# Patient Record
Sex: Female | Born: 1945
Health system: Southern US, Community
[De-identification: ages and names within clinical notes are randomized; demographics above are authoritative.]

## PROBLEM LIST (undated history)

## (undated) DIAGNOSIS — Z9889 Other specified postprocedural states: Secondary | ICD-10-CM

## (undated) DIAGNOSIS — I255 Ischemic cardiomyopathy: Secondary | ICD-10-CM

## (undated) DIAGNOSIS — I219 Acute myocardial infarction, unspecified: Secondary | ICD-10-CM

## (undated) DIAGNOSIS — E039 Hypothyroidism, unspecified: Secondary | ICD-10-CM

## (undated) DIAGNOSIS — I5189 Other ill-defined heart diseases: Secondary | ICD-10-CM

## (undated) DIAGNOSIS — C541 Malignant neoplasm of endometrium: Secondary | ICD-10-CM

## (undated) DIAGNOSIS — G4733 Obstructive sleep apnea (adult) (pediatric): Secondary | ICD-10-CM

## (undated) DIAGNOSIS — R112 Nausea with vomiting, unspecified: Secondary | ICD-10-CM

## (undated) DIAGNOSIS — C73 Malignant neoplasm of thyroid gland: Secondary | ICD-10-CM

## (undated) DIAGNOSIS — I10 Essential (primary) hypertension: Secondary | ICD-10-CM

## (undated) DIAGNOSIS — E785 Hyperlipidemia, unspecified: Secondary | ICD-10-CM

## (undated) DIAGNOSIS — Z789 Other specified health status: Secondary | ICD-10-CM

## (undated) DIAGNOSIS — I119 Hypertensive heart disease without heart failure: Secondary | ICD-10-CM

## (undated) DIAGNOSIS — E119 Type 2 diabetes mellitus without complications: Secondary | ICD-10-CM

## (undated) DIAGNOSIS — I2510 Atherosclerotic heart disease of native coronary artery without angina pectoris: Secondary | ICD-10-CM

## (undated) DIAGNOSIS — Z9989 Dependence on other enabling machines and devices: Secondary | ICD-10-CM

## (undated) DIAGNOSIS — M1990 Unspecified osteoarthritis, unspecified site: Secondary | ICD-10-CM

## (undated) DIAGNOSIS — Z780 Asymptomatic menopausal state: Secondary | ICD-10-CM

## (undated) DIAGNOSIS — F32A Depression, unspecified: Secondary | ICD-10-CM

## (undated) HISTORY — PX: ABDOMINAL HYSTERECTOMY: SHX81

## (undated) HISTORY — PX: GANGLION CYST EXCISION: SHX1691

## (undated) HISTORY — DX: Hypothyroidism, unspecified: E03.9

## (undated) HISTORY — DX: Asymptomatic menopausal state: Z78.0

## (undated) HISTORY — DX: Ischemic cardiomyopathy: I25.5

## (undated) HISTORY — DX: Atherosclerotic heart disease of native coronary artery without angina pectoris: I25.10

## (undated) HISTORY — PX: LAPAROSCOPIC HYSTERECTOMY: SHX1926

## (undated) HISTORY — PX: THYROIDECTOMY: SHX17

## (undated) HISTORY — DX: Hyperlipidemia, unspecified: E78.5

## (undated) HISTORY — DX: Dependence on other enabling machines and devices: Z99.89

## (undated) HISTORY — PX: CARDIAC CATHETERIZATION: SHX172

## (undated) HISTORY — DX: Unspecified osteoarthritis, unspecified site: M19.90

## (undated) HISTORY — DX: Essential (primary) hypertension: I10

## (undated) HISTORY — PX: CORONARY ANGIOPLASTY: SHX604

## (undated) HISTORY — DX: Hypertensive heart disease without heart failure: I11.9

## (undated) HISTORY — DX: Other specified health status: Z78.9

## (undated) HISTORY — DX: Obstructive sleep apnea (adult) (pediatric): G47.33

## (undated) HISTORY — DX: Malignant neoplasm of endometrium: C54.1

---

## 1978-03-01 HISTORY — PX: GANGLION CYST EXCISION: SHX1691

## 1993-03-01 HISTORY — PX: THYROIDECTOMY: SHX17

## 2008-04-12 ENCOUNTER — Ambulatory Visit: Payer: Self-pay | Admitting: Internal Medicine

## 2008-04-29 ENCOUNTER — Ambulatory Visit: Payer: Self-pay | Admitting: Internal Medicine

## 2009-07-02 ENCOUNTER — Ambulatory Visit: Payer: Self-pay | Admitting: Internal Medicine

## 2012-12-26 ENCOUNTER — Ambulatory Visit: Payer: Self-pay | Admitting: Family Medicine

## 2012-12-28 ENCOUNTER — Ambulatory Visit: Payer: Self-pay | Admitting: Gynecologic Oncology

## 2013-01-09 ENCOUNTER — Ambulatory Visit: Payer: Self-pay | Admitting: Gynecologic Oncology

## 2013-01-29 ENCOUNTER — Ambulatory Visit: Payer: Self-pay | Admitting: Gynecologic Oncology

## 2013-02-06 ENCOUNTER — Ambulatory Visit: Payer: Self-pay | Admitting: Obstetrics and Gynecology

## 2013-02-06 LAB — CBC
MCHC: 33.9 g/dL (ref 32.0–36.0)
MCV: 91 fL (ref 80–100)
RBC: 3.93 10*6/uL (ref 3.80–5.20)
WBC: 9.8 10*3/uL (ref 3.6–11.0)

## 2013-02-06 LAB — BASIC METABOLIC PANEL
Anion Gap: 4 — ABNORMAL LOW (ref 7–16)
Chloride: 105 mmol/L (ref 98–107)
EGFR (Non-African Amer.): 51 — ABNORMAL LOW
Osmolality: 282 (ref 275–301)
Sodium: 138 mmol/L (ref 136–145)

## 2013-02-13 ENCOUNTER — Inpatient Hospital Stay: Payer: Self-pay | Admitting: Gynecologic Oncology

## 2013-02-14 LAB — HEMOGLOBIN: HGB: 11.6 g/dL — ABNORMAL LOW (ref 12.0–16.0)

## 2013-03-01 ENCOUNTER — Ambulatory Visit: Payer: Self-pay | Admitting: Gynecologic Oncology

## 2013-08-14 DIAGNOSIS — I2109 ST elevation (STEMI) myocardial infarction involving other coronary artery of anterior wall: Secondary | ICD-10-CM

## 2013-08-14 HISTORY — PX: CORONARY ANGIOPLASTY WITH STENT PLACEMENT: SHX49

## 2013-08-14 HISTORY — DX: ST elevation (STEMI) myocardial infarction involving other coronary artery of anterior wall: I21.09

## 2013-08-30 ENCOUNTER — Encounter: Payer: Self-pay | Admitting: Cardiovascular Disease

## 2013-08-30 ENCOUNTER — Ambulatory Visit (INDEPENDENT_AMBULATORY_CARE_PROVIDER_SITE_OTHER): Payer: Medicare Other | Admitting: Cardiovascular Disease

## 2013-08-30 VITALS — BP 110/68 | HR 46 | Ht 66.5 in | Wt 294.0 lb

## 2013-08-30 DIAGNOSIS — Z9989 Dependence on other enabling machines and devices: Secondary | ICD-10-CM

## 2013-08-30 DIAGNOSIS — I2109 ST elevation (STEMI) myocardial infarction involving other coronary artery of anterior wall: Secondary | ICD-10-CM

## 2013-08-30 DIAGNOSIS — E1121 Type 2 diabetes mellitus with diabetic nephropathy: Secondary | ICD-10-CM | POA: Insufficient documentation

## 2013-08-30 DIAGNOSIS — Z9861 Coronary angioplasty status: Secondary | ICD-10-CM

## 2013-08-30 DIAGNOSIS — E1165 Type 2 diabetes mellitus with hyperglycemia: Secondary | ICD-10-CM

## 2013-08-30 DIAGNOSIS — Z955 Presence of coronary angioplasty implant and graft: Secondary | ICD-10-CM | POA: Insufficient documentation

## 2013-08-30 DIAGNOSIS — E1169 Type 2 diabetes mellitus with other specified complication: Secondary | ICD-10-CM | POA: Insufficient documentation

## 2013-08-30 DIAGNOSIS — I2510 Atherosclerotic heart disease of native coronary artery without angina pectoris: Secondary | ICD-10-CM | POA: Insufficient documentation

## 2013-08-30 DIAGNOSIS — IMO0002 Reserved for concepts with insufficient information to code with codable children: Secondary | ICD-10-CM

## 2013-08-30 DIAGNOSIS — I252 Old myocardial infarction: Secondary | ICD-10-CM | POA: Insufficient documentation

## 2013-08-30 DIAGNOSIS — E118 Type 2 diabetes mellitus with unspecified complications: Secondary | ICD-10-CM

## 2013-08-30 DIAGNOSIS — G4733 Obstructive sleep apnea (adult) (pediatric): Secondary | ICD-10-CM | POA: Insufficient documentation

## 2013-08-30 DIAGNOSIS — E785 Hyperlipidemia, unspecified: Secondary | ICD-10-CM

## 2013-08-30 MED ORDER — METOPROLOL TARTRATE 25 MG PO TABS: 25.0000 mg | ORAL_TABLET | Freq: Two times a day (BID) | ORAL | Status: DC

## 2013-08-30 MED ORDER — ATORVASTATIN CALCIUM 20 MG PO TABS: 20.0000 mg | ORAL_TABLET | Freq: Every day | ORAL | Status: DC

## 2013-08-30 MED ORDER — PRASUGREL HCL 10 MG PO TABS: 10.0000 mg | ORAL_TABLET | Freq: Every day | ORAL | Status: DC

## 2013-08-30 NOTE — Assessment & Plan Note (Signed)
We have encouraged continued exercise, careful diet management in an effort to lose weight. 

## 2013-08-30 NOTE — Assessment & Plan Note (Addendum)
She is interested in generic alternatives. We will start Lipitor 20 g daily, suggested she hold her Crestor when this runs out. Goal LDL less than 70

## 2013-08-30 NOTE — Assessment & Plan Note (Addendum)
Occluded proximal LAD, status post DES. We'll continue aspirin and effient She has bradycardia on today's visit. Heart rate 47 beats per minute. We will decrease her metoprolol down to 12.5 mg twice a day

## 2013-08-30 NOTE — Assessment & Plan Note (Addendum)
We have placed an order for her to enroll in cardiac rehabilitation. Dietary guide has been given today

## 2013-08-30 NOTE — Progress Notes (Addendum)
Patient ID: [name], female    DOB: 1945-12-01, 68 y.o.   MRN: [id number]  HPI Comments: [name] is a pleasant 68 year old woman with history of type 2 diabetes, obesity, obstructive sleep apnea on CPAP, depression, hyperlipidemia who was recently on vacation heading to [place] when she developed bilateral shoulder pain, nausea vomiting with shortness of breath. She presented to a local hospital and was transferred to [facility] in [place] with ST elevation MI. She presents to establish care in the [place] office.  She was taken to the cardiac catheterization lab 08/14/2013 and found to have occluded proximal LAD, no significant disease of her RCA and mid circumflex. She had a 3.5 x 23 mm DES placed. Ejection fraction at that time was 45%. He was discharged on aspirin, effient, beta blocker and statin.  EKG shows sinus bradycardia with T wave abnormality in the anterior precordial leads, 1 and aVL   Outpatient Encounter Prescriptions as of 08/30/2013  Medication Sig  . aspirin 81 MG tablet Take 81 mg by mouth daily.  Marland Kitchen FLUoxetine (PROZAC) 10 MG capsule Take 10 mg by mouth daily.  . Insulin Glargine (LANTUS) 100 UNIT/ML Solostar Pen Inject 18 Units into the skin daily at 10 pm.  . levothyroxine (SYNTHROID, LEVOTHROID) 200 MCG tablet Take 200 mcg by mouth daily before breakfast.  . losartan-hydrochlorothiazide (HYZAAR) 100-25 MG per tablet Take 1 tablet by mouth daily.  . metoprolol tartrate (LOPRESSOR) 25 MG tablet Take 1 tablet (25 mg total) by mouth 2 (two) times daily.  . nitroGLYCERIN (NITROSTAT) 0.4 MG SL tablet Place 0.4 mg under the tongue every 5 (five) minutes as needed for chest pain.  . prasugrel (EFFIENT) 10 MG TABS tablet Take 1 tablet (10 mg total) by mouth daily.  . rosuvastatin (CRESTOR) 10 MG tablet Take 10 mg by mouth daily.  . sitaGLIPtin (JANUVIA) 100 MG tablet Take 100 mg by mouth daily.  . [DISCONTINUED] metoprolol tartrate (LOPRESSOR) 25 MG tablet  Take 25 mg by mouth 2 (two) times daily.  . [DISCONTINUED] prasugrel (EFFIENT) 10 MG TABS tablet Take 10 mg by mouth daily.  Marland Kitchen atorvastatin (LIPITOR) 20 MG tablet Take 1 tablet (20 mg total) by mouth daily.  . traMADol (ULTRAM) 50 MG tablet Take 1 tablet (50 mg total) by mouth every 6 (six) hours as needed.     Review of Systems  Constitutional: Negative.   HENT: Negative.   Eyes: Negative.   Respiratory: Negative.   Cardiovascular: Negative.   Gastrointestinal: Negative.   Endocrine: Negative.   Musculoskeletal: Negative.   Skin: Negative.   Allergic/Immunologic: Negative.   Neurological: Negative.   Hematological: Negative.   Psychiatric/Behavioral: Negative.   All other systems reviewed and are negative.   BP 110/68  Pulse 46  Ht 5' 6.5" (1.689 m)  Wt 294 lb (133.358 kg)  BMI 46.75 kg/m2  Physical Exam  Nursing note and vitals reviewed. Constitutional: She is oriented to person, place, and time. She appears well-developed and well-nourished.  HENT:  Head: Normocephalic.  Nose: Nose normal.  Mouth/Throat: Oropharynx is clear and moist.  Eyes: Conjunctivae are normal. Pupils are equal, round, and reactive to light.  Neck: Normal range of motion. Neck supple. No JVD present.  Cardiovascular: Normal rate, regular rhythm, S1 normal, S2 normal, normal heart sounds and intact distal pulses.  Exam reveals no gallop and no friction rub.   No murmur heard. Pulmonary/Chest: Effort normal and breath sounds normal. No respiratory distress. She has no wheezes. She  has no rales. She exhibits no tenderness.  Abdominal: Soft. Bowel sounds are normal. She exhibits no distension. There is no tenderness.  Musculoskeletal: Normal range of motion. She exhibits no edema and no tenderness.  Lymphadenopathy:    She has no cervical adenopathy.  Neurological: She is alert and oriented to person, place, and time. Coordination normal.  Skin: Skin is warm and dry. No rash noted. No erythema.   Psychiatric: She has a normal mood and affect. Her behavior is normal. Judgment and thought content normal.    Assessment and Plan

## 2013-08-30 NOTE — Assessment & Plan Note (Signed)
3.5 x 23 mm DES to the proximal LAD.

## 2013-08-30 NOTE — Assessment & Plan Note (Signed)
Encouraged her to stay on her CPAP

## 2013-08-30 NOTE — Assessment & Plan Note (Signed)
Peak troponin was 98.4. Does not appear that she had echocardiogram done while in the hospital. [facility] consider echocardiogram in followup establish baseline ejection fraction.

## 2013-08-30 NOTE — Patient Instructions (Signed)
You are doing well. Please hold the crestor Start atorvastatin 1/2 pill once a day for a few weeks, then a full pill if no side effects  Please cut the metoprolol in 1/2 twice a day  We will help set up cardiac rehab at [facility]  Please call us if you have new issues that need to be addressed before your next appt.  Your physician wants you to follow-up in: 3 months.  You will receive a reminder letter in the mail two months in advance. If you don't receive a letter, please call our office to schedule the follow-up appointment.

## 2013-09-03 ENCOUNTER — Other Ambulatory Visit: Payer: Self-pay

## 2013-09-04 ENCOUNTER — Encounter: Payer: Self-pay | Admitting: Cardiovascular Disease

## 2013-09-04 ENCOUNTER — Other Ambulatory Visit: Payer: Self-pay | Admitting: *Deleted

## 2013-09-04 MED ORDER — PRASUGREL HCL 10 MG PO TABS: 10.0000 mg | ORAL_TABLET | Freq: Every day | ORAL | Status: DC

## 2013-09-04 MED ORDER — METOPROLOL TARTRATE 25 MG PO TABS: 25.0000 mg | ORAL_TABLET | Freq: Two times a day (BID) | ORAL | Status: DC

## 2013-09-05 ENCOUNTER — Other Ambulatory Visit: Payer: Self-pay

## 2013-09-06 ENCOUNTER — Telehealth: Payer: Self-pay

## 2013-09-06 NOTE — Telephone Encounter (Signed)
Spoke w/ pt.  She reports that at her ov 08/30/13, she was told that rx for tramadol would be sent in for her until she could obtain a new PCP, as Dr. Ellene Route is leaving.  Advised her that there is no documentation of this, but would speak w/ Dr. Rockey Situ.  Please advise.  Thank you.

## 2013-09-07 MED ORDER — TRAMADOL HCL 50 MG PO TABS: 50.0000 mg | ORAL_TABLET | Freq: Four times a day (QID) | ORAL | Status: DC | PRN

## 2013-09-07 NOTE — Telephone Encounter (Signed)
We can provide a prescription for several weeks' time until she is able to get established with primary care At which point they should take over the prescription #60, 0 refills Take q6 prn for pain Would document where her pain is I seem to remember it was back and knee pain

## 2013-09-07 NOTE — Telephone Encounter (Signed)
Spoke w/ pt.  She is very [employer].  Reports she has an appt next month w/ new PCP.  She will call if we can be of further assistance.

## 2013-09-29 ENCOUNTER — Encounter: Payer: Self-pay | Admitting: Cardiovascular Disease

## 2013-10-30 ENCOUNTER — Encounter: Payer: Self-pay | Admitting: Cardiovascular Disease

## 2013-11-30 ENCOUNTER — Telehealth: Payer: Self-pay

## 2013-11-30 ENCOUNTER — Encounter: Payer: Self-pay | Admitting: Cardiovascular Disease

## 2013-11-30 ENCOUNTER — Ambulatory Visit (INDEPENDENT_AMBULATORY_CARE_PROVIDER_SITE_OTHER): Payer: Medicare Other | Admitting: Cardiovascular Disease

## 2013-11-30 VITALS — BP 108/68 | HR 50 | Ht 67.5 in | Wt 283.5 lb

## 2013-11-30 DIAGNOSIS — E1165 Type 2 diabetes mellitus with hyperglycemia: Secondary | ICD-10-CM

## 2013-11-30 DIAGNOSIS — I2102 ST elevation (STEMI) myocardial infarction involving left anterior descending coronary artery: Secondary | ICD-10-CM

## 2013-11-30 DIAGNOSIS — E785 Hyperlipidemia, unspecified: Secondary | ICD-10-CM

## 2013-11-30 DIAGNOSIS — Z9989 Dependence on other enabling machines and devices: Secondary | ICD-10-CM

## 2013-11-30 DIAGNOSIS — I2510 Atherosclerotic heart disease of native coronary artery without angina pectoris: Secondary | ICD-10-CM

## 2013-11-30 DIAGNOSIS — E118 Type 2 diabetes mellitus with unspecified complications: Secondary | ICD-10-CM

## 2013-11-30 DIAGNOSIS — G4733 Obstructive sleep apnea (adult) (pediatric): Secondary | ICD-10-CM

## 2013-11-30 DIAGNOSIS — Z955 Presence of coronary angioplasty implant and graft: Secondary | ICD-10-CM

## 2013-11-30 DIAGNOSIS — IMO0002 Reserved for concepts with insufficient information to code with codable children: Secondary | ICD-10-CM

## 2013-11-30 DIAGNOSIS — R0602 Shortness of breath: Secondary | ICD-10-CM

## 2013-11-30 MED ORDER — CARVEDILOL 3.125 MG PO TABS: 3.1250 mg | ORAL_TABLET | Freq: Two times a day (BID) | ORAL | Status: DC

## 2013-11-30 NOTE — Progress Notes (Signed)
Patient ID: [name], female    DOB: 1945/10/27, 68 y.o.   MRN: [id number]  HPI Comments: [name] is a pleasant 68 year old woman with history of type 2 diabetes, obesity, obstructive sleep apnea on CPAP, depression, hyperlipidemia who was recently on vacation heading to [place] when she developed bilateral shoulder pain, nausea vomiting with shortness of breath. She presented to a local hospital and was transferred to [facility] in [place] with ST elevation MI. She presents for routine followup Patient of Dr. Luan Pulling  She was taken to the cardiac catheterization lab 08/14/2013 and found to have occluded proximal LAD, no significant disease of her RCA and mid circumflex. She had a 3.5 x 23 mm DES placed. Ejection fraction at that time was 45%. He was discharged on aspirin, effient, beta blocker and statin.  In followup today she is doing well, active, participating in aerobic activity. She does "[employer]" on a regular basis Blood pressure at home typically in the 120s over 80, heart rates in the 50s She is tolerating Crestor. Most recent total cholesterol 175 On her last clinic visit, metoprolol dose was decreased down to 12.5 mg twice a day. Interestingly she reports her heart rate is higher at home. Are track also shows heart rates typically 80s to 90s range, occasionally in the 50s, likely at rest  She reports HCTZ was recently held for climb in her creatinine  EKG shows sinus bradycardia with rate 50 beats per minute with T wave abnormality in the anterior precordial leads, 1 and aVL   Outpatient Encounter Prescriptions as of 11/30/2013  Medication Sig  . aspirin 81 MG tablet Take 81 mg by mouth daily.  Marland Kitchen FLUoxetine (PROZAC) 10 MG capsule Take 10 mg by mouth daily.  . Insulin Glargine (LANTUS) 100 UNIT/ML Solostar Pen Inject 28 Units into the skin daily at 10 pm.   . levothyroxine (SYNTHROID, LEVOTHROID) 175 MCG tablet Take 175 mcg by mouth daily before breakfast.  .  losartan (COZAAR) 100 MG tablet Take 100 mg by mouth daily.  . metoprolol tartrate (LOPRESSOR) 25 MG tablet Take 12.5 mg by mouth 2 (two) times daily.  . nitroGLYCERIN (NITROSTAT) 0.4 MG SL tablet Place 0.4 mg under the tongue every 5 (five) minutes as needed for chest pain.  . prasugrel (EFFIENT) 10 MG TABS tablet Take 1 tablet (10 mg total) by mouth daily.  . rosuvastatin (CRESTOR) 10 MG tablet Take 10 mg by mouth daily.  . sitaGLIPtin (JANUVIA) 100 MG tablet Take 100 mg by mouth daily.    Review of Systems  Constitutional: Negative.   HENT: Negative.   Eyes: Negative.   Respiratory: Negative.   Cardiovascular: Negative.   Gastrointestinal: Negative.   Endocrine: Negative.   Musculoskeletal: Negative.   Skin: Negative.   Allergic/Immunologic: Negative.   Neurological: Negative.   Hematological: Negative.   Psychiatric/Behavioral: Negative.   All other systems reviewed and are negative.   BP 108/68  Pulse 50  Ht 5' 7.5" (1.715 m)  Wt 283 lb 8 oz (128.595 kg)  BMI 43.72 kg/m2  Physical Exam  Nursing note and vitals reviewed. Constitutional: She is oriented to person, place, and time. She appears well-developed and well-nourished.  HENT:  Head: Normocephalic.  Nose: Nose normal.  Mouth/Throat: Oropharynx is clear and moist.  Eyes: Conjunctivae are normal. Pupils are equal, round, and reactive to light.  Neck: Normal range of motion. Neck supple. No JVD present.  Cardiovascular: Normal rate, regular rhythm, S1 normal, S2 normal, normal  heart sounds and intact distal pulses.  Exam reveals no gallop and no friction rub.   No murmur heard. Pulmonary/Chest: Effort normal and breath sounds normal. No respiratory distress. She has no wheezes. She has no rales. She exhibits no tenderness.  Abdominal: Soft. Bowel sounds are normal. She exhibits no distension. There is no tenderness.  Musculoskeletal: Normal range of motion. She exhibits no edema and no tenderness.  Lymphadenopathy:     She has no cervical adenopathy.  Neurological: She is alert and oriented to person, place, and time. Coordination normal.  Skin: Skin is warm and dry. No rash noted. No erythema.  Psychiatric: She has a normal mood and affect. Her behavior is normal. Judgment and thought content normal.    Assessment and Plan

## 2013-11-30 NOTE — Telephone Encounter (Signed)
Faxed clearance for pt to participate in [facility].  Per Christell Faith, PA, "CAD s/p MI 03/2013, s/p PCI/DES to pLAD, HTN, OSA on CPAP, DM2, hypothyroidism, HLD, OA."

## 2013-11-30 NOTE — Patient Instructions (Signed)
You are doing well.  Please hold the metoprolol Start coreg 3.125 mg twice a day Monitor your blood pressure and heart rate  Goal total cholesterol is <150 LDL <70  Will will schedule an echo for hx of heart attack  Please call us if you have new issues that need to be addressed before your next appt.  Your physician wants you to follow-up in: 12 months.  You will receive a reminder letter in the mail two months in advance. If you don't receive a letter, please call our office to schedule the follow-up appointment.

## 2013-12-02 NOTE — Assessment & Plan Note (Signed)
Recommended that she stay on her aspirin and effient. Samples provided today

## 2013-12-02 NOTE — Assessment & Plan Note (Addendum)
She will follow up with Dr. Luan Pulling for routine blood work Goal LDL less than 70. May need higher Crestor dosing.

## 2013-12-02 NOTE — Assessment & Plan Note (Addendum)
Currently with no symptoms of angina. No further workup at this time. Continue current medication regimen. In discussions with Dr. Luan Pulling, the suggestion of changing to carvedilol was may. This is certainly a good option and we will hold her metoprolol, start carvedilol 3.125 mg twice a day with slow titration upwards as tolerated. Significant bradycardia in supine but better heart rates at rehabilitation

## 2013-12-02 NOTE — Assessment & Plan Note (Signed)
Stressed the importance of daily walking as she is doing, weight loss

## 2013-12-02 NOTE — Assessment & Plan Note (Signed)
We have encouraged continued exercise, careful diet management in an effort to lose weight. 

## 2013-12-02 NOTE — Assessment & Plan Note (Signed)
This was not discussed with her today. Prior notes indicating she is on CPAP

## 2013-12-14 ENCOUNTER — Other Ambulatory Visit: Payer: Self-pay

## 2013-12-14 ENCOUNTER — Other Ambulatory Visit (INDEPENDENT_AMBULATORY_CARE_PROVIDER_SITE_OTHER): Payer: Medicare Other

## 2013-12-14 DIAGNOSIS — I2102 ST elevation (STEMI) myocardial infarction involving left anterior descending coronary artery: Secondary | ICD-10-CM

## 2013-12-14 DIAGNOSIS — R0602 Shortness of breath: Secondary | ICD-10-CM

## 2014-03-26 DIAGNOSIS — E890 Postprocedural hypothyroidism: Secondary | ICD-10-CM | POA: Diagnosis not present

## 2014-03-26 DIAGNOSIS — I10 Essential (primary) hypertension: Secondary | ICD-10-CM | POA: Diagnosis not present

## 2014-03-26 DIAGNOSIS — E119 Type 2 diabetes mellitus without complications: Secondary | ICD-10-CM | POA: Diagnosis not present

## 2014-03-26 DIAGNOSIS — F329 Major depressive disorder, single episode, unspecified: Secondary | ICD-10-CM | POA: Diagnosis not present

## 2014-03-26 DIAGNOSIS — M179 Osteoarthritis of knee, unspecified: Secondary | ICD-10-CM | POA: Diagnosis not present

## 2014-03-26 LAB — HEMOGLOBIN A1C: Hgb A1c MFr Bld: 7.5 % — AB (ref 4.0–6.0)

## 2014-03-26 LAB — TSH: TSH: 1.1 u[IU]/mL (ref <=5.90)

## 2014-03-28 DIAGNOSIS — I10 Essential (primary) hypertension: Secondary | ICD-10-CM | POA: Diagnosis not present

## 2014-03-28 DIAGNOSIS — E890 Postprocedural hypothyroidism: Secondary | ICD-10-CM | POA: Diagnosis not present

## 2014-04-02 DIAGNOSIS — M25561 Pain in right knee: Secondary | ICD-10-CM | POA: Diagnosis not present

## 2014-04-05 DIAGNOSIS — M25561 Pain in right knee: Secondary | ICD-10-CM | POA: Diagnosis not present

## 2014-04-10 DIAGNOSIS — M25561 Pain in right knee: Secondary | ICD-10-CM | POA: Diagnosis not present

## 2014-04-12 DIAGNOSIS — M25561 Pain in right knee: Secondary | ICD-10-CM | POA: Diagnosis not present

## 2014-04-17 DIAGNOSIS — M25561 Pain in right knee: Secondary | ICD-10-CM | POA: Diagnosis not present

## 2014-04-19 DIAGNOSIS — M25561 Pain in right knee: Secondary | ICD-10-CM | POA: Diagnosis not present

## 2014-04-22 DIAGNOSIS — M25561 Pain in right knee: Secondary | ICD-10-CM | POA: Diagnosis not present

## 2014-05-07 DIAGNOSIS — M25561 Pain in right knee: Secondary | ICD-10-CM | POA: Diagnosis not present

## 2014-05-10 DIAGNOSIS — M25561 Pain in right knee: Secondary | ICD-10-CM | POA: Diagnosis not present

## 2014-05-14 DIAGNOSIS — M25561 Pain in right knee: Secondary | ICD-10-CM | POA: Diagnosis not present

## 2014-05-16 DIAGNOSIS — M25561 Pain in right knee: Secondary | ICD-10-CM | POA: Diagnosis not present

## 2014-06-21 NOTE — Op Note (Signed)
PATIENT NAME:  [name], [name] MR#:  [id number] DATE OF BIRTH:  06-Jun-1945  DATE OF PROCEDURE:  02/13/2013  SURGEON: Jacquelyne Balint, MD.  PREOPERATIVE DIAGNOSIS: Adenocarcinoma of the endometrium.   POSTOPERATIVE DIAGNOSIS: Adenocarcinoma of the endometrium, probably stage I.  PROCEDURE PERFORMED: Laparoscopy with robotic assistance for total abdominal hysterectomy with bilateral salpingo-oophorectomy.   ANESTHESIA: General.   ESTIMATED BLOOD LOSS: 150 mL.   COMPLICATIONS: None.   INDICATION FOR SURGERY: Ms. [name] is a 69 year old patient, who presented with postmenopausal bleeding and on evaluation was noted to have a well-differentiated endometrium adenocarcinoma. Treatment options were discussed and the decision was made to proceed with surgery.   FINDINGS AT TIME OF SURGERY: Normal external genitalia, urethral meatus, urethra, bladder and vagina. Cervix without lesion. Uterus slightly enlarged for age. Normal adnexa. Moderate amount of adhesions in the pelvis. No evidence of the pelvic or abdominal spread. Surgery complicated by the patient's morbid obesity.   OPERATIVE REPORT: After adequate general anesthesia had been obtained, the patient was prepped and draped in ski position. The cervix was visualized, grasped with a single-tooth tenaculum and dilated. A holding stitch was placed. Then the V-Care was inserted into the uterus and around the vagina. A Foley catheter was placed.   Attention was then directed towards the abdomen. A 1 cm incision was made above the umbilicus. The fascia was identified and transected. The peritoneum was identified and entered. Then, the blunt trocar was inserted into the abdominal cavity. Pneumoperitoneum was obtained and inspection revealed the above-mentioned findings. Then under direct vision, two robotic trocars were inserted into the mid abdomen and another one in the left lower quadrant. The assistant port was placed in the right lower quadrant  and a Versa Step trocar was used. The patient was placed in steep Trendelenburg position and the bowel was pushed away from the pelvis. This was only done incompletely due to adhesions and a very thick sigmoid mesentery. The patient was then attached to the robot.   Pelvic cytology was obtained. Then the adhesions between the colon and the pelvic sidewall were lysed. First, the pelvic sidewall was entered on the left side. The round ligament was cauterized and transected. The infundibulopelvic ligament was identified and mobilized. Vessels and ureter were inspected. Then the infundibulopelvic ligament was cauterized and the adnexa were removed close to the uterus. The same procedure was performed on the contralateral side. Then the anterior fold of the peritoneum was incised and the bladder was freed from lower uterine segment, cervix and upper vagina. The posterior peritoneum was incised. Work in the cul-de-sac was difficult due to the thick mesentery. Retraction was necessary with a fan retractor at all times. Then the uterine vessels were identified, cauterized and transected on either side. It was difficult to find the vaginal ring, which finally was possible after adequate dissection had been carried around the entire cervix. The vagina was entered posteriorly and incision was carried all the way around it. Then uterus, tubes and ovaries could be removed through the vagina. The vaginal apex was closed with a running V-Loc suture as well as an interrupted figure-of-eight stitch on the left pelvic vaginal angle. After several more vessels were cauterized,  adequate hemostasis in the pelvis was obtained. Again, the right and the left sidewalls were explored for bleeding.   In view of her morbid obesity and the very thick sigmoid mesentery, decision was made not to proceed with lymphadenectomy. Arista was placed over the areas of dissection. The patient was detached from  the robot. All ports were removed under  direct vision. The camera port was closed with an interrupted figure-of-eight stitch using 0 Vicryl. The subcutaneous tissue of all port sites was reapproximated with 3-0 Vicryl and the skin was closed with Dermabond. 15 mL of Marcaine was injected around the port site.   The patient tolerated the procedure well and was taken to the recovery room in stable condition. Postoperative urine was clear. Lap, sponge, needle, and instrument counts were correct x 2.   ____________________________ Weber Cooks, MD bem:aw D: 02/14/2013 09:55:41 ET T: 02/14/2013 11:12:23 ET JOB#: [id number]  cc: Weber Cooks, MD, <Dictator> Weber Cooks MD ELECTRONICALLY SIGNED 02/20/2013 12:04

## 2014-07-05 DIAGNOSIS — Z008 Encounter for other general examination: Secondary | ICD-10-CM | POA: Diagnosis not present

## 2014-07-11 DIAGNOSIS — I129 Hypertensive chronic kidney disease with stage 1 through stage 4 chronic kidney disease, or unspecified chronic kidney disease: Secondary | ICD-10-CM | POA: Insufficient documentation

## 2014-07-11 DIAGNOSIS — Z8542 Personal history of malignant neoplasm of other parts of uterus: Secondary | ICD-10-CM | POA: Insufficient documentation

## 2014-07-11 DIAGNOSIS — C541 Malignant neoplasm of endometrium: Secondary | ICD-10-CM

## 2014-07-11 DIAGNOSIS — E890 Postprocedural hypothyroidism: Secondary | ICD-10-CM | POA: Insufficient documentation

## 2014-07-11 DIAGNOSIS — N182 Chronic kidney disease, stage 2 (mild): Secondary | ICD-10-CM

## 2014-07-11 DIAGNOSIS — M1710 Unilateral primary osteoarthritis, unspecified knee: Secondary | ICD-10-CM | POA: Insufficient documentation

## 2014-07-11 DIAGNOSIS — M179 Osteoarthritis of knee, unspecified: Secondary | ICD-10-CM | POA: Insufficient documentation

## 2014-07-11 DIAGNOSIS — I2510 Atherosclerotic heart disease of native coronary artery without angina pectoris: Secondary | ICD-10-CM | POA: Insufficient documentation

## 2014-07-11 DIAGNOSIS — F3340 Major depressive disorder, recurrent, in remission, unspecified: Secondary | ICD-10-CM | POA: Insufficient documentation

## 2014-07-11 DIAGNOSIS — M792 Neuralgia and neuritis, unspecified: Secondary | ICD-10-CM | POA: Insufficient documentation

## 2014-08-01 DIAGNOSIS — E890 Postprocedural hypothyroidism: Secondary | ICD-10-CM | POA: Diagnosis not present

## 2014-08-01 DIAGNOSIS — E119 Type 2 diabetes mellitus without complications: Secondary | ICD-10-CM | POA: Diagnosis not present

## 2014-08-02 ENCOUNTER — Ambulatory Visit: Payer: Self-pay | Admitting: Family Medicine

## 2014-08-08 ENCOUNTER — Encounter: Payer: Self-pay | Admitting: Family Medicine

## 2014-08-08 ENCOUNTER — Ambulatory Visit (INDEPENDENT_AMBULATORY_CARE_PROVIDER_SITE_OTHER): Payer: Medicare Other | Admitting: Family Medicine

## 2014-08-08 VITALS — BP 136/72 | HR 56 | Temp 98.1°F | Resp 16 | Ht 67.0 in | Wt 278.0 lb

## 2014-08-08 DIAGNOSIS — N183 Chronic kidney disease, stage 3 unspecified: Secondary | ICD-10-CM | POA: Insufficient documentation

## 2014-08-08 DIAGNOSIS — M1712 Unilateral primary osteoarthritis, left knee: Secondary | ICD-10-CM

## 2014-08-08 DIAGNOSIS — IMO0002 Reserved for concepts with insufficient information to code with codable children: Secondary | ICD-10-CM

## 2014-08-08 DIAGNOSIS — E1165 Type 2 diabetes mellitus with hyperglycemia: Secondary | ICD-10-CM

## 2014-08-08 DIAGNOSIS — I10 Essential (primary) hypertension: Secondary | ICD-10-CM

## 2014-08-08 DIAGNOSIS — N1831 Chronic kidney disease, stage 3a: Secondary | ICD-10-CM

## 2014-08-08 DIAGNOSIS — E785 Hyperlipidemia, unspecified: Secondary | ICD-10-CM | POA: Diagnosis not present

## 2014-08-08 DIAGNOSIS — N182 Chronic kidney disease, stage 2 (mild): Secondary | ICD-10-CM

## 2014-08-08 DIAGNOSIS — E118 Type 2 diabetes mellitus with unspecified complications: Secondary | ICD-10-CM

## 2014-08-08 DIAGNOSIS — E890 Postprocedural hypothyroidism: Secondary | ICD-10-CM | POA: Diagnosis not present

## 2014-08-08 MED ORDER — METFORMIN HCL ER 500 MG PO TB24: 500.0000 mg | ORAL_TABLET | Freq: Every day | ORAL | Status: DC

## 2014-08-08 MED ORDER — TRAMADOL HCL 50 MG PO TABS: 50.0000 mg | ORAL_TABLET | Freq: Every day | ORAL | Status: DC | PRN

## 2014-08-08 MED ORDER — INSULIN GLARGINE 100 UNIT/ML SOLOSTAR PEN: 42.0000 [IU] | PEN_INJECTOR | Freq: Every day | SUBCUTANEOUS | Status: DC

## 2014-08-08 NOTE — Assessment & Plan Note (Signed)
Pt counseled to avoid NSAID's. Monitor GFR closely with addition of Metformin. Pt remains on ACE for renal protection.

## 2014-08-08 NOTE — Assessment & Plan Note (Signed)
A1c increased from 7.5% to 8.0%. Plan to increase Lantus to 42 units nightly and titrate up.  Add back metformin 500mg  once daily to help decrease A1c. Monitor kidney function very closely and d/c if GFR is < 45.  Increase exercise and make diet changes.

## 2014-08-08 NOTE — Assessment & Plan Note (Addendum)
TSH WNL. Continue current dose of synthroid. Recheck in 3 mos.

## 2014-08-08 NOTE — Assessment & Plan Note (Signed)
Encouraged continued exercise. Recommend water aerobics 3 days per week to help with knee issues.   Resources from [email or web address] given to help with food choices.  Goal weight loss is 10 lbs by next visit.

## 2014-08-08 NOTE — Patient Instructions (Addendum)
DM: Increase Lantus to 42 units nightly can titrate up to 2 units for FBG > 150.  Stop at 50 units. Increase exercise: suggest water aerobics or gentle walking. Watch sugar intake and portion control.  We are restarting low dose metformin, we will watch you kidney function closely.  Kidneys: AVOID Advil, ibuprofen, aleve, etc.  Arthritis: Consider Capsaicin cream to the knee for pain or tart cherry juice supplements. These will help with pain and not interact with any medications.

## 2014-08-08 NOTE — Assessment & Plan Note (Signed)
PRN tramadol renewed today. Pt has completed PT but they released her until after she gets a knee replacement. Pt is willing to undergo evaluation for knee replacement after she reaches the 1 year mark on her MI.  Advised capsaicin cream and tart cherry juice as complementary treatment for her OSA of the knee.

## 2014-08-08 NOTE — Progress Notes (Signed)
Subjective:    Patient ID: [name], female    DOB: 20-Sep-1945, 69 y.o.   MRN: [id number]  HPI: [name] is a 69 y.o. female presenting on 08/08/2014 for Diabetes; Hypertension; Hypothyroidism; Arthritis; and Chronic Kidney Disease  Pt presents for chronic condition follow-up. Labs were done prior to her visit. Her kidney function has been fluctuating between 45-50 GFR. She endorses some use of NSAIDs in the recent past.  Hypothyroidism is post-operative. She is doing well on current dose of synthroid. Denies cold intolerance, fatigue, or weight gain.   Diabetes She presents for her follow-up diabetic visit. She has type 2 diabetes mellitus. Her disease course has been worsening. Pertinent negatives for hypoglycemia include no confusion, dizziness or headaches. Pertinent negatives for diabetes include no chest pain, no fatigue, no polydipsia, no polyphagia, no polyuria, no visual change and no weakness. There are no hypoglycemic complications. Diabetic complications include heart disease and nephropathy. Risk factors for coronary artery disease include diabetes mellitus and dyslipidemia. Her weight is stable. She is following a generally healthy diet. Meal planning includes avoidance of concentrated sweets. She rarely participates in exercise. Her breakfast blood glucose range is generally 130-140 mg/dl. Her overall blood glucose range is 130-140 mg/dl. An ACE inhibitor/angiotensin II receptor blocker is being taken. She does not see a podiatrist.Eye exam is not current.  Hypertension This is a chronic problem. The current episode started more than 1 year ago. The problem is unchanged. Pertinent negatives include no chest pain or headaches. Risk factors for coronary artery disease include dyslipidemia and diabetes mellitus. Past treatments include angiotensin blockers. The current treatment provides moderate improvement. Compliance problems include exercise.  Hypertensive end-organ damage  includes kidney disease and CAD/MI.  Arthritis Presents for follow-up visit. She complains of pain, stiffness and joint swelling. Affected locations include the left knee. Pertinent negatives include no diarrhea, dysuria, fatigue or fever. Her pertinent risk factors include overuse. Past treatments include acetaminophen, rest and exercise. Compliance with prior treatments has been good.  Pt does endorse rare exercise due to her arthritis.    She is mostly compliant with the diabetic diet but endorses she could work on her portion sizing.    Past Medical History  Diagnosis Date  . Coronary artery disease   . Hypertension   . Obstructive sleep apnea on CPAP   . Diabetes mellitus, type II   . Arthritis     knees   . Menopause   . Hypothyroid   . Hyperlipidemia   . Osteoarthritis   . Acute anterolateral wall MI 2015  . Endometrial cancer   . Gravida 3 para 3     Current Outpatient Prescriptions on File Prior to Visit  Medication Sig  . acetaminophen (TYLENOL) 500 MG tablet Take 2 tablets by mouth 3 (three) times daily as needed.  Marland Kitchen aspirin 81 MG tablet Take 81 mg by mouth daily.  . carvedilol (COREG) 3.125 MG tablet Take 1 tablet (3.125 mg total) by mouth 2 (two) times daily.  Marland Kitchen FLUoxetine (PROZAC) 10 MG capsule Take 10 mg by mouth daily.  Marland Kitchen glucose blood test strip 1 strip 3 (three) times daily.  Marland Kitchen levothyroxine (SYNTHROID, LEVOTHROID) 175 MCG tablet Take 175 mcg by mouth daily before breakfast.  . losartan (COZAAR) 100 MG tablet Take 100 mg by mouth daily.  . nitroGLYCERIN (NITROSTAT) 0.4 MG SL tablet Place 0.4 mg under the tongue every 5 (five) minutes as needed for chest pain.  . prasugrel (EFFIENT) 10 MG TABS tablet  Take 1 tablet (10 mg total) by mouth daily.  . rosuvastatin (CRESTOR) 10 MG tablet Take 10 mg by mouth daily.  . sitaGLIPtin (JANUVIA) 100 MG tablet Take 100 mg by mouth daily.   No current facility-administered medications on file prior to visit.    Review of  Systems  Constitutional: Negative for fever, chills and fatigue.  HENT: Negative.   Respiratory: Negative for cough, chest tightness and wheezing.   Cardiovascular: Negative for chest pain and leg swelling.  Gastrointestinal: Negative for nausea, vomiting, abdominal pain, diarrhea and constipation.  Endocrine: Negative.  Negative for cold intolerance, heat intolerance, polydipsia, polyphagia and polyuria.  Genitourinary: Negative for dysuria and difficulty urinating.  Musculoskeletal: Positive for joint swelling, arthralgias, arthritis and stiffness.  Neurological: Negative for dizziness, weakness, light-headedness, numbness and headaches.  Psychiatric/Behavioral: Negative.  Negative for confusion.   Per HPI unless specifically indicated above     Objective:    BP 136/72 mmHg  Pulse 56  Temp(Src) 98.1 F (36.7 C) (Oral)  Resp 16  Ht 5\' 7"  (1.702 m)  Wt 278 lb (126.1 kg)  BMI 43.53 kg/m2  Wt Readings from Last 3 Encounters:  08/08/14 278 lb (126.1 kg)  07/05/14 278 lb 2.1 oz (126.159 kg)  11/30/13 283 lb 8 oz (128.595 kg)    Physical Exam  Constitutional: She is oriented to person, place, and time. She appears well-developed and well-nourished. No distress.  Neck: Normal range of motion. Neck supple. No thyromegaly present.  Cardiovascular: Normal rate and regular rhythm.  Exam reveals no gallop and no friction rub.   No murmur heard. Pulmonary/Chest: Effort normal and breath sounds normal.  Abdominal: Soft. Bowel sounds are normal. There is no tenderness. There is no rebound.  Musculoskeletal: She exhibits no edema.       Left knee: She exhibits decreased range of motion. She exhibits no swelling and no erythema. Tenderness found. Medial joint line and lateral joint line tenderness noted.  Lymphadenopathy:    She has no cervical adenopathy.  Neurological: She is alert and oriented to person, place, and time.  Skin: Skin is warm and dry. She is not diaphoretic.   Results  for orders placed or performed in visit on 07/11/14  Hemoglobin A1c  Result Value Ref Range   Hgb A1c MFr Bld 7.5 (A) 4.0 - 6.0 %  TSH  Result Value Ref Range   TSH 1.10 .41 - 5.90 uIU/mL      Assessment & Plan:   Problem List Items Addressed This Visit      Cardiovascular and Mediastinum   Essential (primary) hypertension     Endocrine   Diabetes mellitus type 2 with complications, uncontrolled - Primary    A1c increased from 7.5% to 8.0%. Plan to increase Lantus to 42 units nightly and titrate up.  Add back metformin 500mg  once daily to help decrease A1c. Monitor kidney function very closely and d/c if GFR is < 45.  Increase exercise and make diet changes.       Relevant Medications   metFORMIN (GLUCOPHAGE XR) 500 MG 24 hr tablet   Insulin Glargine (LANTUS) 100 UNIT/ML Solostar Pen   Hypothyroidism, postop    TSH WNL. Continue current dose of synthroid. Recheck in 3 mos.         Musculoskeletal and Integument   Arthritis of knee, degenerative    PRN tramadol renewed today. Pt has completed PT but they released her until after she gets a knee replacement. Pt is willing to undergo  evaluation for knee replacement after she reaches the 1 year mark on her MI.  Advised capsaicin cream and tart cherry juice as complementary treatment for her OSA of the knee.       Relevant Medications   traMADol (ULTRAM) 50 MG tablet     Genitourinary   CKD stage G3a/A1, GFR 45 - 59 and albumin creatinine ratio <30 mg/g    Pt counseled to avoid NSAID's. Monitor GFR closely with addition of Metformin. Pt remains on ACE for renal protection.         Other   Morbid obesity    Encouraged continued exercise. Recommend water aerobics 3 days per week to help with knee issues.   Resources from [email or web address] given to help with food choices.  Goal weight loss is 10 lbs by next visit.       Relevant Medications   metFORMIN (GLUCOPHAGE XR) 500 MG 24 hr tablet   Insulin Glargine (LANTUS) 100  UNIT/ML Solostar Pen   Hyperlipidemia      Meds ordered this encounter  Medications  . metFORMIN (GLUCOPHAGE XR) 500 MG 24 hr tablet    Sig: Take 1 tablet (500 mg total) by mouth daily with breakfast.    Dispense:  30 tablet    Refill:  11    Order Specific Question:  Supervising Provider    Answer:  Arlis Porta [phone]  . traMADol (ULTRAM) 50 MG tablet    Sig: Take 1 tablet (50 mg total) by mouth daily as needed.    Dispense:  30 tablet    Refill:  1    Order Specific Question:  Supervising Provider    Answer:  Arlis Porta [phone]  . Insulin Glargine (LANTUS) 100 UNIT/ML Solostar Pen    Sig: Inject 42 Units into the skin at bedtime. Can titrate 2 units as needed for fasting BG >150. Stop at 50 units nightly.    Dispense:  5 pen    Refill:  11      Follow up plan: Return in about 3 months (around 11/08/2014) for DM.

## 2014-08-21 ENCOUNTER — Other Ambulatory Visit: Payer: Self-pay | Admitting: Family Medicine

## 2014-08-21 DIAGNOSIS — E785 Hyperlipidemia, unspecified: Secondary | ICD-10-CM

## 2014-08-21 MED ORDER — ROSUVASTATIN CALCIUM 10 MG PO TABS: 10.0000 mg | ORAL_TABLET | Freq: Every day | ORAL | Status: DC

## 2014-09-06 ENCOUNTER — Telehealth: Payer: Self-pay | Admitting: Family Medicine

## 2014-09-06 MED ORDER — SITAGLIPTIN PHOSPHATE 100 MG PO TABS: 100.0000 mg | ORAL_TABLET | Freq: Every day | ORAL | Status: DC

## 2014-09-06 NOTE — Telephone Encounter (Signed)
Pt.notified

## 2014-09-06 NOTE — Telephone Encounter (Signed)
I refilled this medication and sent it to her pharmacy. Not sure why we never received the fax from [facility]

## 2014-09-06 NOTE — Telephone Encounter (Signed)
I don't see in her medication list can we refill this please suggest [facility]

## 2014-09-06 NOTE — Telephone Encounter (Signed)
Ms. Gassner called saying her pharmacy [facility]) told her they faxed a request to refill her Januvia 100 (sp?) last week and they've received no response from [place]. The pt said she's completely out and needs a refill. Please call pt if needed. Pt ph# [phone] Thank you.

## 2014-09-25 ENCOUNTER — Other Ambulatory Visit: Payer: Self-pay | Admitting: Cardiovascular Disease

## 2014-10-21 ENCOUNTER — Other Ambulatory Visit: Payer: Self-pay | Admitting: Family Medicine

## 2014-11-11 DIAGNOSIS — Z794 Long term (current) use of insulin: Secondary | ICD-10-CM | POA: Diagnosis not present

## 2014-11-11 DIAGNOSIS — E890 Postprocedural hypothyroidism: Secondary | ICD-10-CM | POA: Diagnosis not present

## 2014-11-11 DIAGNOSIS — J329 Chronic sinusitis, unspecified: Secondary | ICD-10-CM | POA: Diagnosis not present

## 2014-11-11 DIAGNOSIS — Z85850 Personal history of malignant neoplasm of thyroid: Secondary | ICD-10-CM | POA: Diagnosis not present

## 2014-11-11 DIAGNOSIS — G4733 Obstructive sleep apnea (adult) (pediatric): Secondary | ICD-10-CM | POA: Diagnosis not present

## 2014-11-11 DIAGNOSIS — R001 Bradycardia, unspecified: Secondary | ICD-10-CM | POA: Diagnosis not present

## 2014-11-11 DIAGNOSIS — Z7982 Long term (current) use of aspirin: Secondary | ICD-10-CM | POA: Diagnosis not present

## 2014-11-11 DIAGNOSIS — R51 Headache: Secondary | ICD-10-CM | POA: Diagnosis not present

## 2014-11-11 DIAGNOSIS — E876 Hypokalemia: Secondary | ICD-10-CM | POA: Diagnosis not present

## 2014-11-11 DIAGNOSIS — E785 Hyperlipidemia, unspecified: Secondary | ICD-10-CM | POA: Diagnosis not present

## 2014-11-11 DIAGNOSIS — I2510 Atherosclerotic heart disease of native coronary artery without angina pectoris: Secondary | ICD-10-CM | POA: Diagnosis not present

## 2014-11-11 DIAGNOSIS — F329 Major depressive disorder, single episode, unspecified: Secondary | ICD-10-CM | POA: Diagnosis not present

## 2014-11-11 DIAGNOSIS — I252 Old myocardial infarction: Secondary | ICD-10-CM | POA: Diagnosis not present

## 2014-11-11 DIAGNOSIS — R471 Dysarthria and anarthria: Secondary | ICD-10-CM | POA: Diagnosis not present

## 2014-11-11 DIAGNOSIS — R110 Nausea: Secondary | ICD-10-CM | POA: Diagnosis not present

## 2014-11-11 DIAGNOSIS — Z79899 Other long term (current) drug therapy: Secondary | ICD-10-CM | POA: Diagnosis not present

## 2014-11-11 DIAGNOSIS — R4781 Slurred speech: Secondary | ICD-10-CM | POA: Diagnosis not present

## 2014-11-11 DIAGNOSIS — I639 Cerebral infarction, unspecified: Secondary | ICD-10-CM | POA: Diagnosis not present

## 2014-11-11 DIAGNOSIS — I071 Rheumatic tricuspid insufficiency: Secondary | ICD-10-CM | POA: Diagnosis not present

## 2014-11-11 DIAGNOSIS — G459 Transient cerebral ischemic attack, unspecified: Secondary | ICD-10-CM | POA: Diagnosis not present

## 2014-11-11 DIAGNOSIS — I361 Nonrheumatic tricuspid (valve) insufficiency: Secondary | ICD-10-CM | POA: Diagnosis not present

## 2014-11-11 DIAGNOSIS — Z923 Personal history of irradiation: Secondary | ICD-10-CM | POA: Diagnosis not present

## 2014-11-11 DIAGNOSIS — R29810 Facial weakness: Secondary | ICD-10-CM | POA: Diagnosis not present

## 2014-11-11 DIAGNOSIS — I6521 Occlusion and stenosis of right carotid artery: Secondary | ICD-10-CM | POA: Diagnosis not present

## 2014-11-11 DIAGNOSIS — I517 Cardiomegaly: Secondary | ICD-10-CM | POA: Diagnosis not present

## 2014-11-11 DIAGNOSIS — Z955 Presence of coronary angioplasty implant and graft: Secondary | ICD-10-CM | POA: Diagnosis not present

## 2014-11-11 DIAGNOSIS — E039 Hypothyroidism, unspecified: Secondary | ICD-10-CM | POA: Diagnosis not present

## 2014-11-11 DIAGNOSIS — I10 Essential (primary) hypertension: Secondary | ICD-10-CM | POA: Diagnosis not present

## 2014-11-11 DIAGNOSIS — E119 Type 2 diabetes mellitus without complications: Secondary | ICD-10-CM | POA: Diagnosis not present

## 2014-11-12 DIAGNOSIS — E039 Hypothyroidism, unspecified: Secondary | ICD-10-CM | POA: Diagnosis not present

## 2014-11-12 DIAGNOSIS — R4781 Slurred speech: Secondary | ICD-10-CM | POA: Diagnosis not present

## 2014-11-12 DIAGNOSIS — R29810 Facial weakness: Secondary | ICD-10-CM | POA: Diagnosis not present

## 2014-11-12 DIAGNOSIS — R51 Headache: Secondary | ICD-10-CM | POA: Diagnosis not present

## 2014-11-12 DIAGNOSIS — I2510 Atherosclerotic heart disease of native coronary artery without angina pectoris: Secondary | ICD-10-CM | POA: Diagnosis not present

## 2014-11-12 DIAGNOSIS — I6521 Occlusion and stenosis of right carotid artery: Secondary | ICD-10-CM | POA: Diagnosis not present

## 2014-11-19 ENCOUNTER — Ambulatory Visit (INDEPENDENT_AMBULATORY_CARE_PROVIDER_SITE_OTHER): Payer: Medicare Other | Admitting: Family Medicine

## 2014-11-19 ENCOUNTER — Encounter: Payer: Self-pay | Admitting: Family Medicine

## 2014-11-19 VITALS — BP 122/81 | HR 56 | Temp 98.4°F | Resp 16 | Ht 67.0 in | Wt 272.0 lb

## 2014-11-19 DIAGNOSIS — E785 Hyperlipidemia, unspecified: Secondary | ICD-10-CM | POA: Diagnosis not present

## 2014-11-19 DIAGNOSIS — E890 Postprocedural hypothyroidism: Secondary | ICD-10-CM

## 2014-11-19 DIAGNOSIS — Z23 Encounter for immunization: Secondary | ICD-10-CM | POA: Diagnosis not present

## 2014-11-19 DIAGNOSIS — Z955 Presence of coronary angioplasty implant and graft: Secondary | ICD-10-CM

## 2014-11-19 DIAGNOSIS — IMO0002 Reserved for concepts with insufficient information to code with codable children: Secondary | ICD-10-CM

## 2014-11-19 DIAGNOSIS — I10 Essential (primary) hypertension: Secondary | ICD-10-CM

## 2014-11-19 DIAGNOSIS — I2101 ST elevation (STEMI) myocardial infarction involving left main coronary artery: Secondary | ICD-10-CM

## 2014-11-19 DIAGNOSIS — E118 Type 2 diabetes mellitus with unspecified complications: Secondary | ICD-10-CM

## 2014-11-19 DIAGNOSIS — E1165 Type 2 diabetes mellitus with hyperglycemia: Secondary | ICD-10-CM

## 2014-11-19 DIAGNOSIS — G451 Carotid artery syndrome (hemispheric): Secondary | ICD-10-CM

## 2014-11-19 NOTE — Patient Instructions (Signed)
Continue all of your current meds.  No flying until cleared by Vascular surgery

## 2014-11-19 NOTE — Progress Notes (Signed)
Name: [name]   MRN: [id number]    DOB: Jul 16, 1945   Date:11/19/2014       Progress Note  Subjective  Chief Complaint  Chief Complaint  Patient presents with  . Hospitalization Follow-up    mini stroke while on plan 11/11/2014 taken to ER at [place]    HPI Here for f/u of TIA in [place] last week.  No CT evidence of bleed or infartion.  [place] of carotids showed R carotid with 50-69% stenosis.  She is feeling well at this time.  No more HA.  Reported that she had had some L. Facial droop and L. Sided weakness, but this was minimal.    She hs not seen Dr. Rockey Situ in about 1 year.  No problem-specific assessment & plan notes found for this encounter.   Past Medical History  Diagnosis Date  . Coronary artery disease   . Hypertension   . Obstructive sleep apnea on CPAP   . Diabetes mellitus, type II   . Arthritis     knees   . Menopause   . Hypothyroid   . Hyperlipidemia   . Osteoarthritis   . Acute anterolateral wall MI 2015  . Endometrial cancer   . Gravida 3 para 3     Social History  Substance Use Topics  . Smoking status: Never Smoker   . Smokeless tobacco: Never Used  . Alcohol Use: No     Current outpatient prescriptions:  .  acetaminophen (TYLENOL) 500 MG tablet, Take 2 tablets by mouth 3 (three) times daily as needed., Disp: , Rfl:  .  aspirin 81 MG tablet, Take 81 mg by mouth daily., Disp: , Rfl:  .  B-D ULTRAFINE III SHORT PEN 31G X 8 MM MISC, USE UTD, Disp: , Rfl: 0 .  carvedilol (COREG) 3.125 MG tablet, Take 1 tablet (3.125 mg total) by mouth 2 (two) times daily., Disp: 60 tablet, Rfl: 11 .  EFFIENT 10 MG TABS tablet, TAKE ONE TABLET BY MOUTH EVERY DAY, Disp: 30 tablet, Rfl: 3 .  FLUoxetine (PROZAC) 10 MG capsule, Take 10 mg by mouth daily., Disp: , Rfl:  .  glucose blood test strip, 1 strip 3 (three) times daily., Disp: , Rfl:  .  Insulin Glargine (LANTUS) 100 UNIT/ML Solostar Pen, Inject 42 Units into the skin at bedtime. Can titrate 2 units as needed  for fasting BG >150. Stop at 50 units nightly., Disp: 5 pen, Rfl: 11 .  levothyroxine (SYNTHROID, LEVOTHROID) 175 MCG tablet, TAKE 1 TABLET BY MOUTH EVERY DAY, Disp: 30 tablet, Rfl: 0 .  losartan (COZAAR) 100 MG tablet, Take 100 mg by mouth daily., Disp: , Rfl:  .  metFORMIN (GLUCOPHAGE XR) 500 MG 24 hr tablet, Take 1 tablet (500 mg total) by mouth daily with breakfast., Disp: 30 tablet, Rfl: 11 .  nitroGLYCERIN (NITROSTAT) 0.4 MG SL tablet, Place 0.4 mg under the tongue every 5 (five) minutes as needed for chest pain., Disp: , Rfl:  .  rosuvastatin (CRESTOR) 10 MG tablet, Take 1 tablet (10 mg total) by mouth daily., Disp: 90 tablet, Rfl: 3 .  sitaGLIPtin (JANUVIA) 100 MG tablet, Take 1 tablet (100 mg total) by mouth daily., Disp: 90 tablet, Rfl: 3 .  traMADol (ULTRAM) 50 MG tablet, Take 1 tablet (50 mg total) by mouth daily as needed., Disp: 30 tablet, Rfl: 1  No Known Allergies  Review of Systems  Constitutional: Negative for fever, chills and malaise/fatigue.  Eyes: Negative for blurred vision and double vision.  Respiratory: Negative for cough, sputum production, shortness of breath and wheezing.   Cardiovascular: Negative for chest pain, palpitations, orthopnea and leg swelling.  Gastrointestinal: Negative for heartburn, nausea, vomiting, abdominal pain and diarrhea.  Genitourinary: Negative for dysuria, urgency and frequency.  Musculoskeletal: Negative for myalgias and joint pain.  Neurological: Positive for headaches ( minor now.). Negative for dizziness, tingling, sensory change, focal weakness and weakness.  Psychiatric/Behavioral: The patient has insomnia.       Objective  Filed Vitals:   11/19/14 1310  BP: 122/81  Pulse: 56  Temp: 98.4 F (36.9 C)  TempSrc: Oral  Resp: 16  Height: 5\' 7"  (1.702 m)  Weight: 272 lb (123.378 kg)     Physical Exam  Constitutional: She is oriented to person, place, and time and well-developed, well-nourished, and in no distress. No  distress.  HENT:  Head: Normocephalic and atraumatic.  Eyes: Conjunctivae and EOM are normal. Pupils are equal, round, and reactive to light.  Neck: Normal range of motion. Neck supple. Carotid bruit is not present. No thyromegaly present.  Cardiovascular: Regular rhythm, normal heart sounds and intact distal pulses.  Exam reveals no gallop and no friction rub.   No murmur heard. Pulmonary/Chest: Effort normal and breath sounds normal. No respiratory distress. She has no wheezes. She has no rales.  Abdominal: Soft. She exhibits no distension and no mass. There is no tenderness.  Musculoskeletal: She exhibits no edema.  Lymphadenopathy:    She has no cervical adenopathy.  Neurological: She is alert and oriented to person, place, and time. No cranial nerve deficit. Coordination normal.  Psychiatric: Mood, memory, affect and judgment normal.  Vitals reviewed.     No results found for this or any previous visit (from the past 2160 hour(s)).   Assessment & Plan  1. TIA involving carotid artery  - Ambulatory referral to Vascular Surgery  2. Essential (primary) hypertension  - Comprehensive Metabolic Panel (CMET)  3. ST elevation myocardial infarction involving left main coronary artery   4. Diabetes mellitus type 2 with complications, uncontrolled  - HgB A1c  5. Hypothyroidism, postop  - TSH  6. History of coronary artery stent placement  - Ambulatory referral to Cardiology  7. Hyperlipidemia  - Lipid Profile  8. Need for influenza vaccination  - Flu vaccine HIGH DOSE PF (Fluzone High dose)

## 2014-11-21 DIAGNOSIS — E785 Hyperlipidemia, unspecified: Secondary | ICD-10-CM | POA: Diagnosis not present

## 2014-11-21 DIAGNOSIS — E118 Type 2 diabetes mellitus with unspecified complications: Secondary | ICD-10-CM | POA: Diagnosis not present

## 2014-11-21 DIAGNOSIS — E890 Postprocedural hypothyroidism: Secondary | ICD-10-CM | POA: Diagnosis not present

## 2014-11-21 DIAGNOSIS — I10 Essential (primary) hypertension: Secondary | ICD-10-CM | POA: Diagnosis not present

## 2014-11-22 LAB — LIPID PANEL
CHOL/HDL RATIO: 2.9 ratio (ref 0.0–4.4)
Cholesterol, Total: 160 mg/dL (ref 100–199)
HDL: 56 mg/dL (ref >39)
LDL Calculated: 82 mg/dL (ref 0–99)
Triglycerides: 112 mg/dL (ref 0–149)
VLDL Cholesterol Cal: 22 mg/dL (ref 5–40)

## 2014-11-22 LAB — COMPREHENSIVE METABOLIC PANEL
ALBUMIN: 3.9 g/dL (ref 3.6–4.8)
ALT: 13 IU/L (ref 0–32)
AST: 13 IU/L (ref 0–40)
Albumin/Globulin Ratio: 1.4 (ref 1.1–2.5)
Alkaline Phosphatase: 73 IU/L (ref 39–117)
BUN / CREAT RATIO: 22 (ref 11–26)
BUN: 21 mg/dL (ref 8–27)
Bilirubin Total: 0.3 mg/dL (ref 0.0–1.2)
CHLORIDE: 101 mmol/L (ref 97–108)
CO2: 25 mmol/L (ref 18–29)
Calcium: 8.9 mg/dL (ref 8.7–10.3)
Creatinine, Ser: 0.97 mg/dL (ref 0.57–1.00)
GFR calc non Af Amer: 60 mL/min/{1.73_m2} (ref >59)
GFR, EST AFRICAN AMERICAN: 69 mL/min/{1.73_m2} (ref >59)
Globulin, Total: 2.7 g/dL (ref 1.5–4.5)
Glucose: 123 mg/dL — ABNORMAL HIGH (ref 65–99)
Potassium: 4.5 mmol/L (ref 3.5–5.2)
SODIUM: 139 mmol/L (ref 134–144)
TOTAL PROTEIN: 6.6 g/dL (ref 6.0–8.5)

## 2014-11-22 LAB — TSH: TSH: 0.702 u[IU]/mL (ref 0.450–4.500)

## 2014-11-22 LAB — HEMOGLOBIN A1C
ESTIMATED AVERAGE GLUCOSE: 169 mg/dL
HEMOGLOBIN A1C: 7.5 % — AB (ref 4.8–5.6)

## 2014-11-25 ENCOUNTER — Telehealth: Payer: Self-pay | Admitting: Family Medicine

## 2014-11-25 NOTE — Telephone Encounter (Signed)
Pt return call from  Friday please call pt

## 2014-11-26 DIAGNOSIS — E785 Hyperlipidemia, unspecified: Secondary | ICD-10-CM | POA: Diagnosis not present

## 2014-11-26 DIAGNOSIS — I6529 Occlusion and stenosis of unspecified carotid artery: Secondary | ICD-10-CM | POA: Diagnosis not present

## 2014-11-26 DIAGNOSIS — I2510 Atherosclerotic heart disease of native coronary artery without angina pectoris: Secondary | ICD-10-CM | POA: Diagnosis not present

## 2014-11-26 DIAGNOSIS — I10 Essential (primary) hypertension: Secondary | ICD-10-CM | POA: Diagnosis not present

## 2014-11-26 DIAGNOSIS — G459 Transient cerebral ischemic attack, unspecified: Secondary | ICD-10-CM | POA: Diagnosis not present

## 2014-11-29 ENCOUNTER — Other Ambulatory Visit: Payer: Self-pay

## 2014-11-29 ENCOUNTER — Other Ambulatory Visit: Payer: Self-pay | Admitting: Family Medicine

## 2014-11-29 DIAGNOSIS — E038 Other specified hypothyroidism: Secondary | ICD-10-CM

## 2014-11-29 MED ORDER — LEVOTHYROXINE SODIUM 175 MCG PO TABS: 175.0000 ug | ORAL_TABLET | Freq: Every day | ORAL | Status: DC

## 2014-12-07 ENCOUNTER — Other Ambulatory Visit: Payer: Self-pay | Admitting: Family Medicine

## 2014-12-10 ENCOUNTER — Ambulatory Visit: Payer: Self-pay | Admitting: Nurse Practitioner

## 2014-12-24 ENCOUNTER — Other Ambulatory Visit: Payer: Self-pay | Admitting: Cardiovascular Disease

## 2015-01-03 DIAGNOSIS — E119 Type 2 diabetes mellitus without complications: Secondary | ICD-10-CM | POA: Diagnosis not present

## 2015-01-06 LAB — HM DIABETES EYE EXAM

## 2015-01-10 ENCOUNTER — Encounter: Payer: Self-pay | Admitting: Family Medicine

## 2015-01-14 ENCOUNTER — Encounter: Payer: Self-pay | Admitting: Family Medicine

## 2015-01-16 ENCOUNTER — Telehealth: Payer: Self-pay | Admitting: Cardiovascular Disease

## 2015-01-16 NOTE — Telephone Encounter (Signed)
3 attempts to schedule from recall.  LMOV to schedule with office  Deleting recall.

## 2015-01-17 ENCOUNTER — Ambulatory Visit: Payer: Medicare Other | Admitting: Family Medicine

## 2015-01-17 ENCOUNTER — Telehealth: Payer: Self-pay | Admitting: *Deleted

## 2015-01-17 ENCOUNTER — Ambulatory Visit: Payer: Self-pay | Admitting: Nurse Practitioner

## 2015-01-17 NOTE — Telephone Encounter (Signed)
Called patient in regards to no-show today. Cardiology also had called x3 to schedudle f/u.

## 2015-01-18 ENCOUNTER — Other Ambulatory Visit: Payer: Self-pay | Admitting: Family Medicine

## 2015-01-20 NOTE — Telephone Encounter (Signed)
Tramadol is a controlled medication. She must have office visit for prescription. Effient should be prescribed by cardiology.

## 2015-01-25 ENCOUNTER — Other Ambulatory Visit: Payer: Self-pay | Admitting: Cardiovascular Disease

## 2015-02-03 DIAGNOSIS — B029 Zoster without complications: Secondary | ICD-10-CM | POA: Diagnosis not present

## 2015-02-04 ENCOUNTER — Other Ambulatory Visit: Payer: Self-pay | Admitting: Cardiovascular Disease

## 2015-02-25 ENCOUNTER — Encounter: Payer: Self-pay | Admitting: *Deleted

## 2015-02-25 ENCOUNTER — Ambulatory Visit: Payer: Self-pay | Admitting: Nurse Practitioner

## 2015-03-03 ENCOUNTER — Other Ambulatory Visit: Payer: Self-pay | Admitting: Cardiovascular Disease

## 2015-03-09 ENCOUNTER — Other Ambulatory Visit: Payer: Self-pay | Admitting: Family Medicine

## 2015-04-16 ENCOUNTER — Telehealth: Payer: Self-pay

## 2015-04-16 ENCOUNTER — Other Ambulatory Visit: Payer: Self-pay | Admitting: Family Medicine

## 2015-04-16 MED ORDER — PRASUGREL HCL 10 MG PO TABS: 10.0000 mg | ORAL_TABLET | Freq: Every day | ORAL | Status: DC

## 2015-04-16 NOTE — Telephone Encounter (Signed)
LMOM to have patient call regarding a refill on Effient and need to schedule a follow up appointment.

## 2015-04-17 ENCOUNTER — Other Ambulatory Visit: Payer: Self-pay | Admitting: Family Medicine

## 2015-04-17 MED ORDER — FLUOXETINE HCL 10 MG PO CAPS: 10.0000 mg | ORAL_CAPSULE | Freq: Every day | ORAL | Status: DC

## 2015-06-10 DIAGNOSIS — H18831 Recurrent erosion of cornea, right eye: Secondary | ICD-10-CM | POA: Diagnosis not present

## 2015-06-11 DIAGNOSIS — H18831 Recurrent erosion of cornea, right eye: Secondary | ICD-10-CM | POA: Diagnosis not present

## 2015-06-12 DIAGNOSIS — H18831 Recurrent erosion of cornea, right eye: Secondary | ICD-10-CM | POA: Diagnosis not present

## 2015-06-14 ENCOUNTER — Other Ambulatory Visit: Payer: Self-pay | Admitting: Cardiovascular Disease

## 2015-06-14 ENCOUNTER — Other Ambulatory Visit: Payer: Self-pay | Admitting: Family Medicine

## 2015-06-16 ENCOUNTER — Telehealth: Payer: Self-pay | Admitting: Cardiovascular Disease

## 2015-06-16 NOTE — Telephone Encounter (Signed)
lmov for patient to call back and schedule an appointment with Dr Rockey Situ She is in need due to pt needs refills  Sent in one refill but after that we can not send anymore.

## 2015-06-17 DIAGNOSIS — H18831 Recurrent erosion of cornea, right eye: Secondary | ICD-10-CM | POA: Diagnosis not present

## 2015-06-24 ENCOUNTER — Other Ambulatory Visit: Payer: Self-pay | Admitting: Family Medicine

## 2015-06-24 MED ORDER — LEVOTHYROXINE SODIUM 175 MCG PO TABS: 175.0000 ug | ORAL_TABLET | Freq: Every day | ORAL | Status: DC

## 2015-06-26 ENCOUNTER — Encounter: Payer: Self-pay | Admitting: Nurse Practitioner

## 2015-06-26 ENCOUNTER — Ambulatory Visit (INDEPENDENT_AMBULATORY_CARE_PROVIDER_SITE_OTHER): Payer: Medicare Other | Admitting: Nurse Practitioner

## 2015-06-26 VITALS — BP 130/70 | HR 60 | Ht 67.0 in | Wt 277.1 lb

## 2015-06-26 DIAGNOSIS — E1159 Type 2 diabetes mellitus with other circulatory complications: Secondary | ICD-10-CM | POA: Diagnosis not present

## 2015-06-26 DIAGNOSIS — R0602 Shortness of breath: Secondary | ICD-10-CM

## 2015-06-26 DIAGNOSIS — I119 Hypertensive heart disease without heart failure: Secondary | ICD-10-CM | POA: Diagnosis not present

## 2015-06-26 DIAGNOSIS — E039 Hypothyroidism, unspecified: Secondary | ICD-10-CM

## 2015-06-26 DIAGNOSIS — I255 Ischemic cardiomyopathy: Secondary | ICD-10-CM | POA: Diagnosis not present

## 2015-06-26 DIAGNOSIS — I25118 Atherosclerotic heart disease of native coronary artery with other forms of angina pectoris: Secondary | ICD-10-CM | POA: Insufficient documentation

## 2015-06-26 DIAGNOSIS — I2510 Atherosclerotic heart disease of native coronary artery without angina pectoris: Secondary | ICD-10-CM

## 2015-06-26 NOTE — Patient Instructions (Addendum)
Medication Instructions:   STOP TAKING EFFIENT ONCE YOUR CURRENT REFILL IS DONE    If you need a refill on your cardiac medications before your next appointment, please call your pharmacy.  Labwork:  NONE ORDER TODAY    Testing/Procedures:  Your physician has requested that you have an echocardiogram. Echocardiography is a painless test that uses sound waves to create images of your heart. It provides your doctor with information about the size and shape of your heart and how well your heart's chambers and valves are working. This procedure takes approximately one hour. There are no restrictions for this procedure.    Follow-Up: Your physician wants you to follow-up in:  [place] will receive a reminder letter in the mail two months in advance. If you don't receive a letter, please call our office to schedule the follow-up appointment.       Any Other Special Instructions Will Be Listed Below (If Applicable).

## 2015-06-26 NOTE — Progress Notes (Signed)
Office Visit    Patient Name: [name] Date of Encounter: 06/26/2015  Primary Care Provider:  Dicky Doe, MD Primary Cardiologist:  Johnny Bridge, MD   Chief Complaint    70 year old female with a history of anterior STEMI status post LAD stenting in 2015 who presents for follow-up.  Past Medical History    Past Medical History  Diagnosis Date  . Coronary artery disease     a. 07/2013 STEMI/PCI ([place]): LM nl, LAD 100p (thrombectomy & 3.5x23 [place] DES), LCX nl, RCA nl, EF 45%.  . Hypertensive heart disease   . Obstructive sleep apnea on CPAP   . Diabetes mellitus, type II ([place])   . Arthritis     knees   . Menopause   . Hypothyroid   . Hyperlipidemia   . Osteoarthritis   . Endometrial cancer ([place])   . Gravida 3 para 3   . Ischemic cardiomyopathy     a. 07/2013 EF 45% @ time of STEMI; b. 11/2013 Echo: EF 55-60%, mildly dil LA, nl RV fxn.   Past Surgical History  Procedure Laterality Date  . Cardiac catheterization    . Laparoscopic hysterectomy    . Ganglion cyst excision    . Thyroidectomy    . Coronary angioplasty      drug eluting stent placed at [facility]  . Abdominal hysterectomy      Allergies  No Known Allergies  History of Present Illness    70 year old female with the above, plus past medical history. In June 2015, she was treated for an anterior ST elevation MI in [place] with some stressful thrombectomy and drug-eluting stent placement to the LAD. EF at that time was 45% however subsequent echocardiogram in October 2015 showed normalization of LV function, at 55-60%. She also has a history of hypertension, hyperlipidemia, diabetes, and morbid obesity. She has not been seen since 2015, missing her appointment last November in the setting of her son becoming ill and going on hospice at that time. He eventually passed in December 2016. Despite that, she has been doing reasonably well from a cardiac standpoint. She notes that she  doesn't exercise but also does not experience any chest pain or pressure. She does have some degree of chronic dyspnea on exertion which she thinks may be worse in the past year, as her activity levels have declined. She denies PND, orthopnea, dizziness, syncope, palpitations, edema, or early satiety.  Home Medications    Prior to Admission medications   Medication Sig Start Date End Date Taking? Authorizing Provider  acetaminophen (TYLENOL) 500 MG tablet Take 2 tablets by mouth 3 (three) times daily as needed. 03/26/14  Yes Historical Provider, MD  aspirin 81 MG tablet Take 81 mg by mouth daily.   Yes Historical Provider, MD  B-D ULTRAFINE III SHORT PEN 31G X 8 MM MISC USE AS DIRECTED 06/16/15  Yes Arlis Porta., MD  carvedilol (COREG) 3.125 MG tablet TAKE 1 TABLET BY MOUTH TWICE DAILY. 01/27/15  Yes Minna Merritts, MD  FLUoxetine (PROZAC) 10 MG capsule TAKE ONE CAPSULE BY MOUTH DAILY( NEED OFFICE APPOINTMENT TO CONTINUE GETTING MEDICATION) 04/17/15  Yes Arlis Porta., MD  glucose blood test strip 1 strip 3 (three) times daily. 07/04/14  Yes Amy Overton Mam, NP  Insulin Glargine (LANTUS) 100 UNIT/ML Solostar Pen Inject 42 Units into the skin at bedtime. Can titrate 2 units as needed for fasting BG >150. Stop at 50 units  nightly. 08/08/14  Yes Amy Overton Mam, NP  levothyroxine (SYNTHROID, LEVOTHROID) 175 MCG tablet Take 1 tablet (175 mcg total) by mouth daily before breakfast. 06/24/15  Yes Arlis Porta., MD  losartan (COZAAR) 100 MG tablet TAKE 1 TABLET BY MOUTH DAILY. 12/09/14  Yes Arlis Porta., MD  metFORMIN (GLUCOPHAGE XR) 500 MG 24 hr tablet Take 1 tablet (500 mg total) by mouth daily with breakfast. 08/08/14  Yes Amy Overton Mam, NP  nitroGLYCERIN (NITROSTAT) 0.4 MG SL tablet Place 0.4 mg under the tongue every 5 (five) minutes as needed for chest pain.   Yes Historical Provider, MD  rosuvastatin (CRESTOR) 10 MG tablet Take 1 tablet (10 mg total) by mouth daily. 08/21/14   Yes Amy Overton Mam, NP  sitaGLIPtin (JANUVIA) 100 MG tablet Take 1 tablet (100 mg total) by mouth daily. 09/06/14  Yes Amy Overton Mam, NP    Review of Systems    As above, she does have some degree of chronic dyspnea on exertion which is slightly worse in the past year. She denies chest pain, palpitations, PND, orthopnea, dizziness, syncope, edema, or early satiety.  All other systems reviewed and are otherwise negative except as noted above.  Physical Exam    VS:  BP 130/70 mmHg  Pulse 60  Ht 5\' 7"  (1.702 m)  Wt 277 lb 1.9 oz (125.701 kg)  BMI 43.39 kg/m2  SpO2 97% , BMI Body mass index is 43.39 kg/(m^2). GEN: Well nourished, well developed, in no acute distress. HEENT: normal. Neck: Supple, no JVD, carotid bruits, or masses. Cardiac: RRR, no murmurs, rubs, or gallops. No clubbing, cyanosis, edema.  Radials/DP/PT 2+ and equal bilaterally.  Respiratory:  Respirations regular and unlabored, clear to auscultation bilaterally. GI: Soft, nontender, nondistended, BS + x 4. MS: no deformity or atrophy. Skin: warm and dry, no rash. Neuro:  Strength and sensation are intact. Psych: Normal affect.  Accessory Clinical Findings    ECG - Regular sinus rhythm, 60, right axis, prior septal and lateral infarcts  Assessment & Plan    1.  Coronary artery disease: Status post prior anterior myocardial infarction with drug eluting stent placement to the LAD in 2015. She has done well without chest pain or pressure though she does have some degree of dyspnea on exertion, which is chronic but slightly worse. She does not exercise regularly. She feels that overall, she has been doing well. I will follow-up a 2-D echocardiogram to evaluate her LV function, which was normal in 2015. She remains on aspirin, Effient, statin, beta blocker, and ARB therapy. She would like to come off of her Effient. It has been almost 2 years since her stent was placed. We did discuss data regarding utilization of lower dose  brilinta for out to 3 years post MI. She is not interested. She plans to finish the bottle of Effient that she has and then discontinue. She no strip remained on aspirin going forward.  2. Hypertensive heart disease: Stable on beta blocker and ARB therapy.  3. Hyperlipidemia: She remains on statin therapy and this is followed by her primary care provider.  4. Type 2 diabetes mellitus: She is treated with metformin and Januvia, and this is followed by her primary care provider.  5. Morbid obesity: I suspect deconditioning is playing a role in her dyspnea. I recommended that she increase her activity and get regular exercise. 6.   6. Dyspnea on exertion: As above, this is chronic but slightly worse in the past  year. She is euvolemic on exam today. I will repeat echocardiogram given prior history of anterior MI with LV dysfunction at that time. That said, last echo in October 2015 showed normal LV function.  7. Disposition: Follow-up echocardiogram. Follow-up in 6 months or sooner if necessary.  Murray Hodgkins, NP 06/26/2015, 11:45 AM

## 2015-06-30 ENCOUNTER — Other Ambulatory Visit: Payer: Self-pay | Admitting: Family Medicine

## 2015-07-04 DIAGNOSIS — H40003 Preglaucoma, unspecified, bilateral: Secondary | ICD-10-CM | POA: Diagnosis not present

## 2015-07-04 DIAGNOSIS — H40009 Preglaucoma, unspecified, unspecified eye: Secondary | ICD-10-CM | POA: Diagnosis not present

## 2015-07-11 DIAGNOSIS — H40003 Preglaucoma, unspecified, bilateral: Secondary | ICD-10-CM | POA: Diagnosis not present

## 2015-07-14 ENCOUNTER — Other Ambulatory Visit: Payer: Self-pay

## 2015-07-22 ENCOUNTER — Other Ambulatory Visit: Payer: Self-pay | Admitting: Family Medicine

## 2015-07-29 ENCOUNTER — Ambulatory Visit (INDEPENDENT_AMBULATORY_CARE_PROVIDER_SITE_OTHER): Payer: Medicare Other

## 2015-07-29 ENCOUNTER — Other Ambulatory Visit: Payer: Self-pay

## 2015-07-29 DIAGNOSIS — R0602 Shortness of breath: Secondary | ICD-10-CM

## 2015-08-23 ENCOUNTER — Other Ambulatory Visit: Payer: Self-pay | Admitting: Family Medicine

## 2015-08-25 NOTE — Telephone Encounter (Signed)
Patient made appt with Amy on 09/03/15 but is in need of 1 refill on lantus solostar.

## 2015-09-03 ENCOUNTER — Ambulatory Visit (INDEPENDENT_AMBULATORY_CARE_PROVIDER_SITE_OTHER): Payer: Medicare Other | Admitting: Family Medicine

## 2015-09-03 VITALS — BP 138/84 | HR 51 | Temp 98.1°F | Resp 16 | Ht 67.0 in | Wt 277.2 lb

## 2015-09-03 DIAGNOSIS — E118 Type 2 diabetes mellitus with unspecified complications: Secondary | ICD-10-CM

## 2015-09-03 DIAGNOSIS — E785 Hyperlipidemia, unspecified: Secondary | ICD-10-CM

## 2015-09-03 DIAGNOSIS — Z794 Long term (current) use of insulin: Secondary | ICD-10-CM

## 2015-09-03 DIAGNOSIS — E1165 Type 2 diabetes mellitus with hyperglycemia: Secondary | ICD-10-CM | POA: Diagnosis not present

## 2015-09-03 DIAGNOSIS — E038 Other specified hypothyroidism: Secondary | ICD-10-CM | POA: Diagnosis not present

## 2015-09-03 DIAGNOSIS — I2510 Atherosclerotic heart disease of native coronary artery without angina pectoris: Secondary | ICD-10-CM | POA: Diagnosis not present

## 2015-09-03 DIAGNOSIS — I10 Essential (primary) hypertension: Secondary | ICD-10-CM | POA: Diagnosis not present

## 2015-09-03 DIAGNOSIS — E034 Atrophy of thyroid (acquired): Secondary | ICD-10-CM

## 2015-09-03 DIAGNOSIS — IMO0002 Reserved for concepts with insufficient information to code with codable children: Secondary | ICD-10-CM

## 2015-09-03 LAB — COMPLETE METABOLIC PANEL WITH GFR
ALT: 11 U/L (ref 6–29)
AST: 12 U/L (ref 10–35)
Albumin: 3.8 g/dL (ref 3.6–5.1)
Alkaline Phosphatase: 60 U/L (ref 33–130)
BUN: 20 mg/dL (ref 7–25)
CALCIUM: 9 mg/dL (ref 8.6–10.4)
CHLORIDE: 105 mmol/L (ref 98–110)
CO2: 27 mmol/L (ref 20–31)
CREATININE: 1.08 mg/dL — AB (ref 0.60–0.93)
GFR, EST AFRICAN AMERICAN: 60 mL/min (ref >=60)
GFR, EST NON AFRICAN AMERICAN: 52 mL/min — AB (ref >=60)
Glucose, Bld: 96 mg/dL (ref 65–99)
POTASSIUM: 4.6 mmol/L (ref 3.5–5.3)
Sodium: 141 mmol/L (ref 135–146)
Total Bilirubin: 0.4 mg/dL (ref 0.2–1.2)
Total Protein: 7.1 g/dL (ref 6.1–8.1)

## 2015-09-03 LAB — LIPID PANEL
Cholesterol: 166 mg/dL (ref 125–200)
HDL: 61 mg/dL (ref >=46)
LDL CALC: 79 mg/dL (ref <130)
TRIGLYCERIDES: 129 mg/dL (ref <150)
Total CHOL/HDL Ratio: 2.7 Ratio (ref <=5.0)
VLDL: 26 mg/dL (ref <30)

## 2015-09-03 LAB — POCT GLYCOSYLATED HEMOGLOBIN (HGB A1C): HEMOGLOBIN A1C: 8.4

## 2015-09-03 LAB — TSH: TSH: 0.14 m[IU]/L — AB

## 2015-09-03 MED ORDER — NITROGLYCERIN 0.4 MG SL SUBL: 0.4000 mg | SUBLINGUAL_TABLET | SUBLINGUAL | Status: DC | PRN

## 2015-09-03 MED ORDER — INSULIN GLARGINE 100 UNIT/ML SOLOSTAR PEN: 42.0000 [IU] | PEN_INJECTOR | Freq: Every day | SUBCUTANEOUS | Status: DC

## 2015-09-03 MED ORDER — METFORMIN HCL 1000 MG PO TABS: 1000.0000 mg | ORAL_TABLET | Freq: Two times a day (BID) | ORAL | Status: DC

## 2015-09-03 NOTE — Patient Instructions (Signed)
Diabetes: Let's maximize your metformin- take 1/2 tablet twice daily for 1 week and then increase to 1 tablet twice daily. Add 2 units of insulin every 7 days for fasting blood glucose > 130.   Your goal blood pressure is 140/90. Work on low salt/sodium diet - goal <1.5gm (1,500mg ) per day. Eat a diet high in fruits/vegetables and whole grains.  Look into mediterranean and DASH diet. Goal activity is 140min/wk of moderate intensity exercise.  This can be split into 30 minute chunks.  If you are not at this level, you can start with smaller 10-15 min increments and slowly build up activity. Look at [place].org for more resources  Please seek immediate medical attention at ER or Urgent Care if you develop: Chest pain, pressure or tightness. Shortness of breath accompanied by nausea or diaphoresis Visual changes Numbness or tingling on one side of the body Facial droop Altered mental status Or any concerning symptoms.

## 2015-09-03 NOTE — Assessment & Plan Note (Signed)
A1c elevated to 8.4. Plan to maximize metformin 1000mg  BID due to stable kidney function. Titrate lantus 2 units every 7 days for FBG >130.  UA micro ordered. Foot exam done: Will schedule eye exam. Recheck 3 mos.

## 2015-09-03 NOTE — Assessment & Plan Note (Signed)
Discussed diet and lifestyle changes to help assist with weight loss.

## 2015-09-03 NOTE — Assessment & Plan Note (Signed)
Follows with cardiology. Renewed PRN nitro. Continue Aspirin and statin for secondary prevention.

## 2015-09-03 NOTE — Progress Notes (Signed)
Subjective:    Patient ID: [name], female    DOB: [birth year], 70 y.o.   MRN: [id number]  HPI: ERNISHA CALABAZA is a 70 y.o. female presenting on 09/03/2015 for Diabetes   HPI  Diabetes follow-up. Recently lost her son. Sugars are running high. 138 avg sugar in the AM.  Doing 42 units insulin once daily. Wants to try and lose some weight. Doing fresh start meals.  Stopped soda. Exercise- no recent exercise. Only taking metformin once daily. No numbness and tingling in feet.  Just saw cardiology in May- took her off Effient. Taking ASA daily. No chest pain.  BP doing well at home. No CP, no HA, no visual changes.  Stress over recent death of son. Is going to a support group. Thinking of trying counseling.   Past Medical History  Diagnosis Date  . Coronary artery disease     a. 07/2013 STEMI/PCI ([place]): LM nl, LAD 100p (thrombectomy & 3.5x23 Alpine DES), LCX nl, RCA nl, EF 45%.  . Hypertensive heart disease   . Obstructive sleep apnea on CPAP   . Diabetes mellitus, type II ([place])   . Arthritis     knees   . Menopause   . Hypothyroid   . Hyperlipidemia   . Osteoarthritis   . Endometrial cancer ([place])   . Gravida 3 para 3   . Ischemic cardiomyopathy     a. 07/2013 EF 45% @ time of STEMI; b. 11/2013 Echo: EF 55-60%, mildly dil LA, nl RV fxn.    Current Outpatient Prescriptions on File Prior to Visit  Medication Sig  . acetaminophen (TYLENOL) 500 MG tablet Take 2 tablets by mouth 3 (three) times daily as needed.  Marland Kitchen aspirin 81 MG tablet Take 81 mg by mouth daily.  . B-D ULTRAFINE III SHORT PEN 31G X 8 MM MISC USE AS DIRECTED  . carvedilol (COREG) 3.125 MG tablet TAKE 1 TABLET BY MOUTH TWICE DAILY.  Marland Kitchen FLUoxetine (PROZAC) 10 MG capsule TAKE 1 TABLET BY MOUTH EVERY DAY. NEED TO MAKE APPOINTMENT WITH DOCTOR FOR MORE REFILLS   . glucose blood test strip 1 strip 3 (three) times daily.  Marland Kitchen levothyroxine (SYNTHROID, LEVOTHROID) 175 MCG tablet Take 1 tablet (175 mcg total) by mouth daily  before breakfast.  . losartan (COZAAR) 100 MG tablet TAKE 1 TABLET BY MOUTH DAILY.  . rosuvastatin (CRESTOR) 10 MG tablet Take 1 tablet (10 mg total) by mouth daily.  . sitaGLIPtin (JANUVIA) 100 MG tablet Take 1 tablet (100 mg total) by mouth daily.   No current facility-administered medications on file prior to visit.    Review of Systems  Constitutional: Negative for fever and chills.  HENT: Negative.   Respiratory: Negative for cough, chest tightness and wheezing.   Cardiovascular: Negative for chest pain and leg swelling.  Gastrointestinal: Negative for nausea, vomiting, abdominal pain, diarrhea and constipation.  Endocrine: Negative.  Negative for cold intolerance, heat intolerance, polydipsia, polyphagia and polyuria.  Genitourinary: Negative for dysuria and difficulty urinating.  Musculoskeletal: Negative.   Neurological: Negative for dizziness, light-headedness and numbness.  Psychiatric/Behavioral: Negative.    Per HPI unless specifically indicated above     Objective:    BP 138/84 mmHg  Pulse 51  Temp(Src) 98.1 F (36.7 C) (Oral)  Resp 16  Ht 5\' 7"  (1.702 m)  Wt 277 lb 3.2 oz (125.737 kg)  BMI 43.41 kg/m2  Wt Readings from Last 3 Encounters:  09/03/15 277 lb 3.2 oz (125.737 kg)  06/26/15 277  lb 1.9 oz (125.701 kg)  11/19/14 272 lb (123.378 kg)    Physical Exam  Constitutional: She is oriented to person, place, and time. She appears well-developed and well-nourished.  HENT:  Head: Normocephalic and atraumatic.  Neck: Neck supple.  Cardiovascular: Normal rate, regular rhythm and normal heart sounds.  Exam reveals no gallop and no friction rub.   No murmur heard. Pulmonary/Chest: Effort normal and breath sounds normal. She has no wheezes. She exhibits no tenderness.  Abdominal: Soft. Normal appearance and bowel sounds are normal. She exhibits no distension and no mass. There is no tenderness. There is no rebound and no guarding.  Musculoskeletal: Normal range of  motion. She exhibits no edema or tenderness.  Lymphadenopathy:    She has no cervical adenopathy.  Neurological: She is alert and oriented to person, place, and time.  Skin: Skin is warm and dry.   Diabetic Foot Exam - Simple   Simple Foot Form  Diabetic Foot exam was performed with the following findings:  Yes 09/03/2015  5:18 PM  Visual Inspection  No deformities, no ulcerations, no other skin breakdown bilaterally:  Yes  Sensation Testing  Intact to touch and monofilament testing bilaterally:  Yes  Pulse Check  Posterior Tibialis and Dorsalis pulse intact bilaterally:  Yes  Comments      Results for orders placed or performed in visit on 09/03/15  POCT HgB A1C  Result Value Ref Range   Hemoglobin A1C 8.4       Assessment & Plan:   Problem List Items Addressed This Visit      Cardiovascular and Mediastinum   Atherosclerosis of native coronary artery of native heart without angina pectoris    Follows with cardiology. Renewed PRN nitro. Continue Aspirin and statin for secondary prevention.       Relevant Medications   nitroGLYCERIN (NITROSTAT) 0.4 MG SL tablet   Other Relevant Orders   Lipid Profile   Essential (primary) hypertension   Relevant Medications   nitroGLYCERIN (NITROSTAT) 0.4 MG SL tablet     Endocrine   Diabetes mellitus type 2 with complications, uncontrolled ([place]) - Primary    A1c elevated to 8.4. Plan to maximize metformin 1000mg  BID due to stable kidney function. Titrate lantus 2 units every 7 days for FBG >130.  UA micro ordered. Foot exam done: Will schedule eye exam. Recheck 3 mos.       Relevant Medications   metFORMIN (GLUCOPHAGE) 1000 MG tablet   Insulin Glargine (LANTUS SOLOSTAR) 100 UNIT/ML Solostar Pen   Other Relevant Orders   POCT HgB A1C (Completed)   COMPLETE METABOLIC PANEL WITH GFR   POCT UA - Microalbumin   Hypothyroid    Check TSH for dose titration.       Relevant Orders   TSH     Other   Morbid obesity ([place])     Discussed diet and lifestyle changes to help assist with weight loss.       Relevant Medications   metFORMIN (GLUCOPHAGE) 1000 MG tablet   Insulin Glargine (LANTUS SOLOSTAR) 100 UNIT/ML Solostar Pen   Hyperlipidemia    Check lipid profile. Continue statin and ASA for secondary prevention.       Relevant Medications   nitroGLYCERIN (NITROSTAT) 0.4 MG SL tablet      Meds ordered this encounter  Medications  . nitroGLYCERIN (NITROSTAT) 0.4 MG SL tablet    Sig: Place 1 tablet (0.4 mg total) under the tongue every 5 (five) minutes as needed for chest pain.  Dispense:  30 tablet    Refill:  11    Order Specific Question:  Supervising Provider    Answer:  Arlis Porta [phone]  . metFORMIN (GLUCOPHAGE) 1000 MG tablet    Sig: Take 1 tablet (1,000 mg total) by mouth 2 (two) times daily with a meal.    Dispense:  180 tablet    Refill:  3    Order Specific Question:  Supervising Provider    Answer:  Arlis Porta [phone]  . Insulin Glargine (LANTUS SOLOSTAR) 100 UNIT/ML Solostar Pen    Sig: Inject 42 Units into the skin daily at 10 pm.    Dispense:  15 mL    Refill:  11    Order Specific Question:  Supervising Provider    Answer:  Arlis Porta [phone]      Follow up plan: Return in about 3 months (around 12/04/2015) for diabetes. Marland Kitchen

## 2015-09-03 NOTE — Assessment & Plan Note (Signed)
Check TSH for dose titration.

## 2015-09-03 NOTE — Assessment & Plan Note (Signed)
Check lipid profile. Continue statin and ASA for secondary prevention.

## 2015-09-04 ENCOUNTER — Other Ambulatory Visit: Payer: Self-pay | Admitting: Family Medicine

## 2015-09-04 DIAGNOSIS — E890 Postprocedural hypothyroidism: Secondary | ICD-10-CM

## 2015-09-04 LAB — POCT UA - MICROALBUMIN: Microalbumin Ur, POC: 20 mg/L

## 2015-09-04 MED ORDER — LEVOTHYROXINE SODIUM 150 MCG PO TABS: 150.0000 ug | ORAL_TABLET | Freq: Every day | ORAL | Status: DC

## 2015-09-08 ENCOUNTER — Encounter: Payer: Self-pay | Admitting: Family Medicine

## 2015-09-08 MED ORDER — SITAGLIPTIN PHOSPHATE 100 MG PO TABS: 100.0000 mg | ORAL_TABLET | Freq: Every day | ORAL | Status: DC

## 2015-09-16 ENCOUNTER — Other Ambulatory Visit: Payer: Self-pay | Admitting: Family Medicine

## 2015-10-30 ENCOUNTER — Other Ambulatory Visit: Payer: Self-pay | Admitting: Family Medicine

## 2015-11-27 DIAGNOSIS — H18831 Recurrent erosion of cornea, right eye: Secondary | ICD-10-CM | POA: Diagnosis not present

## 2015-12-05 DIAGNOSIS — H18831 Recurrent erosion of cornea, right eye: Secondary | ICD-10-CM | POA: Diagnosis not present

## 2015-12-11 ENCOUNTER — Ambulatory Visit (INDEPENDENT_AMBULATORY_CARE_PROVIDER_SITE_OTHER): Payer: Medicare Other | Admitting: Family Medicine

## 2015-12-11 ENCOUNTER — Encounter: Payer: Self-pay | Admitting: Family Medicine

## 2015-12-11 VITALS — BP 130/62 | HR 57 | Temp 98.2°F | Resp 16 | Ht 67.0 in | Wt 278.0 lb

## 2015-12-11 DIAGNOSIS — Z794 Long term (current) use of insulin: Secondary | ICD-10-CM | POA: Diagnosis not present

## 2015-12-11 DIAGNOSIS — E890 Postprocedural hypothyroidism: Secondary | ICD-10-CM | POA: Diagnosis not present

## 2015-12-11 DIAGNOSIS — M1712 Unilateral primary osteoarthritis, left knee: Secondary | ICD-10-CM | POA: Diagnosis not present

## 2015-12-11 DIAGNOSIS — E1159 Type 2 diabetes mellitus with other circulatory complications: Secondary | ICD-10-CM | POA: Diagnosis not present

## 2015-12-11 DIAGNOSIS — Z23 Encounter for immunization: Secondary | ICD-10-CM | POA: Diagnosis not present

## 2015-12-11 LAB — BASIC METABOLIC PANEL WITHOUT GFR
BUN: 21 mg/dL (ref 7–25)
CO2: 27 mmol/L (ref 20–31)
Calcium: 9.4 mg/dL (ref 8.6–10.4)
Chloride: 103 mmol/L (ref 98–110)
Creat: 1.03 mg/dL — ABNORMAL HIGH (ref 0.60–0.93)
GFR, Est African American: 64 mL/min
GFR, Est Non African American: 55 mL/min — ABNORMAL LOW
Glucose, Bld: 101 mg/dL — ABNORMAL HIGH (ref 65–99)
Potassium: 4.9 mmol/L (ref 3.5–5.3)
Sodium: 139 mmol/L (ref 135–146)

## 2015-12-11 LAB — LIPID PANEL
Cholesterol: 184 mg/dL (ref 125–200)
HDL: 63 mg/dL
LDL Cholesterol: 91 mg/dL
Total CHOL/HDL Ratio: 2.9 ratio
Triglycerides: 152 mg/dL — ABNORMAL HIGH
VLDL: 30 mg/dL

## 2015-12-11 LAB — POCT GLYCOSYLATED HEMOGLOBIN (HGB A1C): Hemoglobin A1C: 7.3

## 2015-12-11 LAB — TSH: TSH: 0.78 mIU/L

## 2015-12-11 MED ORDER — ZOSTER VACCINE LIVE 19400 UNT/0.65ML ~~LOC~~ SUSR: 0.6500 mL | Freq: Once | SUBCUTANEOUS | 0 refills | Status: AC

## 2015-12-11 MED ORDER — TRAMADOL HCL 50 MG PO TABS: 50.0000 mg | ORAL_TABLET | Freq: Three times a day (TID) | ORAL | 0 refills | Status: DC | PRN

## 2015-12-11 MED ORDER — ZOSTER VACCINE LIVE 19400 UNT/0.65ML ~~LOC~~ SUSR: 0.6500 mL | Freq: Once | SUBCUTANEOUS | 0 refills | Status: DC

## 2015-12-11 NOTE — Assessment & Plan Note (Signed)
Renew PRN tramadol for pain. Refer to orthopedics to discuss joint replacement or joint injections.

## 2015-12-11 NOTE — Assessment & Plan Note (Addendum)
Great reduction in A1c to 7.3%- no changes to regimen today. Continue to monitor blood sugars closely. Titrate if fasting avg above 130. Encouraged continued diet and lifestyle changes.  Eye exam UTD Urine microalbumin UTD Foot exam UTD Recheck 3 mos.

## 2015-12-11 NOTE — Progress Notes (Signed)
Subjective:    Patient ID: [name], female    DOB: November 07, 1945, 70 y.o.   MRN: [id number]  HPI: [name] is a 70 y.o. female presenting on 12/11/2015 for Diabetes (highest 154 and lowest 58)   HPI  Pt presents diabetes follow-up. A1c 7.3% down from 8.4%- taking 2 metformin per day 80% of the time. Staying at 46 units of insulin per day. Having some low sugars with titration. Highest sugar is 154- low 80. Avg under 130.  Is only taking one carvedilol once daily.  Diabetic eye exam scheduled in November- had one 6 mos ago. Has chronic corneal abrasions.  Would like a referral to ortho to discuss osteoarthritis of her knee and possible knee replacement.  Pt desires flu shot and prescription for varicella vaccination today.   Past Medical History:  Diagnosis Date  . Arthritis    knees   . Coronary artery disease    a. 07/2013 STEMI/PCI ([place]): LM nl, LAD 100p (thrombectomy & 3.5x23 Alpine DES), LCX nl, RCA nl, EF 45%.  . Diabetes mellitus, type II ([place])   . Endometrial cancer ([place])   . Gravida 3 para 3   . Hyperlipidemia   . Hypertensive heart disease   . Hypothyroid   . Ischemic cardiomyopathy    a. 07/2013 EF 45% @ time of STEMI; b. 11/2013 Echo: EF 55-60%, mildly dil LA, nl RV fxn.  . Menopause   . Obstructive sleep apnea on CPAP   . Osteoarthritis     Current Outpatient Prescriptions on File Prior to Visit  Medication Sig  . acetaminophen (TYLENOL) 500 MG tablet Take 2 tablets by mouth 3 (three) times daily as needed.  Marland Kitchen aspirin 81 MG tablet Take 81 mg by mouth daily.  . B-D ULTRAFINE III SHORT PEN 31G X 8 MM MISC USE AS DIRECTED  . carvedilol (COREG) 3.125 MG tablet TAKE 1 TABLET BY MOUTH TWICE DAILY.  Marland Kitchen FLUoxetine (PROZAC) 10 MG capsule TAKE 1 TABLET BY MOUTH EVERY DAY. NEED TO MAKE APPOINTMENT WITH DOCTOR FOR MORE REFILLS   . glucose blood test strip 1 strip 3 (three) times daily.  . Insulin Glargine (LANTUS SOLOSTAR) 100 UNIT/ML Solostar Pen Inject 42  Units into the skin daily at 10 pm.  . levothyroxine (SYNTHROID, LEVOTHROID) 150 MCG tablet Take 1 tablet (150 mcg total) by mouth daily.  Marland Kitchen losartan (COZAAR) 100 MG tablet TAKE 1 TABLET BY MOUTH DAILY.  . metFORMIN (GLUCOPHAGE) 1000 MG tablet Take 1 tablet (1,000 mg total) by mouth 2 (two) times daily with a meal.  . nitroGLYCERIN (NITROSTAT) 0.4 MG SL tablet Place 1 tablet (0.4 mg total) under the tongue every 5 (five) minutes as needed for chest pain.  . rosuvastatin (CRESTOR) 10 MG tablet TAKE 1 TABLET BY MOUTH EVERY DAY  . sitaGLIPtin (JANUVIA) 100 MG tablet Take 1 tablet (100 mg total) by mouth daily.   No current facility-administered medications on file prior to visit.     Review of Systems  Constitutional: Negative for chills and fever.  HENT: Negative.   Respiratory: Negative for cough, chest tightness and wheezing.   Cardiovascular: Negative for chest pain and leg swelling.  Gastrointestinal: Negative for abdominal pain, constipation, diarrhea, nausea and vomiting.  Endocrine: Negative.  Negative for cold intolerance, heat intolerance, polydipsia, polyphagia and polyuria.  Genitourinary: Negative for difficulty urinating and dysuria.  Musculoskeletal: Positive for arthralgias and joint swelling.  Neurological: Negative for dizziness, light-headedness and numbness.  Psychiatric/Behavioral: Negative.  Per HPI unless specifically indicated above     Objective:    BP 130/62   Pulse (!) 57   Temp 98.2 F (36.8 C) (Oral)   Resp 16   Ht 5\' 7"  (1.702 m)   Wt 278 lb (126.1 kg)   BMI 43.54 kg/m   Wt Readings from Last 3 Encounters:  12/11/15 278 lb (126.1 kg)  09/03/15 277 lb 3.2 oz (125.7 kg)  06/26/15 277 lb 1.9 oz (125.7 kg)    Physical Exam  Constitutional: She is oriented to person, place, and time. She appears well-developed and well-nourished.  HENT:  Head: Normocephalic and atraumatic.  Neck: Neck supple.  Cardiovascular: Normal rate, regular rhythm and  normal heart sounds.  Exam reveals no gallop and no friction rub.   No murmur heard. Pulmonary/Chest: Effort normal and breath sounds normal. She has no wheezes. She exhibits no tenderness.  Abdominal: Soft. Normal appearance and bowel sounds are normal. She exhibits no distension and no mass. There is no tenderness. There is no rebound and no guarding.  Musculoskeletal: She exhibits no edema.       Right knee: Normal.       Left knee: She exhibits decreased range of motion. She exhibits normal alignment, no LCL laxity, normal meniscus and no MCL laxity. Tenderness found. Medial joint line tenderness noted. No patellar tendon tenderness noted.  Lymphadenopathy:    She has no cervical adenopathy.  Neurological: She is alert and oriented to person, place, and time.  Skin: Skin is warm and dry.   Results for orders placed or performed in visit on 12/11/15  POCT HgB A1C  Result Value Ref Range   Hemoglobin A1C 7.3       Assessment & Plan:   Problem List Items Addressed This Visit      Endocrine   Diabetes mellitus, type II ([place]) - Primary    Great reduction in A1c to 7.3%- no changes to regimen today. Continue to monitor blood sugars closely. Titrate if fasting avg above 130. Encouraged continued diet and lifestyle changes.  Eye exam UTD Urine microalbumin UTD Foot exam UTD Recheck 3 mos.       Relevant Orders   POCT HgB A1C (Completed)   BASIC METABOLIC PANEL WITH GFR   Lipid Profile   Hypothyroid    Last TSH <.45- recheck today after dose titration.       Relevant Orders   TSH     Musculoskeletal and Integument   Arthritis of knee, degenerative    Renew PRN tramadol for pain. Refer to orthopedics to discuss joint replacement or joint injections.       Relevant Medications   traMADol (ULTRAM) 50 MG tablet    Other Visit Diagnoses    Need for varicella vaccine       Relevant Medications   Zoster Vaccine Live, PF, (ZOSTAVAX) 29562 UNT/0.65ML injection   Other Relevant  Orders   Varicella-zoster vaccine subcutaneous   Need for influenza vaccination       Relevant Orders   Flu vaccine HIGH DOSE PF (Fluzone High dose) (Completed)      Meds ordered this encounter  Medications  . traMADol (ULTRAM) 50 MG tablet    Sig: Take 1 tablet (50 mg total) by mouth every 8 (eight) hours as needed.    Dispense:  60 tablet    Refill:  0    Order Specific Question:   Supervising Provider    Answer:   Arlis Porta [phone]  .  DISCONTD: Zoster Vaccine Live, PF, (ZOSTAVAX) 28413 UNT/0.65ML injection    Sig: Inject 19,400 Units into the skin once.    Dispense:  1 each    Refill:  0    Order Specific Question:   Supervising Provider    Answer:   Arlis Porta [phone]  . Zoster Vaccine Live, PF, (ZOSTAVAX) 24401 UNT/0.65ML injection    Sig: Inject 19,400 Units into the skin once.    Dispense:  1 each    Refill:  0    Order Specific Question:   Supervising Provider    Answer:   Arlis Porta [phone]      Follow up plan: Return in about 3 months (around 03/12/2016), or if symptoms worsen or fail to improve, for Diabetes. Marland Kitchen

## 2015-12-11 NOTE — Patient Instructions (Signed)
Great job on your diabetes! No changes to medications today. Please continue to take your Lantus as directed.  Your goal blood pressure is 140/90 Work on low salt/sodium diet - goal <1.5gm (1,500mg ) per day. Eat a diet high in fruits/vegetables and whole grains.  Look into mediterranean and DASH diet. Goal activity is 133min/wk of moderate intensity exercise.  This can be split into 30 minute chunks.  If you are not at this level, you can start with smaller 10-15 min increments and slowly build up activity. Look at [place].org for more resources  I have placed a referral to [facility] for you to discuss your knee.

## 2015-12-11 NOTE — Assessment & Plan Note (Signed)
Last TSH <.45- recheck today after dose titration.

## 2015-12-16 ENCOUNTER — Encounter: Payer: Self-pay | Admitting: Family Medicine

## 2015-12-17 ENCOUNTER — Other Ambulatory Visit: Payer: Self-pay | Admitting: Family Medicine

## 2015-12-17 DIAGNOSIS — Z794 Long term (current) use of insulin: Secondary | ICD-10-CM

## 2015-12-17 DIAGNOSIS — IMO0002 Reserved for concepts with insufficient information to code with codable children: Secondary | ICD-10-CM

## 2015-12-17 DIAGNOSIS — E890 Postprocedural hypothyroidism: Secondary | ICD-10-CM

## 2015-12-17 DIAGNOSIS — E118 Type 2 diabetes mellitus with unspecified complications: Secondary | ICD-10-CM

## 2015-12-17 DIAGNOSIS — E1165 Type 2 diabetes mellitus with hyperglycemia: Secondary | ICD-10-CM

## 2015-12-17 MED ORDER — METFORMIN HCL 1000 MG PO TABS: 1000.0000 mg | ORAL_TABLET | Freq: Two times a day (BID) | ORAL | 3 refills | Status: DC

## 2015-12-17 MED ORDER — SITAGLIPTIN PHOSPHATE 100 MG PO TABS: 100.0000 mg | ORAL_TABLET | Freq: Every day | ORAL | 3 refills | Status: DC

## 2015-12-17 MED ORDER — CARVEDILOL 3.125 MG PO TABS: 3.1250 mg | ORAL_TABLET | Freq: Two times a day (BID) | ORAL | 3 refills | Status: DC

## 2015-12-17 MED ORDER — INSULIN GLARGINE 100 UNIT/ML SOLOSTAR PEN: 42.0000 [IU] | PEN_INJECTOR | Freq: Every day | SUBCUTANEOUS | 11 refills | Status: DC

## 2015-12-17 MED ORDER — ROSUVASTATIN CALCIUM 10 MG PO TABS: 10.0000 mg | ORAL_TABLET | Freq: Every day | ORAL | 3 refills | Status: DC

## 2015-12-17 MED ORDER — INSULIN PEN NEEDLE 31G X 8 MM MISC: 3 refills | Status: DC

## 2015-12-17 MED ORDER — FLUOXETINE HCL 10 MG PO CAPS: ORAL_CAPSULE | ORAL | 3 refills | Status: DC

## 2015-12-17 MED ORDER — LEVOTHYROXINE SODIUM 150 MCG PO TABS: 150.0000 ug | ORAL_TABLET | Freq: Every day | ORAL | 3 refills | Status: DC

## 2015-12-17 MED ORDER — LOSARTAN POTASSIUM 100 MG PO TABS: 100.0000 mg | ORAL_TABLET | Freq: Every day | ORAL | 3 refills | Status: DC

## 2016-01-08 DIAGNOSIS — H40003 Preglaucoma, unspecified, bilateral: Secondary | ICD-10-CM | POA: Diagnosis not present

## 2016-01-08 LAB — HM DIABETES EYE EXAM

## 2016-01-14 ENCOUNTER — Encounter: Payer: Self-pay | Admitting: Family Medicine

## 2016-01-15 ENCOUNTER — Other Ambulatory Visit: Payer: Self-pay | Admitting: Family Medicine

## 2016-01-15 DIAGNOSIS — Z0184 Encounter for antibody response examination: Secondary | ICD-10-CM

## 2016-01-16 ENCOUNTER — Other Ambulatory Visit: Payer: Self-pay

## 2016-01-16 ENCOUNTER — Encounter: Payer: Self-pay | Admitting: *Deleted

## 2016-01-16 NOTE — Progress Notes (Signed)
Lab orders placed.  

## 2016-01-19 ENCOUNTER — Other Ambulatory Visit: Payer: Self-pay

## 2016-01-19 ENCOUNTER — Other Ambulatory Visit: Payer: Self-pay | Admitting: Family Medicine

## 2016-01-19 ENCOUNTER — Ambulatory Visit (INDEPENDENT_AMBULATORY_CARE_PROVIDER_SITE_OTHER): Payer: Self-pay | Admitting: *Deleted

## 2016-01-19 DIAGNOSIS — Z0184 Encounter for antibody response examination: Secondary | ICD-10-CM

## 2016-01-19 DIAGNOSIS — Z23 Encounter for immunization: Secondary | ICD-10-CM

## 2016-01-19 NOTE — Addendum Note (Signed)
Addended by: Devona Konig on: 01/19/2016 10:22 AM   Modules accepted: Orders

## 2016-01-19 NOTE — Progress Notes (Unsigned)
varice

## 2016-01-20 LAB — VARICELLA ZOSTER ANTIBODY, IGG: Varicella IgG: >4000 Index — ABNORMAL HIGH (ref <135.00)

## 2016-01-20 LAB — MEASLES/MUMPS/RUBELLA IMMUNITY
Mumps IgG: 249 AU/mL — ABNORMAL HIGH (ref <9.00)
RUBELLA: 22.8 {index} — AB (ref <0.90)
Rubeola IgG: >300 AU/mL — ABNORMAL HIGH (ref <25.00)

## 2016-01-21 ENCOUNTER — Other Ambulatory Visit: Payer: Self-pay | Admitting: Family Medicine

## 2016-01-21 ENCOUNTER — Encounter: Payer: Self-pay | Admitting: Family Medicine

## 2016-01-21 ENCOUNTER — Other Ambulatory Visit: Payer: Self-pay | Admitting: *Deleted

## 2016-01-21 DIAGNOSIS — Z021 Encounter for pre-employment examination: Secondary | ICD-10-CM

## 2016-01-21 NOTE — Progress Notes (Signed)
Ordered UDS 10 with confirmation for pre-employement drug screen testing requested by patient, she will be notified to collect sample, results will be forwarded to PCP Dr Luan Pulling.  Nobie Putnam, DO [facility] 01/21/2016, 11:07 AM

## 2016-01-30 DIAGNOSIS — Z021 Encounter for pre-employment examination: Secondary | ICD-10-CM | POA: Diagnosis not present

## 2016-02-01 LAB — DRUG ABUSE PANEL 10-50, U
AMPHETAMINES (1000 ng/mL SCRN): NEGATIVE
BARBITURATES: NEGATIVE
BENZODIAZEPINES: NEGATIVE
COCAINE METABOLITES: NEGATIVE
MARIJUANA MET (50 NG/ML SCRN): NEGATIVE
METHADONE: NEGATIVE
METHAQUALONE: NEGATIVE
OPIATES: NEGATIVE
PHENCYCLIDINE: NEGATIVE
PROPOXYPHENE: NEGATIVE

## 2016-03-25 ENCOUNTER — Encounter: Payer: Self-pay | Admitting: Family Medicine

## 2016-03-25 ENCOUNTER — Ambulatory Visit (INDEPENDENT_AMBULATORY_CARE_PROVIDER_SITE_OTHER): Payer: Medicare Other | Admitting: Family Medicine

## 2016-03-25 VITALS — BP 160/70 | HR 52 | Temp 98.1°F | Resp 16 | Ht 67.0 in | Wt 276.0 lb

## 2016-03-25 DIAGNOSIS — G4733 Obstructive sleep apnea (adult) (pediatric): Secondary | ICD-10-CM

## 2016-03-25 DIAGNOSIS — I10 Essential (primary) hypertension: Secondary | ICD-10-CM

## 2016-03-25 DIAGNOSIS — I2510 Atherosclerotic heart disease of native coronary artery without angina pectoris: Secondary | ICD-10-CM

## 2016-03-25 DIAGNOSIS — E1159 Type 2 diabetes mellitus with other circulatory complications: Secondary | ICD-10-CM | POA: Diagnosis not present

## 2016-03-25 DIAGNOSIS — E890 Postprocedural hypothyroidism: Secondary | ICD-10-CM

## 2016-03-25 DIAGNOSIS — Z794 Long term (current) use of insulin: Secondary | ICD-10-CM

## 2016-03-25 DIAGNOSIS — E7849 Other hyperlipidemia: Secondary | ICD-10-CM

## 2016-03-25 DIAGNOSIS — E119 Type 2 diabetes mellitus without complications: Secondary | ICD-10-CM | POA: Diagnosis not present

## 2016-03-25 DIAGNOSIS — Z9989 Dependence on other enabling machines and devices: Secondary | ICD-10-CM

## 2016-03-25 DIAGNOSIS — E784 Other hyperlipidemia: Secondary | ICD-10-CM | POA: Diagnosis not present

## 2016-03-25 DIAGNOSIS — F330 Major depressive disorder, recurrent, mild: Secondary | ICD-10-CM

## 2016-03-25 LAB — TB SKIN TEST
INDURATION: 0 mm
TB SKIN TEST: NEGATIVE

## 2016-03-25 LAB — POCT GLYCOSYLATED HEMOGLOBIN (HGB A1C): Hemoglobin A1C: 6.9

## 2016-03-25 MED ORDER — AMLODIPINE BESYLATE 2.5 MG PO TABS: 2.5000 mg | ORAL_TABLET | Freq: Every day | ORAL | 6 refills | Status: DC

## 2016-03-25 NOTE — Progress Notes (Signed)
Name: [name]   MRN: [id number]    DOB: 10/01/1945   Date:03/25/2016       Progress Note  Subjective  Chief Complaint  Chief Complaint  Patient presents with  . Diabetes  . Hypertension    HPI Here for f/u of HBP and DM.  She is trying to eat better and has lost some weight.  She is taking all of her meds and feeling pretty well except for leg (knee) pain with standing.  No problem-specific Assessment & Plan notes found for this encounter.   Past Medical History:  Diagnosis Date  . Arthritis    knees   . Coronary artery disease    a. 07/2013 STEMI/PCI ([place]): LM nl, LAD 100p (thrombectomy & 3.5x23 Alpine DES), LCX nl, RCA nl, EF 45%.  . Diabetes mellitus, type II ([place])   . Endometrial cancer ([place])   . Gravida 3 para 3   . Hyperlipidemia   . Hypertensive heart disease   . Hypothyroid   . Ischemic cardiomyopathy    a. 07/2013 EF 45% @ time of STEMI; b. 11/2013 Echo: EF 55-60%, mildly dil LA, nl RV fxn.  . Menopause   . Obstructive sleep apnea on CPAP   . Osteoarthritis     Past Surgical History:  Procedure Laterality Date  . ABDOMINAL HYSTERECTOMY    . CARDIAC CATHETERIZATION    . CORONARY ANGIOPLASTY     drug eluting stent placed at [place]    . LAPAROSCOPIC HYSTERECTOMY    . THYROIDECTOMY      Family History  Problem Relation Age of Onset  . Heart attack Father   . Heart failure Father   . Hypertension Father   . Hyperlipidemia Father   . Heart disease Father 43    CABG     Social History   Social History  . Marital status: Married    Spouse name: N/A  . Number of children: N/A  . Years of education: N/A   Occupational History  . Not on file.   Social History Main Topics  . Smoking status: Never Smoker  . Smokeless tobacco: Never Used  . Alcohol use No  . Drug use: No  . Sexual activity: Not on file   Other Topics Concern  . Not on file   Social History Narrative  . No narrative on file      Current Outpatient Prescriptions:  .  acetaminophen (TYLENOL) 500 MG tablet, Take 2 tablets by mouth 3 (three) times daily as needed., Disp: , Rfl:  .  aspirin 81 MG tablet, Take 81 mg by mouth daily., Disp: , Rfl:  .  carvedilol (COREG) 3.125 MG tablet, Take 1 tablet (3.125 mg total) by mouth 2 (two) times daily., Disp: 180 tablet, Rfl: 3 .  FLUoxetine (PROZAC) 10 MG capsule, TAKE 1 TABLET BY MOUTH EVERY DAY. NEED TO MAKE APPOINTMENT WITH DOCTOR FOR MORE REFILLS, Disp: 90 capsule, Rfl: 3 .  glucose blood test strip, 1 strip 3 (three) times daily., Disp: , Rfl:  .  Insulin Glargine (LANTUS SOLOSTAR) 100 UNIT/ML Solostar Pen, Inject 42 Units into the skin daily at 10 pm. (Patient taking differently: Inject 46 Units into the skin daily at 10 pm. ), Disp: 15 mL, Rfl: 11 .  Insulin Pen Needle (B-D ULTRAFINE III SHORT PEN) 31G X 8 MM MISC, USE AS DIRECTED, Disp: 300 each, Rfl: 3 .  levothyroxine (SYNTHROID, LEVOTHROID) 150 MCG tablet, Take 1 tablet (  150 mcg total) by mouth daily., Disp: 90 tablet, Rfl: 3 .  losartan (COZAAR) 100 MG tablet, Take 1 tablet (100 mg total) by mouth daily., Disp: 90 tablet, Rfl: 3 .  metFORMIN (GLUCOPHAGE) 1000 MG tablet, Take 1 tablet (1,000 mg total) by mouth 2 (two) times daily with a meal., Disp: 180 tablet, Rfl: 3 .  nitroGLYCERIN (NITROSTAT) 0.4 MG SL tablet, Place 1 tablet (0.4 mg total) under the tongue every 5 (five) minutes as needed for chest pain., Disp: 30 tablet, Rfl: 11 .  rosuvastatin (CRESTOR) 10 MG tablet, Take 1 tablet (10 mg total) by mouth daily., Disp: 90 tablet, Rfl: 3 .  sitaGLIPtin (JANUVIA) 100 MG tablet, Take 1 tablet (100 mg total) by mouth daily., Disp: 90 tablet, Rfl: 3 .  traMADol (ULTRAM) 50 MG tablet, Take 1 tablet (50 mg total) by mouth every 8 (eight) hours as needed., Disp: 60 tablet, Rfl: 0 .  amLODipine (NORVASC) 2.5 MG tablet, Take 1 tablet (2.5 mg total) by mouth daily., Disp: 30 tablet, Rfl: 6  Not on File   Review of Systems   Constitutional: Negative for chills, fever, malaise/fatigue and weight loss.  HENT: Negative for hearing loss and tinnitus.   Eyes: Negative for blurred vision and double vision.  Respiratory: Negative for cough, shortness of breath and wheezing.   Cardiovascular: Negative for chest pain, palpitations and leg swelling.  Gastrointestinal: Negative for abdominal pain, blood in stool and heartburn.  Genitourinary: Negative for dysuria, frequency and urgency.  Musculoskeletal: Positive for joint pain.  Skin: Negative for rash.  Neurological: Negative for dizziness, tingling, tremors, weakness and headaches.      Objective  Vitals:   03/25/16 1058 03/25/16 1203  BP: (!) 155/56 (!) 160/70  Pulse: (!) 52   Resp: 16   Temp: 98.1 F (36.7 C)   TempSrc: Oral   Weight: 276 lb (125.2 kg)   Height: _0  (1.702 m)     Physical Exam  Constitutional: She is oriented to person, place, and time and well-developed, well-nourished, and in no distress. No distress.  HENT:  Head: Normocephalic and atraumatic.  Eyes: Conjunctivae are normal. Pupils are equal, round, and reactive to light. No scleral icterus.  Neck: Normal range of motion. Neck supple. No thyromegaly present.  Cardiovascular: Normal rate, regular rhythm and normal heart sounds.  Exam reveals no gallop and no friction rub.   No murmur heard. Pulmonary/Chest: Effort normal and breath sounds normal. No respiratory distress. She has no wheezes. She has no rales.  Musculoskeletal: She exhibits no edema.  Lymphadenopathy:    She has no cervical adenopathy.  Neurological: She is alert and oriented to person, place, and time.  Vitals reviewed.      Recent Results (from the past 2160 hour(s))  HM DIABETES EYE EXAM     Status: None   Collection Time: 01/08/16 10:25 AM  Result Value Ref Range   HM Diabetic Eye Exam No Retinopathy No Retinopathy  Varicella zoster antibody, IgG     Status: Abnormal   Collection Time: 01/19/16  12:01 AM  Result Value Ref Range   Varicella IgG >4000.00 (H) <135.00 Index    Comment:        Index           Interpretation      =====           ==============      < 135.00          Negative  135.00-164.99     Equivocal      >= 165.00         Positive   A positive result indicates that the patient has antibody to VZV but does not differentiate between an active or past infection. The clinical diagnosis must be interpreted in conjunction with the clinical signs and symptoms of the patient. This assay reliably measures immunity due to previous infection but may not be sensitive enough to detect antibodies induced by vaccination. Thus, a negative result in a vaccinated individual does not necessarily indicate susceptibility to VZV infection.     Measles/Mumps/Rubella Immunity     Status: Abnormal   Collection Time: 01/19/16 10:02 AM  Result Value Ref Range   Rubella 22.80 (H) <0.90 Index    Comment:                  Index          Interpretation                =====          ==============                <0.90          Not consistent with immunity                0.90-0.99      Equivocal                >=1.00         Consistent with immunity   The presence of Rubella IgG antibody suggests immunization or past or current infection with Rubella virus.    Mumps IgG 249.00 (H) <9.00 AU/mL    Comment:        AU/mL                Interpretation      =====                ==============      < 9.00               Negative      9.00 - 10.99         Equivocal      > 10.99              Positive   A positive result indicates that the patient has antibody to mumps virus. It does not differentiate between an active or past infection. The clinical diagnosis must be interpreted in conjunction with clinical signs and symptoms of the patient.    Rubeola IgG >300.00 (H) <25.00 AU/mL    Comment:        AU/mL                Interpretation      =====                ==============       < 25.00              Negative      25.00 - 29.99        Equivocal      > 29.99              Positive   A positive result indicates that the patient has antibody to measles virus. It does not differentiate between an active or past infection. The clinical diagnosis must be interpreted in conjunction with clinical signs and symptoms of the  patient.   Drug Abuse Panel 10-50, U     Status: None   Collection Time: 01/30/16 12:00 AM  Result Value Ref Range   PLEASE NOTE: See Below     Comment:      * These results are for medical treatment only.  *    * Analysis was performed as non-forensic testing. *      AMPHETAMINES (1000 ng/mL SCRN) NEGATIVE    BARBITURATES NEGATIVE    BENZODIAZEPINES NEGATIVE    COCAINE METABOLITES NEGATIVE    MARIJUANA MET (50 ng/mL SCRN) NEGATIVE    METHADONE NEGATIVE    METHAQUALONE NEGATIVE    OPIATES NEGATIVE    PHENCYCLIDINE NEGATIVE    PROPOXYPHENE NEGATIVE    See Note:       Comment:                                                    THE SUBMITTED URINE SPECIMEN WAS TESTED AT THE LISTED CUTOFFS AND  CONFIRMED BY A SECOND INDEPENDENT CHEMICAL METHOD.     DRUG CLASS            INITIAL TEST                             LEVEL    AMPHETAMINES             1000 ng/mL   BARBITURATES              300 ng/mL  BENZODIAZEPINES           300 ng/mL  COCAINE METABOLITES       300 ng/mL  MARIJUANA METABOLITES      50 ng/mL  METHADONE                 300 ng/mL  METHAQUALONE              300 ng/mL  OPIATES                   300 ng/mL  PHENCYCLIDINE              25 ng/mL  PROPOXYPHENE              300 ng/mL     This drug testing is for medical treatment only. Analysis was performed as non-forensic testing and these results should be used only by healthcare providers to render diagnosis or treatment, or to monitor progress of medical conditions.   For assistance with interpreting these drug results, please contact a [facility] Toxicology Specialist:  [phone]), M-F, 8am-6pm EST.     TB Skin Test     Status: Normal   Collection Time: 03/25/16 11:15 AM  Result Value Ref Range   TB Skin Test Negative    Induration 0 mm  POCT HgB A1C     Status: Abnormal   Collection Time: 03/25/16 11:30 AM  Result Value Ref Range   Hemoglobin A1C 6.9%      Assessment & Plan  Problem List Items Addressed This Visit      Cardiovascular and Mediastinum   Atherosclerosis of native coronary artery of native heart without angina pectoris   Relevant Medications   amLODipine (NORVASC) 2.5 MG tablet   Essential (primary) hypertension   Relevant  Medications   amLODipine (NORVASC) 2.5 MG tablet     Respiratory   Obstructive sleep apnea on CPAP     Endocrine   Diabetes mellitus, type II (HCC)   Hypothyroid     Other   Morbid obesity (HCC)   Hyperlipidemia   Relevant Medications   amLODipine (NORVASC) 2.5 MG tablet   Clinical depression    Other Visit Diagnoses    Diabetes mellitus without complication ([place])    -  Primary   Relevant Orders   POCT HgB A1C (Completed)      Meds ordered this encounter  Medications  . amLODipine (NORVASC) 2.5 MG tablet    Sig: Take 1 tablet (2.5 mg total) by mouth daily.    Dispense:  30 tablet    Refill:  6   1. Diabetes mellitus without complication ([place])  - POCT HgB A1C-6.9 2. Essential (primary) hypertension Cont other meds - amLODipine (NORVASC) 2.5 MG tablet; Take 1 tablet (2.5 mg total) by mouth daily.  Dispense: 30 tablet; Refill: 6  3. Atherosclerosis of native coronary artery of native heart without angina pectoris   4. Obstructive sleep apnea on CPAP   5. Type 2 diabetes mellitus with other circulatory complication, with long-term current use of insulin ([place])   6. Postoperative hypothyroidism Cont med  7. Mild episode of recurrent major depressive disorder ([place]) Cont med  8. Other hyperlipidemia Cont med  9. Morbid obesity ([place]) Cont weight  loss.

## 2016-05-06 ENCOUNTER — Encounter: Payer: Self-pay | Admitting: Family Medicine

## 2016-05-06 ENCOUNTER — Ambulatory Visit (INDEPENDENT_AMBULATORY_CARE_PROVIDER_SITE_OTHER): Payer: Self-pay | Admitting: Family Medicine

## 2016-05-06 VITALS — BP 135/65 | HR 60 | Temp 97.9°F | Resp 16 | Ht 67.0 in | Wt 269.0 lb

## 2016-05-06 DIAGNOSIS — Z794 Long term (current) use of insulin: Secondary | ICD-10-CM

## 2016-05-06 DIAGNOSIS — G4733 Obstructive sleep apnea (adult) (pediatric): Secondary | ICD-10-CM

## 2016-05-06 DIAGNOSIS — Z9989 Dependence on other enabling machines and devices: Secondary | ICD-10-CM

## 2016-05-06 DIAGNOSIS — E890 Postprocedural hypothyroidism: Secondary | ICD-10-CM

## 2016-05-06 DIAGNOSIS — E118 Type 2 diabetes mellitus with unspecified complications: Secondary | ICD-10-CM

## 2016-05-06 DIAGNOSIS — N1831 Chronic kidney disease, stage 3a: Secondary | ICD-10-CM

## 2016-05-06 DIAGNOSIS — E1165 Type 2 diabetes mellitus with hyperglycemia: Secondary | ICD-10-CM

## 2016-05-06 DIAGNOSIS — IMO0002 Reserved for concepts with insufficient information to code with codable children: Secondary | ICD-10-CM

## 2016-05-06 DIAGNOSIS — N183 Chronic kidney disease, stage 3 (moderate): Secondary | ICD-10-CM

## 2016-05-06 DIAGNOSIS — I2510 Atherosclerotic heart disease of native coronary artery without angina pectoris: Secondary | ICD-10-CM

## 2016-05-06 DIAGNOSIS — I10 Essential (primary) hypertension: Secondary | ICD-10-CM

## 2016-05-06 MED ORDER — AMLODIPINE BESYLATE 2.5 MG PO TABS: 2.5000 mg | ORAL_TABLET | Freq: Every day | ORAL | 3 refills | Status: DC

## 2016-05-06 NOTE — Progress Notes (Signed)
Name: [name]   MRN: [id number]    DOB: Jun 20, 1945   Date:05/06/2016       Progress Note  Subjective  Chief Complaint  Chief Complaint  Patient presents with  . Diabetes  . Hypertension    HPI Here for f/u of DM and HBP.  BSs at home from 100 to 130.  She has not checked BPs at home. She is taking all meds.  Feeling well overall.  She has had some persistent nasal congestion, but is not ill.  No problem-specific Assessment & Plan notes found for this encounter.   Past Medical History:  Diagnosis Date  . Arthritis    knees   . Coronary artery disease    a. 07/2013 STEMI/PCI ([place] LM nl, LAD 100p (thrombectomy & 3.5x23 Alpine DES), LCX nl, RCA nl, EF 45%.  . Diabetes mellitus, type II ([place])   . Endometrial cancer ([place])   . Gravida 3 para 3   . Hyperlipidemia   . Hypertensive heart disease   . Hypothyroid   . Ischemic cardiomyopathy    a. 07/2013 EF 45% @ time of STEMI; b. 11/2013 Echo: EF 55-60%, mildly dil LA, nl RV fxn.  . Menopause   . Obstructive sleep apnea on CPAP   . Osteoarthritis     Past Surgical History:  Procedure Laterality Date  . ABDOMINAL HYSTERECTOMY    . CARDIAC CATHETERIZATION    . CORONARY ANGIOPLASTY     drug eluting stent placed at [place]    . LAPAROSCOPIC HYSTERECTOMY    . THYROIDECTOMY      Family History  Problem Relation Age of Onset  . Heart attack Father   . Heart failure Father   . Hypertension Father   . Hyperlipidemia Father   . Heart disease Father 75    CABG     Social History   Social History  . Marital status: Married    Spouse name: N/A  . Number of children: N/A  . Years of education: N/A   Occupational History  . Not on file.   Social History Main Topics  . Smoking status: Never Smoker  . Smokeless tobacco: Never Used  . Alcohol use No  . Drug use: No  . Sexual activity: Not on file   Other Topics Concern  . Not on file   Social History Narrative  . No  narrative on file     Current Outpatient Prescriptions:  .  acetaminophen (TYLENOL) 500 MG tablet, Take 2 tablets by mouth 3 (three) times daily as needed., Disp: , Rfl:  .  amLODipine (NORVASC) 2.5 MG tablet, Take 1 tablet (2.5 mg total) by mouth daily., Disp: 90 tablet, Rfl: 3 .  aspirin 81 MG tablet, Take 81 mg by mouth daily., Disp: , Rfl:  .  carvedilol (COREG) 3.125 MG tablet, Take 1 tablet (3.125 mg total) by mouth 2 (two) times daily., Disp: 180 tablet, Rfl: 3 .  FLUoxetine (PROZAC) 10 MG capsule, TAKE 1 TABLET BY MOUTH EVERY DAY. NEED TO MAKE APPOINTMENT WITH DOCTOR FOR MORE REFILLS, Disp: 90 capsule, Rfl: 3 .  glucose blood test strip, 1 strip 3 (three) times daily., Disp: , Rfl:  .  Insulin Glargine (LANTUS SOLOSTAR) 100 UNIT/ML Solostar Pen, Inject 42 Units into the skin daily at 10 pm. (Patient taking differently: Inject 46 Units into the skin daily at 10 pm. ), Disp: 15 mL, Rfl: 11 .  Insulin Pen Needle (B-D ULTRAFINE  III SHORT PEN) 31G X 8 MM MISC, USE AS DIRECTED, Disp: 300 each, Rfl: 3 .  levothyroxine (SYNTHROID, LEVOTHROID) 150 MCG tablet, Take 1 tablet (150 mcg total) by mouth daily., Disp: 90 tablet, Rfl: 3 .  losartan (COZAAR) 100 MG tablet, Take 1 tablet (100 mg total) by mouth daily., Disp: 90 tablet, Rfl: 3 .  metFORMIN (GLUCOPHAGE) 1000 MG tablet, Take 1 tablet (1,000 mg total) by mouth 2 (two) times daily with a meal., Disp: 180 tablet, Rfl: 3 .  nitroGLYCERIN (NITROSTAT) 0.4 MG SL tablet, Place 1 tablet (0.4 mg total) under the tongue every 5 (five) minutes as needed for chest pain., Disp: 30 tablet, Rfl: 11 .  rosuvastatin (CRESTOR) 10 MG tablet, Take 1 tablet (10 mg total) by mouth daily., Disp: 90 tablet, Rfl: 3 .  sitaGLIPtin (JANUVIA) 100 MG tablet, Take 1 tablet (100 mg total) by mouth daily., Disp: 90 tablet, Rfl: 3 .  traMADol (ULTRAM) 50 MG tablet, Take 1 tablet (50 mg total) by mouth every 8 (eight) hours as needed., Disp: 60 tablet, Rfl: 0  Not on  File   Review of Systems  Constitutional: Negative for chills, diaphoresis, fever, malaise/fatigue and weight loss.  HENT: Positive for congestion. Negative for hearing loss, nosebleeds and tinnitus.   Eyes: Negative for blurred vision and double vision.  Respiratory: Negative for cough, hemoptysis, shortness of breath and wheezing.   Cardiovascular: Negative for chest pain, palpitations and leg swelling.  Gastrointestinal: Negative for abdominal pain, blood in stool and heartburn.  Genitourinary: Negative for dysuria, frequency and urgency.  Skin: Negative for rash.  Neurological: Negative for dizziness, tingling, tremors, weakness and headaches.      Objective  Vitals:   05/06/16 1132 05/06/16 1304  BP: (!) 147/60 135/65  Pulse: 60   Resp: 16   Temp: 97.9 F (36.6 C)   TempSrc: Oral   Weight: 269 lb (122 kg)   Height: 5\' 7"  (1.702 m)     Physical Exam  Constitutional: She is oriented to person, place, and time and well-developed, well-nourished, and in no distress. No distress.  HENT:  Head: Normocephalic and atraumatic.  Eyes: Conjunctivae and EOM are normal. Pupils are equal, round, and reactive to light. No scleral icterus.  Neck: Normal range of motion. Neck supple. Carotid bruit is not present. No thyromegaly present.  Cardiovascular: Normal rate and normal heart sounds.  Exam reveals no gallop and no friction rub.   No murmur heard. Pulmonary/Chest: Effort normal and breath sounds normal. No respiratory distress. She has no wheezes. She has no rales.  Musculoskeletal: She exhibits no edema.  Lymphadenopathy:    She has no cervical adenopathy.  Neurological: She is alert and oriented to person, place, and time.  Vitals reviewed.      Recent Results (from the past 2160 hour(s))  TB Skin Test     Status: Normal   Collection Time: 03/25/16 11:15 AM  Result Value Ref Range   TB Skin Test Negative    Induration 0 mm  POCT HgB A1C     Status: Abnormal    Collection Time: 03/25/16 11:30 AM  Result Value Ref Range   Hemoglobin A1C 6.9%      Assessment & Plan  Problem List Items Addressed This Visit      Cardiovascular and Mediastinum   Atherosclerosis of native coronary artery of native heart without angina pectoris   Relevant Medications   amLODipine (NORVASC) 2.5 MG tablet   Essential (primary) hypertension  Relevant Medications   amLODipine (NORVASC) 2.5 MG tablet     Respiratory   Obstructive sleep apnea on CPAP     Endocrine   Diabetes mellitus type 2 with complications, uncontrolled (HCC) - Primary   Hypothyroid     Genitourinary   Chronic kidney disease (CKD) stage G3a/A1, moderately decreased glomerular filtration rate (GFR) between 45-59 mL/min/1.73 square meter and albuminuria creatinine ratio less than 30 mg/g      Meds ordered this encounter  Medications  . amLODipine (NORVASC) 2.5 MG tablet    Sig: Take 1 tablet (2.5 mg total) by mouth daily.    Dispense:  90 tablet    Refill:  3   1. Uncontrolled type 2 diabetes mellitus with complication, with long-term current use of insulin (HCC) Cont meds 2. Essential (primary) hypertension Cont other meds - amLODipine (NORVASC) 2.5 MG tablet; Take 1 tablet (2.5 mg total) by mouth daily.  Dispense: 90 tablet; Refill: 3  3. Atherosclerosis of native coronary artery of native heart without angina pectoris   4. Obstructive sleep apnea on CPAP   5. Postoperative hypothyroidism Cont med 6. Chronic kidney disease (CKD) stage G3a/A1, moderately decreased glomerular filtration rate (GFR) between 45-59 mL/min/1.73 square meter and albuminuria creatinine ratio less than 30 mg/g

## 2016-07-21 NOTE — Progress Notes (Signed)
Cardiology Office Note  Date:  07/22/2016   ID:  Sumer, [place] October 13, 1945, MRN [id number]  PCP:  Olin Hauser, DO   Chief Complaint  Patient presents with  . other    6 month f/u no complaints today. Meds reviewed verbally with pt.    HPI:   71 year old female  CAD June 2015, anterior ST elevation MI in [place]  thrombectomy and drug-eluting stent placement to the LAD.  EF at that time was 45%  echocardiogram in October 2015 showed normalization of LV function, at 55-60%.  hypertension, hyperlipidemia,  diabetes morbid obesity son passed in December 2016. She presents for follow-up of her coronary artery disease  Previous labs reviewed personally by myself and with the patient on todays visit HBA1C 6.9 Total chol 184, LDL 91  Weight down 13 pounds since previous lab work Following a high protein diet doesn't exercise,. no chest pain or pressure.   degree of chronic dyspnea on exertion She denies PND, orthopnea, dizziness, syncope, palpitations, edema, or early satiety.  EKG personally reviewed by myself on todays visit Shows sinus rhythm with rate 66 bpm no significant ST or T-wave changes   PMH:   has a past medical history of Arthritis; Coronary artery disease; Diabetes mellitus, type II ([place]); Endometrial cancer ([place]); Gravida 3 para 3; Hyperlipidemia; Hypertensive heart disease; Hypothyroid; Ischemic cardiomyopathy; Menopause; Obstructive sleep apnea on CPAP; and Osteoarthritis.  PSH:    Past Surgical History:  Procedure Laterality Date  . ABDOMINAL HYSTERECTOMY    . CARDIAC CATHETERIZATION    . CORONARY ANGIOPLASTY     drug eluting stent placed at [place]    . LAPAROSCOPIC HYSTERECTOMY    . THYROIDECTOMY      Current Outpatient Prescriptions  Medication Sig Dispense Refill  . acetaminophen (TYLENOL) 500 MG tablet Take 2 tablets by mouth 3 (three) times daily as needed.    Marland Kitchen amLODipine  (NORVASC) 2.5 MG tablet Take 1 tablet (2.5 mg total) by mouth daily. 90 tablet 3  . aspirin 81 MG tablet Take 81 mg by mouth daily.    . carvedilol (COREG) 3.125 MG tablet Take 1 tablet (3.125 mg total) by mouth 2 (two) times daily. 180 tablet 3  . FLUoxetine (PROZAC) 10 MG capsule TAKE 1 TABLET BY MOUTH EVERY DAY. NEED TO MAKE APPOINTMENT WITH DOCTOR FOR MORE REFILLS 90 capsule 3  . glucose blood test strip 1 strip 3 (three) times daily.    . Insulin Glargine (LANTUS SOLOSTAR) 100 UNIT/ML Solostar Pen Inject 42 Units into the skin daily at 10 pm. (Patient taking differently: Inject 46 Units into the skin daily at 10 pm. ) 15 mL 11  . Insulin Pen Needle (B-D ULTRAFINE III SHORT PEN) 31G X 8 MM MISC USE AS DIRECTED 300 each 3  . levothyroxine (SYNTHROID, LEVOTHROID) 150 MCG tablet Take 1 tablet (150 mcg total) by mouth daily. 90 tablet 3  . losartan (COZAAR) 100 MG tablet Take 1 tablet (100 mg total) by mouth daily. 90 tablet 3  . metFORMIN (GLUCOPHAGE) 1000 MG tablet Take 1 tablet (1,000 mg total) by mouth 2 (two) times daily with a meal. 180 tablet 3  . nitroGLYCERIN (NITROSTAT) 0.4 MG SL tablet Place 1 tablet (0.4 mg total) under the tongue every 5 (five) minutes as needed for chest pain. 30 tablet 11  . rosuvastatin (CRESTOR) 20 MG tablet Take 1 tablet (20 mg total) by mouth daily. 90 tablet 3  .  sitaGLIPtin (JANUVIA) 100 MG tablet Take 1 tablet (100 mg total) by mouth daily. 90 tablet 3  . traMADol (ULTRAM) 50 MG tablet Take 1 tablet (50 mg total) by mouth every 8 (eight) hours as needed. 60 tablet 0   No current facility-administered medications for this visit.      Allergies:   Patient has no allergy information on record.   Social History:  The patient  reports that she has never smoked. She has never used smokeless tobacco. She reports that she does not drink alcohol or use drugs.   Family History:   family history includes Heart attack in her father; Heart disease (age of onset: 65)  in her father; Heart failure in her father; Hyperlipidemia in her father; Hypertension in her father.    Review of Systems: Review of Systems  Constitutional: Negative.   Respiratory: Negative.   Cardiovascular: Negative.   Gastrointestinal: Negative.   Musculoskeletal: Negative.   Neurological: Negative.   Psychiatric/Behavioral: Negative.   All other systems reviewed and are negative.    PHYSICAL EXAM: VS:  BP 112/60 (BP Location: Left Arm, Patient Position: Sitting, Cuff Size: Large)   Pulse 66   Ht 5\' 6"  (1.676 m)   Wt 263 lb 4 oz (119.4 kg)   BMI 42.49 kg/m  , BMI Body mass index is 42.49 kg/m. GEN: Well nourished, well developed, in no acute distress , obese HEENT: normal  Neck: no JVD, carotid bruits, or masses Cardiac: RRR; no murmurs, rubs, or gallops,no edema  Respiratory:  clear to auscultation bilaterally, normal work of breathing GI: soft, nontender, nondistended, + BS MS: no deformity or atrophy  Skin: warm and dry, no rash Neuro:  Strength and sensation are intact Psych: euthymic mood, full affect   Recent Labs: 09/03/2015: ALT 11 12/11/2015: BUN 21; Creat 1.03; Potassium 4.9; Sodium 139; TSH 0.78    Lipid Panel Lab Results  Component Value Date   CHOL 184 12/11/2015   HDL 63 12/11/2015   LDLCALC 91 12/11/2015   TRIG 152 (H) 12/11/2015      Wt Readings from Last 3 Encounters:  07/22/16 263 lb 4 oz (119.4 kg)  05/06/16 269 lb (122 kg)  03/25/16 276 lb (125.2 kg)       ASSESSMENT AND PLAN:  Coronary artery disease of native artery of native heart with stable angina pectoris ([place]) - Plan: EKG 12-Lead Currently with no symptoms of angina. No further workup at this time.  Recommended that she increase her Crestor up to 20 minute grams daily  Essential (primary) hypertension - Plan: EKG 12-Lead Suggested she consider holding her amlodipine given borderline low blood pressure and recent weight loss  Mixed hyperlipidemia - Plan: EKG  12-Lead Goal LDL less than 70, will increase Crestor up to 20 daily  Type 2 diabetes mellitus with other circulatory complication, with long-term current use of insulin ([place]) - Plan: EKG 12-Lead Hemoglobin A1c 6.9, since then 13 pound weight loss Plan is to continue further weight loss  Chronic kidney disease (CKD) stage G3a/A1, moderately decreased glomerular filtration rate (GFR) between 45-59 mL/min/1.73 square meter and albuminuria creatinine ratio less than 30 mg/g  Obstructive sleep apnea on CPAP Compliant with her cpap  Morbid obesity ([place]) We have encouraged continued exercise, careful diet management in an effort to lose weight.  History of coronary artery stent placement - Plan: EKG 12-Lead Continue on aspirin  Ischemic cardiomyopathy - Plan: EKG 12-Lead  appears euvolemic no changes to her medication  Total encounter time more than 25 minutes  Greater than 50% was spent in counseling and coordination of care with the patient ,  Disposition:   F/U  12 months   Orders Placed This Encounter  Procedures  . EKG 12-Lead     Signed, Esmond Plants, M.D., Ph.D. 07/22/2016  [place], [place]

## 2016-07-22 ENCOUNTER — Encounter: Payer: Self-pay | Admitting: Cardiovascular Disease

## 2016-07-22 ENCOUNTER — Ambulatory Visit (INDEPENDENT_AMBULATORY_CARE_PROVIDER_SITE_OTHER): Payer: Medicare Other | Admitting: Cardiovascular Disease

## 2016-07-22 VITALS — BP 112/60 | HR 66 | Ht 66.0 in | Wt 263.2 lb

## 2016-07-22 DIAGNOSIS — E1159 Type 2 diabetes mellitus with other circulatory complications: Secondary | ICD-10-CM | POA: Diagnosis not present

## 2016-07-22 DIAGNOSIS — N1831 Chronic kidney disease, stage 3a: Secondary | ICD-10-CM

## 2016-07-22 DIAGNOSIS — E118 Type 2 diabetes mellitus with unspecified complications: Secondary | ICD-10-CM

## 2016-07-22 DIAGNOSIS — Z955 Presence of coronary angioplasty implant and graft: Secondary | ICD-10-CM

## 2016-07-22 DIAGNOSIS — N183 Chronic kidney disease, stage 3 (moderate): Secondary | ICD-10-CM | POA: Diagnosis not present

## 2016-07-22 DIAGNOSIS — I255 Ischemic cardiomyopathy: Secondary | ICD-10-CM | POA: Diagnosis not present

## 2016-07-22 DIAGNOSIS — E782 Mixed hyperlipidemia: Secondary | ICD-10-CM | POA: Diagnosis not present

## 2016-07-22 DIAGNOSIS — Z794 Long term (current) use of insulin: Secondary | ICD-10-CM | POA: Diagnosis not present

## 2016-07-22 DIAGNOSIS — I10 Essential (primary) hypertension: Secondary | ICD-10-CM | POA: Diagnosis not present

## 2016-07-22 DIAGNOSIS — I25118 Atherosclerotic heart disease of native coronary artery with other forms of angina pectoris: Secondary | ICD-10-CM | POA: Diagnosis not present

## 2016-07-22 DIAGNOSIS — G4733 Obstructive sleep apnea (adult) (pediatric): Secondary | ICD-10-CM | POA: Diagnosis not present

## 2016-07-22 DIAGNOSIS — IMO0002 Reserved for concepts with insufficient information to code with codable children: Secondary | ICD-10-CM

## 2016-07-22 DIAGNOSIS — Z9989 Dependence on other enabling machines and devices: Secondary | ICD-10-CM

## 2016-07-22 DIAGNOSIS — E1165 Type 2 diabetes mellitus with hyperglycemia: Secondary | ICD-10-CM

## 2016-07-22 MED ORDER — ROSUVASTATIN CALCIUM 20 MG PO TABS: 20.0000 mg | ORAL_TABLET | Freq: Every day | ORAL | 3 refills | Status: DC

## 2016-07-22 NOTE — Patient Instructions (Addendum)
Medication Instructions:   Please increase the  crestor up to 20 mg daily  Labwork:  No new labs needed  Testing/Procedures:  No further testing at this time   I recommend watching educational videos on topics of interest to you at:       [email or web address]  Enter code: [email or web address]    Follow-Up: It was a pleasure seeing you in the office today. Please call us if you have new issues that need to be addressed before your next appt.  [phone]  Your physician wants you to follow-up in: 12 months.  You will receive a reminder letter in the mail two months in advance. If you don't receive a letter, please call our office to schedule the follow-up appointment.  If you need a refill on your cardiac medications before your next appointment, please call your pharmacy.

## 2016-07-28 ENCOUNTER — Encounter: Payer: Self-pay | Admitting: *Deleted

## 2016-07-29 NOTE — Discharge Instructions (Signed)

## 2016-08-03 ENCOUNTER — Ambulatory Visit
Admission: RE | Admit: 2016-08-03 | Discharge: 2016-08-03 | Disposition: A | Discharge status: Home / Self Care | Payer: Medicare Other | Source: Ambulatory Visit | Attending: Family Medicine | Admitting: Family Medicine

## 2016-08-03 ENCOUNTER — Encounter: Payer: Self-pay | Admitting: Family Medicine

## 2016-08-03 ENCOUNTER — Ambulatory Visit (INDEPENDENT_AMBULATORY_CARE_PROVIDER_SITE_OTHER): Payer: Medicare Other | Admitting: Family Medicine

## 2016-08-03 VITALS — BP 158/49 | HR 66 | Temp 98.7°F | Resp 16 | Ht 66.0 in | Wt 264.0 lb

## 2016-08-03 DIAGNOSIS — J209 Acute bronchitis, unspecified: Secondary | ICD-10-CM | POA: Insufficient documentation

## 2016-08-03 DIAGNOSIS — R918 Other nonspecific abnormal finding of lung field: Secondary | ICD-10-CM | POA: Insufficient documentation

## 2016-08-03 DIAGNOSIS — I252 Old myocardial infarction: Secondary | ICD-10-CM | POA: Diagnosis not present

## 2016-08-03 DIAGNOSIS — R0609 Other forms of dyspnea: Secondary | ICD-10-CM

## 2016-08-03 MED ORDER — PREDNISONE 50 MG PO TABS: 50.0000 mg | ORAL_TABLET | Freq: Every day | ORAL | 0 refills | Status: DC

## 2016-08-03 MED ORDER — BENZONATATE 100 MG PO CAPS: 100.0000 mg | ORAL_CAPSULE | Freq: Three times a day (TID) | ORAL | 0 refills | Status: DC | PRN

## 2016-08-03 MED ORDER — LEVOFLOXACIN 500 MG PO TABS: 500.0000 mg | ORAL_TABLET | Freq: Every day | ORAL | 0 refills | Status: DC

## 2016-08-03 NOTE — Progress Notes (Signed)
Subjective:    Patient ID: [name], female    DOB: 1945-12-26, 71 y.o.   MRN: [id number]  [name] is a 71 y.o. female presenting on 08/03/2016 for Cough (SOB started with sore throat which improved onset Friday but now it's going down in her chest cough getting SOB )  Patient presents for a same day appointment.  HPI   DYSPNEA / COUGH / BRONCHITIS WITH BRONCHOSPASM Reports symptoms started 4 days ago with sore throat after waking up, then next day had worsening symptoms with persistent coughing spells, worse at night if laying down. - Tried OTC Coricidin (did not like way this made her feel and stopped it), Mucinex-DM regularly - History of OSA with CPAP, felt difficulty wearing this now with cough and congestion - No known history of COPD. Never smoker, but had 20 years of second hand smoke >50 years ago from living with parents. - Followed by Cardiology Dr Rockey Situ, has history of prior STEMI in 2015, she was advised that overall check-up was good at recent visit on 07/22/16, med changes were increased Rosuvastatin, and advised maybe to stop Amlodipine but she has not done this one yet - Admits subjective fever and chills 2-3 days ago since resolved - Admits worsening dyspnea with exertion short distances but does admit some wheezing - Denies significant productive cough, edema, chest pain or pressure, nausea, vomiting, sweats, persistent fever/chills  Social History  Substance Use Topics  . Smoking status: Never Smoker  . Smokeless tobacco: Never Used  . Alcohol use No    Review of Systems Per HPI unless specifically indicated above     Objective:    BP (!) 158/49   Pulse 66   Temp 98.7 F (37.1 C) (Oral)   Resp 16   Ht 5\' 6"  (1.676 m)   Wt 264 lb (119.7 kg)   SpO2 95%   BMI 42.61 kg/m   Wt Readings from Last 3 Encounters:  08/03/16 264 lb (119.7 kg)  07/22/16 263 lb 4 oz (119.4 kg)  05/06/16 269 lb (122 kg)    Physical Exam  Constitutional: She is  oriented to person, place, and time. She appears well-developed and well-nourished. No distress.  Currently mildly ill-appearing, slightly uncomfortable due to cough and breathing, cooperative  HENT:  Head: Normocephalic and atraumatic.  Mouth/Throat: Oropharynx is clear and moist.  Frontal / maxillary sinuses non-tender. Nares patent without purulence or edema. Bilateral TMs clear without erythema, effusion or bulging. Oropharynx clear without erythema, exudates, edema or asymmetry.  Eyes: Conjunctivae are normal. Right eye exhibits no discharge. Left eye exhibits no discharge.  Neck: Normal range of motion. Neck supple.  Cardiovascular: Normal rate, regular rhythm, normal heart sounds and intact distal pulses.   No murmur heard. Pulmonary/Chest: Effort normal. No respiratory distress. She has wheezes (diffuse exp wheeze). She has no rales. She exhibits no tenderness.  Mild reduced air movement diffusely. Generalized expiratory wheezing bilateral lung fields, some coarse sounds, without focal crackles. Speaks full sentences. Occasional coughing spells.  Musculoskeletal: Normal range of motion. She exhibits no edema.  Lymphadenopathy:    She has no cervical adenopathy.  Neurological: She is alert and oriented to person, place, and time.  Skin: Skin is warm and dry. No rash noted. She is not diaphoretic. No erythema.  Psychiatric: She has a normal mood and affect. Her behavior is normal.  Nursing note and vitals reviewed.    Results for orders placed or performed in visit on 03/25/16  POCT HgB A1C  Result Value Ref Range   Hemoglobin A1C 6.9%       Assessment & Plan:   Problem List Items Addressed This Visit    History of ST elevation myocardial infarction (STEMI)    Stable without known recurrence or concern Followed by [facility] Cardiology Dr Rockey Situ, last seen 1 week ago with good check-up Current symptoms clinically seem less likely to be atypical presentation of cardiac etiology, given  focal wheezing on lung exam today and some sputum production, however advised patient cannot rule out cardiac etiology and if not responding to treatment within 24-48 hours or any significant worsening needs to seek additional help at our office, ED, or notify cardiology      Acute bronchitis with bronchospasm - Primary  Consistent with worsening bronchitis in setting of likely viral URI. Duration >4 days now worsening. No recent hospitalization or other infection. - Clinically hemodynamically stable, mild elevated BP, otherwise now afebrile, O2 95% on RA, exam without obvious focal sign of PNA (but cannot rule out). Concern with exp wheezing with coarse breath sounds on exam w/ bronchospasm (without history of COPD, but prior history of 2nd hand smoke >50 years ago)  Plan: 1. Check CXR today - evaluate for possible pneumonia vs COPD vs cardiac etiology 2. Start empiric antibiotics given high risk patient with DM2, CAD - start Levaquin 500mg  daily x 7 days for coverage of bronchitis/pneumonia 3. Also given rx Prednisone 50mg  daily x 5 days burst treatment given bronchospasm - start only if not improving within 24-48 hours as advised 4. Start Tessalon Perls take 1 capsule up to 3 times a day as needed for cough 5. Recommend trial OTC - Mucinex-DM, Tylenol PRN, Nasal saline, lozenges, tea with honey/lemon 6. Return criteria reviewed, follow-up within 1 week if not improved - or other return criteria reviewed    Relevant Medications   benzonatate (TESSALON) 100 MG capsule   levofloxacin (LEVAQUIN) 500 MG tablet   predniSONE (DELTASONE) 50 MG tablet   Other Relevant Orders   DG Chest 2 View    Other Visit Diagnoses    Dyspnea on exertion      Suspected secondary to bronchospasm now with bronchitis, clinically seems less likely to be secondary to cardiac etiology, see above A&P    Relevant Orders   DG Chest 2 View      Meds ordered this encounter  Medications  . rosuvastatin (CRESTOR) 10  MG tablet  . benzonatate (TESSALON) 100 MG capsule    Sig: Take 1 capsule (100 mg total) by mouth 3 (three) times daily as needed for cough.    Dispense:  30 capsule    Refill:  0  . levofloxacin (LEVAQUIN) 500 MG tablet    Sig: Take 1 tablet (500 mg total) by mouth daily. For 7 days    Dispense:  7 tablet    Refill:  0  . predniSONE (DELTASONE) 50 MG tablet    Sig: Take 1 tablet (50 mg total) by mouth daily with breakfast.    Dispense:  5 tablet    Refill:  0    Follow up plan: Return in about 1 week (around 08/10/2016), or if symptoms worsen or fail to improve, for bronchitis/pneumonia, dyspnea.  Nobie Putnam, DO [facility] 08/03/2016, 5:18 PM

## 2016-08-03 NOTE — Assessment & Plan Note (Signed)
Stable without known recurrence or concern Followed by [facility] Cardiology Dr Rockey Situ, last seen 1 week ago with good check-up Current symptoms clinically seem less likely to be atypical presentation of cardiac etiology, given focal wheezing on lung exam today and some sputum production, however advised patient cannot rule out cardiac etiology and if not responding to treatment within 24-48 hours or any significant worsening needs to seek additional help at our office, ED, or notify cardiology

## 2016-08-03 NOTE — Patient Instructions (Addendum)
Thank you for coming to the clinic today.  1. It sounds like you had an Upper Respiratory Virus that has settled into a Bronchitis, lower respiratory tract infection. I don't have concerns for pneumonia today, and think that this should gradually improve. Once you are feeling better, the cough may take a few weeks to fully resolve. I do hear wheezing and coarse breath sounds, this may be due to the virus  We checked Chest X-ray today will notify you within 24 hours with results  - Start antibiotic Levaquin 500mg  daily for 1 week  - Start Tessalon Perls take 1 capsule up to 3 times a day as needed for cough  - Continue Mucinex for up to 1 week  - If not improved breathing within 24-48 hours then start Prednisone 50mg  daily for next 5 days - this will open up lungs allow you to breath better and treat that wheezing or bronchospasm  - Use nasal saline (Simply Saline or Ocean Spray) to flush nasal congestion multiple times a day, may help cough - Drink plenty of fluids to improve congestion  If your symptoms seem to worsen instead of improve over next several days, including significant fever / chills, worsening shortness of breath, worsening wheezing, or nausea / vomiting and can't take medicines - return sooner or go to hospital Emergency Department for more immediate treatment.  If you have any significant chest pain that does not go away within 30 minutes, is accompanied by nausea, sweating, shortness of breath, or made worse by activity, this may be evidence of a heart attack, especially if symptoms worsening instead of improving, please call 911 or go directly to the emergency room immediately for evaluation.  Please schedule a Follow-up Appointment to: Return in about 1 week (around 08/10/2016), or if symptoms worsen or fail to improve, for bronchitis/pneumonia, dyspnea.  If you have any other questions or concerns, please feel free to call the clinic or send a message through [place]. You  may also schedule an earlier appointment if necessary.  Nobie Putnam, DO [place]

## 2016-08-08 ENCOUNTER — Encounter: Payer: Self-pay | Admitting: Family Medicine

## 2016-08-09 ENCOUNTER — Other Ambulatory Visit: Payer: Self-pay

## 2016-08-09 MED ORDER — GLUCOSE BLOOD VI STRP: ORAL_STRIP | 12 refills | Status: DC

## 2016-08-11 ENCOUNTER — Ambulatory Visit (INDEPENDENT_AMBULATORY_CARE_PROVIDER_SITE_OTHER): Payer: Medicare Other | Admitting: Family Medicine

## 2016-08-11 ENCOUNTER — Encounter: Payer: Self-pay | Admitting: Family Medicine

## 2016-08-11 VITALS — BP 128/65 | HR 59 | Temp 98.5°F | Resp 16 | Ht 66.0 in | Wt 264.0 lb

## 2016-08-11 DIAGNOSIS — J189 Pneumonia, unspecified organism: Secondary | ICD-10-CM

## 2016-08-11 DIAGNOSIS — J209 Acute bronchitis, unspecified: Secondary | ICD-10-CM

## 2016-08-11 DIAGNOSIS — J181 Lobar pneumonia, unspecified organism: Secondary | ICD-10-CM

## 2016-08-11 NOTE — Patient Instructions (Addendum)
Thank you for coming to the clinic today.  I think you are much improved. Lungs are clear today.  Keep up the good work  Cough and congestion symptoms should continue to gradually improve.  May try OTC Loratadine or Cetirizine once daily for anti-histamine dry up congestion, these are allergy meds  Also can try OTC Flonase nasal steroid 2 sprays each nostril daily for several weeks to help resolve lingering.  Please schedule a Follow-up Appointment to: Return in about 5 weeks (around 09/15/2016) for diabetes.   Plan on next visit in October 2018 for labs and annual physical  If you have any other questions or concerns, please feel free to call the clinic or send a message through [place]. You may also schedule an earlier appointment if necessary.  Nobie Putnam, DO [place]

## 2016-08-11 NOTE — Assessment & Plan Note (Signed)
Resolving, significantly improved s/p antibiotics and prednisone. Suggestive of possible very mild COPD or increased risk of bronchitis. Non smoker, only 20 years passive 2nd hand smoke. - CXR with LUL patchy infiltrate - Reassurance given no cardiac or exertional symptoms at all  Plan: 1. Reassurance, no repeat antibiotics, she should continue to improve now symptoms likely self limited with lingering cough / congestion, some post nasal drainage can continue with allergic rhinitis 2. May try OTC Loratadine vs Cetirizine daily, and add OTC Flonase - consider rx in future, may need Atrovent PRN 3. No further cold/cough meds at this time 4. Advised we could consider repeat Chest X-ray to ensure complete resolution based on radiology recommendation in 4-6 weeks since 08/03/16 5. Follow-up only PRN if not improved

## 2016-08-11 NOTE — Progress Notes (Signed)
Subjective:    Patient ID: Modena Nunnery, female    DOB: July 31, 1945, 71 y.o.   MRN: [id number]  VICKI CHAFFIN is a 71 y.o. female presenting on 08/11/2016 for Cough (follow up improved from past)  HPI   FOLLOW-UP COMMUNITY ACQUIRED PNEUMONIA vs Bronchitis: - Last visit with me 08/03/16, for same problem initial visit, treated with Levaquin 500mg  daily x 7 days, Prednisone 50mg  daily x 5 days, Tessalon Perls, had CXR that showed possible LUL patchy infiltrate pneumonia, see prior notes for background information. - Interval update with completed her antibiotic and prednisone. No longer taking Tessalon as her pills "melted" in the car. - Today patient reports overall much improved about 85% better. Still has occasional cough and congestion with postnasal drainage symptoms, but her breathing is resolved and feels like she can catch her breath and no longer any dyspnea. Also resolved fevers/chills. - No new concerns today. Asking about future plans for follow-up Diabetes and Annual Physical, to be scheduled - Denies recurrence or productive cough, edema, chest pain or pressure, nausea, vomiting, sweats, persistent fever/chills  Social History  Substance Use Topics  . Smoking status: Never Smoker  . Smokeless tobacco: Never Used  . Alcohol use No    Review of Systems Per HPI unless specifically indicated above     Objective:    BP 128/65   Pulse (!) 59   Temp 98.5 F (36.9 C) (Oral)   Resp 16   Ht 5\' 6"  (1.676 m)   Wt 264 lb (119.7 kg)   SpO2 98%   BMI 42.61 kg/m   Wt Readings from Last 3 Encounters:  08/11/16 264 lb (119.7 kg)  08/03/16 264 lb (119.7 kg)  07/22/16 263 lb 4 oz (119.4 kg)    Physical Exam  Constitutional: She is oriented to person, place, and time. She appears well-developed and well-nourished. No distress.  Currently well-appearing, now comfortable, cooperative  HENT:  Head: Normocephalic and atraumatic.  Mouth/Throat: Oropharynx is clear and moist.    Frontal / maxillary sinuses non-tender. Nares patent with minimal congestion without purulence or edema. Oropharynx very mild residual postnasal drainage without erythema, exudates, edema or asymmetry.  Eyes: Conjunctivae are normal. Right eye exhibits no discharge. Left eye exhibits no discharge.  Neck: Normal range of motion. Neck supple.  Cardiovascular: Normal rate, regular rhythm, normal heart sounds and intact distal pulses.   No murmur heard. Pulmonary/Chest: Effort normal and breath sounds normal. No respiratory distress. She has no wheezes (Resolved). She has no rales. She exhibits no tenderness.  Good air movement now, speaks full sentences. No further wheezing. No focal abnormality heard  Musculoskeletal: She exhibits no edema.  Lymphadenopathy:    She has no cervical adenopathy.  Neurological: She is alert and oriented to person, place, and time.  Skin: Skin is warm and dry. No rash noted. She is not diaphoretic. No erythema.  Psychiatric: She has a normal mood and affect. Her behavior is normal.  Nursing note and vitals reviewed.  I have personally reviewed the radiology report from Chest X-ray on 08/03/16.  CLINICAL DATA:  Productive cough and wheezing for 4 days. Worsening dyspnea.  EXAM: CHEST  2 VIEW  COMPARISON:  None.  FINDINGS: The heart size and mediastinal contours are within normal limits. Aortic atherosclerosis.  Subtle patchy asymmetric airspace disease is seen in the left upper lobe, suspicious for pneumonia. Right lung is clear. No evidence of pneumothorax or pleural effusion.  IMPRESSION: Subtle patchy airspace disease in left  upper lobe, suspicious for pneumonia. Recommend clinical correlation; consider followup PA and lateral chest X-ray in 3-4 weeks following trial of antibiotic therapy to ensure resolution and exclude underlying malignancy.   Electronically Signed   By: Earle Gell M.D.   On: 08/04/2016 08:08    Results for orders  placed or performed in visit on 03/25/16  POCT HgB A1C  Result Value Ref Range   Hemoglobin A1C 6.9%       Assessment & Plan:   Problem List Items Addressed This Visit    RESOLVED: Community Acquired pneumonia of LUL - Primary    Resolving, significantly improved s/p antibiotics and prednisone. Suggestive of possible very mild COPD or increased risk of bronchitis. Non smoker, only 20 years passive 2nd hand smoke. - CXR with LUL patchy infiltrate - Reassurance given no cardiac or exertional symptoms at all  Plan: 1. Reassurance, no repeat antibiotics, she should continue to improve now symptoms likely self limited with lingering cough / congestion, some post nasal drainage can continue with allergic rhinitis 2. May try OTC Loratadine vs Cetirizine daily, and add OTC Flonase - consider rx in future, may need Atrovent PRN 3. No further cold/cough meds at this time 4. Advised we could consider repeat Chest X-ray to ensure complete resolution based on radiology recommendation in 4-6 weeks since 08/03/16 5. Follow-up only PRN if not improved       Other Visit Diagnoses    Bronchitis with bronchospasm         No orders of the defined types were placed in this encounter.   Follow up plan: Return in about 5 weeks (around 09/15/2016) for diabetes.   Next visit DM A1c only, then will schedule 11/2016 for Annual Physical and future fasting labs.  Nobie Putnam, [facility] 08/11/2016, 12:34 PM

## 2016-08-30 ENCOUNTER — Encounter
Admission: RE | Disposition: A | Discharge status: Home / Self Care | Payer: Self-pay | Source: Ambulatory Visit | Attending: Ophthalmology

## 2016-08-30 ENCOUNTER — Ambulatory Visit
Admission: RE | Admit: 2016-08-30 | Discharge: 2016-08-30 | Disposition: A | Discharge status: Home / Self Care | Payer: Medicare Other | Source: Ambulatory Visit | Attending: Ophthalmology | Admitting: Ophthalmology

## 2016-08-30 ENCOUNTER — Ambulatory Visit: Payer: Medicare Other | Admitting: Anesthesiology

## 2016-08-30 DIAGNOSIS — H2512 Age-related nuclear cataract, left eye: Secondary | ICD-10-CM | POA: Insufficient documentation

## 2016-08-30 DIAGNOSIS — M170 Bilateral primary osteoarthritis of knee: Secondary | ICD-10-CM | POA: Insufficient documentation

## 2016-08-30 DIAGNOSIS — F329 Major depressive disorder, single episode, unspecified: Secondary | ICD-10-CM | POA: Diagnosis not present

## 2016-08-30 DIAGNOSIS — E7800 Pure hypercholesterolemia, unspecified: Secondary | ICD-10-CM | POA: Diagnosis not present

## 2016-08-30 DIAGNOSIS — Z85850 Personal history of malignant neoplasm of thyroid: Secondary | ICD-10-CM | POA: Insufficient documentation

## 2016-08-30 DIAGNOSIS — E039 Hypothyroidism, unspecified: Secondary | ICD-10-CM | POA: Diagnosis not present

## 2016-08-30 DIAGNOSIS — I252 Old myocardial infarction: Secondary | ICD-10-CM | POA: Diagnosis not present

## 2016-08-30 DIAGNOSIS — Z8541 Personal history of malignant neoplasm of cervix uteri: Secondary | ICD-10-CM | POA: Insufficient documentation

## 2016-08-30 DIAGNOSIS — G4730 Sleep apnea, unspecified: Secondary | ICD-10-CM | POA: Insufficient documentation

## 2016-08-30 DIAGNOSIS — E119 Type 2 diabetes mellitus without complications: Secondary | ICD-10-CM | POA: Diagnosis not present

## 2016-08-30 DIAGNOSIS — I10 Essential (primary) hypertension: Secondary | ICD-10-CM | POA: Diagnosis not present

## 2016-08-30 DIAGNOSIS — Z6841 Body Mass Index (BMI) 40.0 and over, adult: Secondary | ICD-10-CM | POA: Diagnosis not present

## 2016-08-30 DIAGNOSIS — I2510 Atherosclerotic heart disease of native coronary artery without angina pectoris: Secondary | ICD-10-CM | POA: Diagnosis not present

## 2016-08-30 DIAGNOSIS — Z955 Presence of coronary angioplasty implant and graft: Secondary | ICD-10-CM | POA: Diagnosis not present

## 2016-08-30 HISTORY — PX: CATARACT EXTRACTION W/PHACO: SHX586

## 2016-08-30 HISTORY — PX: CATARACT EXTRACTION: SUR2

## 2016-08-30 LAB — GLUCOSE, CAPILLARY
GLUCOSE-CAPILLARY: 97 mg/dL (ref 65–99)
GLUCOSE-CAPILLARY: 102 mg/dL — AB (ref 65–99)

## 2016-08-30 SURGERY — PHACOEMULSIFICATION, CATARACT, WITH IOL INSERTION
Anesthesia: Monitor Anesthesia Care | Site: Eye | Laterality: Left | Wound class: Clean

## 2016-08-30 MED ORDER — ARMC OPHTHALMIC DILATING DROPS
1.0000 "application " | OPHTHALMIC | Status: DC | PRN
  Administered 2016-08-30 (×3): 1 via OPHTHALMIC

## 2016-08-30 MED ORDER — EPINEPHRINE PF 1 MG/ML IJ SOLN
INTRAOCULAR | Status: DC | PRN
  Administered 2016-08-30: 08:00:00 via OPHTHALMIC

## 2016-08-30 MED ORDER — MOXIFLOXACIN HCL 0.5 % OP SOLN
1.0000 [drp] | OPHTHALMIC | Status: DC | PRN
  Administered 2016-08-30 (×3): 1 [drp] via OPHTHALMIC

## 2016-08-30 MED ORDER — CEFUROXIME OPHTHALMIC INJECTION 1 MG/0.1 ML
INJECTION | OPHTHALMIC | Status: DC | PRN
  Administered 2016-08-30: 0.1 mL via INTRACAMERAL

## 2016-08-30 MED ORDER — LACTATED RINGERS IV SOLN: INTRAVENOUS | Status: DC

## 2016-08-30 MED ORDER — ACETAMINOPHEN 325 MG PO TABS
325.0000 mg | ORAL_TABLET | ORAL | Status: DC | PRN
Start: 2016-08-30 — End: 2016-08-30

## 2016-08-30 MED ORDER — MIDAZOLAM HCL 2 MG/2ML IJ SOLN
INTRAMUSCULAR | Status: DC | PRN
  Administered 2016-08-30: 2 mg via INTRAVENOUS

## 2016-08-30 MED ORDER — NA HYALUR & NA CHOND-NA HYALUR 0.4-0.35 ML IO KIT
PACK | INTRAOCULAR | Status: DC | PRN
  Administered 2016-08-30: 1 mL via INTRAOCULAR

## 2016-08-30 MED ORDER — BRIMONIDINE TARTRATE-TIMOLOL 0.2-0.5 % OP SOLN
OPHTHALMIC | Status: DC | PRN
  Administered 2016-08-30: 1 [drp] via OPHTHALMIC

## 2016-08-30 MED ORDER — LIDOCAINE HCL (PF) 4 % IJ SOLN
INTRAOCULAR | Status: DC | PRN
  Administered 2016-08-30: .5 mL via OPHTHALMIC

## 2016-08-30 MED ORDER — ACETAMINOPHEN 160 MG/5ML PO SOLN: 325.0000 mg | ORAL | Status: DC | PRN

## 2016-08-30 MED ORDER — FENTANYL CITRATE (PF) 100 MCG/2ML IJ SOLN
INTRAMUSCULAR | Status: DC | PRN
  Administered 2016-08-30: 50 ug via INTRAVENOUS

## 2016-08-30 SURGICAL SUPPLY — 25 items
CANNULA ANT/CHMB 27GA (MISCELLANEOUS) ×2 IMPLANT
CARTRIDGE ABBOTT (MISCELLANEOUS) IMPLANT
GLOVE SURG LX 7.5 STRW (GLOVE) ×2
GLOVE SURG LX STRL 7.5 STRW (GLOVE) ×2 IMPLANT
GLOVE SURG TRIUMPH 8.0 PF LTX (GLOVE) ×2 IMPLANT
GOWN STRL REUS W/ TWL LRG LVL3 (GOWN DISPOSABLE) ×2 IMPLANT
GOWN STRL REUS W/TWL LRG LVL3 (GOWN DISPOSABLE) ×2
LENS IOL TECNIS ITEC 9.0 (Intraocular Lens) ×2 IMPLANT
MARKER SKIN DUAL TIP RULER LAB (MISCELLANEOUS) ×2 IMPLANT
NDL RETROBULBAR .5 NSTRL (NEEDLE) IMPLANT
NEEDLE FILTER BLUNT 18X 1/2SAF (NEEDLE) ×1
NEEDLE FILTER BLUNT 18X1 1/2 (NEEDLE) ×1 IMPLANT
PACK CATARACT BRASINGTON (MISCELLANEOUS) ×2 IMPLANT
PACK EYE AFTER SURG (MISCELLANEOUS) ×2 IMPLANT
PACK OPTHALMIC (MISCELLANEOUS) ×2 IMPLANT
RING MALYGIN 7.0 (MISCELLANEOUS) IMPLANT
SUT ETHILON 10-0 CS-B-6CS-B-6 (SUTURE)
SUT VICRYL  9 0 (SUTURE)
SUT VICRYL 9 0 (SUTURE) IMPLANT
SUTURE EHLN 10-0 CS-B-6CS-B-6 (SUTURE) IMPLANT
SYR 3ML LL SCALE MARK (SYRINGE) ×2 IMPLANT
SYR 5ML LL (SYRINGE) ×2 IMPLANT
SYR TB 1ML LUER SLIP (SYRINGE) ×2 IMPLANT
WATER STERILE IRR 250ML POUR (IV SOLUTION) ×2 IMPLANT
WIPE NON LINTING 3.25X3.25 (MISCELLANEOUS) ×2 IMPLANT

## 2016-08-30 NOTE — Anesthesia Postprocedure Evaluation (Signed)
Anesthesia Post Note  Patient: [name]  Procedure(s) Performed: Procedure(s) (LRB): CATARACT EXTRACTION PHACO AND INTRAOCULAR LENS PLACEMENT (IOC) Left daibetic (Left)  Patient location during evaluation: PACU Anesthesia Type: MAC Level of consciousness: awake and alert and oriented Pain management: satisfactory to patient Vital Signs Assessment: post-procedure vital signs reviewed and stable Respiratory status: spontaneous breathing, nonlabored ventilation and respiratory function stable Cardiovascular status: blood pressure returned to baseline and stable Postop Assessment: Adequate PO intake and No signs of nausea or vomiting Anesthetic complications: no    Raliegh Ip

## 2016-08-30 NOTE — Op Note (Signed)
OPERATIVE NOTE  [name] [id number] 08/30/2016   PREOPERATIVE DIAGNOSIS:  Nuclear sclerotic cataract left eye. H25.12   POSTOPERATIVE DIAGNOSIS:    Nuclear sclerotic cataract left eye.     PROCEDURE:  Phacoemusification with posterior chamber intraocular lens placement of the left eye   LENS:   Implant Name Type Inv. Item Serial No. Manufacturer Lot No. LRB No. Used  LENS IOL DIOP 09.0 - [id number] Intraocular Lens LENS IOL DIOP 09.0 [id number]   Left 1        ULTRASOUND TIME: 18  % of 1 minutes 20 seconds, CDE 14.5  SURGEON:  Wyonia Hough, MD   ANESTHESIA:  Topical with tetracaine drops and 2% Xylocaine jelly, augmented with 1% preservative-free intracameral lidocaine.    COMPLICATIONS:  None.   DESCRIPTION OF PROCEDURE:  The patient was identified in the holding room and transported to the operating room and placed in the supine position under the operating microscope.  The left eye was identified as the operative eye and it was prepped and draped in the usual sterile ophthalmic fashion.   A 1 millimeter clear-corneal paracentesis was made at the 1:30 position.  0.5 ml of preservative-free 1% lidocaine was injected into the anterior chamber.  The anterior chamber was filled with Viscoat viscoelastic.  A 2.4 millimeter keratome was used to make a near-clear corneal incision at the 10:30 position.  .  A curvilinear capsulorrhexis was made with a cystotome and capsulorrhexis forceps.  Balanced salt solution was used to hydrodissect and hydrodelineate the nucleus.   Phacoemulsification was then used in stop and chop fashion to remove the lens nucleus and epinucleus.  The remaining cortex was then removed using the irrigation and aspiration handpiece. Provisc was then placed into the capsular bag to distend it for lens placement.  A lens was then injected into the capsular bag.  The remaining viscoelastic was aspirated.   Wounds were hydrated with balanced salt  solution.  The anterior chamber was inflated to a physiologic pressure with balanced salt solution.  No wound leaks were noted. Cefuroxime 0.1 ml of a 10mg /ml solution was injected into the anterior chamber for a dose of 1 mg of intracameral antibiotic at the completion of the case.   Timolol and Brimonidine drops were applied to the eye.  The patient was taken to the recovery room in stable condition without complications of anesthesia or surgery.  [name] 08/30/2016, 8:08 AM

## 2016-08-30 NOTE — H&P (Signed)
The History and Physical notes are on paper, have been signed, and are to be scanned. The patient remains stable and unchanged from the H&P.   Previous H&P reviewed, patient examined, and there are no changes.  Dawanna Grauberger 08/30/2016 7:41 AM   

## 2016-08-30 NOTE — Transfer of Care (Signed)
Immediate Anesthesia Transfer of Care Note  Patient: [name]  Procedure(s) Performed: Procedure(s) with comments: CATARACT EXTRACTION PHACO AND INTRAOCULAR LENS PLACEMENT (IOC) Left daibetic (Left) - Diabetic - insulin and oral meds sleep apnea  Patient Location: PACU  Anesthesia Type: MAC  Level of Consciousness: awake, alert  and patient cooperative  Airway and Oxygen Therapy: Patient Spontanous Breathing and Patient connected to supplemental oxygen  Post-op Assessment: Post-op Vital signs reviewed, Patient's Cardiovascular Status Stable, Respiratory Function Stable, Patent Airway and No signs of Nausea or vomiting  Post-op Vital Signs: Reviewed and stable  Complications: No apparent anesthesia complications

## 2016-08-30 NOTE — Anesthesia Preprocedure Evaluation (Signed)
Anesthesia Evaluation  Patient identified by MRN, date of birth, ID band Patient awake    Reviewed: Allergy & Precautions, H&P , NPO status , Patient's Chart, lab work & pertinent test results  Airway Mallampati: III  TM Distance: >3 FB Neck ROM: full    Dental no notable dental hx.    Pulmonary sleep apnea ,    Pulmonary exam normal        Cardiovascular hypertension, + CAD  Normal cardiovascular exam     Neuro/Psych    GI/Hepatic   Endo/Other  diabetesHypothyroidism Morbid obesity  Renal/GU      Musculoskeletal   Abdominal   Peds  Hematology   Anesthesia Other Findings   Reproductive/Obstetrics                             Anesthesia Physical Anesthesia Plan  ASA: III  Anesthesia Plan: MAC   Post-op Pain Management:    Induction:   PONV Risk Score and Plan:   Airway Management Planned:   Additional Equipment:   Intra-op Plan:   Post-operative Plan:   Informed Consent: I have reviewed the patients History and Physical, chart, labs and discussed the procedure including the risks, benefits and alternatives for the proposed anesthesia with the patient or authorized representative who has indicated his/her understanding and acceptance.     Plan Discussed with:   Anesthesia Plan Comments:         Anesthesia Quick Evaluation

## 2016-08-30 NOTE — Anesthesia Procedure Notes (Signed)
Procedure Name: MAC Performed by: Mayme Genta Pre-anesthesia Checklist: Patient identified, Emergency Drugs available, Suction available, Timeout performed and Patient being monitored Patient Re-evaluated:Patient Re-evaluated prior to inductionOxygen Delivery Method: Nasal cannula Placement Confirmation: positive ETCO2

## 2016-08-31 ENCOUNTER — Encounter: Payer: Self-pay | Admitting: Ophthalmology

## 2016-09-17 ENCOUNTER — Other Ambulatory Visit: Payer: Self-pay | Admitting: Family Medicine

## 2016-09-17 DIAGNOSIS — Z1231 Encounter for screening mammogram for malignant neoplasm of breast: Secondary | ICD-10-CM

## 2016-09-22 ENCOUNTER — Encounter: Payer: Self-pay | Admitting: Family Medicine

## 2016-09-22 ENCOUNTER — Other Ambulatory Visit: Payer: Self-pay | Admitting: Family Medicine

## 2016-09-22 ENCOUNTER — Ambulatory Visit (INDEPENDENT_AMBULATORY_CARE_PROVIDER_SITE_OTHER): Payer: Medicare Other | Admitting: Family Medicine

## 2016-09-22 VITALS — BP 114/54 | HR 65 | Temp 98.6°F | Resp 16 | Ht 66.0 in | Wt 257.0 lb

## 2016-09-22 DIAGNOSIS — N183 Chronic kidney disease, stage 3 (moderate): Secondary | ICD-10-CM | POA: Diagnosis not present

## 2016-09-22 DIAGNOSIS — E785 Hyperlipidemia, unspecified: Secondary | ICD-10-CM | POA: Diagnosis not present

## 2016-09-22 DIAGNOSIS — Z0000 Encounter for general adult medical examination without abnormal findings: Secondary | ICD-10-CM

## 2016-09-22 DIAGNOSIS — E1169 Type 2 diabetes mellitus with other specified complication: Secondary | ICD-10-CM | POA: Diagnosis not present

## 2016-09-22 DIAGNOSIS — Z6841 Body Mass Index (BMI) 40.0 and over, adult: Secondary | ICD-10-CM

## 2016-09-22 DIAGNOSIS — N1831 Chronic kidney disease, stage 3a: Secondary | ICD-10-CM

## 2016-09-22 DIAGNOSIS — Z794 Long term (current) use of insulin: Secondary | ICD-10-CM | POA: Diagnosis not present

## 2016-09-22 DIAGNOSIS — E890 Postprocedural hypothyroidism: Secondary | ICD-10-CM

## 2016-09-22 DIAGNOSIS — D509 Iron deficiency anemia, unspecified: Secondary | ICD-10-CM

## 2016-09-22 DIAGNOSIS — E1121 Type 2 diabetes mellitus with diabetic nephropathy: Secondary | ICD-10-CM | POA: Diagnosis not present

## 2016-09-22 DIAGNOSIS — Z1159 Encounter for screening for other viral diseases: Secondary | ICD-10-CM

## 2016-09-22 LAB — POCT GLYCOSYLATED HEMOGLOBIN (HGB A1C): HEMOGLOBIN A1C: 7 — AB (ref ?–5.7)

## 2016-09-22 MED ORDER — METFORMIN HCL ER 500 MG PO TB24: 1000.0000 mg | ORAL_TABLET | Freq: Every day | ORAL | 3 refills | Status: DC

## 2016-09-22 NOTE — Assessment & Plan Note (Signed)
Well-controlled DM with A1c 7.0 (stable from prior 6.9, overall remains improved) Complications - CKD-III. No symptoms of hypoglycemia  Plan:  1. Discussion on DM management especially with wt and insulin use - agree to work on trial of new therapy - discussed GLP1, SGLT2 options for improved wt loss and lowering insulin due to continued titration down on Lantus and some concerns of potential risk of hypoglycemia. 2. Patient to contact insurance to check cost/coverage for GLP1 options Bydureon BCise, Trulicity, Victoza - notify office with preferred option - will send rx for 1 month supply with refill to try, consider 90 day in future, reviewed meds side effect benefits 3. Detailed plan for insulin reviewed - for now continue Lantus 30u nightly - decrease dose as advised if start GLP1 to 20u for week 1 then to 15u for week 2, and continue adjust pending results, goal to come off of lantus insulin possibly 4. Switch Metformin from 1000mg  daily (was not taking BID), to XR 500mg  x 2 tabs daily in AM for extended benefit, if cost issue will switch back to regular metformin 5. Continue Januvia 100mg  daily for now - may adjust if needed 6. Encourage improved lifestyle - low carb, low sugar diet, reduce portion size continue keto diet and other plans as reviewed, emphasized need to start regular exercise 7. Check CBG, bring log to next visit for review 8. Continue ASA, ARB, Statin 9. Due DM Foot exam next visit / Advised to schedule DM ophtho exam, send record 10. Follow-up 3 months for labs and annual phys will review DM meds at that time

## 2016-09-22 NOTE — Assessment & Plan Note (Signed)
Stable CKD from previous results Follow Cr trend, check labs in 3 months for physical Limit NSAIDs, improve hydration

## 2016-09-22 NOTE — Assessment & Plan Note (Signed)
Controlled cholesterol on statin and diet/lifestyle but limited exercise, however had borderline elevated TG >150. LDL was 90 but goal is < 70 per Cards Last lipid panel 11/2015 ASCVD risk score is elevated due to known DM, CAD  Plan: 1. Continue current meds - Rosuvastatin 20mg  daily - follow per Cards to start this med, there was problem with rx before 2. Continue ASA 81mg  for primary ASCVD risk reduction 3. Encourage improved lifestyle - low carb/cholesterol, reduce portion size, continue improving plan to start regular exercise 4. Follow-up 3 months with fasting labs and Annual Phys, lipids chemistry

## 2016-09-22 NOTE — Assessment & Plan Note (Addendum)
Some weight loss now with improved diet, still limited exercise DM2, HLD, obesity Plan to switch to GLP1 and lower lantus insulin to help weight profile Encouraged to continue wt loss plan with diet, now start to gradually improve exercise, consider referral to [facility] Dr Leafy Ro if needed

## 2016-09-22 NOTE — Progress Notes (Signed)
Subjective:    Patient ID: [name], female    DOB: March 28, 1945, 71 y.o.   MRN: [id number]  [name] is a 71 y.o. female presenting on 09/22/2016 for Diabetes (highest 120 and lowest 80)   HPI   CHRONIC DM, Type 2 with CKD-III Reports asking about insulin adjustment, previously A1c >13 years ago, now improved has been 7 range to 6.9 last time. CBGs: Avg 80-90s (< 100 avg), Low >80 (no hypoglycemia symptoms but she is on beta blocker coreg), High < 160. Checks CBGs x1 fasting AM Meds: Lantus 30u PM daily (on insulin for 1-2 years, she has titrated down from 46 previously last visit), Metformin 1000mg  daily (not taking BID, often). Januvia 100mg  (>2 years, on before started insulin) Reports good compliance except sometimes forget evening dose metformin. Tolerating well w/o side-effects Currently on ARB, ASA 81, Statin Lifestyle: - Diet (working on variety of healthy diets, DM and ketodiet and has gone to nutrition informational session from [employer])  - Exercise (Not much regular exercise, but is motivated to try to improve this) - Weight loss 7 lbs in about 3 weeks Denies hypoglycemia, polyuria, visual changes, numbness or tingling.  HYPERLIPIDEMIA / CAD - Followed by Cardiology Dr Rockey Situ, last apt 06/2016, stable without chest pain, had EKG, no further work-up was planned, she has recently increased Crestor from 10 to 20mg  per recommendation to reduce LDL < 70. Last lipids 11/2015. - Taking ASA 81mg  Lifestyle- see above - Denies chest pain, dyspnea  Social History  Substance Use Topics  . Smoking status: Never Smoker  . Smokeless tobacco: Never Used  . Alcohol use No    Review of Systems Per HPI unless specifically indicated above     Objective:    BP (!) 114/54 (BP Location: Left Arm, Patient Position: Sitting, Cuff Size: Large)   Pulse 65   Temp 98.6 F (37 C) (Oral)   Resp 16   Ht 5\' 6"  (1.676 m)   Wt 257 lb (116.6 kg)   BMI 41.48 kg/m   Wt Readings  from Last 3 Encounters:  09/22/16 257 lb (116.6 kg)  08/30/16 264 lb (119.7 kg)  08/11/16 264 lb (119.7 kg)    Physical Exam  Constitutional: She is oriented to person, place, and time. She appears well-developed and well-nourished. No distress.  Well-appearing, comfortable, cooperative, obese  HENT:  Head: Normocephalic and atraumatic.  Mouth/Throat: Oropharynx is clear and moist.  Eyes: Conjunctivae are normal. Right eye exhibits no discharge. Left eye exhibits no discharge.  Cardiovascular: Normal rate, regular rhythm, normal heart sounds and intact distal pulses.   No murmur heard. Pulmonary/Chest: Effort normal.  Musculoskeletal: She exhibits no edema.  Neurological: She is alert and oriented to person, place, and time.  Skin: Skin is warm and dry. No rash noted. She is not diaphoretic. No erythema.  Psychiatric: She has a normal mood and affect. Her behavior is normal.  Well groomed, good eye contact, normal speech and thoughts  Nursing note and vitals reviewed.   Results for orders placed or performed in visit on 09/22/16  POCT HgB A1C  Result Value Ref Range   Hemoglobin A1C 7.0 (A) 5.7      Assessment & Plan:   Problem List Items Addressed This Visit    Morbid obesity with BMI of 40.0-44.9, adult ([place])    Some weight loss now with improved diet, still limited exercise DM2, HLD, obesity Plan to switch to GLP1 and lower lantus insulin to help  weight profile Encouraged to continue wt loss plan with diet, now start to gradually improve exercise, consider referral to [facility] Dr Leafy Ro if needed      Relevant Medications   metFORMIN (GLUCOPHAGE-XR) 500 MG 24 hr tablet   Hyperlipidemia associated with type 2 diabetes mellitus ([place])    Controlled cholesterol on statin and diet/lifestyle but limited exercise, however had borderline elevated TG >150. LDL was 90 but goal is < 70 per Cards Last lipid panel 11/2015 ASCVD risk score is elevated due to known DM,  CAD  Plan: 1. Continue current meds - Rosuvastatin 20mg  daily - follow per Cards to start this med, there was problem with rx before 2. Continue ASA 81mg  for primary ASCVD risk reduction 3. Encourage improved lifestyle - low carb/cholesterol, reduce portion size, continue improving plan to start regular exercise 4. Follow-up 3 months with fasting labs and Annual Phys, lipids chemistry      Relevant Medications   metFORMIN (GLUCOPHAGE-XR) 500 MG 24 hr tablet   Controlled type 2 diabetes mellitus with diabetic nephropathy ([place]) - Primary    Well-controlled DM with A1c 7.0 (stable from prior 6.9, overall remains improved) Complications - CKD-III. No symptoms of hypoglycemia  Plan:  1. Discussion on DM management especially with wt and insulin use - agree to work on trial of new therapy - discussed GLP1, SGLT2 options for improved wt loss and lowering insulin due to continued titration down on Lantus and some concerns of potential risk of hypoglycemia. 2. Patient to contact insurance to check cost/coverage for GLP1 options Bydureon BCise, Trulicity, Victoza - notify office with preferred option - will send rx for 1 month supply with refill to try, consider 90 day in future, reviewed meds side effect benefits 3. Detailed plan for insulin reviewed - for now continue Lantus 30u nightly - decrease dose as advised if start GLP1 to 20u for week 1 then to 15u for week 2, and continue adjust pending results, goal to come off of lantus insulin possibly 4. Switch Metformin from 1000mg  daily (was not taking BID), to XR 500mg  x 2 tabs daily in AM for extended benefit, if cost issue will switch back to regular metformin 5. Continue Januvia 100mg  daily for now - may adjust if needed 6. Encourage improved lifestyle - low carb, low sugar diet, reduce portion size continue keto diet and other plans as reviewed, emphasized need to start regular exercise 7. Check CBG, bring log to next visit for review 8. Continue  ASA, ARB, Statin 9. Due DM Foot exam next visit / Advised to schedule DM ophtho exam, send record 10. Follow-up 3 months for labs and annual phys will review DM meds at that time      Relevant Medications   ACCU-CHEK AVIVA PLUS test strip   metFORMIN (GLUCOPHAGE-XR) 500 MG 24 hr tablet   Other Relevant Orders   POCT HgB A1C (Completed)   Chronic kidney disease (CKD) stage G3a/A1, moderately decreased glomerular filtration rate (GFR) between 45-59 mL/min/1.73 square meter and albuminuria creatinine ratio less than 30 mg/g    Stable CKD from previous results Follow Cr trend, check labs in 3 months for physical Limit NSAIDs, improve hydration         Meds ordered this encounter  Medications  . ACCU-CHEK AVIVA PLUS test strip    Sig: U UTD BID    Refill:  12  . metFORMIN (GLUCOPHAGE-XR) 500 MG 24 hr tablet    Sig: Take 2 tablets (1,000 mg total)  by mouth daily with breakfast.    Dispense:  180 tablet    Refill:  3    Follow up plan: Return in about 3 months (around 12/23/2016) for Medicare Physical CPE.  Nobie Putnam, [facility] 09/22/2016, 1:37 PM

## 2016-09-22 NOTE — Patient Instructions (Addendum)
Thank you for coming to the clinic today.  1. Keep up the great work with lifestyle, continue to gradually try to increase some exercise  A1c 7.0, unchanged from 6.9 last time  Call insurance and check with pharmacy to find out cost and coverage for the following  1. Bydureon BCise (Exenatide ER) - once weekly - this is my preference, very good medicine well tolerated, less side effects of nausea, upset stomach. No dose changes. Cost and coverage is the problem, but we may be able to get it with the coupon card  2. Trulicity (Dulaglutide) - once weekly - this is very good one, usually one of my top choices as well, two doses, 0.75 (likely we would start) and 1.5 max dose. We can use coupon card here too  3. Victoza (Liraglutide) - once DAILY - 3 dose changes 0.6, 1.2 and 1.8, side effects nausea, upset stomach higher on this one but it is still very effective medicine  4. Farxiga (Dapagliflozin) - pill, discussed lower sugar by removing in urine, still very effective  Let me know by message or phone call if you find one that is covered, will send to local [place] as requested for 30 day supply  IF we can get one covered, and started, then first week START new injectable medicine, and reduce Lantus from 30 to 20 units nightly. Then on week 2, reduce lantus from 20 to 15 units.  If Fasting AM sugar 80 to 150, continue with plan. If fasting sugar persistently >150 then may need to increase back up by 1 unit on Lantus every 3 days as needed. If fasting sugar < 80 or hypoglycemia symptoms, would continue to decrease lantus 2 units every 2-3 days if persistent.  Changed Metformin from 1000mg  daily in AM to Metformin XR 500mg  x 2 pills daily in AM, let me know if cost is significantly higher.  In future likely can either completely come off insulin or januvia and make other changes.  You will be due for FASTING BLOOD WORK (no food or drink after midnight before, only water or coffee  without cream/sugar on the morning of)  - Please go ahead and schedule a "Lab Only" visit in the morning at the clinic for lab draw in 3 months  Before next Annual Physical - Make sure Lab Only appointment is at least 1-2 weeks before your next appointment, so that results will be available  For Lab Results, once available within 2-3 days of blood draw, you can can log in to MyChart online to view your results and a brief explanation. Also, we can discuss results at next follow-up visit.   Please schedule a Follow-up Appointment to: Return in about 3 months (around 12/23/2016) for Medicare Physical CPE.  If you have any other questions or concerns, please feel free to call the clinic or send a message through [place]. You may also schedule an earlier appointment if necessary.  Additionally, you may be receiving a survey about your experience at our clinic within a few days to 1 week by e-mail or mail. We value your feedback.  Nobie Putnam, DO [place]

## 2016-09-26 ENCOUNTER — Encounter: Payer: Self-pay | Admitting: Family Medicine

## 2016-09-26 DIAGNOSIS — Z794 Long term (current) use of insulin: Principal | ICD-10-CM

## 2016-09-26 DIAGNOSIS — E1121 Type 2 diabetes mellitus with diabetic nephropathy: Secondary | ICD-10-CM

## 2016-09-27 MED ORDER — EXENATIDE ER 2 MG/0.85ML ~~LOC~~ AUIJ: 2.0000 mg | AUTO-INJECTOR | SUBCUTANEOUS | 3 refills | Status: DC

## 2016-09-30 ENCOUNTER — Encounter: Payer: Self-pay | Admitting: *Deleted

## 2016-10-07 ENCOUNTER — Ambulatory Visit: Payer: Medicare Other | Admitting: Anesthesiology

## 2016-10-07 ENCOUNTER — Ambulatory Visit
Admission: RE | Admit: 2016-10-07 | Discharge: 2016-10-07 | Disposition: A | Discharge status: Home / Self Care | Payer: Medicare Other | Source: Ambulatory Visit | Attending: Ophthalmology | Admitting: Ophthalmology

## 2016-10-07 ENCOUNTER — Encounter: Payer: Self-pay | Admitting: *Deleted

## 2016-10-07 ENCOUNTER — Encounter
Admission: RE | Disposition: A | Discharge status: Home / Self Care | Payer: Self-pay | Source: Ambulatory Visit | Attending: Ophthalmology

## 2016-10-07 DIAGNOSIS — H2511 Age-related nuclear cataract, right eye: Secondary | ICD-10-CM | POA: Diagnosis not present

## 2016-10-07 DIAGNOSIS — I2510 Atherosclerotic heart disease of native coronary artery without angina pectoris: Secondary | ICD-10-CM | POA: Insufficient documentation

## 2016-10-07 DIAGNOSIS — Z6841 Body Mass Index (BMI) 40.0 and over, adult: Secondary | ICD-10-CM | POA: Insufficient documentation

## 2016-10-07 DIAGNOSIS — G4730 Sleep apnea, unspecified: Secondary | ICD-10-CM | POA: Diagnosis not present

## 2016-10-07 DIAGNOSIS — I10 Essential (primary) hypertension: Secondary | ICD-10-CM | POA: Diagnosis not present

## 2016-10-07 HISTORY — PX: CATARACT EXTRACTION W/PHACO: SHX586

## 2016-10-07 HISTORY — DX: Acute myocardial infarction, unspecified: I21.9

## 2016-10-07 HISTORY — DX: Depression, unspecified: F32.A

## 2016-10-07 LAB — GLUCOSE, CAPILLARY: GLUCOSE-CAPILLARY: 96 mg/dL (ref 65–99)

## 2016-10-07 SURGERY — PHACOEMULSIFICATION, CATARACT, WITH IOL INSERTION
Anesthesia: Monitor Anesthesia Care | Site: Eye | Laterality: Right | Wound class: Clean

## 2016-10-07 MED ORDER — POVIDONE-IODINE 5 % OP SOLN
OPHTHALMIC | Status: DC | PRN
  Administered 2016-10-07: 1 via OPHTHALMIC

## 2016-10-07 MED ORDER — ARMC OPHTHALMIC DILATING DROPS
OPHTHALMIC | Status: AC
  Administered 2016-10-07: 1 via OPHTHALMIC
  Filled 2016-10-07: qty 0.4

## 2016-10-07 MED ORDER — POVIDONE-IODINE 5 % OP SOLN
OPHTHALMIC | Status: AC
  Filled 2016-10-07: qty 30

## 2016-10-07 MED ORDER — ALFENTANIL 500 MCG/ML IJ INJ
INJECTION | INTRAVENOUS | Status: DC | PRN
Start: 2016-10-07 — End: 2016-10-07
  Administered 2016-10-07: 250 ug via INTRAVENOUS

## 2016-10-07 MED ORDER — EPINEPHRINE PF 1 MG/ML IJ SOLN
INTRAMUSCULAR | Status: AC
  Filled 2016-10-07: qty 1

## 2016-10-07 MED ORDER — PROPOFOL 10 MG/ML IV BOLUS
INTRAVENOUS | Status: AC
  Filled 2016-10-07: qty 20

## 2016-10-07 MED ORDER — MOXIFLOXACIN HCL 0.5 % OP SOLN
1.0000 [drp] | OPHTHALMIC | Status: DC
  Administered 2016-10-07 (×3): 1 [drp] via OPHTHALMIC

## 2016-10-07 MED ORDER — LIDOCAINE HCL (PF) 4 % IJ SOLN
INTRAOCULAR | Status: DC | PRN
  Administered 2016-10-07: 4 mL via OPHTHALMIC

## 2016-10-07 MED ORDER — GLYCOPYRROLATE 0.2 MG/ML IJ SOLN
INTRAMUSCULAR | Status: AC
  Filled 2016-10-07: qty 1

## 2016-10-07 MED ORDER — NA HYALUR & NA CHOND-NA HYALUR 0.4-0.35 ML IO KIT
PACK | INTRAOCULAR | Status: DC | PRN
  Administered 2016-10-07: .35 mL via INTRAOCULAR

## 2016-10-07 MED ORDER — CARBACHOL 0.01 % IO SOLN
INTRAOCULAR | Status: DC | PRN
  Administered 2016-10-07: 0.5 mL via INTRAOCULAR

## 2016-10-07 MED ORDER — MIDAZOLAM HCL 2 MG/2ML IJ SOLN
INTRAMUSCULAR | Status: DC | PRN
  Administered 2016-10-07 (×2): 1 mg via INTRAVENOUS

## 2016-10-07 MED ORDER — SODIUM CHLORIDE 0.9 % IV SOLN
INTRAVENOUS | Status: DC
  Administered 2016-10-07 (×2): via INTRAVENOUS

## 2016-10-07 MED ORDER — LIDOCAINE HCL (PF) 2 % IJ SOLN
INTRAMUSCULAR | Status: AC
  Filled 2016-10-07: qty 2

## 2016-10-07 MED ORDER — BSS IO SOLN
INTRAOCULAR | Status: DC | PRN
  Administered 2016-10-07: 200 mL via OPHTHALMIC

## 2016-10-07 MED ORDER — MIDAZOLAM HCL 2 MG/2ML IJ SOLN
INTRAMUSCULAR | Status: AC
  Filled 2016-10-07: qty 2

## 2016-10-07 MED ORDER — ERYTHROMYCIN 5 MG/GM OP OINT
TOPICAL_OINTMENT | OPHTHALMIC | Status: AC
  Filled 2016-10-07: qty 1

## 2016-10-07 MED ORDER — MOXIFLOXACIN HCL 0.5 % OP SOLN
OPHTHALMIC | Status: AC
  Filled 2016-10-07: qty 3

## 2016-10-07 MED ORDER — LIDOCAINE HCL (PF) 4 % IJ SOLN
INTRAMUSCULAR | Status: AC
  Filled 2016-10-07: qty 5

## 2016-10-07 MED ORDER — NA CHONDROIT SULF-NA HYALURON 40-17 MG/ML IO SOLN
INTRAOCULAR | Status: AC
  Filled 2016-10-07: qty 1

## 2016-10-07 MED ORDER — ARMC OPHTHALMIC DILATING DROPS
1.0000 "application " | OPHTHALMIC | Status: AC
  Administered 2016-10-07 (×3): 1 via OPHTHALMIC

## 2016-10-07 SURGICAL SUPPLY — 16 items
GLOVE BIO SURGEON STRL SZ8 (GLOVE) ×2 IMPLANT
GLOVE BIOGEL M 6.5 STRL (GLOVE) ×2 IMPLANT
GLOVE SURG LX 7.5 STRW (GLOVE) ×1
GLOVE SURG LX STRL 7.5 STRW (GLOVE) ×1 IMPLANT
GOWN STRL REUS W/ TWL LRG LVL3 (GOWN DISPOSABLE) ×2 IMPLANT
GOWN STRL REUS W/TWL LRG LVL3 (GOWN DISPOSABLE) ×2
LABEL CATARACT MEDS ST (LABEL) ×2 IMPLANT
LENS IOL TECNIS ITEC 8.0 (Intraocular Lens) ×2 IMPLANT
PACK CATARACT (MISCELLANEOUS) ×2 IMPLANT
PACK CATARACT BRASINGTON LX (MISCELLANEOUS) ×2 IMPLANT
PACK EYE AFTER SURG (MISCELLANEOUS) ×2 IMPLANT
SOL BSS BAG (MISCELLANEOUS) ×2
SOLUTION BSS BAG (MISCELLANEOUS) ×1 IMPLANT
SYR 5ML LL (SYRINGE) ×2 IMPLANT
WATER STERILE IRR 250ML POUR (IV SOLUTION) ×2 IMPLANT
WIPE NON LINTING 3.25X3.25 (MISCELLANEOUS) ×2 IMPLANT

## 2016-10-07 NOTE — Anesthesia Preprocedure Evaluation (Signed)
Anesthesia Evaluation  Patient identified by MRN, date of birth, ID band Patient awake    Reviewed: Allergy & Precautions, H&P , NPO status , Patient's Chart, lab work & pertinent test results  Airway Mallampati: III  TM Distance: >3 FB Neck ROM: full    Dental no notable dental hx.    Pulmonary sleep apnea ,    Pulmonary exam normal        Cardiovascular hypertension, + CAD  Normal cardiovascular exam     Neuro/Psych    GI/Hepatic   Endo/Other  diabetesHypothyroidism Morbid obesity  Renal/GU      Musculoskeletal   Abdominal   Peds  Hematology   Anesthesia Other Findings   Reproductive/Obstetrics                             Anesthesia Physical Anesthesia Plan  ASA: III  Anesthesia Plan: MAC   Post-op Pain Management:    Induction:   PONV Risk Score and Plan:   Airway Management Planned:   Additional Equipment:   Intra-op Plan:   Post-operative Plan:   Informed Consent: I have reviewed the patients History and Physical, chart, labs and discussed the procedure including the risks, benefits and alternatives for the proposed anesthesia with the patient or authorized representative who has indicated his/her understanding and acceptance.     Plan Discussed with:   Anesthesia Plan Comments:         Anesthesia Quick Evaluation  

## 2016-10-07 NOTE — Anesthesia Postprocedure Evaluation (Signed)
Anesthesia Post Note  Patient: [name]  Procedure(s) Performed: Procedure(s) (LRB): CATARACT EXTRACTION PHACO AND INTRAOCULAR LENS PLACEMENT (IOC) (Right)  Patient location during evaluation: PACU Anesthesia Type: MAC Level of consciousness: awake and alert Pain management: pain level controlled Vital Signs Assessment: post-procedure vital signs reviewed and stable Respiratory status: spontaneous breathing, nonlabored ventilation, respiratory function stable and patient connected to nasal cannula oxygen Cardiovascular status: stable and blood pressure returned to baseline Anesthetic complications: no     Last Vitals:  Vitals:   10/07/16 0808 10/07/16 0810  BP: (!) 134/48 103/79  Pulse: (!) 52   Resp: 16   Temp: 36.8 C   SpO2: 99% 99%    Last Pain:  Vitals:   10/07/16 0612  TempSrc: Oral                 Precious Haws [place]

## 2016-10-07 NOTE — Anesthesia Post-op Follow-up Note (Signed)
Anesthesia QCDR form completed.        

## 2016-10-07 NOTE — H&P (Signed)
The History and Physical notes are on paper, have been signed, and are to be scanned. The patient remains stable and unchanged from the H&P.   Previous H&P reviewed, patient examined, and there are no changes.  [name] 10/07/2016 7:32 AM

## 2016-10-07 NOTE — Transfer of Care (Signed)
Immediate Anesthesia Transfer of Care Note  Patient: [name]  Procedure(s) Performed: Procedure(s) with comments: CATARACT EXTRACTION PHACO AND INTRAOCULAR LENS PLACEMENT (IOC) (Right) - Lot# [id number] H [place]: 00:43.3 AP%: 12.2 CDE: 5.28  Patient Location: PACU and Short Stay  Anesthesia Type:MAC  Level of Consciousness: awake, oriented and patient cooperative  Airway & Oxygen Therapy: Patient Spontanous Breathing  Post-op Assessment: Report given to RN and Post -op Vital signs reviewed and stable  Post vital signs: Reviewed and stable  Last Vitals:  Vitals:   10/07/16 0612  BP: (!) 125/54  Pulse: 61  Resp: 16  Temp: 37 C    Last Pain:  Vitals:   10/07/16 0612  TempSrc: Oral         Complications: No apparent anesthesia complications

## 2016-10-07 NOTE — Discharge Instructions (Signed)
Eye Surgery Discharge Instructions ° °Expect mild scratchy sensation or mild soreness. °DO NOT RUB YOUR EYE! ° °The day of surgery: °• Minimal physical activity, but bed rest is not required °• No reading, computer work, or close hand work °• No bending, lifting, or straining. °• May watch TV ° °For 24 hours: °• No driving, legal decisions, or alcoholic beverages °• Safety precautions °• Eat anything you prefer: It is better to start with liquids, then soup then solid foods. °• _____ Eye patch should be worn until postoperative exam tomorrow. °• ____ Solar shield eyeglasses should be worn for comfort in the sunlight/patch while sleeping ° °Resume all regular medications including aspirin or Coumadin if these were discontinued prior to surgery. °You may shower, bathe, shave, or wash your hair. °Tylenol may be taken for mild discomfort. ° °Call your doctor if you experience significant pain, nausea, or vomiting, fever > 101 or other signs of infection. [phone] or [phone] °Specific instructions: ° ° Eye Surgery Discharge Instructions ° °Expect mild scratchy sensation or mild soreness. °DO NOT RUB YOUR EYE! ° °The day of surgery: °• Minimal physical activity, but bed rest is not required °• No reading, computer work, or close hand work °• No bending, lifting, or straining. °• May watch TV ° °For 24 hours: °• No driving, legal decisions, or alcoholic beverages °• Safety precautions °• Eat anything you prefer: It is better to start with liquids, then soup then solid foods. °• _____ Eye patch should be worn until postoperative exam tomorrow. °• ____ Solar shield eyeglasses should be worn for comfort in the sunlight/patch while sleeping ° °Resume all regular medications including aspirin or Coumadin if these were discontinued prior to surgery. °You may shower, bathe, shave, or wash your hair. °Tylenol may be taken for mild discomfort. ° °Call your doctor if you experience significant pain, nausea, or vomiting, fever  > 101 or other signs of infection. [phone] or [phone] °Specific instructions: ° ° °

## 2016-10-07 NOTE — Op Note (Signed)
OPERATIVE NOTE  [name] [id number] 10/07/2016   PREOPERATIVE DIAGNOSIS:  Nuclear Sclerotic Cataract Right Eye H25.11   POSTOPERATIVE DIAGNOSIS: Nuclear Sclerotic Cataract Right Eye H25.11          PROCEDURE:  Phacoemusification with posterior chamber intraocular lens placement of the right eye   LENS:   Implant Name Type Inv. Item Serial No. Manufacturer Lot No. LRB No. Used  LENS IOL DIOP 08.0 - [id number] Intraocular Lens LENS IOL DIOP 08.0 [id number]   Right 1       ULTRASOUND TIME: 12 %  of 01 minutes 43 seconds, CDE 5.3  SURGEON:  Wyonia Hough, MD   ANESTHESIA:  Topical with tetracaine drops and 2% Xylocaine jelly, augmented with 1% preservative-free intracameral lidocaine.    COMPLICATIONS:  None.   DESCRIPTION OF PROCEDURE:  The patient was identified in the holding room and transported to the operating room and placed in the supine position under the operating microscope. Theright eye was identified as the operative eye and it was prepped and draped in the usual sterile ophthalmic fashion.   A 1 millimeter clear-corneal paracentesis was made at the 12:00 position.  0.5 ml of preservative-free 1% lidocaine was injected into the anterior chamber. The anterior chamber was filled with Viscoat viscoelastic.  A 2.4 millimeter keratome was used to make a near-clear corneal incision at the 9:00 position. A curvilinear capsulorrhexis was made with a cystotome and capsulorrhexis forceps.  Balanced salt solution was used to hydrodissect and hydrodelineate the nucleus.   Phacoemulsification was then used in stop and chop fashion to remove the lens nucleus and epinucleus.  The remaining cortex was then removed using the irrigation and aspiration handpiece. Provisc was then placed into the capsular bag to distend it for lens placement.  A lens was then injected into the capsular bag.  The remaining viscoelastic was aspirated.  Wounds were hydrated with balanced  salt solution.  The anterior chamber was inflated to a physiologic pressure with balanced salt solution. Vigamox 0.2 ml of a 1mg  per ml solution was injected into the anterior chamber for a dose of 0.2 mg of intracameral antibiotic at the completion of the case. Miostat was placed into the anterior chamber to constrict the pupil.  No wound leaks were noted.  Topical erythromycin ointment was applied to the eye.  The patient was taken to the recovery room in stable condition without complications of anesthesia or surgery.  [name] 10/07/2016, 8:01 AM

## 2016-10-14 ENCOUNTER — Ambulatory Visit
Admission: RE | Admit: 2016-10-14 | Discharge: 2016-10-14 | Disposition: A | Discharge status: Home / Self Care | Payer: Medicare Other | Source: Ambulatory Visit | Attending: Family Medicine | Admitting: Family Medicine

## 2016-10-14 DIAGNOSIS — Z1231 Encounter for screening mammogram for malignant neoplasm of breast: Secondary | ICD-10-CM | POA: Diagnosis not present

## 2016-11-09 ENCOUNTER — Other Ambulatory Visit: Payer: Self-pay

## 2016-11-09 DIAGNOSIS — E890 Postprocedural hypothyroidism: Secondary | ICD-10-CM

## 2016-11-09 MED ORDER — ROSUVASTATIN CALCIUM 20 MG PO TABS: 20.0000 mg | ORAL_TABLET | Freq: Every day | ORAL | 3 refills | Status: DC

## 2016-11-09 MED ORDER — LEVOTHYROXINE SODIUM 150 MCG PO TABS: 150.0000 ug | ORAL_TABLET | Freq: Every day | ORAL | 3 refills | Status: DC

## 2016-11-09 MED ORDER — LOSARTAN POTASSIUM 100 MG PO TABS: 100.0000 mg | ORAL_TABLET | Freq: Every day | ORAL | 3 refills | Status: DC

## 2016-11-10 ENCOUNTER — Other Ambulatory Visit: Payer: Self-pay

## 2016-11-10 DIAGNOSIS — Z794 Long term (current) use of insulin: Principal | ICD-10-CM

## 2016-11-10 DIAGNOSIS — E1121 Type 2 diabetes mellitus with diabetic nephropathy: Secondary | ICD-10-CM

## 2016-11-10 MED ORDER — ACCU-CHEK AVIVA PLUS VI STRP: ORAL_STRIP | 12 refills | Status: DC

## 2016-11-16 ENCOUNTER — Telehealth: Payer: Self-pay | Admitting: Family Medicine

## 2016-11-16 NOTE — Telephone Encounter (Signed)
Pt returned call she is scheduled for 10/2 w/Nurse Tiffany - knb

## 2016-11-16 NOTE — Telephone Encounter (Signed)
Called pt to schedule for Annual Wellness Visit with Nurse Health Advisor, Tiffany Hill, my c/b # is [phone]  Kathryn Brown ° °

## 2016-11-30 ENCOUNTER — Ambulatory Visit (INDEPENDENT_AMBULATORY_CARE_PROVIDER_SITE_OTHER): Payer: Medicare Other

## 2016-11-30 VITALS — BP 132/60 | HR 76 | Temp 98.8°F | Resp 16 | Ht 66.0 in | Wt 253.6 lb

## 2016-11-30 DIAGNOSIS — Z0000 Encounter for general adult medical examination without abnormal findings: Secondary | ICD-10-CM

## 2016-11-30 DIAGNOSIS — Z23 Encounter for immunization: Secondary | ICD-10-CM

## 2016-11-30 NOTE — Progress Notes (Signed)
Subjective:   [name] is a 71 y.o. female who presents for an Initial Medicare Annual Wellness Visit.  Review of Systems: N/A   Cardiac Risk Factors include: advanced age (>54men, >82 women);diabetes mellitus;dyslipidemia;hypertension;obesity (BMI >30kg/m2);sedentary lifestyle     Objective:    Today's Vitals   11/30/16 1327  BP: 132/60  Pulse: 76  Resp: 16  Temp: 98.8 F (37.1 C)  Weight: 253 lb 9.6 oz (115 kg)  Height: 5\' 6"  (1.676 m)   Body mass index is 40.93 kg/m.   Current Medications (verified) Outpatient Encounter Prescriptions as of 11/30/2016  Medication Sig  . ACCU-CHEK AVIVA PLUS test strip U UTD BID  . acetaminophen (TYLENOL) 500 MG tablet Take 2 tablets by mouth 3 (three) times daily as needed.  Marland Kitchen amLODipine (NORVASC) 2.5 MG tablet Take 1 tablet (2.5 mg total) by mouth daily.  Marland Kitchen aspirin 81 MG tablet Take 81 mg by mouth daily.  . carvedilol (COREG) 3.125 MG tablet Take 1 tablet (3.125 mg total) by mouth 2 (two) times daily. (Patient taking differently: Take 3.125 mg by mouth daily. )  . Exenatide ER (BYDUREON BCISE) 2 MG/0.85ML AUIJ Inject 2 mg into the skin once a week.  Marland Kitchen FLUoxetine (PROZAC) 10 MG capsule TAKE 1 TABLET BY MOUTH EVERY DAY. NEED TO MAKE APPOINTMENT WITH DOCTOR FOR MORE REFILLS  . levothyroxine (SYNTHROID, LEVOTHROID) 150 MCG tablet Take 1 tablet (150 mcg total) by mouth daily.  Marland Kitchen losartan (COZAAR) 100 MG tablet Take 1 tablet (100 mg total) by mouth daily.  . metFORMIN (GLUCOPHAGE-XR) 500 MG 24 hr tablet Take 2 tablets (1,000 mg total) by mouth daily with breakfast.  . nitroGLYCERIN (NITROSTAT) 0.4 MG SL tablet Place 1 tablet (0.4 mg total) under the tongue every 5 (five) minutes as needed for chest pain.  . rosuvastatin (CRESTOR) 20 MG tablet Take 1 tablet (20 mg total) by mouth daily.  . sitaGLIPtin (JANUVIA) 100 MG tablet Take 1 tablet (100 mg total) by mouth daily.  . traMADol (ULTRAM) 50 MG tablet Take 1 tablet (50 mg total) by  mouth every 8 (eight) hours as needed.  . Multiple Vitamin (MULTIVITAMIN) tablet Take 1 tablet by mouth 2 (two) times daily.  . Omega-3 Fatty Acids (OMEGA-3 PLUS PO) Take by mouth 2 (two) times daily.  Marland Kitchen POTASSIUM PO Take by mouth daily.  . [DISCONTINUED] CALCIUM-MAGNESIUM-ZINC PO Take by mouth 2 (two) times daily.  . [DISCONTINUED] Insulin Glargine (LANTUS SOLOSTAR) 100 UNIT/ML Solostar Pen Inject 42 Units into the skin daily at 10 pm. (Patient taking differently: Inject 30 Units into the skin daily at 10 pm. )  . [DISCONTINUED] Insulin Pen Needle (B-D ULTRAFINE III SHORT PEN) 31G X 8 MM MISC USE AS DIRECTED   No facility-administered encounter medications on file as of 11/30/2016.     Allergies (verified) Patient has no known allergies.   History: Past Medical History:  Diagnosis Date  . Arthritis    knees   . Coronary artery disease    a. 07/2013 STEMI/PCI ([place]): LM nl, LAD 100p (thrombectomy & 3.5x23 Alpine DES), LCX nl, RCA nl, EF 45%.  . Depression   . Diabetes mellitus, type II ([place])   . Endometrial cancer ([place])    THYROID CANCER  . Gravida 3 para 3   . Hyperlipidemia   . Hypertension   . Hypertensive heart disease   . Hypothyroid   . Ischemic cardiomyopathy    a. 07/2013 EF 45% @ time of STEMI; b. 11/2013 Echo:  EF 55-60%, mildly dil LA, nl RV fxn.  . Menopause   . Myocardial infarction ([place])    2016  . Obstructive sleep apnea on CPAP   . Osteoarthritis    Past Surgical History:  Procedure Laterality Date  . ABDOMINAL HYSTERECTOMY    . CARDIAC CATHETERIZATION    . CATARACT EXTRACTION  08/30/2016  . CATARACT EXTRACTION W/PHACO Left 08/30/2016   Procedure: CATARACT EXTRACTION PHACO AND INTRAOCULAR LENS PLACEMENT ([place]) Left daibetic;  Surgeon: Leandrew Koyanagi, MD;  Location: [facility];  Service: Ophthalmology;  Laterality: Left;  Diabetic - insulin and oral meds sleep apnea  . CATARACT EXTRACTION W/PHACO Right 10/07/2016   Procedure: CATARACT EXTRACTION PHACO  AND INTRAOCULAR LENS PLACEMENT ([facility]);  Surgeon: Leandrew Koyanagi, MD;  Location: [facility];  Service: Ophthalmology;  Laterality: Right;  Lot# [id number] H [place]: 00:43.3 AP%: 12.2 CDE: 5.28  . CORONARY ANGIOPLASTY     drug eluting stent placed at [place]    . LAPAROSCOPIC HYSTERECTOMY    . THYROIDECTOMY     Family History  Problem Relation Age of Onset  . Heart attack Father   . Heart failure Father   . Hypertension Father   . Hyperlipidemia Father   . Heart disease Father 11       CABG   . Pulmonary fibrosis Son   . Breast cancer Neg Hx    Social History   Occupational History  . Not on file.   Social History Main Topics  . Smoking status: Never Smoker  . Smokeless tobacco: Never Used  . Alcohol use No  . Drug use: No  . Sexual activity: Not on file    Tobacco Counseling Counseling given: Not Answered   Activities of Daily Living In your present state of health, do you have any difficulty performing the following activities: 11/30/2016 08/30/2016  Hearing? N N  Vision? Y N  Comment wears eyeglasses -  Difficulty concentrating or making decisions? N N  Walking or climbing stairs? Y N  Comment pain in knee -  Dressing or bathing? N N  Doing errands, shopping? N -  Preparing Food and eating ? N -  Using the Toilet? N -  In the past six months, have you accidently leaked urine? N -  Do you have problems with loss of bowel control? N -  Managing your Medications? N -  Managing your Finances? N -  Housekeeping or managing your Housekeeping? N -  Some recent data might be hidden    Immunizations and Health Maintenance Immunization History  Administered Date(s) Administered  . Influenza Split 12/29/2011  . Influenza, High Dose Seasonal PF 11/19/2014, 12/11/2015  . Influenza,inj,Quad PF,6+ Mos 12/20/2012  . PPD Test 01/19/2016  . Pneumococcal Conjugate-13 01/18/2014  . Pneumococcal Polysaccharide-23 07/30/2004, 12/20/2012  .  Tdap 08/02/2013   There are no preventive care reminders to display for this patient.  Patient Care Team: Olin Hauser, DO as PCP - General (Family Medicine) Leandrew Koyanagi, MD as Referring Physician (Ophthalmology) Minna Merritts, MD as Consulting Physician (Cardiology)  Indicate any recent Medical Services you may have received from other than [facility] providers in the past year (date may be approximate).     Assessment:   This is a routine wellness examination for [place].   Hearing/Vision screen Vision Screening Comments: Sees Dr. Wallace Going for annual eye exams.  Dietary issues and exercise activities discussed: Current Exercise Habits: The patient does not participate in regular exercise at  present, Exercise limited by: orthopedic condition(s) (pain in knees)  Goals    . Reduce sugar intake to X grams per day          Recommend eliminating carbs and sweets from diet      Depression Screen PHQ 2/9 Scores 11/30/2016 11/30/2016 05/06/2016 03/25/2016 12/11/2015 08/08/2014  PHQ - 2 Score 0 0 0 0 0 0    Fall Risk Fall Risk  11/30/2016 11/30/2016 05/06/2016 03/25/2016 12/11/2015  Falls in the past year? No No No No No  Risk for fall due to : Impaired vision;Impaired balance/gait - - - -  Risk for fall due to: Comment wears eyeglasses; pain in knees - - - -    Cognitive Function:     6CIT Screen 11/30/2016  What Year? 0 points  What month? 0 points  What time? 0 points  Count back from 20 0 points  Months in reverse 0 points  Repeat phrase 0 points  Total Score 0    Screening Tests Health Maintenance  Topic Date Due  . Hepatitis C Screening  03/25/2017 (Originally 11/04/1945)  . COLONOSCOPY  05/06/2017 (Originally [birth year]09/1995)  . FOOT EXAM  12/10/2016  . OPHTHALMOLOGY EXAM  01/07/2017  . HEMOGLOBIN A1C  03/25/2017  . MAMMOGRAM  10/15/2018  . TETANUS/TDAP  08/03/2023  . INFLUENZA VACCINE  Completed  . DEXA SCAN  Completed  . PNA vac Low Risk Adult  Completed       Plan:   I have personally reviewed and addressed the Medicare Annual Wellness questionnaire and have noted the following in the patient's chart:  A. Medical and social history B. Use of alcohol, tobacco or illicit drugs  C. Current medications and supplements D. Functional ability and status E.  Nutritional status F.  Physical activity G. Advance directives H. List of other physicians I.  Hospitalizations, surgeries, and ER visits in previous 12 months J.  [place] such as hearing and vision if needed, cognitive and depression L. Referrals and appointments - none  In addition, I have reviewed and discussed with patient certain preventive protocols, quality metrics, and best practice recommendations. A written personalized care plan for preventive services as well as general preventive health recommendations were provided to patient.  See attached scanned questionnaire for additional information.   Signed,  Aleatha Borer, LPN Nurse Health Advisor   MD Recommendations: None

## 2016-11-30 NOTE — Patient Instructions (Addendum)
[name] , Thank you for taking time to come for your Medicare Wellness Visit. I appreciate your ongoing commitment to your health goals. Please review the following plan we discussed and let me know if I can assist you in the future.   Screening recommendations/referrals: Colonoscopy: Needs completed. Pt states she will discuss further with Dr. Parks Ranger. Mammogram: completed 10/15/16 Bone Density: completed 01/01/13 Recommended yearly ophthalmology/optometry visit for glaucoma screening and checkup Recommended yearly dental visit for hygiene and checkup  Vaccinations: Influenza vaccine: given today Pneumococcal vaccine: completed series Tdap vaccine: up to date Shingles vaccine: Pt states she will check with insurance.    Advanced directives: Advance directive discussed with you today. I have provided a copy for you to complete at home and have notarized. Once this is complete please bring a copy in to our office so we can scan it into your chart.  Conditions/risks identified: Recommend eliminating carbs and sweets from diet; Fall risk prevention discussed  Next appointment: Scheduled to complete labs on 12/20/16 @ 9:00am. Scheduled to see Dr. Parks Ranger on 12/24/16 @ 2:00pm  Influenza (Flu) Vaccine (Inactivated or Recombinant): What You Need to Know 1. Why get vaccinated? Influenza ("flu") is a contagious disease that spreads around the [place] every year, usually between October and May. Flu is caused by influenza viruses, and is spread mainly by coughing, sneezing, and close contact. Anyone can get flu. Flu strikes suddenly and can last several days. Symptoms vary by age, but can include:  fever/chills  sore throat  muscle aches  fatigue  cough  headache  runny or stuffy nose  Flu can also lead to pneumonia and blood infections, and cause diarrhea and seizures in children. If you have a medical condition, such as heart or lung disease, flu can make it  worse. Flu is more dangerous for some people. Infants and young children, people 31 years of age and older, pregnant women, and people with certain health conditions or a weakened immune system are at greatest risk. Each year thousands of people in the [place] die from flu, and many more are hospitalized. Flu vaccine can:  keep you from getting flu,  make flu less severe if you do get it, and  keep you from spreading flu to your family and other people. 2. Inactivated and recombinant flu vaccines A dose of flu vaccine is recommended every flu season. Children 6 months through 50 years of age may need two doses during the same flu season. Everyone else needs only one dose each flu season. Some inactivated flu vaccines contain a very small amount of a mercury-based preservative called thimerosal. Studies have not shown thimerosal in vaccines to be harmful, but flu vaccines that do not contain thimerosal are available. There is no live flu virus in flu shots. They cannot cause the flu. There are many flu viruses, and they are always changing. Each year a new flu vaccine is made to protect against three or four viruses that are likely to cause disease in the upcoming flu season. But even when the vaccine doesn't exactly match these viruses, it may still provide some protection. Flu vaccine cannot prevent:  flu that is caused by a virus not covered by the vaccine, or  illnesses that look like flu but are not.  It takes about 2 weeks for protection to develop after vaccination, and protection lasts through the flu season. 3. Some people should not get this vaccine Tell the person who is giving you the vaccine:  If you have any severe, life-threatening allergies. If you ever had a life-threatening allergic reaction after a dose of flu vaccine, or have a severe allergy to any part of this vaccine, you may be advised not to get vaccinated. Most, but not all, types of flu vaccine contain a small  amount of egg protein.  If you ever had Guillain-Barr Syndrome (also called GBS). Some people with a history of GBS should not get this vaccine. This should be discussed with your doctor.  If you are not feeling well. It is usually okay to get flu vaccine when you have a mild illness, but you might be asked to come back when you feel better.  4. Risks of a vaccine reaction With any medicine, including vaccines, there is a chance of reactions. These are usually mild and go away on their own, but serious reactions are also possible. Most people who get a flu shot do not have any problems with it. Minor problems following a flu shot include:  soreness, redness, or swelling where the shot was given  hoarseness  sore, red or itchy eyes  cough  fever  aches  headache  itching  fatigue  If these problems occur, they usually begin soon after the shot and last 1 or 2 days. More serious problems following a flu shot can include the following:  There may be a small increased risk of Guillain-Barre Syndrome (GBS) after inactivated flu vaccine. This risk has been estimated at 1 or 2 additional cases per million people vaccinated. This is much lower than the risk of severe complications from flu, which can be prevented by flu vaccine.  Young children who get the flu shot along with pneumococcal vaccine (PCV13) and/or DTaP vaccine at the same time might be slightly more likely to have a seizure caused by fever. Ask your doctor for more information. Tell your doctor if a child who is getting flu vaccine has ever had a seizure.  Problems that could happen after any injected vaccine:  People sometimes faint after a medical procedure, including vaccination. Sitting or lying down for about 15 minutes can help prevent fainting, and injuries caused by a fall. Tell your doctor if you feel dizzy, or have vision changes or ringing in the ears.  Some people get severe pain in the shoulder and have  difficulty moving the arm where a shot was given. This happens very rarely.  Any medication can cause a severe allergic reaction. Such reactions from a vaccine are very rare, estimated at about 1 in a million doses, and would happen within a few minutes to a few hours after the vaccination. As with any medicine, there is a very remote chance of a vaccine causing a serious injury or death. The safety of vaccines is always being monitored. For more information, visit: http://www.aguilar.org/ 5. What if there is a serious reaction? What should I look for? Look for anything that concerns you, such as signs of a severe allergic reaction, very high fever, or unusual behavior. Signs of a severe allergic reaction can include hives, swelling of the face and throat, difficulty breathing, a fast heartbeat, dizziness, and weakness. These would start a few minutes to a few hours after the vaccination. What should I do?  If you think it is a severe allergic reaction or other emergency that can't wait, call 9-1-1 and get the person to the nearest hospital. Otherwise, call your doctor.  Reactions should be reported to the Vaccine Adverse Event Reporting System (  VAERS). Your doctor should file this report, or you can do it yourself through the VAERS web site at www.vaers.SamedayNews.es, or by calling 743-307-3193. ? VAERS does not give medical advice. 6. The National Vaccine Injury Compensation Program The [employer] Vaccine Injury Compensation Program (VICP) is a federal program that was created to compensate people who may have been injured by certain vaccines. Persons who believe they may have been injured by a vaccine can learn about the program and about filing a claim by calling 567-017-8017 or visiting the [employer] website at GoldCloset.com.ee. There is a time limit to file a claim for compensation. 7. How can I learn more?  Ask your healthcare provider. He or she can give you the vaccine package  insert or suggest other sources of information.  Call your local or state health department.  Contact the Centers for Disease Control and Prevention (CDC): ? Call 930-849-8561 (1-800-CDC-INFO) or ? Visit CDC's website at https://gibson.com/ Vaccine Information Statement, Inactivated Influenza Vaccine (10/05/2013) This information is not intended to replace advice given to you by your health care provider. Make sure you discuss any questions you have with your health care provider. Document Released: 12/10/2005 Document Revised: 11/06/2015 Document Reviewed: 11/06/2015 Elsevier Interactive Patient Education  2017 [place] 65 Years and Older, Female Preventive care refers to lifestyle choices and visits with your health care provider that can promote health and wellness. What does preventive care include?  A yearly physical exam. This is also called an annual well check.  Dental exams once or twice a year.  Routine eye exams. Ask your health care provider how often you should have your eyes checked.  Personal lifestyle choices, including:  Daily care of your teeth and gums.  Regular physical activity.  Eating a healthy diet.  Avoiding tobacco and drug use.  Limiting alcohol use.  Practicing safe sex.  Taking low-dose aspirin every day.  Taking vitamin and mineral supplements as recommended by your health care provider. What happens during an annual well check? The services and screenings done by your health care provider during your annual well check will depend on your age, overall health, lifestyle risk factors, and family history of disease. Counseling  Your health care provider may ask you questions about your:  Alcohol use.  Tobacco use.  Drug use.  Emotional well-being.  Home and relationship well-being.  Sexual activity.  Eating habits.  History of falls.  Memory and ability to understand (cognition).  Work and work  Statistician.  Reproductive health. Screening  You may have the following tests or measurements:  Height, weight, and BMI.  Blood pressure.  Lipid and cholesterol levels. These may be checked every 5 years, or more frequently if you are over 52 years old.  Skin check.  Lung cancer screening. You may have this screening every year starting at age 68 if you have a 30-pack-year history of smoking and currently smoke or have quit within the past 15 years.  Fecal occult blood test (FOBT) of the stool. You may have this test every year starting at age 36.  Flexible sigmoidoscopy or colonoscopy. You may have a sigmoidoscopy every 5 years or a colonoscopy every 10 years starting at age 6.  Hepatitis C blood test.  Hepatitis B blood test.  Sexually transmitted disease (STD) testing.  Diabetes screening. This is done by checking your blood sugar (glucose) after you have not eaten for a while (fasting). You may have this done every 1-3 years.  Bone density scan. This is done to screen for osteoporosis. You may have this done starting at age 108.  Mammogram. This may be done every 1-2 years. Talk to your health care provider about how often you should have regular mammograms. Talk with your health care provider about your test results, treatment options, and if necessary, the need for more tests. Vaccines  Your health care provider may recommend certain vaccines, such as:  Influenza vaccine. This is recommended every year.  Tetanus, diphtheria, and acellular pertussis (Tdap, Td) vaccine. You may need a Td booster every 10 years.  Zoster vaccine. You may need this after age 26.  Pneumococcal 13-valent conjugate (PCV13) vaccine. One dose is recommended after age 66.  Pneumococcal polysaccharide (PPSV23) vaccine. One dose is recommended after age 31. Talk to your health care provider about which screenings and vaccines you need and how often you need them. This information is not  intended to replace advice given to you by your health care provider. Make sure you discuss any questions you have with your health care provider. Document Released: 03/14/2015 Document Revised: 11/05/2015 Document Reviewed: 12/17/2014 Elsevier Interactive Patient Education  2017 [place] Prevention in the Home Falls can cause injuries. They can happen to people of all ages. There are many things you can do to make your home safe and to help prevent falls. What can I do on the outside of my home?  Regularly fix the edges of walkways and driveways and fix any cracks.  Remove anything that might make you trip as you walk through a door, such as a raised step or threshold.  Trim any bushes or trees on the path to your home.  Use bright outdoor lighting.  Clear any walking paths of anything that might make someone trip, such as rocks or tools.  Regularly check to see if handrails are loose or broken. Make sure that both sides of any steps have handrails.  Any raised decks and porches should have guardrails on the edges.  Have any leaves, snow, or ice cleared regularly.  Use sand or salt on walking paths during winter.  Clean up any spills in your garage right away. This includes oil or grease spills. What can I do in the bathroom?  Use night lights.  Install grab bars by the toilet and in the tub and shower. Do not use towel bars as grab bars.  Use non-skid mats or decals in the tub or shower.  If you need to sit down in the shower, use a plastic, non-slip stool.  Keep the floor dry. Clean up any water that spills on the floor as soon as it happens.  Remove soap buildup in the tub or shower regularly.  Attach bath mats securely with double-sided non-slip rug tape.  Do not have throw rugs and other things on the floor that can make you trip. What can I do in the bedroom?  Use night lights.  Make sure that you have a light by your bed that is easy to reach.  Do  not use any sheets or blankets that are too big for your bed. They should not hang down onto the floor.  Have a firm chair that has side arms. You can use this for support while you get dressed.  Do not have throw rugs and other things on the floor that can make you trip. What can I do in the kitchen?  Clean up any spills right away.  Avoid walking  on wet floors.  Keep items that you use a lot in easy-to-reach places.  If you need to reach something above you, use a strong step stool that has a grab bar.  Keep electrical cords out of the way.  Do not use floor polish or wax that makes floors slippery. If you must use wax, use non-skid floor wax.  Do not have throw rugs and other things on the floor that can make you trip. What can I do with my stairs?  Do not leave any items on the stairs.  Make sure that there are handrails on both sides of the stairs and use them. Fix handrails that are broken or loose. Make sure that handrails are as long as the stairways.  Check any carpeting to make sure that it is firmly attached to the stairs. Fix any carpet that is loose or worn.  Avoid having throw rugs at the top or bottom of the stairs. If you do have throw rugs, attach them to the floor with carpet tape.  Make sure that you have a light switch at the top of the stairs and the bottom of the stairs. If you do not have them, ask someone to add them for you. What else can I do to help prevent falls?  Wear shoes that:  Do not have high heels.  Have rubber bottoms.  Are comfortable and fit you well.  Are closed at the toe. Do not wear sandals.  If you use a stepladder:  Make sure that it is fully opened. Do not climb a closed stepladder.  Make sure that both sides of the stepladder are locked into place.  Ask someone to hold it for you, if possible.  Clearly mark and make sure that you can see:  Any grab bars or handrails.  First and last steps.  Where the edge of each  step is.  Use tools that help you move around (mobility aids) if they are needed. These include:  Canes.  Walkers.  Scooters.  Crutches.  Turn on the lights when you go into a dark area. Replace any light bulbs as soon as they burn out.  Set up your furniture so you have a clear path. Avoid moving your furniture around.  If any of your floors are uneven, fix them.  If there are any pets around you, be aware of where they are.  Review your medicines with your doctor. Some medicines can make you feel dizzy. This can increase your chance of falling. Ask your doctor what other things that you can do to help prevent falls. This information is not intended to replace advice given to you by your health care provider. Make sure you discuss any questions you have with your health care provider. Document Released: 12/12/2008 Document Revised: 07/24/2015 Document Reviewed: 03/22/2014 Elsevier Interactive Patient Education  2017 [employer].

## 2016-12-20 ENCOUNTER — Other Ambulatory Visit: Payer: Self-pay

## 2016-12-20 DIAGNOSIS — E1121 Type 2 diabetes mellitus with diabetic nephropathy: Secondary | ICD-10-CM

## 2016-12-20 DIAGNOSIS — Z1159 Encounter for screening for other viral diseases: Secondary | ICD-10-CM

## 2016-12-20 DIAGNOSIS — E1169 Type 2 diabetes mellitus with other specified complication: Secondary | ICD-10-CM

## 2016-12-20 DIAGNOSIS — N183 Chronic kidney disease, stage 3 (moderate): Secondary | ICD-10-CM

## 2016-12-20 DIAGNOSIS — N1831 Chronic kidney disease, stage 3a: Secondary | ICD-10-CM

## 2016-12-20 DIAGNOSIS — Z794 Long term (current) use of insulin: Secondary | ICD-10-CM

## 2016-12-20 DIAGNOSIS — D509 Iron deficiency anemia, unspecified: Secondary | ICD-10-CM

## 2016-12-20 DIAGNOSIS — E785 Hyperlipidemia, unspecified: Secondary | ICD-10-CM

## 2016-12-20 DIAGNOSIS — E890 Postprocedural hypothyroidism: Secondary | ICD-10-CM

## 2016-12-21 LAB — COMPLETE METABOLIC PANEL WITH GFR
AG Ratio: 1.4 (calc) (ref 1.0–2.5)
ALT: 8 U/L (ref 6–29)
AST: 11 U/L (ref 10–35)
Albumin: 3.9 g/dL (ref 3.6–5.1)
Alkaline phosphatase (APISO): 62 U/L (ref 33–130)
BILIRUBIN TOTAL: 0.4 mg/dL (ref 0.2–1.2)
BUN / CREAT RATIO: 15 (calc) (ref 6–22)
BUN: 15 mg/dL (ref 7–25)
CALCIUM: 8.9 mg/dL (ref 8.6–10.4)
CO2: 30 mmol/L (ref 20–32)
Chloride: 104 mmol/L (ref 98–110)
Creat: 0.99 mg/dL — ABNORMAL HIGH (ref 0.60–0.93)
GFR, EST AFRICAN AMERICAN: 66 mL/min/{1.73_m2} (ref >=60)
GFR, EST NON AFRICAN AMERICAN: 57 mL/min/{1.73_m2} — AB (ref >=60)
GLOBULIN: 2.8 g/dL (ref 1.9–3.7)
Glucose, Bld: 155 mg/dL — ABNORMAL HIGH (ref 65–99)
POTASSIUM: 4.2 mmol/L (ref 3.5–5.3)
SODIUM: 140 mmol/L (ref 135–146)
TOTAL PROTEIN: 6.7 g/dL (ref 6.1–8.1)

## 2016-12-21 LAB — LIPID PANEL
Cholesterol: 147 mg/dL (ref <200)
HDL: 63 mg/dL (ref >50)
LDL Cholesterol (Calc): 64 mg/dL (calc)
NON-HDL CHOLESTEROL (CALC): 84 mg/dL (ref <130)
Total CHOL/HDL Ratio: 2.3 (calc) (ref <5.0)
Triglycerides: 113 mg/dL (ref <150)

## 2016-12-21 LAB — CBC WITH DIFFERENTIAL/PLATELET
BASOS ABS: 40 {cells}/uL (ref 0–200)
Basophils Relative: 0.5 %
EOS PCT: 5.2 %
Eosinophils Absolute: 411 cells/uL (ref 15–500)
HEMATOCRIT: 36.8 % (ref 35.0–45.0)
Hemoglobin: 12.3 g/dL (ref 11.7–15.5)
LYMPHS ABS: 2220 {cells}/uL (ref 850–3900)
MCH: 30 pg (ref 27.0–33.0)
MCHC: 33.4 g/dL (ref 32.0–36.0)
MCV: 89.8 fL (ref 80.0–100.0)
MPV: 10.5 fL (ref 7.5–12.5)
Monocytes Relative: 8.4 %
NEUTROS PCT: 57.8 %
Neutro Abs: 4566 cells/uL (ref 1500–7800)
Platelets: 268 10*3/uL (ref 140–400)
RBC: 4.1 10*6/uL (ref 3.80–5.10)
RDW: 11.7 % (ref 11.0–15.0)
Total Lymphocyte: 28.1 %
WBC: 7.9 10*3/uL (ref 3.8–10.8)
WBCMIX: 664 {cells}/uL (ref 200–950)

## 2016-12-21 LAB — HEMOGLOBIN A1C
HEMOGLOBIN A1C: 6.4 %{Hb} — AB (ref <5.7)
Mean Plasma Glucose: 137 (calc)
eAG (mmol/L): 7.6 (calc)

## 2016-12-21 LAB — HEPATITIS C ANTIBODY
Hepatitis C Ab: NONREACTIVE
SIGNAL TO CUT-OFF: 0.02 (ref <1.00)

## 2016-12-21 LAB — T4, FREE: Free T4: 1.5 ng/dL (ref 0.8–1.8)

## 2016-12-21 LAB — TSH: TSH: 0.38 mIU/L — ABNORMAL LOW (ref 0.40–4.50)

## 2016-12-23 ENCOUNTER — Encounter: Payer: Self-pay | Admitting: Family Medicine

## 2016-12-23 ENCOUNTER — Other Ambulatory Visit: Payer: Self-pay

## 2016-12-23 DIAGNOSIS — E1121 Type 2 diabetes mellitus with diabetic nephropathy: Secondary | ICD-10-CM

## 2016-12-23 DIAGNOSIS — F32A Depression, unspecified: Secondary | ICD-10-CM

## 2016-12-23 DIAGNOSIS — F329 Major depressive disorder, single episode, unspecified: Secondary | ICD-10-CM

## 2016-12-23 MED ORDER — FLUOXETINE HCL 10 MG PO CAPS: 10.0000 mg | ORAL_CAPSULE | Freq: Every day | ORAL | 3 refills | Status: DC

## 2016-12-23 MED ORDER — SITAGLIPTIN PHOSPHATE 100 MG PO TABS: 100.0000 mg | ORAL_TABLET | Freq: Every day | ORAL | 3 refills | Status: DC

## 2016-12-24 ENCOUNTER — Ambulatory Visit (INDEPENDENT_AMBULATORY_CARE_PROVIDER_SITE_OTHER): Payer: Medicare Other | Admitting: Family Medicine

## 2016-12-24 ENCOUNTER — Encounter: Payer: Self-pay | Admitting: Family Medicine

## 2016-12-24 VITALS — BP 139/60 | HR 63 | Temp 98.0°F | Resp 16 | Ht 66.0 in | Wt 248.0 lb

## 2016-12-24 DIAGNOSIS — E1121 Type 2 diabetes mellitus with diabetic nephropathy: Secondary | ICD-10-CM

## 2016-12-24 DIAGNOSIS — N183 Chronic kidney disease, stage 3 (moderate): Secondary | ICD-10-CM | POA: Diagnosis not present

## 2016-12-24 DIAGNOSIS — Z23 Encounter for immunization: Secondary | ICD-10-CM | POA: Diagnosis not present

## 2016-12-24 DIAGNOSIS — Z794 Long term (current) use of insulin: Secondary | ICD-10-CM | POA: Diagnosis not present

## 2016-12-24 DIAGNOSIS — E785 Hyperlipidemia, unspecified: Secondary | ICD-10-CM

## 2016-12-24 DIAGNOSIS — Z6841 Body Mass Index (BMI) 40.0 and over, adult: Secondary | ICD-10-CM

## 2016-12-24 DIAGNOSIS — G4733 Obstructive sleep apnea (adult) (pediatric): Secondary | ICD-10-CM | POA: Diagnosis not present

## 2016-12-24 DIAGNOSIS — N1831 Chronic kidney disease, stage 3a: Secondary | ICD-10-CM

## 2016-12-24 DIAGNOSIS — E1169 Type 2 diabetes mellitus with other specified complication: Secondary | ICD-10-CM

## 2016-12-24 DIAGNOSIS — Z9989 Dependence on other enabling machines and devices: Secondary | ICD-10-CM

## 2016-12-24 DIAGNOSIS — E890 Postprocedural hypothyroidism: Secondary | ICD-10-CM | POA: Diagnosis not present

## 2016-12-24 MED ORDER — ZOSTER VAC RECOMB ADJUVANTED 50 MCG/0.5ML IM SUSR: INTRAMUSCULAR | 1 refills | Status: DC

## 2016-12-24 NOTE — Patient Instructions (Addendum)
Thank you for coming to the clinic today.  1.  DIABETES REGIMEN: - Continue Bydureon BCise - INCREASE Metformin XR to 3 pills for 1578m DAILY in AM - if tolerating well and ready for new rx in future we can increase the pill amount you get, just let me know. POSSIBLY we can increase this in the future to 4 pills we may get to this point - REDUCE Januvia from 1064mto 5048m cut in half - after next 3 months with A1c, if still doing well, we can STOP Januvia.  For the future - two meds to consider   Jardiance - other brand of Farxiga  Farxiga (Dapagliflozin) - pill, discussed lower sugar by removing in urine, still very effective  2. Printed rx for Shingrix x 2 doses, printed, find a retail pharmacy, check cost  3. Colon Cancer Screening: - For all adults age 71+31+utine colon cancer screening is highly recommended.     - Recent guidelines from [place] starting age of 71 64Early detection of colon cancer is important, because often there are no warning signs or symptoms, also if found early usually it can be cured. Late stage is hard to treat.  - If you are not interested in Colonoscopy screening (if done and normal you could be cleared for 5 to 10 years until next due), then Cologuard is an excellent alternative for screening test for Colon Cancer. It is highly sensitive for detecting DNA of colon cancer from even the earliest stages. Also, there is NO bowel prep required. - If Cologuard is NEGATIVE, then it is good for 3 years before next due - If Cologuard is POSITIVE, then it is strongly advised to get a Colonoscopy, which allows the GI doctor to locate the source of the cancer or polyp (even very early stage) and treat it by removing it. ------------------------- If you would like to proceed with Cologuard (stool DNA test) - FIRST, call your insurance company and tell them you want to check cost of Cologuard tell them CPT Code [phone] may be completely  covered and you could get for no cost, OR max cost without any coverage is about $600). Also, keep in mind if you do NOT open the kit, and decide not to do the test, you will NOT be charged, you should contact the company if you decide not to do the test. - If you want to proceed, you can notify us [place] message, [facility] at next visit) and we will order it for you. The test kit will be delivered to you house within about 1 week. Follow instructions to collect sample, you may call the company for any help or questions, 24/7 telephone support at [phone] for NON FASTING BLOOD WORK  SCHEDULE "Lab Only" visit in the morning at the clinic for lab draw in 3 MONTHS  - Make sure Lab Only appointment is at about 1 week before your next appointment, so that results will be available  For Lab Results, once available within 2-3 days of blood draw, you can can log in to MyChart online to view your results and a brief explanation. Also, we can discuss results at next follow-up visit.   Please schedule a Follow-up Appointment to: Return in about 3 months (around 03/26/2017) for DM (lab result) med adjust, Thyroid.  If you have any other questions or concerns, please feel free to call the clinic or send a message through [place] may  also schedule an earlier appointment if necessary.  Additionally, you may be receiving a survey about your experience at our clinic within a few days to 1 week by e-mail or mail. We value your feedback.  Nobie Putnam, DO [place]

## 2016-12-24 NOTE — Progress Notes (Signed)
Subjective:    Patient ID: [name], female    DOB: [birth year], 71 y.o.   MRN: [id number]  [name] is a 71 y.o. female presenting on 12/24/2016 for Annual Exam and Diabetes   HPI   Here for Annual Exam and Lab Review.  CHRONIC DM, Type 2 with CKD-III - Last visit with me 09/22/16, for same problem, treated with new start GLP1 Bydureon BCise, titrate down Lantus insulin and switched from Metformin 1000mg  daily to XR 1000mg  daily, see prior notes for background information. - Interval update with she has started Bydureon and doing very well on it, regarding cost 30 day supply is just under $700, but then after donut hole it will be $300. Similarly Januvia is 300-400$. She was able to completely come off of Lantus Insulin after 3-4 weeks slow titratio - Today patient reports interested to continue reducing DM meds if possible CBGs: Avg 90-100, Low >80 (no hypoglycemia symptoms but she is on beta blocker coreg), High < 150. Checks CBGs x1 fasting AM Meds: - Bydureon BCise 2mg  weekly [place] inj. Metformin XR 1000mg  daily. Januvia 100mg  daily Reports good compliance.Tolerating well w/o side-effects Currently on ARB, ASA 81, Statin Lifestyle: - Diet (working on variety of healthy diets, DM and ketodiet) - Exercise (improved regular exercise) - Weight loss 6 lbs in about 1 month  HYPOTHYROIDISM, S/p surgical thyroidectomy - History of thyroid cancer and s/p thyroidectomy - Recent lab with mild low TSH 0.38 but normal Free T4 1.5 - She is taking Levothyroxine 133mcg daily - Today reports no new symptoms or concerns - Denies hair loss, dry skin, problems sleeping, reduced energy/fatigue  OSA, on CPAP - Patient reports prior history of dx OSA and on CPAP for >8years, prior to treatment initial symptoms were snoring, daytime sleepiness and fatigue, last sleep study done by [facility] - Today reports that sleep apnea is well controlled. She still uses an older CPAP machine  every night. Tolerates the machine well, and thinks that sleeps better with it and feels good. No new concerns or symptoms.  Health Maintenance: - UTD Flu Shot 11/30/16 - UTD Pneumonia Vaccine - UTD Mammogram 09/2016 - Colon CA Screening: Never had colonoscopy. Currently asymptomatic. No known family history of colon CA. Due for screening test considering Cologuard, counseling given   Depression screen [facility] 2/9 12/26/2016 12/24/2016 11/30/2016  Decreased Interest 0 0 0  Down, Depressed, Hopeless 0 0 0  PHQ - 2 Score 0 0 0  Altered sleeping 0 - -  Tired, decreased energy 0 - -  Change in appetite 0 - -  Feeling bad or failure about yourself  0 - -  Trouble concentrating 0 - -  Moving slowly or fidgety/restless 0 - -  Suicidal thoughts 0 - -  PHQ-9 Score 0 - -  Difficult doing work/chores Not difficult at all - -    Past Medical History:  Diagnosis Date  . Arthritis    knees   . Coronary artery disease    a. 07/2013 STEMI/PCI ([place]): LM nl, LAD 100p (thrombectomy & 3.5x23 [place] DES), LCX nl, RCA nl, EF 45%.  . Depression   . Diabetes mellitus, type II ([place])   . Endometrial cancer ([place])    THYROID CANCER  . Gravida 3 para 3   . Hyperlipidemia   . Hypertension   . Hypertensive heart disease   . Hypothyroid   . Ischemic cardiomyopathy    a. 07/2013 EF 45% @ time of STEMI;  b. 11/2013 Echo: EF 55-60%, mildly dil LA, nl RV fxn.  . Menopause   . Myocardial infarction ([place])    2016  . Obstructive sleep apnea on CPAP   . Osteoarthritis    Past Surgical History:  Procedure Laterality Date  . ABDOMINAL HYSTERECTOMY    . CARDIAC CATHETERIZATION    . CATARACT EXTRACTION  08/30/2016  . CATARACT EXTRACTION W/PHACO Left 08/30/2016   Procedure: CATARACT EXTRACTION PHACO AND INTRAOCULAR LENS PLACEMENT ([place]) Left daibetic;  Surgeon: Leandrew Koyanagi, MD;  Location: [place];  Service: Ophthalmology;  Laterality: Left;  Diabetic - insulin and oral meds sleep apnea  . CATARACT  EXTRACTION W/PHACO Right 10/07/2016   Procedure: CATARACT EXTRACTION PHACO AND INTRAOCULAR LENS PLACEMENT ([facility]);  Surgeon: Leandrew Koyanagi, MD;  Location: [facility];  Service: Ophthalmology;  Laterality: Right;  Lot# [id number] H [place]: 00:43.3 AP%: 12.2 CDE: 5.28  . CORONARY ANGIOPLASTY     drug eluting stent placed at [place]    . LAPAROSCOPIC HYSTERECTOMY    . THYROIDECTOMY     Social History   Social History  . Marital status: Married    Spouse name: N/A  . Number of children: N/A  . Years of education: N/A   Occupational History  . Not on file.   Social History Main Topics  . Smoking status: Never Smoker  . Smokeless tobacco: Never Used  . Alcohol use No  . Drug use: No  . Sexual activity: Not on file   Other Topics Concern  . Not on file   Social History Narrative  . No narrative on file   Family History  Problem Relation Age of Onset  . Heart attack Father   . Heart failure Father   . Hypertension Father   . Hyperlipidemia Father   . Heart disease Father 51       CABG   . Pulmonary fibrosis Son   . Breast cancer Neg Hx    Current Outpatient Prescriptions on File Prior to Visit  Medication Sig  . ACCU-CHEK AVIVA PLUS test strip U UTD BID  . acetaminophen (TYLENOL) 500 MG tablet Take 2 tablets by mouth 3 (three) times daily as needed.  Marland Kitchen amLODipine (NORVASC) 2.5 MG tablet Take 1 tablet (2.5 mg total) by mouth daily.  Marland Kitchen aspirin 81 MG tablet Take 81 mg by mouth daily.  . carvedilol (COREG) 3.125 MG tablet Take 1 tablet (3.125 mg total) by mouth 2 (two) times daily. (Patient taking differently: Take 3.125 mg by mouth daily. )  . Exenatide ER (BYDUREON BCISE) 2 MG/0.85ML AUIJ Inject 2 mg into the skin once a week.  Marland Kitchen FLUoxetine (PROZAC) 10 MG capsule Take 1 capsule (10 mg total) by mouth daily.  Marland Kitchen levothyroxine (SYNTHROID, LEVOTHROID) 150 MCG tablet Take 1 tablet (150 mcg total) by mouth daily.  Marland Kitchen losartan (COZAAR) 100 MG  tablet Take 1 tablet (100 mg total) by mouth daily.  . metFORMIN (GLUCOPHAGE-XR) 500 MG 24 hr tablet Take 2 tablets (1,000 mg total) by mouth daily with breakfast.  . Multiple Vitamin (MULTIVITAMIN) tablet Take 1 tablet by mouth 2 (two) times daily.  . nitroGLYCERIN (NITROSTAT) 0.4 MG SL tablet Place 1 tablet (0.4 mg total) under the tongue every 5 (five) minutes as needed for chest pain.  . Omega-3 Fatty Acids (OMEGA-3 PLUS PO) Take by mouth 2 (two) times daily.  Marland Kitchen POTASSIUM PO Take by mouth daily.  . rosuvastatin (CRESTOR) 20 MG tablet Take 1 tablet (  20 mg total) by mouth daily.  . sitaGLIPtin (JANUVIA) 100 MG tablet Take 1 tablet (100 mg total) by mouth daily.  . traMADol (ULTRAM) 50 MG tablet Take 1 tablet (50 mg total) by mouth every 8 (eight) hours as needed. (Patient not taking: Reported on 12/24/2016)   No current facility-administered medications on file prior to visit.     Review of Systems  Constitutional: Negative for activity change, appetite change, chills, diaphoresis, fatigue, fever and unexpected weight change.  HENT: Negative for congestion and hearing loss.   Eyes: Negative for visual disturbance.  Respiratory: Negative for apnea, cough, choking, chest tightness, shortness of breath and wheezing.   Cardiovascular: Negative for chest pain, palpitations and leg swelling.  Gastrointestinal: Negative for abdominal pain, anal bleeding, blood in stool, constipation, diarrhea, nausea and vomiting.  Endocrine: Negative for cold intolerance and polyuria.  Genitourinary: Negative for decreased urine volume, difficulty urinating, dysuria, frequency and hematuria.  Musculoskeletal: Negative for arthralgias, back pain and neck pain.  Skin: Negative for rash.  Allergic/Immunologic: Negative for environmental allergies.  Neurological: Negative for dizziness, weakness, light-headedness, numbness and headaches.  Hematological: Negative for adenopathy.  Psychiatric/Behavioral: Negative  for behavioral problems, dysphoric mood and sleep disturbance. The patient is not nervous/anxious.    Per HPI unless specifically indicated above     Objective:    BP 139/60   Pulse 63   Temp 98 F (36.7 C) (Other (Comment))   Resp 16   Ht 5\' 6"  (1.676 m)   Wt 248 lb (112.5 kg)   BMI 40.03 kg/m   Wt Readings from Last 3 Encounters:  12/24/16 248 lb (112.5 kg)  11/30/16 253 lb 9.6 oz (115 kg)  10/07/16 257 lb (116.6 kg)    Physical Exam  Constitutional: She is oriented to person, place, and time. She appears well-developed and well-nourished. No distress.  Well-appearing, comfortable, cooperative, obese  HENT:  Head: Normocephalic and atraumatic.  Mouth/Throat: Oropharynx is clear and moist.  Eyes: Conjunctivae are normal. Right eye exhibits no discharge. Left eye exhibits no discharge.  Neck: Normal range of motion. Neck supple. No thyromegaly present.  Cardiovascular: Normal rate, regular rhythm, normal heart sounds and intact distal pulses.   No murmur heard. Pulmonary/Chest: Effort normal and breath sounds normal. No respiratory distress. She has no wheezes. She has no rales.  Musculoskeletal: Normal range of motion. She exhibits no edema.  Lymphadenopathy:    She has no cervical adenopathy.  Neurological: She is alert and oriented to person, place, and time.  Skin: Skin is warm and dry. No rash noted. She is not diaphoretic. No erythema.  R toenail is pale and brittle seems to be growing back, partial missing  Psychiatric: She has a normal mood and affect. Her behavior is normal.  Well groomed, good eye contact, normal speech and thoughts  Nursing note and vitals reviewed.    Diabetic Foot Exam - Simple   Simple Foot Form Diabetic Foot exam was performed with the following findings:  Yes 12/24/2016  2:32 PM  Visual Inspection No deformities, no ulcerations, no other skin breakdown bilaterally:  Yes Sensation Testing Intact to touch and monofilament testing  bilaterally:  Yes Pulse Check Posterior Tibialis and Dorsalis pulse intact bilaterally:  Yes Comments Right toenail mild onychomycosis healing.     Recent Labs  03/25/16 1130 09/22/16 1329 12/20/16 0856  HGBA1C 6.9% 7.0* 6.4*     Results for orders placed or performed in visit on 12/20/16  Hepatitis C antibody  Result Value  Ref Range   Hepatitis C Ab NON-REACTIVE NON-REACTI   SIGNAL TO CUT-OFF 0.02 <1.00  TSH  Result Value Ref Range   TSH 0.38 (L) 0.40 - 4.50 mIU/L  T4, free  Result Value Ref Range   Free T4 1.5 0.8 - 1.8 ng/dL  Hemoglobin A1c  Result Value Ref Range   Hgb A1c MFr Bld 6.4 (H) <5.7 % of total Hgb   Mean Plasma Glucose 137 (calc)   eAG (mmol/L) 7.6 (calc)  COMPLETE METABOLIC PANEL WITH GFR  Result Value Ref Range   Glucose, Bld 155 (H) 65 - 99 mg/dL   BUN 15 7 - 25 mg/dL   Creat 0.99 (H) 0.60 - 0.93 mg/dL   GFR, Est Non African American 57 (L) > OR = 60 mL/min/1.27m2   GFR, Est African American 66 > OR = 60 mL/min/1.61m2   BUN/Creatinine Ratio 15 6 - 22 (calc)   Sodium 140 135 - 146 mmol/L   Potassium 4.2 3.5 - 5.3 mmol/L   Chloride 104 98 - 110 mmol/L   CO2 30 20 - 32 mmol/L   Calcium 8.9 8.6 - 10.4 mg/dL   Total Protein 6.7 6.1 - 8.1 g/dL   Albumin 3.9 3.6 - 5.1 g/dL   Globulin 2.8 1.9 - 3.7 g/dL (calc)   AG Ratio 1.4 1.0 - 2.5 (calc)   Total Bilirubin 0.4 0.2 - 1.2 mg/dL   Alkaline phosphatase (APISO) 62 33 - 130 U/L   AST 11 10 - 35 U/L   ALT 8 6 - 29 U/L  Lipid panel  Result Value Ref Range   Cholesterol 147 <200 mg/dL   HDL 63 >50 mg/dL   Triglycerides 113 <150 mg/dL   LDL Cholesterol (Calc) 64 mg/dL (calc)   Total CHOL/HDL Ratio 2.3 <5.0 (calc)   Non-HDL Cholesterol (Calc) 84 <130 mg/dL (calc)  CBC with Differential/Platelet  Result Value Ref Range   WBC 7.9 3.8 - 10.8 Thousand/uL   RBC 4.10 3.80 - 5.10 Million/uL   Hemoglobin 12.3 11.7 - 15.5 g/dL   HCT 36.8 35.0 - 45.0 %   MCV 89.8 80.0 - 100.0 fL   MCH 30.0 27.0 - 33.0 pg     MCHC 33.4 32.0 - 36.0 g/dL   RDW 11.7 11.0 - 15.0 %   Platelets 268 140 - 400 Thousand/uL   MPV 10.5 7.5 - 12.5 fL   Neutro Abs 4,566 1,500 - 7,800 cells/uL   Lymphs Abs 2,220 850 - 3,900 cells/uL   WBC mixed population 664 200 - 950 cells/uL   Eosinophils Absolute 411 15 - 500 cells/uL   Basophils Absolute 40 0 - 200 cells/uL   Neutrophils Relative % 57.8 %   Total Lymphocyte 28.1 %   Monocytes Relative 8.4 %   Eosinophils Relative 5.2 %   Basophils Relative 0.5 %      Assessment & Plan:   Problem List Items Addressed This Visit    Chronic kidney disease (CKD) stage G3a/A1, moderately decreased glomerular filtration rate (GFR) between 45-59 mL/min/1.73 square meter and albuminuria creatinine ratio less than 30 mg/g (HCC)    Stable CKD, last Cr improved 0.99 11/2016 Discussed dx CKD-III with patient Follow Cr trend in future Limit NSAIDs, improve hydration      Controlled type 2 diabetes mellitus with diabetic nephropathy (HCC) - Primary    Well-controlled DM with A1c 6.4 (improved from >7) Complications - CKD-III  Plan:  1. - Continue Bydureon BCise 2mg  weekly [place] inj - INCREASE Metformin XR  to 3 pills for 1500mg  DAILY in AM - if tolerating well and ready for new rx in future we can increase the pill amount. Possibly can increase dose to 2000mg  XR in AM - REDUCE Januvia from 100mg  to 50mg  - cut in half - after next 3 months with A1c, if still doing well, we can STOP Januvia (due to cost) - CHECK with insurance on coverage of SGLT2 Farxiga / Jardiance - may replace Januvia if cost is better, and can help wt loss 2. Encourage improved lifestyle - low carb, low sugar diet, reduce portion size, continue improving regular exercise 3. Check CBG, bring log to next visit for review 4. Continue ASA, ARB, Statin 5. DM Foot exam done today / Advised to schedule DM ophtho exam, send record 6. Follow-up 3 months DM lab A1c      Hyperlipidemia associated with type 2 diabetes mellitus  (HCC)    Controlled cholesterol, improved TG on statin and diet/lifestyle  LDL goal < 70 per Cards Last lipid panel 11/2016 ASCVD risk score is elevated due to known DM, CAD  Plan: 1. Continue current meds - Rosuvastatin 20mg  daily 2. Continue ASA 81mg  for secondary ASCVD risk reduction 3. Encourage improved lifestyle - low carb/cholesterol, reduce portion size, continue improving exercise 4. Follow-up 3 months, likely lipids only yearly      Hypothyroidism, postop    Mild low TSH but normal Free T4, Asymptomatic on current dose Levo Continue Levothyroxine 130mcg daily Re-check TSH / Free T4 in 3 months, adjust as needed      Morbid obesity with BMI of 40.0-44.9, adult (HCC)    Continued improve weight loss down 6 lbs in 1 month Likely benefit from GLP1 May consider SGLT2 in future Encouraged continue diet/lifestyle      Obstructive sleep apnea on CPAP    Well controlled, chronic OSA on CPAP, now >8 years - Last PSG [facility] >8 years ago - Good adherence to CPAP nightly - Continue current CPAP therapy, patient seems to be benefiting from therapy       Other Visit Diagnoses    Need for shingles vaccine       Relevant Medications   Zoster Vaccine Adjuvanted [facility]) injection      Meds ordered this encounter  Medications  . Zoster Vaccine Adjuvanted [facility]) injection    Sig: Inject vaccine 0.5 mL once and repeat dose within 6 months    Dispense:  0.5 mL    Refill:  1   Follow up plan: Return in about 3 months (around 03/26/2017) for DM (lab result) med adjust, Thyroid.  Nobie Putnam, [facility] 12/26/2016, 11:04 PM

## 2016-12-26 ENCOUNTER — Other Ambulatory Visit: Payer: Self-pay | Admitting: Family Medicine

## 2016-12-26 DIAGNOSIS — E890 Postprocedural hypothyroidism: Secondary | ICD-10-CM

## 2016-12-26 DIAGNOSIS — Z794 Long term (current) use of insulin: Principal | ICD-10-CM

## 2016-12-26 DIAGNOSIS — E1121 Type 2 diabetes mellitus with diabetic nephropathy: Secondary | ICD-10-CM

## 2016-12-26 NOTE — Assessment & Plan Note (Signed)
Continued improve weight loss down 6 lbs in 1 month Likely benefit from GLP1 May consider SGLT2 in future Encouraged continue diet/lifestyle

## 2016-12-26 NOTE — Assessment & Plan Note (Signed)
Controlled cholesterol, improved TG on statin and diet/lifestyle  LDL goal < 70 per Cards Last lipid panel 11/2016 ASCVD risk score is elevated due to known DM, CAD  Plan: 1. Continue current meds - Rosuvastatin 20mg  daily 2. Continue ASA 81mg  for secondary ASCVD risk reduction 3. Encourage improved lifestyle - low carb/cholesterol, reduce portion size, continue improving exercise 4. Follow-up 3 months, likely lipids only yearly

## 2016-12-26 NOTE — Assessment & Plan Note (Signed)
Well-controlled DM with A1c 6.4 (improved from >7) Complications - CKD-III  Plan:  1. - Continue Bydureon BCise 2mg  weekly [place] inj - INCREASE Metformin XR to 3 pills for 1500mg  DAILY in AM - if tolerating well and ready for new rx in future we can increase the pill amount. Possibly can increase dose to 2000mg  XR in AM - REDUCE Januvia from 100mg  to 50mg  - cut in half - after next 3 months with A1c, if still doing well, we can STOP Januvia (due to cost) - CHECK with insurance on coverage of SGLT2 Farxiga / Jardiance - may replace Januvia if cost is better, and can help wt loss 2. Encourage improved lifestyle - low carb, low sugar diet, reduce portion size, continue improving regular exercise 3. Check CBG, bring log to next visit for review 4. Continue ASA, ARB, Statin 5. DM Foot exam done today / Advised to schedule DM ophtho exam, send record 6. Follow-up 3 months DM lab A1c

## 2016-12-26 NOTE — Assessment & Plan Note (Signed)
Stable CKD, last Cr improved 0.99 11/2016 Discussed dx CKD-III with patient Follow Cr trend in future Limit NSAIDs, improve hydration

## 2016-12-26 NOTE — Assessment & Plan Note (Signed)
Mild low TSH but normal Free T4, Asymptomatic on current dose Levo Continue Levothyroxine 148mcg daily Re-check TSH / Free T4 in 3 months, adjust as needed

## 2016-12-26 NOTE — Assessment & Plan Note (Signed)
Well controlled, chronic OSA on CPAP, now >8 years - Last PSG [facility] >8 years ago - Good adherence to CPAP nightly - Continue current CPAP therapy, patient seems to be benefiting from therapy

## 2016-12-27 ENCOUNTER — Encounter: Payer: Self-pay | Admitting: Family Medicine

## 2016-12-28 ENCOUNTER — Other Ambulatory Visit: Payer: Self-pay | Admitting: Family Medicine

## 2016-12-28 DIAGNOSIS — Z1211 Encounter for screening for malignant neoplasm of colon: Secondary | ICD-10-CM

## 2017-01-04 LAB — COLOGUARD: Cologuard: NEGATIVE

## 2017-01-13 ENCOUNTER — Encounter: Payer: Self-pay | Admitting: Family Medicine

## 2017-01-17 NOTE — Progress Notes (Signed)
Cardiology Office Note  Date:  01/18/2017   ID:  Grace, Simmons [birth year], MRN [id number]  PCP:  Grace Hauser, DO   Chief Complaint  Patient presents with  . other    6 month f/u no complaints today. Meds reviewed verbally with pt.    HPI:  [name] is a pleasant 71 year old woman with past medical history of CAD June 2015, anterior ST elevation MI in [place]  thrombectomy and drug-eluting stent placement to the LAD.  EF at that time was 45%  echocardiogram in October 2015 showed normalization of LV function, at 55-60%.  hypertension, hyperlipidemia,  diabetes morbid obesity son passed in December 2016. She presents for follow-up of her coronary artery disease  In follow-up today her weight is down 20 pounds She is following a low carbohydrate, keto diet Active, Denies any chest pain with exertion, no significant shortness of breath No PND orthopnea Discussed diet with her in detail  Discussed her diabetic regiment with her Recent medication changes made On a new shot once per week  EKG personally reviewed by myself on todays visit Shows normal sinus rhythm rate 62 bpm no significant ST or T wave changes  Previous labs reviewed personally by myself and with the patient on todays visit Total cholesterol 147 Hemoglobin A1c 6.4    PMH:   has a past medical history of Arthritis, Coronary artery disease, Depression, Diabetes mellitus, type II ([place]), Endometrial cancer ([place]), Gravida 3 para 3, Hyperlipidemia, Hypertension, Hypertensive heart disease, Hypothyroid, Ischemic cardiomyopathy, Menopause, Myocardial infarction ([place]), Obstructive sleep apnea on CPAP, and Osteoarthritis.  PSH:    Past Surgical History:  Procedure Laterality Date  . ABDOMINAL HYSTERECTOMY    . CARDIAC CATHETERIZATION    . CATARACT EXTRACTION  08/30/2016  . CATARACT EXTRACTION PHACO AND INTRAOCULAR LENS PLACEMENT ([place]) Right 10/07/2016   Performed by Leandrew Koyanagi, MD at [facility] ORS  . CATARACT EXTRACTION PHACO AND INTRAOCULAR LENS PLACEMENT ([place]) Left daibetic Left 08/30/2016   Performed by Leandrew Koyanagi, MD at [facility]     drug eluting stent placed at [facility]    . LAPAROSCOPIC HYSTERECTOMY    . THYROIDECTOMY      Current Outpatient Medications  Medication Sig Dispense Refill  . ACCU-CHEK AVIVA PLUS test strip U UTD BID 100 each 12  . acetaminophen (TYLENOL) 500 MG tablet Take 2 tablets by mouth 3 (three) times daily as needed.    Marland Kitchen amLODipine (NORVASC) 2.5 MG tablet Take 1 tablet (2.5 mg total) by mouth daily. 90 tablet 3  . aspirin 81 MG tablet Take 81 mg by mouth daily.    . carvedilol (COREG) 3.125 MG tablet Take 1 tablet (3.125 mg total) by mouth 2 (two) times daily. (Patient taking differently: Take 3.125 mg by mouth daily. ) 180 tablet 3  . Exenatide ER (BYDUREON BCISE) 2 MG/0.85ML AUIJ Inject 2 mg into the skin once a week. 12 pen 3  . FLUoxetine (PROZAC) 10 MG capsule Take 1 capsule (10 mg total) by mouth daily. 90 capsule 3  . levothyroxine (SYNTHROID, LEVOTHROID) 150 MCG tablet Take 1 tablet (150 mcg total) by mouth daily. 90 tablet 3  . losartan (COZAAR) 100 MG tablet Take 1 tablet (100 mg total) by mouth daily. 90 tablet 3  . metFORMIN (GLUCOPHAGE-XR) 500 MG 24 hr tablet Take 2 tablets (1,000 mg total) by mouth daily with breakfast. (Patient taking differently: Take 1,500  mg by mouth daily with breakfast. ) 180 tablet 3  . nitroGLYCERIN (NITROSTAT) 0.4 MG SL tablet Place 1 tablet (0.4 mg total) under the tongue every 5 (five) minutes as needed for chest pain. 30 tablet 11  . rosuvastatin (CRESTOR) 20 MG tablet Take 1 tablet (20 mg total) by mouth daily. 90 tablet 3  . sitaGLIPtin (JANUVIA) 100 MG tablet Take 1 tablet (100 mg total) by mouth daily. 90 tablet 3  . traMADol (ULTRAM) 50 MG tablet Take 1 tablet (50 mg total) by mouth every 8 (eight) hours as  needed. 60 tablet 0  . Zoster Vaccine Adjuvanted [facility]) injection Inject vaccine 0.5 mL once and repeat dose within 6 months 0.5 mL 1   No current facility-administered medications for this visit.      Allergies:   Patient has no known allergies.   Social History:  The patient  reports that  has never smoked. she has never used smokeless tobacco. She reports that she does not drink alcohol or use drugs.   Family History:   family history includes Heart attack in her father; Heart disease (age of onset: 90) in her father; Heart failure in her father; Hyperlipidemia in her father; Hypertension in her father; Pulmonary fibrosis in her son.    Review of Systems: Review of Systems  Constitutional: Negative.   Respiratory: Negative.   Cardiovascular: Negative.   Gastrointestinal: Negative.   Musculoskeletal: Negative.   Neurological: Negative.   Psychiatric/Behavioral: Negative.   All other systems reviewed and are negative.    PHYSICAL EXAM: VS:  BP 110/64 (BP Location: Left Arm, Patient Position: Sitting, Cuff Size: Large)   Pulse 62   Ht 5\' 6"  (1.676 m)   Wt 246 lb 12 oz (111.9 kg)   BMI 39.83 kg/m  , BMI Body mass index is 39.83 kg/m. GEN: Well nourished, well developed, in no acute distress , obese HEENT: normal  Neck: no JVD, carotid bruits, or masses Cardiac: RRR; no murmurs, rubs, or gallops,no edema  Respiratory:  clear to auscultation bilaterally, normal work of breathing GI: soft, nontender, nondistended, + BS MS: no deformity or atrophy  Skin: warm and dry, no rash Neuro:  Strength and sensation are intact Psych: euthymic mood, full affect   Recent Labs: 12/20/2016: ALT 8; BUN 15; Creat 0.99; Hemoglobin 12.3; Platelets 268; Potassium 4.2; Sodium 140; TSH 0.38    Lipid Panel Lab Results  Component Value Date   CHOL 147 12/20/2016   HDL 63 12/20/2016   LDLCALC 91 12/11/2015   TRIG 113 12/20/2016      Wt Readings from Last 3 Encounters:  01/18/17  246 lb 12 oz (111.9 kg)  12/24/16 248 lb (112.5 kg)  11/30/16 253 lb 9.6 oz (115 kg)       ASSESSMENT AND PLAN:  Coronary artery disease of native artery of native heart with stable angina pectoris ([place]) - Plan: EKG 12-Lead Cholesterol is at goal on the current lipid regimen. No changes to the medications were made. Improved with weight loss  Essential (primary) hypertension - Plan: EKG 12-Lead Recommended she monitor blood pressure, if this continues to run low we will hold her amlodipine  Mixed hyperlipidemia - Plan: EKG 12-Lead LDL close to goal No changes made to her medications  Type 2 diabetes mellitus with other circulatory complication, with long-term current use of insulin ([place]) - Plan: EKG 12-Lead Dramatic improvement in her numbers likely through medication changes and weight loss Discussed diet with her again today  Chronic  kidney disease (CKD) stage G3a/A1,  Stable renal function Avoid NSAIDs She has aggressive diabetic regimen  Obstructive sleep apnea on CPAP Compliant with her cpap  Morbid obesity ([place]) We have encouraged continued exercise, careful diet management in an effort to lose weight. Doing very well with her weight loss plans  History of coronary artery stent placement - Plan: EKG 12-Lead Continue on aspirin  Ischemic cardiomyopathy - Plan: EKG 12-Lead  appears euvolemic no changes to her medication Stable  Total encounter time more than 25 minutes  Greater than 50% was spent in counseling and coordination of care with the patient ,  Disposition:   F/U  12 months   Orders Placed This Encounter  Procedures  . EKG 12-Lead     Signed, Esmond Plants, M.D., Ph.D. 01/18/2017  [place], [place]

## 2017-01-18 ENCOUNTER — Ambulatory Visit: Payer: Medicare Other | Admitting: Cardiovascular Disease

## 2017-01-18 ENCOUNTER — Encounter: Payer: Self-pay | Admitting: Cardiovascular Disease

## 2017-01-18 VITALS — BP 110/64 | HR 62 | Ht 66.0 in | Wt 246.8 lb

## 2017-01-18 DIAGNOSIS — I2510 Atherosclerotic heart disease of native coronary artery without angina pectoris: Secondary | ICD-10-CM

## 2017-01-18 DIAGNOSIS — E1121 Type 2 diabetes mellitus with diabetic nephropathy: Secondary | ICD-10-CM

## 2017-01-18 DIAGNOSIS — I10 Essential (primary) hypertension: Secondary | ICD-10-CM | POA: Diagnosis not present

## 2017-01-18 DIAGNOSIS — I25118 Atherosclerotic heart disease of native coronary artery with other forms of angina pectoris: Secondary | ICD-10-CM | POA: Diagnosis not present

## 2017-01-18 DIAGNOSIS — Z794 Long term (current) use of insulin: Secondary | ICD-10-CM

## 2017-01-18 DIAGNOSIS — E1169 Type 2 diabetes mellitus with other specified complication: Secondary | ICD-10-CM

## 2017-01-18 DIAGNOSIS — E785 Hyperlipidemia, unspecified: Secondary | ICD-10-CM | POA: Diagnosis not present

## 2017-01-18 DIAGNOSIS — I255 Ischemic cardiomyopathy: Secondary | ICD-10-CM

## 2017-01-18 DIAGNOSIS — Z6841 Body Mass Index (BMI) 40.0 and over, adult: Secondary | ICD-10-CM

## 2017-01-18 DIAGNOSIS — N1831 Chronic kidney disease, stage 3a: Secondary | ICD-10-CM

## 2017-01-18 DIAGNOSIS — N183 Chronic kidney disease, stage 3 (moderate): Secondary | ICD-10-CM

## 2017-01-18 NOTE — Patient Instructions (Signed)

## 2017-03-03 ENCOUNTER — Other Ambulatory Visit: Payer: Self-pay

## 2017-03-23 ENCOUNTER — Other Ambulatory Visit: Payer: Medicare Other

## 2017-03-23 DIAGNOSIS — Z794 Long term (current) use of insulin: Secondary | ICD-10-CM

## 2017-03-23 DIAGNOSIS — E890 Postprocedural hypothyroidism: Secondary | ICD-10-CM

## 2017-03-23 DIAGNOSIS — E1121 Type 2 diabetes mellitus with diabetic nephropathy: Secondary | ICD-10-CM

## 2017-03-24 LAB — ALLERGEN, BOTRYTIS CINEREA, M7: CLASS: 0

## 2017-03-24 LAB — HEMOGLOBIN A1C
Hgb A1c MFr Bld: 6.8 % of total Hgb — ABNORMAL HIGH (ref <5.7)
Mean Plasma Glucose: 148 (calc)
eAG (mmol/L): 8.2 (calc)

## 2017-03-24 LAB — INTERPRETATION:

## 2017-03-24 LAB — TSH: TSH: 0.09 mIU/L — ABNORMAL LOW (ref 0.40–4.50)

## 2017-03-24 LAB — T4, FREE: Free T4: 1.9 ng/dL — ABNORMAL HIGH (ref 0.8–1.8)

## 2017-03-29 ENCOUNTER — Other Ambulatory Visit: Payer: Self-pay

## 2017-03-29 DIAGNOSIS — I10 Essential (primary) hypertension: Secondary | ICD-10-CM

## 2017-03-29 MED ORDER — CARVEDILOL 3.125 MG PO TABS: 3.1250 mg | ORAL_TABLET | Freq: Two times a day (BID) | ORAL | 3 refills | Status: DC

## 2017-03-29 MED ORDER — AMLODIPINE BESYLATE 2.5 MG PO TABS: 2.5000 mg | ORAL_TABLET | Freq: Every day | ORAL | 3 refills | Status: DC

## 2017-03-30 ENCOUNTER — Other Ambulatory Visit: Payer: Self-pay | Admitting: Family Medicine

## 2017-03-30 ENCOUNTER — Ambulatory Visit (INDEPENDENT_AMBULATORY_CARE_PROVIDER_SITE_OTHER): Payer: Medicare Other | Admitting: Family Medicine

## 2017-03-30 ENCOUNTER — Other Ambulatory Visit: Payer: Self-pay

## 2017-03-30 ENCOUNTER — Encounter: Payer: Self-pay | Admitting: Family Medicine

## 2017-03-30 VITALS — BP 123/55 | HR 62 | Temp 97.5°F | Resp 18 | Ht 66.0 in | Wt 240.0 lb

## 2017-03-30 DIAGNOSIS — J0110 Acute frontal sinusitis, unspecified: Secondary | ICD-10-CM

## 2017-03-30 DIAGNOSIS — E1121 Type 2 diabetes mellitus with diabetic nephropathy: Secondary | ICD-10-CM

## 2017-03-30 DIAGNOSIS — E890 Postprocedural hypothyroidism: Secondary | ICD-10-CM

## 2017-03-30 DIAGNOSIS — I10 Essential (primary) hypertension: Secondary | ICD-10-CM

## 2017-03-30 MED ORDER — METFORMIN HCL ER 500 MG PO TB24: 1500.0000 mg | ORAL_TABLET | Freq: Every day | ORAL | 3 refills | Status: DC

## 2017-03-30 MED ORDER — AMOXICILLIN-POT CLAVULANATE 875-125 MG PO TABS: 1.0000 | ORAL_TABLET | Freq: Two times a day (BID) | ORAL | 0 refills | Status: DC

## 2017-03-30 MED ORDER — IPRATROPIUM BROMIDE 0.06 % NA SOLN: 2.0000 | Freq: Four times a day (QID) | NASAL | 0 refills | Status: DC

## 2017-03-30 MED ORDER — LEVOTHYROXINE SODIUM 137 MCG PO TABS: 137.0000 ug | ORAL_TABLET | Freq: Every day | ORAL | 3 refills | Status: DC

## 2017-03-30 NOTE — Assessment & Plan Note (Signed)
Further reduced TSH and elevated Free T4, concern too high dose Levothyroxine Clinical symptoms of hyperthyroid Reduce dose Levothyroxine from 157mcg to 181mcg daily new rx sent Check TSH free t4 in 3 months

## 2017-03-30 NOTE — Patient Instructions (Addendum)
Thank you for coming to the office today.  1.  A1c 6.8 up from prior 6.4, this is okay, I think Celesta Gentile is not really helping anymore now stable on Bydureon  DIABETES REGIMEN: - Continue Bydureon BCise - CONTINUE higher dose Metformin XR 3 pills for 1500mg  DAILY in AM - new rx sent to [place] for now  For the future - two meds to consider these check with insurance before next visit  SGLT2  Jardiance - other brand of Farxiga  Farxiga (Dapagliflozin) - pill, discussed lower sugar by removing in urine, still very effective   For sinus - continue mucinex, may use loratadine Start Atrovent nasal spray decongestant 2 sprays in each nostril up to 4 times daily for 7 days  If not improved can take Augmentin as prescribed especially with air travel Keep drinking fluids clear congestion Follow-up sooner as needed  ---------------------------------------------------  DUE for FASTING BLOOD WORK (no food or drink after midnight before the lab appointment, only water or coffee without cream/sugar on the morning of)  SCHEDULE "Lab Only" visit in the morning at the clinic for lab draw in 3 MONTHS   - Make sure Lab Only appointment is at about 1 week before your next appointment, so that results will be available  For Lab Results, once available within 2-3 days of blood draw, you can can log in to MyChart online to view your results and a brief explanation. Also, we can discuss results at next follow-up visit.   Please schedule a Follow-up Appointment to: Return in about 3 months (around 06/28/2017) for DM / Thyroid med adjust (lab review).    If you have any other questions or concerns, please feel free to call the office or send a message through [place]. You may also schedule an earlier appointment if necessary.  Additionally, you may be receiving a survey about your experience at our office within a few days to 1 week by e-mail or mail. We value your feedback.  Grace Putnam, DO [place]

## 2017-03-30 NOTE — Assessment & Plan Note (Signed)
Still controlled DM with A1c 6.8 up from 6.4 (improved from >7) Complications - CKD-III  Plan:  1. DISCONTINUE Januvia 100mg  QOD - since overlap with GLP1 - Continue Bydureon BCise 2mg  weekly [place] inj - CONTINUE Metformin XR to 3 pills for 1500mg  DAILY in AM ( new rx sent - can inc in future to x 4 daily if need) - CHECK with insurance on coverage of SGLT2 Farxiga / Jardiance - may add to med regimen next visit if A1c >7 2. Encourage improved lifestyle - low carb, low sugar diet, reduce portion size, continue improving regular exercise 3. Check CBG, bring log to next visit for review 4. Continue ASA, ARB, Statin 5. Follow-up 3 months DM lab A1c

## 2017-03-30 NOTE — Assessment & Plan Note (Signed)
Now BMI 38 (< 40) but still morbid obesity based on co morbid conditions DM2 Continued improve weight loss down 8 lbs in 3 month Likely benefit from GLP1 May consider SGLT2 in future Encouraged continue diet/lifestyle

## 2017-03-30 NOTE — Progress Notes (Signed)
Subjective:    Patient ID: [name], female    DOB: [birth year], 72 y.o.   MRN: [id number]  [name] is a 72 y.o. female presenting on 03/30/2017 for Diabetes (f/u also lab results) and Sinusitis (nasal congestion w/ bloody greenish mucus x 5 days )   HPI   CHRONIC DM, Type 2 with CKD-III Recent changes for DM, previous regimen of Lantus, Metformin, Januvia, has had variable A1c in past. Was started on new GLP1 Bydureon, then was able to titrate OFF Lantus insulin completely, and lowered Januvia dose. - Today reports still taking Januvia 100mg  but every OTHER day since cannot half pill, cost of Bydureon seems to be reduced now for 3 month supply $125, similar price for Januvia. see prior notes for background information. Meds: Metformin XR 1500mg  (x 3 daily AM), Bydureon BCise 2mg  weekly [place], Januvia 100mg  QOD CBGs: Avg 100-120, no low. High < 150. Checks CBGs x1 fasting AM Reports good compliance.Tolerating well w/o side-effects Currently on ARB, ASA 81, Statin Lifestyle: - Diet (working on variety of healthy diets, DM and ketodiet) - Exercise (improved regular exercise) - Weight loss 8 lbs in about 3 month  FOLLOW-UP HYPOTHYROIDISM, S/p surgical thyroidectomy - Last visit with me 11/2016, for same problem, treated with continued Levothyroxine 114mcg daily despite slightly low TSH, see prior notes for background information. - Interval update with lower TSH trend from 0.38 to 0.09 and elevated Free T4 - Today patient reports symptoms of some fatigue and feels "off" believes time to adjust levothyroxine, had been on 150 for a while - Denies hair loss, dry skin  Sinusitis Reports symptoms started 4-5 days ago with sinus congestion and then worsening to sinus pain and pressure described congestion as thicker green, and now some bloody nasal discharge. She was concerned about possible worsening congestion and down into chest and some coughing. 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 - Admits some mild lower temp at times, has a new thermometer, some feeling warmer and not chills - Denies fevers, sweats, body aches, nausea vomiting   Depression screen [facility] 2/9 12/26/2016 12/24/2016 11/30/2016  Decreased Interest 0 0 0  Down, Depressed, Hopeless 0 0 0  PHQ - 2 Score 0 0 0  Altered sleeping 0 - -  Tired, decreased energy 0 - -  Change in appetite 0 - -  Feeling bad or failure about yourself  0 - -  Trouble concentrating 0 - -  Moving slowly or fidgety/restless 0 - -  Suicidal thoughts 0 - -  PHQ-9 Score 0 - -  Difficult doing work/chores Not difficult at all - -    Social History   Tobacco Use  . Smoking status: Never Smoker  . Smokeless tobacco: Never Used  Substance Use Topics  . Alcohol use: No  . Drug use: No    Review of Systems Per HPI unless specifically indicated above     Objective:    BP (!) 123/55 (BP Location: Right Arm, Patient Position: Sitting, Cuff Size: Normal)   Pulse 62   Temp (!) 97.5  F (36.4 C) (Oral)   Resp 18   Ht 5\' 6"  (1.676 m)   Wt 240 lb (108.9 kg)   SpO2 100%   BMI 38.74 kg/m   Wt Readings from Last 3 Encounters:  03/30/17 240 lb (108.9 kg)  01/18/17 246 lb 12 oz (111.9 kg)  12/24/16 248 lb (112.5 kg)    Physical Exam  Constitutional: She is oriented to person, place, and time. She appears well-developed and well-nourished. No distress.  Mildly tired and ill appearing, mostly comfortable, cooperative, obese  HENT:  Head: Normocephalic and atraumatic.  Mouth/Throat: Oropharynx is clear and moist.  L >R maxillary sinuses mild tender. Nares patent  without purulence or edema. Bilateral TMs clear without erythema, effusion or bulging. Oropharynx minimal posterior nasal drainage without erythema, exudates, edema or asymmetry.  Eyes: Conjunctivae are normal. Right eye exhibits no discharge. Left eye exhibits no discharge.  Neck: Normal range of motion. Neck supple. No thyromegaly present.  Cardiovascular: Normal rate, regular rhythm, normal heart sounds and intact distal pulses.  No murmur heard. Pulmonary/Chest: Effort normal and breath sounds normal. No respiratory distress. She has no wheezes. She has no rales.  Musculoskeletal: Normal range of motion. She exhibits no edema.  Lymphadenopathy:    She has no cervical adenopathy.  Neurological: She is alert and oriented to person, place, and time.  Skin: Skin is warm and dry. No rash noted. She is not diaphoretic. No erythema.  Psychiatric: She has a normal mood and affect. Her behavior is normal.  Well groomed, good eye contact, normal speech and thoughts  Nursing note and vitals reviewed.  Results for orders placed or performed in visit on 03/23/17  TSH  Result Value Ref Range   TSH 0.09 (L) 0.40 - 4.50 mIU/L  T4, free  Result Value Ref Range   Free T4 1.9 (H) 0.8 - 1.8 ng/dL  Hemoglobin A1c  Result Value Ref Range   Hgb A1c MFr Bld 6.8 (H) <5.7 % of total Hgb   Mean Plasma Glucose 148 (calc)   eAG (mmol/L) 8.2 (calc)  Allergen, Botrytis cinerea, m7  Result Value Ref Range   Botrytis Cinerea (M7) IgE <0.10 kU/L   Class 0   Interpretation:  Result Value Ref Range   Interpretation        Assessment & Plan:   Problem List Items Addressed This Visit    Controlled type 2 diabetes mellitus with diabetic nephropathy ([place])    Still controlled DM with A1c 6.8 up from 6.4 (improved from >7) Complications - CKD-III  Plan:  1. DISCONTINUE Januvia 100mg  QOD - since overlap with GLP1 - Continue Bydureon BCise 2mg  weekly [place] inj - CONTINUE Metformin XR to 3 pills for 1500mg  DAILY  in AM ( new rx sent - can inc in future to x 4 daily if need) - CHECK with insurance on coverage of SGLT2 Farxiga / Jardiance - may add to med regimen next visit if A1c >7 2. Encourage improved lifestyle - low carb, low sugar diet, reduce portion size, continue improving regular exercise 3. Check CBG, bring log to next visit for review 4. Continue ASA, ARB, Statin 5. Follow-up 3 months DM lab A1c      Relevant Medications   metFORMIN (GLUCOPHAGE-XR) 500 MG 24 hr tablet   Hypothyroidism, postop - Primary    Further reduced TSH and elevated Free T4, concern too high dose Levothyroxine Clinical symptoms of hyperthyroid Reduce dose Levothyroxine from 164mcg to 114mcg daily new rx sent Check TSH  free t4 in 3 months      Relevant Medications   levothyroxine (SYNTHROID, LEVOTHROID) 137 MCG tablet   Morbid obesity (H[facility])    Now BMI 38 (< 40) but still morbid obesity based on co morbid conditions DM2 Continued improve weight loss down 8 lbs in 3 month Likely benefit from GLP1 May consider SGLT2 in future Encouraged continue diet/lifestyle      Relevant Medications   metFORMIN (GLUCOPHAGE-XR) 500 MG 24 hr tablet    Other Visit Diagnoses    Acute non-recurrent frontal sinusitis      Consistent with acute frontal/maxillary sinusitis, likely initially viral URI vs allergic rhinitis component with worsening concern for bacterial infection. Especially with upcoming air travel  Plan: 1. Reassurance, possibly still self limited - however with air travel next week given empiric antibiotics  Augmentin 875-125mg  PO BID x 10 days 2. Start Atrovent nasal spray decongestant 2 sprays in each nostril up to 4 times daily for 7 days 3. Mucinex OTC 4. Supportive care 5. Return criteria reviewed     Relevant Medications   amoxicillin-clavulanate (AUGMENTIN) 875-125 MG tablet   ipratropium (ATROVENT) 0.06 % nasal spray      Meds ordered this encounter  Medications  . levothyroxine (SYNTHROID,  LEVOTHROID) 137 MCG tablet    Sig: Take 1 tablet (137 mcg total) by mouth daily before breakfast.    Dispense:  90 tablet    Refill:  3    Dose change from 150 down to 137  . metFORMIN (GLUCOPHAGE-XR) 500 MG 24 hr tablet    Sig: Take 3 tablets (1,500 mg total) by mouth daily with breakfast.    Dispense:  270 tablet    Refill:  3    Dose change from 1000 to 1500mg  daily  . amoxicillin-clavulanate (AUGMENTIN) 875-125 MG tablet    Sig: Take 1 tablet by mouth 2 (two) times daily. For 10 days    Dispense:  20 tablet    Refill:  0  . ipratropium (ATROVENT) 0.06 % nasal spray    Sig: Place 2 sprays into both nostrils 4 (four) times daily. For up to 5-7 days then stop.    Dispense:  15 mL    Refill:  0      Follow up plan: Return in about 3 months (around 06/28/2017) for DM / Thyroid med adjust (lab review).  Future labs ordered in 3 months  Nobie Putnam, [facility] 03/30/2017, 11:39 PM

## 2017-04-25 ENCOUNTER — Other Ambulatory Visit: Payer: Self-pay | Admitting: Family Medicine

## 2017-04-25 ENCOUNTER — Telehealth: Payer: Self-pay | Admitting: Family Medicine

## 2017-04-25 NOTE — Telephone Encounter (Signed)
Received fax from [facility] regarding rejection of statin med, says non formulary Crestor. Alternative med recommended on formulary is Atorvastatin, 2nd option is Rosuvastatin.  She has been prescribed generic Rosuvastatin 20mg  daily - I do not see that this was rx for Brand name.  I would like to continue prescribing this generic Rosuvastatin for her. She does not need brand name Crestor.  If you could contact her [facility] pharmacy service or [facility] insurance at provider line [phone] as provided on form to help clarify this issue.  Nobie Putnam, [facility] 04/25/2017, 5:38 PM

## 2017-04-27 NOTE — Telephone Encounter (Signed)
I received another fax in my inbox, same form from [facility] regarding formulary.  I called [facility] pharmacy for her, and reviewed with the pharmacist that she is on generic Rosuvastatin 20mg , and there was $0 copay already shipped on 2/20 there is no problem, they do not see fax or other concerns. It may have been from [facility] insurance change and may affect her in future.  For now, no change shredded fax no further work needed.  Nobie Putnam, [facility] 04/27/2017, 5:28 PM

## 2017-05-16 LAB — HM DIABETES EYE EXAM

## 2017-05-18 ENCOUNTER — Encounter: Payer: Self-pay | Admitting: Family Medicine

## 2017-06-16 ENCOUNTER — Other Ambulatory Visit: Payer: Self-pay

## 2017-06-16 DIAGNOSIS — E1121 Type 2 diabetes mellitus with diabetic nephropathy: Secondary | ICD-10-CM

## 2017-06-16 DIAGNOSIS — I10 Essential (primary) hypertension: Secondary | ICD-10-CM

## 2017-06-16 DIAGNOSIS — E890 Postprocedural hypothyroidism: Secondary | ICD-10-CM

## 2017-06-17 ENCOUNTER — Other Ambulatory Visit: Payer: Medicare Other

## 2017-06-17 ENCOUNTER — Ambulatory Visit: Payer: Self-pay | Admitting: Family Medicine

## 2017-06-18 LAB — T4, FREE: Free T4: 1.9 ng/dL — ABNORMAL HIGH (ref 0.8–1.8)

## 2017-06-18 LAB — BASIC METABOLIC PANEL WITH GFR
BUN/Creatinine Ratio: 15 (calc) (ref 6–22)
BUN: 17 mg/dL (ref 7–25)
CALCIUM: 9.3 mg/dL (ref 8.6–10.4)
CHLORIDE: 103 mmol/L (ref 98–110)
CO2: 27 mmol/L (ref 20–32)
Creat: 1.15 mg/dL — ABNORMAL HIGH (ref 0.60–0.93)
GFR, Est African American: 55 mL/min/{1.73_m2} — ABNORMAL LOW (ref >=60)
GFR, Est Non African American: 47 mL/min/{1.73_m2} — ABNORMAL LOW (ref >=60)
GLUCOSE: 128 mg/dL — AB (ref 65–99)
POTASSIUM: 4.5 mmol/L (ref 3.5–5.3)
SODIUM: 139 mmol/L (ref 135–146)

## 2017-06-18 LAB — HEMOGLOBIN A1C
EAG (MMOL/L): 8.5 (calc)
HEMOGLOBIN A1C: 7 %{Hb} — AB (ref <5.7)
Mean Plasma Glucose: 154 (calc)

## 2017-06-18 LAB — TSH: TSH: 0.27 m[IU]/L — AB (ref 0.40–4.50)

## 2017-06-20 ENCOUNTER — Encounter: Payer: Self-pay | Admitting: Family Medicine

## 2017-06-23 ENCOUNTER — Ambulatory Visit (INDEPENDENT_AMBULATORY_CARE_PROVIDER_SITE_OTHER): Payer: Medicare Other | Admitting: Family Medicine

## 2017-06-23 ENCOUNTER — Other Ambulatory Visit: Payer: Self-pay | Admitting: Family Medicine

## 2017-06-23 ENCOUNTER — Encounter: Payer: Self-pay | Admitting: Family Medicine

## 2017-06-23 VITALS — BP 127/54 | HR 56 | Temp 98.2°F | Resp 16 | Ht 66.0 in | Wt 239.0 lb

## 2017-06-23 DIAGNOSIS — E890 Postprocedural hypothyroidism: Secondary | ICD-10-CM

## 2017-06-23 DIAGNOSIS — E1121 Type 2 diabetes mellitus with diabetic nephropathy: Secondary | ICD-10-CM

## 2017-06-23 DIAGNOSIS — N183 Chronic kidney disease, stage 3 unspecified: Secondary | ICD-10-CM

## 2017-06-23 DIAGNOSIS — I10 Essential (primary) hypertension: Secondary | ICD-10-CM

## 2017-06-23 DIAGNOSIS — H9313 Tinnitus, bilateral: Secondary | ICD-10-CM

## 2017-06-23 MED ORDER — LEVOTHYROXINE SODIUM 125 MCG PO TABS: 137.0000 ug | ORAL_TABLET | Freq: Every day | ORAL | 3 refills | Status: DC

## 2017-06-23 NOTE — Patient Instructions (Addendum)
Thank you for coming to the office today.  Keep up the good work, and continue to improve to help the medicines control sugar  REDUCED LEvothyroxine from 137 down to 141mcg daily - due to lab results will re-check in 3 months  For Ear Buzzing Start nasal steroid Flonase 2 sprays in each nostril daily for 4-6 weeks, may repeat course seasonally or as needed Try to treat the underlying fluid / allergies may be contributing Next step if still persistent - may consider a Carotid artery ultrasound to rule out any potential blockages there or vascular issue, may review with Dr Rockey Situ as well in the winter.  DUE for FASTING BLOOD WORK (no food or drink after midnight before the lab appointment, only water or coffee without cream/sugar on the morning of)  SCHEDULE "Lab Only" visit in the morning at the clinic for lab draw in 3 MONTHS   - Make sure Lab Only appointment is at about 1 week before your next appointment, so that results will be available  For Lab Results, once available within 2-3 days of blood draw, you can can log in to MyChart online to view your results and a brief explanation. Also, we can discuss results at next follow-up visit.   Please schedule a Follow-up Appointment to: Return in about 3 months (around 09/22/2017) for DM, Thyroid lab results, CKD, Ear Buzzing.  If you have any other questions or concerns, please feel free to call the office or send a message through [place]. You may also schedule an earlier appointment if necessary.  Additionally, you may be receiving a survey about your experience at our office within a few days to 1 week by e-mail or mail. We value your feedback.  Nobie Putnam, DO [place]

## 2017-06-23 NOTE — Progress Notes (Signed)
Subjective:    Patient ID: [name], female    DOB: 03-10-1945, 72 y.o.   MRN: [id number]  [name] is a 72 y.o. female presenting on 06/23/2017 for Hypothyroidism and Diabetes   HPI   Bilateral Ear Buzzing Noise Describes constant noise "buzzing". Worse in past 1 week, previously present, still hears if hold ears, no history of carotid problem but has history of vascular disease prior MI, [place], [place] available. Her next cardiology apt with Dr Rockey Situ is in 12/2017, for yearly check. Recent sinusitis and also allergy symptoms with congestion and some ear fullness. No longer using Flonase. Denies fever chills sinus pain purulent drainage, headache  CHRONIC DM, Type 2 with CKD-III Today reports doing well. Her A1c slightly inc from 6.8 to 7.0 after med adjust. She is tolerating meds well now on GLP1 instead of DPP4. She is saving money off Januvia. CBGs: Avg100-120, no low. High < 150. Checks CBGs x1 fasting AM - Last home CBG today 113 Meds: - Metformin XR 1500mg  daily x 3 in AM - she switched temporarily to BID dosing - Bydureon BCise 2mg  weekly [place] Reports good compliance (forgets bydureon rarely)Tolerating well w/o side-effects Currently on ARB, ASA 81, Statin Lifestyle: - Diet (recent set backs not as adherent to diet with life stress and changes, now working on variety of healthy diets, DM and ketodiet) - Exercise (improved regular exercise) - Weight loss7lbs in about 5 months  FOLLOW-UP HYPOTHYROIDISM, S/p surgical thyroidectomy Last visit with me 03/2017, for same problem, treated with reduced Levothyroxine 166mcg to 155mcg daily slightly low TSH, see prior notes for background information. - Interval update with TSH trend increased slightly but still low. 0.38 to 0.09 and elevated Free T4 - Today patient report no symptoms, usually does not notice Admits to chronic hair loss  Depression screen [facility] 2/9 06/23/2017  12/26/2016 12/24/2016  Decreased Interest 2 0 0  Down, Depressed, Hopeless 2 0 0  PHQ - 2 Score 4 0 0  Altered sleeping 1 0 -  Tired, decreased energy 1 0 -  Change in appetite 0 0 -  Feeling bad or failure about yourself  0 0 -  Trouble concentrating 0 0 -  Moving slowly or fidgety/restless 0 0 -  Suicidal thoughts 0 0 -  PHQ-9 Score 6 0 -  Difficult doing work/chores Not difficult at all Not difficult at all -    Social History   Tobacco Use  . Smoking status: Never Smoker  . Smokeless tobacco: Never Used  Substance Use Topics  . Alcohol use: No  . Drug use: No    Review of Systems Per HPI unless specifically indicated above     Objective:    BP (!) 127/54   Pulse (!) 56   Temp 98.2 F (36.8 C) (Oral)   Resp 16   Ht 5\' 6"  (1.676 m)   Wt 239 lb (108.4 kg)   BMI 38.58 kg/m   Wt Readings from Last 3 Encounters:  06/23/17 239 lb (108.4 kg)  03/30/17 240 lb (108.9 kg)  01/18/17 246 lb 12 oz (111.9 kg)    Physical Exam  Constitutional: She is oriented to person, place, and time. She appears well-developed and well-nourished. No distress.  Well appearing, mostly comfortable, cooperative, obese  HENT:  Head: Normocephalic and atraumatic.  Mouth/Throat: Oropharynx is clear and moist.  Nares patent without purulence or edema. Bilateral TMs clear with only minimal  clear effusion L>R, without erythema or bulging. Oropharynx clear  Eyes: Conjunctivae are normal. Right eye exhibits no discharge. Left eye exhibits no discharge.  Neck: Normal range of motion. Neck supple. No thyromegaly present.  Cardiovascular: Normal rate, regular rhythm, normal heart sounds and intact distal pulses.  No murmur heard. Pulmonary/Chest: Effort normal and breath sounds normal. No respiratory distress. She has no wheezes. She has no rales.  Musculoskeletal: Normal range of motion. She exhibits no edema.  Lymphadenopathy:    She has no cervical adenopathy.  Neurological: She is alert and  oriented to person, place, and time.  Skin: Skin is warm and dry. No rash noted. She is not diaphoretic. No erythema.  Psychiatric: She has a normal mood and affect. Her behavior is normal.  Well groomed, good eye contact, normal speech and thoughts  Nursing note and vitals reviewed.  Recent Labs    12/20/16 0856 03/23/17 1318 06/17/17 1024  HGBA1C 6.4* 6.8* 7.0*    Results for orders placed or performed in visit on 17/51/02  BASIC METABOLIC PANEL WITH GFR  Result Value Ref Range   Glucose, Bld 128 (H) 65 - 99 mg/dL   BUN 17 7 - 25 mg/dL   Creat 1.15 (H) 0.60 - 0.93 mg/dL   GFR, Est Non African American 47 (L) > OR = 60 mL/min/1.54m2   GFR, Est African American 55 (L) > OR = 60 mL/min/1.57m2   BUN/Creatinine Ratio 15 6 - 22 (calc)   Sodium 139 135 - 146 mmol/L   Potassium 4.5 3.5 - 5.3 mmol/L   Chloride 103 98 - 110 mmol/L   CO2 27 20 - 32 mmol/L   Calcium 9.3 8.6 - 10.4 mg/dL  T4, free  Result Value Ref Range   Free T4 1.9 (H) 0.8 - 1.8 ng/dL  TSH  Result Value Ref Range   TSH 0.27 (L) 0.40 - 4.50 mIU/L  Hemoglobin A1c  Result Value Ref Range   Hgb A1c MFr Bld 7.0 (H) <5.7 % of total Hgb   Mean Plasma Glucose 154 (calc)   eAG (mmol/L) 8.5 (calc)      Assessment & Plan:   Problem List Items Addressed This Visit    CKD (chronic kidney disease), stage III (HCC)    Slightly elevated Cr up to 1.15 from prior 0.9 to 1 Suspect related to HTN, DM, age Limit NSAIDs, improve hydration Follow Cr trend in future - next 3 mo      Controlled type 2 diabetes mellitus with diabetic nephropathy (HCC)    Still controlled DM with A1c 7.0 from 6.8  Complications - CKD-III Off Januvia  Plan:  1. Continue Bydureon BCise 2mg  weekly [place] inj - CONTINUE Metformin XR to 3 pills for 1500mg  DAILY in AM 2. Encourage improved lifestyle - low carb, low sugar diet, reduce portion size, continue improving regular exercise 3. Check CBG, bring log to next visit for review 4. Continue ASA,  ARB, Statin 5. Follow-up 3 months DM lab A1c      Ear noise/buzzing, bilateral    Uncertain etiology Exam reassuring today, seems possible to be related to sinusitis and allergic rhinosinusitis, some clear effusion Start nasal steroid Flonase 2 sprays in each nostril daily for 4-6 weeks, may repeat course seasonally or as needed Reviewed her vascular history cannot rule out vascular abnormality - would consider discuss w/ Cardiology as scheduled, or sooner if not resolved can consider Carotid Dopplers vs ENT      Hypothyroidism, postop - Primary  After recent reduced dose Levo 150 to 137 Slightly improved but still low TSH, elevated Free T4, concern still too high dose Levothyroxine Stable w/o significant symptoms Reduce dose Levothyroxine from 183mcg to 175mcg daily new rx sent Check TSH free T4 in 3 months again      Relevant Medications   levothyroxine (SYNTHROID, LEVOTHROID) 125 MCG tablet      Meds ordered this encounter  Medications  . levothyroxine (SYNTHROID, LEVOTHROID) 125 MCG tablet    Sig: Take 1 tablet (125 mcg total) by mouth daily before breakfast.    Dispense:  90 tablet    Refill:  3    Dose reduction from 137 to 125     Follow up plan: Return in about 3 months (around 09/22/2017) for DM, Thyroid lab results, CKD, Ear Buzzing.  Future labs ordered for 09/26/17  Nobie Putnam, [facility] 06/23/2017, 5:11 PM

## 2017-06-23 NOTE — Assessment & Plan Note (Signed)
After recent reduced dose Levo 150 to 137 Slightly improved but still low TSH, elevated Free T4, concern still too high dose Levothyroxine Stable w/o significant symptoms Reduce dose Levothyroxine from 158mcg to 170mcg daily new rx sent Check TSH free T4 in 3 months again

## 2017-06-23 NOTE — Assessment & Plan Note (Signed)
Uncertain etiology Exam reassuring today, seems possible to be related to sinusitis and allergic rhinosinusitis, some clear effusion Start nasal steroid Flonase 2 sprays in each nostril daily for 4-6 weeks, may repeat course seasonally or as needed Reviewed her vascular history cannot rule out vascular abnormality - would consider discuss w/ Cardiology as scheduled, or sooner if not resolved can consider Carotid Dopplers vs ENT

## 2017-06-23 NOTE — Assessment & Plan Note (Signed)
Slightly elevated Cr up to 1.15 from prior 0.9 to 1 Suspect related to HTN, DM, age Limit NSAIDs, improve hydration Follow Cr trend in future - next 3 mo

## 2017-06-23 NOTE — Assessment & Plan Note (Signed)
Still controlled DM with A1c 7.0 from 6.8  Complications - CKD-III Off Januvia  Plan:  1. Continue Bydureon BCise 2mg  weekly [place] inj - CONTINUE Metformin XR to 3 pills for 1500mg  DAILY in AM 2. Encourage improved lifestyle - low carb, low sugar diet, reduce portion size, continue improving regular exercise 3. Check CBG, bring log to next visit for review 4. Continue ASA, ARB, Statin 5. Follow-up 3 months DM lab A1c

## 2017-06-24 ENCOUNTER — Ambulatory Visit: Payer: Self-pay | Admitting: Family Medicine

## 2017-07-22 ENCOUNTER — Other Ambulatory Visit: Payer: Self-pay | Admitting: Family Medicine

## 2017-07-22 DIAGNOSIS — E1121 Type 2 diabetes mellitus with diabetic nephropathy: Secondary | ICD-10-CM

## 2017-07-22 DIAGNOSIS — Z794 Long term (current) use of insulin: Principal | ICD-10-CM

## 2017-08-15 ENCOUNTER — Other Ambulatory Visit: Payer: Self-pay | Admitting: Family Medicine

## 2017-08-15 DIAGNOSIS — E1169 Type 2 diabetes mellitus with other specified complication: Secondary | ICD-10-CM

## 2017-08-15 DIAGNOSIS — E785 Hyperlipidemia, unspecified: Principal | ICD-10-CM

## 2017-09-08 ENCOUNTER — Other Ambulatory Visit: Payer: Self-pay | Admitting: Family Medicine

## 2017-09-16 DIAGNOSIS — G4733 Obstructive sleep apnea (adult) (pediatric): Secondary | ICD-10-CM | POA: Diagnosis not present

## 2017-09-26 ENCOUNTER — Other Ambulatory Visit: Payer: Medicare Other

## 2017-09-26 DIAGNOSIS — E1121 Type 2 diabetes mellitus with diabetic nephropathy: Secondary | ICD-10-CM | POA: Diagnosis not present

## 2017-09-26 DIAGNOSIS — I10 Essential (primary) hypertension: Secondary | ICD-10-CM | POA: Diagnosis not present

## 2017-09-26 DIAGNOSIS — N183 Chronic kidney disease, stage 3 unspecified: Secondary | ICD-10-CM

## 2017-09-26 DIAGNOSIS — E890 Postprocedural hypothyroidism: Secondary | ICD-10-CM | POA: Diagnosis not present

## 2017-09-27 LAB — T4, FREE: Free T4: 1.4 ng/dL (ref 0.8–1.8)

## 2017-09-27 LAB — BASIC METABOLIC PANEL WITH GFR
BUN / CREAT RATIO: 16 (calc) (ref 6–22)
BUN: 16 mg/dL (ref 7–25)
CO2: 25 mmol/L (ref 20–32)
CREATININE: 1.02 mg/dL — AB (ref 0.60–0.93)
Calcium: 8.8 mg/dL (ref 8.6–10.4)
Chloride: 102 mmol/L (ref 98–110)
GFR, EST NON AFRICAN AMERICAN: 55 mL/min/{1.73_m2} — AB (ref >=60)
GFR, Est African American: 64 mL/min/{1.73_m2} (ref >=60)
GLUCOSE: 148 mg/dL — AB (ref 65–99)
Potassium: 4.6 mmol/L (ref 3.5–5.3)
SODIUM: 137 mmol/L (ref 135–146)

## 2017-09-27 LAB — HEMOGLOBIN A1C
Hgb A1c MFr Bld: 7.3 % of total Hgb — ABNORMAL HIGH (ref <5.7)
Mean Plasma Glucose: 163 (calc)
eAG (mmol/L): 9 (calc)

## 2017-09-27 LAB — TSH: TSH: 0.95 mIU/L (ref 0.40–4.50)

## 2017-09-28 ENCOUNTER — Ambulatory Visit (INDEPENDENT_AMBULATORY_CARE_PROVIDER_SITE_OTHER): Payer: Medicare Other | Admitting: Family Medicine

## 2017-09-28 ENCOUNTER — Encounter: Payer: Self-pay | Admitting: Family Medicine

## 2017-09-28 ENCOUNTER — Other Ambulatory Visit: Payer: Self-pay | Admitting: Family Medicine

## 2017-09-28 VITALS — BP 121/51 | HR 57 | Temp 98.3°F | Resp 16 | Ht 66.0 in | Wt 243.0 lb

## 2017-09-28 DIAGNOSIS — E1121 Type 2 diabetes mellitus with diabetic nephropathy: Secondary | ICD-10-CM

## 2017-09-28 DIAGNOSIS — N183 Chronic kidney disease, stage 3 unspecified: Secondary | ICD-10-CM

## 2017-09-28 DIAGNOSIS — E1169 Type 2 diabetes mellitus with other specified complication: Secondary | ICD-10-CM

## 2017-09-28 DIAGNOSIS — I252 Old myocardial infarction: Secondary | ICD-10-CM

## 2017-09-28 DIAGNOSIS — E890 Postprocedural hypothyroidism: Secondary | ICD-10-CM

## 2017-09-28 DIAGNOSIS — E785 Hyperlipidemia, unspecified: Secondary | ICD-10-CM

## 2017-09-28 DIAGNOSIS — Z0000 Encounter for general adult medical examination without abnormal findings: Secondary | ICD-10-CM

## 2017-09-28 NOTE — Progress Notes (Signed)
Subjective:    Patient ID: [name], female    DOB: [birth year], 72 y.o.   MRN: [id number]  [name] is a 72 y.o. female presenting on 09/28/2017 for Diabetes and Hypothyroidism   HPI   CHRONIC DM, Type 2 with CKD-III Today reports doing well. Her A1c slightly inc from 7 up to 7.3, attributed to some lifestyle factors CBGs: unchanged mostly Avg100-120,no low.High < 150. Checks CBGs x1 fasting AM Meds: - Metformin XR 1500mg  daily x 3 in AM - Bydureon BCise 2mg  weekly [place] - she admits she is in the medicare donut hole, and now cost for bydureon is up to $500 for 12 weeks, she can manage this currently and it is cheaper than insulin previously Reports good compliance, tolerating well w/o side-effects Currently on ARB, ASA 81, Statin Lifestyle: - Diet (still not as adherent to lifestyle with diet, but improving this, feels needs to get back to home cooking more instead of eating out) - Exercise (improved regular exercise) - Weight up 3-4 lbs in 3 months Denies hypoglycemia, polyuria, visual changes, numbness or tingling.  FOLLOW-UPHYPOTHYROIDISM, S/p surgical thyroidectomy Last visit with me 05/2017, for same problem, recent reduced dose Levothyroxine 150mcg to 137mcg daily and then AGAIN reduced from 137 to 125 at last visit due to slightly low TSH, see prior notes for background information. - Interval update withTSH trend now back in normal range 0.95 (08/2017) and normal Free T4 on current dose - Today patient report no symptoms - She usually does not notice problems if thyroid is off Denies worse weight gain, edema, fatigue, tired, skin changes  CKD-III Last lab 08/2017 was improved Cr, previously slightly elevated 1.15 Tries to stay hydrated. No new concerns.   Depression screen [facility] 2/9 06/23/2017 12/26/2016 12/24/2016  Decreased Interest 2 0 0  Down, Depressed, Hopeless 2 0 0  PHQ - 2 Score 4 0 0  Altered sleeping 1 0 -  Tired, decreased energy 1 0 -    Change in appetite 0 0 -  Feeling bad or failure about yourself  0 0 -  Trouble concentrating 0 0 -  Moving slowly or fidgety/restless 0 0 -  Suicidal thoughts 0 0 -  PHQ-9 Score 6 0 -  Difficult doing work/chores Not difficult at all Not difficult at all -    Social History   Tobacco Use  . Smoking status: Never Smoker  . Smokeless tobacco: Never Used  Substance Use Topics  . Alcohol use: No  . Drug use: No    Review of Systems Per HPI unless specifically indicated above     Objective:    BP (!) 121/51   Pulse (!) 57   Temp 98.3 F (36.8 C) (Oral)   Resp 16   Ht 5\' 6"  (1.676 m)   Wt 243 lb (110.2 kg)   BMI 39.22 kg/m   Wt Readings from Last 3 Encounters:  09/28/17 243 lb (110.2 kg)  06/23/17 239 lb (108.4 kg)  03/30/17 240 lb (108.9 kg)    Physical Exam  Constitutional: She is oriented to person, place, and time. She appears well-developed and well-nourished. No distress.  Well-appearing, comfortable, cooperative, obese  HENT:  Head: Normocephalic and atraumatic.  Mouth/Throat: Oropharynx is clear and moist.  Eyes: Conjunctivae are normal. Right eye exhibits no discharge. Left eye exhibits no discharge.  Neck: Normal range of motion. Neck supple. No thyromegaly present.  Cardiovascular: Normal rate, regular rhythm, normal heart sounds and intact distal pulses.  No murmur heard. Pulmonary/Chest: Effort normal and breath sounds normal. No respiratory distress. She has no wheezes. She has no rales.  Musculoskeletal: Normal range of motion. She exhibits no edema.  Lymphadenopathy:    She has no cervical adenopathy.  Neurological: She is alert and oriented to person, place, and time.  Skin: Skin is warm and dry. No rash noted. She is not diaphoretic. No erythema.  Psychiatric: She has a normal mood and affect. Her behavior is normal.  Well groomed, good eye contact, normal speech and thoughts  Nursing note and vitals reviewed.  Results for orders placed or  performed in visit on 09/26/17  T4, free  Result Value Ref Range   Free T4 1.4 0.8 - 1.8 ng/dL  TSH  Result Value Ref Range   TSH 0.95 0.40 - 4.50 mIU/L  BASIC METABOLIC PANEL WITH GFR  Result Value Ref Range   Glucose, Bld 148 (H) 65 - 99 mg/dL   BUN 16 7 - 25 mg/dL   Creat 1.02 (H) 0.60 - 0.93 mg/dL   GFR, Est Non African American 55 (L) > OR = 60 mL/min/1.21m2   GFR, Est African American 64 > OR = 60 mL/min/1.69m2   BUN/Creatinine Ratio 16 6 - 22 (calc)   Sodium 137 135 - 146 mmol/L   Potassium 4.6 3.5 - 5.3 mmol/L   Chloride 102 98 - 110 mmol/L   CO2 25 20 - 32 mmol/L   Calcium 8.8 8.6 - 10.4 mg/dL  Hemoglobin A1c  Result Value Ref Range   Hgb A1c MFr Bld 7.3 (H) <5.7 % of total Hgb   Mean Plasma Glucose 163 (calc)   eAG (mmol/L) 9.0 (calc)      Assessment & Plan:   Problem List Items Addressed This Visit    CKD (chronic kidney disease), stage III (HCC)    Improved Cr to 1, previously up to 1.15 Likely related to DM, HTN, Age  Continue on controlling chronic problem contributing Limit NSAIDs, and improve hydration Follow-up next Cr check 3 months with physical labs then likely q 6 months      Controlled type 2 diabetes mellitus with diabetic nephropathy ([place]) - Primary    Still controlled DM slightly elevated A1c up from 7 to 7.3 Complications - CKD-III Prior meds: Januvia, insulin  Plan:  1. Continue Bydureon BCise 2mg  weekly [place] inj - we discussed alternative GLP1 option such as Ozempic for possibly more effective A1c control and better wt management - will check cost and notify if prefer to switch or next visit - We will try to get samples in future for medicare donut hole for her - CONTINUE Metformin XR to 3 pills for 1500mg  DAILY in AM - advised she has option if decides to increase up to 4 pills in AM or 2 BID for total daily dose of 2000mg  of XR - may defer to next visit if prefer 2. Encourage improved lifestyle - low carb, low sugar diet, reduce portion  size, continue improving regular exercise 3. Check CBG, bring log to next visit for review 4. Continue ASA, ARB, Statin 5. Follow-up 3 months Annual phys and lab A1c      Hypothyroidism, postop    Improved hypothyroid control Last labs TSH and Free T4 are normal range Continues Levothyroxine 125mcg daily Re-check in 3 months with physical labs then likely q 6 mo         No orders of the defined types were placed in this encounter.  Follow up plan: Return in about 3 months (around 12/29/2017) for Annual Physical.  Future labs ordered for 12/30/17 - then will anticipate spacing out labs to q 6 months in future if improved.  Nobie Putnam, [facility] 09/28/2017, 12:26 PM

## 2017-09-28 NOTE — Assessment & Plan Note (Signed)
Still controlled DM slightly elevated A1c up from 7 to 7.3 Complications - CKD-III Prior meds: Januvia, insulin  Plan:  1. Continue Bydureon BCise 2mg  weekly [place] inj - we discussed alternative GLP1 option such as Ozempic for possibly more effective A1c control and better wt management - will check cost and notify if prefer to switch or next visit - We will try to get samples in future for medicare donut hole for her - CONTINUE Metformin XR to 3 pills for 1500mg  DAILY in AM - advised she has option if decides to increase up to 4 pills in AM or 2 BID for total daily dose of 2000mg  of XR - may defer to next visit if prefer 2. Encourage improved lifestyle - low carb, low sugar diet, reduce portion size, continue improving regular exercise 3. Check CBG, bring log to next visit for review 4. Continue ASA, ARB, Statin 5. Follow-up 3 months Annual phys and lab A1c

## 2017-09-28 NOTE — Assessment & Plan Note (Signed)
Improved Cr to 1, previously up to 1.15 Likely related to DM, HTN, Age  Continue on controlling chronic problem contributing Limit NSAIDs, and improve hydration Follow-up next Cr check 3 months with physical labs then likely q 6 months

## 2017-09-28 NOTE — Patient Instructions (Addendum)
Thank you for coming to the office today.  Keep up the great work - agree with your goals on diet - with avoiding late night eating and extra calories / keep reducing carb starches  No change to medicines is required today - but we can consider future adding 4th pill for Metformin XR 500mg  total daily dose would be 2000mg  - you can do 2 in AM and 2 in PM or 4 at once - let me know if you decide to do this.  Otherwise - continue Bydureon - ask me about samples next time we may have them arranged. For Medicare Donut Hole  Thyroid is controlled - continue current dose Levothyroxine 132mcg daily - in future we can check q 6 months  Consider Ozempic - it is slightly stronger for A1c and weight loss control - check cost.  Call insurance find cost and coverage of the following  1. Ozempic (Semaglutide injection) - start 0.25mg  weekly for 4 weeks then increase to 0.5mg  weekly - This one has best benefit of weight loss and reducing Cardiovascular events   DUE for FASTING BLOOD WORK (no food or drink after midnight before the lab appointment, only water or coffee without cream/sugar on the morning of)  SCHEDULE "Lab Only" visit in the morning at the clinic for lab draw in 3 MONTHS  (1st week of November)  - Make sure Lab Only appointment is at about 1 week before your next appointment, so that results will be available  For Lab Results, once available within 2-3 days of blood draw, you can can log in to MyChart online to view your results and a brief explanation. Also, we can discuss results at next follow-up visit.   Please schedule a Follow-up Appointment to: Return in about 3 months (around 12/29/2017) for Annual Physical.  If you have any other questions or concerns, please feel free to call the office or send a message through [place]. You may also schedule an earlier appointment if necessary.  Additionally, you may be receiving a survey about your experience at our office within a few days  to 1 week by e-mail or mail. We value your feedback.  Grace Putnam, DO [place]

## 2017-09-28 NOTE — Assessment & Plan Note (Signed)
Improved hypothyroid control Last labs TSH and Free T4 are normal range Continues Levothyroxine 156mcg daily Re-check in 3 months with physical labs then likely q 6 mo

## 2017-09-30 ENCOUNTER — Encounter: Payer: Self-pay | Admitting: Family Medicine

## 2017-09-30 DIAGNOSIS — E1121 Type 2 diabetes mellitus with diabetic nephropathy: Secondary | ICD-10-CM

## 2017-09-30 MED ORDER — INSULIN PEN NEEDLE 32G X 4 MM MISC: 3 refills | Status: DC

## 2017-09-30 MED ORDER — SEMAGLUTIDE(0.25 OR 0.5MG/DOS) 2 MG/1.5ML ~~LOC~~ SOPN: 0.5000 mg | PEN_INJECTOR | SUBCUTANEOUS | 2 refills | Status: DC

## 2017-10-04 ENCOUNTER — Other Ambulatory Visit: Payer: Self-pay | Admitting: Family Medicine

## 2017-10-04 DIAGNOSIS — F329 Major depressive disorder, single episode, unspecified: Secondary | ICD-10-CM

## 2017-10-04 DIAGNOSIS — F32A Depression, unspecified: Secondary | ICD-10-CM

## 2017-10-17 DIAGNOSIS — G4733 Obstructive sleep apnea (adult) (pediatric): Secondary | ICD-10-CM | POA: Diagnosis not present

## 2017-11-11 DIAGNOSIS — H40003 Preglaucoma, unspecified, bilateral: Secondary | ICD-10-CM | POA: Diagnosis not present

## 2017-11-17 DIAGNOSIS — G4733 Obstructive sleep apnea (adult) (pediatric): Secondary | ICD-10-CM | POA: Diagnosis not present

## 2017-11-24 DIAGNOSIS — H40003 Preglaucoma, unspecified, bilateral: Secondary | ICD-10-CM | POA: Diagnosis not present

## 2017-12-15 ENCOUNTER — Other Ambulatory Visit: Payer: Self-pay | Admitting: Family Medicine

## 2017-12-15 DIAGNOSIS — I10 Essential (primary) hypertension: Secondary | ICD-10-CM

## 2017-12-16 ENCOUNTER — Ambulatory Visit (INDEPENDENT_AMBULATORY_CARE_PROVIDER_SITE_OTHER): Payer: Medicare Other

## 2017-12-16 DIAGNOSIS — Z23 Encounter for immunization: Secondary | ICD-10-CM

## 2017-12-30 ENCOUNTER — Other Ambulatory Visit: Payer: Medicare Other

## 2017-12-30 DIAGNOSIS — E1169 Type 2 diabetes mellitus with other specified complication: Secondary | ICD-10-CM | POA: Diagnosis not present

## 2017-12-30 DIAGNOSIS — N183 Chronic kidney disease, stage 3 unspecified: Secondary | ICD-10-CM

## 2017-12-30 DIAGNOSIS — E785 Hyperlipidemia, unspecified: Secondary | ICD-10-CM | POA: Diagnosis not present

## 2017-12-30 DIAGNOSIS — E890 Postprocedural hypothyroidism: Secondary | ICD-10-CM

## 2017-12-30 DIAGNOSIS — E1121 Type 2 diabetes mellitus with diabetic nephropathy: Secondary | ICD-10-CM

## 2017-12-30 DIAGNOSIS — Z0000 Encounter for general adult medical examination without abnormal findings: Secondary | ICD-10-CM

## 2017-12-31 LAB — COMPLETE METABOLIC PANEL WITH GFR
AG RATIO: 1.7 (calc) (ref 1.0–2.5)
ALT: 9 U/L (ref 6–29)
AST: 12 U/L (ref 10–35)
Albumin: 4 g/dL (ref 3.6–5.1)
Alkaline phosphatase (APISO): 57 U/L (ref 33–130)
BILIRUBIN TOTAL: 0.4 mg/dL (ref 0.2–1.2)
BUN/Creatinine Ratio: 15 (calc) (ref 6–22)
BUN: 17 mg/dL (ref 7–25)
CHLORIDE: 103 mmol/L (ref 98–110)
CO2: 29 mmol/L (ref 20–32)
Calcium: 9.1 mg/dL (ref 8.6–10.4)
Creat: 1.11 mg/dL — ABNORMAL HIGH (ref 0.60–0.93)
GFR, EST AFRICAN AMERICAN: 57 mL/min/{1.73_m2} — AB (ref >=60)
GFR, Est Non African American: 50 mL/min/{1.73_m2} — ABNORMAL LOW (ref >=60)
GLUCOSE: 129 mg/dL — AB (ref 65–99)
Globulin: 2.4 g/dL (calc) (ref 1.9–3.7)
POTASSIUM: 5 mmol/L (ref 3.5–5.3)
Sodium: 139 mmol/L (ref 135–146)
TOTAL PROTEIN: 6.4 g/dL (ref 6.1–8.1)

## 2017-12-31 LAB — HEMOGLOBIN A1C
EAG (MMOL/L): 8.4 (calc)
HEMOGLOBIN A1C: 6.9 %{Hb} — AB (ref <5.7)
MEAN PLASMA GLUCOSE: 151 (calc)

## 2017-12-31 LAB — CBC WITH DIFFERENTIAL/PLATELET
BASOS ABS: 54 {cells}/uL (ref 0–200)
BASOS PCT: 0.7 %
EOS PCT: 5.2 %
Eosinophils Absolute: 400 cells/uL (ref 15–500)
HEMATOCRIT: 36 % (ref 35.0–45.0)
HEMOGLOBIN: 12.1 g/dL (ref 11.7–15.5)
LYMPHS ABS: 2225 {cells}/uL (ref 850–3900)
MCH: 30.4 pg (ref 27.0–33.0)
MCHC: 33.6 g/dL (ref 32.0–36.0)
MCV: 90.5 fL (ref 80.0–100.0)
MONOS PCT: 7.7 %
MPV: 10.2 fL (ref 7.5–12.5)
NEUTROS ABS: 4428 {cells}/uL (ref 1500–7800)
Neutrophils Relative %: 57.5 %
Platelets: 248 10*3/uL (ref 140–400)
RBC: 3.98 10*6/uL (ref 3.80–5.10)
RDW: 11.6 % (ref 11.0–15.0)
Total Lymphocyte: 28.9 %
WBC mixed population: 593 cells/uL (ref 200–950)
WBC: 7.7 10*3/uL (ref 3.8–10.8)

## 2017-12-31 LAB — LIPID PANEL
CHOL/HDL RATIO: 2.7 (calc) (ref <5.0)
Cholesterol: 145 mg/dL (ref <200)
HDL: 54 mg/dL (ref >50)
LDL Cholesterol (Calc): 71 mg/dL (calc)
Non-HDL Cholesterol (Calc): 91 mg/dL (calc) (ref <130)
TRIGLYCERIDES: 121 mg/dL (ref <150)

## 2017-12-31 LAB — TSH: TSH: 0.83 mIU/L (ref 0.40–4.50)

## 2017-12-31 LAB — T4, FREE: FREE T4: 1.5 ng/dL (ref 0.8–1.8)

## 2018-01-04 DIAGNOSIS — G4733 Obstructive sleep apnea (adult) (pediatric): Secondary | ICD-10-CM | POA: Diagnosis not present

## 2018-01-05 ENCOUNTER — Encounter: Payer: Self-pay | Admitting: Family Medicine

## 2018-01-05 ENCOUNTER — Ambulatory Visit (INDEPENDENT_AMBULATORY_CARE_PROVIDER_SITE_OTHER): Payer: Medicare Other | Admitting: Family Medicine

## 2018-01-05 VITALS — BP 119/47 | HR 53 | Temp 98.3°F | Resp 16 | Ht 66.0 in | Wt 237.0 lb

## 2018-01-05 DIAGNOSIS — H9313 Tinnitus, bilateral: Secondary | ICD-10-CM

## 2018-01-05 DIAGNOSIS — I10 Essential (primary) hypertension: Secondary | ICD-10-CM | POA: Diagnosis not present

## 2018-01-05 DIAGNOSIS — E1121 Type 2 diabetes mellitus with diabetic nephropathy: Secondary | ICD-10-CM

## 2018-01-05 DIAGNOSIS — Z0000 Encounter for general adult medical examination without abnormal findings: Secondary | ICD-10-CM | POA: Diagnosis not present

## 2018-01-05 DIAGNOSIS — N183 Chronic kidney disease, stage 3 unspecified: Secondary | ICD-10-CM

## 2018-01-05 DIAGNOSIS — E1169 Type 2 diabetes mellitus with other specified complication: Secondary | ICD-10-CM

## 2018-01-05 DIAGNOSIS — G4733 Obstructive sleep apnea (adult) (pediatric): Secondary | ICD-10-CM

## 2018-01-05 DIAGNOSIS — F3340 Major depressive disorder, recurrent, in remission, unspecified: Secondary | ICD-10-CM

## 2018-01-05 DIAGNOSIS — E785 Hyperlipidemia, unspecified: Secondary | ICD-10-CM

## 2018-01-05 DIAGNOSIS — Z9989 Dependence on other enabling machines and devices: Secondary | ICD-10-CM

## 2018-01-05 MED ORDER — SEMAGLUTIDE(0.25 OR 0.5MG/DOS) 2 MG/1.5ML ~~LOC~~ SOPN: 0.5000 mg | PEN_INJECTOR | SUBCUTANEOUS | 0 refills | Status: DC

## 2018-01-05 NOTE — Assessment & Plan Note (Signed)
Controlled HTN On minimal medication, coreg low dose Follow-up

## 2018-01-05 NOTE — Assessment & Plan Note (Signed)
Controlled cholesterol, improved TG on statin and diet/lifestyle  LDL goal < 70 per Cards Last lipid panel 11/19 ASCVD risk score is elevated due to known DM, CAD  Plan: 1. Continue current meds - Rosuvastatin 20mg  daily 2. Continue ASA 81mg  for secondary ASCVD risk reduction 3. Encourage improved lifestyle - low carb/cholesterol, reduce portion size, continue improving exercise 4. Follow-up yearly

## 2018-01-05 NOTE — Assessment & Plan Note (Signed)
Stable, controlled on SSRI PHQ 0 Follow-up as needed

## 2018-01-05 NOTE — Patient Instructions (Addendum)
Thank you for coming to the office today.  Keep up the great work overall!  Sample of [place] today - continue 0.5mg  weekly dose  Nausea likely from med.  Next we consider up to 1mg  dose in future  Talk with Dr Rockey Situ about the ear buzzing, ask about Carotid Doppler ultrasound testing - in future we may consider ENT.  Nerve in foot is likely peripheral nerve compression. Unlikely neuropathy  DUE for FASTING BLOOD WORK (no food or drink after midnight before the lab appointment, only water or coffee without cream/sugar on the morning of)  SCHEDULE "Lab Only" visit in the morning at the clinic for lab draw in 6 MONTHS   - Make sure Lab Only appointment is at about 1 week before your next appointment, so that results will be available  For Lab Results, once available within 2-3 days of blood draw, you can can log in to MyChart online to view your results and a brief explanation. Also, we can discuss results at next follow-up visit.   Please schedule a Follow-up Appointment to: Return in about 6 months (around 07/06/2018) for 6 month follow-up lab results, DM med adjust, Thyroid, CKD, HTN.  If you have any other questions or concerns, please feel free to call the office or send a message through [place]. You may also schedule an earlier appointment if necessary.  Additionally, you may be receiving a survey about your experience at our office within a few days to 1 week by e-mail or mail. We value your feedback.  Nobie Putnam, DO [place]

## 2018-01-05 NOTE — Progress Notes (Signed)
Subjective:    Patient ID: [name], female    DOB: September 17, 1945, 71 y.o.   MRN: [id number]  [name] is a 72 y.o. female presenting on 01/05/2018 for Annual Exam   HPI   Here for Annual Physical and Lab Review  CHRONIC DM, Type 2 with CKD-III Last lab A1c 6.9 CBGs: unchanged mostly Avg100-120,no low.High < 150. Checks CBGs x1 fasting AM - interval update, discontinued Bydureon and was switched to Ozempic Meds: - Metformin XR 1500mg  daily x 3 in AM - Ozempic 0.5mg  weekly inj - admits slight nausea earlier on now it is improving seems 1-2x a week, reduced some appetite, she is working on some intermittent fasting, if at all usually in evening, worse when first started, no big difference at 0.5 dose, doing well Reports good compliance, tolerating well Currently on ARB, ASA 81, Statin Lifestyle: - Weight down - Diet (improving diet) - Exercise (improved regular exercise) Denies hypoglycemia  FOLLOW-UPHYPOTHYROIDISM, S/p surgical thyroidectomy Last lab 12/2017, normal TSH Free T4 - Today patient report no symptoms - She usually does not notice problems if thyroid is off - On Levothyroxine 110mcg daily  CKD-III Last lab 12/2017 stable Cr 1.11 Tries to stay hydrated. No new concerns.  OSA, on CPAP - Patient reports prior history of dx OSA and on CPAP for >9years - Today reports that sleep apnea is well controlled.  uses the CPAP machine every night. Tolerates the machine well, and thinks that sleeps better with it and feels good. No new concerns or symptoms.  HLD Followed by Cards Labs controlled lipids On Rosuvastatin 20mg  Tolerating well  Depression, mild chronic recurrent - In remission See PHQ score No new concerns. Continues on SSRI. Currently doing well.  Follow-up Tinnitus Last discussed 05/2017 with "buzzing" sound in ears. Tried Flonase for sinuses, limited relief. She plans to discuss with Cardiology. Not ready for further testing  yet.  Seborrheic Keratosis, R cheek Reports concern with change to lesion on R cheek, states has had raised spot for a while, has accidentally scratched it a few times, some bleeding, but it does not seem to be scabbing or draining. Has not been to [facility]. Her husband sees [facility]) and she will go to them as well.  Health Maintenance: UTD Flu Vaccine 12/16/17  Depression screen [facility] 2/9 01/05/2018 06/23/2017 12/26/2016  Decreased Interest 0 2 0  Down, Depressed, Hopeless 0 2 0  PHQ - 2 Score 0 4 0  Altered sleeping 0 1 0  Tired, decreased energy 0 1 0  Change in appetite 0 0 0  Feeling bad or failure about yourself  0 0 0  Trouble concentrating 0 0 0  Moving slowly or fidgety/restless 0 0 0  Suicidal thoughts 0 0 0  PHQ-9 Score 0 6 0  Difficult doing work/chores Not difficult at all Not difficult at all Not difficult at all    Past Medical History:  Diagnosis Date  . Arthritis    knees   . Coronary artery disease    a. 07/2013 STEMI/PCI ([place]): LM nl, LAD 100p (thrombectomy & 3.5x23 Alpine DES), LCX nl, RCA nl, EF 45%.  . Endometrial cancer ([place])    THYROID CANCER  . Gravida 3 para 3   . Hyperlipidemia   . Hypertension   . Hypertensive heart disease   . Hypothyroid   . Ischemic cardiomyopathy    a. 07/2013 EF 45% @ time of STEMI; b. 11/2013 Echo: EF 55-60%, mildly dil LA, nl  RV fxn.  . Menopause   . Myocardial infarction ([place])    2016  . Obstructive sleep apnea on CPAP   . Osteoarthritis    Past Surgical History:  Procedure Laterality Date  . ABDOMINAL HYSTERECTOMY    . CARDIAC CATHETERIZATION    . CATARACT EXTRACTION  08/30/2016  . CATARACT EXTRACTION W/PHACO Left 08/30/2016   Procedure: CATARACT EXTRACTION PHACO AND INTRAOCULAR LENS PLACEMENT ([place]) Left daibetic;  Surgeon: Leandrew Koyanagi, MD;  Location: [facility];  Service: Ophthalmology;  Laterality: Left;  Diabetic - insulin and oral meds sleep apnea  . CATARACT EXTRACTION W/PHACO Right  10/07/2016   Procedure: CATARACT EXTRACTION PHACO AND INTRAOCULAR LENS PLACEMENT ([facility]);  Surgeon: Leandrew Koyanagi, MD;  Location: [facility];  Service: Ophthalmology;  Laterality: Right;  Lot# [id number] H [place]: 00:43.3 AP%: 12.2 CDE: 5.28  . CORONARY ANGIOPLASTY     drug eluting stent placed at [place]    . LAPAROSCOPIC HYSTERECTOMY    . THYROIDECTOMY     Social History   Socioeconomic History  . Marital status: Married    Spouse name: Not on file  . Number of children: Not on file  . Years of education: Not on file  . Highest education level: Not on file  Occupational History  . Not on file  Social Needs  . Financial resource strain: Not on file  . Food insecurity:    Worry: Not on file    Inability: Not on file  . Transportation needs:    Medical: Not on file    Non-medical: Not on file  Tobacco Use  . Smoking status: Never Smoker  . Smokeless tobacco: Never Used  Substance and Sexual Activity  . Alcohol use: No  . Drug use: No  . Sexual activity: Not on file  Lifestyle  . Physical activity:    Days per week: Not on file    Minutes per session: Not on file  . Stress: Not on file  Relationships  . Social connections:    Talks on phone: Not on file    Gets together: Not on file    Attends religious service: Not on file    Active member of club or organization: Not on file    Attends meetings of clubs or organizations: Not on file    Relationship status: Not on file  . Intimate partner violence:    Fear of current or ex partner: Not on file    Emotionally abused: Not on file    Physically abused: Not on file    Forced sexual activity: Not on file  Other Topics Concern  . Not on file  Social History Narrative  . Not on file   Family History  Problem Relation Age of Onset  . Heart attack Father   . Heart failure Father   . Hypertension Father   . Hyperlipidemia Father   . Heart disease Father 33       CABG   .  Pulmonary fibrosis Son   . Breast cancer Neg Hx    Current Outpatient Medications on File Prior to Visit  Medication Sig  . ACCU-CHEK AVIVA PLUS test strip U UTD BID  . acetaminophen (TYLENOL) 500 MG tablet Take 2 tablets by mouth 3 (three) times daily as needed.  Marland Kitchen amLODipine (NORVASC) 2.5 MG tablet Take 1 tablet (2.5 mg total) by mouth daily.  Marland Kitchen aspirin 81 MG tablet Take 81 mg by mouth daily.  . carvedilol (COREG)  3.125 MG tablet TAKE 1 TABLET BY MOUTH TWO  TIMES DAILY  . FLUoxetine (PROZAC) 10 MG capsule TAKE 1 CAPSULE BY MOUTH  DAILY  . fluticasone (FLONASE) 50 MCG/ACT nasal spray Place 2 sprays into both nostrils daily.  . Insulin Pen Needle (NOVOFINE PLUS) 32G X 4 MM MISC Use with Ozempic weekly injection as instructed  . levothyroxine (SYNTHROID, LEVOTHROID) 125 MCG tablet Take 1 tablet (125 mcg total) by mouth daily before breakfast.  . losartan (COZAAR) 100 MG tablet TAKE 1 TABLET BY MOUTH  DAILY  . metFORMIN (GLUCOPHAGE-XR) 500 MG 24 hr tablet Take 3 tablets (1,500 mg total) by mouth daily with breakfast.  . nitroGLYCERIN (NITROSTAT) 0.4 MG SL tablet Place 1 tablet (0.4 mg total) under the tongue every 5 (five) minutes as needed for chest pain.  . rosuvastatin (CRESTOR) 20 MG tablet TAKE 1 TABLET BY MOUTH  DAILY  . Semaglutide (OZEMPIC) 0.25 or 0.5 MG/DOSE SOPN Inject 0.5 mg into the skin once a week. For first 4 weeks, inject dose 0.25mg  weekly  . Zoster Vaccine Adjuvanted [facility]) injection Inject vaccine 0.5 mL once and repeat dose within 6 months   No current facility-administered medications on file prior to visit.     Review of Systems Per HPI unless specifically indicated above      Objective:    BP (!) 119/47   Pulse (!) 53   Temp 98.3 F (36.8 C) (Oral)   Resp 16   Ht 5\' 6"  (1.676 m)   Wt 237 lb (107.5 kg)   BMI 38.25 kg/m   Wt Readings from Last 3 Encounters:  01/05/18 237 lb (107.5 kg)  09/28/17 243 lb (110.2 kg)  06/23/17 239 lb (108.4 kg)     Physical Exam  Constitutional: She is oriented to person, place, and time. She appears well-developed and well-nourished. No distress.  Well-appearing, comfortable, cooperative, obese  HENT:  Head: Normocephalic and atraumatic.  Mouth/Throat: Oropharynx is clear and moist.  Eyes: Pupils are equal, round, and reactive to light. Conjunctivae and EOM are normal. Right eye exhibits no discharge. Left eye exhibits no discharge.  Neck: Normal range of motion. Neck supple. No thyromegaly present.  Bilateral carotids without bruit  Cardiovascular: Normal rate, regular rhythm, normal heart sounds and intact distal pulses.  No murmur heard. Pulmonary/Chest: Effort normal and breath sounds normal. No respiratory distress. She has no wheezes. She has no rales.  Abdominal: Soft. Bowel sounds are normal. She exhibits no distension and no mass. There is no tenderness.  Musculoskeletal: Normal range of motion. She exhibits no edema or tenderness.  Upper / Lower Extremities: - Normal muscle tone, strength bilateral upper extremities 5/5, lower extremities 5/5  Lymphadenopathy:    She has no cervical adenopathy.  Neurological: She is alert and oriented to person, place, and time.  Distal sensation intact to light touch all extremities  R foot dorsal non tender, no skin change, intact sensation, no reproduced symptoms today  Skin: Skin is warm and dry. No rash noted. She is not diaphoretic. No erythema.  R cheek - raised SK, no scab or ulceration, no color change.  Psychiatric: She has a normal mood and affect. Her behavior is normal.  Well groomed, good eye contact, normal speech and thoughts  Nursing note and vitals reviewed.    Diabetic Foot Exam - Simple   Simple Foot Form Diabetic Foot exam was performed with the following findings:  Yes 01/05/2018 10:32 AM  Visual Inspection No deformities, no ulcerations, no other  skin breakdown bilaterally:  Yes Sensation Testing Intact to touch and  monofilament testing bilaterally:  Yes Pulse Check Posterior Tibialis and Dorsalis pulse intact bilaterally:  Yes Comments     Results for orders placed or performed in visit on 12/30/17  T4, free  Result Value Ref Range   Free T4 1.5 0.8 - 1.8 ng/dL  TSH  Result Value Ref Range   TSH 0.83 0.40 - 4.50 mIU/L  Lipid panel  Result Value Ref Range   Cholesterol 145 <200 mg/dL   HDL 54 >50 mg/dL   Triglycerides 121 <150 mg/dL   LDL Cholesterol (Calc) 71 mg/dL (calc)   Total CHOL/HDL Ratio 2.7 <5.0 (calc)   Non-HDL Cholesterol (Calc) 91 <130 mg/dL (calc)  COMPLETE METABOLIC PANEL WITH GFR  Result Value Ref Range   Glucose, Bld 129 (H) 65 - 99 mg/dL   BUN 17 7 - 25 mg/dL   Creat 1.11 (H) 0.60 - 0.93 mg/dL   GFR, Est Non African American 50 (L) > OR = 60 mL/min/1.63m2   GFR, Est African American 57 (L) > OR = 60 mL/min/1.58m2   BUN/Creatinine Ratio 15 6 - 22 (calc)   Sodium 139 135 - 146 mmol/L   Potassium 5.0 3.5 - 5.3 mmol/L   Chloride 103 98 - 110 mmol/L   CO2 29 20 - 32 mmol/L   Calcium 9.1 8.6 - 10.4 mg/dL   Total Protein 6.4 6.1 - 8.1 g/dL   Albumin 4.0 3.6 - 5.1 g/dL   Globulin 2.4 1.9 - 3.7 g/dL (calc)   AG Ratio 1.7 1.0 - 2.5 (calc)   Total Bilirubin 0.4 0.2 - 1.2 mg/dL   Alkaline phosphatase (APISO) 57 33 - 130 U/L   AST 12 10 - 35 U/L   ALT 9 6 - 29 U/L  CBC with Differential/Platelet  Result Value Ref Range   WBC 7.7 3.8 - 10.8 Thousand/uL   RBC 3.98 3.80 - 5.10 Million/uL   Hemoglobin 12.1 11.7 - 15.5 g/dL   HCT 36.0 35.0 - 45.0 %   MCV 90.5 80.0 - 100.0 fL   MCH 30.4 27.0 - 33.0 pg   MCHC 33.6 32.0 - 36.0 g/dL   RDW 11.6 11.0 - 15.0 %   Platelets 248 140 - 400 Thousand/uL   MPV 10.2 7.5 - 12.5 fL   Neutro Abs 4,428 1,500 - 7,800 cells/uL   Lymphs Abs 2,225 850 - 3,900 cells/uL   WBC mixed population 593 200 - 950 cells/uL   Eosinophils Absolute 400 15 - 500 cells/uL   Basophils Absolute 54 0 - 200 cells/uL   Neutrophils Relative % 57.5 %   Total  Lymphocyte 28.9 %   Monocytes Relative 7.7 %   Eosinophils Relative 5.2 %   Basophils Relative 0.7 %  Hemoglobin A1c  Result Value Ref Range   Hgb A1c MFr Bld 6.9 (H) <5.7 % of total Hgb   Mean Plasma Glucose 151 (calc)   eAG (mmol/L) 8.4 (calc)      Assessment & Plan:   Problem List Items Addressed This Visit    CKD (chronic kidney disease), stage III (HCC)    Stable Cr 1.11 Likely related to DM, HTN, Age  Continue on controlling chronic problem contributing Limit NSAIDs, and improve hydration Follow-up q 51mo      Controlled type 2 diabetes mellitus with diabetic nephropathy (HCC)    Well controlled A1c down to 6.9, improved Complications - CKD-III Prior meds: Januvia, insulin  Plan:  1. CONTINUE  Ozempic 0.5mg  weekly inj - sample given today due to medicare donut hole cost, given 1 pen for 1 month supply - CONTINUE Metformin XR to 3 pills for 1500mg  DAILY in AM 2. Encourage improved lifestyle - low carb, low sugar diet, reduce portion size, continue improving regular exercise 3. Check CBG, bring log to next visit for review 4. Continue ASA, ARB, Statin DM Foot exam done 5. Follow-up 6 months A1c  Future consider inc Ozempic from 0.5 to 1mg  - caution w/ side effect      Ear noise/buzzing, bilateral    Limited improvement on sinus therapy w/ flonase Exam unremarkable Still unable to appreciate any carotid bruit Advised her to discuss w/ Cardiology - consider carotid dopplers, then if needed can see ENT      Essential (primary) hypertension    Controlled HTN On minimal medication, coreg low dose Follow-up      Hyperlipidemia associated with type 2 diabetes mellitus ([place])    Controlled cholesterol, improved TG on statin and diet/lifestyle  LDL goal < 70 per Cards Last lipid panel 11/19 ASCVD risk score is elevated due to known DM, CAD  Plan: 1. Continue current meds - Rosuvastatin 20mg  daily 2. Continue ASA 81mg  for secondary ASCVD risk reduction 3.  Encourage improved lifestyle - low carb/cholesterol, reduce portion size, continue improving exercise 4. Follow-up yearly      Major depressive disorder, recurrent, in remission ([place])    Stable, controlled on SSRI PHQ 0 Follow-up as needed      Obstructive sleep apnea on CPAP    Well controlled, chronic OSA on CPAP, now >9 years - Last PSG [facility] >9 yr ago - Good adherence to CPAP nightly - Continue current CPAP therapy, patient seems to be benefiting from therapy       Other Visit Diagnoses    Annual physical exam    -  Primary      Updated Health Maintenance information Reviewed recent lab results with patient Encouraged improvement to lifestyle with diet and exercise - Goal of weight loss  Meds ordered this encounter  Medications  . DISCONTD: Semaglutide,0.25 or 0.5MG /DOS, (OZEMPIC, 0.25 OR 0.5 MG/DOSE,) 2 MG/1.5ML SOPN    Sig: Inject 0.5 mg into the skin once a week.    Dispense:  1 pen    Refill:  0    Follow up plan: Return in about 6 months (around 07/06/2018) for 6 month follow-up lab results, DM med adjust, Thyroid, CKD, HTN.  Nobie Putnam, [facility] 01/05/2018, 10:33 AM

## 2018-01-05 NOTE — Assessment & Plan Note (Signed)
Well controlled, chronic OSA on CPAP, now >9 years - Last PSG [facility] >9 yr ago - Good adherence to CPAP nightly - Continue current CPAP therapy, patient seems to be benefiting from therapy

## 2018-01-05 NOTE — Assessment & Plan Note (Addendum)
Limited improvement on sinus therapy w/ flonase Exam unremarkable Still unable to appreciate any carotid bruit Advised her to discuss w/ Cardiology - consider carotid dopplers, then if needed can see ENT

## 2018-01-05 NOTE — Assessment & Plan Note (Signed)
Stable Cr 1.11 Likely related to DM, HTN, Age  Continue on controlling chronic problem contributing Limit NSAIDs, and improve hydration Follow-up q 5mo

## 2018-01-05 NOTE — Assessment & Plan Note (Addendum)
Well controlled A1c down to 6.9, improved Complications - CKD-III Prior meds: Januvia, insulin  Plan:  1. CONTINUE Ozempic 0.5mg  weekly inj - sample given today due to medicare donut hole cost, given 1 pen for 1 month supply - CONTINUE Metformin XR to 3 pills for 1500mg  DAILY in AM 2. Encourage improved lifestyle - low carb, low sugar diet, reduce portion size, continue improving regular exercise 3. Check CBG, bring log to next visit for review 4. Continue ASA, ARB, Statin DM Foot exam done 5. Follow-up 6 months A1c  Future consider inc Ozempic from 0.5 to 1mg  - caution w/ side effect

## 2018-01-09 ENCOUNTER — Other Ambulatory Visit: Payer: Self-pay | Admitting: Family Medicine

## 2018-01-09 DIAGNOSIS — E1121 Type 2 diabetes mellitus with diabetic nephropathy: Secondary | ICD-10-CM

## 2018-01-22 NOTE — Progress Notes (Signed)
Cardiology Office Note  Date:  01/23/2018   ID:  [name], [name] 07/19/1945, MRN [id number]  PCP:  Olin Hauser, DO   Chief Complaint  Patient presents with  . other    12 mo  follow up.Medications reviewed verbally.     HPI:  [name] is a pleasant 72 year old woman with past medical history of CAD June 2015, anterior ST elevation MI in [place]  thrombectomy and drug-eluting stent placement to the LAD.  EF 45%  echocardiogram in October 2015 showed normalization of LV function, at 55-60%.  hypertension, hyperlipidemia,  diabetes morbid obesity son passed in December 2016. She presents for follow-up of her coronary artery disease  Reports feeling well, no chest pain or shortness of breath concern for angina  Trying to work on her weight, low carbohydrates She attributes better numbers to recent diabetes medication changes  Lab work reviewed with her in detail HBAic 7.3 to 6.9 Total chol 150, LDL 71  EKG personally reviewed by myself on todays visit Shows normal sinus rhythm rate 63 bpm no significant ST or T wave changes   PMH:   has a past medical history of Arthritis, Coronary artery disease, Endometrial cancer ([place]), Gravida 3 para 3, Hyperlipidemia, Hypertension, Hypertensive heart disease, Hypothyroid, Ischemic cardiomyopathy, Menopause, Myocardial infarction ([place]), Obstructive sleep apnea on CPAP, and Osteoarthritis.  PSH:    Past Surgical History:  Procedure Laterality Date  . ABDOMINAL HYSTERECTOMY    . CARDIAC CATHETERIZATION    . CATARACT EXTRACTION  08/30/2016  . CATARACT EXTRACTION W/PHACO Left 08/30/2016   Procedure: CATARACT EXTRACTION PHACO AND INTRAOCULAR LENS PLACEMENT ([place]) Left daibetic;  Surgeon: Leandrew Koyanagi, MD;  Location: [facility];  Service: Ophthalmology;  Laterality: Left;  Diabetic - insulin and oral meds sleep apnea  . CATARACT EXTRACTION W/PHACO Right 10/07/2016   Procedure: CATARACT  EXTRACTION PHACO AND INTRAOCULAR LENS PLACEMENT ([facility]);  Surgeon: Leandrew Koyanagi, MD;  Location: [facility];  Service: Ophthalmology;  Laterality: Right;  Lot# [id number] H [place]: 00:43.3 AP%: 12.2 CDE: 5.28  . CORONARY ANGIOPLASTY     drug eluting stent placed at [place]    . LAPAROSCOPIC HYSTERECTOMY    . THYROIDECTOMY      Current Outpatient Medications  Medication Sig Dispense Refill  . ACCU-CHEK AVIVA PLUS test strip U UTD BID 100 each 12  . acetaminophen (TYLENOL) 500 MG tablet Take 2 tablets by mouth 3 (three) times daily as needed.    Marland Kitchen amLODipine (NORVASC) 2.5 MG tablet Take 1 tablet (2.5 mg total) by mouth daily. 90 tablet 3  . aspirin 81 MG tablet Take 81 mg by mouth daily.    . carvedilol (COREG) 3.125 MG tablet TAKE 1 TABLET BY MOUTH TWO  TIMES DAILY 180 tablet 3  . FLUoxetine (PROZAC) 10 MG capsule TAKE 1 CAPSULE BY MOUTH  DAILY 90 capsule 1  . fluticasone (FLONASE) 50 MCG/ACT nasal spray Place 2 sprays into both nostrils daily.    . Insulin Pen Needle (NOVOFINE PLUS) 32G X 4 MM MISC Use with Ozempic weekly injection as instructed 30 each 3  . levothyroxine (SYNTHROID, LEVOTHROID) 125 MCG tablet Take 1 tablet (125 mcg total) by mouth daily before breakfast. 90 tablet 3  . losartan (COZAAR) 100 MG tablet TAKE 1 TABLET BY MOUTH  DAILY 90 tablet 3  . metFORMIN (GLUCOPHAGE-XR) 500 MG 24 hr tablet TAKE 3 TABLETS BY MOUTH  DAILY WITH BREAKFAST 270 tablet 3  . nitroGLYCERIN (  NITROSTAT) 0.4 MG SL tablet Place 1 tablet (0.4 mg total) under the tongue every 5 (five) minutes as needed for chest pain. 30 tablet 11  . rosuvastatin (CRESTOR) 20 MG tablet TAKE 1 TABLET BY MOUTH  DAILY 90 tablet 3  . Semaglutide (OZEMPIC) 0.25 or 0.5 MG/DOSE SOPN Inject 0.5 mg into the skin once a week. For first 4 weeks, inject dose 0.25mg  weekly 1 pen 2  . Zoster Vaccine Adjuvanted [facility]) injection Inject vaccine 0.5 mL once and repeat dose within 6 months (Patient  not taking: Reported on 01/23/2018) 0.5 mL 1   No current facility-administered medications for this visit.      Allergies:   Patient has no known allergies.   Social History:  The patient  reports that she has never smoked. She has never used smokeless tobacco. She reports that she does not drink alcohol or use drugs.   Family History:   family history includes Heart attack in her father; Heart disease (age of onset: 31) in her father; Heart failure in her father; Hyperlipidemia in her father; Hypertension in her father; Pulmonary fibrosis in her son.    Review of Systems: Review of Systems  Constitutional: Negative.   Respiratory: Negative.   Cardiovascular: Negative.   Gastrointestinal: Negative.   Musculoskeletal: Negative.   Neurological: Negative.   Psychiatric/Behavioral: Negative.   All other systems reviewed and are negative.    PHYSICAL EXAM: VS:  BP (!) 144/82 (BP Location: Left Arm, Patient Position: Sitting, Cuff Size: Normal)   Pulse 63   Ht 5\' 7"  (1.702 m)   Wt 238 lb (108 kg)   BMI 37.28 kg/m  , BMI Body mass index is 37.28 kg/m. Constitutional:  oriented to person, place, and time. No distress.  HENT:  Head: Grossly normal Eyes:  no discharge. No scleral icterus.  Neck: No JVD, no carotid bruits  Cardiovascular: Regular rate and rhythm, no murmurs appreciated Pulmonary/Chest: Clear to auscultation bilaterally, no wheezes or rails Abdominal: Soft.  no distension.  no tenderness.  Musculoskeletal: Normal range of motion Neurological:  normal muscle tone. Coordination normal. No atrophy Skin: Skin warm and dry Psychiatric: normal affect, pleasant  Recent Labs: 12/30/2017: ALT 9; BUN 17; Creat 1.11; Hemoglobin 12.1; Platelets 248; Potassium 5.0; Sodium 139; TSH 0.83    Lipid Panel Lab Results  Component Value Date   CHOL 145 12/30/2017   HDL 54 12/30/2017   LDLCALC 71 12/30/2017   TRIG 121 12/30/2017      Wt Readings from Last 3 Encounters:   01/23/18 238 lb (108 kg)  01/05/18 237 lb (107.5 kg)  09/28/17 243 lb (110.2 kg)       ASSESSMENT AND PLAN:  Coronary artery disease of native artery of native heart with stable angina pectoris ([place]) - Plan: EKG 12-Lead Denies any anginal symptoms Recommend reports for lower LDL, goal 60 or less  Essential (primary) hypertension - Plan: EKG 12-Lead Blood pressure is well controlled on today's visit. No changes made to the medications.  Mixed hyperlipidemia - Plan: EKG 12-Lead Recommend we add Zetia 10 mg daily with her Crestor  Type 2 diabetes mellitus with other circulatory complication, with long-term current use of insulin (HCC) - Plan: EKG 12-Lead Stressed importance of low carbohydrate diet, continued exercise program Recent medication changes, improved numbers  Chronic kidney disease (CKD) stage G3a/A1,  Stable renal function Improved diabetes numbers  Obstructive sleep apnea on CPAP Compliant with her cpap  Morbid obesity (HCC) Weight slowly improving, improved diabetes numbers  Ischemic cardiomyopathy - Plan: EKG 12-Lead Euvolemic, no further ischemic work-up needed at this time    Total encounter time more than 25 minutes  Greater than 50% was spent in counseling and coordination of care with the patient ,  Disposition:   F/U  12 months   Orders Placed This Encounter  Procedures  . EKG 12-Lead     Signed, Esmond Plants, M.D., Ph.D. 01/23/2018  [place], [place]

## 2018-01-23 ENCOUNTER — Ambulatory Visit: Payer: Medicare Other | Admitting: Cardiovascular Disease

## 2018-01-23 ENCOUNTER — Encounter: Payer: Self-pay | Admitting: Cardiovascular Disease

## 2018-01-23 VITALS — BP 144/82 | HR 63 | Ht 67.0 in | Wt 238.0 lb

## 2018-01-23 DIAGNOSIS — I25118 Atherosclerotic heart disease of native coronary artery with other forms of angina pectoris: Secondary | ICD-10-CM | POA: Diagnosis not present

## 2018-01-23 DIAGNOSIS — Z794 Long term (current) use of insulin: Secondary | ICD-10-CM

## 2018-01-23 DIAGNOSIS — I255 Ischemic cardiomyopathy: Secondary | ICD-10-CM | POA: Diagnosis not present

## 2018-01-23 DIAGNOSIS — N183 Chronic kidney disease, stage 3 (moderate): Secondary | ICD-10-CM

## 2018-01-23 DIAGNOSIS — E669 Obesity, unspecified: Secondary | ICD-10-CM | POA: Insufficient documentation

## 2018-01-23 DIAGNOSIS — E1169 Type 2 diabetes mellitus with other specified complication: Secondary | ICD-10-CM | POA: Diagnosis not present

## 2018-01-23 DIAGNOSIS — E1159 Type 2 diabetes mellitus with other circulatory complications: Secondary | ICD-10-CM

## 2018-01-23 DIAGNOSIS — E785 Hyperlipidemia, unspecified: Secondary | ICD-10-CM

## 2018-01-23 DIAGNOSIS — N1831 Chronic kidney disease, stage 3a: Secondary | ICD-10-CM

## 2018-01-23 DIAGNOSIS — Z6841 Body Mass Index (BMI) 40.0 and over, adult: Secondary | ICD-10-CM

## 2018-01-23 DIAGNOSIS — E66811 Obesity, class 1: Secondary | ICD-10-CM | POA: Insufficient documentation

## 2018-01-23 MED ORDER — EZETIMIBE 10 MG PO TABS: 10.0000 mg | ORAL_TABLET | Freq: Every day | ORAL | 3 refills | Status: DC

## 2018-01-23 NOTE — Patient Instructions (Signed)
Medication Instructions:   Please start zetia one a day  If you need a refill on your cardiac medications before your next appointment, please call your pharmacy.    Lab work: No new labs needed   If you have labs (blood work) drawn today and your tests are completely normal, you will receive your results only by: Marland Kitchen MyChart Message (if you have MyChart) OR . A paper copy in the mail If you have any lab test that is abnormal or we need to change your treatment, we will call you to review the results.   Testing/Procedures: No new testing needed   Follow-Up: At [facility], you and your health needs are our priority.  As part of our continuing mission to provide you with exceptional heart care, we have created designated Provider Care Teams.  These Care Teams include your primary Cardiologist (physician) and Advanced Practice Providers (APPs -  Physician Assistants and Nurse Practitioners) who all work together to provide you with the care you need, when you need it.  . You will need a follow up appointment in 12 months .   Please call our office 2 months in advance to schedule this appointment.    . Providers on your designated Care Team:   . Murray Hodgkins, NP . Christell Faith, PA-C . Marrianne Mood, PA-C  Any Other Special Instructions Will Be Listed Below (If Applicable).  For educational health videos Log in to : [email or web address] Or : [email or web address], password : [email or web address]

## 2018-02-03 DIAGNOSIS — G4733 Obstructive sleep apnea (adult) (pediatric): Secondary | ICD-10-CM | POA: Diagnosis not present

## 2018-02-20 NOTE — Telephone Encounter (Signed)
Will request that we reserve 1 ozempic pen sample for patient, she will come by today 12/23 or tomorrow tues 12/24 - will need to get out of fridge and sign out.  Nobie Putnam, DO [facility] 02/20/2018, 12:34 PM

## 2018-03-02 ENCOUNTER — Other Ambulatory Visit: Payer: Self-pay | Admitting: Family Medicine

## 2018-03-02 DIAGNOSIS — I10 Essential (primary) hypertension: Secondary | ICD-10-CM

## 2018-03-02 DIAGNOSIS — E890 Postprocedural hypothyroidism: Secondary | ICD-10-CM

## 2018-03-02 DIAGNOSIS — F32A Depression, unspecified: Secondary | ICD-10-CM

## 2018-03-02 DIAGNOSIS — E1121 Type 2 diabetes mellitus with diabetic nephropathy: Secondary | ICD-10-CM

## 2018-03-02 DIAGNOSIS — F329 Major depressive disorder, single episode, unspecified: Secondary | ICD-10-CM

## 2018-03-06 DIAGNOSIS — G4733 Obstructive sleep apnea (adult) (pediatric): Secondary | ICD-10-CM | POA: Diagnosis not present

## 2018-04-06 DIAGNOSIS — G4733 Obstructive sleep apnea (adult) (pediatric): Secondary | ICD-10-CM | POA: Diagnosis not present

## 2018-04-24 ENCOUNTER — Telehealth: Payer: Self-pay | Admitting: Family Medicine

## 2018-04-24 NOTE — Telephone Encounter (Signed)
Called to schedule Medicare Annual Wellness Visit with the Nurse Health Advisor. Patient was currently at another appointment at the time of this call.  Patient states she will call back and schedule.  If patient returns call, please note: their last AWV was on 11/30/2016, please schedule AWV with [facility] any date AFTER 11/30/2017.  Thank you! For any questions please contact: Janace Hoard at [phone] or Skype [email or web address]

## 2018-05-05 DIAGNOSIS — G4733 Obstructive sleep apnea (adult) (pediatric): Secondary | ICD-10-CM | POA: Diagnosis not present

## 2018-05-26 ENCOUNTER — Other Ambulatory Visit: Payer: Self-pay | Admitting: Family Medicine

## 2018-05-26 DIAGNOSIS — E785 Hyperlipidemia, unspecified: Principal | ICD-10-CM

## 2018-05-26 DIAGNOSIS — E1169 Type 2 diabetes mellitus with other specified complication: Secondary | ICD-10-CM

## 2018-06-05 DIAGNOSIS — G4733 Obstructive sleep apnea (adult) (pediatric): Secondary | ICD-10-CM | POA: Diagnosis not present

## 2018-06-25 ENCOUNTER — Other Ambulatory Visit: Payer: Self-pay | Admitting: Family Medicine

## 2018-06-25 DIAGNOSIS — N1831 Chronic kidney disease, stage 3a: Secondary | ICD-10-CM

## 2018-06-25 DIAGNOSIS — E1121 Type 2 diabetes mellitus with diabetic nephropathy: Secondary | ICD-10-CM

## 2018-06-25 DIAGNOSIS — I10 Essential (primary) hypertension: Secondary | ICD-10-CM

## 2018-06-25 DIAGNOSIS — N183 Chronic kidney disease, stage 3 (moderate): Principal | ICD-10-CM

## 2018-06-25 DIAGNOSIS — E890 Postprocedural hypothyroidism: Secondary | ICD-10-CM

## 2018-06-30 ENCOUNTER — Other Ambulatory Visit: Payer: Medicare Other

## 2018-06-30 ENCOUNTER — Other Ambulatory Visit: Payer: Self-pay

## 2018-06-30 DIAGNOSIS — I10 Essential (primary) hypertension: Secondary | ICD-10-CM | POA: Diagnosis not present

## 2018-06-30 DIAGNOSIS — N1831 Chronic kidney disease, stage 3a: Secondary | ICD-10-CM

## 2018-06-30 DIAGNOSIS — E1121 Type 2 diabetes mellitus with diabetic nephropathy: Secondary | ICD-10-CM

## 2018-06-30 DIAGNOSIS — N183 Chronic kidney disease, stage 3 (moderate): Secondary | ICD-10-CM

## 2018-06-30 DIAGNOSIS — E890 Postprocedural hypothyroidism: Secondary | ICD-10-CM

## 2018-07-01 LAB — BASIC METABOLIC PANEL WITH GFR
BUN: 16 mg/dL (ref 7–25)
CO2: 28 mmol/L (ref 20–32)
Calcium: 9 mg/dL (ref 8.6–10.4)
Chloride: 104 mmol/L (ref 98–110)
Creat: 0.9 mg/dL (ref 0.60–0.93)
GFR, Est African American: 74 mL/min/{1.73_m2} (ref >=60)
GFR, Est Non African American: 63 mL/min/{1.73_m2} (ref >=60)
Glucose, Bld: 109 mg/dL — ABNORMAL HIGH (ref 65–99)
Potassium: 4.4 mmol/L (ref 3.5–5.3)
Sodium: 141 mmol/L (ref 135–146)

## 2018-07-01 LAB — HEMOGLOBIN A1C
Hgb A1c MFr Bld: 6.8 % of total Hgb — ABNORMAL HIGH (ref <5.7)
Mean Plasma Glucose: 148 (calc)
eAG (mmol/L): 8.2 (calc)

## 2018-07-01 LAB — TSH: TSH: 0.08 mIU/L — ABNORMAL LOW (ref 0.40–4.50)

## 2018-07-01 LAB — T4, FREE: Free T4: 1.7 ng/dL (ref 0.8–1.8)

## 2018-07-05 DIAGNOSIS — G4733 Obstructive sleep apnea (adult) (pediatric): Secondary | ICD-10-CM | POA: Diagnosis not present

## 2018-07-06 ENCOUNTER — Encounter: Payer: Self-pay | Admitting: Family Medicine

## 2018-07-06 ENCOUNTER — Other Ambulatory Visit: Payer: Self-pay | Admitting: Family Medicine

## 2018-07-06 ENCOUNTER — Ambulatory Visit (INDEPENDENT_AMBULATORY_CARE_PROVIDER_SITE_OTHER): Payer: Medicare Other | Admitting: Family Medicine

## 2018-07-06 ENCOUNTER — Other Ambulatory Visit: Payer: Self-pay

## 2018-07-06 VITALS — Ht 67.0 in | Wt 223.4 lb

## 2018-07-06 DIAGNOSIS — I129 Hypertensive chronic kidney disease with stage 1 through stage 4 chronic kidney disease, or unspecified chronic kidney disease: Secondary | ICD-10-CM

## 2018-07-06 DIAGNOSIS — E669 Obesity, unspecified: Secondary | ICD-10-CM

## 2018-07-06 DIAGNOSIS — E1169 Type 2 diabetes mellitus with other specified complication: Secondary | ICD-10-CM

## 2018-07-06 DIAGNOSIS — E890 Postprocedural hypothyroidism: Secondary | ICD-10-CM

## 2018-07-06 DIAGNOSIS — N182 Chronic kidney disease, stage 2 (mild): Secondary | ICD-10-CM

## 2018-07-06 DIAGNOSIS — E785 Hyperlipidemia, unspecified: Secondary | ICD-10-CM

## 2018-07-06 DIAGNOSIS — F3340 Major depressive disorder, recurrent, in remission, unspecified: Secondary | ICD-10-CM

## 2018-07-06 DIAGNOSIS — E1121 Type 2 diabetes mellitus with diabetic nephropathy: Secondary | ICD-10-CM | POA: Diagnosis not present

## 2018-07-06 DIAGNOSIS — Z0000 Encounter for general adult medical examination without abnormal findings: Secondary | ICD-10-CM

## 2018-07-06 NOTE — Patient Instructions (Addendum)
Keep up the great work overall!  No changes to medicine  I agree - Ozempic 0.5mg  is working well, let's not change it. Continue it for now - if you need financial assistance contact our office  If you can in the Fall or Winter please schedule your yearly annual diabetic eye exam.  DUE for FASTING BLOOD WORK (no food or drink after midnight before the lab appointment, only water or coffee without cream/sugar on the morning of)  SCHEDULE "Lab Only" visit in the morning at the clinic for lab draw in 6 MONTHS   - Make sure Lab Only appointment is at about 1 week before your next appointment, so that results will be available  For Lab Results, once available within 2-3 days of blood draw, you can can log in to MyChart online to view your results and a brief explanation. Also, we can discuss results at next follow-up visit.    Please schedule a Follow-up Appointment to: Return in about 6 months (around 01/06/2019) for Annual Physical.  If you have any other questions or concerns, please feel free to call the office or send a message through [place]. You may also schedule an earlier appointment if necessary.  Additionally, you may be receiving a survey about your experience at our office within a few days to 1 week by e-mail or mail. We value your feedback.  Nobie Putnam, DO [place]

## 2018-07-06 NOTE — Progress Notes (Signed)
Subjective:    Patient ID: [name], female    DOB: 1945-09-09, 73 y.o.   MRN: [id number]  [name] is a 73 y.o. female presenting on 07/06/2018 for Diabetes  Virtual / Telehealth Encounter - Video Visit via [email or web address] The purpose of this virtual visit is to provide medical care while limiting exposure to the novel coronavirus (COVID19) for both patient and office staff.  Consent was obtained for remote visit:  Yes.   Answered questions that patient had about telehealth interaction:  Yes.   I discussed the limitations, risks, security and privacy concerns of performing an evaluation and management service by video/telephone. I also discussed with the patient that there may be a patient responsible charge related to this service. The patient expressed understanding and agreed to proceed.  Patient Location: Home Provider Location: [facility] (Office)   HPI    CHRONIC DM, Type 2 with CKD-II / Hypertension Last lab improved to A1c 6.8. Also chemistry showed improved Creatinine with reduced GFR She is doing well overall still on Ozempic 0.5mg , has had good weight loss CBGs:stable mostlyAvg100-120,no low.High < 150. Checks CBGs x1 fasting AM Meds: - Metformin XR 1500mg  daily x 3 in AM - Ozempic 0.5mg  weekly inj - minimal nausea occasionally - improved Reports good compliance, tolerating well Currently on ARB, ASA 81, Statin Lifestyle: - Weight down, see result - Diet (improving diet) - Exercise (improved regular exercise) Denies hypoglycemia, numbness tingling weakness  FOLLOW-UPHYPOTHYROIDISM, S/p surgical thyroidectomy Last lab 06/2018 - TSH down to 0.08 (compared to prior 0.2 to 0.9 range in past), Free T4 is normal She reports doing well, with mild low energy at times, but good weight loss and no other concerns with thyroid - On Levothyroxine 154mcg daily  HLD Followed by Cards Last visit cardiology added Zetia to her statin, rosuvastatin  20mg  She request lab with lipids in 6 months   Depression screen [facility] 2/9 07/06/2018 01/05/2018 06/23/2017  Decreased Interest 0 0 2  Down, Depressed, Hopeless 0 0 2  PHQ - 2 Score 0 0 4  Altered sleeping 1 0 1  Tired, decreased energy 1 0 1  Change in appetite 0 0 0  Feeling bad or failure about yourself  0 0 0  Trouble concentrating 0 0 0  Moving slowly or fidgety/restless 0 0 0  Suicidal thoughts 0 0 0  PHQ-9 Score 2 0 6  Difficult doing work/chores Not difficult at all Not difficult at all Not difficult at all    Social History   Tobacco Use  . Smoking status: Never Smoker  . Smokeless tobacco: Never Used  Substance Use Topics  . Alcohol use: No  . Drug use: No    Review of Systems Per HPI unless specifically indicated above     Objective:    Ht 5\' 7"  (1.702 m)   Wt 223 lb 6.4 oz (101.3 kg)   BMI 34.99 kg/m   Wt Readings from Last 3 Encounters:  07/06/18 223 lb 6.4 oz (101.3 kg)  01/23/18 238 lb (108 kg)  01/05/18 237 lb (107.5 kg)    Physical Exam   Note examination was completely remotely via video observation objective data only  Gen - well-appearing, no acute distress or apparent pain, comfortable, obese with weight loss HEENT - eyes appear clear without discharge or redness Heart/Lungs - cannot examine virtually - observed no evidence of coughing or labored breathing. Skin - face visible today- no rash Neuro - awake, alert,  oriented Psych - not anxious appearing   Results for orders placed or performed in visit on 06/30/18  T4, free  Result Value Ref Range   Free T4 1.7 0.8 - 1.8 ng/dL  TSH  Result Value Ref Range   TSH 0.08 (L) 0.40 - 4.50 mIU/L  BASIC METABOLIC PANEL WITH GFR  Result Value Ref Range   Glucose, Bld 109 (H) 65 - 99 mg/dL   BUN 16 7 - 25 mg/dL   Creat 0.90 0.60 - 0.93 mg/dL   GFR, Est Non African American 63 > OR = 60 mL/min/1.74m2   GFR, Est African American 74 > OR = 60 mL/min/1.36m2   BUN/Creatinine Ratio NOT APPLICABLE 6 -  22 (calc)   Sodium 141 135 - 146 mmol/L   Potassium 4.4 3.5 - 5.3 mmol/L   Chloride 104 98 - 110 mmol/L   CO2 28 20 - 32 mmol/L   Calcium 9.0 8.6 - 10.4 mg/dL  Hemoglobin A1c  Result Value Ref Range   Hgb A1c MFr Bld 6.8 (H) <5.7 % of total Hgb   Mean Plasma Glucose 148 (calc)   eAG (mmol/L) 8.2 (calc)      Assessment & Plan:   Problem List Items Addressed This Visit    Benign hypertension with CKD (chronic kidney disease), stage II    Well-controlled HTN Complication with CKD-II, improved from CKD III    Plan:  1. Continue current BP regimen Losartan 100mg  daily, Carvedilol 3.125mg  BID, Amlodipine 2.5mg  2. Encourage improved lifestyle - low sodium diet, regular exercise 3. Continue monitor BP outside office, bring readings to next visit, if persistently >140/90 or new symptoms notify office sooner      CKD (chronic kidney disease), stage II    Improved Cr Secondary to DM HTN age Limit NSAID See A&P      Controlled type 2 diabetes mellitus with diabetic nephropathy ([place]) - Primary    Well controlled A1c down to 6.8, still improving, lifestyle and weight loss Complications - CKD-II, improved, Hyperlipidemia, Hypothyroidism Prior meds: Januvia, insulin  Plan:  1. CONTINUE Ozempic 0.5mg  weekly inj - no plan to increase to 1mg  at this time, history of some nausea, doing well currently. If need financial assistance can contact us, may consider samples through company or [place] for financial assistance - CONTINUE Metformin XR to 3 pills for 1500mg  DAILY in AM 2. Encourage improved lifestyle - low carb, low sugar diet, reduce portion size, continue improving regular exercise 3. Check CBG, bring log to next visit for review 4. Continue ASA, ARB, Statin 5. Follow-up 6 months Annual and labs      Hyperlipidemia associated with type 2 diabetes mellitus ([place])    Controlled cholesterol, improved TG on statin and diet/lifestyle  LDL goal < 70 per Cards Last lipid panel  11/19 ASCVD risk score is elevated due to known DM, CAD  Plan: 1. Continue current meds - Rosuvastatin 20mg  daily - Also on Zetia per cardiology since last visit 2. Continue ASA 81mg  for secondary ASCVD risk reduction 3. Encourage improved lifestyle - low carb/cholesterol, reduce portion size, continue improving exercise 4. Follow-up yearly w/ lipid      Hypothyroidism, postop    Mild low TSH, but normal Free T4 Mostly asymptomatic Continue current Levothyroxine 139mcg daily for now - discussed options, agree to not change yet Follow-up 6 months with labs thyroid      Obesity (BMI 30.0-34.9)    Significant weight loss on Ozempic and lifestyle Encourage continue healthy  lifestyle Down about 15 lbs in 6 months         No orders of the defined types were placed in this encounter.   Follow-up: - Return in 6 months for Annual Physical - Future labs 12/2018 approx  Patient verbalizes understanding with the above medical recommendations including the limitation of remote medical advice.  Specific follow-up and call-back criteria were given for patient to follow-up or seek medical care more urgently if needed.  Total duration of direct patient care provided via video conference: 18 minutes   Nobie Putnam, [facility] 07/06/2018, 11:14 AM

## 2018-07-06 NOTE — Assessment & Plan Note (Signed)
Well controlled A1c down to 6.8, still improving, lifestyle and weight loss Complications - CKD-II, improved, Hyperlipidemia, Hypothyroidism Prior meds: Januvia, insulin  Plan:  1. CONTINUE Ozempic 0.5mg  weekly inj - no plan to increase to 1mg  at this time, history of some nausea, doing well currently. If need financial assistance can contact us, may consider samples through company or [employer] for financial assistance - CONTINUE Metformin XR to 3 pills for 1500mg  DAILY in AM 2. Encourage improved lifestyle - low carb, low sugar diet, reduce portion size, continue improving regular exercise 3. Check CBG, bring log to next visit for review 4. Continue ASA, ARB, Statin 5. Follow-up 6 months Annual and labs

## 2018-07-06 NOTE — Assessment & Plan Note (Signed)
Controlled cholesterol, improved TG on statin and diet/lifestyle  LDL goal < 70 per Cards Last lipid panel 11/19 ASCVD risk score is elevated due to known DM, CAD  Plan: 1. Continue current meds - Rosuvastatin 20mg  daily - Also on Zetia per cardiology since last visit 2. Continue ASA 81mg  for secondary ASCVD risk reduction 3. Encourage improved lifestyle - low carb/cholesterol, reduce portion size, continue improving exercise 4. Follow-up yearly w/ lipid

## 2018-07-06 NOTE — Assessment & Plan Note (Addendum)
Well-controlled HTN Complication with CKD-II, improved from CKD III    Plan:  1. Continue current BP regimen Losartan 100mg  daily, Carvedilol 3.125mg  BID, Amlodipine 2.5mg  2. Encourage improved lifestyle - low sodium diet, regular exercise 3. Continue monitor BP outside office, bring readings to next visit, if persistently >140/90 or new symptoms notify office sooner

## 2018-07-06 NOTE — Assessment & Plan Note (Signed)
Significant weight loss on Ozempic and lifestyle Encourage continue healthy lifestyle Down about 15 lbs in 6 months

## 2018-07-06 NOTE — Assessment & Plan Note (Signed)
Mild low TSH, but normal Free T4 Mostly asymptomatic Continue current Levothyroxine 12mcg daily for now - discussed options, agree to not change yet Follow-up 6 months with labs thyroid

## 2018-07-06 NOTE — Assessment & Plan Note (Signed)
Improved Cr Secondary to DM HTN age Limit NSAID See A&P

## 2018-08-10 LAB — HEMOGLOBIN A1C: Hemoglobin A1C: 7.7

## 2018-08-27 ENCOUNTER — Other Ambulatory Visit: Payer: Self-pay | Admitting: Family Medicine

## 2018-08-27 DIAGNOSIS — F329 Major depressive disorder, single episode, unspecified: Secondary | ICD-10-CM

## 2018-08-27 DIAGNOSIS — F32A Depression, unspecified: Secondary | ICD-10-CM

## 2018-09-04 DIAGNOSIS — I10 Essential (primary) hypertension: Secondary | ICD-10-CM | POA: Diagnosis not present

## 2018-09-04 DIAGNOSIS — G4733 Obstructive sleep apnea (adult) (pediatric): Secondary | ICD-10-CM | POA: Diagnosis not present

## 2018-09-04 DIAGNOSIS — E1140 Type 2 diabetes mellitus with diabetic neuropathy, unspecified: Secondary | ICD-10-CM | POA: Diagnosis not present

## 2018-09-08 ENCOUNTER — Telehealth: Payer: Self-pay

## 2018-09-08 DIAGNOSIS — E1121 Type 2 diabetes mellitus with diabetic nephropathy: Secondary | ICD-10-CM

## 2018-09-08 NOTE — Telephone Encounter (Signed)
Thanks.  Samples are good as a temporary option.  I have placed a referral to [place] for [name], [facility] to help with coordinating these financial assistance options for her med ozempic.  Nobie Putnam, [facility] 09/08/2018, 5:05 PM

## 2018-09-08 NOTE — Telephone Encounter (Signed)
Could you contact Anderson Malta with [employer] for requesting more Ozempic samples?  We have 3 pens left, I just advised 1 other patient to come get 1 of them.  So we have 2 pens left. Shakeya would probably need anywhere from 2-4 pens due to medicare donut hole for now.  That would take the only 2 we have left.  Thanks - if you can get some more samples soon, then this patient can have our 2, or she can wait for a shipment of her own.  I'll respond to patient message and say that we are requesting more samples, and stay tuned - if you could contact her when we are ready for her to pick up that would be great.  Nobie Putnam, [facility] 09/08/2018, 12:17 PM

## 2018-09-08 NOTE — Telephone Encounter (Signed)
I called and spoke with Janett Billow from [employer] and she recommended 2 options.   Option #1 Have the patient to contact Social Security and find out if she qualifies for dual eligibility to help with the cost of her medications.  Option #2- Apply for assistance through [email or web address]  She also informed me that she will bring more samples by on Monday to give to the patient.

## 2018-09-14 ENCOUNTER — Ambulatory Visit (INDEPENDENT_AMBULATORY_CARE_PROVIDER_SITE_OTHER): Payer: Medicare Other | Admitting: Pharmacist

## 2018-09-14 DIAGNOSIS — E1121 Type 2 diabetes mellitus with diabetic nephropathy: Secondary | ICD-10-CM

## 2018-09-14 NOTE — Chronic Care Management (AMB) (Signed)
Chronic Care Management   Note  09/14/2018 Name: [name] MRN: [id number] DOB: 11/22/1945   Subjective:   [name] is a 73 y.o. year old female who is a primary care patient of Olin Hauser, DO. The CM team was consulted for assistance with chronic disease management and care coordination, related to medication assistance for the cost of her Ozempic.  I reached out to Modena Nunnery by phone today.   [name] was given information about Chronic Care Management services today including:  1. CCM service includes personalized support from designated clinical staff supervised by her physician, including individualized plan of care and coordination with other care providers 2. 24/7 contact phone numbers for assistance for urgent and routine care needs. 3. Service will only be billed when office clinical staff spend 20 minutes or more in a month to coordinate care. 4. Only one practitioner may furnish and bill the service in a calendar month. 5. The patient may stop CCM services at any time (effective at the end of the month) by phone call to the office staff. 6. The patient will be responsible for cost sharing (co-pay) of up to 20% of the service fee (after annual deductible is met).  Patient agreed to services and verbal consent obtained.   Review of patient status, including review of consultants reports, laboratory and other test data, was performed as part of comprehensive evaluation and provision of chronic care management services.   Objective:  Lab Results  Component Value Date   CREATININE 0.90 06/30/2018   CREATININE 1.11 (H) 12/30/2017   CREATININE 1.02 (H) 09/26/2017    Lab Results  Component Value Date   HGBA1C 6.8 (H) 06/30/2018       Component Value Date/Time   CHOL 145 12/30/2017 0857   CHOL 160 11/21/2014 0951   TRIG 121 12/30/2017 0857   HDL 54 12/30/2017 0857   HDL 56 11/21/2014 0951   CHOLHDL 2.7 12/30/2017 0857   VLDL  30 12/11/2015 1528   LDLCALC 71 12/30/2017 0857    BP Readings from Last 3 Encounters:  01/23/18 (!) 144/82  01/05/18 (!) 119/47  09/28/17 (!) 121/51    No Known Allergies  Medications Reviewed Today    Reviewed by Olin Hauser, DO (Physician) on 07/06/18 at [place] List Status: <None>  Medication Order Taking? Sig Documenting Provider Last Dose Status Informant  ACCU-CHEK AVIVA PLUS test strip [id number] Yes U UTD BID Olin Hauser, DO Taking Active   acetaminophen (TYLENOL) 500 MG tablet [id number] Yes Take 2 tablets by mouth 3 (three) times daily as needed. [provider] Taking Active            Med Note (KREBS, AMY L   Thu Jul 11, 2014  2:11 PM) Medication taken as needed. OTC Received from: [facility]:   amLODipine (NORVASC) 2.5 MG tablet [id number] Yes TAKE 1 TABLET BY MOUTH  DAILY Olin Hauser, DO Taking Active   aspirin 81 MG tablet [id number] Yes Take 81 mg by mouth daily. [provider] Taking Active   carvedilol (COREG) 3.125 MG tablet [id number] Yes TAKE 1 TABLET BY MOUTH TWO  TIMES DAILY Parks Ranger, Devonne Doughty, DO Taking Active   ezetimibe (ZETIA) 10 MG tablet [id number] Yes Take 1 tablet (10 mg total) by mouth daily. Minna Merritts, MD Taking Active   FLUoxetine (PROZAC) 10 MG capsule [id number] Yes TAKE 1 CAPSULE BY MOUTH  DAILY Parks Ranger, Devonne Doughty, DO  Taking Active   fluticasone (FLONASE) 50 MCG/ACT nasal spray [id number] Yes Place 2 sprays into both nostrils daily. [provider] Taking Active   Insulin Pen Needle (NOVOFINE PLUS) 32G X 4 MM MISC [id number] Yes Use with Ozempic weekly injection as instructed Olin Hauser, DO Taking Active   levothyroxine (SYNTHROID, LEVOTHROID) 125 MCG tablet [id number] Yes TAKE 1 TABLET BY MOUTH  DAILY BEFORE BREAKFAST Olin Hauser, DO Taking Active   losartan (COZAAR) 100 MG tablet [id number] Yes TAKE 1 TABLET BY  MOUTH  DAILY Olin Hauser, DO Taking Active   metFORMIN (GLUCOPHAGE-XR) 500 MG 24 hr tablet [id number] Yes TAKE 3 TABLETS BY MOUTH  DAILY WITH BREAKFAST Karamalegos, Devonne Doughty, DO Taking Active   nitroGLYCERIN (NITROSTAT) 0.4 MG SL tablet [id number] Yes Place 1 tablet (0.4 mg total) under the tongue every 5 (five) minutes as needed for chest pain. Luciana Axe, NP Taking Active            Med Note [facility], Willey Blade   Mon Aug 30, 2016  6:46 AM) PRN, has never had to use   rosuvastatin (CRESTOR) 20 MG tablet [id number] Yes TAKE 1 TABLET BY MOUTH  DAILY Karamalegos, Devonne Doughty, DO Taking Active   Semaglutide,0.25 or 0.5MG/DOS, (OZEMPIC, 0.25 OR 0.5 MG/DOSE,) 2 MG/1.5ML SOPN [id number] Yes Inject 0.5 mg as directed once a week. Olin Hauser, DO Taking Active   Zoster Vaccine Adjuvanted [facility]) injection [id number]  Inject vaccine 0.5 mL once and repeat dose within 6 months  Patient not taking: Reported on 01/23/2018   Olin Hauser, DO  Consider Medication Status and Discontinue (Completed Course)            Assessment:   Goals Addressed            This Visit's Progress   . COMPLETED: Medication Assistance       Current Barriers:  . Financial Barriers - currently in Part D plan coverage gap  Pharmacist Clinical Goal(s):  Marland Kitchen Over the next 1 days, patient will work with CM Pharmacist to address needs related to medication assistance/cost savings options  Interventions: . Interview patient regarding medication assistance needs o [name] reports that her Ozempic copayment is currently over $600 as she is currently in the coverage gap of her plan.  o Reports having received samples of the Ozempic from her PCP  . Counsel patient regarding medication assistance options o Based on the financial information provide by patient, she does not qualify for either extra help subsidy through [employer] or the [employer] patient assistance program  for [employer]. o Patient confirms currently using [employer] mail order as well as [employer] benefit for cost savings o Review patient's health plan formulary and confirm that Ozempic is a tier 3 option through the patient's plan and no lower tier alternatives exist within the same class at this time o [name] states that she wishes to continue on Ozempic based on the benefits that she has had with this therapy. Reports that she will be able to budget to have the medication refilled when due again.  Patient Self Care Activities:  . Self administers medications as prescribed . Attends all scheduled provider appointments . Calls pharmacy for medication refills . Calls provider office for new concerns or questions  Initial goal documentation        Plan:  1) [name] denies any further medication question/concerns or need for medication review at this time.  2) The patient has been provided with contact information for the care management team and has been advised to call with any health related questions or concerns.   Harlow Asa, PharmD, [place] [employer] [phone]

## 2018-09-14 NOTE — Patient Instructions (Signed)
Thank you allowing the Chronic Care Management Team to be a part of your care! It was a pleasure speaking with you today!     CCM (Chronic Care Management) Team    Janci Minor RN, BSN Nurse Care Coordinator  [phone]   Harlow Asa PharmD  Clinical Pharmacist  [phone]   Eula Fried LCSW Clinical Social Worker [phone]  Visit Information  Goals Addressed            This Visit's Progress   . COMPLETED: Medication Assistance       Current Barriers:  . Financial Barriers - currently in Part D plan coverage gap  Pharmacist Clinical Goal(s):  Marland Kitchen Over the next 1 days, patient will work with CM Pharmacist to address needs related to medication assistance/cost savings options  Interventions: . Interview patient regarding medication assistance needs o Mrs. Maloney reports that her Ozempic copayment is currently over $600 as she is currently in the coverage gap of her plan.  o Reports having received samples of the Ozempic from PCP  . Counsel patient regarding medication assistance options o Based on the financial information provide by patient, she does not qualify for either extra help subsidy through [employer] or the [employer] patient assistance program for [employer]. o Patient confirms currently using [facility] mail order as well as [place] benefit for cost savings o Review patient's health plan formulary and confirm that Ozempic is a tier 3 option through the patient's plan and no lower tier alternatives exist within the same class at this time o Mrs. Horseman states that she wishes to continue on Ozempic based on the benefits that she has had with this therapy. Reports that she will be able to budget to have the medication refilled when due again.  Patient Self Care Activities:  . Self administers medications as prescribed . Attends all scheduled provider appointments . Calls pharmacy for medication refills . Calls  provider office for new concerns or questions  Initial goal documentation        Ms. Saban was given information about Chronic Care Management services today including:  1. CCM service includes personalized support from designated clinical staff supervised by her physician, including individualized plan of care and coordination with other care providers 2. 24/7 contact phone numbers for assistance for urgent and routine care needs. 3. Service will only be billed when office clinical staff spend 20 minutes or more in a month to coordinate care. 4. Only one practitioner may furnish and bill the service in a calendar month. 5. The patient may stop CCM services at any time (effective at the end of the month) by phone call to the office staff. 6. The patient will be responsible for cost sharing (co-pay) of up to 20% of the service fee (after annual deductible is met).  Patient agreed to services and verbal consent obtained.   The patient verbalized understanding of instructions provided today and declined a print copy of patient instruction materials.   The patient has been provided with contact information for the care management team and has been advised to call with any health related questions or concerns.   Harlow Asa, PharmD, [place] [employer] [phone]

## 2018-09-18 ENCOUNTER — Encounter: Payer: Self-pay | Admitting: Family Medicine

## 2018-10-12 ENCOUNTER — Ambulatory Visit: Payer: Medicare Other | Admitting: Licensed Clinical Social Worker

## 2018-10-12 DIAGNOSIS — E1121 Type 2 diabetes mellitus with diabetic nephropathy: Secondary | ICD-10-CM

## 2018-10-12 NOTE — Chronic Care Management (AMB) (Signed)
  Care Management   Follow Up Note   10/12/2018 Name: [name] MRN: [id number] DOB: 1945-12-20  Referred by: Olin Hauser, DO Reason for referral : Care Coordination   [name] is a 73 y.o. year old female who is a primary care patient of Olin Hauser, DO. The care management team was consulted for assistance with care management and care coordination needs.    Review of patient status, including review of consultants reports, relevant laboratory and other test results, and collaboration with appropriate care team members and the patient's provider was performed as part of comprehensive patient evaluation and provision of chronic care management services.    LCSW completed initial outreach call on 10/12/2018 and spoke with patient. HIPPA verifications received. Patient denied needing CCM Social Work services now that she has spoke to [employer] but was agreeable to take down LCSW's contact information in case future social work needs arise. LCSW will update CCM Pharmacist.    The patient will call LCSW* as advised if future social work needs arise.   Eula Fried, BSW, MSW, [email or web address] Phone: [phone]

## 2018-11-05 ENCOUNTER — Other Ambulatory Visit: Payer: Self-pay | Admitting: Family Medicine

## 2018-11-05 ENCOUNTER — Other Ambulatory Visit: Payer: Self-pay | Admitting: Cardiovascular Disease

## 2018-11-05 DIAGNOSIS — E1121 Type 2 diabetes mellitus with diabetic nephropathy: Secondary | ICD-10-CM

## 2018-11-05 DIAGNOSIS — I10 Essential (primary) hypertension: Secondary | ICD-10-CM

## 2018-11-22 ENCOUNTER — Other Ambulatory Visit: Payer: Self-pay

## 2018-11-22 NOTE — Patient Outreach (Signed)
[facility]) Care Management  11/22/2018  [name] 1945/12/13 [id number]   Medication Adherence call to Mrs. [name] Hippa Identifiers Verify patient is showing past due on Losartan 100 mg pt explain she is taking 1 tablet daily and has received from [facility] an has medication at this time. [name] is showing past due under [name].   [place] Management Direct Dial [phone]  Fax [phone] [email or web address]

## 2018-12-25 ENCOUNTER — Other Ambulatory Visit: Payer: Medicare Other

## 2018-12-25 DIAGNOSIS — E1121 Type 2 diabetes mellitus with diabetic nephropathy: Secondary | ICD-10-CM

## 2018-12-25 DIAGNOSIS — I129 Hypertensive chronic kidney disease with stage 1 through stage 4 chronic kidney disease, or unspecified chronic kidney disease: Secondary | ICD-10-CM | POA: Diagnosis not present

## 2018-12-25 DIAGNOSIS — Z0000 Encounter for general adult medical examination without abnormal findings: Secondary | ICD-10-CM

## 2018-12-25 DIAGNOSIS — F3340 Major depressive disorder, recurrent, in remission, unspecified: Secondary | ICD-10-CM

## 2018-12-25 DIAGNOSIS — E1169 Type 2 diabetes mellitus with other specified complication: Secondary | ICD-10-CM

## 2018-12-25 DIAGNOSIS — E785 Hyperlipidemia, unspecified: Secondary | ICD-10-CM | POA: Diagnosis not present

## 2018-12-25 DIAGNOSIS — E890 Postprocedural hypothyroidism: Secondary | ICD-10-CM | POA: Diagnosis not present

## 2018-12-25 DIAGNOSIS — N182 Chronic kidney disease, stage 2 (mild): Secondary | ICD-10-CM

## 2018-12-26 LAB — COMPLETE METABOLIC PANEL WITH GFR
AG Ratio: 1.5 (calc) (ref 1.0–2.5)
ALT: 12 U/L (ref 6–29)
AST: 16 U/L (ref 10–35)
Albumin: 3.7 g/dL (ref 3.6–5.1)
Alkaline phosphatase (APISO): 53 U/L (ref 37–153)
BUN/Creatinine Ratio: 13 (calc) (ref 6–22)
BUN: 12 mg/dL (ref 7–25)
CO2: 28 mmol/L (ref 20–32)
Calcium: 9 mg/dL (ref 8.6–10.4)
Chloride: 107 mmol/L (ref 98–110)
Creat: 0.96 mg/dL — ABNORMAL HIGH (ref 0.60–0.93)
GFR, Est African American: 68 mL/min/{1.73_m2} (ref >=60)
GFR, Est Non African American: 59 mL/min/{1.73_m2} — ABNORMAL LOW (ref >=60)
Globulin: 2.4 g/dL (calc) (ref 1.9–3.7)
Glucose, Bld: 117 mg/dL — ABNORMAL HIGH (ref 65–99)
Potassium: 4.6 mmol/L (ref 3.5–5.3)
Sodium: 142 mmol/L (ref 135–146)
Total Bilirubin: 0.4 mg/dL (ref 0.2–1.2)
Total Protein: 6.1 g/dL (ref 6.1–8.1)

## 2018-12-26 LAB — CBC WITH DIFFERENTIAL/PLATELET
Absolute Monocytes: 581 cells/uL (ref 200–950)
Basophils Absolute: 33 cells/uL (ref 0–200)
Basophils Relative: 0.5 %
Eosinophils Absolute: 323 cells/uL (ref 15–500)
Eosinophils Relative: 4.9 %
HCT: 35.8 % (ref 35.0–45.0)
Hemoglobin: 11.7 g/dL (ref 11.7–15.5)
Lymphs Abs: 2224 cells/uL (ref 850–3900)
MCH: 30.7 pg (ref 27.0–33.0)
MCHC: 32.7 g/dL (ref 32.0–36.0)
MCV: 94 fL (ref 80.0–100.0)
MPV: 11 fL (ref 7.5–12.5)
Monocytes Relative: 8.8 %
Neutro Abs: 3439 cells/uL (ref 1500–7800)
Neutrophils Relative %: 52.1 %
Platelets: 219 10*3/uL (ref 140–400)
RBC: 3.81 10*6/uL (ref 3.80–5.10)
RDW: 11.5 % (ref 11.0–15.0)
Total Lymphocyte: 33.7 %
WBC: 6.6 10*3/uL (ref 3.8–10.8)

## 2018-12-26 LAB — HEMOGLOBIN A1C
Hgb A1c MFr Bld: 6.5 % of total Hgb — ABNORMAL HIGH (ref <5.7)
Mean Plasma Glucose: 140 (calc)
eAG (mmol/L): 7.7 (calc)

## 2018-12-26 LAB — LIPID PANEL
Cholesterol: 113 mg/dL (ref <200)
HDL: 53 mg/dL (ref >=50)
LDL Cholesterol (Calc): 40 mg/dL (calc)
Non-HDL Cholesterol (Calc): 60 mg/dL (calc) (ref <130)
Total CHOL/HDL Ratio: 2.1 (calc) (ref <5.0)
Triglycerides: 110 mg/dL (ref <150)

## 2018-12-26 LAB — TSH: TSH: 0.03 mIU/L — ABNORMAL LOW (ref 0.40–4.50)

## 2018-12-26 LAB — T4, FREE: Free T4: 1.5 ng/dL (ref 0.8–1.8)

## 2019-01-01 ENCOUNTER — Other Ambulatory Visit: Payer: Self-pay

## 2019-01-01 ENCOUNTER — Encounter: Payer: Self-pay | Admitting: Family Medicine

## 2019-01-01 ENCOUNTER — Ambulatory Visit (INDEPENDENT_AMBULATORY_CARE_PROVIDER_SITE_OTHER): Payer: Medicare Other | Admitting: Family Medicine

## 2019-01-01 VITALS — BP 117/65 | HR 88 | Temp 98.6°F | Resp 16 | Ht 67.0 in | Wt 220.0 lb

## 2019-01-01 DIAGNOSIS — Z9989 Dependence on other enabling machines and devices: Secondary | ICD-10-CM

## 2019-01-01 DIAGNOSIS — Z1231 Encounter for screening mammogram for malignant neoplasm of breast: Secondary | ICD-10-CM

## 2019-01-01 DIAGNOSIS — E1169 Type 2 diabetes mellitus with other specified complication: Secondary | ICD-10-CM

## 2019-01-01 DIAGNOSIS — I119 Hypertensive heart disease without heart failure: Secondary | ICD-10-CM

## 2019-01-01 DIAGNOSIS — Z0000 Encounter for general adult medical examination without abnormal findings: Secondary | ICD-10-CM

## 2019-01-01 DIAGNOSIS — N183 Chronic kidney disease, stage 3 unspecified: Secondary | ICD-10-CM

## 2019-01-01 DIAGNOSIS — E1121 Type 2 diabetes mellitus with diabetic nephropathy: Secondary | ICD-10-CM

## 2019-01-01 DIAGNOSIS — E66811 Obesity, class 1: Secondary | ICD-10-CM

## 2019-01-01 DIAGNOSIS — E785 Hyperlipidemia, unspecified: Secondary | ICD-10-CM

## 2019-01-01 DIAGNOSIS — G4733 Obstructive sleep apnea (adult) (pediatric): Secondary | ICD-10-CM

## 2019-01-01 DIAGNOSIS — Z8542 Personal history of malignant neoplasm of other parts of uterus: Secondary | ICD-10-CM

## 2019-01-01 DIAGNOSIS — E669 Obesity, unspecified: Secondary | ICD-10-CM | POA: Diagnosis not present

## 2019-01-01 DIAGNOSIS — I25118 Atherosclerotic heart disease of native coronary artery with other forms of angina pectoris: Secondary | ICD-10-CM

## 2019-01-01 DIAGNOSIS — I129 Hypertensive chronic kidney disease with stage 1 through stage 4 chronic kidney disease, or unspecified chronic kidney disease: Secondary | ICD-10-CM

## 2019-01-01 DIAGNOSIS — F3340 Major depressive disorder, recurrent, in remission, unspecified: Secondary | ICD-10-CM

## 2019-01-01 NOTE — Assessment & Plan Note (Signed)
Secondary to HTN Known CAD

## 2019-01-01 NOTE — Patient Instructions (Addendum)
Thank you for coming to the office today.  Recent Labs    06/30/18 0901 08/10/18 12/25/18 0953  HGBA1C 6.8* 7.7 6.5*    Keep up the great work.  Chip away at Metformin XR 500mg  x 3 pills now go down to x 2 pills daily, then after 6 weeks or so, can reduce to 1 pill a day.  DM Eye exam 04/2019  For Mammogram screening for breast cancer   Call the [place] below anytime to schedule your own appointment now that order has been placed.  [facility] [place], [place] [address] Phone: [phone]   Please schedule a Follow-up Appointment to: Return in about 6 months (around 07/01/2019) for 6 month DM A1c, med adjust ?DC metformin.  If you have any other questions or concerns, please feel free to call the office or send a message through [facility]. You may also schedule an earlier appointment if necessary.  Additionally, you may be receiving a survey about your experience at our office within a few days to 1 week by e-mail or mail. We value your feedback.  Grace Putnam, DO [place]

## 2019-01-01 NOTE — Assessment & Plan Note (Signed)
Followed by [facility] On medication management at this time.

## 2019-01-01 NOTE — Assessment & Plan Note (Addendum)
Well controlled, chronic OSA on CPAP, now >10 years - Last PSG [facility] - Good adherence to CPAP nightly - Continue current CPAP therapy, patient seems to be benefiting from therapy 

## 2019-01-01 NOTE — Assessment & Plan Note (Signed)
Stable, controlled on SSRI PHQ 2 Follow-up as needed

## 2019-01-01 NOTE — Assessment & Plan Note (Addendum)
Well controlled A1c down to 6.5, still improving, lifestyle and weight loss Complications - CKD-III Hyperlipidemia, Hypothyroidism Prior meds: Januvia, insulin  Plan:  1. CONTINUE Ozempic 0.5mg  weekly inj - no plan to increase to 1mg  at this time, history of some nausea, doing well currently. If need financial assistance can contact us, may consider samples through company or [employer] for financial assistance - 1 sample ozempic pen given today due to medicare donut hole - REDUCE Metformin XR 500mg  now to x 2 daily - in future reduce further, may titrate off 2. Encourage improved lifestyle - low carb, low sugar diet, reduce portion size, continue improving regular exercise 3. Check CBG, bring log to next visit for review 4. Continue ASA, ARB, Statin - DM Foot exam - Eye exam in 2021 due to re-scheduling issues 5. Follow-up 6 DM A1c

## 2019-01-01 NOTE — Assessment & Plan Note (Signed)
S/p hysterectomy and treatment Resolved

## 2019-01-01 NOTE — Assessment & Plan Note (Signed)
Controlled cholesterol, improved TG on statin and diet/lifestyle  LDL goal < 70 per Cards Last lipid panel 11/2018 ASCVD risk score is elevated due to known DM, CAD  Plan: 1. Continue current meds - Rosuvastatin 20mg  daily + Zetia per Cards 2. Continue ASA 81mg  for secondary ASCVD risk reduction 3. Encourage improved lifestyle - low carb/cholesterol, reduce portion size, continue improving exercise 4. Follow-up yearly w/ lipid

## 2019-01-01 NOTE — Assessment & Plan Note (Signed)
Well-controlled HTN Complication with CKD-III   Plan:  1. Continue current BP regimen Losartan 100mg  daily, Carvedilol 3.125mg  BID, Amlodipine 2.5mg  2. Encourage improved lifestyle - low sodium diet, regular exercise 3. Continue monitor BP outside office, bring readings to next visit, if persistently >140/90 or new symptoms notify office sooner

## 2019-01-01 NOTE — Assessment & Plan Note (Signed)
Weight loss continued on improve lifestyle diet / medication

## 2019-01-01 NOTE — Progress Notes (Signed)
Subjective:    Patient ID: [name], female    DOB: [birth year], 73 y.o.   MRN: [id number]  [name] is a 73 y.o. female presenting on 01/01/2019 for Annual Exam   HPI   Here for Annual Physical and Lab Review.  CHRONIC DM, Type 2 with CKD-III / Hypertension Last lab improved to A1c 6.5. Also chemistry showed improved Creatinine with reduced GFR She is doing well overall still on Ozempic 0.5mg , has had good weight loss CBGs:stable mostlyAvg100-120,no low.High < 150. Checks CBGs x1 fasting AM Meds: - Metformin XR 1500mg  daily x 3 in AM - asking about dose adjust - Ozempic 0.5mg  weekly inj - minimal nausea occasionally - improved Reports good compliance, tolerating well Currently on ARB, ASA 81, Statin Lifestyle: - Weight down, see result - Diet(improving diet) - Exercise (improved regular exercise) - Next D[facility] exam is scheduled for 04/2019, was re-scheduled due to [place] Denies hypoglycemia, numbness tingling weakness   FOLLOW-UPHYPOTHYROIDISM, S/p surgical thyroidectomy Still low TSH but normal Free T4, she cannot feel difference wants to keep dose. - On Levothyroxine 112mcg daily  HLD / CAD / HTN heart disease Followed by Cards Last visit cardiology added Zetia to her statin, rosuvastatin 20mg . Doing well on these with controlled Lipid results.  OSA, on CPAP - Patient reports prior history of dx OSA and on CPAP for >10 yr - Today reports that sleep apnea is well controlled.  uses the CPAP machine every night. Tolerates the machine well, and thinks that sleeps better with it and feels good. No new concerns or symptoms.  Depression, mild chronic recurrent - In remission See PHQ score No new concerns. Continues on SSRI. Currently doing well.   Health Maintenance: UTD Flu Vaccine   Ordered Mammogram screening, patient to schedule.   Depression screen [facility] 2/9 01/01/2019 07/06/2018 01/05/2018  Decreased Interest 0 0 0  Down, Depressed,  Hopeless 0 0 0  PHQ - 2 Score 0 0 0  Altered sleeping 1 1 0  Tired, decreased energy 1 1 0  Change in appetite 0 0 0  Feeling bad or failure about yourself  0 0 0  Trouble concentrating 0 0 0  Moving slowly or fidgety/restless 0 0 0  Suicidal thoughts 0 0 0  PHQ-9 Score 2 2 0  Difficult doing work/chores Not difficult at all Not difficult at all Not difficult at all   No flowsheet data found.    Past Medical History:  Diagnosis Date  . Arthritis    knees   . Coronary artery disease    a. 07/2013 STEMI/PCI ([place]): LM nl, LAD 100p (thrombectomy & 3.5x23 Alpine DES), LCX nl, RCA nl, EF 45%.  . Endometrial cancer ([place])    THYROID CANCER  . Gravida 3 para 3   . Hyperlipidemia   . Hypertension   . Hypertensive heart disease   . Hypothyroid   . Ischemic cardiomyopathy    a. 07/2013 EF 45% @ time of STEMI; b. 11/2013 Echo: EF 55-60%, mildly dil LA, nl RV fxn.  . Menopause   . Myocardial infarction ([place])    2016  . Obstructive sleep apnea on CPAP   . Osteoarthritis    Past Surgical History:  Procedure Laterality Date  . ABDOMINAL HYSTERECTOMY    . CARDIAC CATHETERIZATION    . CATARACT EXTRACTION  08/30/2016  . CATARACT EXTRACTION W/PHACO Left 08/30/2016   Procedure: CATARACT EXTRACTION PHACO AND INTRAOCULAR LENS PLACEMENT ([place]) Left daibetic;  Surgeon: Leandrew Koyanagi,  MD;  Location: [facility];  Service: Ophthalmology;  Laterality: Left;  Diabetic - insulin and oral meds sleep apnea  . CATARACT EXTRACTION W/PHACO Right 10/07/2016   Procedure: CATARACT EXTRACTION PHACO AND INTRAOCULAR LENS PLACEMENT (IOC);  Surgeon: Leandrew Koyanagi, MD;  Location: [facility];  Service: Ophthalmology;  Laterality: Right;  Lot# [id number] H [place]: 00:43.3 AP%: 12.2 CDE: 5.28  . CORONARY ANGIOPLASTY     drug eluting stent placed at [place]    . LAPAROSCOPIC HYSTERECTOMY    . THYROIDECTOMY     Social History   Socioeconomic History  . Marital  status: Married    Spouse name: Not on file  . Number of children: Not on file  . Years of education: Not on file  . Highest education level: Not on file  Occupational History  . Not on file  Social Needs  . Financial resource strain: Not on file  . Food insecurity    Worry: Not on file    Inability: Not on file  . Transportation needs    Medical: Not on file    Non-medical: Not on file  Tobacco Use  . Smoking status: Never Smoker  . Smokeless tobacco: Never Used  Substance and Sexual Activity  . Alcohol use: No  . Drug use: No  . Sexual activity: Not on file  Lifestyle  . Physical activity    Days per week: Not on file    Minutes per session: Not on file  . Stress: Not on file  Relationships  . Social Herbalist on phone: Not on file    Gets together: Not on file    Attends religious service: Not on file    Active member of club or organization: Not on file    Attends meetings of clubs or organizations: Not on file    Relationship status: Not on file  . Intimate partner violence    Fear of current or ex partner: Not on file    Emotionally abused: Not on file    Physically abused: Not on file    Forced sexual activity: Not on file  Other Topics Concern  . Not on file  Social History Narrative  . Not on file   Family History  Problem Relation Age of Onset  . Heart attack Father   . Heart failure Father   . Hypertension Father   . Hyperlipidemia Father   . Heart disease Father 32       CABG   . Pulmonary fibrosis Son   . Breast cancer Neg Hx    Current Outpatient Medications on File Prior to Visit  Medication Sig  . ACCU-CHEK AVIVA PLUS test strip U UTD BID  . acetaminophen (TYLENOL) 500 MG tablet Take 2 tablets by mouth 3 (three) times daily as needed.  Marland Kitchen amLODipine (NORVASC) 2.5 MG tablet TAKE 1 TABLET BY MOUTH  DAILY  . aspirin 81 MG tablet Take 81 mg by mouth daily.  . carvedilol (COREG) 3.125 MG tablet TAKE 1 TABLET BY MOUTH TWO  TIMES DAILY   . ezetimibe (ZETIA) 10 MG tablet TAKE 1 TABLET BY MOUTH  DAILY  . FLUoxetine (PROZAC) 10 MG capsule TAKE 1 CAPSULE BY MOUTH  DAILY  . fluticasone (FLONASE) 50 MCG/ACT nasal spray Place 2 sprays into both nostrils daily.  . Insulin Pen Needle (NOVOFINE PLUS) 32G X 4 MM MISC Use with Ozempic weekly injection as instructed  . levothyroxine (SYNTHROID, LEVOTHROID) 125  MCG tablet TAKE 1 TABLET BY MOUTH  DAILY BEFORE BREAKFAST  . losartan (COZAAR) 100 MG tablet TAKE 1 TABLET BY MOUTH  DAILY  . metFORMIN (GLUCOPHAGE-XR) 500 MG 24 hr tablet TAKE 3 TABLETS BY MOUTH  DAILY WITH BREAKFAST  . nitroGLYCERIN (NITROSTAT) 0.4 MG SL tablet Place 1 tablet (0.4 mg total) under the tongue every 5 (five) minutes as needed for chest pain.  . rosuvastatin (CRESTOR) 20 MG tablet TAKE 1 TABLET BY MOUTH  DAILY  . Semaglutide,0.25 or 0.5MG /DOS, (OZEMPIC, 0.25 OR 0.5 MG/DOSE,) 2 MG/1.5ML SOPN Inject 0.5 mg as directed once a week.  . Zoster Vaccine Adjuvanted [facility]) injection Inject vaccine 0.5 mL once and repeat dose within 6 months   No current facility-administered medications on file prior to visit.     Review of Systems  Constitutional: Negative for activity change, appetite change, chills, diaphoresis, fatigue and fever.  HENT: Negative for congestion and hearing loss.   Eyes: Negative for visual disturbance.  Respiratory: Negative for apnea, cough, chest tightness, shortness of breath and wheezing.   Cardiovascular: Negative for chest pain, palpitations and leg swelling.  Gastrointestinal: Negative for abdominal pain, anal bleeding, blood in stool, constipation, diarrhea, nausea and vomiting.  Endocrine: Negative for cold intolerance.  Genitourinary: Negative for difficulty urinating, dysuria, frequency and hematuria.  Musculoskeletal: Negative for arthralgias, back pain and neck pain.  Skin: Negative for rash.  Allergic/Immunologic: Negative for environmental allergies.  Neurological: Negative for  dizziness, weakness, light-headedness, numbness and headaches.  Hematological: Negative for adenopathy.  Psychiatric/Behavioral: Negative for behavioral problems, dysphoric mood and sleep disturbance. The patient is not nervous/anxious.    Per HPI unless specifically indicated above      Objective:    BP 117/65   Pulse 88   Temp 98.6 F (37 C) (Oral)   Resp 16   Ht 5\' 7"  (1.702 m)   Wt 220 lb (99.8 kg)   BMI 34.46 kg/m   Wt Readings from Last 3 Encounters:  01/01/19 220 lb (99.8 kg)  07/06/18 223 lb 6.4 oz (101.3 kg)  01/23/18 238 lb (108 kg)    Physical Exam Vitals signs and nursing note reviewed.  Constitutional:      General: She is not in acute distress.    Appearance: She is well-developed. She is not diaphoretic.     Comments: Well-appearing, comfortable, cooperative  HENT:     Head: Normocephalic and atraumatic.  Eyes:     General:        Right eye: No discharge.        Left eye: No discharge.     Conjunctiva/sclera: Conjunctivae normal.     Pupils: Pupils are equal, round, and reactive to light.  Neck:     Musculoskeletal: Normal range of motion and neck supple.     Thyroid: No thyromegaly.  Cardiovascular:     Rate and Rhythm: Normal rate and regular rhythm.     Heart sounds: Normal heart sounds. No murmur.  Pulmonary:     Effort: Pulmonary effort is normal. No respiratory distress.     Breath sounds: Normal breath sounds. No wheezing or rales.  Abdominal:     General: Bowel sounds are normal. There is no distension.     Palpations: Abdomen is soft. There is no mass.     Tenderness: There is no abdominal tenderness.  Musculoskeletal: Normal range of motion.        General: No tenderness.     Comments: Upper / Lower Extremities: - Normal  muscle tone, strength bilateral upper extremities 5/5, lower extremities 5/5  Lymphadenopathy:     Cervical: No cervical adenopathy.  Skin:    General: Skin is warm and dry.     Findings: No erythema or rash.   Neurological:     Mental Status: She is alert and oriented to person, place, and time.     Comments: Distal sensation intact to light touch all extremities  Psychiatric:        Behavior: Behavior normal.     Comments: Well groomed, good eye contact, normal speech and thoughts      Diabetic Foot Exam - Simple   Simple Foot Form Diabetic Foot exam was performed with the following findings: Yes 01/01/2019  9:39 AM  Visual Inspection No deformities, no ulcerations, no other skin breakdown bilaterally: Yes Sensation Testing Intact to touch and monofilament testing bilaterally: Yes Pulse Check Posterior Tibialis and Dorsalis pulse intact bilaterally: Yes Comments    Recent Labs    06/30/18 0901 08/10/18 12/25/18 0953  HGBA1C 6.8* 7.7 6.5*     Results for orders placed or performed in visit on 12/25/18  T4, free  Result Value Ref Range   Free T4 1.5 0.8 - 1.8 ng/dL  TSH  Result Value Ref Range   TSH 0.03 (L) 0.40 - 4.50 mIU/L  Lipid panel  Result Value Ref Range   Cholesterol 113 <200 mg/dL   HDL 53 > OR = 50 mg/dL   Triglycerides 110 <150 mg/dL   LDL Cholesterol (Calc) 40 mg/dL (calc)   Total CHOL/HDL Ratio 2.1 <5.0 (calc)   Non-HDL Cholesterol (Calc) 60 <130 mg/dL (calc)  COMPLETE METABOLIC PANEL WITH GFR  Result Value Ref Range   Glucose, Bld 117 (H) 65 - 99 mg/dL   BUN 12 7 - 25 mg/dL   Creat 0.96 (H) 0.60 - 0.93 mg/dL   GFR, Est Non African American 59 (L) > OR = 60 mL/min/1.59m2   GFR, Est African American 68 > OR = 60 mL/min/1.44m2   BUN/Creatinine Ratio 13 6 - 22 (calc)   Sodium 142 135 - 146 mmol/L   Potassium 4.6 3.5 - 5.3 mmol/L   Chloride 107 98 - 110 mmol/L   CO2 28 20 - 32 mmol/L   Calcium 9.0 8.6 - 10.4 mg/dL   Total Protein 6.1 6.1 - 8.1 g/dL   Albumin 3.7 3.6 - 5.1 g/dL   Globulin 2.4 1.9 - 3.7 g/dL (calc)   AG Ratio 1.5 1.0 - 2.5 (calc)   Total Bilirubin 0.4 0.2 - 1.2 mg/dL   Alkaline phosphatase (APISO) 53 37 - 153 U/L   AST 16 10 - 35 U/L    ALT 12 6 - 29 U/L  CBC with Differential/Platelet  Result Value Ref Range   WBC 6.6 3.8 - 10.8 Thousand/uL   RBC 3.81 3.80 - 5.10 Million/uL   Hemoglobin 11.7 11.7 - 15.5 g/dL   HCT 35.8 35.0 - 45.0 %   MCV 94.0 80.0 - 100.0 fL   MCH 30.7 27.0 - 33.0 pg   MCHC 32.7 32.0 - 36.0 g/dL   RDW 11.5 11.0 - 15.0 %   Platelets 219 140 - 400 Thousand/uL   MPV 11.0 7.5 - 12.5 fL   Neutro Abs 3,439 1,500 - 7,800 cells/uL   Lymphs Abs 2,224 850 - 3,900 cells/uL   Absolute Monocytes 581 200 - 950 cells/uL   Eosinophils Absolute 323 15 - 500 cells/uL   Basophils Absolute 33 0 - 200 cells/uL  Neutrophils Relative % 52.1 %   Total Lymphocyte 33.7 %   Monocytes Relative 8.8 %   Eosinophils Relative 4.9 %   Basophils Relative 0.5 %  Hemoglobin A1c  Result Value Ref Range   Hgb A1c MFr Bld 6.5 (H) <5.7 % of total Hgb   Mean Plasma Glucose 140 (calc)   eAG (mmol/L) 7.7 (calc)      Assessment & Plan:   Problem List Items Addressed This Visit    Obstructive sleep apnea on CPAP    Well controlled, chronic OSA on CPAP, now >10 years - Last PSG [facility] - Good adherence to CPAP nightly - Continue current CPAP therapy, patient seems to be benefiting from therapy      Obesity (BMI 30.0-34.9)    Weight loss continued on improve lifestyle diet / medication      Major depressive disorder, recurrent, in remission ([place])    Stable, controlled on SSRI PHQ 2 Follow-up as needed      Hypertensive heart disease    Secondary to HTN Known CAD      Hyperlipidemia associated with type 2 diabetes mellitus ([place])    Controlled cholesterol, improved TG on statin and diet/lifestyle  LDL goal < 70 per Cards Last lipid panel 11/2018 ASCVD risk score is elevated due to known DM, CAD  Plan: 1. Continue current meds - Rosuvastatin 20mg  daily + Zetia per Cards 2. Continue ASA 81mg  for secondary ASCVD risk reduction 3. Encourage improved lifestyle - low carb/cholesterol, reduce portion size, continue  improving exercise 4. Follow-up yearly w/ lipid      History of endometrial cancer    S/p hysterectomy and treatment Resolved      Coronary artery disease of native artery of native heart with stable angina pectoris ([place])    Followed by [facility] On medication management at this time.      Controlled type 2 diabetes mellitus with diabetic nephropathy (HCC)    Well controlled A1c down to 6.5, still improving, lifestyle and weight loss Complications - CKD-III Hyperlipidemia, Hypothyroidism Prior meds: Januvia, insulin  Plan:  1. CONTINUE Ozempic 0.5mg  weekly inj - no plan to increase to 1mg  at this time, history of some nausea, doing well currently. If need financial assistance can contact us, may consider samples through company or [place] for financial assistance - 1 sample ozempic pen given today due to medicare donut hole - REDUCE Metformin XR 500mg  now to x 2 daily - in future reduce further, may titrate off 2. Encourage improved lifestyle - low carb, low sugar diet, reduce portion size, continue improving regular exercise 3. Check CBG, bring log to next visit for review 4. Continue ASA, ARB, Statin - DM Foot exam - Eye exam in 2021 due to re-scheduling issues 5. Follow-up 6 DM A1c      Benign hypertension with CKD (chronic kidney disease) stage III    Well-controlled HTN Complication with CKD-III   Plan:  1. Continue current BP regimen Losartan 100mg  daily, Carvedilol 3.125mg  BID, Amlodipine 2.5mg  2. Encourage improved lifestyle - low sodium diet, regular exercise 3. Continue monitor BP outside office, bring readings to next visit, if persistently >140/90 or new symptoms notify office sooner       Other Visit Diagnoses    Annual physical exam    -  Primary   Encounter for screening mammogram for malignant neoplasm of breast       Relevant Orders   MM DIGITAL SCREENING BILATERAL  Updated Health Maintenance information Reviewed recent lab results  with patient Encouraged improvement to lifestyle with diet and exercise Goal of weight loss   No orders of the defined types were placed in this encounter.    Follow up plan: Return in about 6 months (around 07/01/2019) for 6 month DM A1c, med adjust ?DC metformin.  Nobie Putnam, [facility] 01/01/2019, 9:24 AM

## 2019-01-03 ENCOUNTER — Other Ambulatory Visit: Payer: Self-pay | Admitting: Family Medicine

## 2019-01-03 DIAGNOSIS — F32A Depression, unspecified: Secondary | ICD-10-CM

## 2019-01-03 DIAGNOSIS — F329 Major depressive disorder, single episode, unspecified: Secondary | ICD-10-CM

## 2019-01-03 DIAGNOSIS — I10 Essential (primary) hypertension: Secondary | ICD-10-CM

## 2019-01-23 NOTE — Progress Notes (Signed)
Cardiology Office Note  Date:  01/24/2019   ID:  [name], [name] February 03, 1946, MRN [id number]  PCP:  Olin Hauser, DO   Chief Complaint  Patient presents with  . Other    12 month follow up. Patient denies chest paina dn SOB at this time. Meds reviewed verbally with patient.     HPI:  [name] is a pleasant 73 year old woman with past medical history of CAD June 2015, anterior ST elevation MI in [place]  thrombectomy and drug-eluting stent placement to the LAD.  EF 45%  echocardiogram in October 2015 showed normalization of LV function, at 55-60%.  hypertension, hyperlipidemia,  diabetes morbid obesity son passed in December 2016. She presents for follow-up of her coronary artery disease  In follow-up today reports she is doing well No regular exercise program Trying to moderate her diet On new medications for diabetes which is helping her numbers Denies any chest pain concerning for angina No significant shortness of breath   Lab work reviewed with her in detail HBAic 7.3 to 6.9 to 6.5 Total chol 150 down 113  LDL 71 to 40   EKG personally reviewed by myself on todays visit Shows normal sinus rhythm rate 76 bpm no significant ST or T wave changes   PMH:   has a past medical history of Arthritis, Coronary artery disease, Endometrial cancer ([place]), Gravida 3 para 3, Hyperlipidemia, Hypertension, Hypertensive heart disease, Hypothyroid, Ischemic cardiomyopathy, Menopause, Myocardial infarction ([place]), Obstructive sleep apnea on CPAP, and Osteoarthritis.  PSH:    Past Surgical History:  Procedure Laterality Date  . ABDOMINAL HYSTERECTOMY    . CARDIAC CATHETERIZATION    . CATARACT EXTRACTION  08/30/2016  . CATARACT EXTRACTION W/PHACO Left 08/30/2016   Procedure: CATARACT EXTRACTION PHACO AND INTRAOCULAR LENS PLACEMENT ([place]) Left daibetic;  Surgeon: Leandrew Koyanagi, MD;  Location: [facility];  Service: Ophthalmology;  Laterality:  Left;  Diabetic - insulin and oral meds sleep apnea  . CATARACT EXTRACTION W/PHACO Right 10/07/2016   Procedure: CATARACT EXTRACTION PHACO AND INTRAOCULAR LENS PLACEMENT ([facility]);  Surgeon: Leandrew Koyanagi, MD;  Location: [facility];  Service: Ophthalmology;  Laterality: Right;  Lot# [id number] H [place]: 00:43.3 AP%: 12.2 CDE: 5.28  . CORONARY ANGIOPLASTY     drug eluting stent placed at [place]    . LAPAROSCOPIC HYSTERECTOMY    . THYROIDECTOMY      Current Outpatient Medications  Medication Sig Dispense Refill  . ACCU-CHEK AVIVA PLUS test strip U UTD BID 100 each 12  . acetaminophen (TYLENOL) 500 MG tablet Take 2 tablets by mouth 3 (three) times daily as needed.    Marland Kitchen amLODipine (NORVASC) 2.5 MG tablet TAKE 1 TABLET BY MOUTH  DAILY 90 tablet 3  . aspirin 81 MG tablet Take 81 mg by mouth daily.    . carvedilol (COREG) 3.125 MG tablet TAKE 1 TABLET BY MOUTH TWO  TIMES DAILY 180 tablet 3  . ezetimibe (ZETIA) 10 MG tablet TAKE 1 TABLET BY MOUTH  DAILY 90 tablet 3  . FLUoxetine (PROZAC) 10 MG capsule TAKE 1 CAPSULE BY MOUTH  DAILY 90 capsule 3  . fluticasone (FLONASE) 50 MCG/ACT nasal spray Place 2 sprays into both nostrils daily.    . Insulin Pen Needle (NOVOFINE PLUS) 32G X 4 MM MISC Use with Ozempic weekly injection as instructed 30 each 3  . levothyroxine (SYNTHROID, LEVOTHROID) 125 MCG tablet TAKE 1 TABLET BY MOUTH  DAILY BEFORE BREAKFAST 90 tablet 3  .  losartan (COZAAR) 100 MG tablet TAKE 1 TABLET BY MOUTH  DAILY 90 tablet 3  . metFORMIN (GLUCOPHAGE-XR) 500 MG 24 hr tablet TAKE 3 TABLETS BY MOUTH  DAILY WITH BREAKFAST 270 tablet 3  . nitroGLYCERIN (NITROSTAT) 0.4 MG SL tablet Place 1 tablet (0.4 mg total) under the tongue every 5 (five) minutes as needed for chest pain. 30 tablet 11  . rosuvastatin (CRESTOR) 20 MG tablet TAKE 1 TABLET BY MOUTH  DAILY 90 tablet 3  . Semaglutide,0.25 or 0.5MG /DOS, (OZEMPIC, 0.25 OR 0.5 MG/DOSE,) 2 MG/1.5ML SOPN Inject 0.5 mg  as directed once a week. 4.5 mL 3  . Zoster Vaccine Adjuvanted [facility]) injection Inject vaccine 0.5 mL once and repeat dose within 6 months 0.5 mL 1   No current facility-administered medications for this visit.      Allergies:   Patient has no known allergies.   Social History:  The patient  reports that she has never smoked. She has never used smokeless tobacco. She reports that she does not drink alcohol or use drugs.   Family History:   family history includes Heart attack in her father; Heart disease (age of onset: 99) in her father; Heart failure in her father; Hyperlipidemia in her father; Hypertension in her father; Pulmonary fibrosis in her son.    Review of Systems: Review of Systems  Constitutional: Negative.   HENT: Negative.   Respiratory: Negative.   Cardiovascular: Negative.   Gastrointestinal: Negative.   Musculoskeletal: Negative.   Neurological: Negative.   Psychiatric/Behavioral: Negative.   All other systems reviewed and are negative.   PHYSICAL EXAM: VS:  BP 138/62 (BP Location: Left Arm, Patient Position: Sitting, Cuff Size: Normal)   Pulse 76   Ht 5\' 7"  (1.702 m)   Wt 220 lb 4 oz (99.9 kg)   SpO2 98%   BMI 34.50 kg/m  , BMI Body mass index is 34.5 kg/m. Constitutional:  oriented to person, place, and time. No distress.  HENT:  Head: Grossly normal Eyes:  no discharge. No scleral icterus.  Neck: No JVD, no carotid bruits  Cardiovascular: Regular rate and rhythm, no murmurs appreciated Pulmonary/Chest: Clear to auscultation bilaterally, no wheezes or rails Abdominal: Soft.  no distension.  no tenderness.  Musculoskeletal: Normal range of motion Neurological:  normal muscle tone. Coordination normal. No atrophy Skin: Skin warm and dry Psychiatric: normal affect,   Recent Labs: 12/25/2018: ALT 12; BUN 12; Creat 0.96; Hemoglobin 11.7; Platelets 219; Potassium 4.6; Sodium 142; TSH 0.03    Lipid Panel Lab Results  Component Value Date   CHOL  113 12/25/2018   HDL 53 12/25/2018   LDLCALC 40 12/25/2018   TRIG 110 12/25/2018      Wt Readings from Last 3 Encounters:  01/24/19 220 lb 4 oz (99.9 kg)  01/01/19 220 lb (99.8 kg)  07/06/18 223 lb 6.4 oz (101.3 kg)     ASSESSMENT AND PLAN:  Coronary artery disease of native artery of native heart with stable angina pectoris (HCC) -  Currently with no symptoms of angina. No further workup at this time. Continue current medication regimen. stable  Essential (primary) hypertension - Plan: EKG 12-Lead Blood pressure is well controlled on today's visit. No changes made to the medications. Stable  Mixed hyperlipidemia -   numbers are fantastic on Crestor Zetia No changes made  Type 2 diabetes mellitus with other circulatory complication, with long-term current use of insulin ([place]) - Plan: EKG 12-Lead Recommended walking program, continue dietary changes for slow gentle  weight loss A1c dramatically improved on current medications, 6.5  Chronic kidney disease (CKD) stage G3a/A1,  Stable numbers Improved diabetes numbers  Obstructive sleep apnea on CPAP Compliant with her cpap Sleeping well  Morbid obesity ([place]) Stressed importance of slow gentle weight loss Recommended a walking program  Ischemic cardiomyopathy - Plan: EKG 12-Lead Euvolemic Previous ejection fraction 55 to 60% in May 2017   Total encounter time more than 25 minutes  Greater than 50% was spent in counseling and coordination of care with the patient ,  Disposition:   F/U  12 months   Orders Placed This Encounter  Procedures  . EKG 12-Lead     Signed, Esmond Plants, M.D., Ph.D. 01/24/2019  [place], [place]

## 2019-01-24 ENCOUNTER — Other Ambulatory Visit: Payer: Self-pay

## 2019-01-24 ENCOUNTER — Encounter: Payer: Self-pay | Admitting: Cardiovascular Disease

## 2019-01-24 ENCOUNTER — Ambulatory Visit (INDEPENDENT_AMBULATORY_CARE_PROVIDER_SITE_OTHER): Payer: Medicare Other | Admitting: Cardiovascular Disease

## 2019-01-24 VITALS — BP 138/62 | HR 76 | Ht 67.0 in | Wt 220.2 lb

## 2019-01-24 DIAGNOSIS — Z794 Long term (current) use of insulin: Secondary | ICD-10-CM

## 2019-01-24 DIAGNOSIS — I255 Ischemic cardiomyopathy: Secondary | ICD-10-CM

## 2019-01-24 DIAGNOSIS — E1169 Type 2 diabetes mellitus with other specified complication: Secondary | ICD-10-CM

## 2019-01-24 DIAGNOSIS — I25118 Atherosclerotic heart disease of native coronary artery with other forms of angina pectoris: Secondary | ICD-10-CM

## 2019-01-24 DIAGNOSIS — N1831 Chronic kidney disease, stage 3a: Secondary | ICD-10-CM

## 2019-01-24 DIAGNOSIS — E1159 Type 2 diabetes mellitus with other circulatory complications: Secondary | ICD-10-CM

## 2019-01-24 DIAGNOSIS — Z6841 Body Mass Index (BMI) 40.0 and over, adult: Secondary | ICD-10-CM

## 2019-01-24 DIAGNOSIS — E785 Hyperlipidemia, unspecified: Secondary | ICD-10-CM

## 2019-01-24 NOTE — Patient Instructions (Signed)

## 2019-01-25 ENCOUNTER — Other Ambulatory Visit: Payer: Self-pay | Admitting: Family Medicine

## 2019-01-25 DIAGNOSIS — E890 Postprocedural hypothyroidism: Secondary | ICD-10-CM

## 2019-03-22 ENCOUNTER — Telehealth: Payer: Self-pay | Admitting: Family Medicine

## 2019-03-22 NOTE — Telephone Encounter (Signed)
I left a message asking the pt to call and schedule AWV with Tiffany (virtual).

## 2019-03-31 ENCOUNTER — Other Ambulatory Visit: Payer: Self-pay | Admitting: Family Medicine

## 2019-03-31 DIAGNOSIS — E1121 Type 2 diabetes mellitus with diabetic nephropathy: Secondary | ICD-10-CM

## 2019-04-13 DIAGNOSIS — H40003 Preglaucoma, unspecified, bilateral: Secondary | ICD-10-CM | POA: Diagnosis not present

## 2019-04-16 ENCOUNTER — Other Ambulatory Visit: Payer: Self-pay | Admitting: Family Medicine

## 2019-04-16 DIAGNOSIS — E1169 Type 2 diabetes mellitus with other specified complication: Secondary | ICD-10-CM

## 2019-04-20 DIAGNOSIS — E119 Type 2 diabetes mellitus without complications: Secondary | ICD-10-CM | POA: Diagnosis not present

## 2019-04-20 LAB — HM DIABETES EYE EXAM

## 2019-04-26 ENCOUNTER — Encounter: Payer: Self-pay | Admitting: Family Medicine

## 2019-06-27 ENCOUNTER — Other Ambulatory Visit: Payer: Medicare Other

## 2019-06-27 ENCOUNTER — Other Ambulatory Visit: Payer: Self-pay | Admitting: Family Medicine

## 2019-06-27 DIAGNOSIS — E890 Postprocedural hypothyroidism: Secondary | ICD-10-CM | POA: Diagnosis not present

## 2019-06-27 DIAGNOSIS — E1121 Type 2 diabetes mellitus with diabetic nephropathy: Secondary | ICD-10-CM | POA: Diagnosis not present

## 2019-06-28 LAB — HEMOGLOBIN A1C
Hgb A1c MFr Bld: 6.4 % of total Hgb — ABNORMAL HIGH (ref <5.7)
Mean Plasma Glucose: 137 (calc)
eAG (mmol/L): 7.6 (calc)

## 2019-06-28 LAB — TSH: TSH: 0.03 mIU/L — ABNORMAL LOW (ref 0.40–4.50)

## 2019-06-28 LAB — T4, FREE: Free T4: 1.6 ng/dL (ref 0.8–1.8)

## 2019-07-02 ENCOUNTER — Other Ambulatory Visit: Payer: Self-pay | Admitting: Family Medicine

## 2019-07-02 ENCOUNTER — Other Ambulatory Visit: Payer: Self-pay

## 2019-07-02 ENCOUNTER — Encounter: Payer: Self-pay | Admitting: Family Medicine

## 2019-07-02 ENCOUNTER — Ambulatory Visit (INDEPENDENT_AMBULATORY_CARE_PROVIDER_SITE_OTHER): Payer: Medicare Other | Admitting: Family Medicine

## 2019-07-02 VITALS — BP 115/50 | HR 68 | Temp 97.5°F | Resp 16 | Ht 67.0 in | Wt 209.6 lb

## 2019-07-02 DIAGNOSIS — I129 Hypertensive chronic kidney disease with stage 1 through stage 4 chronic kidney disease, or unspecified chronic kidney disease: Secondary | ICD-10-CM

## 2019-07-02 DIAGNOSIS — E1169 Type 2 diabetes mellitus with other specified complication: Secondary | ICD-10-CM

## 2019-07-02 DIAGNOSIS — N1831 Chronic kidney disease, stage 3a: Secondary | ICD-10-CM

## 2019-07-02 DIAGNOSIS — E669 Obesity, unspecified: Secondary | ICD-10-CM

## 2019-07-02 DIAGNOSIS — E1121 Type 2 diabetes mellitus with diabetic nephropathy: Secondary | ICD-10-CM | POA: Diagnosis not present

## 2019-07-02 DIAGNOSIS — F3340 Major depressive disorder, recurrent, in remission, unspecified: Secondary | ICD-10-CM

## 2019-07-02 DIAGNOSIS — E785 Hyperlipidemia, unspecified: Secondary | ICD-10-CM

## 2019-07-02 DIAGNOSIS — E890 Postprocedural hypothyroidism: Secondary | ICD-10-CM

## 2019-07-02 DIAGNOSIS — Z9989 Dependence on other enabling machines and devices: Secondary | ICD-10-CM

## 2019-07-02 DIAGNOSIS — Z0000 Encounter for general adult medical examination without abnormal findings: Secondary | ICD-10-CM

## 2019-07-02 DIAGNOSIS — G4733 Obstructive sleep apnea (adult) (pediatric): Secondary | ICD-10-CM | POA: Diagnosis not present

## 2019-07-02 DIAGNOSIS — N183 Chronic kidney disease, stage 3 unspecified: Secondary | ICD-10-CM

## 2019-07-02 DIAGNOSIS — I25118 Atherosclerotic heart disease of native coronary artery with other forms of angina pectoris: Secondary | ICD-10-CM

## 2019-07-02 NOTE — Assessment & Plan Note (Signed)
Previously Improved Cr Secondary to DM HTN age Limit NSAID See A&P

## 2019-07-02 NOTE — Patient Instructions (Addendum)
Thank you for coming to the office today.  Recent Labs    08/10/18 0000 12/25/18 0953 06/27/19 0852  HGBA1C 7.7 6.5* 6.4*   As discussed, as long as Free T4 is normal range, keep same Synthroid dose.  Consider repeat Cologuard vs Colonoscopy 12/2019 or after.  For Mammogram screening for breast cancer   Call the [place] below anytime to schedule your own appointment now that order has been placed.  [facility] [place], [place] [address] Phone: [phone]  DUE for FASTING BLOOD WORK (no food or drink after midnight before the lab appointment, only water or coffee without cream/sugar on the morning of)  SCHEDULE "Lab Only" visit in the morning at the clinic for lab draw in 6 MONTHS   - Make sure Lab Only appointment is at about 1 week before your next appointment, so that results will be available  For Lab Results, once available within 2-3 days of blood draw, you can can log in to MyChart online to view your results and a brief explanation. Also, we can discuss results at next follow-up visit.   Please schedule a Follow-up Appointment to: Return in about 6 months (around 01/02/2020) for Annual Physical.  If you have any other questions or concerns, please feel free to call the office or send a message through [place]. You may also schedule an earlier appointment if necessary.  Additionally, you may be receiving a survey about your experience at our office within a few days to 1 week by e-mail or mail. We value your feedback.  Nobie Putnam, DO [place]

## 2019-07-02 NOTE — Assessment & Plan Note (Signed)
Well controlled A1c down to 6.4, still improving, lifestyle and weight loss on GLP1 Complications - CKD-III Hyperlipidemia, Hypothyroidism Prior meds: Januvia, insulin  Plan:  1. CONTINUE Ozempic 0.5mg  weekly inj - no plan to increase dose - Continue Metformin XR 500mg  x 3 daily - offer to reduce, she defers, only if A1c < 6 2. Encourage improved lifestyle - low carb, low sugar diet, reduce portion size, continue improving regular exercise 3. Check CBG, bring log to next visit for review 4. Continue ASA, ARB, Statin UTD DM Eye 04/2019  6 mo, Annual + A1c

## 2019-07-02 NOTE — Assessment & Plan Note (Signed)
Asymptomatic. Controlled Mild low TSH, but normal Free T4 1.6 Continue current Levothyroxine 147mcg daily for now - discussed options, agree to not change yet Follow-up 6 months with labs thyroid physical

## 2019-07-02 NOTE — Progress Notes (Signed)
Subjective:    Patient ID: [name], female    DOB: 04-13-1945, 74 y.o.   MRN: [id number]  [name] is a 74 y.o. female presenting on 07/02/2019 for Diabetes   HPI   CHRONIC DM, Type 2 with CKD-III/ Hypertension Last lab A1c 6.4, 05/2019 She is doing well overall still on Ozempic 0.5mg , has had good weight loss CBGs:stablemostlyAvg100-120,no low.High < 150. Checks CBGs x1 fasting AM Meds: - Metformin XR 1500mg  daily x 3 in AM - Ozempic 0.5mg  weekly inj- minimal nausea occasionally - Losartan 100mg  daily, Carvedilol 3.125mg  BID, Amlodipine 2.5mg  daily Reports good compliance, tolerating well Currently on ARB, ASA 81, Statin Lifestyle: - Weight down 10 lbs in 5-6 months - Diet(improving diet) - Exercise (improved regular exercise) - UTD DM Eye Exam 04/2019 Denies hypoglycemia, numbness tingling weakness   FOLLOW-UPHYPOTHYROIDISM, S/p surgical thyroidectomy Still low TSH but normal Free T4 (1.6, 05/2019) similar to prior discussion she cannot feel difference wants to keep dose. - On Levothyroxine 153mcg daily   OSA, on CPAP - Patient reports prior history of dx OSA and on CPAP for >10 yr - Today reports that sleep apnea is well controlled. uses the CPAP machine every night. Tolerates the machine well, and thinks that sleeps better with it and feels good. No new concerns or symptoms.  Depression, mild chronic recurrent - In remission See PHQ score Currently doing well. occasional episode of feeling emotional crying spell adjusting to husband dementia but overall very manageable Continues on SSRI, Fluoxetine 59mng daily   Health Maintenance:  Ordered Mammogram screening, patient to schedule. Last mammogram 09/2016, she will schedule.  Due for Shingrix vaccine.  UTD COVID19 vaccine completed [place] 05/23/19 2nd dose.  Colon CA Screening - last cologuard 01/03/17 (negative), repeat due in 3 years, approximately 12/2019    Depression screen [facility]  2/9 07/02/2019 01/01/2019 07/06/2018  Decreased Interest 0 0 0  Down, Depressed, Hopeless 0 0 0  PHQ - 2 Score 0 0 0  Altered sleeping 1 1 1   Tired, decreased energy 1 1 1   Change in appetite 0 0 0  Feeling bad or failure about yourself  0 0 0  Trouble concentrating 0 0 0  Moving slowly or fidgety/restless 0 0 0  Suicidal thoughts 0 0 0  PHQ-9 Score 2 2 2   Difficult doing work/chores Not difficult at all Not difficult at all Not difficult at all   No flowsheet data found.    Social History   Tobacco Use  . Smoking status: Never Smoker  . Smokeless tobacco: Never Used  Substance Use Topics  . Alcohol use: No  . Drug use: No    Review of Systems Per HPI unless specifically indicated above     Objective:    BP (!) 115/50   Pulse 68   Temp (!) 97.5 F (36.4 C) (Temporal)   Resp 16   Ht 5\' 7"  (1.702 m)   Wt 209 lb 9.6 oz (95.1 kg)   BMI 32.83 kg/m   Wt Readings from Last 3 Encounters:  07/02/19 209 lb 9.6 oz (95.1 kg)  01/24/19 220 lb 4 oz (99.9 kg)  01/01/19 220 lb (99.8 kg)    Physical Exam Vitals and nursing note reviewed.  Constitutional:      General: She is not in acute distress.    Appearance: She is well-developed. She is not diaphoretic.     Comments: Well-appearing, comfortable, cooperative  HENT:     Head: Normocephalic and atraumatic.  Eyes:     General:        Right eye: No discharge.        Left eye: No discharge.     Conjunctiva/sclera: Conjunctivae normal.  Cardiovascular:     Rate and Rhythm: Normal rate.  Pulmonary:     Effort: Pulmonary effort is normal.  Skin:    General: Skin is warm and dry.     Findings: No erythema or rash.  Neurological:     Mental Status: She is alert and oriented to person, place, and time.  Psychiatric:        Behavior: Behavior normal.     Comments: Well groomed, good eye contact, normal speech and thoughts      Recent Labs    08/10/18 0000 12/25/18 0953 06/27/19 0852  HGBA1C 7.7 6.5* 6.4*     Results for orders placed or performed in visit on 06/27/19  T4, free  Result Value Ref Range   Free T4 1.6 0.8 - 1.8 ng/dL  TSH  Result Value Ref Range   TSH 0.03 (L) 0.40 - 4.50 mIU/L  Hemoglobin A1c  Result Value Ref Range   Hgb A1c MFr Bld 6.4 (H) <5.7 % of total Hgb   Mean Plasma Glucose 137 (calc)   eAG (mmol/L) 7.6 (calc)      Assessment & Plan:   Problem List Items Addressed This Visit    Obstructive sleep apnea on CPAP    Well controlled, chronic OSA on CPAP, now >10 years - Last PSG [facility] - Good adherence to CPAP nightly - Continue current CPAP therapy, patient seems to be benefiting from therapy      Obesity (BMI 30.0-34.9)    Improved weight loss Encourage lifestyle diet / exercise regimen on GLP1      Major depressive disorder, recurrent, in remission ([place])    Stable, controlled on SSRI low dose FLuoxetine 10mg  daily PHQ 2, unchanged Has occasional emotional episodes with husband's declining health but feels overall doing well Follow-up as needed      Hypothyroidism, postop    Asymptomatic. Controlled Mild low TSH, but normal Free T4 1.6 Continue current Levothyroxine 147mcg daily for now - discussed options, agree to not change yet Follow-up 6 months with labs thyroid physical      Controlled type 2 diabetes mellitus with diabetic nephropathy ([place]) - Primary    Well controlled A1c down to 6.4, still improving, lifestyle and weight loss on GLP1 Complications - CKD-III Hyperlipidemia, Hypothyroidism Prior meds: Januvia, insulin  Plan:  1. CONTINUE Ozempic 0.5mg  weekly inj - no plan to increase dose - Continue Metformin XR 500mg  x 3 daily - offer to reduce, she defers, only if A1c < 6 2. Encourage improved lifestyle - low carb, low sugar diet, reduce portion size, continue improving regular exercise 3. Check CBG, bring log to next visit for review 4. Continue ASA, ARB, Statin UTD DM Eye 04/2019  6 mo, Annual + A1c      CKD (chronic  kidney disease), stage III    Previously Improved Cr Secondary to DM HTN age Limit NSAID See A&P      Benign hypertension with CKD (chronic kidney disease) stage III    Well-controlled HTN Complication with CKD-III   Plan:  1. Continue current BP regimen Losartan 100mg  daily, Carvedilol 3.125mg  BID, Amlodipine 2.5mg  daily 2. Encourage improved lifestyle - low sodium diet, regular exercise 3. Continue monitor BP outside office, bring readings to next visit, if persistently >140/90 or new  symptoms notify office sooner         No orders of the defined types were placed in this encounter.    Follow up plan: Return in about 6 months (around 01/02/2020) for Annual Physical.  Future labs ordered for 12/26/19  Nobie Putnam, [facility] 07/02/2019, 9:15 AM

## 2019-07-02 NOTE — Assessment & Plan Note (Signed)
Stable, controlled on SSRI low dose FLuoxetine 10mg  daily PHQ 2, unchanged Has occasional emotional episodes with husband's declining health but feels overall doing well Follow-up as needed

## 2019-07-02 NOTE — Assessment & Plan Note (Signed)
Well controlled, chronic OSA on CPAP, now >10 years - Last PSG [facility] - Good adherence to CPAP nightly - Continue current CPAP therapy, patient seems to be benefiting from therapy

## 2019-07-02 NOTE — Assessment & Plan Note (Signed)
Well-controlled HTN Complication with CKD-III   Plan:  1. Continue current BP regimen Losartan 100mg daily, Carvedilol 3.125mg BID, Amlodipine 2.5mg daily 2. Encourage improved lifestyle - low sodium diet, regular exercise 3. Continue monitor BP outside office, bring readings to next visit, if persistently >140/90 or new symptoms notify office sooner 

## 2019-07-02 NOTE — Assessment & Plan Note (Signed)
Improved weight loss Encourage lifestyle diet / exercise regimen on GLP1

## 2019-07-20 ENCOUNTER — Ambulatory Visit: Payer: Medicare Other | Admitting: Pharmacist

## 2019-07-20 DIAGNOSIS — N183 Chronic kidney disease, stage 3 unspecified: Secondary | ICD-10-CM

## 2019-07-20 DIAGNOSIS — I129 Hypertensive chronic kidney disease with stage 1 through stage 4 chronic kidney disease, or unspecified chronic kidney disease: Secondary | ICD-10-CM

## 2019-07-20 NOTE — Patient Instructions (Signed)
Thank you allowing the Chronic Care Management Team to be a part of your care! It was a pleasure speaking with you today!     CCM (Chronic Care Management) Team    Noreene Larsson RN, MSN, CCM Nurse Care Coordinator  [phone]   Harlow Asa PharmD  Clinical Pharmacist  [phone]   Eula Fried LCSW Clinical Social Worker [phone]  Visit Information  Goals Addressed            This Visit's Progress   . PharmD - Medication Adherence       CARE PLAN ENTRY (see longitudinal plan of care for additional care plan information)   Current Barriers:  . Chronic Disease Management support, education, and care coordination needs related to HTN, CAD, T2DM . Medication Adherence gap as identified by health plan  Pharmacist Clinical Goal(s):  Marland Kitchen Over the next 1 days, patient will work with CM Pharmacist to address needs related to medication adherence  Interventions: . Assess adherence to losartan 100 mg once daily (as identified by health plan).  o Patient denies barriers to taking losartan. Reports using weekly pillboxes as adherence aid o Review dispensing history in chart. No gaps in recent refill history noted. o Encourage patient to continue adherence to medication and use of weekly pillbox . Patient reports often takes carvedilol once daily rather than twice daily as difficult to take evening dose o Counsel on rational for taking carvedilol twice daily and discuss methods to aid with adherence to taking evening dose. o Note carvedilol comes in extended release formulation, but not covered on current health plan formulary o Denies current home monitoring of blood pressure . Patient denies further medication questions/concerns at this time.  Patient Self Care Activities:  . Attends all scheduled provider appointments  Initial goal documentation        Patient verbalizes understanding of instructions provided today.   The patient has been advised to call with  any medication related questions or concerns.   Harlow Asa, PharmD, [facility] [employer] [phone]

## 2019-07-20 NOTE — Chronic Care Management (AMB) (Signed)
Chronic Care Management   Follow Up Note   07/20/2019 Name: MASSIEL SAMUELS MRN: [id number] DOB: 1945-11-30  Referred by: Olin Hauser, DO Reason for referral : Chronic Care Management (Patient Phone Call)   [name] is a 74 y.o. year old female who is a primary care patient of Olin Hauser, DO. The CCM team was consulted for assistance with chronic disease management and care coordination needs as identified by health plan.  I reached out to Modena Nunnery by phone today.   Review of patient status, including review of consultants reports, relevant laboratory and other test results, and collaboration with appropriate care team members and the patient's provider was performed as part of comprehensive patient evaluation and provision of chronic care management services.    Objective  BP Readings from Last 3 Encounters:  07/02/19 (!) 115/50  01/24/19 138/62  01/01/19 117/65    Outpatient Encounter Medications as of 07/20/2019  Medication Sig Note  . amLODipine (NORVASC) 2.5 MG tablet TAKE 1 TABLET BY MOUTH  DAILY   . losartan (COZAAR) 100 MG tablet TAKE 1 TABLET BY MOUTH  DAILY   . ACCU-CHEK AVIVA PLUS test strip U UTD BID   . acetaminophen (TYLENOL) 500 MG tablet Take 2 tablets by mouth 3 (three) times daily as needed. 07/11/2014: Medication taken as needed. OTC Received from: [place]:   . aspirin 81 MG tablet Take 81 mg by mouth daily.   . carvedilol (COREG) 3.125 MG tablet TAKE 1 TABLET BY MOUTH TWO  TIMES DAILY   . ezetimibe (ZETIA) 10 MG tablet TAKE 1 TABLET BY MOUTH  DAILY   . FLUoxetine (PROZAC) 10 MG capsule TAKE 1 CAPSULE BY MOUTH  DAILY   . fluticasone (FLONASE) 50 MCG/ACT nasal spray Place 2 sprays into both nostrils daily.   . Insulin Pen Needle (NOVOFINE PLUS) 32G X 4 MM MISC Use with Ozempic weekly injection as instructed   . levothyroxine (SYNTHROID) 125 MCG tablet TAKE 1 TABLET BY MOUTH  DAILY  BEFORE BREAKFAST   . metFORMIN (GLUCOPHAGE-XR) 500 MG 24 hr tablet TAKE 3 TABLETS BY MOUTH  DAILY WITH BREAKFAST   . nitroGLYCERIN (NITROSTAT) 0.4 MG SL tablet Place 1 tablet (0.4 mg total) under the tongue every 5 (five) minutes as needed for chest pain. 08/30/2016: PRN, has never had to use   . OZEMPIC, 0.25 OR 0.5 MG/DOSE, 2 MG/1.5ML SOPN INJECT 0.5MG  SUBCUTANEOUSLY ONCE A WEEK AS DIRECTED   . rosuvastatin (CRESTOR) 20 MG tablet TAKE 1 TABLET BY MOUTH  DAILY    No facility-administered encounter medications on file as of 07/20/2019.    Goals Addressed            This Visit's Progress   . PharmD - Medication Adherence       CARE PLAN ENTRY (see longitudinal plan of care for additional care plan information)   Current Barriers:  . Chronic Disease Management support, education, and care coordination needs related to HTN, CAD, T2DM . Medication Adherence gap as identified by health plan  Pharmacist Clinical Goal(s):  Marland Kitchen Over the next 1 days, patient will work with CM Pharmacist to address needs related to medication adherence  Interventions: . Assess adherence to losartan 100 mg once daily (as identified by health plan).  o Patient denies barriers to taking losartan. Reports using weekly pillboxes as adherence aid o Review dispensing history in chart. No gaps in recent refill history noted. o Encourage patient to continue adherence  to medication and use of weekly pillbox . Patient reports often takes carvedilol once daily rather than twice daily as difficult to take evening dose o Counsel on rational for taking carvedilol twice daily and discuss methods to aid with adherence to taking evening dose. o Note carvedilol comes in extended release formulation, but not covered on current health plan formulary o Denies current home monitoring of blood pressure . Patient denies further medication questions/concerns at this time.  Patient Self Care Activities:  . Attends all scheduled  provider appointments  Initial goal documentation        Plan  1) CM Pharmacist will not schedule further outreach at this time as patient denies any further medication question/concerns at this time 2) The patient has been advised to call with any medication related questions or concerns.   Harlow Asa, PharmD, [place] [employer] [phone]

## 2019-09-21 ENCOUNTER — Other Ambulatory Visit: Payer: Self-pay | Admitting: Cardiovascular Disease

## 2019-09-21 ENCOUNTER — Other Ambulatory Visit: Payer: Self-pay | Admitting: Family Medicine

## 2019-09-21 DIAGNOSIS — E1121 Type 2 diabetes mellitus with diabetic nephropathy: Secondary | ICD-10-CM

## 2019-09-21 DIAGNOSIS — I10 Essential (primary) hypertension: Secondary | ICD-10-CM

## 2019-10-07 ENCOUNTER — Other Ambulatory Visit: Payer: Self-pay | Admitting: Cardiovascular Disease

## 2019-10-18 DIAGNOSIS — H40003 Preglaucoma, unspecified, bilateral: Secondary | ICD-10-CM | POA: Diagnosis not present

## 2019-10-22 ENCOUNTER — Other Ambulatory Visit: Payer: Self-pay | Admitting: Family Medicine

## 2019-10-22 NOTE — Telephone Encounter (Signed)
Requested medications are due for refill today? Yes  Requested medications are on active medication list?  Yes   Last Refill:   08/29/2018  # 90 with 3 refills   Future visit scheduled? Yes  Notes to Clinic:  Medication failed RX refill protocol due to no labs within past 180 days.  Patient had a valid encounter 3 months ago.

## 2019-11-27 ENCOUNTER — Other Ambulatory Visit: Payer: Self-pay | Admitting: Family Medicine

## 2019-11-27 DIAGNOSIS — F32A Depression, unspecified: Secondary | ICD-10-CM

## 2019-11-27 DIAGNOSIS — I10 Essential (primary) hypertension: Secondary | ICD-10-CM

## 2019-11-28 NOTE — Telephone Encounter (Signed)
Requested Prescriptions  Pending Prescriptions Disp Refills  . FLUoxetine (PROZAC) 10 MG capsule [Pharmacy Med Name: FLUOXETINE  10MG   CAP] 90 capsule 0    Sig: TAKE 1 CAPSULE BY MOUTH  DAILY     Psychiatry:  Antidepressants - SSRI Failed - 11/27/2019 10:05 PM      Failed - Valid encounter within last 6 months    Recent Outpatient Visits          4 months ago Controlled type 2 diabetes mellitus with diabetic nephropathy, without long-term current use of insulin ([place])   [facility], Devonne Doughty, DO   11 months ago Annual physical exam   [facility], Devonne Doughty, DO   1 year ago Controlled type 2 diabetes mellitus with diabetic nephropathy, without long-term current use of insulin ([place])   [place], DO   1 year ago Annual physical exam   [facility] Olin Hauser, DO   2 years ago Controlled type 2 diabetes mellitus with diabetic nephropathy, without long-term current use of insulin ([place])   [place], DO      Future Appointments            In 1 month [place], Devonne Doughty, DO [facility], [place]   In 2 months G[place], Kathlene November, MD [id number]           Passed - Completed PHQ-2 or PHQ-9 in the last 360 days.      Marland Kitchen amLODipine (NORVASC) 2.5 MG tablet [Pharmacy Med Name: amLODIPine [place] 2.5 MG Oral Tablet] 90 tablet 3    Sig: TAKE 1 TABLET BY MOUTH  DAILY     Cardiovascular:  Calcium Channel Blockers Failed - 11/27/2019 10:05 PM      Failed - Valid encounter within last 6 months    Recent Outpatient Visits          4 months ago Controlled type 2 diabetes mellitus with diabetic nephropathy, without long-term current use of insulin ([place])   [facility], Devonne Doughty, DO   11 months ago Annual physical exam   [facility]  Olin Hauser, DO   1 year ago Controlled type 2 diabetes mellitus with diabetic nephropathy, without long-term current use of insulin ([place])   [place], DO   1 year ago Annual physical exam   [facility] Olin Hauser, DO   2 years ago Controlled type 2 diabetes mellitus with diabetic nephropathy, without long-term current use of insulin ([place])   [place], DO      Future Appointments            In 1 month [place], Devonne Doughty, DO [facility], [place]   In 2 months Gollan, Kathlene November, MD [facility], [place] BP in normal range    BP Readings from Last 1 Encounters:  07/02/19 (!) 115/50

## 2019-12-03 ENCOUNTER — Other Ambulatory Visit: Payer: Self-pay | Admitting: Cardiovascular Disease

## 2019-12-15 ENCOUNTER — Other Ambulatory Visit: Payer: Self-pay | Admitting: Family Medicine

## 2019-12-15 DIAGNOSIS — E890 Postprocedural hypothyroidism: Secondary | ICD-10-CM

## 2019-12-15 NOTE — Telephone Encounter (Signed)
Requested medications are due for refill today?  Yes   Requested medications are on active medication list?  Yes  Last Refill:   01/28/2020  # 90 with 3 refills   Future visit scheduled?  Yes in 2 weeks.    Notes to Clinic:  Medication failed Rx refill protocol due to no TSH level 3 months after an abnormal value.  Please review.

## 2019-12-25 ENCOUNTER — Other Ambulatory Visit: Payer: Self-pay | Admitting: *Deleted

## 2019-12-25 DIAGNOSIS — E1121 Type 2 diabetes mellitus with diabetic nephropathy: Secondary | ICD-10-CM

## 2019-12-25 DIAGNOSIS — Z9989 Dependence on other enabling machines and devices: Secondary | ICD-10-CM

## 2019-12-25 DIAGNOSIS — E785 Hyperlipidemia, unspecified: Secondary | ICD-10-CM

## 2019-12-25 DIAGNOSIS — Z0000 Encounter for general adult medical examination without abnormal findings: Secondary | ICD-10-CM

## 2019-12-25 DIAGNOSIS — F3340 Major depressive disorder, recurrent, in remission, unspecified: Secondary | ICD-10-CM

## 2019-12-25 DIAGNOSIS — G4733 Obstructive sleep apnea (adult) (pediatric): Secondary | ICD-10-CM

## 2019-12-25 DIAGNOSIS — E1169 Type 2 diabetes mellitus with other specified complication: Secondary | ICD-10-CM

## 2019-12-25 DIAGNOSIS — N1831 Chronic kidney disease, stage 3a: Secondary | ICD-10-CM

## 2019-12-25 DIAGNOSIS — I129 Hypertensive chronic kidney disease with stage 1 through stage 4 chronic kidney disease, or unspecified chronic kidney disease: Secondary | ICD-10-CM

## 2019-12-25 DIAGNOSIS — E890 Postprocedural hypothyroidism: Secondary | ICD-10-CM

## 2019-12-25 DIAGNOSIS — I25118 Atherosclerotic heart disease of native coronary artery with other forms of angina pectoris: Secondary | ICD-10-CM

## 2019-12-26 ENCOUNTER — Other Ambulatory Visit: Payer: Self-pay

## 2019-12-26 ENCOUNTER — Other Ambulatory Visit: Payer: Medicare Other

## 2019-12-26 DIAGNOSIS — E890 Postprocedural hypothyroidism: Secondary | ICD-10-CM | POA: Diagnosis not present

## 2019-12-26 DIAGNOSIS — E1169 Type 2 diabetes mellitus with other specified complication: Secondary | ICD-10-CM | POA: Diagnosis not present

## 2019-12-26 DIAGNOSIS — I129 Hypertensive chronic kidney disease with stage 1 through stage 4 chronic kidney disease, or unspecified chronic kidney disease: Secondary | ICD-10-CM | POA: Diagnosis not present

## 2019-12-26 DIAGNOSIS — E1121 Type 2 diabetes mellitus with diabetic nephropathy: Secondary | ICD-10-CM | POA: Diagnosis not present

## 2019-12-26 DIAGNOSIS — E785 Hyperlipidemia, unspecified: Secondary | ICD-10-CM | POA: Diagnosis not present

## 2019-12-27 LAB — COMPLETE METABOLIC PANEL WITH GFR
AG Ratio: 1.2 (calc) (ref 1.0–2.5)
ALT: 8 U/L (ref 6–29)
AST: 13 U/L (ref 10–35)
Albumin: 3.6 g/dL (ref 3.6–5.1)
Alkaline phosphatase (APISO): 62 U/L (ref 37–153)
BUN/Creatinine Ratio: 18 (calc) (ref 6–22)
BUN: 19 mg/dL (ref 7–25)
CO2: 25 mmol/L (ref 20–32)
Calcium: 8.7 mg/dL (ref 8.6–10.4)
Chloride: 107 mmol/L (ref 98–110)
Creat: 1.03 mg/dL — ABNORMAL HIGH (ref 0.60–0.93)
GFR, Est African American: 62 mL/min/{1.73_m2} (ref >=60)
GFR, Est Non African American: 54 mL/min/{1.73_m2} — ABNORMAL LOW (ref >=60)
Globulin: 3 g/dL (calc) (ref 1.9–3.7)
Glucose, Bld: 120 mg/dL — ABNORMAL HIGH (ref 65–99)
Potassium: 4.6 mmol/L (ref 3.5–5.3)
Sodium: 139 mmol/L (ref 135–146)
Total Bilirubin: 0.5 mg/dL (ref 0.2–1.2)
Total Protein: 6.6 g/dL (ref 6.1–8.1)

## 2019-12-27 LAB — TSH: TSH: 0.06 mIU/L — ABNORMAL LOW (ref 0.40–4.50)

## 2019-12-27 LAB — CBC WITH DIFFERENTIAL/PLATELET
Absolute Monocytes: 580 cells/uL (ref 200–950)
Basophils Absolute: 43 cells/uL (ref 0–200)
Basophils Relative: 0.7 %
Eosinophils Absolute: 360 cells/uL (ref 15–500)
Eosinophils Relative: 5.9 %
HCT: 33.7 % — ABNORMAL LOW (ref 35.0–45.0)
Hemoglobin: 10.9 g/dL — ABNORMAL LOW (ref 11.7–15.5)
Lymphs Abs: 1958 cells/uL (ref 850–3900)
MCH: 30.9 pg (ref 27.0–33.0)
MCHC: 32.3 g/dL (ref 32.0–36.0)
MCV: 95.5 fL (ref 80.0–100.0)
MPV: 10.4 fL (ref 7.5–12.5)
Monocytes Relative: 9.5 %
Neutro Abs: 3160 cells/uL (ref 1500–7800)
Neutrophils Relative %: 51.8 %
Platelets: 214 10*3/uL (ref 140–400)
RBC: 3.53 10*6/uL — ABNORMAL LOW (ref 3.80–5.10)
RDW: 11.7 % (ref 11.0–15.0)
Total Lymphocyte: 32.1 %
WBC: 6.1 10*3/uL (ref 3.8–10.8)

## 2019-12-27 LAB — LIPID PANEL
Cholesterol: 115 mg/dL (ref <200)
HDL: 57 mg/dL (ref >=50)
LDL Cholesterol (Calc): 43 mg/dL (calc)
Non-HDL Cholesterol (Calc): 58 mg/dL (calc) (ref <130)
Total CHOL/HDL Ratio: 2 (calc) (ref <5.0)
Triglycerides: 71 mg/dL (ref <150)

## 2019-12-27 LAB — HEMOGLOBIN A1C
Hgb A1c MFr Bld: 6.6 % of total Hgb — ABNORMAL HIGH (ref <5.7)
Mean Plasma Glucose: 143 (calc)
eAG (mmol/L): 7.9 (calc)

## 2019-12-27 LAB — T4, FREE: Free T4: 1.4 ng/dL (ref 0.8–1.8)

## 2020-01-02 ENCOUNTER — Other Ambulatory Visit: Payer: Self-pay

## 2020-01-02 ENCOUNTER — Ambulatory Visit (INDEPENDENT_AMBULATORY_CARE_PROVIDER_SITE_OTHER): Payer: Medicare Other | Admitting: Family Medicine

## 2020-01-02 ENCOUNTER — Encounter: Payer: Self-pay | Admitting: Family Medicine

## 2020-01-02 VITALS — BP 125/49 | HR 57 | Temp 97.3°F | Resp 16 | Ht 67.0 in | Wt 214.0 lb

## 2020-01-02 DIAGNOSIS — F3340 Major depressive disorder, recurrent, in remission, unspecified: Secondary | ICD-10-CM

## 2020-01-02 DIAGNOSIS — E1121 Type 2 diabetes mellitus with diabetic nephropathy: Secondary | ICD-10-CM

## 2020-01-02 DIAGNOSIS — Z1231 Encounter for screening mammogram for malignant neoplasm of breast: Secondary | ICD-10-CM | POA: Diagnosis not present

## 2020-01-02 DIAGNOSIS — I129 Hypertensive chronic kidney disease with stage 1 through stage 4 chronic kidney disease, or unspecified chronic kidney disease: Secondary | ICD-10-CM

## 2020-01-02 DIAGNOSIS — Z1211 Encounter for screening for malignant neoplasm of colon: Secondary | ICD-10-CM | POA: Diagnosis not present

## 2020-01-02 DIAGNOSIS — E785 Hyperlipidemia, unspecified: Secondary | ICD-10-CM

## 2020-01-02 DIAGNOSIS — Z0000 Encounter for general adult medical examination without abnormal findings: Secondary | ICD-10-CM

## 2020-01-02 DIAGNOSIS — N183 Chronic kidney disease, stage 3 unspecified: Secondary | ICD-10-CM

## 2020-01-02 DIAGNOSIS — N1831 Chronic kidney disease, stage 3a: Secondary | ICD-10-CM

## 2020-01-02 DIAGNOSIS — E1169 Type 2 diabetes mellitus with other specified complication: Secondary | ICD-10-CM

## 2020-01-02 NOTE — Patient Instructions (Addendum)
Thank you for coming to the office today.  For Mammogram screening for breast cancer   Call the [place] below anytime to schedule your own appointment now that order has been placed.  [facility] [place], [place] [address] Phone: [phone]  Go ahead and increase Fluoxetine from 10mg  to 20mg  - take 2 pills instead of one, notify me in 4-6 weeks if this is is working and need new order 20mg  or what to do next.  ------------  Ask Dr Rockey Situ about Aspirin, may hold in future if still low anemia iron.  Try to improve iron rich diet. May consider multi vitamin with iron, would avoid pure iron supplements for now.  Cologuard ordered.  Future reconsider Colonoscopy if this problem worsens.  DUE for FASTING BLOOD WORK (no food or drink after midnight before the lab appointment, only water or coffee without cream/sugar on the morning of)  SCHEDULE "Lab Only" visit in the morning at the clinic for lab draw in 6 MONTHS   - Make sure Lab Only appointment is at about 1 week before your next appointment, so that results will be available  For Lab Results, once available within 2-3 days of blood draw, you can can log in to MyChart online to view your results and a brief explanation. Also, we can discuss results at next follow-up visit.   Please schedule a Follow-up Appointment to: Return in about 6 months (around 07/01/2020) for 6 month DM A1c, Iron/Anemia, Thyroid.  If you have any other questions or concerns, please feel free to call the office or send a message through [place]. You may also schedule an earlier appointment if necessary.  Additionally, you may be receiving a survey about your experience at our office within a few days to 1 week by e-mail or mail. We value your feedback.  Nobie Putnam, DO [place]

## 2020-01-02 NOTE — Progress Notes (Signed)
Subjective:    Patient ID: [name], female    DOB: 1945-06-21, 74 y.o.   MRN: [id number]  [name] is a 74 y.o. female presenting on 01/02/2020 for Annual Exam   HPI   Here for Annual Physical and Lab Review.  CHRONIC DM, Type 2 with CKD-III/ Hypertension Last lab A1c 6.4, 05/2019 She is doing well overall still on Ozempic 0.5mg , has had good weight loss CBGs:stablemostlyAvg100-120,no low.High < 150. Checks CBGs x1 fasting AM Meds: - Metformin XR 1500mg  daily x 3 in AM - Ozempic 0.5mg  weekly inj- minimal nausea occasionally - Losartan 100mg  daily, Carvedilol 3.125mg  BID, Amlodipine 2.5mg  daily Reports good compliance, tolerating well Currently on ARB, ASA 81, Statin Lifestyle: - Weight down 10 lbs in 5-6 months - Diet(improving diet) - Exercise (improved regular exercise) - UTD DM Eye Exam 04/2019 Denies hypoglycemia, numbness tingling weakness   FOLLOW-UPHYPOTHYROIDISM, S/p surgical thyroidectomy Still low TSH but normal Free T4 (1.6, 05/2019) similar to prior discussion she cannot feel difference wants to keep dose. - On Levothyroxine 166mcg daily   OSA, on CPAP - Patient reports prior history of dx OSA and on CPAP for >10 yr - Today reports that sleep apnea is well controlled. uses the CPAP machine every night. Tolerates the machine well, and thinks that sleeps better with it and feels good. No new concerns or symptoms.  Depression, mild chronic recurrent - In remission See PHQ score Currently doing well. occasional episode of feeling emotional crying spell adjusting to husband dementia but overall very manageable Continues on SSRI, Fluoxetine 58mng daily  HLD / CAD / HTN heart disease Followed by Cards Last visit cardiology added Zetia to her statin, rosuvastatin 20mg . Doing well on these with controlled Lipid results. She takes Aspirin 81mg  daily  Anemia, normocytic Last lab showed Mild anemia Hgb 10.9, normal MCV, prior results  11-12 range normal Last cologuard 2018 negative, she is due today, declines colonoscopy No bleeding. She is on ASA 81.  OSA, on CPAP - Patient reports prior history of dx OSA and on CPAP for >10 yr - Today reports that sleep apnea is well controlled. uses the CPAP machine every night. Tolerates the machine well, and thinks that sleeps better with it and feels good. No new concerns or symptoms.  Depression, mild chronic recurrent - In remission See PHQ score Currently doing well. occasional episode of feeling emotional crying spell adjusting to husband dementia but overall very manageable Continues on SSRI, Fluoxetine 2mng daily   Health Maintenance: UTD Flu updated 12/14/19 UTD COVID19 Booster, [place] 10/2019 Due for Mammogram, last 2018 - ordered today, to be scheduled.  Cologuard negative 01/03/2017, now due for repeat before age 57. She declines colonoscopy at this time.  Depression screen [facility] 2/9 01/02/2020 07/02/2019 01/01/2019  Decreased Interest 0 0 0  Down, Depressed, Hopeless 0 0 0  PHQ - 2 Score 0 0 0  Altered sleeping 1 1 1   Tired, decreased energy 1 1 1   Change in appetite 0 0 0  Feeling bad or failure about yourself  0 0 0  Trouble concentrating 0 0 0  Moving slowly or fidgety/restless 0 0 0  Suicidal thoughts 0 0 0  PHQ-9 Score 2 2 2   Difficult doing work/chores Not difficult at all Not difficult at all Not difficult at all  Some recent data might be hidden   GAD 7 : Generalized Anxiety Score 01/02/2020  Nervous, Anxious, on Edge 1  Control/stop worrying 0  Worry too  much - different things 0  Trouble relaxing 0  Restless 0  Easily annoyed or irritable 0  Afraid - awful might happen 0  Total GAD 7 Score 1  Anxiety Difficulty Not difficult at all      Past Medical History:  Diagnosis Date  . Arthritis    knees   . Coronary artery disease    a. 07/2013 STEMI/PCI ([place]): LM nl, LAD 100p (thrombectomy & 3.5x23 Alpine DES), LCX nl, RCA nl, EF 45%.  .  Endometrial cancer ([place])    THYROID CANCER  . Gravida 3 para 3   . Hyperlipidemia   . Hypertension   . Hypertensive heart disease   . Hypothyroid   . Ischemic cardiomyopathy    a. 07/2013 EF 45% @ time of STEMI; b. 11/2013 Echo: EF 55-60%, mildly dil LA, nl RV fxn.  . Menopause   . Myocardial infarction ([place])    2016  . Obstructive sleep apnea on CPAP   . Osteoarthritis    Past Surgical History:  Procedure Laterality Date  . ABDOMINAL HYSTERECTOMY    . CARDIAC CATHETERIZATION    . CATARACT EXTRACTION  08/30/2016  . CATARACT EXTRACTION W/PHACO Left 08/30/2016   Procedure: CATARACT EXTRACTION PHACO AND INTRAOCULAR LENS PLACEMENT ([place]) Left daibetic;  Surgeon: Leandrew Koyanagi, MD;  Location: [facility];  Service: Ophthalmology;  Laterality: Left;  Diabetic - insulin and oral meds sleep apnea  . CATARACT EXTRACTION W/PHACO Right 10/07/2016   Procedure: CATARACT EXTRACTION PHACO AND INTRAOCULAR LENS PLACEMENT ([facility]);  Surgeon: Leandrew Koyanagi, MD;  Location: [facility];  Service: Ophthalmology;  Laterality: Right;  Lot# [id number] H [place]: 00:43.3 AP%: 12.2 CDE: 5.28  . CORONARY ANGIOPLASTY     drug eluting stent placed at [place]    . LAPAROSCOPIC HYSTERECTOMY    . THYROIDECTOMY     Social History   Socioeconomic History  . Marital status: Married    Spouse name: Not on file  . Number of children: Not on file  . Years of education: Not on file  . Highest education level: Not on file  Occupational History  . Not on file  Tobacco Use  . Smoking status: Never Smoker  . Smokeless tobacco: Never Used  Substance and Sexual Activity  . Alcohol use: No  . Drug use: No  . Sexual activity: Not on file  Other Topics Concern  . Not on file  Social History Narrative  . Not on file   Social Determinants of Health   Financial Resource Strain:   . Difficulty of Paying Living Expenses: Not on file  Food Insecurity:   . Worried About  Charity fundraiser in the Last Year: Not on file  . Ran Out of Food in the Last Year: Not on file  Transportation Needs:   . Lack of Transportation (Medical): Not on file  . Lack of Transportation (Non-Medical): Not on file  Physical Activity:   . Days of Exercise per Week: Not on file  . Minutes of Exercise per Session: Not on file  Stress:   . Feeling of Stress : Not on file  Social Connections:   . Frequency of Communication with Friends and Family: Not on file  . Frequency of Social Gatherings with Friends and Family: Not on file  . Attends Religious Services: Not on file  . Active Member of Clubs or Organizations: Not on file  . Attends Archivist Meetings: Not on file  .  Marital Status: Not on file  Intimate Partner Violence:   . Fear of Current or Ex-Partner: Not on file  . Emotionally Abused: Not on file  . Physically Abused: Not on file  . Sexually Abused: Not on file   Family History  Problem Relation Age of Onset  . Heart attack Father   . Heart failure Father   . Hypertension Father   . Hyperlipidemia Father   . Heart disease Father 17       CABG   . Pulmonary fibrosis Son   . Breast cancer Neg Hx    Current Outpatient Medications on File Prior to Visit  Medication Sig  . ACCU-CHEK AVIVA PLUS test strip U UTD BID  . acetaminophen (TYLENOL) 500 MG tablet Take 2 tablets by mouth 3 (three) times daily as needed.  Marland Kitchen amLODipine (NORVASC) 2.5 MG tablet TAKE 1 TABLET BY MOUTH  DAILY  . aspirin 81 MG tablet Take 81 mg by mouth daily.  . carvedilol (COREG) 3.125 MG tablet TAKE 1 TABLET BY MOUTH  TWICE DAILY  . ezetimibe (ZETIA) 10 MG tablet TAKE 1 TABLET BY MOUTH  DAILY  . FLUoxetine (PROZAC) 10 MG capsule Take 2 capsules (20 mg total) by mouth daily.  . fluticasone (FLONASE) 50 MCG/ACT nasal spray Place 2 sprays into both nostrils daily.  . Insulin Pen Needle (NOVOFINE PLUS) 32G X 4 MM MISC Use with Ozempic weekly injection as instructed  . levothyroxine  (SYNTHROID) 125 MCG tablet TAKE 1 TABLET BY MOUTH  DAILY BEFORE BREAKFAST  . losartan (COZAAR) 100 MG tablet TAKE 1 TABLET BY MOUTH  DAILY  . metFORMIN (GLUCOPHAGE-XR) 500 MG 24 hr tablet TAKE 3 TABLETS BY MOUTH  DAILY WITH BREAKFAST  . nitroGLYCERIN (NITROSTAT) 0.4 MG SL tablet Place 1 tablet (0.4 mg total) under the tongue every 5 (five) minutes as needed for chest pain.  Marland Kitchen OZEMPIC, 0.25 OR 0.5 MG/DOSE, 2 MG/1.5ML SOPN INJECT 0.5MG  SUBCUTANEOUSLY ONCE A WEEK AS DIRECTED  . rosuvastatin (CRESTOR) 20 MG tablet TAKE 1 TABLET BY MOUTH  DAILY   No current facility-administered medications on file prior to visit.    Review of Systems  Constitutional: Negative for activity change, appetite change, chills, diaphoresis, fatigue and fever.  HENT: Negative for congestion and hearing loss.   Eyes: Negative for visual disturbance.  Respiratory: Negative for cough, chest tightness, shortness of breath and wheezing.   Cardiovascular: Negative for chest pain, palpitations and leg swelling.  Gastrointestinal: Negative for abdominal pain, constipation, diarrhea, nausea and vomiting.  Genitourinary: Negative for dysuria, frequency and hematuria.  Musculoskeletal: Negative for arthralgias and neck pain.  Skin: Negative for rash.  Neurological: Negative for dizziness, weakness, light-headedness, numbness and headaches.  Hematological: Negative for adenopathy.  Psychiatric/Behavioral: Negative for behavioral problems, dysphoric mood and sleep disturbance.   Per HPI unless specifically indicated above      Objective:    BP (!) 125/49   Pulse (!) 57   Temp (!) 97.3 F (36.3 C) (Temporal)   Resp 16   Ht 5\' 7"  (1.702 m)   Wt 214 lb (97.1 kg)   SpO2 100%   BMI 33.52 kg/m   Wt Readings from Last 3 Encounters:  01/02/20 214 lb (97.1 kg)  07/02/19 209 lb 9.6 oz (95.1 kg)  01/24/19 220 lb 4 oz (99.9 kg)    Physical Exam Vitals and nursing note reviewed.  Constitutional:      General: She is not in  acute distress.    Appearance: She  is well-developed. She is not diaphoretic.     Comments: Well-appearing, comfortable, cooperative  HENT:     Head: Normocephalic and atraumatic.  Eyes:     General:        Right eye: No discharge.        Left eye: No discharge.     Conjunctiva/sclera: Conjunctivae normal.     Pupils: Pupils are equal, round, and reactive to light.  Neck:     Thyroid: No thyromegaly.  Cardiovascular:     Rate and Rhythm: Normal rate and regular rhythm.     Heart sounds: Normal heart sounds. No murmur heard.   Pulmonary:     Effort: Pulmonary effort is normal. No respiratory distress.     Breath sounds: Normal breath sounds. No wheezing or rales.  Abdominal:     General: Bowel sounds are normal. There is no distension.     Palpations: Abdomen is soft. There is no mass.     Tenderness: There is no abdominal tenderness.  Musculoskeletal:        General: No tenderness. Normal range of motion.     Cervical back: Normal range of motion and neck supple.     Comments: Upper / Lower Extremities: - Normal muscle tone, strength bilateral upper extremities 5/5, lower extremities 5/5  Lymphadenopathy:     Cervical: No cervical adenopathy.  Skin:    General: Skin is warm and dry.     Findings: No erythema or rash.  Neurological:     Mental Status: She is alert and oriented to person, place, and time.     Comments: Distal sensation intact to light touch all extremities  Psychiatric:        Behavior: Behavior normal.     Comments: Well groomed, good eye contact, normal speech and thoughts      Diabetic Foot Exam - Simple   No data filed      Results for orders placed or performed in visit on 12/25/19  T4, free  Result Value Ref Range   Free T4 1.4 0.8 - 1.8 ng/dL  TSH  Result Value Ref Range   TSH 0.06 (L) 0.40 - 4.50 mIU/L  Lipid panel  Result Value Ref Range   Cholesterol 115 <200 mg/dL   HDL 57 > OR = 50 mg/dL   Triglycerides 71 <150 mg/dL   LDL  Cholesterol (Calc) 43 mg/dL (calc)   Total CHOL/HDL Ratio 2.0 <5.0 (calc)   Non-HDL Cholesterol (Calc) 58 <130 mg/dL (calc)  COMPLETE METABOLIC PANEL WITH GFR  Result Value Ref Range   Glucose, Bld 120 (H) 65 - 99 mg/dL   BUN 19 7 - 25 mg/dL   Creat 1.03 (H) 0.60 - 0.93 mg/dL   GFR, Est Non African American 54 (L) > OR = 60 mL/min/1.46m2   GFR, Est African American 62 > OR = 60 mL/min/1.27m2   BUN/Creatinine Ratio 18 6 - 22 (calc)   Sodium 139 135 - 146 mmol/L   Potassium 4.6 3.5 - 5.3 mmol/L   Chloride 107 98 - 110 mmol/L   CO2 25 20 - 32 mmol/L   Calcium 8.7 8.6 - 10.4 mg/dL   Total Protein 6.6 6.1 - 8.1 g/dL   Albumin 3.6 3.6 - 5.1 g/dL   Globulin 3.0 1.9 - 3.7 g/dL (calc)   AG Ratio 1.2 1.0 - 2.5 (calc)   Total Bilirubin 0.5 0.2 - 1.2 mg/dL   Alkaline phosphatase (APISO) 62 37 - 153 U/L   AST 13 10 -  35 U/L   ALT 8 6 - 29 U/L  CBC with Differential/Platelet  Result Value Ref Range   WBC 6.1 3.8 - 10.8 Thousand/uL   RBC 3.53 (L) 3.80 - 5.10 Million/uL   Hemoglobin 10.9 (L) 11.7 - 15.5 g/dL   HCT 33.7 (L) 35 - 45 %   MCV 95.5 80.0 - 100.0 fL   MCH 30.9 27.0 - 33.0 pg   MCHC 32.3 32.0 - 36.0 g/dL   RDW 11.7 11.0 - 15.0 %   Platelets 214 140 - 400 Thousand/uL   MPV 10.4 7.5 - 12.5 fL   Neutro Abs 3,160 1,500 - 7,800 cells/uL   Lymphs Abs 1,958 850 - 3,900 cells/uL   Absolute Monocytes 580 200 - 950 cells/uL   Eosinophils Absolute 360 15.0 - 500.0 cells/uL   Basophils Absolute 43 0.0 - 200.0 cells/uL   Neutrophils Relative % 51.8 %   Total Lymphocyte 32.1 %   Monocytes Relative 9.5 %   Eosinophils Relative 5.9 %   Basophils Relative 0.7 %  Hemoglobin A1c  Result Value Ref Range   Hgb A1c MFr Bld 6.6 (H) <5.7 % of total Hgb   Mean Plasma Glucose 143 (calc)   eAG (mmol/L) 7.9 (calc)      Assessment & Plan:   Problem List Items Addressed This Visit    Major depressive disorder, recurrent, in remission ([place])    Stable, controlled on SSRI low dose Fluoxetine 10mg   daily In remission      Relevant Medications   FLUoxetine (PROZAC) 10 MG capsule   Hyperlipidemia associated with type 2 diabetes mellitus (HCC)    Controlled cholesterol, improved TG on statin and diet/lifestyle  LDL goal < 70 per Cards Last lipid panel 11/2019 ASCVD risk score is elevated due to known DM, CAD  Plan: 1. Continue current meds - Rosuvastatin 20mg  daily + Zetia per Cards 2. Continue ASA 81mg  for secondary ASCVD risk reduction 3. Encourage improved lifestyle - low carb/cholesterol, reduce portion size, continue improving exercise 4. Follow-up yearly w/ lipid      Controlled type 2 diabetes mellitus with diabetic nephropathy ([place])    Well controlled A1c 6.6 still improving, lifestyle and weight loss on GLP1 Complications - CKD-III Hyperlipidemia, Hypothyroidism Prior meds: Januvia, insulin  Plan:  1. CONTINUE Ozempic 0.5mg  weekly inj - no plan to increase dose - sample given today due to medicare donut hole - Continue Metformin XR 500mg  x 3 daily - offer to reduce, she defers, only if A1c < 6 2. Encourage improved lifestyle - low carb, low sugar diet, reduce portion size, continue improving regular exercise 3. Check CBG, bring log to next visit for review 4. Continue ASA, ARB, Statin UTD DM Eye 04/2019  6 mo follow up lab a1c      CKD (chronic kidney disease), stage III (HCC)   Benign hypertension with CKD (chronic kidney disease) stage III ([place])    Well-controlled HTN Complication with CKD-III   Plan:  1. Continue current BP regimen Losartan 100mg  daily, Carvedilol 3.125mg  BID, Amlodipine 2.5mg  daily 2. Encourage improved lifestyle - low sodium diet, regular exercise 3. Continue monitor BP outside office, bring readings to next visit, if persistently >140/90 or new symptoms notify office sooner       Other Visit Diagnoses    Annual physical exam    -  Primary   Encounter for screening mammogram for malignant neoplasm of breast       Relevant Orders    MM DIGITAL SCREENING  BILATERAL   Screening for colon cancer       Relevant Orders   Cologuard      Updated Health Maintenance information - Due for mamogram, ordered - Due for colon screening, ordered cologuard, we offered colonoscopy, reviewed anemia, she will proceed with cologuard and follow-up Reviewed recent lab results with patient Encouraged improvement to lifestyle with diet and exercise - Goal of weight loss  Orders Placed This Encounter  Procedures  . MM DIGITAL SCREENING BILATERAL    Standing Status:   Future    Standing Expiration Date:   07/01/2020    Order Specific Question:   Reason for Exam (SYMPTOM  OR DIAGNOSIS REQUIRED)    Answer:   Routine screening bilateral mammogram. May change to Bilateral MM [place] screening if indicated and appropriate for patient/insurance    Order Specific Question:   Preferred imaging location?    Answer:   [facility]  . Cologuard     No orders of the defined types were placed in this encounter.     Follow up plan: Return in about 6 months (around 07/01/2020) for 6 month DM A1c, Iron/Anemia, Thyroid.   Future labs ordered 06/24/20 A1c, TSH, Free T4, anemia panel CBC, BMET   Nobie Putnam, DO [facility] 01/02/2020, 1:32 PM

## 2020-01-03 ENCOUNTER — Other Ambulatory Visit: Payer: Self-pay | Admitting: Family Medicine

## 2020-01-03 DIAGNOSIS — E890 Postprocedural hypothyroidism: Secondary | ICD-10-CM

## 2020-01-03 DIAGNOSIS — D649 Anemia, unspecified: Secondary | ICD-10-CM

## 2020-01-03 DIAGNOSIS — I129 Hypertensive chronic kidney disease with stage 1 through stage 4 chronic kidney disease, or unspecified chronic kidney disease: Secondary | ICD-10-CM

## 2020-01-03 DIAGNOSIS — E1121 Type 2 diabetes mellitus with diabetic nephropathy: Secondary | ICD-10-CM

## 2020-01-03 NOTE — Assessment & Plan Note (Signed)
Well-controlled HTN Complication with CKD-III   Plan:  1. Continue current BP regimen Losartan 100mg  daily, Carvedilol 3.125mg  BID, Amlodipine 2.5mg  daily 2. Encourage improved lifestyle - low sodium diet, regular exercise 3. Continue monitor BP outside office, bring readings to next visit, if persistently >140/90 or new symptoms notify office sooner

## 2020-01-03 NOTE — Assessment & Plan Note (Signed)
Controlled cholesterol, improved TG on statin and diet/lifestyle  LDL goal < 70 per Cards Last lipid panel 11/2019 ASCVD risk score is elevated due to known DM, CAD  Plan: 1. Continue current meds - Rosuvastatin 20mg  daily + Zetia per Cards 2. Continue ASA 81mg  for secondary ASCVD risk reduction 3. Encourage improved lifestyle - low carb/cholesterol, reduce portion size, continue improving exercise 4. Follow-up yearly w/ lipid

## 2020-01-03 NOTE — Assessment & Plan Note (Signed)
Well controlled A1c 6.6 still improving, lifestyle and weight loss on GLP1 Complications - CKD-III Hyperlipidemia, Hypothyroidism Prior meds: Januvia, insulin  Plan:  1. CONTINUE Ozempic 0.5mg  weekly inj - no plan to increase dose - sample given today due to medicare donut hole - Continue Metformin XR 500mg  x 3 daily - offer to reduce, she defers, only if A1c < 6 2. Encourage improved lifestyle - low carb, low sugar diet, reduce portion size, continue improving regular exercise 3. Check CBG, bring log to next visit for review 4. Continue ASA, ARB, Statin UTD DM Eye 04/2019  6 mo follow up lab a1c

## 2020-01-03 NOTE — Assessment & Plan Note (Signed)
Stable, controlled on SSRI low dose Fluoxetine 10mg  daily In remission

## 2020-01-28 NOTE — Progress Notes (Signed)
Cardiology Office Note  Date:  01/29/2020   ID:  [name], [name] Dec 30, 1945, MRN [id number]  PCP:  Grace Hauser, DO   Chief Complaint  Patient presents with  . Follow-up    12 month/meds reviewed by the pt. verbally. "doing well."     HPI:  [name] is a pleasant 74 year old woman with past medical history of CAD June 2015, anterior ST elevation MI in [place]  thrombectomy and drug-eluting stent placement to the LAD.  EF 45%  echocardiogram in October 2015 showed normalization of LV function, at 55-60%.  hypertension, hyperlipidemia,  diabetes morbid obesity son passed in December 2016. She presents for follow-up of her coronary artery disease  Last seen in clinic November 2020 At that time was on new medications for diabetes, A1c was improving at 7.3 down to 6.6  Other lab work reviewed  total cholesterol 115 LDL 43  No regular exercise program Denies chest pain or shortness of breath concerning for angina  Husband with dementia, significant stress [name] comes in to help relieve her for several hours at a time She also has some family locally to help her  EKG personally reviewed by myself on todays visit Shows normal sinus rhythm rate 61 bpm no significant ST or T wave changes Poor R wave progression to the anterior precordial leads  PMH:   has a past medical history of Arthritis, Coronary artery disease, Endometrial cancer ([place]), Gravida 3 para 3, Hyperlipidemia, Hypertension, Hypertensive heart disease, Hypothyroid, Ischemic cardiomyopathy, Menopause, Myocardial infarction ([name]), Obstructive sleep apnea on CPAP, and Osteoarthritis.  PSH:    Past Surgical History:  Procedure Laterality Date  . ABDOMINAL HYSTERECTOMY    . CARDIAC CATHETERIZATION    . CATARACT EXTRACTION  08/30/2016  . CATARACT EXTRACTION W/PHACO Left 08/30/2016   Procedure: CATARACT EXTRACTION PHACO AND INTRAOCULAR LENS PLACEMENT ([place]) Left daibetic;  Surgeon:  Leandrew Koyanagi, MD;  Location: [facility];  Service: Ophthalmology;  Laterality: Left;  Diabetic - insulin and oral meds sleep apnea  . CATARACT EXTRACTION W/PHACO Right 10/07/2016   Procedure: CATARACT EXTRACTION PHACO AND INTRAOCULAR LENS PLACEMENT ([facility]);  Surgeon: Leandrew Koyanagi, MD;  Location: [facility];  Service: Ophthalmology;  Laterality: Right;  Lot# [id number] H [place]: 00:43.3 AP%: 12.2 CDE: 5.28  . CORONARY ANGIOPLASTY     drug eluting stent placed at [place]    . LAPAROSCOPIC HYSTERECTOMY    . THYROIDECTOMY      Current Outpatient Medications  Medication Sig Dispense Refill  . ACCU-CHEK AVIVA PLUS test strip U UTD BID 100 each 12  . acetaminophen (TYLENOL) 500 MG tablet Take 2 tablets by mouth 3 (three) times daily as needed.    Marland Kitchen amLODipine (NORVASC) 2.5 MG tablet TAKE 1 TABLET BY MOUTH  DAILY 90 tablet 0  . aspirin 81 MG tablet Take 81 mg by mouth daily.    . carvedilol (COREG) 3.125 MG tablet TAKE 1 TABLET BY MOUTH  TWICE DAILY 180 tablet 1  . ezetimibe (ZETIA) 10 MG tablet TAKE 1 TABLET BY MOUTH  DAILY 90 tablet 0  . FLUoxetine (PROZAC) 10 MG capsule Take 2 capsules (20 mg total) by mouth daily. 180 capsule 0  . fluticasone (FLONASE) 50 MCG/ACT nasal spray Place 2 sprays into both nostrils daily.    Marland Kitchen levothyroxine (SYNTHROID) 125 MCG tablet TAKE 1 TABLET BY MOUTH  DAILY BEFORE BREAKFAST 90 tablet 1  . losartan (COZAAR) 100 MG tablet TAKE 1 TABLET BY  MOUTH  DAILY 90 tablet 3  . metFORMIN (GLUCOPHAGE-XR) 500 MG 24 hr tablet TAKE 3 TABLETS BY MOUTH  DAILY WITH BREAKFAST 270 tablet 1  . nitroGLYCERIN (NITROSTAT) 0.4 MG SL tablet Place 1 tablet (0.4 mg total) under the tongue every 5 (five) minutes as needed for chest pain. 30 tablet 11  . OZEMPIC, 0.25 OR 0.5 MG/DOSE, 2 MG/1.5ML SOPN INJECT 0.5MG  SUBCUTANEOUSLY ONCE A WEEK AS DIRECTED 4.5 mL 3  . rosuvastatin (CRESTOR) 20 MG tablet TAKE 1 TABLET BY MOUTH  DAILY 90 tablet 3    No current facility-administered medications for this visit.    Allergies:   Patient has no known allergies.   Social History:  The patient  reports that she has never smoked. She has never used smokeless tobacco. She reports that she does not drink alcohol and does not use drugs.   Family History:   family history includes Heart attack in her father; Heart disease (age of onset: 71) in her father; Heart failure in her father; Hyperlipidemia in her father; Hypertension in her father; Pulmonary fibrosis in her son.    Review of Systems: Review of Systems  Constitutional: Negative.   HENT: Negative.   Respiratory: Negative.   Cardiovascular: Negative.   Gastrointestinal: Negative.   Musculoskeletal: Negative.   Neurological: Negative.   Psychiatric/Behavioral: Negative.   All other systems reviewed and are negative.   PHYSICAL EXAM: VS:  BP 140/60 (BP Location: Left Arm, Patient Position: Sitting, Cuff Size: Normal)   Pulse 61   Ht 5\' 7"  (1.702 m)   Wt 216 lb 4 oz (98.1 kg)   SpO2 98%   BMI 33.87 kg/m  , BMI Body mass index is 33.87 kg/m. Constitutional:  oriented to person, place, and time. No distress.  HENT:  Head: Grossly normal Eyes:  no discharge. No scleral icterus.  Neck: No JVD, no carotid bruits  Cardiovascular: Regular rate and rhythm, no murmurs appreciated Pulmonary/Chest: Clear to auscultation bilaterally, no wheezes or rails Abdominal: Soft.  no distension.  no tenderness.  Musculoskeletal: Normal range of motion Neurological:  normal muscle tone. Coordination normal. No atrophy Skin: Skin warm and dry Psychiatric: normal affect, pleasant  Recent Labs: 12/26/2019: ALT 8; BUN 19; Creat 1.03; Hemoglobin 10.9; Platelets 214; Potassium 4.6; Sodium 139; TSH 0.06    Lipid Panel Lab Results  Component Value Date   CHOL 115 12/26/2019   HDL 57 12/26/2019   LDLCALC 43 12/26/2019   TRIG 71 12/26/2019      Wt Readings from Last 3 Encounters:  01/29/20  216 lb 4 oz (98.1 kg)  01/02/20 214 lb (97.1 kg)  07/02/19 209 lb 9.6 oz (95.1 kg)     ASSESSMENT AND PLAN:  Coronary artery disease of native artery of native heart with stable angina pectoris (HCC) -  Currently with no symptoms of angina. No further workup at this time. Continue current medication regimen.  Essential (primary) hypertension - Plan: EKG 12-Lead Blood pressure is well controlled on today's visit. No changes made to the medications.  Mixed hyperlipidemia -  on Crestor Zetia No changes made Numbers at goal  Type 2 diabetes mellitus with other circulatory complication, with long-term current use of insulin (HCC) - Plan: EKG 12-Lead Recommended walking program, continue dietary changes  HBA1C 6.6  Chronic kidney disease (CKD) stage G3a/A1,  Stable numbers Improved diabetes numbers  Obstructive sleep apnea on CPAP Compliant with her cpap Sometimes broken sleep with husband with dementia issues, getting up at nighttime  Obesity Weight is up 6 or 7 pounds, likely eating for stress Recommend she consider starting a walking program for herself, for leg stability, weight loss, conditioning  Ischemic cardiomyopathy - Plan: EKG 12-Lead Previous ejection fraction 55 to 60% in May 2017 No new sx   Total encounter time more than 25 minutes  Greater than 50% was spent in counseling and coordination of care with the patient   Orders Placed This Encounter  Procedures  . EKG 12-Lead     Signed, Esmond Plants, M.D., Ph.D. 01/29/2020  [place], [place]

## 2020-01-29 ENCOUNTER — Encounter: Payer: Self-pay | Admitting: Cardiovascular Disease

## 2020-01-29 ENCOUNTER — Ambulatory Visit (INDEPENDENT_AMBULATORY_CARE_PROVIDER_SITE_OTHER): Payer: Medicare Other | Admitting: Cardiovascular Disease

## 2020-01-29 ENCOUNTER — Other Ambulatory Visit: Payer: Self-pay

## 2020-01-29 VITALS — BP 140/60 | HR 61 | Ht 67.0 in | Wt 216.2 lb

## 2020-01-29 DIAGNOSIS — Z794 Long term (current) use of insulin: Secondary | ICD-10-CM

## 2020-01-29 DIAGNOSIS — E890 Postprocedural hypothyroidism: Secondary | ICD-10-CM

## 2020-01-29 DIAGNOSIS — N1831 Chronic kidney disease, stage 3a: Secondary | ICD-10-CM

## 2020-01-29 DIAGNOSIS — Z6841 Body Mass Index (BMI) 40.0 and over, adult: Secondary | ICD-10-CM

## 2020-01-29 DIAGNOSIS — I10 Essential (primary) hypertension: Secondary | ICD-10-CM

## 2020-01-29 DIAGNOSIS — I255 Ischemic cardiomyopathy: Secondary | ICD-10-CM | POA: Diagnosis not present

## 2020-01-29 DIAGNOSIS — E785 Hyperlipidemia, unspecified: Secondary | ICD-10-CM

## 2020-01-29 DIAGNOSIS — E1169 Type 2 diabetes mellitus with other specified complication: Secondary | ICD-10-CM | POA: Diagnosis not present

## 2020-01-29 DIAGNOSIS — I25118 Atherosclerotic heart disease of native coronary artery with other forms of angina pectoris: Secondary | ICD-10-CM

## 2020-01-29 DIAGNOSIS — E1159 Type 2 diabetes mellitus with other circulatory complications: Secondary | ICD-10-CM | POA: Diagnosis not present

## 2020-01-29 DIAGNOSIS — I2510 Atherosclerotic heart disease of native coronary artery without angina pectoris: Secondary | ICD-10-CM

## 2020-01-29 MED ORDER — LEVOTHYROXINE SODIUM 125 MCG PO TABS: ORAL_TABLET | ORAL | 3 refills | Status: DC

## 2020-01-29 MED ORDER — EZETIMIBE 10 MG PO TABS
10.0000 mg | ORAL_TABLET | Freq: Every day | ORAL | 3 refills | Status: DC
Start: 2020-01-29 — End: 2021-01-30

## 2020-01-29 MED ORDER — CARVEDILOL 3.125 MG PO TABS: 3.1250 mg | ORAL_TABLET | Freq: Two times a day (BID) | ORAL | 3 refills | Status: DC

## 2020-01-29 MED ORDER — NITROGLYCERIN 0.4 MG SL SUBL: 0.4000 mg | SUBLINGUAL_TABLET | SUBLINGUAL | 11 refills | Status: DC | PRN

## 2020-01-29 MED ORDER — ROSUVASTATIN CALCIUM 20 MG PO TABS: 20.0000 mg | ORAL_TABLET | Freq: Every day | ORAL | 3 refills | Status: DC

## 2020-01-29 MED ORDER — AMLODIPINE BESYLATE 2.5 MG PO TABS: 2.5000 mg | ORAL_TABLET | Freq: Every day | ORAL | 3 refills | Status: DC

## 2020-01-29 MED ORDER — LOSARTAN POTASSIUM 100 MG PO TABS
100.0000 mg | ORAL_TABLET | Freq: Every day | ORAL | 3 refills | Status: DC
Start: 2020-01-29 — End: 2020-12-03

## 2020-01-29 NOTE — Patient Instructions (Signed)
Medication Instructions:  No changes  If you need a refill on your cardiac medications before your next appointment, please call your pharmacy.    Lab work: No new labs needed   If you have labs (blood work) drawn today and your tests are completely normal, you will receive your results only by: . MyChart Message (if you have MyChart) OR . A paper copy in the mail If you have any lab test that is abnormal or we need to change your treatment, we will call you to review the results.   Testing/Procedures: No new testing needed   Follow-Up: At [facility], you and your health needs are our priority.  As part of our continuing mission to provide you with exceptional heart care, we have created designated Provider Care Teams.  These Care Teams include your primary Cardiologist (physician) and Advanced Practice Providers (APPs -  Physician Assistants and Nurse Practitioners) who all work together to provide you with the care you need, when you need it.  . You will need a follow up appointment in 12 months  . Providers on your designated Care Team:   . Christopher Berge, NP . Ryan Dunn, PA-C . Jacquelyn Visser, PA-C  Any Other Special Instructions Will Be Listed Below (If Applicable).  COVID-19 Vaccine Information can be found at: [email or web address] For questions related to vaccine distribution or appointments, please email [email or web address] or call [phone].     

## 2020-02-12 ENCOUNTER — Ambulatory Visit (INDEPENDENT_AMBULATORY_CARE_PROVIDER_SITE_OTHER): Payer: Medicare Other

## 2020-02-12 VITALS — Ht 67.0 in | Wt 209.0 lb

## 2020-02-12 DIAGNOSIS — Z0000 Encounter for general adult medical examination without abnormal findings: Secondary | ICD-10-CM

## 2020-02-12 NOTE — Progress Notes (Signed)
I connected with [name] today by telephone and verified that I am speaking with the correct person using two identifiers. Location patient: home Location provider: work Persons participating in the virtual visit: [place], Spayd LPN.   I discussed the limitations, risks, security and privacy concerns of performing an evaluation and management service by telephone and the availability of in person appointments. I also discussed with the patient that there may be a patient responsible charge related to this service. The patient expressed understanding and verbally consented to this telephonic visit.    Interactive audio and video telecommunications were attempted between this provider and patient, however failed, due to patient having technical difficulties OR patient did not have access to video capability.  We continued and completed visit with audio only.     Vital signs may be patient reported or missing.  Subjective:   [name] is a 74 y.o. female who presents for Medicare Annual (Subsequent) preventive examination.  Review of Systems     Cardiac Risk Factors include: advanced age (>68men, >8 women);diabetes mellitus;hypertension;obesity (BMI >30kg/m2);sedentary lifestyle     Objective:    Today's Vitals   02/12/20 0936  Weight: 209 lb (94.8 kg)  Height: 5\' 7"  (1.702 m)   Body mass index is 32.73 kg/m.  Advanced Directives 02/12/2020 11/30/2016 11/30/2016 08/30/2016  Does Patient Have a Medical Advance Directive? Yes No No No  Type of Paramedic of [place];Living will - - -  Would patient like information on creating a medical advance directive? - Yes (MAU/Ambulatory/Procedural Areas - Information given) - Yes (MAU/Ambulatory/Procedural Areas - Information given)    Current Medications (verified) Outpatient Encounter Medications as of 02/12/2020  Medication Sig  . ACCU-CHEK AVIVA PLUS test strip U UTD BID  .  acetaminophen (TYLENOL) 500 MG tablet Take 2 tablets by mouth 3 (three) times daily as needed.  Marland Kitchen amLODipine (NORVASC) 2.5 MG tablet Take 1 tablet (2.5 mg total) by mouth daily.  Marland Kitchen aspirin 81 MG tablet Take 81 mg by mouth daily.  . carvedilol (COREG) 3.125 MG tablet Take 1 tablet (3.125 mg total) by mouth 2 (two) times daily.  Marland Kitchen ezetimibe (ZETIA) 10 MG tablet Take 1 tablet (10 mg total) by mouth daily.  Marland Kitchen FLUoxetine (PROZAC) 10 MG capsule Take 2 capsules (20 mg total) by mouth daily.  . fluticasone (FLONASE) 50 MCG/ACT nasal spray Place 2 sprays into both nostrils daily.  Marland Kitchen levothyroxine (SYNTHROID) 125 MCG tablet TAKE 1 TABLET BY MOUTH  DAILY BEFORE BREAKFAST  . losartan (COZAAR) 100 MG tablet Take 1 tablet (100 mg total) by mouth daily.  . metFORMIN (GLUCOPHAGE-XR) 500 MG 24 hr tablet TAKE 3 TABLETS BY MOUTH  DAILY WITH BREAKFAST  . nitroGLYCERIN (NITROSTAT) 0.4 MG SL tablet Place 1 tablet (0.4 mg total) under the tongue every 5 (five) minutes as needed for chest pain.  Marland Kitchen OZEMPIC, 0.25 OR 0.5 MG/DOSE, 2 MG/1.5ML SOPN INJECT 0.5MG  SUBCUTANEOUSLY ONCE A WEEK AS DIRECTED  . rosuvastatin (CRESTOR) 20 MG tablet Take 1 tablet (20 mg total) by mouth daily.   No facility-administered encounter medications on file as of 02/12/2020.    Allergies (verified) Patient has no known allergies.   History: Past Medical History:  Diagnosis Date  . Arthritis    knees   . Coronary artery disease    a. 07/2013 STEMI/PCI ([place]): LM nl, LAD 100p (thrombectomy & 3.5x23 Alpine DES), LCX nl, RCA nl, EF 45%.  . Endometrial cancer ([place])  THYROID CANCER  . Gravida 3 para 3   . Hyperlipidemia   . Hypertension   . Hypertensive heart disease   . Hypothyroid   . Ischemic cardiomyopathy    a. 07/2013 EF 45% @ time of STEMI; b. 11/2013 Echo: EF 55-60%, mildly dil LA, nl RV fxn.  . Menopause   . Myocardial infarction ([place])    2016  . Obstructive sleep apnea on CPAP   . Osteoarthritis    Past Surgical History:   Procedure Laterality Date  . ABDOMINAL HYSTERECTOMY    . CARDIAC CATHETERIZATION    . CATARACT EXTRACTION  08/30/2016  . CATARACT EXTRACTION W/PHACO Left 08/30/2016   Procedure: CATARACT EXTRACTION PHACO AND INTRAOCULAR LENS PLACEMENT ([place]) Left daibetic;  Surgeon: Leandrew Koyanagi, MD;  Location: [facility];  Service: Ophthalmology;  Laterality: Left;  Diabetic - insulin and oral meds sleep apnea  . CATARACT EXTRACTION W/PHACO Right 10/07/2016   Procedure: CATARACT EXTRACTION PHACO AND INTRAOCULAR LENS PLACEMENT ([facility]);  Surgeon: Leandrew Koyanagi, MD;  Location: [facility];  Service: Ophthalmology;  Laterality: Right;  Lot# [id number] H [place]: 00:43.3 AP%: 12.2 CDE: 5.28  . CORONARY ANGIOPLASTY     drug eluting stent placed at [place]    . LAPAROSCOPIC HYSTERECTOMY    . THYROIDECTOMY     Family History  Problem Relation Age of Onset  . Heart attack Father   . Heart failure Father   . Hypertension Father   . Hyperlipidemia Father   . Heart disease Father 64       CABG   . Pulmonary fibrosis Son   . Breast cancer Neg Hx    Social History   Socioeconomic History  . Marital status: Married    Spouse name: Not on file  . Number of children: Not on file  . Years of education: Not on file  . Highest education level: Not on file  Occupational History  . Occupation: retired  Tobacco Use  . Smoking status: Never Smoker  . Smokeless tobacco: Never Used  Vaping Use  . Vaping Use: Never used  Substance and Sexual Activity  . Alcohol use: No  . Drug use: No  . Sexual activity: Not on file  Other Topics Concern  . Not on file  Social History Narrative  . Not on file   Social Determinants of Health   Financial Resource Strain: Low Risk   . Difficulty of Paying Living Expenses: Not hard at all  Food Insecurity: No Food Insecurity  . Worried About Charity fundraiser in the Last Year: Never true  . Ran Out of Food in the Last  Year: Never true  Transportation Needs: No Transportation Needs  . Lack of Transportation (Medical): No  . Lack of Transportation (Non-Medical): No  Physical Activity: Inactive  . Days of Exercise per Week: 0 days  . Minutes of Exercise per Session: 0 min  Stress: No Stress Concern Present  . Feeling of Stress : Only a little  Social Connections: Not on file    Tobacco Counseling Counseling given: Not Answered   Clinical Intake:  Pre-visit preparation completed: Yes  Pain : No/denies pain     Nutritional Status: BMI > 30  Obese Nutritional Risks: None Diabetes: Yes  How often do you need to have someone help you when you read instructions, pamphlets, or other written materials from your doctor or pharmacy?: 1 - Never What is the last grade level you completed in school?:  bachelor's degree  Diabetic? Yes Nutrition Risk Assessment:  Has the patient had any N/V/D within the last 2 months?  No  Does the patient have any non-healing wounds?  No  Has the patient had any unintentional weight loss or weight gain?  No   Diabetes:  Is the patient diabetic?  Yes  If diabetic, was a CBG obtained today?  No  Did the patient bring in their glucometer from home?  No  How often do you monitor your CBG's? none.   Financial Strains and Diabetes Management:  Are you having any financial strains with the device, your supplies or your medication? No .  Does the patient want to be seen by Chronic Care Management for management of their diabetes?  No  Would the patient like to be referred to a Nutritionist or for Diabetic Management?  No   Diabetic Exams:  Diabetic Eye Exam: Completed 04/20/2019 Diabetic Foot Exam: Overdue, Pt has been advised about the importance in completing this exam. Pt is scheduled for diabetic foot exam on next appointment.      Information entered by :: NAllen LPN   Activities of Daily Living In your present state of health, do you have any difficulty  performing the following activities: 02/12/2020 07/02/2019  Hearing? N N  Vision? Y N  Comment at night -  Difficulty concentrating or making decisions? N N  Walking or climbing stairs? N N  Dressing or bathing? N N  Doing errands, shopping? N N  Preparing Food and eating ? N -  Using the Toilet? N -  In the past six months, have you accidently leaked urine? N -  Do you have problems with loss of bowel control? N -  Managing your Medications? N -  Managing your Finances? N -  Housekeeping or managing your Housekeeping? N -  Some recent data might be hidden    Patient Care Team: Olin Hauser, DO as PCP - General (Family Medicine) Leandrew Koyanagi, MD as Referring Physician (Ophthalmology) Minna Merritts, MD as Consulting Physician (Cardiology) [place], Virl Diamond, [facility] as Pharmacist  Indicate any recent Medical Services you may have received from other than [facility] providers in the past year (date may be approximate).     Assessment:   This is a routine wellness examination for Shirle.  Hearing/Vision screen No exam data present  Dietary issues and exercise activities discussed: Current Exercise Habits: The patient does not participate in regular exercise at present  Goals    . Patient Stated     02/12/2020, wants to get below 200 pounds    . PharmD - Medication Adherence     CARE PLAN ENTRY (see longitudinal plan of care for additional care plan information)   Current Barriers:  . Chronic Disease Management support, education, and care coordination needs related to HTN, CAD, T2DM . Medication Adherence gap as identified by health plan  Pharmacist Clinical Goal(s):  Marland Kitchen Over the next 1 days, patient will work with CM Pharmacist to address needs related to medication adherence  Interventions: . Assess adherence to losartan 100 mg once daily (as identified by health plan).  o Patient denies barriers to taking losartan. Reports using weekly pillboxes as  adherence aid o Review dispensing history in chart. No gaps in recent refill history noted. o Encourage patient to continue adherence to medication and use of weekly pillbox . Patient reports often takes carvedilol once daily rather than twice daily as difficult to take evening dose o [employer]  on rational for taking carvedilol twice daily and discuss methods to aid with adherence to taking evening dose. o Note carvedilol comes in extended release formulation, but not covered on current health plan formulary o Denies current home monitoring of blood pressure . Patient denies further medication questions/concerns at this time.  Patient Self Care Activities:  . Attends all scheduled provider appointments  Initial goal documentation     . Reduce sugar intake to X grams per day     Recommend eliminating carbs and sweets from diet      Depression Screen PHQ 2/9 Scores 02/12/2020 01/02/2020 07/02/2019 01/01/2019 07/06/2018 01/05/2018 06/23/2017  PHQ - 2 Score 1 0 0 0 0 0 4  PHQ- 9 Score - 2 2 2 2  0 6    Fall Risk Fall Risk  02/12/2020 01/02/2020 07/02/2019 07/06/2018 01/05/2018  Falls in the past year? 0 0 0 0 0  Number falls in past yr: - 0 0 - -  Injury with Fall? - 0 0 - -  Risk for fall due to : Medication side effect - - - -  Risk for fall due to: Comment - - - - -  Follow up Falls evaluation completed;Education provided;Falls prevention discussed Falls evaluation completed Falls evaluation completed Falls evaluation completed -    FALL RISK PREVENTION PERTAINING TO THE HOME:  Any stairs in or around the home? Yes  If so, are there any without handrails? No  Home free of loose throw rugs in walkways, pet beds, electrical cords, etc? Yes  Adequate lighting in your home to reduce risk of falls? Yes   ASSISTIVE DEVICES UTILIZED TO PREVENT FALLS:  Life alert? No  Use of a cane, walker or w/c? No  Grab bars in the bathroom? Yes  Shower chair or bench in shower? Yes  Elevated toilet seat or a  handicapped toilet? Yes   TIMED UP AND GO:  Was the test performed? No .   Cognitive Function:     6CIT Screen 02/12/2020 11/30/2016  What Year? 0 points 0 points  What month? 0 points 0 points  What time? 0 points 0 points  Count back from 20 0 points 0 points  Months in reverse 0 points 0 points  Repeat phrase 0 points 0 points  Total Score 0 0    Immunizations Immunization History  Administered Date(s) Administered  . Influenza Split 12/29/2011  . Influenza, High Dose Seasonal PF 11/19/2014, 12/11/2015, 11/30/2016, 12/16/2017  . Influenza,inj,Quad PF,6+ Mos 12/20/2012, 12/05/2018  . Influenza-Unspecified 12/14/2019  . PFIZER SARS-COV-2 Vaccination 05/02/2019, 05/23/2019, 11/26/2019  . PPD Test 01/19/2016  . Pneumococcal Conjugate-13 01/18/2014  . Pneumococcal Polysaccharide-23 07/30/2004, 12/20/2012  . Tdap 08/02/2013    TDAP status: Up to date  Flu Vaccine status: Up to date  Pneumococcal vaccine status: Up to date  Covid-19 vaccine status: Completed vaccines  Qualifies for Shingles Vaccine? Yes   Zostavax completed No   Shingrix Completed?: No.    Education has been provided regarding the importance of this vaccine. Patient has been advised to call insurance company to determine out of pocket expense if they have not yet received this vaccine. Advised may also receive vaccine at local pharmacy or Health Dept. Verbalized acceptance and understanding.  Screening Tests Health Maintenance  Topic Date Due  . MAMMOGRAM  10/15/2018  . FOOT EXAM  01/01/2020  . Fecal DNA (Cologuard)  01/04/2020  . OPHTHALMOLOGY EXAM  04/19/2020  . HEMOGLOBIN A1C  06/25/2020  . TETANUS/TDAP  08/03/2023  .  INFLUENZA VACCINE  Completed  . DEXA SCAN  Completed  . COVID-19 Vaccine  Completed  . Hepatitis C Screening  Completed  . PNA vac Low Risk Adult  Completed    Health Maintenance  Health Maintenance Due  Topic Date Due  . MAMMOGRAM  10/15/2018  . FOOT EXAM  01/01/2020  .  Fecal DNA (Cologuard)  01/04/2020    Colorectal cancer screening: Type of screening: Cologuard. Completed 01/04/2017. Repeat every 3 years  Mammogram status: patient to schedule  Bone Density status: Completed 01/01/2013. Results reflect: Bone density results: NORMAL. Repeat every 0 years.  Lung Cancer Screening: (Low Dose CT Chest recommended if Age 74-80 years, 30 pack-year currently smoking OR have quit w/in 15years.) does not qualify.   Lung Cancer Screening Referral: no  Additional Screening:  Hepatitis C Screening: does qualify; Completed 12/20/2016  Vision Screening: Recommended annual ophthalmology exams for early detection of glaucoma and other disorders of the eye. Is the patient up to date with their annual eye exam?  Yes  Who is the provider or what is the name of the office in which the patient attends annual eye exams? Dr. Wallace Going If pt is not established with a provider, would they like to be referred to a provider to establish care? No .   Dental Screening: Recommended annual dental exams for proper oral hygiene  Community Resource Referral / Chronic Care Management: CRR required this visit?  No   CCM required this visit?  No      Plan:     I have personally reviewed and noted the following in the patient's chart:   . Medical and social history . Use of alcohol, tobacco or illicit drugs  . Current medications and supplements . Functional ability and status . Nutritional status . Physical activity . Advanced directives . List of other physicians . Hospitalizations, surgeries, and ER visits in previous 12 months . Vitals . Screenings to include cognitive, depression, and falls . Referrals and appointments  In addition, I have reviewed and discussed with patient certain preventive protocols, quality metrics, and best practice recommendations. A written personalized care plan for preventive services as well as general preventive health recommendations  were provided to patient.     Kellie Simmering, LPN   [id number]   Nurse Notes:

## 2020-02-12 NOTE — Patient Instructions (Signed)
[name] , Thank you for taking time to come for your Medicare Wellness Visit. I appreciate your ongoing commitment to your health goals. Please review the following plan we discussed and let me know if I can assist you in the future.   Screening recommendations/referrals: Colonoscopy: has cologuard kit at home Mammogram: patient to schedule Bone Density: completed 01/01/2013 Recommended yearly ophthalmology/optometry visit for glaucoma screening and checkup Recommended yearly dental visit for hygiene and checkup  Vaccinations: Influenza vaccine: completed 12/14/2019, due 09/29/2020 Pneumococcal vaccine: completed 01/18/2014 Tdap vaccine: completed 08/02/2013 Shingles vaccine: discussed   Covid-19: 11/26/2019, 05/23/2019, 05/02/2019  Advanced directives: Please bring a copy of your POA (Power of Attorney) and/or Living Will to your next appointment.   Conditions/risks identified: none  Next appointment: Follow up in one year for your annual wellness visit    Preventive Care 65 Years and Older, Female Preventive care refers to lifestyle choices and visits with your health care provider that can promote health and wellness. What does preventive care include?  A yearly physical exam. This is also called an annual well check.  Dental exams once or twice a year.  Routine eye exams. Ask your health care provider how often you should have your eyes checked.  Personal lifestyle choices, including:  Daily care of your teeth and gums.  Regular physical activity.  Eating a healthy diet.  Avoiding tobacco and drug use.  Limiting alcohol use.  Practicing safe sex.  Taking low-dose aspirin every day.  Taking vitamin and mineral supplements as recommended by your health care provider. What happens during an annual well check? The services and screenings done by your health care provider during your annual well check will depend on your age, overall health, lifestyle risk factors, and  family history of disease. Counseling  Your health care provider may ask you questions about your:  Alcohol use.  Tobacco use.  Drug use.  Emotional well-being.  Home and relationship well-being.  Sexual activity.  Eating habits.  History of falls.  Memory and ability to understand (cognition).  Work and work Statistician.  Reproductive health. Screening  You may have the following tests or measurements:  Height, weight, and BMI.  Blood pressure.  Lipid and cholesterol levels. These may be checked every 5 years, or more frequently if you are over 100 years old.  Skin check.  Lung cancer screening. You may have this screening every year starting at age 93 if you have a 30-pack-year history of smoking and currently smoke or have quit within the past 15 years.  Fecal occult blood test (FOBT) of the stool. You may have this test every year starting at age 34.  Flexible sigmoidoscopy or colonoscopy. You may have a sigmoidoscopy every 5 years or a colonoscopy every 10 years starting at age 69.  Hepatitis C blood test.  Hepatitis B blood test.  Sexually transmitted disease (STD) testing.  Diabetes screening. This is done by checking your blood sugar (glucose) after you have not eaten for a while (fasting). You may have this done every 1-3 years.  Bone density scan. This is done to screen for osteoporosis. You may have this done starting at age 63.  Mammogram. This may be done every 1-2 years. Talk to your health care provider about how often you should have regular mammograms. Talk with your health care provider about your test results, treatment options, and if necessary, the need for more tests. Vaccines  Your health care provider may recommend certain vaccines, such as:  Influenza vaccine. This is recommended every year.  Tetanus, diphtheria, and acellular pertussis (Tdap, Td) vaccine. You may need a Td booster every 10 years.  Zoster vaccine. You may need this  after age 28.  Pneumococcal 13-valent conjugate (PCV13) vaccine. One dose is recommended after age 10.  Pneumococcal polysaccharide (PPSV23) vaccine. One dose is recommended after age 85. Talk to your health care provider about which screenings and vaccines you need and how often you need them. This information is not intended to replace advice given to you by your health care provider. Make sure you discuss any questions you have with your health care provider. Document Released: 03/14/2015 Document Revised: 11/05/2015 Document Reviewed: 12/17/2014 Elsevier Interactive Patient Education  2017 [place] Prevention in the Home Falls can cause injuries. They can happen to people of all ages. There are many things you can do to make your home safe and to help prevent falls. What can I do on the outside of my home?  Regularly fix the edges of walkways and driveways and fix any cracks.  Remove anything that might make you trip as you walk through a door, such as a raised step or threshold.  Trim any bushes or trees on the path to your home.  Use bright outdoor lighting.  Clear any walking paths of anything that might make someone trip, such as rocks or tools.  Regularly check to see if handrails are loose or broken. Make sure that both sides of any steps have handrails.  Any raised decks and porches should have guardrails on the edges.  Have any leaves, snow, or ice cleared regularly.  Use sand or salt on walking paths during winter.  Clean up any spills in your garage right away. This includes oil or grease spills. What can I do in the bathroom?  Use night lights.  Install grab bars by the toilet and in the tub and shower. Do not use towel bars as grab bars.  Use non-skid mats or decals in the tub or shower.  If you need to sit down in the shower, use a plastic, non-slip stool.  Keep the floor dry. Clean up any water that spills on the floor as soon as it  happens.  Remove soap buildup in the tub or shower regularly.  Attach bath mats securely with double-sided non-slip rug tape.  Do not have throw rugs and other things on the floor that can make you trip. What can I do in the bedroom?  Use night lights.  Make sure that you have a light by your bed that is easy to reach.  Do not use any sheets or blankets that are too big for your bed. They should not hang down onto the floor.  Have a firm chair that has side arms. You can use this for support while you get dressed.  Do not have throw rugs and other things on the floor that can make you trip. What can I do in the kitchen?  Clean up any spills right away.  Avoid walking on wet floors.  Keep items that you use a lot in easy-to-reach places.  If you need to reach something above you, use a strong step stool that has a grab bar.  Keep electrical cords out of the way.  Do not use floor polish or wax that makes floors slippery. If you must use wax, use non-skid floor wax.  Do not have throw rugs and other things on the floor that  can make you trip. What can I do with my stairs?  Do not leave any items on the stairs.  Make sure that there are handrails on both sides of the stairs and use them. Fix handrails that are broken or loose. Make sure that handrails are as long as the stairways.  Check any carpeting to make sure that it is firmly attached to the stairs. Fix any carpet that is loose or worn.  Avoid having throw rugs at the top or bottom of the stairs. If you do have throw rugs, attach them to the floor with carpet tape.  Make sure that you have a light switch at the top of the stairs and the bottom of the stairs. If you do not have them, ask someone to add them for you. What else can I do to help prevent falls?  Wear shoes that:  Do not have high heels.  Have rubber bottoms.  Are comfortable and fit you well.  Are closed at the toe. Do not wear sandals.  If you  use a stepladder:  Make sure that it is fully opened. Do not climb a closed stepladder.  Make sure that both sides of the stepladder are locked into place.  Ask someone to hold it for you, if possible.  Clearly mark and make sure that you can see:  Any grab bars or handrails.  First and last steps.  Where the edge of each step is.  Use tools that help you move around (mobility aids) if they are needed. These include:  Canes.  Walkers.  Scooters.  Crutches.  Turn on the lights when you go into a dark area. Replace any light bulbs as soon as they burn out.  Set up your furniture so you have a clear path. Avoid moving your furniture around.  If any of your floors are uneven, fix them.  If there are any pets around you, be aware of where they are.  Review your medicines with your doctor. Some medicines can make you feel dizzy. This can increase your chance of falling. Ask your doctor what other things that you can do to help prevent falls. This information is not intended to replace advice given to you by your health care provider. Make sure you discuss any questions you have with your health care provider. Document Released: 12/12/2008 Document Revised: 07/24/2015 Document Reviewed: 03/22/2014 Elsevier Interactive Patient Education  2017 [employer].

## 2020-02-20 ENCOUNTER — Other Ambulatory Visit: Payer: Self-pay | Admitting: Family Medicine

## 2020-02-20 DIAGNOSIS — F3340 Major depressive disorder, recurrent, in remission, unspecified: Secondary | ICD-10-CM

## 2020-03-06 ENCOUNTER — Other Ambulatory Visit: Payer: Self-pay | Admitting: Family Medicine

## 2020-03-06 DIAGNOSIS — E1121 Type 2 diabetes mellitus with diabetic nephropathy: Secondary | ICD-10-CM

## 2020-03-07 ENCOUNTER — Other Ambulatory Visit: Payer: Self-pay | Admitting: Family Medicine

## 2020-03-07 DIAGNOSIS — F3340 Major depressive disorder, recurrent, in remission, unspecified: Secondary | ICD-10-CM

## 2020-04-15 DIAGNOSIS — H40003 Preglaucoma, unspecified, bilateral: Secondary | ICD-10-CM | POA: Diagnosis not present

## 2020-04-22 DIAGNOSIS — E119 Type 2 diabetes mellitus without complications: Secondary | ICD-10-CM | POA: Diagnosis not present

## 2020-04-22 LAB — HM DIABETES EYE EXAM

## 2020-05-01 ENCOUNTER — Encounter: Payer: Self-pay | Admitting: Family Medicine

## 2020-05-09 ENCOUNTER — Other Ambulatory Visit: Payer: Self-pay | Admitting: Family Medicine

## 2020-05-09 DIAGNOSIS — E1121 Type 2 diabetes mellitus with diabetic nephropathy: Secondary | ICD-10-CM

## 2020-05-09 NOTE — Telephone Encounter (Signed)
Requested medication (s) are due for refill today: Yes  Requested medication (s) are on the active medication list: Yes  Last refill:  04/02/19  Future visit scheduled: Yes  Notes to clinic:  Prescription has expired.    Requested Prescriptions  Pending Prescriptions Disp Refills   OZEMPIC, 0.25 OR 0.5 MG/DOSE, 2 MG/1.5ML SOPN [Pharmacy Med Name: OZEMPIC PEN 0.25/0.5MG  DOSE] 4.5 mL 3    Sig: INJECT 0.5MG  SUBCUTANEOUSLY ONCE WEEKLY AS DIRECTED      Endocrinology:  Diabetes - GLP-1 Receptor Agonists Passed - 05/09/2020 10:50 AM      Passed - HBA1C is between 0 and 7.9 and within 180 days    Hemoglobin A1C  Date Value Ref Range Status  08/10/2018 7.7  Final   Hgb A1c MFr Bld  Date Value Ref Range Status  12/26/2019 6.6 (H) <5.7 % of total Hgb Final    Comment:    For someone without known diabetes, a hemoglobin A1c value of 6.5% or greater indicates that they may have  diabetes and this should be confirmed with a follow-up  test. . For someone with known diabetes, a value <7% indicates  that their diabetes is well controlled and a value  greater than or equal to 7% indicates suboptimal  control. A1c targets should be individualized based on  duration of diabetes, age, comorbid conditions, and  other considerations. . Currently, no consensus exists regarding use of hemoglobin A1c for diagnosis of diabetes for children. Renella Cunas - Valid encounter within last 6 months    Recent Outpatient Visits           4 months ago Annual physical exam   [place], [place]   10 months ago Controlled type 2 diabetes mellitus with diabetic nephropathy, without long-term current use of insulin ([place])   [place], DO   1 year ago Annual physical exam   [facility] Olin Hauser, DO   1 year ago Controlled type 2 diabetes mellitus with diabetic nephropathy, without  long-term current use of insulin [facility])   [facility] [place], Devonne Doughty, DO   2 years ago Annual physical exam   [facility] Olin Hauser, DO       Future Appointments             In 1 month [place], Devonne Doughty, DO [facility], [place]   In 9 months  [facility], [place]

## 2020-06-20 ENCOUNTER — Other Ambulatory Visit: Payer: Self-pay | Admitting: Family Medicine

## 2020-06-20 DIAGNOSIS — R590 Localized enlarged lymph nodes: Secondary | ICD-10-CM

## 2020-06-20 DIAGNOSIS — N63 Unspecified lump in unspecified breast: Secondary | ICD-10-CM

## 2020-06-24 ENCOUNTER — Other Ambulatory Visit: Payer: Self-pay

## 2020-06-24 ENCOUNTER — Other Ambulatory Visit: Payer: Medicare Other

## 2020-06-24 DIAGNOSIS — E890 Postprocedural hypothyroidism: Secondary | ICD-10-CM | POA: Diagnosis not present

## 2020-06-24 DIAGNOSIS — I129 Hypertensive chronic kidney disease with stage 1 through stage 4 chronic kidney disease, or unspecified chronic kidney disease: Secondary | ICD-10-CM

## 2020-06-24 DIAGNOSIS — N183 Chronic kidney disease, stage 3 unspecified: Secondary | ICD-10-CM | POA: Diagnosis not present

## 2020-06-24 DIAGNOSIS — D649 Anemia, unspecified: Secondary | ICD-10-CM

## 2020-06-24 DIAGNOSIS — E1121 Type 2 diabetes mellitus with diabetic nephropathy: Secondary | ICD-10-CM | POA: Diagnosis not present

## 2020-06-25 ENCOUNTER — Ambulatory Visit
Admission: RE | Admit: 2020-06-25 | Discharge: 2020-06-25 | Disposition: A | Discharge status: Home / Self Care | Payer: Medicare Other | Source: Ambulatory Visit | Attending: Family Medicine | Admitting: Family Medicine

## 2020-06-25 DIAGNOSIS — R590 Localized enlarged lymph nodes: Secondary | ICD-10-CM | POA: Diagnosis not present

## 2020-06-25 DIAGNOSIS — R928 Other abnormal and inconclusive findings on diagnostic imaging of breast: Secondary | ICD-10-CM | POA: Diagnosis not present

## 2020-06-25 DIAGNOSIS — N6002 Solitary cyst of left breast: Secondary | ICD-10-CM | POA: Diagnosis not present

## 2020-06-25 DIAGNOSIS — N63 Unspecified lump in unspecified breast: Secondary | ICD-10-CM | POA: Diagnosis not present

## 2020-06-25 LAB — CBC WITH DIFFERENTIAL/PLATELET
Absolute Monocytes: 496 cells/uL (ref 200–950)
Basophils Absolute: 51 cells/uL (ref 0–200)
Basophils Relative: 0.9 %
Eosinophils Absolute: 353 cells/uL (ref 15–500)
Eosinophils Relative: 6.2 %
HCT: 33.7 % — ABNORMAL LOW (ref 35.0–45.0)
Hemoglobin: 10.9 g/dL — ABNORMAL LOW (ref 11.7–15.5)
Lymphs Abs: 2006 cells/uL (ref 850–3900)
MCH: 31.5 pg (ref 27.0–33.0)
MCHC: 32.3 g/dL (ref 32.0–36.0)
MCV: 97.4 fL (ref 80.0–100.0)
MPV: 9.9 fL (ref 7.5–12.5)
Monocytes Relative: 8.7 %
Neutro Abs: 2793 cells/uL (ref 1500–7800)
Neutrophils Relative %: 49 %
Platelets: 218 10*3/uL (ref 140–400)
RBC: 3.46 10*6/uL — ABNORMAL LOW (ref 3.80–5.10)
RDW: 11.6 % (ref 11.0–15.0)
Total Lymphocyte: 35.2 %
WBC: 5.7 10*3/uL (ref 3.8–10.8)

## 2020-06-25 LAB — IRON,TIBC AND FERRITIN PANEL
%SAT: 32 % (calc) (ref 16–45)
Ferritin: 65 ng/mL (ref 16–288)
Iron: 89 ug/dL (ref 45–160)
TIBC: 281 mcg/dL (calc) (ref 250–450)

## 2020-06-25 LAB — BASIC METABOLIC PANEL WITH GFR
BUN/Creatinine Ratio: 23 (calc) — ABNORMAL HIGH (ref 6–22)
BUN: 22 mg/dL (ref 7–25)
CO2: 25 mmol/L (ref 20–32)
Calcium: 8.7 mg/dL (ref 8.6–10.4)
Chloride: 105 mmol/L (ref 98–110)
Creat: 0.96 mg/dL — ABNORMAL HIGH (ref 0.60–0.93)
GFR, Est African American: 67 mL/min/{1.73_m2} (ref >=60)
GFR, Est Non African American: 58 mL/min/{1.73_m2} — ABNORMAL LOW (ref >=60)
Glucose, Bld: 115 mg/dL — ABNORMAL HIGH (ref 65–99)
Potassium: 4.8 mmol/L (ref 3.5–5.3)
Sodium: 137 mmol/L (ref 135–146)

## 2020-06-25 LAB — T4, FREE: Free T4: 1.4 ng/dL (ref 0.8–1.8)

## 2020-06-25 LAB — TSH: TSH: 1.22 mIU/L (ref 0.40–4.50)

## 2020-06-25 LAB — HEMOGLOBIN A1C
Hgb A1c MFr Bld: 6.5 % of total Hgb — ABNORMAL HIGH (ref <5.7)
Mean Plasma Glucose: 140 mg/dL
eAG (mmol/L): 7.7 mmol/L

## 2020-07-01 ENCOUNTER — Other Ambulatory Visit: Payer: Self-pay | Admitting: Family Medicine

## 2020-07-01 ENCOUNTER — Other Ambulatory Visit: Payer: Self-pay

## 2020-07-01 ENCOUNTER — Encounter: Payer: Self-pay | Admitting: Family Medicine

## 2020-07-01 ENCOUNTER — Ambulatory Visit (INDEPENDENT_AMBULATORY_CARE_PROVIDER_SITE_OTHER): Payer: Medicare Other | Admitting: Family Medicine

## 2020-07-01 VITALS — BP 134/80 | HR 56 | Ht 67.0 in | Wt 216.4 lb

## 2020-07-01 DIAGNOSIS — E669 Obesity, unspecified: Secondary | ICD-10-CM

## 2020-07-01 DIAGNOSIS — Z9989 Dependence on other enabling machines and devices: Secondary | ICD-10-CM

## 2020-07-01 DIAGNOSIS — E890 Postprocedural hypothyroidism: Secondary | ICD-10-CM

## 2020-07-01 DIAGNOSIS — E1121 Type 2 diabetes mellitus with diabetic nephropathy: Secondary | ICD-10-CM | POA: Diagnosis not present

## 2020-07-01 DIAGNOSIS — D649 Anemia, unspecified: Secondary | ICD-10-CM

## 2020-07-01 DIAGNOSIS — F3340 Major depressive disorder, recurrent, in remission, unspecified: Secondary | ICD-10-CM | POA: Diagnosis not present

## 2020-07-01 DIAGNOSIS — Z0000 Encounter for general adult medical examination without abnormal findings: Secondary | ICD-10-CM

## 2020-07-01 DIAGNOSIS — I129 Hypertensive chronic kidney disease with stage 1 through stage 4 chronic kidney disease, or unspecified chronic kidney disease: Secondary | ICD-10-CM

## 2020-07-01 DIAGNOSIS — G4733 Obstructive sleep apnea (adult) (pediatric): Secondary | ICD-10-CM

## 2020-07-01 DIAGNOSIS — N183 Chronic kidney disease, stage 3 unspecified: Secondary | ICD-10-CM

## 2020-07-01 MED ORDER — METFORMIN HCL ER 500 MG PO TB24: 1500.0000 mg | ORAL_TABLET | Freq: Every day | ORAL | 3 refills | Status: DC

## 2020-07-01 MED ORDER — FLUOXETINE HCL 10 MG PO CAPS: 10.0000 mg | ORAL_CAPSULE | Freq: Every day | ORAL | 3 refills | Status: DC

## 2020-07-01 NOTE — Assessment & Plan Note (Signed)
Well controlled, chronic OSA on CPAP, now >10 years - Good adherence to CPAP nightly - Continue current CPAP therapy, patient seems to be benefiting from therapy  Will fax order CPAP supplies to [place] by her request

## 2020-07-01 NOTE — Progress Notes (Signed)
Subjective:    Patient ID: [name], female    DOB: 04/05/1945, 75 y.o.   MRN: [id number]  [name] is a 75 y.o. female presenting on 07/01/2020 for Diabetes   HPI   Updates - labs improved. She is on daily MVI Some wt gain Stressor with loss of husband Eduard Clos recently. Interval update with Left breast nodule / axillary, had mammogram diagnostic and ultrasound, identified it was more cystic structure. Awaiting to move in in house behind daughter, waiting on [place] for power.  CHRONIC DM, Type 2 with CKD-III/ Hypertension Last lab A1c 6.5 05/2020 She is doing well overall still on Ozempic 0.80m, has had good weight loss CBGs:stablemostlyAvg100-120,no low.High < 150. Checks CBGs x1 fasting AM Meds: - Metformin XR 15088mdaily x 3 in AM - Ozempic 0.31m60meekly inj- minimal nausea occasionally - Losartan 100m46mily, Carvedilol 3.1231mg41m, Amlodipine 2.31mg d34my Reports good compliance, tolerating well Currently on ARB, ASA 81, Statin Lifestyle: - weight back up, unchanged in 6 months -UTD DM Eye Exam 04/2019 Denies hypoglycemia, numbness tingling weakness  FOLLOW-UPHYPOTHYROIDISM, S/p surgical thyroidectomy Normalized thyroid panel. On Levothyroxine 1231mcg 66my  OSA, on CPAP - Patient reports prior history of dx OSA and on CPAP for >10 yr - Today reports that sleep apnea is well controlled. uses the CPAP machine every night. Tolerates the machine well, and thinks that sleeps better with it and feels good. No new concerns or symptoms. Needs new Tubing and Mask CPAP Supplies. [place] CAD / HTN heart disease Followed by [facility] Last visit cardiology added Zetia to her statin, rosuvastatin 20mg. D34m well on these with controlled Lipid results. She takes Aspirin 81mg dai10mAnemia, normocytic Last lab showed Mild anemia Hgb 10.9, normal MCV, stable from prior, normal anemia panel. No bleeding. She is on ASA  81.  Depression, mild chronic recurrent - In remission See PHQ score Currently doing well. Continues on SSRI, Fluoxetine 10mng dai2mhe increased to x 2 in past temporarily with loss of husband, but has reduce down to 1 a day again, she has good and bad days, overall stable and doing fairly well still.   Health Maintenance: [place] now after diagnostic  Cologuard negative 01/03/2017, now due for repeat before age 58. She de69ines colonoscopy at this time, due for Cologuard, was ordered last time, she has the kit waiting to send it.    Depression screen PHQ 2/9 5/[id number] 02/12/2020 01/02/2020  Decreased Interest 0 0 0  Down, Depressed, Hopeless 1 1 0  PHQ - 2 Score 1 1 0  Altered sleeping 1 - 1  Tired, decreased energy 1 - 1  Change in appetite 0 - 0  Feeling bad or failure about yourself  0 - 0  Trouble concentrating 0 - 0  Moving slowly or fidgety/restless 0 - 0  Suicidal thoughts 0 - 0  PHQ-9 Score 3 - 2  Difficult doing work/chores Somewhat difficult - Not difficult at all  Some recent data might be hidden    Social History   Tobacco Use  . Smoking status: Never Smoker  . Smokeless tobacco: Never Used  Vaping Use  . Vaping Use: Never used  Substance Use Topics  . Alcohol use: No  . Drug use: No    Review of Systems Per HPI unless specifically indicated above     Objective:    BP 134/80 (BP Location: Left Arm, Cuff Size: Normal)  Pulse (!) 56   Ht 5' 7"  (1.702 m)   Wt 216 lb 6.4 oz (98.2 kg)   SpO2 100%   BMI 33.89 kg/m   Wt Readings from Last 3 Encounters:  07/01/20 216 lb 6.4 oz (98.2 kg)  02/12/20 209 lb (94.8 kg)  01/29/20 216 lb 4 oz (98.1 kg)    Physical Exam Vitals and nursing note reviewed.  Constitutional:      General: She is not in acute distress.    Appearance: She is well-developed. She is not diaphoretic.     Comments: Well-appearing, comfortable, cooperative  HENT:     Head:  Normocephalic and atraumatic.  Eyes:     General:        Right eye: No discharge.        Left eye: No discharge.     Conjunctiva/sclera: Conjunctivae normal.  Neck:     Thyroid: No thyromegaly.  Cardiovascular:     Rate and Rhythm: Normal rate and regular rhythm.     Heart sounds: Normal heart sounds. No murmur heard.   Pulmonary:     Effort: Pulmonary effort is normal. No respiratory distress.     Breath sounds: Normal breath sounds. No wheezing or rales.  Musculoskeletal:        General: Normal range of motion.     Cervical back: Normal range of motion and neck supple.  Lymphadenopathy:     Cervical: No cervical adenopathy.  Skin:    General: Skin is warm and dry.     Findings: No erythema or rash.  Neurological:     Mental Status: She is alert and oriented to person, place, and time.  Psychiatric:        Behavior: Behavior normal.     Comments: Well groomed, good eye contact, normal speech and thoughts      Diabetic Foot Exam - Simple   Simple Foot Form Diabetic Foot exam was performed with the following findings: Yes 07/01/2020  9:31 AM  Visual Inspection No deformities, no ulcerations, no other skin breakdown bilaterally: Yes Sensation Testing Intact to touch and monofilament testing bilaterally: Yes Pulse Check Posterior Tibialis and Dorsalis pulse intact bilaterally: Yes Comments     I have personally reviewed the radiology report from 06/25/20 Ultrasound.  CLINICAL DATA:  Patient describes a palpable lump within the LEFT axilla. Patient was able to express fluid from the area last week with subsequent reduction in size.  EXAM: DIGITAL DIAGNOSTIC BILATERAL MAMMOGRAM WITH TOMOSYNTHESIS AND CAD; ULTRASOUND LEFT AXILLA LIMITED  TECHNIQUE: Bilateral digital diagnostic mammography and breast tomosynthesis was performed. The images were evaluated with computer-aided detection.; Targeted ultrasound examination of the left axilla was performed  COMPARISON:   Previous exam(s).  ACR Breast Density Category b: There are scattered areas of fibroglandular density.  FINDINGS: Bilateral diagnostic mammogram: There are no new dominant masses, suspicious calcifications or secondary signs of malignancy within either breast.  There is a partially obscured mass within the LEFT axilla, likely at the skin based on tomosynthesis slice position, measuring approximately 1 cm greatest dimension.  Targeted ultrasound is performed, evaluating the LEFT axilla as directed by the patient, showing a complicated sebaceous cyst localized to the skin of the LEFT axilla, measuring 6 mm greatest dimension, with tract to the skin surface, corresponding to the palpable area of concern. No mass or fluid collection/abscess within the soft tissues underlying the skin. No enlarged or morphologically abnormal lymph nodes are identified.  IMPRESSION: 1. Benign complicated sebaceous cyst localized to  the skin of the LEFT axilla, measuring 6 mm, corresponding to the palpable area of concern. 2. No evidence of malignancy within either breast.  RECOMMENDATION: 1. Patient was reassured that sebaceous cysts are benign findings and typically self-limiting. Patient was informed that warm compresses can expedite healing. The patient was instructed to return if the area that she feels becomes larger or firmer to palpation. Patient was encouraged to follow-up with referring physician if any associated skin redness, skin warmth, or pain developed at the site as surgical consultation might then be needed for definitive treatment. 2.  Screening mammogram in one year.(Code:SM-B-01Y)  I have discussed the findings and recommendations with the patient. If applicable, a reminder letter will be sent to the patient regarding the next appointment.  BI-RADS CATEGORY  2: Benign.   Electronically Signed   By: Franki Cabot M.D.   On: 06/25/2020 15:14  Results for orders  placed or performed in visit on 06/24/20  T4, free  Result Value Ref Range   Free T4 1.4 0.8 - 1.8 ng/dL  TSH  Result Value Ref Range   TSH 1.22 0.40 - 4.50 mIU/L  Iron, TIBC and Ferritin Panel  Result Value Ref Range   Iron 89 45 - 160 mcg/dL   TIBC 281 250 - 450 mcg/dL (calc)   %SAT 32 16 - 45 % (calc)   Ferritin 65 16 - 288 ng/mL  BASIC METABOLIC PANEL WITH GFR  Result Value Ref Range   Glucose, Bld 115 (H) 65 - 99 mg/dL   BUN 22 7 - 25 mg/dL   Creat 0.96 (H) 0.60 - 0.93 mg/dL   GFR, Est Non African American 58 (L) > OR = 60 mL/min/1.36m   GFR, Est African American 67 > OR = 60 mL/min/1.785m  BUN/Creatinine Ratio 23 (H) 6 - 22 (calc)   Sodium 137 135 - 146 mmol/L   Potassium 4.8 3.5 - 5.3 mmol/L   Chloride 105 98 - 110 mmol/L   CO2 25 20 - 32 mmol/L   Calcium 8.7 8.6 - 10.4 mg/dL  CBC with Differential/Platelet  Result Value Ref Range   WBC 5.7 3.8 - 10.8 Thousand/uL   RBC 3.46 (L) 3.80 - 5.10 Million/uL   Hemoglobin 10.9 (L) 11.7 - 15.5 g/dL   HCT 33.7 (L) 35.0 - 45.0 %   MCV 97.4 80.0 - 100.0 fL   MCH 31.5 27.0 - 33.0 pg   MCHC 32.3 32.0 - 36.0 g/dL   RDW 11.6 11.0 - 15.0 %   Platelets 218 140 - 400 Thousand/uL   MPV 9.9 7.5 - 12.5 fL   Neutro Abs 2,793 1,500 - 7,800 cells/uL   Lymphs Abs 2,006 850 - 3,900 cells/uL   Absolute Monocytes 496 200 - 950 cells/uL   Eosinophils Absolute 353 15 - 500 cells/uL   Basophils Absolute 51 0 - 200 cells/uL   Neutrophils Relative % 49 %   Total Lymphocyte 35.2 %   Monocytes Relative 8.7 %   Eosinophils Relative 6.2 %   Basophils Relative 0.9 %  Hemoglobin A1c  Result Value Ref Range   Hgb A1c MFr Bld 6.5 (H) <5.7 % of total Hgb   Mean Plasma Glucose 140 mg/dL   eAG (mmol/L) 7.7 mmol/L      Assessment & Plan:   Problem List Items Addressed This Visit    Obstructive sleep apnea on CPAP    Well controlled, chronic OSA on CPAP, now >10 years - Good adherence to CPAP nightly -  Continue current CPAP therapy, patient  seems to be benefiting from therapy  Will fax order CPAP supplies to [place] by her request      Obesity (BMI 30.0-34.9)    Continue management with diet lifestyle and GLP1      Major depressive disorder, recurrent, in remission ([place])    Currently stable in remission Managed on SSRI Recent life stressor loss of husband. She is coping well with good support system family and counseling/bereavement from hospice      Relevant Medications   FLUoxetine (PROZAC) 10 MG capsule   Hypothyroidism, postop    Asymptomatic. Controlled Labs normal. Continue current Levothyroxine 18mg daily for now Follow-up 6 months with labs thyroid physical      Controlled type 2 diabetes mellitus with diabetic nephropathy ([place]    Well controlled A1c 6.5 still improving, lifestyle and weight loss on GLP1 Complications - CKD-III Hyperlipidemia, Hypothyroidism Prior meds: Januvia, insulin  Plan:  1. CONTINUE Ozempic 0.527mweekly inj - we do not have sample today, but can obtain some. Will notify her ASAP once sample has arrived, should be later today or tomorrow. - Continue Metformin XR 50015m 3 daily 2. Encourage improved lifestyle - low carb, low sugar diet, reduce portion size, continue improving regular exercise 3. Check CBG, bring log to next visit for review 4. Continue ASA, ARB, Statin UTD DM Eye 04/2019      Relevant Medications   metFORMIN (GLUCOPHAGE-XR) 500 MG 24 hr tablet       Meds ordered this encounter  Medications  . FLUoxetine (PROZAC) 10 MG capsule    Sig: Take 1 capsule (10 mg total) by mouth daily.    Dispense:  90 capsule    Refill:  3    Requesting 1 year supply  . metFORMIN (GLUCOPHAGE-XR) 500 MG 24 hr tablet    Sig: Take 3 tablets (1,500 mg total) by mouth daily with breakfast.    Dispense:  270 tablet    Refill:  3    Requesting 1 year supply      Follow up plan: Return in about 6 months (around 01/01/2021) for 6 month fasting lab  only then 1 week later Annual Physical.  Future labs ordered for 12/30/20   AleNobie PutnamO [place] 07/01/2020, 9:19 AM

## 2020-07-01 NOTE — Assessment & Plan Note (Signed)
Continue management with diet lifestyle and GLP1

## 2020-07-01 NOTE — Assessment & Plan Note (Signed)
Currently stable in remission Managed on SSRI Recent life stressor loss of husband. She is coping well with good support system family and counseling/bereavement from hospice

## 2020-07-01 NOTE — Assessment & Plan Note (Signed)
Asymptomatic. Controlled Labs normal. Continue current Levothyroxine 171mcg daily for now Follow-up 6 months with labs thyroid physical

## 2020-07-01 NOTE — Patient Instructions (Addendum)
Thank you for coming to the office today.  Will work on ordering CPAP supplies  Ozempic sample.  Labs look good  Refilled meds.  DUE for FASTING BLOOD WORK (no food or drink after midnight before the lab appointment, only water or coffee without cream/sugar on the morning of)  SCHEDULE "Lab Only" visit in the morning at the clinic for lab draw in 6 MONTHS   - Make sure Lab Only appointment is at about 1 week before your next appointment, so that results will be available  For Lab Results, once available within 2-3 days of blood draw, you can can log in to MyChart online to view your results and a brief explanation. Also, we can discuss results at next follow-up visit.    Please schedule a Follow-up Appointment to: Return in about 6 months (around 01/01/2021) for 6 month fasting lab only then 1 week later Annual Physical.  If you have any other questions or concerns, please feel free to call the office or send a message through [place]. You may also schedule an earlier appointment if necessary.  Additionally, you may be receiving a survey about your experience at our office within a few days to 1 week by e-mail or mail. We value your feedback.  Nobie Putnam, DO [place]

## 2020-07-01 NOTE — Assessment & Plan Note (Signed)
Well controlled A1c 6.5 still improving, lifestyle and weight loss on GLP1 Complications - CKD-III Hyperlipidemia, Hypothyroidism Prior meds: Januvia, insulin  Plan:  1. CONTINUE Ozempic 0.5mg  weekly inj - we do not have sample today, but can obtain some. Will notify her ASAP once sample has arrived, should be later today or tomorrow. - Continue Metformin XR 500mg  x 3 daily 2. Encourage improved lifestyle - low carb, low sugar diet, reduce portion size, continue improving regular exercise 3. Check CBG, bring log to next visit for review 4. Continue ASA, ARB, Statin UTD DM Eye 04/2019

## 2020-10-02 DIAGNOSIS — M170 Bilateral primary osteoarthritis of knee: Secondary | ICD-10-CM | POA: Diagnosis not present

## 2020-10-07 DIAGNOSIS — M170 Bilateral primary osteoarthritis of knee: Secondary | ICD-10-CM | POA: Diagnosis not present

## 2020-10-07 DIAGNOSIS — M25661 Stiffness of right knee, not elsewhere classified: Secondary | ICD-10-CM | POA: Diagnosis not present

## 2020-10-07 DIAGNOSIS — M174 Other bilateral secondary osteoarthritis of knee: Secondary | ICD-10-CM | POA: Diagnosis not present

## 2020-10-07 DIAGNOSIS — M25562 Pain in left knee: Secondary | ICD-10-CM | POA: Diagnosis not present

## 2020-10-07 DIAGNOSIS — R262 Difficulty in walking, not elsewhere classified: Secondary | ICD-10-CM | POA: Diagnosis not present

## 2020-10-07 DIAGNOSIS — M25662 Stiffness of left knee, not elsewhere classified: Secondary | ICD-10-CM | POA: Diagnosis not present

## 2020-10-07 DIAGNOSIS — M25561 Pain in right knee: Secondary | ICD-10-CM | POA: Diagnosis not present

## 2020-10-09 DIAGNOSIS — M25562 Pain in left knee: Secondary | ICD-10-CM | POA: Diagnosis not present

## 2020-10-09 DIAGNOSIS — M25661 Stiffness of right knee, not elsewhere classified: Secondary | ICD-10-CM | POA: Diagnosis not present

## 2020-10-09 DIAGNOSIS — M170 Bilateral primary osteoarthritis of knee: Secondary | ICD-10-CM | POA: Diagnosis not present

## 2020-10-09 DIAGNOSIS — M25561 Pain in right knee: Secondary | ICD-10-CM | POA: Diagnosis not present

## 2020-10-09 DIAGNOSIS — R262 Difficulty in walking, not elsewhere classified: Secondary | ICD-10-CM | POA: Diagnosis not present

## 2020-10-09 DIAGNOSIS — M25662 Stiffness of left knee, not elsewhere classified: Secondary | ICD-10-CM | POA: Diagnosis not present

## 2020-10-09 DIAGNOSIS — M174 Other bilateral secondary osteoarthritis of knee: Secondary | ICD-10-CM | POA: Diagnosis not present

## 2020-10-14 DIAGNOSIS — M25662 Stiffness of left knee, not elsewhere classified: Secondary | ICD-10-CM | POA: Diagnosis not present

## 2020-10-14 DIAGNOSIS — R262 Difficulty in walking, not elsewhere classified: Secondary | ICD-10-CM | POA: Diagnosis not present

## 2020-10-14 DIAGNOSIS — M25661 Stiffness of right knee, not elsewhere classified: Secondary | ICD-10-CM | POA: Diagnosis not present

## 2020-10-14 DIAGNOSIS — M25562 Pain in left knee: Secondary | ICD-10-CM | POA: Diagnosis not present

## 2020-10-14 DIAGNOSIS — M170 Bilateral primary osteoarthritis of knee: Secondary | ICD-10-CM | POA: Diagnosis not present

## 2020-10-14 DIAGNOSIS — M25561 Pain in right knee: Secondary | ICD-10-CM | POA: Diagnosis not present

## 2020-10-14 DIAGNOSIS — M174 Other bilateral secondary osteoarthritis of knee: Secondary | ICD-10-CM | POA: Diagnosis not present

## 2020-10-17 DIAGNOSIS — M170 Bilateral primary osteoarthritis of knee: Secondary | ICD-10-CM | POA: Diagnosis not present

## 2020-10-17 DIAGNOSIS — M25661 Stiffness of right knee, not elsewhere classified: Secondary | ICD-10-CM | POA: Diagnosis not present

## 2020-10-17 DIAGNOSIS — R262 Difficulty in walking, not elsewhere classified: Secondary | ICD-10-CM | POA: Diagnosis not present

## 2020-10-17 DIAGNOSIS — M174 Other bilateral secondary osteoarthritis of knee: Secondary | ICD-10-CM | POA: Diagnosis not present

## 2020-10-17 DIAGNOSIS — M25561 Pain in right knee: Secondary | ICD-10-CM | POA: Diagnosis not present

## 2020-10-17 DIAGNOSIS — M25562 Pain in left knee: Secondary | ICD-10-CM | POA: Diagnosis not present

## 2020-10-17 DIAGNOSIS — M25662 Stiffness of left knee, not elsewhere classified: Secondary | ICD-10-CM | POA: Diagnosis not present

## 2020-10-21 DIAGNOSIS — M25661 Stiffness of right knee, not elsewhere classified: Secondary | ICD-10-CM | POA: Diagnosis not present

## 2020-10-21 DIAGNOSIS — M25562 Pain in left knee: Secondary | ICD-10-CM | POA: Diagnosis not present

## 2020-10-21 DIAGNOSIS — H40003 Preglaucoma, unspecified, bilateral: Secondary | ICD-10-CM | POA: Diagnosis not present

## 2020-10-21 DIAGNOSIS — M25561 Pain in right knee: Secondary | ICD-10-CM | POA: Diagnosis not present

## 2020-10-21 DIAGNOSIS — M25662 Stiffness of left knee, not elsewhere classified: Secondary | ICD-10-CM | POA: Diagnosis not present

## 2020-10-21 DIAGNOSIS — M174 Other bilateral secondary osteoarthritis of knee: Secondary | ICD-10-CM | POA: Diagnosis not present

## 2020-10-21 DIAGNOSIS — M170 Bilateral primary osteoarthritis of knee: Secondary | ICD-10-CM | POA: Diagnosis not present

## 2020-10-21 DIAGNOSIS — R262 Difficulty in walking, not elsewhere classified: Secondary | ICD-10-CM | POA: Diagnosis not present

## 2020-10-30 DIAGNOSIS — M25662 Stiffness of left knee, not elsewhere classified: Secondary | ICD-10-CM | POA: Diagnosis not present

## 2020-10-30 DIAGNOSIS — M25661 Stiffness of right knee, not elsewhere classified: Secondary | ICD-10-CM | POA: Diagnosis not present

## 2020-10-30 DIAGNOSIS — M25562 Pain in left knee: Secondary | ICD-10-CM | POA: Diagnosis not present

## 2020-10-30 DIAGNOSIS — M174 Other bilateral secondary osteoarthritis of knee: Secondary | ICD-10-CM | POA: Diagnosis not present

## 2020-10-30 DIAGNOSIS — M170 Bilateral primary osteoarthritis of knee: Secondary | ICD-10-CM | POA: Diagnosis not present

## 2020-10-30 DIAGNOSIS — R262 Difficulty in walking, not elsewhere classified: Secondary | ICD-10-CM | POA: Diagnosis not present

## 2020-10-30 DIAGNOSIS — M25561 Pain in right knee: Secondary | ICD-10-CM | POA: Diagnosis not present

## 2020-11-04 DIAGNOSIS — M25661 Stiffness of right knee, not elsewhere classified: Secondary | ICD-10-CM | POA: Diagnosis not present

## 2020-11-04 DIAGNOSIS — M174 Other bilateral secondary osteoarthritis of knee: Secondary | ICD-10-CM | POA: Diagnosis not present

## 2020-11-04 DIAGNOSIS — R262 Difficulty in walking, not elsewhere classified: Secondary | ICD-10-CM | POA: Diagnosis not present

## 2020-11-04 DIAGNOSIS — M25562 Pain in left knee: Secondary | ICD-10-CM | POA: Diagnosis not present

## 2020-11-04 DIAGNOSIS — M170 Bilateral primary osteoarthritis of knee: Secondary | ICD-10-CM | POA: Diagnosis not present

## 2020-11-04 DIAGNOSIS — M25662 Stiffness of left knee, not elsewhere classified: Secondary | ICD-10-CM | POA: Diagnosis not present

## 2020-11-04 DIAGNOSIS — M25561 Pain in right knee: Secondary | ICD-10-CM | POA: Diagnosis not present

## 2020-11-06 DIAGNOSIS — M25562 Pain in left knee: Secondary | ICD-10-CM | POA: Diagnosis not present

## 2020-11-06 DIAGNOSIS — M170 Bilateral primary osteoarthritis of knee: Secondary | ICD-10-CM | POA: Diagnosis not present

## 2020-11-06 DIAGNOSIS — R262 Difficulty in walking, not elsewhere classified: Secondary | ICD-10-CM | POA: Diagnosis not present

## 2020-11-06 DIAGNOSIS — M25661 Stiffness of right knee, not elsewhere classified: Secondary | ICD-10-CM | POA: Diagnosis not present

## 2020-11-06 DIAGNOSIS — M25662 Stiffness of left knee, not elsewhere classified: Secondary | ICD-10-CM | POA: Diagnosis not present

## 2020-11-06 DIAGNOSIS — M174 Other bilateral secondary osteoarthritis of knee: Secondary | ICD-10-CM | POA: Diagnosis not present

## 2020-11-06 DIAGNOSIS — M25561 Pain in right knee: Secondary | ICD-10-CM | POA: Diagnosis not present

## 2020-11-11 DIAGNOSIS — M174 Other bilateral secondary osteoarthritis of knee: Secondary | ICD-10-CM | POA: Diagnosis not present

## 2020-11-11 DIAGNOSIS — M170 Bilateral primary osteoarthritis of knee: Secondary | ICD-10-CM | POA: Diagnosis not present

## 2020-11-11 DIAGNOSIS — R262 Difficulty in walking, not elsewhere classified: Secondary | ICD-10-CM | POA: Diagnosis not present

## 2020-11-11 DIAGNOSIS — M25662 Stiffness of left knee, not elsewhere classified: Secondary | ICD-10-CM | POA: Diagnosis not present

## 2020-11-11 DIAGNOSIS — M25661 Stiffness of right knee, not elsewhere classified: Secondary | ICD-10-CM | POA: Diagnosis not present

## 2020-11-11 DIAGNOSIS — M25561 Pain in right knee: Secondary | ICD-10-CM | POA: Diagnosis not present

## 2020-11-11 DIAGNOSIS — M25562 Pain in left knee: Secondary | ICD-10-CM | POA: Diagnosis not present

## 2020-11-13 DIAGNOSIS — R262 Difficulty in walking, not elsewhere classified: Secondary | ICD-10-CM | POA: Diagnosis not present

## 2020-11-13 DIAGNOSIS — M25662 Stiffness of left knee, not elsewhere classified: Secondary | ICD-10-CM | POA: Diagnosis not present

## 2020-11-13 DIAGNOSIS — M174 Other bilateral secondary osteoarthritis of knee: Secondary | ICD-10-CM | POA: Diagnosis not present

## 2020-11-13 DIAGNOSIS — M25561 Pain in right knee: Secondary | ICD-10-CM | POA: Diagnosis not present

## 2020-11-13 DIAGNOSIS — M25661 Stiffness of right knee, not elsewhere classified: Secondary | ICD-10-CM | POA: Diagnosis not present

## 2020-11-13 DIAGNOSIS — M25562 Pain in left knee: Secondary | ICD-10-CM | POA: Diagnosis not present

## 2020-11-13 DIAGNOSIS — M170 Bilateral primary osteoarthritis of knee: Secondary | ICD-10-CM | POA: Diagnosis not present

## 2020-11-14 DIAGNOSIS — M1711 Unilateral primary osteoarthritis, right knee: Secondary | ICD-10-CM | POA: Diagnosis not present

## 2020-11-17 ENCOUNTER — Telehealth: Payer: Self-pay | Admitting: Cardiovascular Disease

## 2020-11-17 NOTE — Telephone Encounter (Signed)
Pt agreeable to plan of care for pre op appt. Pt scheduled to see Cadence Kathlen Mody, [facility] 11/26/20 @ 11 am for pre op clearance. Pt is grateful for the appt. I will send notes to [facility] for appt Will send FYI to surgeon's office pt has appt.

## 2020-11-17 NOTE — Telephone Encounter (Signed)
Primary Cardiologist:Timothy Rockey Situ, MD  Chart reviewed as part of pre-operative protocol coverage. Because of [name]'s past medical history and time since last visit, he/she will require a follow-up visit in order to better assess preoperative cardiovascular risk.  Pre-op covering staff: - Please schedule appointment and call patient to inform them. - Please contact requesting surgeon's office via preferred method (i.e, phone, fax) to inform them of need for appointment prior to surgery.  If applicable, this message will also be routed to pharmacy pool and/or primary cardiologist for input on holding anticoagulant/antiplatelet agent as requested below so that this information is available at time of patient's appointment.   Deberah Pelton, NP  11/17/2020, 3:00 PM

## 2020-11-17 NOTE — Telephone Encounter (Signed)
   [facility] Pre-operative Risk Assessment    Patient Name: [name]  DOB: 1945/11/01 MRN: [id number]  HEARTCARE STAFF:  - IMPORTANT!!!!!! Under Visit Info/Reason for Call, type in Other and utilize the format Clearance MM/DD/YY or Clearance TBD. Do not use dashes or single digits. - Please review there is not already an duplicate clearance open for this procedure. - If request is for dental extraction, please clarify the # of teeth to be extracted. - If the patient is currently at the dentist's office, call Pre-Op Callback Staff (MA/nurse) to input urgent request.  - If the patient is not currently in the dentist office, please route to the Pre-Op pool.  Request for surgical clearance:  What type of surgery is being performed? Rt TKA  When is this surgery scheduled? 12-18-20  What type of clearance is required (medical clearance vs. Pharmacy clearance to hold med vs. Both)? both  Are there any medications that need to be held prior to surgery and how long? Please advise  Practice name and name of physician performing surgery? [facility] Dr. Mack Guise  What is the office phone number? [phone]   7.   What is the office fax number? [phone]  8.   Anesthesia type (None, local, MAC, general) ? choice   Clarisse Gouge 11/17/2020, 9:23 AM  _________________________________________________________________   (provider comments below)

## 2020-11-19 ENCOUNTER — Encounter: Payer: Self-pay | Admitting: Family Medicine

## 2020-11-21 DIAGNOSIS — M174 Other bilateral secondary osteoarthritis of knee: Secondary | ICD-10-CM | POA: Diagnosis not present

## 2020-11-21 DIAGNOSIS — M25662 Stiffness of left knee, not elsewhere classified: Secondary | ICD-10-CM | POA: Diagnosis not present

## 2020-11-21 DIAGNOSIS — M25562 Pain in left knee: Secondary | ICD-10-CM | POA: Diagnosis not present

## 2020-11-21 DIAGNOSIS — M25661 Stiffness of right knee, not elsewhere classified: Secondary | ICD-10-CM | POA: Diagnosis not present

## 2020-11-21 DIAGNOSIS — R262 Difficulty in walking, not elsewhere classified: Secondary | ICD-10-CM | POA: Diagnosis not present

## 2020-11-21 DIAGNOSIS — M170 Bilateral primary osteoarthritis of knee: Secondary | ICD-10-CM | POA: Diagnosis not present

## 2020-11-21 DIAGNOSIS — M25561 Pain in right knee: Secondary | ICD-10-CM | POA: Diagnosis not present

## 2020-11-25 NOTE — Progress Notes (Signed)
Cardiology Office Note:    Date:  11/26/2020   ID:  [name], DOB 09-Aug-1945, MRN [id number]  PCP:  Grace Hauser, DO  [facility] Cardiologist:  Grace Rogue, MD  [place] Electrophysiologist:  None   Referring MD: [name] *   Chief Complaint: Pre-op cardiac evaluation  History of Present Illness:    [name] is a 75 y.o. female with a hx of CAD June 2015, anterior STEMI in [place] with thrombectomy and DES to LAD, Cardiomyopathy EF 45% with normalization of LV function 55-60%, HTN, OSA on CPAP, DM2, HLD, diabetes, morbid obesity who presents for pre-op evaluation for R knee replacement.   Last seen 01/29/20 and was doing ok from a cardiac perspective.   Today, patient reports she is having R knee replacement October 20th. Has bone and bone and chronic pain, has needed it for a long time. Able to walk, but not long distance. Can stand 5 -10 minutes. Can walk up stairs and perform daily activities. Husband passed away in 04-May-2022 from [place]. Patient denies exertional chest pain or shortness of breath. She has lost a lot of weight, about 80lbs since the last heart attack. Has occasional LLE, dependent edema. No orthopnea or pnd. She uses a CPAP. EKG with SB 58bpm,  TWI aVL. No dizziness or lightheadedness. Most recent A1C 6.5. Small murmur on exam.   Past Medical History:  Diagnosis Date   Arthritis    knees    Coronary artery disease    a. 07/2013 STEMI/PCI ([place]): LM nl, LAD 100p (thrombectomy & 3.5x23 [place] DES), LCX nl, RCA nl, EF 45%.   Endometrial cancer ([place])    THYROID CANCER   Gravida 3 para 3    Hyperlipidemia    Hypertension    Hypertensive heart disease    Hypothyroid    Ischemic cardiomyopathy    a. 07/2013 EF 45% @ time of STEMI; b. 11/2013 Echo: EF 55-60%, mildly dil LA, nl RV fxn.   Menopause    Myocardial infarction ([place])    2016   Obstructive sleep apnea on CPAP    Osteoarthritis     Past Surgical History:   Procedure Laterality Date   ABDOMINAL HYSTERECTOMY     CARDIAC CATHETERIZATION     CATARACT EXTRACTION  08/30/2016   CATARACT EXTRACTION W/PHACO Left 08/30/2016   Procedure: CATARACT EXTRACTION PHACO AND INTRAOCULAR LENS PLACEMENT ([place]) Left daibetic;  Surgeon: Leandrew Koyanagi, MD;  Location: [facility];  Service: Ophthalmology;  Laterality: Left;  Diabetic - insulin and oral meds sleep apnea   CATARACT EXTRACTION W/PHACO Right 10/07/2016   Procedure: CATARACT EXTRACTION PHACO AND INTRAOCULAR LENS PLACEMENT ([place]);  Surgeon: Leandrew Koyanagi, MD;  Location: [facility];  Service: Ophthalmology;  Laterality: Right;  Lot# [id number] H [place]: 00:43.3 AP%: 12.2 CDE: 5.28   CORONARY ANGIOPLASTY     drug eluting stent placed at [facility]      Current Medications: Current Meds  Medication Sig   ACCU-CHEK AVIVA PLUS test strip U UTD BID   acetaminophen (TYLENOL) 500 MG tablet Take 2 tablets by mouth 3 (three) times daily as needed.   amLODipine (NORVASC) 2.5 MG tablet Take 1 tablet (2.5 mg total) by mouth daily.   aspirin 81 MG tablet Take 81 mg by mouth daily.   carvedilol (COREG) 3.125 MG tablet Take 1 tablet (3.125 mg total) by mouth 2 (two)  times daily.   ezetimibe (ZETIA) 10 MG tablet Take 1 tablet (10 mg total) by mouth daily.   FLUoxetine (PROZAC) 10 MG capsule Take 1 capsule (10 mg total) by mouth daily.   fluticasone (FLONASE) 50 MCG/ACT nasal spray Place 2 sprays into both nostrils daily.   levothyroxine (SYNTHROID) 125 MCG tablet TAKE 1 TABLET BY MOUTH  DAILY BEFORE BREAKFAST   losartan (COZAAR) 100 MG tablet Take 1 tablet (100 mg total) by mouth daily.   metFORMIN (GLUCOPHAGE-XR) 500 MG 24 hr tablet Take 3 tablets (1,500 mg total) by mouth daily with breakfast.   Multiple Vitamin (MULTIVITAMIN WITH MINERALS) TABS tablet Take 1 tablet by mouth daily.   nitroGLYCERIN (NITROSTAT) 0.4 MG SL  tablet Place 1 tablet (0.4 mg total) under the tongue every 5 (five) minutes as needed for chest pain.   OZEMPIC, 0.25 OR 0.5 MG/DOSE, 2 MG/1.5ML SOPN INJECT 0.5MG  SUBCUTANEOUSLY ONCE WEEKLY AS DIRECTED   rosuvastatin (CRESTOR) 20 MG tablet Take 1 tablet (20 mg total) by mouth daily.     Allergies:   Patient has no known allergies.   Social History   Socioeconomic History   Marital status: Married    Spouse name: Not on file   Number of children: Not on file   Years of education: Not on file   Highest education level: Not on file  Occupational History   Occupation: retired  Tobacco Use   Smoking status: Never   Smokeless tobacco: Never  Vaping Use   Vaping Use: Never used  Substance and Sexual Activity   Alcohol use: No   Drug use: No   Sexual activity: Not on file  Other Topics Concern   Not on file  Social History Narrative   Not on file   Social Determinants of Health   Financial Resource Strain: Low Risk    Difficulty of Paying Living Expenses: Not hard at all  Food Insecurity: No Food Insecurity   Worried About Charity fundraiser in the Last Year: Never true   [place] in the Last Year: Never true  Transportation Needs: No Transportation Needs   Lack of Transportation (Medical): No   Lack of Transportation (Non-Medical): No  Physical Activity: Inactive   Days of Exercise per Week: 0 days   Minutes of Exercise per Session: 0 min  Stress: No Stress Concern Present   Feeling of Stress : Only a little  Social Connections: Not on file     Family History: The patient's family history includes Heart attack in her father; Heart disease (age of onset: 52) in her father; Heart failure in her father; Hyperlipidemia in her father; Hypertension in her father; Pulmonary fibrosis in her son. There is no history of Breast cancer.  ROS:   Please see the history of present illness.     All other systems reviewed and are negative.  EKGs/Labs/Other Studies Reviewed:     The following studies were reviewed today:  Echo 2017 Study Conclusions   - Left ventricle: The cavity size was normal. There was mild    concentric hypertrophy. Systolic function was normal. The    estimated ejection fraction was in the range of 55% to 60%. Wall    motion was normal; there were no regional wall motion    abnormalities. The study is not technically sufficient to allow    evaluation of LV diastolic function.  - Left atrium: The atrium was mildly dilated.  - Right ventricle: The cavity size was  dilated. Wall thickness was    normal.    EKG:  EKG is  ordered today.  The ekg ordered today demonstrates SB 58bpm, TWI aVL, low voltage, no significant change.   Recent Labs: 12/26/2019: ALT 8 06/24/2020: BUN 22; Creat 0.96; Hemoglobin 10.9; Platelets 218; Potassium 4.8; Sodium 137; TSH 1.22  Recent Lipid Panel    Component Value Date/Time   CHOL 115 12/26/2019 0851   CHOL 160 11/21/2014 0951   TRIG 71 12/26/2019 0851   HDL 57 12/26/2019 0851   HDL 56 11/21/2014 0951   CHOLHDL 2.0 12/26/2019 0851   VLDL 30 12/11/2015 1528   LDLCALC 43 12/26/2019 0851    Physical Exam:    VS:  BP 124/72 (BP Location: Left Arm, Patient Position: Sitting, Cuff Size: Normal)   Pulse (!) 58   Ht 5\' 7"  (1.702 m)   Wt 225 lb (102.1 kg)   SpO2 98%   BMI 35.24 kg/m     Wt Readings from Last 3 Encounters:  11/26/20 225 lb (102.1 kg)  07/01/20 216 lb 6.4 oz (98.2 kg)  02/12/20 209 lb (94.8 kg)     GEN:  Well nourished, well developed in no acute distress HEENT: Normal NECK: No JVD; No carotid bruits LYMPHATICS: No lymphadenopathy CARDIAC: Bradycardia, RR, + murmur, no rubs, gallops RESPIRATORY:  Clear to auscultation without rales, wheezing or rhonchi  ABDOMEN: Soft, non-tender, non-distended MUSCULOSKELETAL:  No edema; No deformity  SKIN: Warm and dry NEUROLOGIC:  Alert and oriented x 3 PSYCHIATRIC:  Normal affect   ASSESSMENT:    1. Pre-operative cardiovascular  examination   2. Coronary artery disease of native artery of native heart with stable angina pectoris ([place])   3. Ischemic cardiomyopathy   4. Type 2 diabetes mellitus with other circulatory complication, with long-term current use of insulin ([place])   5. Chronic kidney disease (CKD) stage G3a/A1, moderately decreased glomerular filtration rate (GFR) between 45-59 mL/min/1.73 square meter and albuminuria creatinine ratio less than 30 mg/g (HCC)   6. Morbid obesity with BMI of 40.0-44.9, adult ([place])   7. Hyperlipidemia associated with type 2 diabetes mellitus ([place])    PLAN:    In order of problems listed above:  CAD Patient denies anginal symptoms. EKG with no acute ischemic changes. Since her last stent in 2015 she has lost about 80lbs. Function is limited by R knee issues. Continue Aspirin, Coreg, Zetia, Statin, SL NTG.  HTN BP today good. Continue amlodipine 10mg  daily, Coreg 3.125mg  BID, Losartan 100mg  daily.   HLD Continue Zetia and Crestor. LDL 43 11/2019.  DM2 Most recent A1C 6.5. She is on metformin and Ozempic, followed by PCP.   CKD sage 3 Kidney function stable by labs in 05/2020. Avoid nephrotoxic agents.   OSA on CPAP She reports compliance with CPAP  Obesity As above, she has lost 80lbs since 2015. She was congratulated  Murmur Murmur on exam, will order echo. This can be scheduled prior to surgery, however would not post-pone surgery given she has no CHF symptoms.   ICM with improvement of EF Most recent echo 2017 showed improvement of EF to 55-60%, mild hypertrophy, no WMA, mildly dilated LA. She is euvolemic on exam. Will order echo as above to evaluate murmur. Continue Coreg and losartan.   Pre-op eval Patient is stable from a cardiac perspective with no anginal symptoms or CHF symptoms. Last echo in 2017 showed improved EF to 55-60%. Will get echo for murmur as above, however this should not post-pone surgery.  Vitals stable. EKG without new ischemic changes.  Patient's function is limited due to right knee pain, however METS still >4. According to the Revised cardiac Risk Index she is Class 3 risk at 10.1% for 30 day risk of death, MI or cardiac arrest. Patient is at an acceptable risk for surgery.   Disposition: Follow up in 6 month(s) with MD/APP   Signed, Kamdin Follett Ninfa Meeker, PA-C  11/26/2020 1:49 PM    [facility]

## 2020-11-26 ENCOUNTER — Other Ambulatory Visit: Payer: Self-pay

## 2020-11-26 ENCOUNTER — Ambulatory Visit: Payer: Medicare Other | Admitting: Medical

## 2020-11-26 ENCOUNTER — Encounter: Payer: Self-pay | Admitting: Medical

## 2020-11-26 ENCOUNTER — Ambulatory Visit (INDEPENDENT_AMBULATORY_CARE_PROVIDER_SITE_OTHER): Payer: Medicare Other

## 2020-11-26 VITALS — BP 124/72 | HR 58 | Ht 67.0 in | Wt 225.0 lb

## 2020-11-26 DIAGNOSIS — I25118 Atherosclerotic heart disease of native coronary artery with other forms of angina pectoris: Secondary | ICD-10-CM

## 2020-11-26 DIAGNOSIS — N1831 Chronic kidney disease, stage 3a: Secondary | ICD-10-CM | POA: Diagnosis not present

## 2020-11-26 DIAGNOSIS — E1159 Type 2 diabetes mellitus with other circulatory complications: Secondary | ICD-10-CM

## 2020-11-26 DIAGNOSIS — I255 Ischemic cardiomyopathy: Secondary | ICD-10-CM | POA: Diagnosis not present

## 2020-11-26 DIAGNOSIS — E785 Hyperlipidemia, unspecified: Secondary | ICD-10-CM

## 2020-11-26 DIAGNOSIS — Z01810 Encounter for preprocedural cardiovascular examination: Secondary | ICD-10-CM

## 2020-11-26 DIAGNOSIS — Z794 Long term (current) use of insulin: Secondary | ICD-10-CM

## 2020-11-26 DIAGNOSIS — E1169 Type 2 diabetes mellitus with other specified complication: Secondary | ICD-10-CM

## 2020-11-26 DIAGNOSIS — Z6841 Body Mass Index (BMI) 40.0 and over, adult: Secondary | ICD-10-CM

## 2020-11-26 LAB — ECHOCARDIOGRAM COMPLETE
AR max vel: 3.25 cm2
AV Area VTI: 3.33 cm2
AV Area mean vel: 3.14 cm2
AV Mean grad: 4 mmHg
AV Peak grad: 6.2 mmHg
Ao pk vel: 1.24 m/s
Area-P 1/2: 3.63 cm2
Calc EF: 59.2 %
Height: 67 in
S' Lateral: 3.8 cm
Single Plane A2C EF: 61 %
Single Plane A4C EF: 53.4 %
Weight: 3600 oz

## 2020-11-26 NOTE — Patient Instructions (Signed)
Medication Instructions:  Your physician recommends that you continue on your current medications as directed. Please refer to the Current Medication list given to you today.  *If you need a refill on your cardiac medications before your next appointment, please call your pharmacy*   Lab Work: None ordered If you have labs (blood work) drawn today and your tests are completely normal, you will receive your results only by: [facility] [place] (if you have MyChart) OR A paper copy in the mail If you have any lab test that is abnormal or we need to change your treatment, we will call you to review the results.   Testing/Procedures:  Echocardiogram performed today in office. Echocardiography is a painless test that uses sound waves to create images of your heart. It provides your doctor with information about the size and shape of your heart and how well your heart's chambers and valves are working. This procedure takes approximately one hour. There are no restrictions for this procedure.    Follow-Up: At [facility], you and your health needs are our priority.  As part of our continuing mission to provide you with exceptional heart care, we have created designated Provider Care Teams.  These Care Teams include your primary Cardiologist (physician) and Advanced Practice Providers (APPs -  Physician Assistants and Nurse Practitioners) who all work together to provide you with the care you need, when you need it.  We recommend signing up for the patient portal called "MyChart".  Sign up information is provided on this After Visit Summary.  MyChart is used to connect with patients for Virtual Visits (Telemedicine).  Patients are able to view lab/test results, encounter notes, upcoming appointments, etc.  Non-urgent messages can be sent to your provider as well.   To learn more about what you can do with MyChart, go to [email or web address].    Your next appointment:   6 month(s)  The format  for your next appointment:   In Person  Provider:   You may see Ida Rogue, MD or one of the following Advanced Practice Providers on your designated Care Team:   Murray Hodgkins, NP Christell Faith, PA-C Marrianne Mood, PA-C Cadence Kathlen Mody, [place]   Other Instructions

## 2020-11-27 ENCOUNTER — Telehealth: Payer: Self-pay | Admitting: Medical

## 2020-11-27 DIAGNOSIS — M174 Other bilateral secondary osteoarthritis of knee: Secondary | ICD-10-CM | POA: Diagnosis not present

## 2020-11-27 DIAGNOSIS — M25661 Stiffness of right knee, not elsewhere classified: Secondary | ICD-10-CM | POA: Diagnosis not present

## 2020-11-27 DIAGNOSIS — M25562 Pain in left knee: Secondary | ICD-10-CM | POA: Diagnosis not present

## 2020-11-27 DIAGNOSIS — R262 Difficulty in walking, not elsewhere classified: Secondary | ICD-10-CM | POA: Diagnosis not present

## 2020-11-27 DIAGNOSIS — M170 Bilateral primary osteoarthritis of knee: Secondary | ICD-10-CM | POA: Diagnosis not present

## 2020-11-27 DIAGNOSIS — M25662 Stiffness of left knee, not elsewhere classified: Secondary | ICD-10-CM | POA: Diagnosis not present

## 2020-11-27 DIAGNOSIS — M25561 Pain in right knee: Secondary | ICD-10-CM | POA: Diagnosis not present

## 2020-11-27 NOTE — Telephone Encounter (Signed)
Attempted to call the patient. No answer- no voice mail set up.   Will attempt to reach the patient at a later time.

## 2020-11-27 NOTE — Telephone Encounter (Signed)
Cadence Ninfa Meeker, PA-C  11/27/2020 10:33 AM EDT     Echo showed pump function low normal 50-55%, no wall motion abnormalities, impaired relaxation, normal valves. Overall reassuring

## 2020-11-28 NOTE — Telephone Encounter (Signed)
I have spoken with the patient regarding her echo results.  She voices understanding of these results and is agreeable.

## 2020-12-02 ENCOUNTER — Other Ambulatory Visit: Payer: Self-pay | Admitting: Cardiovascular Disease

## 2020-12-04 DIAGNOSIS — R262 Difficulty in walking, not elsewhere classified: Secondary | ICD-10-CM | POA: Diagnosis not present

## 2020-12-04 DIAGNOSIS — M25661 Stiffness of right knee, not elsewhere classified: Secondary | ICD-10-CM | POA: Diagnosis not present

## 2020-12-04 DIAGNOSIS — M174 Other bilateral secondary osteoarthritis of knee: Secondary | ICD-10-CM | POA: Diagnosis not present

## 2020-12-04 DIAGNOSIS — M25561 Pain in right knee: Secondary | ICD-10-CM | POA: Diagnosis not present

## 2020-12-04 DIAGNOSIS — M170 Bilateral primary osteoarthritis of knee: Secondary | ICD-10-CM | POA: Diagnosis not present

## 2020-12-04 DIAGNOSIS — M25562 Pain in left knee: Secondary | ICD-10-CM | POA: Diagnosis not present

## 2020-12-04 DIAGNOSIS — M25662 Stiffness of left knee, not elsewhere classified: Secondary | ICD-10-CM | POA: Diagnosis not present

## 2020-12-08 ENCOUNTER — Encounter: Payer: Self-pay | Admitting: Orthopedic Surgery

## 2020-12-08 ENCOUNTER — Other Ambulatory Visit: Payer: Self-pay | Admitting: Orthopedic Surgery

## 2020-12-08 ENCOUNTER — Other Ambulatory Visit: Payer: Self-pay

## 2020-12-08 ENCOUNTER — Encounter
Admission: RE | Admit: 2020-12-08 | Discharge: 2020-12-08 | Disposition: A | Discharge status: Home / Self Care | Payer: Medicare Other | Source: Ambulatory Visit | Attending: Orthopedic Surgery | Admitting: Orthopedic Surgery

## 2020-12-08 DIAGNOSIS — Z01818 Encounter for other preprocedural examination: Secondary | ICD-10-CM

## 2020-12-08 DIAGNOSIS — Z01812 Encounter for preprocedural laboratory examination: Secondary | ICD-10-CM | POA: Insufficient documentation

## 2020-12-08 LAB — BASIC METABOLIC PANEL
Anion gap: 5 (ref 5–15)
BUN: 18 mg/dL (ref 8–23)
CO2: 28 mmol/L (ref 22–32)
Calcium: 8.7 mg/dL — ABNORMAL LOW (ref 8.9–10.3)
Chloride: 105 mmol/L (ref 98–111)
Creatinine, Ser: 0.91 mg/dL (ref 0.44–1.00)
GFR, Estimated: >60 mL/min (ref >60)
Glucose, Bld: 124 mg/dL — ABNORMAL HIGH (ref 70–99)
Potassium: 4.6 mmol/L (ref 3.5–5.1)
Sodium: 138 mmol/L (ref 135–145)

## 2020-12-08 LAB — CBC WITH DIFFERENTIAL/PLATELET
Abs Immature Granulocytes: 0.02 10*3/uL (ref 0.00–0.07)
Basophils Absolute: 0.1 10*3/uL (ref 0.0–0.1)
Basophils Relative: 1 %
Eosinophils Absolute: 0.4 10*3/uL (ref 0.0–0.5)
Eosinophils Relative: 5 %
HCT: 35.4 % — ABNORMAL LOW (ref 36.0–46.0)
Hemoglobin: 11.6 g/dL — ABNORMAL LOW (ref 12.0–15.0)
Immature Granulocytes: 0 %
Lymphocytes Relative: 28 %
Lymphs Abs: 2.1 10*3/uL (ref 0.7–4.0)
MCH: 32.1 pg (ref 26.0–34.0)
MCHC: 32.8 g/dL (ref 30.0–36.0)
MCV: 98.1 fL (ref 80.0–100.0)
Monocytes Absolute: 0.6 10*3/uL (ref 0.1–1.0)
Monocytes Relative: 9 %
Neutro Abs: 4.2 10*3/uL (ref 1.7–7.7)
Neutrophils Relative %: 57 %
Platelets: 208 10*3/uL (ref 150–400)
RBC: 3.61 MIL/uL — ABNORMAL LOW (ref 3.87–5.11)
RDW: 11.3 % — ABNORMAL LOW (ref 11.5–15.5)
WBC: 7.3 10*3/uL (ref 4.0–10.5)
nRBC: 0 % (ref 0.0–0.2)

## 2020-12-08 LAB — URINALYSIS, COMPLETE (UACMP) WITH MICROSCOPIC
Bilirubin Urine: NEGATIVE
Glucose, UA: NEGATIVE mg/dL
Hgb urine dipstick: NEGATIVE
Ketones, ur: NEGATIVE mg/dL
Nitrite: POSITIVE — AB
Protein, ur: 30 mg/dL — AB
Specific Gravity, Urine: 1.025 (ref 1.005–1.030)
pH: 5 (ref 5.0–8.0)

## 2020-12-08 LAB — HEMOGLOBIN A1C
Hgb A1c MFr Bld: 7.3 % — ABNORMAL HIGH (ref 4.8–5.6)
Mean Plasma Glucose: 163 mg/dL

## 2020-12-08 LAB — TYPE AND SCREEN
ABO/RH(D): O POS
Antibody Screen: NEGATIVE

## 2020-12-08 LAB — PROTIME-INR
INR: 1.1 (ref 0.8–1.2)
Prothrombin Time: 14 seconds (ref 11.4–15.2)

## 2020-12-08 LAB — SURGICAL PCR SCREEN
MRSA, PCR: NEGATIVE
Staphylococcus aureus: POSITIVE — AB

## 2020-12-08 LAB — APTT: aPTT: 33 seconds (ref 24–36)

## 2020-12-08 NOTE — Patient Instructions (Addendum)
Your procedure is scheduled on: 12/18/20 Report to [facility]. To find out your arrival time please call [phone] between 1PM - 3PM on 12/17/20.  Remember: Instructions that are not followed completely may result in serious medical risk, up to and including death, or upon the discretion of your surgeon and anesthesiologist your surgery may need to be rescheduled.     _X__ 1. Do not eat food or liquids after midnight the night before your procedure.                 No gum chewing or hard candies.   __X__2.  On the morning of surgery brush your teeth with toothpaste and water, you                 may rinse your mouth with mouthwash if you wish.  Do not swallow any              toothpaste of mouthwash.     _X__ 3.  No Alcohol for 24 hours before or after surgery.   _X__ 4.  Do Not Smoke or use e-cigarettes For 24 Hours Prior to Your Surgery.                 Do not use any chewable tobacco products for at least 6 hours prior to                 surgery.  ____  5.  Bring all medications with you on the day of surgery if instructed.   __X__  6.  Notify your doctor if there is any change in your medical condition      (cold, fever, infections).     Do not wear jewelry, make-up, hairpins, clips or nail polish. Do not wear lotions, powders, or perfumes.  Do not shave body hair 48 hours prior to surgery. Men may shave face and neck. Do not bring valuables to the hospital.    [facility] is not responsible for any belongings or valuables.  Contacts, dentures/partials or body piercings may not be worn into surgery. Bring a case for your contacts, glasses or hearing aids, a denture cup will be supplied. Leave your suitcase in the car. After surgery it may be brought to your room. For patients admitted to the hospital, discharge time is determined by your treatment team.   Patients discharged the day of surgery will not be allowed to  drive home.   Please read over the following fact sheets that you were given:   MRSA Information, CHG Soap  __X__ Take these medicines the morning of surgery with A SIP OF WATER:    1. amLODipine (NORVASC) 2.5 MG tablet  2. carvedilol (COREG) 3.125 MG tablet  3. ezetimibe (ZETIA) 10 MG tablet  4. FLUoxetine (PROZAC) 10 MG capsule  5. levothyroxine (SYNTHROID) 125 MCG tablet  6. rosuvastatin (CRESTOR) 20 MG tablet  ____ Fleet Enema (as directed)   __X__ Use CHG Soap/SAGE wipes as directed  ____ Use inhalers on the day of surgery  __X__ Stop metformin/Janumet/Farxiga 2 days prior to surgery (DAY OF SURGERY COUNTS AS 1 DAY)   ____ Take 1/2 of usual insulin dose the night before surgery. No insulin the morning          of surgery.   ____ Stop Blood Thinners Coumadin/Plavix/Xarelto/Pleta/Pradaxa/Eliquis/Effient/Aspirin  on   Or contact your Surgeon, Cardiologist or Medical Doctor regarding  ability to stop your blood thinners  __X__ Stop Anti-inflammatories 7 days before surgery such as Advil, Ibuprofen, Motrin,  BC or Goodies Powder, Naprosyn, Naproxen, Aleve,  STOP ASPIRIN 5 DAYS PRIOR TO SURGERY AS INSTRUCTED BY CARDIOLOGY  __X__ Stop all herbal supplements, fish oil or vitamin E until after surgery.    __X__ Bring C-Pap to the hospital.

## 2020-12-09 DIAGNOSIS — M170 Bilateral primary osteoarthritis of knee: Secondary | ICD-10-CM | POA: Diagnosis not present

## 2020-12-09 DIAGNOSIS — R262 Difficulty in walking, not elsewhere classified: Secondary | ICD-10-CM | POA: Diagnosis not present

## 2020-12-09 DIAGNOSIS — M25562 Pain in left knee: Secondary | ICD-10-CM | POA: Diagnosis not present

## 2020-12-09 DIAGNOSIS — M174 Other bilateral secondary osteoarthritis of knee: Secondary | ICD-10-CM | POA: Diagnosis not present

## 2020-12-09 DIAGNOSIS — M25662 Stiffness of left knee, not elsewhere classified: Secondary | ICD-10-CM | POA: Diagnosis not present

## 2020-12-09 DIAGNOSIS — M25561 Pain in right knee: Secondary | ICD-10-CM | POA: Diagnosis not present

## 2020-12-09 DIAGNOSIS — M25661 Stiffness of right knee, not elsewhere classified: Secondary | ICD-10-CM | POA: Diagnosis not present

## 2020-12-10 ENCOUNTER — Telehealth: Payer: Self-pay | Admitting: Urgent Care

## 2020-12-10 DIAGNOSIS — Z01818 Encounter for other preprocedural examination: Secondary | ICD-10-CM

## 2020-12-10 DIAGNOSIS — N390 Urinary tract infection, site not specified: Secondary | ICD-10-CM

## 2020-12-10 LAB — URINE CULTURE: Culture: >=100000 — AB

## 2020-12-10 MED ORDER — SULFAMETHOXAZOLE-TRIMETHOPRIM 800-160 MG PO TABS: 1.0000 | ORAL_TABLET | Freq: Two times a day (BID) | ORAL | 0 refills | Status: AC

## 2020-12-10 NOTE — Progress Notes (Signed)
  [facility] Perioperative Services: Pre-Admission/Anesthesia Testing  Abnormal Lab Notification and Treatment Plan of Care   Date: 12/10/20  Name: [name] MRN:   [id number]  Re: Abnormal labs noted during PAT appointment   Provider(s) Notified: [place], MD Notification mode: Routed and/or faxed via [facility]   ABNORMAL LAB VALUE(S): Lab Results  Component Value Date   COLORURINE YELLOW (A) 12/08/2020   APPEARANCEUR HAZY (A) 12/08/2020   LABSPEC 1.025 12/08/2020   PHURINE 5.0 12/08/2020   GLUCOSEU NEGATIVE 12/08/2020   HGBUR NEGATIVE 12/08/2020   BILIRUBINUR NEGATIVE 12/08/2020   KETONESUR NEGATIVE 12/08/2020   PROTEINUR 30 (A) 12/08/2020   NITRITE POSITIVE (A) 12/08/2020   LEUKOCYTESUR LARGE (A) 12/08/2020   EPIU 0-5 12/08/2020   WBCU 21-50 12/08/2020   RBCU 0-5 12/08/2020   BACTERIA MANY (A) 12/08/2020   Lab Results  Component Value Date   CULT >=100,000 COLONIES/mL ESCHERICHIA COLI (A) 12/08/2020    Clinical Notes:  Patient scheduled for an elective RIGHT TOTAL KNEE ARTHROPLASTY on 12/18/2020.   UA performed in PAT consistent with infection.  No leukocytosis noted on CBC; WBC 7300 Renal function: Estimated Creatinine Clearance: 64.3 mL/min (by C-G formula based on SCr of 0.91 mg/dL). Urine C&S added to assess for pathogenically significant growth.  Impression and Plan:  UA was (+) for infection. Reflex culture sent by PAT APP with (+) significant Escherichia coli  resulting. Contacted patient to discuss. Patient reporting that urine is hazy and she seems to be voiding more. No dysuria, fevers, nausea, or back/abdominal pain. Patient with elective orthopedic surgery scheduled soon. In efforts to avoid delaying patient's procedure, or having her experience any potential complications associated with findings on UA, I would like to proceed with empiric treatment for presumed early urinary tract infection.  Allergies and sensitivity  report reviewed. Will treat with a 3 day course of SMZ-TMP DS. Patient encouraged to complete the entire course of antibiotics even if she begins to feel better.   Meds ordered this encounter  Medications   sulfamethoxazole-trimethoprim (BACTRIM DS) 800-160 MG tablet    Sig: Take 1 tablet by mouth 2 (two) times daily for 3 days. Increase WATER intake while on this medication.    Dispense:  6 tablet    Refill:  0   Patient encouraged to increase her fluid intake as much as possible. Discussed that water is always best to flush the urinary tract. She was advised to avoid caffeine containing fluids until her infections clears, as caffeine can cause her to experience painful bladder spasms.   May use Tylenol as needed for pain/fever.   Results and treatment plan of care forwarded to primary attending surgeon to make them aware.   This is a Community education officer; no formal response is required.  Honor Loh, MSN, APRN, FNP-C, CEN [facility]  Peri-operative Services Nurse Practitioner Phone: [phone] Fax: [phone] 12/10/20 9:18 AM

## 2020-12-11 DIAGNOSIS — R262 Difficulty in walking, not elsewhere classified: Secondary | ICD-10-CM | POA: Diagnosis not present

## 2020-12-11 DIAGNOSIS — M174 Other bilateral secondary osteoarthritis of knee: Secondary | ICD-10-CM | POA: Diagnosis not present

## 2020-12-11 DIAGNOSIS — M25562 Pain in left knee: Secondary | ICD-10-CM | POA: Diagnosis not present

## 2020-12-11 DIAGNOSIS — M170 Bilateral primary osteoarthritis of knee: Secondary | ICD-10-CM | POA: Diagnosis not present

## 2020-12-11 DIAGNOSIS — M25561 Pain in right knee: Secondary | ICD-10-CM | POA: Diagnosis not present

## 2020-12-11 DIAGNOSIS — M25662 Stiffness of left knee, not elsewhere classified: Secondary | ICD-10-CM | POA: Diagnosis not present

## 2020-12-11 DIAGNOSIS — M25661 Stiffness of right knee, not elsewhere classified: Secondary | ICD-10-CM | POA: Diagnosis not present

## 2020-12-15 DIAGNOSIS — M25562 Pain in left knee: Secondary | ICD-10-CM | POA: Diagnosis not present

## 2020-12-15 DIAGNOSIS — M25662 Stiffness of left knee, not elsewhere classified: Secondary | ICD-10-CM | POA: Diagnosis not present

## 2020-12-15 DIAGNOSIS — M25561 Pain in right knee: Secondary | ICD-10-CM | POA: Diagnosis not present

## 2020-12-15 DIAGNOSIS — R262 Difficulty in walking, not elsewhere classified: Secondary | ICD-10-CM | POA: Diagnosis not present

## 2020-12-15 DIAGNOSIS — M170 Bilateral primary osteoarthritis of knee: Secondary | ICD-10-CM | POA: Diagnosis not present

## 2020-12-15 DIAGNOSIS — M25661 Stiffness of right knee, not elsewhere classified: Secondary | ICD-10-CM | POA: Diagnosis not present

## 2020-12-15 DIAGNOSIS — M174 Other bilateral secondary osteoarthritis of knee: Secondary | ICD-10-CM | POA: Diagnosis not present

## 2020-12-15 NOTE — Progress Notes (Signed)
Perioperative Services  Pre-Admission/Anesthesia Testing Clinical Review  Date: 12/15/20  Patient Demographics:  Name: [name] DOB:   March 20, 1945 MRN:   [id number]  Planned Surgical Procedure(s):    Case: [id number] Date/Time: 12/18/20 0730   Procedure: RIGHT TOTAL KNEE ARTHROPLASTY (Right: Knee)   Anesthesia type: Choice   Pre-op diagnosis: Right Knee Osteoarthritis   Location: [facility] OR ROOM 02 / [place] [facility] FOR ANESTHESIA GROUP   Surgeons: Thornton Park, MD     NOTE: Available PAT nursing documentation and vital signs have been reviewed. Clinical nursing staff has updated patient's PMH/PSHx, current medication list, and drug allergies/intolerances to ensure comprehensive history available to assist in medical decision making as it pertains to the aforementioned surgical procedure and anticipated anesthetic course. Extensive review of available clinical information performed. [place] PMH and PSHx updated with any diagnoses/procedures that  may have been inadvertently omitted during her intake with the pre-admission testing department's nursing staff.  Clinical Discussion:  [name] is a 75 y.o. female who is submitted for pre-surgical anesthesia review and clearance prior to her undergoing the above procedure. Patient has never been a smoker. Pertinent PMH includes: CAD, STEMI, ischemic cardiomyopathy, diastolic dysfunction, HTN, HLD, T2DM, thyroid cancer (s/p thyroidectomy), OSAH (requires nocturnal PAP therapy), OA, depression.  Patient is followed by cardiology Rockey Situ, MD). She was last seen in the cardiology clinic on 11/26/2020; notes reviewed.  At the time of her clinic visit, patient doing well overall from a cardiovascular perspective.  She denied any episodes of chest pain, significant shortness of breath, PND, orthopnea, palpitations, vertiginous symptoms, or presyncope/syncope.  Patient with intermittent edema in her LEFT lower extremity.  PMH significant for  cardiovascular diagnoses.  Patient suffered an anterolateral wall STEMI on 08/14/2013 while living in [place].  She underwent diagnostic left heart catheterization that revealed 100% occlusion of the LAD.  Subsequently, thrombectomy and PCI was performed placing a 3.5 x 23 mm Xience Alpine DES x1 resulting in 0% residual stenosis and TIMI-3 flow.  Following her MI, patient has undergone serial monitoring of her cardiac function via echocardiogram.  Last TTE was performed on 11/26/2020 revealing a low normal left ventricular systolic function with an EF of 50-55%.  Diastolic Doppler parameters consistent with pseudonormalization (G2DD).  There was no evidence of significant valvular regurgitation or stenosis.  Blood pressure well controlled 124/72 on currently prescribed CCB, beta-blocker, and ARB therapies.  Patient is on a statin + ezetimibe for her HLD.  T2DM fairly well controlled on currently prescribed regimen; last Hgb A1c was 7.3% when checked on 12/08/2020.  Patient has an OSAH diagnosis and reported that she was compliant with prescribed nocturnal PAP therapy.  ECG performed in the office revealed sinus bradycardia at a rate of 58 bpm's.  Cardiology provider noted TWI and aVL. Functional capacity, as defined by DASI, is documented as being >/= 4 METS.  No changes were made to her medication regimen.  Patient follow-up with outpatient cardiology in 6 months or sooner if needed.  [name] is scheduled for an elective RIGHT TOTAL KNEE ARTHROPLASTY on 12/18/2020 with Dr. Thornton Park, MD. Given patient's past medical history significant for cardiovascular diagnoses, presurgical cardiac clearance was sought by the performing surgeon's office and PAT team.  Per cardiology, "RCRI class III, which is a 10.1% risk for 30-day risk of MACE. Based ACC/AHA guidelines, the patient's past medical history, and the amount of time since her last clinic visit, this patient would be at an overall  ACCEPTABLE  risk for the planned procedure without further cardiovascular testing or intervention at this time".  This patient is on daily antiplatelet therapy.  She has been instructed on recommendations for holding her daily low-dose ASA for 5 days prior to surgery with plans to restart as soon as postoperative bleeding risk felt to be minimized by primary attending surgeon.  Patient is aware that her last dose of ASA will be on 12/12/2020.  Patient denies previous perioperative complications with anesthesia in the past. In review of the available records, it is noted that patient underwent a MAC anesthetic course here (ASA III) in 09/2016 without documented complications.   Vitals with BMI 12/08/2020 11/26/2020 07/01/2020  Height _0  _1  -  Weight 224 lbs 225 lbs -  BMI 28.78 67.67 -  Systolic 209 470 962  Diastolic 58 72 80  Pulse 64 58 -    Providers/Specialists:   NOTE: Primary physician provider listed below. Patient may have been seen by APP or partner within same practice.   PROVIDER ROLE / SPECIALTY LAST Larey Seat, MD ORTHOPEDICS (SURGEON) 11/14/2020  Olin Hauser, DO PRIMARY CARE PROVIDER 07/01/2020  Ida Rogue, MD CARDIOLOGY 11/26/2020   Allergies:  Patient has no known allergies.  Current Home Medications:   No current facility-administered medications for this encounter.    acetaminophen (TYLENOL) 500 MG tablet   amLODipine (NORVASC) 2.5 MG tablet   aspirin 81 MG tablet   carvedilol (COREG) 3.125 MG tablet   ezetimibe (ZETIA) 10 MG tablet   FLUoxetine (PROZAC) 10 MG capsule   levothyroxine (SYNTHROID) 125 MCG tablet   metFORMIN (GLUCOPHAGE-XR) 500 MG 24 hr tablet   nitroGLYCERIN (NITROSTAT) 0.4 MG SL tablet   OZEMPIC, 0.25 OR 0.5 MG/DOSE, 2 MG/1.5ML SOPN   rosuvastatin (CRESTOR) 20 MG tablet   ACCU-CHEK AVIVA PLUS test strip   losartan (COZAAR) 100 MG tablet   History:   Past Medical History:  Diagnosis Date   Coronary artery  disease    a. 07/2013 STEMI/PCI ([place]): LM nl, LAD 100p (thrombectomy & 3.5x23 Alpine DES), LCX nl, RCA nl, EF 45%.   Depression    Diastolic dysfunction    a.) TTE 11/26/2020 - LVEF 50-55%; G2DD.   Endometrial cancer ([place])    Gravida 3 para 3    Hyperlipidemia    Hypertension    Hypertensive heart disease    Hypothyroid    Ischemic cardiomyopathy    a.) 07/2013 EF 45% @ time of STEMI; b.) 11/2013 Echo: EF 55-60%, mildly dil LA, nl RV fxn. c.) TTE 07/29/2015 - LVEF 55-60%, mild LA and RV dilation. d.) TTE 11/26/2020 - LVEF 50-55%; G2DD.   Menopause    Obstructive sleep apnea on CPAP    Osteoarthritis    ST elevation myocardial infarction (STEMI) of anterolateral wall ([place]) 08/14/2013   a.) LVEF reduced at 45%; Tx'd with trombectomy and PCI to 100% LAD; 3.5 x 23 mm Xience Alpine DES placed.   T2DM (type 2 diabetes mellitus) ([place])    Thyroid cancer ([place])    a.) s/p total thyroidectomy. b.) on daily levothyroxine.   Past Surgical History:  Procedure Laterality Date   ABDOMINAL HYSTERECTOMY     CATARACT EXTRACTION W/PHACO Left 08/30/2016   Procedure: CATARACT EXTRACTION PHACO AND INTRAOCULAR LENS PLACEMENT ([place]) Left daibetic;  Surgeon: Leandrew Koyanagi, MD;  Location: [place];  Service: Ophthalmology;  Laterality: Left;  Diabetic - insulin and oral meds sleep apnea   CATARACT EXTRACTION W/PHACO Right 10/07/2016  Procedure: CATARACT EXTRACTION PHACO AND INTRAOCULAR LENS PLACEMENT ([facility]);  Surgeon: Leandrew Koyanagi, MD;  Location: [facility] ORS;  Service: Ophthalmology;  Laterality: Right;  Lot# [id number] H [place]: 00:43.3 AP%: 12.2 CDE: 5.28   CORONARY ANGIOPLASTY WITH STENT PLACEMENT Left 08/14/2013   Procedure: CORONARY ANGIOPLASTY WITH STENT PLACEMENT (3.5 x 23 mm Xience Alpine DES to LAD); Location: [facility] [place]; Surgeon: Algernon Huxley, MD   GANGLION CYST EXCISION     LAPAROSCOPIC HYSTERECTOMY     THYROIDECTOMY     Family History  Problem Relation Age  of Onset   Heart attack Father    Heart failure Father    Hypertension Father    Hyperlipidemia Father    Heart disease Father 35       CABG    Pulmonary fibrosis Son    Breast cancer Neg Hx    Social History   Tobacco Use   Smoking status: Never   Smokeless tobacco: Never  Vaping Use   Vaping Use: Never used  Substance Use Topics   Alcohol use: No   Drug use: No    Pertinent Clinical Results:  LABS: Labs reviewed: Acceptable for surgery.  No visits with results within 3 Day(s) from this visit.  Latest known visit with results is:  Hospital Outpatient Visit on 12/08/2020  Component Date Value Ref Range Status   WBC 12/08/2020 7.3  4.0 - 10.5 K/uL Final   RBC 12/08/2020 3.61 (A) 3.87 - 5.11 MIL/uL Final   Hemoglobin 12/08/2020 11.6 (A) 12.0 - 15.0 g/dL Final   HCT 12/08/2020 35.4 (A) 36.0 - 46.0 % Final   MCV 12/08/2020 98.1  80.0 - 100.0 fL Final   MCH 12/08/2020 32.1  26.0 - 34.0 pg Final   MCHC 12/08/2020 32.8  30.0 - 36.0 g/dL Final   RDW 12/08/2020 11.3 (A) 11.5 - 15.5 % Final   Platelets 12/08/2020 208  150 - 400 K/uL Final   nRBC 12/08/2020 0.0  0.0 - 0.2 % Final   Neutrophils Relative % 12/08/2020 57  % Final   Neutro Abs 12/08/2020 4.2  1.7 - 7.7 K/uL Final   Lymphocytes Relative 12/08/2020 28  % Final   Lymphs Abs 12/08/2020 2.1  0.7 - 4.0 K/uL Final   Monocytes Relative 12/08/2020 9  % Final   Monocytes Absolute 12/08/2020 0.6  0.1 - 1.0 K/uL Final   Eosinophils Relative 12/08/2020 5  % Final   Eosinophils Absolute 12/08/2020 0.4  0.0 - 0.5 K/uL Final   Basophils Relative 12/08/2020 1  % Final   Basophils Absolute 12/08/2020 0.1  0.0 - 0.1 K/uL Final   Immature Granulocytes 12/08/2020 0  % Final   Abs Immature Granulocytes 12/08/2020 0.02  0.00 - 0.07 K/uL Final   Performed at [facility], [place], [place] [address]   Sodium 12/08/2020 138  135 - 145 mmol/L Final   Potassium 12/08/2020 4.6  3.5 - 5.1 mmol/L Final   Chloride  12/08/2020 105  98 - 111 mmol/L Final   CO2 12/08/2020 28  22 - 32 mmol/L Final   Glucose, Bld 12/08/2020 124 (A) 70 - 99 mg/dL Final   Glucose reference range applies only to samples taken after fasting for at least 8 hours.   BUN 12/08/2020 18  8 - 23 mg/dL Final   Creatinine, Ser 12/08/2020 0.91  0.44 - 1.00 mg/dL Final   Calcium 12/08/2020 8.7 (A) 8.9 - 10.3 mg/dL Final   GFR, Estimated 12/08/2020 >60  >60  mL/min Final   Comment: (NOTE) Calculated using the CKD-EPI Creatinine Equation (2021)    Anion gap 12/08/2020 5  5 - 15 Final   Performed at [facility], [address] [place], [place] [address]   Prothrombin Time 12/08/2020 14.0  11.4 - 15.2 seconds Final   INR 12/08/2020 1.1  0.8 - 1.2 Final   Comment: (NOTE) INR goal varies based on device and disease states. Performed at [facility], [address] [place], [place] [address]    aPTT 12/08/2020 33  24 - 36 seconds Final   Performed at [facility], [place], [place] [address]   Hgb A1c MFr Bld 12/08/2020 7.3 (A) 4.8 - 5.6 % Final   Comment: (NOTE)         Prediabetes: 5.7 - 6.4         Diabetes: >6.4         Glycemic control for adults with diabetes: <7.0    Mean Plasma Glucose 12/08/2020 163  mg/dL Final   Comment: (NOTE) Performed At: [facility] [place], [place] [id number] Rush Farmer MD TK:[id number]    Color, Urine 12/08/2020 YELLOW (A) YELLOW Final   APPearance 12/08/2020 HAZY (A) CLEAR Final   Specific Gravity, Urine 12/08/2020 1.025  1.005 - 1.030 Final   pH 12/08/2020 5.0  5.0 - 8.0 Final   Glucose, UA 12/08/2020 NEGATIVE  NEGATIVE mg/dL Final   Hgb urine dipstick 12/08/2020 NEGATIVE  NEGATIVE Final   Bilirubin Urine 12/08/2020 NEGATIVE  NEGATIVE Final   Ketones, ur 12/08/2020 NEGATIVE  NEGATIVE mg/dL Final   Protein, ur 12/08/2020 30 (A) NEGATIVE mg/dL Final   Nitrite 12/08/2020 POSITIVE (A) NEGATIVE Final   Leukocytes,Ua  12/08/2020 LARGE (A) NEGATIVE Final   RBC / HPF 12/08/2020 0-5  0 - 5 RBC/hpf Final   WBC, UA 12/08/2020 21-50  0 - 5 WBC/hpf Final   Bacteria, UA 12/08/2020 MANY (A) NONE SEEN Final   Squamous Epithelial / LPF 12/08/2020 0-5  0 - 5 Final   Mucus 12/08/2020 PRESENT   Final   Performed at [facility], [address] [place], [place] [address]   ABO/RH(D) 12/08/2020 O POS   Final   Antibody Screen 12/08/2020 NEG   Final   Sample Expiration 12/08/2020 12/22/2020,2359   Final   Extend sample reason 12/08/2020    Final                   Value:NO TRANSFUSIONS OR PREGNANCY IN THE PAST 3 MONTHS Performed at [facility], [address] [place], [place] [address]    MRSA, PCR 12/08/2020 NEGATIVE  NEGATIVE Final   Staphylococcus aureus 12/08/2020 POSITIVE (A) NEGATIVE Final   Comment: (NOTE) The Xpert SA Assay (FDA approved for NASAL specimens in patients 57 years of age and older), is one component of a comprehensive surveillance program. It is not intended to diagnose infection nor to guide or monitor treatment. Performed at [facility], [address] [place], [place] [address]     ECG: Date: 11/26/2020 Time ECG obtained: 1104 AM Rate: 58 bpm Rhythm: sinus bradycardia Axis (leads I and aVF): Normal Intervals: PR 168 ms. QRS 84 ms. QTc 418 ms. ST segment and T wave changes: No evidence of acute ST segment elevation or depression. TWI in aVL.  Comparison: Similar to previous tracing obtained on 01/29/2020   IMAGING / PROCEDURES: TRANSTHORACIC ECHOCARDIOGRAM performed on 11/26/2020 Left ventricular ejection fraction, by estimation, is  50 to 55%. The left ventricle has low normal function. The left ventricle has no regional  wall motion abnormalities. Left ventricular diastolic parameters are consistent with Grade II diastolic dysfunction (pseudonormalization).  Right ventricular systolic function is normal. The right ventricular size is normal.  The  mitral valve is normal in structure. No evidence of mitral valve regurgitation.  The aortic valve is grossly normal. Aortic valve regurgitation is not visualized.    Impression and Plan:  [name] has been referred for pre-anesthesia review and clearance prior to her undergoing the planned anesthetic and procedural courses. Available labs, pertinent testing, and imaging results were personally reviewed by me. This patient has been appropriately cleared by cardiology with an overall ACCEPTABLE risk of significant perioperative cardiovascular complications.  Patient with noted UTI in PAT. Given upcoming surgery, she was treated with a culture appropriate 3 day course of SMZ-TMP DS by PAT APP. Additionally, patient scheduled to receive both IV cefazolin and clindamycin preoperative, both of which will provide additional coverage should any residual bacteruria persist. Primary attending surgeon aware of treatment POC for this patient.   Based on clinical review performed today (12/15/20), barring any significant acute changes in the patient's overall condition, it is anticipated that she will be able to proceed with the planned surgical intervention. Any acute changes in clinical condition may necessitate her procedure being postponed and/or cancelled. Patient will meet with anesthesia team (MD and/or CRNA) on the day of her procedure for preoperative evaluation/assessment. Questions regarding anesthetic course will be fielded at that time.   Pre-surgical instructions were reviewed with the patient during her PAT appointment and questions were fielded by PAT clinical staff. Patient was advised that if any questions or concerns arise prior to her procedure then she should return a call to PAT and/or her surgeon's office to discuss.  Honor Loh, MSN, APRN, FNP-C, CEN [facility]  Peri-operative Services Nurse Practitioner Phone: [phone] Fax: [phone] 12/15/20 9:13  AM  NOTE: This note has been prepared using Dragon dictation software. Despite my best ability to proofread, there is always the potential that unintentional transcriptional errors may still occur from this process.

## 2020-12-16 ENCOUNTER — Other Ambulatory Visit: Payer: Self-pay

## 2020-12-16 ENCOUNTER — Other Ambulatory Visit
Admission: RE | Admit: 2020-12-16 | Discharge: 2020-12-16 | Disposition: A | Discharge status: Home / Self Care | Payer: Medicare Other | Source: Ambulatory Visit | Attending: Orthopedic Surgery | Admitting: Orthopedic Surgery

## 2020-12-16 DIAGNOSIS — Z01812 Encounter for preprocedural laboratory examination: Secondary | ICD-10-CM | POA: Diagnosis not present

## 2020-12-16 DIAGNOSIS — Z20822 Contact with and (suspected) exposure to covid-19: Secondary | ICD-10-CM | POA: Diagnosis not present

## 2020-12-16 LAB — SARS CORONAVIRUS 2 (TAT 6-24 HRS): SARS Coronavirus 2: NEGATIVE

## 2020-12-18 ENCOUNTER — Ambulatory Visit: Payer: Medicare Other | Admitting: Urgent Care

## 2020-12-18 ENCOUNTER — Encounter
Admission: RE | Disposition: A | Discharge status: Home / Self Care | Payer: Self-pay | Source: Home / Self Care | Attending: Orthopedic Surgery

## 2020-12-18 ENCOUNTER — Other Ambulatory Visit: Payer: Self-pay

## 2020-12-18 ENCOUNTER — Encounter: Payer: Self-pay | Admitting: Orthopedic Surgery

## 2020-12-18 ENCOUNTER — Observation Stay
Admission: RE | Admit: 2020-12-18 | Discharge: 2020-12-19 | Disposition: A | Discharge status: Home / Self Care | Payer: Medicare Other | Attending: Orthopedic Surgery | Admitting: Orthopedic Surgery

## 2020-12-18 ENCOUNTER — Observation Stay: Payer: Medicare Other

## 2020-12-18 DIAGNOSIS — Z7984 Long term (current) use of oral hypoglycemic drugs: Secondary | ICD-10-CM | POA: Diagnosis not present

## 2020-12-18 DIAGNOSIS — M1711 Unilateral primary osteoarthritis, right knee: Secondary | ICD-10-CM | POA: Diagnosis not present

## 2020-12-18 DIAGNOSIS — Z85850 Personal history of malignant neoplasm of thyroid: Secondary | ICD-10-CM | POA: Diagnosis not present

## 2020-12-18 DIAGNOSIS — Z79899 Other long term (current) drug therapy: Secondary | ICD-10-CM | POA: Diagnosis not present

## 2020-12-18 DIAGNOSIS — E119 Type 2 diabetes mellitus without complications: Secondary | ICD-10-CM | POA: Insufficient documentation

## 2020-12-18 DIAGNOSIS — Z955 Presence of coronary angioplasty implant and graft: Secondary | ICD-10-CM | POA: Insufficient documentation

## 2020-12-18 DIAGNOSIS — E039 Hypothyroidism, unspecified: Secondary | ICD-10-CM | POA: Diagnosis not present

## 2020-12-18 DIAGNOSIS — Z96651 Presence of right artificial knee joint: Secondary | ICD-10-CM

## 2020-12-18 DIAGNOSIS — Z7982 Long term (current) use of aspirin: Secondary | ICD-10-CM | POA: Diagnosis not present

## 2020-12-18 DIAGNOSIS — I10 Essential (primary) hypertension: Secondary | ICD-10-CM | POA: Insufficient documentation

## 2020-12-18 DIAGNOSIS — I2510 Atherosclerotic heart disease of native coronary artery without angina pectoris: Secondary | ICD-10-CM | POA: Diagnosis not present

## 2020-12-18 DIAGNOSIS — Z23 Encounter for immunization: Secondary | ICD-10-CM | POA: Insufficient documentation

## 2020-12-18 DIAGNOSIS — Z471 Aftercare following joint replacement surgery: Secondary | ICD-10-CM | POA: Diagnosis not present

## 2020-12-18 HISTORY — PX: TOTAL KNEE ARTHROPLASTY: SHX125

## 2020-12-18 HISTORY — DX: Type 2 diabetes mellitus without complications: E11.9

## 2020-12-18 HISTORY — DX: Malignant neoplasm of thyroid gland: C73

## 2020-12-18 HISTORY — DX: Other ill-defined heart diseases: I51.89

## 2020-12-18 LAB — ABO/RH: ABO/RH(D): O POS

## 2020-12-18 LAB — GLUCOSE, CAPILLARY
Glucose-Capillary: 141 mg/dL — ABNORMAL HIGH (ref 70–99)
Glucose-Capillary: 140 mg/dL — ABNORMAL HIGH (ref 70–99)
Glucose-Capillary: 186 mg/dL — ABNORMAL HIGH (ref 70–99)

## 2020-12-18 SURGERY — ARTHROPLASTY, KNEE, TOTAL
Anesthesia: Spinal | Site: Knee | Laterality: Right

## 2020-12-18 MED ORDER — DEXMEDETOMIDINE (PRECEDEX) IN NS 20 MCG/5ML (4 MCG/ML) IV SYRINGE
PREFILLED_SYRINGE | INTRAVENOUS | Status: DC | PRN
  Administered 2020-12-18: 12 ug via INTRAVENOUS
  Administered 2020-12-18: 8 ug via INTRAVENOUS

## 2020-12-18 MED ORDER — BUPIVACAINE LIPOSOME 1.3 % IJ SUSP
INTRAMUSCULAR | Status: AC
  Filled 2020-12-18: qty 20

## 2020-12-18 MED ORDER — INFLUENZA VAC A&B SA ADJ QUAD 0.5 ML IM PRSY
0.5000 mL | PREFILLED_SYRINGE | INTRAMUSCULAR | Status: AC
  Administered 2020-12-19: 0.5 mL via INTRAMUSCULAR
  Filled 2020-12-18: qty 0.5

## 2020-12-18 MED ORDER — ROSUVASTATIN CALCIUM 10 MG PO TABS
20.0000 mg | ORAL_TABLET | Freq: Every day | ORAL | Status: DC
  Administered 2020-12-19: 20 mg via ORAL
  Filled 2020-12-18: qty 2

## 2020-12-18 MED ORDER — FENTANYL CITRATE (PF) 100 MCG/2ML IJ SOLN
INTRAMUSCULAR | Status: DC | PRN
  Administered 2020-12-18 (×2): 50 ug via INTRAVENOUS

## 2020-12-18 MED ORDER — SODIUM CHLORIDE (PF) 0.9 % IJ SOLN
INTRAMUSCULAR | Status: DC | PRN
  Administered 2020-12-18: 121 mL via INTRA_ARTICULAR

## 2020-12-18 MED ORDER — PROPOFOL 1000 MG/100ML IV EMUL
INTRAVENOUS | Status: AC
  Filled 2020-12-18: qty 100

## 2020-12-18 MED ORDER — ENOXAPARIN SODIUM 40 MG/0.4ML IJ SOSY
40.0000 mg | PREFILLED_SYRINGE | INTRAMUSCULAR | Status: DC
  Administered 2020-12-19: 40 mg via SUBCUTANEOUS
  Filled 2020-12-18: qty 0.4

## 2020-12-18 MED ORDER — SODIUM CHLORIDE 0.9 % IV SOLN: INTRAVENOUS | Status: DC

## 2020-12-18 MED ORDER — ACETAMINOPHEN 500 MG PO TABS: 1000.0000 mg | ORAL_TABLET | ORAL | Status: AC

## 2020-12-18 MED ORDER — EZETIMIBE 10 MG PO TABS
10.0000 mg | ORAL_TABLET | Freq: Every day | ORAL | Status: DC
  Administered 2020-12-19: 10 mg via ORAL
  Filled 2020-12-18 (×2): qty 1

## 2020-12-18 MED ORDER — CHLORHEXIDINE GLUCONATE CLOTH 2 % EX PADS: 6.0000 | MEDICATED_PAD | Freq: Once | CUTANEOUS | Status: DC

## 2020-12-18 MED ORDER — ONDANSETRON HCL 4 MG/2ML IJ SOLN
4.0000 mg | Freq: Four times a day (QID) | INTRAMUSCULAR | Status: DC | PRN
  Administered 2020-12-18: 4 mg via INTRAVENOUS
  Filled 2020-12-18: qty 2

## 2020-12-18 MED ORDER — PHENYLEPHRINE HCL (PRESSORS) 10 MG/ML IV SOLN
INTRAVENOUS | Status: DC | PRN
  Administered 2020-12-18: 160 ug via INTRAVENOUS
  Administered 2020-12-18: 80 ug via INTRAVENOUS
  Administered 2020-12-18: 160 ug via INTRAVENOUS
  Administered 2020-12-18 (×2): 80 ug via INTRAVENOUS

## 2020-12-18 MED ORDER — FAMOTIDINE 20 MG PO TABS
ORAL_TABLET | ORAL | Status: AC
  Administered 2020-12-18: 20 mg via ORAL
  Filled 2020-12-18: qty 1

## 2020-12-18 MED ORDER — CARVEDILOL 3.125 MG PO TABS
3.1250 mg | ORAL_TABLET | Freq: Every morning | ORAL | Status: DC
  Administered 2020-12-19: 3.125 mg via ORAL
  Filled 2020-12-18: qty 1

## 2020-12-18 MED ORDER — CEFAZOLIN SODIUM-DEXTROSE 2-4 GM/100ML-% IV SOLN
INTRAVENOUS | Status: AC
  Administered 2020-12-18: 2 g via INTRAVENOUS
  Filled 2020-12-18: qty 100

## 2020-12-18 MED ORDER — TRANEXAMIC ACID-NACL 1000-0.7 MG/100ML-% IV SOLN
INTRAVENOUS | Status: AC
  Filled 2020-12-18: qty 100

## 2020-12-18 MED ORDER — GLYCOPYRROLATE 0.2 MG/ML IJ SOLN
INTRAMUSCULAR | Status: AC
  Filled 2020-12-18: qty 1

## 2020-12-18 MED ORDER — FENTANYL CITRATE (PF) 100 MCG/2ML IJ SOLN
INTRAMUSCULAR | Status: AC
  Filled 2020-12-18: qty 2

## 2020-12-18 MED ORDER — SENNOSIDES-DOCUSATE SODIUM 8.6-50 MG PO TABS: 1.0000 | ORAL_TABLET | Freq: Every evening | ORAL | Status: DC | PRN

## 2020-12-18 MED ORDER — CEFAZOLIN SODIUM-DEXTROSE 2-4 GM/100ML-% IV SOLN
INTRAVENOUS | Status: AC
  Filled 2020-12-18: qty 100

## 2020-12-18 MED ORDER — BUPIVACAINE HCL (PF) 0.5 % IJ SOLN
INTRAMUSCULAR | Status: DC | PRN
  Administered 2020-12-18: 2.8 mL via INTRATHECAL

## 2020-12-18 MED ORDER — STERILE WATER FOR IRRIGATION IR SOLN
Status: DC | PRN
  Administered 2020-12-18: 1000 mL

## 2020-12-18 MED ORDER — TRAMADOL HCL 50 MG PO TABS
50.0000 mg | ORAL_TABLET | Freq: Four times a day (QID) | ORAL | Status: DC
Start: 2020-12-18 — End: 2020-12-19
  Administered 2020-12-18 – 2020-12-19 (×3): 50 mg via ORAL
  Filled 2020-12-18 (×3): qty 1

## 2020-12-18 MED ORDER — KETOROLAC TROMETHAMINE 30 MG/ML IJ SOLN
INTRAMUSCULAR | Status: DC | PRN
  Administered 2020-12-18: 30 mg via INTRA_ARTICULAR

## 2020-12-18 MED ORDER — ACETAMINOPHEN 500 MG PO TABS
ORAL_TABLET | ORAL | Status: AC
  Administered 2020-12-18: 1000 mg via ORAL
  Filled 2020-12-18: qty 2

## 2020-12-18 MED ORDER — DIPHENHYDRAMINE HCL 12.5 MG/5ML PO ELIX: 12.5000 mg | ORAL_SOLUTION | ORAL | Status: DC | PRN

## 2020-12-18 MED ORDER — TRANEXAMIC ACID-NACL 1000-0.7 MG/100ML-% IV SOLN
1000.0000 mg | INTRAVENOUS | Status: AC
  Administered 2020-12-18: 1000 mg via INTRAVENOUS

## 2020-12-18 MED ORDER — OXYCODONE HCL 5 MG PO TABS
10.0000 mg | ORAL_TABLET | ORAL | Status: DC | PRN
  Administered 2020-12-18: 10 mg via ORAL
  Administered 2020-12-18: 15 mg via ORAL
  Filled 2020-12-18: qty 2
  Filled 2020-12-18: qty 3

## 2020-12-18 MED ORDER — EPHEDRINE SULFATE 50 MG/ML IJ SOLN
INTRAMUSCULAR | Status: DC | PRN
Start: 2020-12-18 — End: 2020-12-18
  Administered 2020-12-18: 10 mg via INTRAVENOUS
  Administered 2020-12-18: 5 mg via INTRAVENOUS
  Administered 2020-12-18: 10 mg via INTRAVENOUS

## 2020-12-18 MED ORDER — SODIUM CHLORIDE 0.9 % IR SOLN
Status: DC | PRN
  Administered 2020-12-18: 4016 mL

## 2020-12-18 MED ORDER — CEFAZOLIN SODIUM-DEXTROSE 2-4 GM/100ML-% IV SOLN
2.0000 g | INTRAVENOUS | Status: AC
  Administered 2020-12-18: 2 g via INTRAVENOUS

## 2020-12-18 MED ORDER — ACETAMINOPHEN 500 MG PO TABS
1000.0000 mg | ORAL_TABLET | Freq: Four times a day (QID) | ORAL | Status: AC
  Administered 2020-12-18 – 2020-12-19 (×3): 1000 mg via ORAL
  Filled 2020-12-18 (×3): qty 2

## 2020-12-18 MED ORDER — ONDANSETRON HCL 4 MG PO TABS
4.0000 mg | ORAL_TABLET | Freq: Four times a day (QID) | ORAL | Status: DC | PRN
  Administered 2020-12-19: 4 mg via ORAL
  Filled 2020-12-18: qty 1

## 2020-12-18 MED ORDER — BISACODYL 5 MG PO TBEC: 10.0000 mg | DELAYED_RELEASE_TABLET | Freq: Every day | ORAL | Status: DC | PRN

## 2020-12-18 MED ORDER — GLYCOPYRROLATE 0.2 MG/ML IJ SOLN
INTRAMUSCULAR | Status: DC | PRN
  Administered 2020-12-18: .2 mg via INTRAVENOUS

## 2020-12-18 MED ORDER — CHLORHEXIDINE GLUCONATE 0.12 % MT SOLN: 15.0000 mL | Freq: Once | OROMUCOSAL | Status: DC

## 2020-12-18 MED ORDER — PROPOFOL 500 MG/50ML IV EMUL
INTRAVENOUS | Status: DC | PRN
  Administered 2020-12-18: 50 ug/kg/min via INTRAVENOUS

## 2020-12-18 MED ORDER — METHOCARBAMOL 1000 MG/10ML IJ SOLN
500.0000 mg | Freq: Four times a day (QID) | INTRAVENOUS | Status: DC | PRN
  Filled 2020-12-18: qty 5

## 2020-12-18 MED ORDER — METFORMIN HCL ER 750 MG PO TB24
1500.0000 mg | ORAL_TABLET | Freq: Every day | ORAL | Status: DC
  Administered 2020-12-19: 1500 mg via ORAL
  Filled 2020-12-18: qty 2

## 2020-12-18 MED ORDER — BUPIVACAINE-EPINEPHRINE (PF) 0.25% -1:200000 IJ SOLN
INTRAMUSCULAR | Status: AC
  Filled 2020-12-18: qty 30

## 2020-12-18 MED ORDER — DEXMEDETOMIDINE (PRECEDEX) IN NS 20 MCG/5ML (4 MCG/ML) IV SYRINGE
PREFILLED_SYRINGE | INTRAVENOUS | Status: AC
  Filled 2020-12-18: qty 5

## 2020-12-18 MED ORDER — FLUOXETINE HCL 10 MG PO CAPS
10.0000 mg | ORAL_CAPSULE | Freq: Every day | ORAL | Status: DC
  Administered 2020-12-19: 10 mg via ORAL
  Filled 2020-12-18: qty 1

## 2020-12-18 MED ORDER — EPHEDRINE 5 MG/ML INJ
INTRAVENOUS | Status: AC
  Filled 2020-12-18: qty 5

## 2020-12-18 MED ORDER — ONDANSETRON HCL 4 MG/2ML IJ SOLN: 4.0000 mg | Freq: Once | INTRAMUSCULAR | Status: DC | PRN

## 2020-12-18 MED ORDER — FENTANYL CITRATE (PF) 100 MCG/2ML IJ SOLN
25.0000 ug | INTRAMUSCULAR | Status: DC | PRN
  Administered 2020-12-18 (×3): 25 ug via INTRAVENOUS

## 2020-12-18 MED ORDER — CLINDAMYCIN PHOSPHATE 600 MG/50ML IV SOLN
INTRAVENOUS | Status: AC
  Filled 2020-12-18: qty 50

## 2020-12-18 MED ORDER — BUPIVACAINE HCL (PF) 0.5 % IJ SOLN
INTRAMUSCULAR | Status: AC
  Filled 2020-12-18: qty 10

## 2020-12-18 MED ORDER — MIDAZOLAM HCL 2 MG/2ML IJ SOLN
INTRAMUSCULAR | Status: AC
  Filled 2020-12-18: qty 2

## 2020-12-18 MED ORDER — NITROGLYCERIN 0.4 MG SL SUBL: 0.4000 mg | SUBLINGUAL_TABLET | SUBLINGUAL | Status: DC | PRN

## 2020-12-18 MED ORDER — OXYCODONE HCL 5 MG PO TABS
5.0000 mg | ORAL_TABLET | ORAL | Status: DC | PRN
  Administered 2020-12-18: 10 mg via ORAL

## 2020-12-18 MED ORDER — NEOMYCIN-POLYMYXIN B GU 40-200000 IR SOLN
Status: AC
  Filled 2020-12-18: qty 20

## 2020-12-18 MED ORDER — DOCUSATE SODIUM 100 MG PO CAPS
100.0000 mg | ORAL_CAPSULE | Freq: Two times a day (BID) | ORAL | Status: DC
  Administered 2020-12-18 – 2020-12-19 (×2): 100 mg via ORAL
  Filled 2020-12-18 (×2): qty 1

## 2020-12-18 MED ORDER — MORPHINE SULFATE (PF) 4 MG/ML IV SOLN
INTRAVENOUS | Status: AC
  Filled 2020-12-18: qty 1

## 2020-12-18 MED ORDER — FENTANYL CITRATE (PF) 100 MCG/2ML IJ SOLN
INTRAMUSCULAR | Status: AC
  Administered 2020-12-18: 25 ug via INTRAVENOUS
  Filled 2020-12-18: qty 2

## 2020-12-18 MED ORDER — CLINDAMYCIN PHOSPHATE 600 MG/50ML IV SOLN
600.0000 mg | Freq: Once | INTRAVENOUS | Status: AC
Start: 2020-12-18 — End: 2020-12-18
  Administered 2020-12-18: 600 mg via INTRAVENOUS

## 2020-12-18 MED ORDER — AMLODIPINE BESYLATE 5 MG PO TABS
2.5000 mg | ORAL_TABLET | Freq: Every day | ORAL | Status: DC
  Administered 2020-12-19: 2.5 mg via ORAL
  Filled 2020-12-18: qty 1

## 2020-12-18 MED ORDER — FAMOTIDINE 20 MG PO TABS: 20.0000 mg | ORAL_TABLET | Freq: Once | ORAL | Status: AC

## 2020-12-18 MED ORDER — SODIUM CHLORIDE (PF) 0.9 % IJ SOLN
INTRAMUSCULAR | Status: AC
  Filled 2020-12-18: qty 50

## 2020-12-18 MED ORDER — CEFAZOLIN SODIUM-DEXTROSE 2-4 GM/100ML-% IV SOLN
2.0000 g | Freq: Four times a day (QID) | INTRAVENOUS | Status: AC
  Administered 2020-12-18: 2 g via INTRAVENOUS
  Filled 2020-12-18: qty 100

## 2020-12-18 MED ORDER — CHLORHEXIDINE GLUCONATE 0.12 % MT SOLN
OROMUCOSAL | Status: AC
  Filled 2020-12-18: qty 15

## 2020-12-18 MED ORDER — ASPIRIN EC 81 MG PO TBEC
81.0000 mg | DELAYED_RELEASE_TABLET | Freq: Every day | ORAL | Status: DC
  Administered 2020-12-19: 81 mg via ORAL
  Filled 2020-12-18: qty 1

## 2020-12-18 MED ORDER — KETOROLAC TROMETHAMINE 30 MG/ML IJ SOLN
INTRAMUSCULAR | Status: AC
  Filled 2020-12-18: qty 1

## 2020-12-18 MED ORDER — LIDOCAINE HCL URETHRAL/MUCOSAL 2 % EX GEL
CUTANEOUS | Status: AC
  Filled 2020-12-18: qty 5

## 2020-12-18 MED ORDER — LEVOTHYROXINE SODIUM 50 MCG PO TABS
125.0000 ug | ORAL_TABLET | Freq: Every day | ORAL | Status: DC
  Administered 2020-12-19: 125 ug via ORAL
  Filled 2020-12-18: qty 1

## 2020-12-18 MED ORDER — TRAMADOL HCL 50 MG PO TABS
ORAL_TABLET | ORAL | Status: AC
  Administered 2020-12-18: 50 mg via ORAL
  Filled 2020-12-18: qty 1

## 2020-12-18 MED ORDER — HYDROMORPHONE HCL 1 MG/ML IJ SOLN: 0.5000 mg | INTRAMUSCULAR | Status: DC | PRN

## 2020-12-18 MED ORDER — MIDAZOLAM HCL 5 MG/5ML IJ SOLN
INTRAMUSCULAR | Status: DC | PRN
  Administered 2020-12-18: 1 mg via INTRAVENOUS
  Administered 2020-12-18: 2 mg via INTRAVENOUS
  Administered 2020-12-18: 1 mg via INTRAVENOUS

## 2020-12-18 MED ORDER — OXYCODONE HCL 5 MG PO TABS
ORAL_TABLET | ORAL | Status: AC
  Filled 2020-12-18: qty 2

## 2020-12-18 MED ORDER — ORAL CARE MOUTH RINSE: 15.0000 mL | Freq: Once | OROMUCOSAL | Status: DC

## 2020-12-18 MED ORDER — METHOCARBAMOL 500 MG PO TABS
500.0000 mg | ORAL_TABLET | Freq: Four times a day (QID) | ORAL | Status: DC | PRN
  Administered 2020-12-18 – 2020-12-19 (×3): 500 mg via ORAL
  Filled 2020-12-18 (×3): qty 1

## 2020-12-18 MED ORDER — PHENYLEPHRINE HCL-NACL 20-0.9 MG/250ML-% IV SOLN
INTRAVENOUS | Status: AC
  Filled 2020-12-18: qty 250

## 2020-12-18 MED ORDER — LOSARTAN POTASSIUM 50 MG PO TABS
100.0000 mg | ORAL_TABLET | Freq: Every day | ORAL | Status: DC
  Administered 2020-12-19: 100 mg via ORAL
  Filled 2020-12-18: qty 2

## 2020-12-18 MED ORDER — ACETAMINOPHEN 325 MG PO TABS: 325.0000 mg | ORAL_TABLET | Freq: Four times a day (QID) | ORAL | Status: DC | PRN

## 2020-12-18 SURGICAL SUPPLY — 67 items
BLADE SAW 90X13X1.19 OSCILLAT (BLADE) ×2 IMPLANT
BLADE SAW 90X25X1.19 OSCILLAT (BLADE) ×2 IMPLANT
CEMENT HV SMART SET (Cement) ×4 IMPLANT
CEMENT TIBIA MBT (Knees) ×1 IMPLANT
COOLER POLAR GLACIER W/PUMP (MISCELLANEOUS) ×2 IMPLANT
CUFF TOURN SGL QUICK 34 (TOURNIQUET CUFF) ×1
CUFF TRNQT CYL 34X4.125X (TOURNIQUET CUFF) IMPLANT
CUFF TRNQT CYL 34X4X40X1 (TOURNIQUET CUFF) ×1 IMPLANT
DRAPE 3/4 80X56 (DRAPES) ×4 IMPLANT
DRAPE IMP U-DRAPE 54X76 (DRAPES) ×4 IMPLANT
DRAPE INCISE IOBAN 66X60 STRL (DRAPES) ×2 IMPLANT
DRAPE SURG 17X11 SM STRL (DRAPES) ×4 IMPLANT
DRSG AQUACEL AG ADV 3.5X10 (GAUZE/BANDAGES/DRESSINGS) ×2 IMPLANT
DURAPREP 26ML APPLICATOR (WOUND CARE) ×8 IMPLANT
ELECT REM PT RETURN 9FT ADLT (ELECTROSURGICAL) ×2
ELECTRODE REM PT RTRN 9FT ADLT (ELECTROSURGICAL) ×1 IMPLANT
FEMUR SIGMA PS SZ 4.0N R (Femur) ×2 IMPLANT
GAUZE 4X4 16PLY ~~LOC~~+RFID DBL (SPONGE) ×2 IMPLANT
GAUZE SPONGE 4X4 12PLY STRL (GAUZE/BANDAGES/DRESSINGS) ×2 IMPLANT
GLOVE SRG 8 PF TXTR STRL LF DI (GLOVE) ×2 IMPLANT
GLOVE SURG ENC TEXT LTX SZ7.5 (GLOVE) ×2 IMPLANT
GLOVE SURG ORTHO LTX SZ9 (GLOVE) ×4 IMPLANT
GLOVE SURG SYN 7.5  E (GLOVE) ×1
GLOVE SURG SYN 7.5 E (GLOVE) ×1 IMPLANT
GLOVE SURG UNDER POLY LF SZ8 (GLOVE) ×2
GLOVE SURG UNDER POLY LF SZ9 (GLOVE) ×2 IMPLANT
GOWN STRL REUS TWL 2XL XL LVL4 (GOWN DISPOSABLE) ×2 IMPLANT
GOWN STRL REUS W/ TWL LRG LVL3 (GOWN DISPOSABLE) ×1 IMPLANT
GOWN STRL REUS W/ TWL LRG LVL4 (GOWN DISPOSABLE) ×1 IMPLANT
GOWN STRL REUS W/ TWL XL LVL3 (GOWN DISPOSABLE) ×1 IMPLANT
GOWN STRL REUS W/TWL LRG LVL3 (GOWN DISPOSABLE) ×1
GOWN STRL REUS W/TWL LRG LVL4 (GOWN DISPOSABLE) ×1
GOWN STRL REUS W/TWL XL LVL3 (GOWN DISPOSABLE) ×1
HOLDER FOLEY CATH W/STRAP (MISCELLANEOUS) ×2 IMPLANT
IMMBOLIZER KNEE 19 BLUE UNIV (SOFTGOODS) ×2 IMPLANT
IV NS IRRIG 3000ML ARTHROMATIC (IV SOLUTION) ×2 IMPLANT
KIT TURNOVER KIT A (KITS) ×2 IMPLANT
MANIFOLD NEPTUNE II (INSTRUMENTS) ×4 IMPLANT
NDL SAFETY ECLIPSE 18X1.5 (NEEDLE) IMPLANT
NEEDLE HYPO 18GX1.5 SHARP (NEEDLE)
NEEDLE HYPO 22GX1.5 SAFETY (NEEDLE) ×2 IMPLANT
NEEDLE SPNL 20GX3.5 QUINCKE YW (NEEDLE) ×2 IMPLANT
NS IRRIG 1000ML POUR BTL (IV SOLUTION) ×2 IMPLANT
PACK TOTAL KNEE (MISCELLANEOUS) ×2 IMPLANT
PAD WRAPON POLAR KNEE (MISCELLANEOUS) ×1 IMPLANT
PATELLA DOME PFC 35MM (Knees) ×2 IMPLANT
PENCIL SMOKE EVACUATOR COATED (MISCELLANEOUS) ×2 IMPLANT
PIN DRILL FIX HALF THREAD (BIT) ×4 IMPLANT
PIN FIXATION 1/8DIA X 3INL (PIN) ×2 IMPLANT
PLATE ROT INSERT 10MM SIZE 4 (Plate) ×2 IMPLANT
PULSAVAC PLUS IRRIG FAN TIP (DISPOSABLE) ×2
SPONGE T-LAP 18X18 ~~LOC~~+RFID (SPONGE) ×6 IMPLANT
STAPLER SKIN PROX 35W (STAPLE) ×2 IMPLANT
SUCTION FRAZIER HANDLE 10FR (MISCELLANEOUS) ×1
SUCTION TUBE FRAZIER 10FR DISP (MISCELLANEOUS) ×1 IMPLANT
SUT ETHIBOND NAB CT1 #1 30IN (SUTURE) ×6 IMPLANT
SUT VIC AB 0 CT1 36 (SUTURE) ×4 IMPLANT
SUT VIC AB 2-0 CT1 (SUTURE) ×4 IMPLANT
SYR 20ML LL LF (SYRINGE) IMPLANT
SYR 30ML LL (SYRINGE) ×4 IMPLANT
TIBIA MBT CEMENT (Knees) ×2 IMPLANT
TIP FAN IRRIG PULSAVAC PLUS (DISPOSABLE) ×1 IMPLANT
TOWER CARTRIDGE SMART MIX (DISPOSABLE) ×2 IMPLANT
TRAY FOLEY MTR SLVR 16FR STAT (SET/KITS/TRAYS/PACK) ×2 IMPLANT
TUBE SUCT KAM VAC (TUBING) ×2 IMPLANT
WATER STERILE IRR 1000ML POUR (IV SOLUTION) ×2 IMPLANT
WRAPON POLAR PAD KNEE (MISCELLANEOUS) ×2

## 2020-12-18 NOTE — Progress Notes (Signed)
Patient arrived to room 149 in NAD, VS stable. Patient oriented to room and call bell in reach. Family at bedside.

## 2020-12-18 NOTE — Transfer of Care (Signed)
Immediate Anesthesia Transfer of Care Note  Patient: [name]  Procedure(s) Performed: RIGHT TOTAL KNEE ARTHROPLASTY (Right: Knee)  Patient Location: PACU  Anesthesia Type:Spinal  Level of Consciousness: awake, alert  and oriented  Airway & Oxygen Therapy: Patient Spontanous Breathing  Post-op Assessment: Report given to RN and Post -op Vital signs reviewed and stable  Post vital signs: Reviewed  Last Vitals:  Vitals Value Taken Time  BP 103/49 12/18/20 1204  Temp 36.3 C 12/18/20 1204  Pulse 64 12/18/20 1206  Resp 0 12/18/20 1206  SpO2 97 % 12/18/20 1206  Vitals shown include unvalidated device data.  Last Pain:         Complications: No notable events documented.

## 2020-12-18 NOTE — H&P (Addendum)
PREOPERATIVE H&P  Chief Complaint: Right Knee Osteoarthritis  HPI: [name] is a 75 y.o. female who presents for preoperative history and physical with a diagnosis of Right Knee Osteoarthritis. Symptoms of ,  and  are significantly impairing activities of daily living.  She has agreed with surgical management.   Past Medical History:  Diagnosis Date   Coronary artery disease    a. 07/2013 STEMI/PCI ([place]): LM nl, LAD 100p (thrombectomy & 3.5x23 Alpine DES), LCX nl, RCA nl, EF 45%.   Depression    Diastolic dysfunction    a.) TTE 11/26/2020 - LVEF 50-55%; G2DD.   Endometrial cancer ([place])    Gravida 3 para 3    Hyperlipidemia    Hypertension    Hypertensive heart disease    Hypothyroid    Ischemic cardiomyopathy    a.) 07/2013 EF 45% @ time of STEMI; b.) 11/2013 Echo: EF 55-60%, mildly dil LA, nl RV fxn. c.) TTE 07/29/2015 - LVEF 55-60%, mild LA and RV dilation. d.) TTE 11/26/2020 - LVEF 50-55%; G2DD.   Menopause    Obstructive sleep apnea on CPAP    Osteoarthritis    ST elevation myocardial infarction (STEMI) of anterolateral wall ([place]) 08/14/2013   a.) LVEF reduced at 45%; Tx'd with trombectomy and PCI to 100% LAD; 3.5 x 23 mm Xience Alpine DES placed.   T2DM (type 2 diabetes mellitus) ([place])    Thyroid cancer ([place])    a.) s/p total thyroidectomy. b.) on daily levothyroxine.   Past Surgical History:  Procedure Laterality Date   ABDOMINAL HYSTERECTOMY     CATARACT EXTRACTION W/PHACO Left 08/30/2016   Procedure: CATARACT EXTRACTION PHACO AND INTRAOCULAR LENS PLACEMENT ([place]) Left daibetic;  Surgeon: Leandrew Koyanagi, MD;  Location: [facility];  Service: Ophthalmology;  Laterality: Left;  Diabetic - insulin and oral meds sleep apnea   CATARACT EXTRACTION W/PHACO Right 10/07/2016   Procedure: CATARACT EXTRACTION PHACO AND INTRAOCULAR LENS PLACEMENT ([facility]);  Surgeon: Leandrew Koyanagi, MD;  Location: [facility];  Service: Ophthalmology;  Laterality: Right;  Lot#  [id number] H [place]: 00:43.3 AP%: 12.2 CDE: 5.28   CORONARY ANGIOPLASTY WITH STENT PLACEMENT Left 08/14/2013   Procedure: CORONARY ANGIOPLASTY WITH STENT PLACEMENT (3.5 x 23 mm Xience Alpine DES to LAD); Location: [facility], [place]; Surgeon: Algernon Huxley, MD   GANGLION CYST EXCISION     LAPAROSCOPIC HYSTERECTOMY     THYROIDECTOMY     Social History   Socioeconomic History   Marital status: Married    Spouse name: Not on file   Number of children: Not on file   Years of education: Not on file   Highest education level: Not on file  Occupational History   Occupation: retired  Tobacco Use   Smoking status: Never   Smokeless tobacco: Never  Vaping Use   Vaping Use: Never used  Substance and Sexual Activity   Alcohol use: No   Drug use: No   Sexual activity: Not on file  Other Topics Concern   Not on file  Social History Narrative   Not on file   Social Determinants of Health   Financial Resource Strain: Low Risk    Difficulty of Paying Living Expenses: Not hard at all  Food Insecurity: No Food Insecurity   Worried About Charity fundraiser in the Last Year: Never true   [place] in the Last Year: Never true  Transportation Needs: No Transportation Needs   Lack of Transportation (Medical): No   Lack  of Transportation (Non-Medical): No  Physical Activity: Inactive   Days of Exercise per Week: 0 days   Minutes of Exercise per Session: 0 min  Stress: No Stress Concern Present   Feeling of Stress : Only a little  Social Connections: Not on file   Family History  Problem Relation Age of Onset   Heart attack Father    Heart failure Father    Hypertension Father    Hyperlipidemia Father    Heart disease Father 68       CABG    Pulmonary fibrosis Son    Breast cancer Neg Hx    No Known Allergies Prior to Admission medications   Medication Sig Start Date End Date Taking? Authorizing Provider  ACCU-CHEK AVIVA PLUS test strip U UTD BID 11/10/16  Yes  Karamalegos, Devonne Doughty, DO  acetaminophen (TYLENOL) 500 MG tablet Take 1,000 mg by mouth every 6 (six) hours as needed for moderate pain. 03/26/14  Yes [provider]  amLODipine (NORVASC) 2.5 MG tablet Take 1 tablet (2.5 mg total) by mouth daily. 01/29/20  Yes Minna Merritts, MD  aspirin 81 MG tablet Take 81 mg by mouth daily.   Yes [provider]  carvedilol (COREG) 3.125 MG tablet Take 1 tablet (3.125 mg total) by mouth 2 (two) times daily. Patient taking differently: Take 3.125 mg by mouth in the morning. 01/29/20  Yes Minna Merritts, MD  ezetimibe (ZETIA) 10 MG tablet Take 1 tablet (10 mg total) by mouth daily. 01/29/20  Yes Minna Merritts, MD  FLUoxetine (PROZAC) 10 MG capsule Take 1 capsule (10 mg total) by mouth daily. 07/01/20  Yes Karamalegos, Devonne Doughty, DO  levothyroxine (SYNTHROID) 125 MCG tablet TAKE 1 TABLET BY MOUTH  DAILY BEFORE BREAKFAST 01/29/20  Yes Gollan, Kathlene November, MD  losartan (COZAAR) 100 MG tablet TAKE 1 TABLET BY MOUTH  DAILY 12/03/20  Yes Gollan, Kathlene November, MD  metFORMIN (GLUCOPHAGE-XR) 500 MG 24 hr tablet Take 3 tablets (1,500 mg total) by mouth daily with breakfast. 07/01/20  Yes Karamalegos, Devonne Doughty, DO  nitroGLYCERIN (NITROSTAT) 0.4 MG SL tablet Place 1 tablet (0.4 mg total) under the tongue every 5 (five) minutes as needed for chest pain. 01/29/20  Yes Gollan, Kathlene November, MD  OZEMPIC, 0.25 OR 0.5 MG/DOSE, 2 MG/1.5ML SOPN INJECT 0.5MG  SUBCUTANEOUSLY ONCE WEEKLY AS DIRECTED 05/09/20  Yes Karamalegos, Devonne Doughty, DO  rosuvastatin (CRESTOR) 20 MG tablet Take 1 tablet (20 mg total) by mouth daily. 01/29/20  Yes Minna Merritts, MD     Positive ROS: All other systems have been reviewed and were otherwise negative with the exception of those mentioned in the HPI and as above.  Physical Exam: General: Alert, no acute distress Cardiovascular: Regular rate and rhythm, no murmurs rubs or gallops.  No pedal edema Respiratory: Clear to  auscultation bilaterally, no wheezes rales or rhonchi. No cyanosis, no use of accessory musculature GI: No organomegaly, abdomen is soft and non-tender nondistended with positive bowel sounds. Skin: Skin intact, no lesions within the operative field. Neurologic: Sensation intact distally Psychiatric: Patient is competent for consent with normal mood and affect Lymphatic: No cervical lymphadenopathy  MUSCULOSKELETAL: Right Knee: The patient's skin is intact. She lacks approximately 5 degrees of full extension and can flex to approximately 110 degrees. She has tenderness over medial joint line and has patellofemoral crepitus and mild grind, but no apprehension. She has no ligamentous laxity. She has no calf tenderness or lower leg edema. She has 5/5  strength in all muscle groups, intact sensation to light touch and palpable pedal pulses.   Radiology: I reviewed bilateral knee x-rays taken on 10/02/2020 which show advanced tricompartmental osteoarthritis in both knees. The patient has no evidence of fracture, dislocation or other osseous abnormality. The patient has evidence of bone-on-bone contact in the medial compartment of both knees with subchondral sclerosis, cyst formation and large osteophytes.   Assessment: Right Knee Osteoarthritis  Plan: Plan for Procedure(s): RIGHT TOTAL KNEE ARTHROPLASTY  I reviewed with the patient this morning the details of the operation as well as the postoperative course.  I marked the right knee according hospital's correct site of surgery protocol and after verbally confirming with the patient that this was the correct site of surgery.  I reviewed the patient's labs and radiographs in preparation for this case.  I discussed the risks and benefits of surgery. The risks include but are not limited to infection requiring the removal of the prosthesis, bleeding requiring blood transfusion, nerve or blood vessel injury leading to permanent disability, tendon or  ligament injury joint stiffness or loss of motion, persistent pain, weakness or instability, loosening or failure of the hardware and the need for further surgery. Medical risks include but are not limited to DVT and pulmonary embolism, myocardial infarction, stroke, pneumonia, respiratory failure and death. Patient understood these risks and wished to proceed.     Thornton Park, MD   12/18/2020 7:59 AM

## 2020-12-18 NOTE — Evaluation (Signed)
Physical Therapy Evaluation Patient Details Name: [name] MRN: [id number] DOB: [birth year] Today's Date: 12/18/2020  History of Present Illness  Pt is a 75 y.o. female s/p R TKA secondary OA 12/18/20.  PMH includes sleep apnea with CPAP, DM, htn, h/o MI, CAD, cardiac stents, endometrial CA, Gravida 3 para 3, and thyroid CA (s/p thyroidectomy).  Clinical Impression  Prior to hospital admission, pt was independent with ambulation; lives alone in 1 level home with 3 STE L railing; pt's daughter able to assist intermittently and pt's cousin (who is a Marine scientist) is coming to stay with pt for 10 days to assist as needed.  Currently pt is min assist semi-supine to sitting edge of bed; min assist with transfers using RW; and CGA to ambulate 20 feet with RW.  Pain 4/10 R knee at rest beginning/end of session and 5-6/10 with ambulation.  Able to perform R LE SLR with minimal assist.  Pt would benefit from skilled PT to address noted impairments and functional limitations (see below for any additional details).  Upon hospital discharge, pt would benefit from OP PT (pt reports already having OP PT set up at [facility] this coming Tuesday at 1pm).   Recommendations for follow up therapy are one component of a multi-disciplinary discharge planning process, led by the attending physician.  Recommendations may be updated based on patient status, additional functional criteria and insurance authorization.  Follow Up Recommendations Outpatient PT (pt has OP PT set up already)    Equipment Recommendations  Rolling walker with 5" wheels    Recommendations for Other Services OT consult     Precautions / Restrictions Precautions Precautions: Knee;Fall Restrictions Weight Bearing Restrictions: Yes RLE Weight Bearing: Weight bearing as tolerated      Mobility  Bed Mobility Overal bed mobility: Needs Assistance Bed Mobility: Supine to Sit     Supine to sit: Min assist;HOB elevated     General bed  mobility comments: assist for R LE; vc's for technique    Transfers Overall transfer level: Needs assistance Equipment used: Rolling walker (2 wheeled) Transfers: Sit to/from Stand Sit to Stand: Min assist         General transfer comment: vc's for UE/LE placement and overall technique  Ambulation/Gait Ambulation/Gait assistance: Min guard Gait Distance (Feet): 20 Feet Assistive device: Rolling walker (2 wheeled)   Gait velocity: decreased   General Gait Details: antalgic; decreased stance time R LE; step to gait pattern; vc's for walker use and to increase UE support through RW to [employer] R LE  Stairs            Wheelchair Mobility    Modified Rankin (Stroke Patients Only)       Balance Overall balance assessment: Needs assistance Sitting-balance support: No upper extremity supported;Feet supported Sitting balance-Leahy Scale: Good Sitting balance - Comments: steady sitting reaching within BOS   Standing balance support: Single extremity supported Standing balance-Leahy Scale: Fair Standing balance comment: steady static standing with at least single UE support                             Pertinent Vitals/Pain Pain Assessment: 0-10 Pain Score: 4  (6/10 with mobility; 4/10 at rest beginning/end of PT session) Pain Location: R knee Pain Descriptors / Indicators: Aching Pain Intervention(s): Limited activity within patient's tolerance;Monitored during session;Premedicated before session;Repositioned;Other (comment) (polar care applied) Vitals (HR and O2 on room air) stable and WFL throughout treatment session.  Home Living Family/patient expects to be discharged to:: Private residence Living Arrangements: Alone Available Help at Discharge: Family Type of Home: House Home Access: Stairs to enter Entrance Stairs-Rails: Left Entrance Stairs-Number of Steps: 3 Home Layout: One level Home Equipment: Shower seat - built in;Grab bars -  toilet;Grab bars - tub/shower      Prior Function Level of Independence: Independent               Hand Dominance        Extremity/Trunk Assessment   Upper Extremity Assessment Upper Extremity Assessment: Overall WFL for tasks assessed    Lower Extremity Assessment Lower Extremity Assessment: RLE deficits/detail (L LE WFL) RLE Deficits / Details: minimal assist for R LE SLR; at least 3/5 AROM ankle DF/PF RLE: Unable to fully assess due to pain    Cervical / Trunk Assessment Cervical / Trunk Assessment: Normal  Communication   Communication: No difficulties  Cognition Arousal/Alertness: Awake/alert Behavior During Therapy: WFL for tasks assessed/performed Overall Cognitive Status: Within Functional Limits for tasks assessed                                        General Comments  Nursing cleared pt for participation in physical therapy.  Pt agreeable to PT session.    Exercises Total Joint Exercises Ankle Circles/Pumps: AROM;Strengthening;Both;10 reps;Supine Quad Sets: AROM;Strengthening;Both;10 reps;Supine Heel Slides: AAROM;Strengthening;Right;10 reps;Supine Hip ABduction/ADduction: AAROM;Strengthening;Right;10 reps;Supine Straight Leg Raises: AAROM;Strengthening;Right;10 reps;Supine Goniometric ROM: R knee grossly 10-60 degrees   Assessment/Plan    PT Assessment Patient needs continued PT services  PT Problem List Decreased strength;Decreased range of motion;Decreased activity tolerance;Decreased balance;Decreased mobility;Decreased knowledge of use of DME;Decreased knowledge of precautions;Pain;Decreased skin integrity       PT Treatment Interventions DME instruction;Gait training;Stair training;Functional mobility training;Therapeutic activities;Therapeutic exercise;Balance training;Patient/family education    PT Goals (Current goals can be found in the Care Plan section)  Acute Rehab PT Goals Patient Stated Goal: to go home  tomorrow PT Goal Formulation: With patient Time For Goal Achievement: 01/01/21 Potential to Achieve Goals: Fair    Frequency BID   Barriers to discharge        Co-evaluation               AM-PAC PT "6 Clicks" Mobility  Outcome Measure Help needed turning from your back to your side while in a flat bed without using bedrails?: None Help needed moving from lying on your back to sitting on the side of a flat bed without using bedrails?: A Little Help needed moving to and from a bed to a chair (including a wheelchair)?: A Little Help needed standing up from a chair using your arms (e.g., wheelchair or bedside chair)?: A Little Help needed to walk in hospital room?: A Little Help needed climbing 3-5 steps with a railing? : A Lot 6 Click Score: 18    End of Session Equipment Utilized During Treatment: Gait belt;Right knee immobilizer Activity Tolerance: Patient tolerated treatment well Patient left: in chair;with call bell/phone within reach;with nursing/sitter in room Nurse Communication: Mobility status;Precautions;Other (comment);Weight bearing status (pt's pain status) PT Visit Diagnosis: Other abnormalities of gait and mobility (R26.89);Muscle weakness (generalized) (M62.81);Pain Pain - Right/Left: Right Pain - part of body: Knee    Time: 6761-9509 PT Time Calculation (min) (ACUTE ONLY): 29 min   Charges:   PT Evaluation $PT Eval Low Complexity: 1 Low PT Treatments $Therapeutic Activity: 8-22  mins       Leitha Bleak, PT 12/18/20, 5:00 PM

## 2020-12-18 NOTE — Op Note (Signed)
DATE OF SURGERY:  12/18/2020 TIME: 12:48 PM  PATIENT NAME:  [name]   AGE: 75 y.o.    PRE-OPERATIVE DIAGNOSIS:  Right Knee Osteoarthritis  POST-OPERATIVE DIAGNOSIS:  Same  PROCEDURE:  Procedure(s): RIGHT TOTAL KNEE ARTHROPLASTY  SURGEON:  Thornton Park, MD   ASSISTANT:  Roland Rack, PA  OPERATIVE IMPLANTS: Depuy PFC Sigma, Posterior Stabilized Femural component size 4 Narrow, Tibia size rotating platform component size 3, Patella polyethylene 3-peg oval button size 35, with a 35 mm polyethylene insert.  EBL:  50 cc  TOURNIQUET TIME:  132 minutes  PREOPERATIVE INDICATIONS:  [name] is an 74 y.o. female who has a diagnosis of  Right Knee Osteoarthritis and elected for a right total knee arthroplasty after failing nonoperative treatment.  Their knee pain significantly impacts their activity of daily living.  Radiographs have demonstrated tricompartmental osteoarthritis joint space narrowing, osteophytes, subchondral sclerosis and cyst formation.  The risks, benefits, and alternatives were discussed at length including but not limited to the risks of infection, bleeding, nerve or blood vessel injury, knee stiffness, fracture, dislocation, loosening or failure of the hardware and the need for further surgery. Medical risks include but not limited to DVT and pulmonary embolism, myocardial infarction, stroke, pneumonia, respiratory failure and death. I discussed these risks with the patient in my office prior to the date of surgery. The patient understood these risks and was willing to proceed.  OPERATIVE FINDINGS AND UNIQUE ASPECTS OF THE CASE: Advanced tricompartmental osteoarthritis  OPERATIVE DESCRIPTION:  The patient was brought to the operative room and placed in a supine position after undergoing placement of a spinal anesthetic.  A Foley catheter was placed.  IV antibiotics were given. Patient received Ancef and clindamycin and tranexamic acid prior to  inflation of the tourniquet.. The lower extremity was prepped and draped in the usual sterile fashion.  A time out was performed to verify the patient's name, date of birth, medical record number, correct site of surgery and correct procedure to be performed. The timeout was also used to confirm the patient received antibiotics and that appropriate instruments, implants and radiographs studies were available in the room.  The leg was elevated and exsanguinated with an Esmarch and the tourniquet was inflated to 275 mmHg for 1132 minutes.  Patient had significant limitation to the right knee range of motion including -10 degrees of full extension to approximately 85 to 90 degrees of flexion.  A midline incision was made over the right knee. Full-thickness skin flaps were developed. A medial parapatellar arthrotomy was then made and the patella everted and the knee was brought into 90 of flexion. Hoffa's fat pad along with the cruciate ligaments and medial and lateral menisci were resected.   The distal femoral intramedullary canal was opened with a drill and the intramedullary distal femoral cutting jig was inserted into the femoral canal pinned into position. It was set at 5 degrees and required a total of 11 mm off the distal femur given her significant flexion contracture.  Care was taken to protect the collateral ligaments during distal femoral resection.  The distal femoral resection was performed with an oscillating saw. The femoral cutting guide was then removed.  The extramedullary tibial cutting guide was then placed using the anterior tibial crest and second ray of the foot as a references.  The tibial cutting guide was adjusted to allow for appropriate posterior slope.  The tibial cutting block was pinned into position. The slotted stylus was used to measure  the proximal tibial resection of an estimated 8 mm initially off the high lateral side.  A long rod alignment guide was then used to confirm  position of the cutting block. A third cross pin through the tibial cutting block was then drilled into position to allow for rotational stability. Care was taken during the tibial resection to protect the medial and collateral ligaments.  The resected tibial bone was removed along with the posterior horns of the menisci.  The PCL was sacrificed.  Patient was found to be extremely tight and additional osteotomies off the tibial plateau had to be made.  A total of approximately 4 additional mm off tibial plateau were made.  The extension gap was measured with a spacer block and alignment and extension was confirmed using a long alignment rod.  The attention was then turned back to the femur. The posterior referencing distal femoral sizing guide was applied to the distal femur.  The femur was sized to be a size 4 narrow. Rotation of the referencing guide was checked with the epicondylar axis and Whitesides line. Then the 4-in-1 cutting jig was then applied to the distal femur. A stylus was used to confirm that the anterior femur would not be notched.   Then the anterior, posterior and chamfer femoral cuts were then made with an oscillating saw.  Large posterior femoral osteophytes were resected during the posterior cuts.  The flexion gap was then measured with a flexion spacer block and long alignment rod and was found to be symmetric with the extension gap and perpendicular to mechanical axis of the tibia.  The distal femoral preparation was completed by performing the posterior stabilized box cut using the cutting block. The entry site for the intramedullary femoral guide was filled with autologous bone graft from bone previously resected earlier in the case.  The proximal tibia plateau was then sized with trial trays. The best coverage was achieved with a size 3. This tibial tray was then pinned into position. The proximal tibia was then prepared with the reamer and keel punch.  After tibial preparation was  completed, all trial components were inserted with polyethylene trials.  The knee was found to have excellent balance and full motion with a size 10 mm tibial polyethylene insert..    The attention was then turned to preparation of the patella. The thickness of the patella was measured with a caliper, the diameter measured with the patella templates.  The patella resection was then made with an oscillating saw using the patella cutting guide.  The final patellar implant diameter was 35 mm.  3 peg holes for the patella component were then drilled. The trial patella was then placed. Knee was taken through a full range of motion and deemed to be stable with the trial components. All trial components were then removed. The knee capsule was then injected with Exparel.  The knee joint capsule was injected with a mixture of quarter percent Marcaine, Toradol and morphine to assist with postoperative pain relief.  The joint was copiously irrigated with pulse lavage.  The final total knee arthroplasty components were then cemented into place with a 10 mm trial polyethylene insert and all excess methylmethacrylate was removed.  The joint was again copiously irrigated. After the cement had hardened the knee was again taken through a full range of motion. It was felt to be most stable with the 10 mm tibial polyethylene insert. The actual tibial polyethylene insert was then placed.   The knee was  taken through a range of motion and the patella tracked well and the knee was again irrigated copiously.    The medial arthrotomy was closed with #1 Ethibond. The subcutaneous tissue closed with 0 and 2-0 vicryl, and skin approximated with staples.  A dry sterile and compressive dressing was applied.  A Polar Care was applied to the operative knee along with a knee immobilizer.  The patient was awakened and brought to the PACU in stable and satisfactory condition.  All sharp, lap and instrument counts were correct at the  conclusion the case. I spoke with the patient's family in the postop consultation room to let them know the case had been performed without complication and the patient was stable in recovery room.

## 2020-12-18 NOTE — Progress Notes (Addendum)
  Subjective:  POST OP CHECK s/p right total knee arthroplasty.   Patient reports right knee pain as mild to moderate.  Patient states she is sitting up in a chair for the second time today.  She is eating dinner.  She denies any shortness of breath, chest pain,  abdominal pain or nausea and vomiting.  Her family members are in the room.  Objective:   VITALS:   Vitals:   12/18/20 1500 12/18/20 1600 12/18/20 1648 12/18/20 1716  BP: (!) 134/91 (!) 141/62 (!) 157/69 (!) 158/61  Pulse: 66 (!) 57 61 63  Resp: 13 14 14 16   Temp:   (!) 97.3 F (36.3 C) 98.1 F (36.7 C)  TempSrc:      SpO2: 100% 97% 99% 99%    PHYSICAL EXAM: Right lower extremity Neurovascular intact Sensation intact distally Intact pulses distally Dorsiflexion/Plantar flexion intact No cellulitis present Compartment soft  LABS  Results for orders placed or performed during the hospital encounter of 12/18/20 (from the past 24 hour(s))  Glucose, capillary     Status: Abnormal   Collection Time: 12/18/20  6:51 AM  Result Value Ref Range   Glucose-Capillary 141 (H) 70 - 99 mg/dL  ABO/Rh     Status: None   Collection Time: 12/18/20  6:59 AM  Result Value Ref Range   ABO/RH(D)      O POS Performed at [facility], [address] [place], [place] [address]   Glucose, capillary     Status: Abnormal   Collection Time: 12/18/20 12:09 PM  Result Value Ref Range   Glucose-Capillary 140 (H) 70 - 99 mg/dL    DG Knee Right Port  Result Date: 12/18/2020 CLINICAL DATA:  Postop knee replacement EXAM: PORTABLE RIGHT KNEE - 1-2 VIEW COMPARISON:  None. FINDINGS: Total knee replacement in satisfactory position alignment. No fracture or complication. Gas and fluid in the knee joint. IMPRESSION: Satisfactory right knee replacement Electronically Signed   By: Franchot Gallo M.D.   On: 12/18/2020 13:49    Assessment/Plan: Day of Surgery   Active Problems:   S/P TKR (total knee replacement) using cement,  right  Patient is doing very well postop.  Her pain is under control currently.  Patient is tolerating p.o. diet.  Patient will have her Foley catheter removed in the morning.  Labs will be checked in the morning.  Patient will continue with physical therapy tomorrow.  She will begin Lovenox for DVT prophylaxis tomorrow.  Continue right lower extremity elevation and Polar Care to reduce swelling.  Patient will continue her SCDs and will begin thigh-high TED hose once her bandage is changed.  Patient is weightbearing as tolerated.  I reviewed the patient's postoperative x-rays which demonstrate the total knee arthroplasty components are well-positioned and there is no evidence of postop complication.    Thornton Park , MD 12/18/2020, 6:03 PM

## 2020-12-18 NOTE — Anesthesia Procedure Notes (Signed)
Spinal  Patient location during procedure: OR Reason for block: surgical anesthesia Staffing Performed: anesthesiologist  Anesthesiologist: Emmie Niemann, MD Resident/CRNA: Rolla Plate, CRNA Preanesthetic Checklist Completed: patient identified, IV checked, site marked, risks and benefits discussed, surgical consent, monitors and equipment checked, pre-op evaluation and timeout performed Spinal Block Patient position: sitting Prep: ChloraPrep and site prepped and draped Patient monitoring: heart rate, continuous pulse ox, blood pressure and cardiac monitor Approach: midline Location: L4-5 Injection technique: single-shot Needle Needle type: Whitacre and Introducer  Needle gauge: 24 G Needle length: 9 cm Assessment Events: CSF return Additional Notes Negative paresthesia. Negative blood return. Positive free-flowing CSF. Expiration date of kit checked and confirmed. Patient tolerated procedure well, without complications.

## 2020-12-18 NOTE — Anesthesia Preprocedure Evaluation (Signed)
Anesthesia Evaluation  Patient identified by MRN, date of birth, ID band Patient awake    Reviewed: Allergy & Precautions, NPO status , Patient's Chart, lab work & pertinent test results  History of Anesthesia Complications Negative for: history of anesthetic complications  Airway Mallampati: II  TM Distance: >3 FB Neck ROM: Full    Dental  (+) Poor Dentition   Pulmonary sleep apnea and Continuous Positive Airway Pressure Ventilation , neg COPD,    breath sounds clear to auscultation- rhonchi (-) wheezing      Cardiovascular hypertension, Pt. on medications (-) angina+ CAD, + Past MI and + Cardiac Stents  (-) CABG  Rhythm:Regular Rate:Normal - Systolic murmurs and - Diastolic murmurs    Neuro/Psych neg Seizures PSYCHIATRIC DISORDERS Depression negative neurological ROS     GI/Hepatic negative GI ROS, Neg liver ROS,   Endo/Other  diabetes, Oral Hypoglycemic AgentsHypothyroidism   Renal/GU negative Renal ROS     Musculoskeletal  (+) Arthritis ,   Abdominal (+) + obese,   Peds  Hematology negative hematology ROS (+)   Anesthesia Other Findings Past Medical History: No date: Coronary artery disease     Comment:  a. 07/[place] STEM[place] ([place] LM nl, LAD 100p (thrombectomy               & 3.5x23 Alpine DES), LCX nl, RCA nl, EF 45%. No date: Depression No date: Diastolic dysfunction     Comment:  a.) TTE 11/26/2020 - LVEF 50-55%; G2DD. No date: Endometrial cancer ([place]) No date: Gravida 3 para 3 No date: Hyperlipidemia No date: Hypertension No date: Hypertensive heart disease No date: Hypothyroid No date: Ischemic cardiomyopathy     Comment:  a.) 07/2013 EF 45% @ time of STEMI; b.) 11/2013 Echo: EF               55-60%, mildly dil LA, nl RV fxn. c.) TTE 07/29/2015 -               LVEF 55-60%, mild LA and RV dilation. d.) TTE 11/26/2020               - LVEF 50-55%; G2DD. No date: Menopause No date: Obstructive sleep  apnea on CPAP No date: Osteoarthritis 08/14/2013: ST elevation myocardial infarction (STEMI) of  anterolateral wall ([facility])     Comment:  a.) LVEF reduced at 45%; Tx'd with trombectomy and PCI               to 100% LAD; 3.5 x 23 mm Xience Alpine DES placed. No date: T2DM (type 2 diabetes mellitus) ([place]) No date: Thyroid cancer ([place])     Comment:  a.) s/p total thyroidectomy. b.) on daily levothyroxine.   Reproductive/Obstetrics                             Lab Results  Component Value Date   WBC 7.3 12/08/2020   HGB 11.6 (L) 12/08/2020   HCT 35.4 (L) 12/08/2020   MCV 98.1 12/08/2020   PLT 208 12/08/2020    Anesthesia Physical Anesthesia Plan  ASA: 3  Anesthesia Plan: Spinal   Post-op Pain Management:    Induction:   PONV Risk Score and Plan: 2 and Propofol infusion  Airway Management Planned: Natural Airway  Additional Equipment:   Intra-op Plan:   Post-operative Plan:   Informed Consent: I have reviewed the patients History and Physical, chart, labs and discussed the procedure including the risks, benefits and alternatives  for the proposed anesthesia with the patient or authorized representative who has indicated his/her understanding and acceptance.     Dental advisory given  Plan Discussed with: CRNA and Anesthesiologist  Anesthesia Plan Comments:         Anesthesia Quick Evaluation

## 2020-12-19 ENCOUNTER — Encounter: Payer: Self-pay | Admitting: Orthopedic Surgery

## 2020-12-19 DIAGNOSIS — I10 Essential (primary) hypertension: Secondary | ICD-10-CM | POA: Diagnosis not present

## 2020-12-19 DIAGNOSIS — Z85850 Personal history of malignant neoplasm of thyroid: Secondary | ICD-10-CM | POA: Diagnosis not present

## 2020-12-19 DIAGNOSIS — E039 Hypothyroidism, unspecified: Secondary | ICD-10-CM | POA: Diagnosis not present

## 2020-12-19 DIAGNOSIS — Z23 Encounter for immunization: Secondary | ICD-10-CM | POA: Diagnosis not present

## 2020-12-19 DIAGNOSIS — Z79899 Other long term (current) drug therapy: Secondary | ICD-10-CM | POA: Diagnosis not present

## 2020-12-19 DIAGNOSIS — R531 Weakness: Secondary | ICD-10-CM | POA: Diagnosis not present

## 2020-12-19 DIAGNOSIS — Z955 Presence of coronary angioplasty implant and graft: Secondary | ICD-10-CM | POA: Diagnosis not present

## 2020-12-19 DIAGNOSIS — M1711 Unilateral primary osteoarthritis, right knee: Secondary | ICD-10-CM | POA: Diagnosis not present

## 2020-12-19 DIAGNOSIS — Z7982 Long term (current) use of aspirin: Secondary | ICD-10-CM | POA: Diagnosis not present

## 2020-12-19 DIAGNOSIS — E119 Type 2 diabetes mellitus without complications: Secondary | ICD-10-CM | POA: Diagnosis not present

## 2020-12-19 DIAGNOSIS — I2510 Atherosclerotic heart disease of native coronary artery without angina pectoris: Secondary | ICD-10-CM | POA: Diagnosis not present

## 2020-12-19 DIAGNOSIS — Z7984 Long term (current) use of oral hypoglycemic drugs: Secondary | ICD-10-CM | POA: Diagnosis not present

## 2020-12-19 DIAGNOSIS — Z96651 Presence of right artificial knee joint: Secondary | ICD-10-CM | POA: Diagnosis not present

## 2020-12-19 LAB — CBC
HCT: 30.3 % — ABNORMAL LOW (ref 36.0–46.0)
Hemoglobin: 10.1 g/dL — ABNORMAL LOW (ref 12.0–15.0)
MCH: 32.3 pg (ref 26.0–34.0)
MCHC: 33.3 g/dL (ref 30.0–36.0)
MCV: 96.8 fL (ref 80.0–100.0)
Platelets: 173 10*3/uL (ref 150–400)
RBC: 3.13 MIL/uL — ABNORMAL LOW (ref 3.87–5.11)
RDW: 11.2 % — ABNORMAL LOW (ref 11.5–15.5)
WBC: 7.6 10*3/uL (ref 4.0–10.5)
nRBC: 0 % (ref 0.0–0.2)

## 2020-12-19 LAB — BASIC METABOLIC PANEL
Anion gap: 4 — ABNORMAL LOW (ref 5–15)
BUN: 20 mg/dL (ref 8–23)
CO2: 24 mmol/L (ref 22–32)
Calcium: 8.1 mg/dL — ABNORMAL LOW (ref 8.9–10.3)
Chloride: 103 mmol/L (ref 98–111)
Creatinine, Ser: 0.94 mg/dL (ref 0.44–1.00)
GFR, Estimated: >60 mL/min (ref >60)
Glucose, Bld: 162 mg/dL — ABNORMAL HIGH (ref 70–99)
Potassium: 4.3 mmol/L (ref 3.5–5.1)
Sodium: 131 mmol/L — ABNORMAL LOW (ref 135–145)

## 2020-12-19 LAB — GLUCOSE, CAPILLARY: Glucose-Capillary: 152 mg/dL — ABNORMAL HIGH (ref 70–99)

## 2020-12-19 MED ORDER — METHOCARBAMOL 500 MG PO TABS: 500.0000 mg | ORAL_TABLET | Freq: Three times a day (TID) | ORAL | 0 refills | Status: DC | PRN

## 2020-12-19 MED ORDER — ONDANSETRON HCL 4 MG PO TABS: 4.0000 mg | ORAL_TABLET | Freq: Four times a day (QID) | ORAL | 0 refills | Status: DC | PRN

## 2020-12-19 MED ORDER — DOCUSATE SODIUM 100 MG PO CAPS: 100.0000 mg | ORAL_CAPSULE | Freq: Two times a day (BID) | ORAL | 0 refills | Status: DC

## 2020-12-19 MED ORDER — OXYCODONE HCL 5 MG PO TABS: 5.0000 mg | ORAL_TABLET | ORAL | 0 refills | Status: DC | PRN

## 2020-12-19 MED ORDER — BISACODYL 5 MG PO TBEC: 10.0000 mg | DELAYED_RELEASE_TABLET | Freq: Every day | ORAL | 0 refills | Status: DC | PRN

## 2020-12-19 MED ORDER — ASPIRIN EC 325 MG PO TBEC: 325.0000 mg | DELAYED_RELEASE_TABLET | Freq: Two times a day (BID) | ORAL | 0 refills | Status: DC

## 2020-12-19 NOTE — Discharge Summary (Signed)
Physician Discharge Summary  Patient ID: [name] MRN: [id number] DOB/AGE: 09/16/1945 75 y.o.  Admit date: 12/18/2020 Discharge date: 12/19/2020  Admission Diagnoses:  Right Knee Osteoarthritis <principal problem not specified>  Discharge Diagnoses:  Right Knee Osteoarthritis Active Problems:   S/P TKR (total knee replacement) using cement, right   Past Medical History:  Diagnosis Date   Coronary artery disease    a. 07/[place] ([place] LM nl, LAD 100p (thrombectomy & 3.5x23 Alpine DES), LCX nl, RCA nl, EF 45%.   Depression    Diastolic dysfunction    a.) TTE 11/26/2020 - LVEF 50-55%; G2DD.   Endometrial cancer ([place])    Gravida 3 para 3    Hyperlipidemia    Hypertension    Hypertensive heart disease    Hypothyroid    Ischemic cardiomyopathy    a.) 07/2013 EF 45% @ time of STEMI; b.) 11/2013 Echo: EF 55-60%, mildly dil LA, nl RV fxn. c.) TTE 07/29/2015 - LVEF 55-60%, mild LA and RV dilation. d.) TTE 11/26/2020 - LVEF 50-55%; G2DD.   Menopause    Obstructive sleep apnea on CPAP    Osteoarthritis    ST elevation myocardial infarction (STEMI) of anterolateral wall ([place]) 08/14/2013   a.) LVEF reduced at 45%; Tx'd with trombectomy and PCI to 100% LAD; 3.5 x 23 mm Xience Alpine DES placed.   T2DM (type 2 diabetes mellitus) ([place])    Thyroid cancer ([place])    a.) s/p total thyroidectomy. b.) on daily levothyroxine.    Surgeries: Procedure(s): RIGHT TOTAL KNEE ARTHROPLASTY on 12/18/2020   Consultants (if any):   Discharged Condition: Improved  Hospital Course: [name] is an 75 y.o. female who was admitted 12/18/2020 with a diagnosis of  Right Knee Osteoarthritis and went to the operating room on 12/18/2020 and underwent an uncomplicated right total knee arthroplasty.    She was given perioperative antibiotics:  Anti-infectives (From admission, onward)    Start     Dose/Rate Route Frequency Ordered Stop   12/18/20 1400  ceFAZolin (ANCEF) IVPB 2g/100 mL  premix        2 g 200 mL/hr over 30 Minutes Intravenous Every 6 hours 12/18/20 1303 12/18/20 2140   12/18/20 0704  ceFAZolin (ANCEF) 2-4 GM/100ML-% IVPB       Note to Pharmacy: Sylvester Harder   : cabinet override      12/18/20 0704 12/18/20 0827   12/18/20 0703  clindamycin (CLEOCIN) 600 MG/50ML IVPB       Note to Pharmacy: Sylvester Harder   : cabinet override      12/18/20 0703 12/18/20 0846   12/18/20 0600  ceFAZolin (ANCEF) IVPB 2g/100 mL premix        2 g 200 mL/hr over 30 Minutes Intravenous On call to O.R. 12/18/20 0041 12/18/20 0856   12/18/20 0045  clindamycin (CLEOCIN) IVPB 600 mg        600 mg 100 mL/hr over 30 Minutes Intravenous  Once 12/18/20 0041 12/18/20 0856     .  She was given sequential compression devices, early ambulation, and Lovenox for DVT prophylaxis.  Patient did well postop.  Her pain is well controlled.  Her hemoglobin hematocrit were stable.  Patient made excellent progress of physical therapy.  Her pain was controlled and given her clinical improvement she was prepared for discharge home on postop day #1.  She benefited maximally from the hospital stay and there were no complications.    Recent vital signs:  Vitals:   12/19/20 0748  12/19/20 1146  BP: (!) 133/57 129/80  Pulse: 66 70  Resp: 18 18  Temp: 98.4 F (36.9 C) 98 F (36.7 C)  SpO2: 100% 94%    Recent laboratory studies:  Lab Results  Component Value Date   HGB 10.1 (L) 12/19/2020   HGB 11.6 (L) 12/08/2020   HGB 10.9 (L) 06/24/2020   Lab Results  Component Value Date   WBC 7.6 12/19/2020   PLT 173 12/19/2020   Lab Results  Component Value Date   INR 1.1 12/08/2020   Lab Results  Component Value Date   NA 131 (L) 12/19/2020   K 4.3 12/19/2020   CL 103 12/19/2020   CO2 24 12/19/2020   BUN 20 12/19/2020   CREATININE 0.94 12/19/2020   GLUCOSE 162 (H) 12/19/2020    Discharge Medications:   Allergies as of 12/19/2020   No Known Allergies      Medication List     STOP  taking these medications    aspirin 81 MG tablet Replaced by: aspirin EC 325 MG tablet       TAKE these medications    Accu-Chek Aviva Plus test strip Generic drug: glucose blood U UTD BID   acetaminophen 500 MG tablet Commonly known as: TYLENOL Take 1,000 mg by mouth every 6 (six) hours as needed for moderate pain.   amLODipine 2.5 MG tablet Commonly known as: NORVASC Take 1 tablet (2.5 mg total) by mouth daily.   aspirin EC 325 MG tablet Take 1 tablet (325 mg total) by mouth 2 (two) times daily. Replaces: aspirin 81 MG tablet   bisacodyl 5 MG EC tablet Commonly known as: DULCOLAX Take 2 tablets (10 mg total) by mouth daily as needed for moderate constipation.   carvedilol 3.125 MG tablet Commonly known as: COREG Take 1 tablet (3.125 mg total) by mouth 2 (two) times daily. What changed: when to take this   docusate sodium 100 MG capsule Commonly known as: COLACE Take 1 capsule (100 mg total) by mouth 2 (two) times daily.   ezetimibe 10 MG tablet Commonly known as: ZETIA Take 1 tablet (10 mg total) by mouth daily.   FLUoxetine 10 MG capsule Commonly known as: PROZAC Take 1 capsule (10 mg total) by mouth daily.   levothyroxine 125 MCG tablet Commonly known as: SYNTHROID TAKE 1 TABLET BY MOUTH  DAILY BEFORE BREAKFAST   losartan 100 MG tablet Commonly known as: COZAAR TAKE 1 TABLET BY MOUTH  DAILY   metFORMIN 500 MG 24 hr tablet Commonly known as: GLUCOPHAGE-XR Take 3 tablets (1,500 mg total) by mouth daily with breakfast.   methocarbamol 500 MG tablet Commonly known as: ROBAXIN Take 1 tablet (500 mg total) by mouth every 8 (eight) hours as needed for muscle spasms.   nitroGLYCERIN 0.4 MG SL tablet Commonly known as: NITROSTAT Place 1 tablet (0.4 mg total) under the tongue every 5 (five) minutes as needed for chest pain.   ondansetron 4 MG tablet Commonly known as: ZOFRAN Take 1 tablet (4 mg total) by mouth every 6 (six) hours as needed for nausea.    oxyCODONE 5 MG immediate release tablet Commonly known as: Oxy IR/ROXICODONE Take 1-2 tablets (5-10 mg total) by mouth every 4 (four) hours as needed for moderate pain (pain score 4-6).   Ozempic (0.25 or 0.5 MG/DOSE) 2 MG/1.5ML Sopn Generic drug: Semaglutide(0.25 or 0.5MG /DOS) INJECT 0.5MG  SUBCUTANEOUSLY ONCE WEEKLY AS DIRECTED   rosuvastatin 20 MG tablet Commonly known as: CRESTOR Take 1 tablet (20 mg total)  by mouth daily.        Diagnostic Studies: DG Knee Right Port  Result Date: 12/18/2020 CLINICAL DATA:  Postop knee replacement EXAM: PORTABLE RIGHT KNEE - 1-2 VIEW COMPARISON:  None. FINDINGS: Total knee replacement in satisfactory position alignment. No fracture or complication. Gas and fluid in the knee joint. IMPRESSION: Satisfactory right knee replacement Electronically Signed   By: Franchot Gallo M.D.   On: 12/18/2020 13:49   ECHOCARDIOGRAM COMPLETE  Result Date: 11/26/2020    ECHOCARDIOGRAM REPORT   Patient Name:   [name] Date of Exam: 11/26/2020 Medical Rec #:  [id number]         Height:       67.0 in Accession #:    [id number]        Weight:       225.0 lb Date of Birth:  12-06-1945          BSA:          2.126 m Patient Age:    22 years          BP:           124/72 mmHg Patient Gender: F                 HR:           52 bpm. Exam Location:  [place] Procedure: 2D Echo, 3D Echo, Cardiac Doppler, Color Doppler and Strain Analysis Indications:    I42.9 Cardiomyopathy (unspecified)  History:        Patient has prior history of Echocardiogram examinations, most                 recent 12/14/2013. Cardiomyopathy, CAD and Previous Myocardial                 Infarction; Risk Factors:Hypertension, Diabetes, Dyslipidemia                 and Sleep Apnea. Pre-op assessment.  Sonographer:    Pilar Jarvis RDMS, RVT, RDCS Referring Phys: [id number] [place]  1. Left ventricular ejection fraction, by estimation, is 50 to 55%. The left ventricle has low normal  function. The left ventricle has no regional wall motion abnormalities. Left ventricular diastolic parameters are consistent with Grade II diastolic dysfunction (pseudonormalization).  2. Right ventricular systolic function is normal. The right ventricular size is normal.  3. The mitral valve is normal in structure. No evidence of mitral valve regurgitation.  4. The aortic valve is grossly normal. Aortic valve regurgitation is not visualized. FINDINGS  Left Ventricle: Left ventricular ejection fraction, by estimation, is 50 to 55%. The left ventricle has low normal function. The left ventricle has no regional wall motion abnormalities. Global longitudinal strain performed but not reported based on interpreter judgement due to suboptimal tracking. 3D left ventricular ejection fraction analysis performed but not reported based on interpreter judgement due to suboptimal quality. The left ventricular internal cavity size was normal in size. There is no left ventricular hypertrophy. Left ventricular diastolic parameters are consistent with Grade II diastolic dysfunction (pseudonormalization). Right Ventricle: The right ventricular size is normal. No increase in right ventricular wall thickness. Right ventricular systolic function is normal. Left Atrium: Left atrial size was normal in size. Right Atrium: Right atrial size was normal in size. Pericardium: There is no evidence of pericardial effusion. Mitral Valve: The mitral valve is normal in structure. No evidence of mitral valve regurgitation. Tricuspid Valve: The tricuspid valve is normal in structure. Tricuspid  valve regurgitation is trivial. Aortic Valve: The aortic valve is grossly normal. Aortic valve regurgitation is not visualized. Aortic valve mean gradient measures 4.0 mmHg. Aortic valve peak gradient measures 6.2 mmHg. Aortic valve area, by VTI measures 3.33 cm. Pulmonic Valve: The pulmonic valve was not well visualized. Pulmonic valve regurgitation is not  visualized. Aorta: The aortic root is normal in size and structure. Venous: The inferior vena cava was not well visualized. IAS/Shunts: No atrial level shunt detected by color flow Doppler.  LEFT VENTRICLE PLAX 2D LVIDd:         5.30 cm      Diastology LVIDs:         3.80 cm      LV e' medial:    5.11 cm/s LV PW:         0.90 cm      LV E/e' medial:  17.5 LV IVS:        0.90 cm      LV e' lateral:   3.48 cm/s LVOT diam:     2.20 cm      LV E/e' lateral: 25.7 LV SV:         114 LV SV Index:   53 LVOT Area:     3.80 cm  LV Volumes (MOD) LV vol d, MOD A2C: 156.0 ml LV vol d, MOD A4C: 115.0 ml LV vol s, MOD A2C: 60.8 ml LV vol s, MOD A4C: 53.6 ml LV SV MOD A2C:     95.2 ml LV SV MOD A4C:     115.0 ml LV SV MOD BP:      84.2 ml RIGHT VENTRICLE             IVC RV Basal diam:  3.70 cm     IVC diam: 2.10 cm RV S prime:     16.90 cm/s TAPSE (M-mode): 2.4 cm LEFT ATRIUM             Index       RIGHT ATRIUM           Index LA diam:        4.40 cm 2.07 cm/m  RA Area:     17.60 cm LA Vol (A2C):   78.7 ml 37.02 ml/m RA Volume:   54.60 ml  25.68 ml/m LA Vol (A4C):   61.5 ml 28.93 ml/m LA Biplane Vol: 69.5 ml 32.69 ml/m  AORTIC VALVE                   PULMONIC VALVE AV Area (Vmax):    3.25 cm    PV Vmax:       0.75 m/s AV Area (Vmean):   3.14 cm    PV Peak grad:  2.2 mmHg AV Area (VTI):     3.33 cm AV Vmax:           124.00 cm/s AV Vmean:          90.000 cm/s AV VTI:            0.341 m AV Peak Grad:      6.2 mmHg AV Mean Grad:      4.0 mmHg LVOT Vmax:         106.00 cm/s LVOT Vmean:        74.300 cm/s LVOT VTI:          0.299 m LVOT/AV VTI ratio: 0.88  AORTA Ao Root diam: 3.10 cm Ao Asc diam:  3.20 cm Ao Arch diam:  2.6 cm MITRAL VALVE               TRICUSPID VALVE MV Area (PHT): 3.63 cm    TR Peak grad:   32.7 mmHg MV Decel Time: 209 msec    TR Vmax:        286.00 cm/s MV E velocity: 89.50 cm/s MV A velocity: 99.00 cm/s  SHUNTS MV E/A ratio:  0.90        Systemic VTI:  0.30 m                            Systemic Diam:  2.20 cm Kate Sable MD Electronically signed by Kate Sable MD Signature Date/Time: 11/26/2020/6:25:15 PM    Final     Disposition: Discharge disposition: 01-Home or Self Care       Discharge Instructions     Call MD / Call 911   Complete by: As directed    If you experience chest pain or shortness of breath, CALL 911 and be transported to the hospital emergency room.  If you develope a fever above 101 F, pus (white drainage) or increased drainage or redness at the wound, or calf pain, call your surgeon's office.   Constipation Prevention   Complete by: As directed    Drink plenty of fluids.  Prune juice may be helpful.  You may use a stool softener, such as Colace (over the counter) 100 mg twice a day.  Use MiraLax (over the counter) for constipation as needed.   Diet general   Complete by: As directed    Discharge instructions   Complete by: As directed    The patient may continue to bear weight on the right lower extremity with use of a walker. The patient should continue to use TED stockings until follow-up. Patient should remove the TED stockings at night for sleep. The patient needs to continue to elevate the right lower extremity whenever possible. The knee immobilizer should be used at night. The patient may remove the knee immobilizer to perform exercises or sit in a chair during the day.  Patient should not place a pillow under their knee. The Polar Care may be used by the patient for comfort.  The dressing should remain on until follow up in the office.   The patient must cover the right knee dressing/incision during showers with a plastic bag or Saran wrap.  The patient will take aspirin 325 mg by mouth twice a day for blood clot prevention and continue to work on knee range of motion exercises at home as instructed by physical therapy until follow-up in the office.   Driving restrictions   Complete by: As directed    No driving for 6-8 weeks   Increase activity slowly  as tolerated   Complete by: As directed    Lifting restrictions   Complete by: As directed    No lifting for 12-16 weeks   Post-operative opioid taper instructions:   Complete by: As directed    POST-OPERATIVE OPIOID TAPER INSTRUCTIONS: It is important to wean off of your opioid medication as soon as possible. If you do not need pain medication after your surgery it is ok to stop day one. Opioids include: Codeine, Hydrocodone(Norco, Vicodin), Oxycodone(Percocet, oxycontin) and hydromorphone amongst others.  Long term and even short term use of opiods can cause: Increased pain response Dependence Constipation Depression Respiratory depression And more.  Withdrawal symptoms can include Flu like symptoms Nausea,  vomiting And more Techniques to manage these symptoms Hydrate well Eat regular healthy meals Stay active Use relaxation techniques(deep breathing, meditating, yoga) Do Not substitute Alcohol to help with tapering If you have been on opioids for less than two weeks and do not have pain than it is ok to stop all together.  Plan to wean off of opioids This plan should start within one week post op of your joint replacement. Maintain the same interval or time between taking each dose and first decrease the dose.  Cut the total daily intake of opioids by one tablet each day Next start to increase the time between doses. The last dose that should be eliminated is the evening dose.             Signed: Thornton Park ,MD 12/19/2020, 2:02 PM

## 2020-12-19 NOTE — Progress Notes (Addendum)
Subjective:  POD #1 s/p right total knee arthroplasty.   Patient reports right knee pain as mild to moderate.  Patient was seen in her room with the physical therapist, her nurse and family member.  Physical therapist reported the patient did well walking in the hallway and with stairs.  Patient's IV has infiltrated and is being removed by her nurse.  The patient explains that she had some nausea overnight which she believes may be related to the Robaxin but also feels this medication is very helpful.  She also had some nausea well performing the stairs with physical therapy but this dissipated quickly.  She has not vomited.  Objective:   VITALS:   Vitals:   12/18/20 2351 12/19/20 0441 12/19/20 0748 12/19/20 1146  BP: (!) 137/57 117/61 (!) 133/57 129/80  Pulse: 60 64 66 70  Resp: 17 16 18 18   Temp: 98.2 F (36.8 C) 98.1 F (36.7 C) 98.4 F (36.9 C) 98 F (36.7 C)  TempSrc:      SpO2: 99% 100% 100% 94%  Weight:      Height:        PHYSICAL EXAM: Right lower extremity: I personally removed the patient's outer dressings today.  Her Aquacel dressing had minimal drainage on it.  Patient has no erythema or ecchymosis.  She has minimal knee swelling. Neurovascular intact Sensation intact distally Intact pulses distally Dorsiflexion/Plantar flexion intact Incision: scant drainage No cellulitis present Compartment soft  LABS  Results for orders placed or performed during the hospital encounter of 12/18/20 (from the past 24 hour(s))  Glucose, capillary     Status: Abnormal   Collection Time: 12/18/20  8:05 PM  Result Value Ref Range   Glucose-Capillary 186 (H) 70 - 99 mg/dL   Comment 1 Notify RN    Comment 2 Document in Chart   CBC     Status: Abnormal   Collection Time: 12/19/20  3:57 AM  Result Value Ref Range   WBC 7.6 4.0 - 10.5 K/uL   RBC 3.13 (L) 3.87 - 5.11 MIL/uL   Hemoglobin 10.1 (L) 12.0 - 15.0 g/dL   HCT 30.3 (L) 36.0 - 46.0 %   MCV 96.8 80.0 - 100.0 fL   MCH  32.3 26.0 - 34.0 pg   MCHC 33.3 30.0 - 36.0 g/dL   RDW 11.2 (L) 11.5 - 15.5 %   Platelets 173 150 - 400 K/uL   nRBC 0.0 0.0 - 0.2 %  Basic metabolic panel     Status: Abnormal   Collection Time: 12/19/20  3:57 AM  Result Value Ref Range   Sodium 131 (L) 135 - 145 mmol/L   Potassium 4.3 3.5 - 5.1 mmol/L   Chloride 103 98 - 111 mmol/L   CO2 24 22 - 32 mmol/L   Glucose, Bld 162 (H) 70 - 99 mg/dL   BUN 20 8 - 23 mg/dL   Creatinine, Ser 0.94 0.44 - 1.00 mg/dL   Calcium 8.1 (L) 8.9 - 10.3 mg/dL   GFR, Estimated >60 >60 mL/min   Anion gap 4 (L) 5 - 15  Glucose, capillary     Status: Abnormal   Collection Time: 12/19/20 11:48 AM  Result Value Ref Range   Glucose-Capillary 152 (H) 70 - 99 mg/dL    DG Knee Right Port  Result Date: 12/18/2020 CLINICAL DATA:  Postop knee replacement EXAM: PORTABLE RIGHT KNEE - 1-2 VIEW COMPARISON:  None. FINDINGS: Total knee replacement in satisfactory position alignment. No fracture or complication. Gas and  fluid in the knee joint. IMPRESSION: Satisfactory right knee replacement Electronically Signed   By: Franchot Gallo M.D.   On: 12/18/2020 13:49    Assessment/Plan: 1 Day Post-Op   Active Problems:   S/P TKR (total knee replacement) using cement, right  Patient is doing very well postop.  She is making excellent progress of physical therapy.  She is ready for discharge home.  She has a an outpatient physical therapy appointment on Tuesday.  In the meantime she will continue working on her home exercises as she has been taught here by her therapist at [facility].  Patient would prefer to be on enteric-coated aspirin 325 mg p.o. twice daily for DVT prophylaxis.  Her family member, who is a Marine scientist, will be staying with her for the next 1 to 2 weeks.  Patient Foley catheter has been removed.  Her hemoglobin and hematocrit were stable today.  Her vital signs are also stable.    Thornton Park , MD 12/19/2020, 1:53 PM

## 2020-12-19 NOTE — Plan of Care (Signed)

## 2020-12-19 NOTE — Plan of Care (Signed)
  Problem: Education: Goal: Knowledge of General Education information will improve Description: Including pain rating scale, medication(s)/side effects and non-pharmacologic comfort measures 12/19/2020 1513 by Elsie Ra, RN Outcome: Completed/Met 12/19/2020 1500 by Elsie Ra, RN Outcome: Progressing   Problem: Health Behavior/Discharge Planning: Goal: Ability to manage health-related needs will improve 12/19/2020 1513 by Tekeisha Hakim, Debbe Mounts, RN Outcome: Completed/Met 12/19/2020 1500 by Elsie Ra, RN Outcome: Progressing   Problem: Clinical Measurements: Goal: Ability to maintain clinical measurements within normal limits will improve 12/19/2020 1513 by Viann Nielson, Debbe Mounts, RN Outcome: Completed/Met 12/19/2020 1500 by Elsie Ra, RN Outcome: Progressing Goal: Will remain free from infection 12/19/2020 1513 by Edyn Popoca, Debbe Mounts, RN Outcome: Completed/Met 12/19/2020 1500 by Elsie Ra, RN Outcome: Progressing Goal: Diagnostic test results will improve 12/19/2020 1513 by Jakaden Ouzts, Debbe Mounts, RN Outcome: Completed/Met 12/19/2020 1500 by Elsie Ra, RN Outcome: Progressing Goal: Respiratory complications will improve 12/19/2020 1513 by Elsie Ra, RN Outcome: Completed/Met 12/19/2020 1500 by Elsie Ra, RN Outcome: Progressing Goal: Cardiovascular complication will be avoided 12/19/2020 1513 by Elsie Ra, RN Outcome: Completed/Met 12/19/2020 1500 by Elsie Ra, RN Outcome: Progressing   Problem: Activity: Goal: Risk for activity intolerance will decrease 12/19/2020 1513 by Cyndi Montejano, Debbe Mounts, RN Outcome: Completed/Met 12/19/2020 1500 by Elsie Ra, RN Outcome: Progressing   Problem: Nutrition: Goal: Adequate nutrition will be maintained 12/19/2020 1513 by Elsie Ra, RN Outcome: Completed/Met 12/19/2020 1500 by Elsie Ra, RN Outcome: Progressing   Problem:  Coping: Goal: Level of anxiety will decrease 12/19/2020 1513 by Elsie Ra, RN Outcome: Completed/Met 12/19/2020 1500 by Elsie Ra, RN Outcome: Progressing   Problem: Elimination: Goal: Will not experience complications related to bowel motility 12/19/2020 1513 by Elsie Ra, RN Outcome: Completed/Met 12/19/2020 1500 by Elsie Ra, RN Outcome: Progressing Goal: Will not experience complications related to urinary retention 12/19/2020 1513 by Jalayiah Bibian, Debbe Mounts, RN Outcome: Completed/Met 12/19/2020 1500 by Elsie Ra, RN Outcome: Progressing   Problem: Pain Managment: Goal: General experience of comfort will improve 12/19/2020 1513 by Rogelio Winbush, Debbe Mounts, RN Outcome: Completed/Met 12/19/2020 1500 by Elsie Ra, RN Outcome: Progressing   Problem: Safety: Goal: Ability to remain free from injury will improve 12/19/2020 1513 by York Valliant, Debbe Mounts, RN Outcome: Completed/Met 12/19/2020 1500 by Elsie Ra, RN Outcome: Progressing   Problem: Skin Integrity: Goal: Risk for impaired skin integrity will decrease 12/19/2020 1513 by Zidan Helget, Debbe Mounts, RN Outcome: Completed/Met 12/19/2020 1500 by Elsie Ra, RN Outcome: Progressing   Problem: Education: Goal: Required Educational Video(s) 12/19/2020 1513 by Elsie Ra, RN Outcome: Completed/Met 12/19/2020 1500 by Elsie Ra, RN Outcome: Progressing   Problem: Clinical Measurements: Goal: Postoperative complications will be avoided or minimized 12/19/2020 1513 by Armya Westerhoff, Debbe Mounts, RN Outcome: Completed/Met 12/19/2020 1500 by Elsie Ra, RN Outcome: Progressing   Problem: Skin Integrity: Goal: Demonstration of wound healing without infection will improve 12/19/2020 1513 by Elsie Ra, RN Outcome: Completed/Met 12/19/2020 1500 by Elsie Ra, RN Outcome: Progressing

## 2020-12-19 NOTE — Evaluation (Signed)
Occupational Therapy Evaluation Patient Details Name: [name] MRN: [id number] DOB: Jul 05, 1945 Today's Date: 12/19/2020   History of Present Illness Pt is a 75 y.o. female s/p R TKA secondary OA 12/18/20.  PMH includes sleep apnea with CPAP, DM, htn, h/o MI, CAD, cardiac stents, endometrial CA, Gravida 3 para 3, and thyroid CA (s/p thyroidectomy).   Clinical Impression   Pt seen for OT evaluation this date, POD#1 from above surgery. Pt was independent in all ADL prior to surgery and is eager to return to PLOF with less pain and improved safety and independence. Pt currently requires minimal assist for LB dressing and bathing while in seated position due to pain and limited AROM of R knee. Pt instructed in polar care mgt, falls prevention strategies, knee immobilizer mgt, home/routines modifications, DME/AE for LB bathing and dressing tasks, and compression stocking mgt. Handout provided to support recall and carryover. Pt verbalized understanding. No additional OT concerns. Do not currently anticipate any additional skilled OT needs following this hospitalization. Recommend BSC, TOC notified.     Recommendations for follow up therapy are one component of a multi-disciplinary discharge planning process, led by the attending physician.  Recommendations may be updated based on patient status, additional functional criteria and insurance authorization.   Follow Up Recommendations  No OT follow up    Equipment Recommendations  3 in 1 bedside commode    Recommendations for Other Services       Precautions / Restrictions Precautions Precautions: Knee;Fall Precaution Booklet Issued: Yes (comment) Required Braces or Orthoses: Knee Immobilizer - Right Restrictions Weight Bearing Restrictions: Yes RLE Weight Bearing: Weight bearing as tolerated      Mobility Bed Mobility               General bed mobility comments: NT, up in recliner    Transfers Overall transfer level:  Needs assistance Equipment used: Rolling walker (2 wheeled) Transfers: Sit to/from Stand Sit to Stand: Min guard              Balance Overall balance assessment: Needs assistance Sitting-balance support: No upper extremity supported;Feet supported Sitting balance-Leahy Scale: Good     Standing balance support: Single extremity supported;Bilateral upper extremity supported Standing balance-Leahy Scale: Fair                             ADL either performed or assessed with clinical judgement   ADL Overall ADL's : Needs assistance/impaired                                       General ADL Comments: Pt requires MIN A for LB ADL, CGA for ADL transfers, and MOD-MAX A for compression stockings, polar care, and KI mgt. Pt's cousin who is an Therapist, sports will be able to assist.     Vision         Perception     Praxis      Pertinent Vitals/Pain Pain Assessment: 0-10 Pain Score: 4  Pain Location: R knee, hamstring Pain Descriptors / Indicators: Aching Pain Intervention(s): Limited activity within patient's tolerance;Monitored during session;Premedicated before session;Repositioned;Ice applied     Hand Dominance     Extremity/Trunk Assessment Upper Extremity Assessment Upper Extremity Assessment: Overall WFL for tasks assessed   Lower Extremity Assessment Lower Extremity Assessment: RLE deficits/detail RLE Deficits / Details: s/p TKA  Communication Communication Communication: No difficulties   Cognition Arousal/Alertness: Awake/alert Behavior During Therapy: WFL for tasks assessed/performed Overall Cognitive Status: Within Functional Limits for tasks assessed                                     General Comments       Exercises Other Exercises Other Exercises: Pt instructed in polar care mgt, falls prevention, TED hose mgt, KI mgt (doffed and donned for improved positioning), AE/DME, and home/routines modifications;  handout provided   Shoulder Instructions      Home Living Family/patient expects to be discharged to:: Private residence Living Arrangements: Alone Available Help at Discharge: Family Type of Home: House Home Access: Stairs to enter Technical brewer of Steps: 3 Entrance Stairs-Rails: Left Home Layout: One level     Bathroom Shower/Tub: Occupational psychologist: Handicapped height     [place]: Shower seat - built in;Grab bars - toilet;Grab bars - tub/shower          Prior Functioning/Environment Level of Independence: Independent                 OT Problem List: Decreased strength;Decreased range of motion;Pain;Impaired balance (sitting and/or standing)      OT Treatment/Interventions:      OT Goals(Current goals can be found in the care plan section) Acute Rehab OT Goals Patient Stated Goal: home today OT Goal Formulation: All assessment and education complete, DC therapy  OT Frequency:     Barriers to D/C:            Co-evaluation              AM-PAC OT "6 Clicks" Daily Activity     Outcome Measure Help from another person eating meals?: None Help from another person taking care of personal grooming?: None Help from another person toileting, which includes using toliet, bedpan, or urinal?: A Little Help from another person bathing (including washing, rinsing, drying)?: A Little Help from another person to put on and taking off regular upper body clothing?: None Help from another person to put on and taking off regular lower body clothing?: A Little 6 Click Score: 21   End of Session    Activity Tolerance: Patient tolerated treatment well Patient left: in chair;with call bell/phone within reach;Other (comment) (KI, polar care in place)  OT Visit Diagnosis: Other abnormalities of gait and mobility (R26.89);Pain Pain - Right/Left: Right Pain - part of body: Knee                Time: 0254-2706 OT Time Calculation (min): 38  min Charges:  OT General Charges $OT Visit: 1 Visit OT Evaluation $OT Eval Low Complexity: 1 Low OT Treatments $Self Care/Home Management : 23-37 mins  Ardeth Perfect., MPH, MS, OTR/L ascom [phone] 12/19/20, 10:45 AM

## 2020-12-19 NOTE — Plan of Care (Signed)
Patient sleeping between care. Aox4.Current pain regimen effective. One episode of nausea/vomiting overnight. Knee immobilizer on. No new changes in assessment. Call bell within reach.   PLAN OF CARE ONGOING Problem: Education: Goal: Knowledge of General Education information will improve Description: Including pain rating scale, medication(s)/side effects and non-pharmacologic comfort measures Outcome: Progressing   Problem: Health Behavior/Discharge Planning: Goal: Ability to manage health-related needs will improve Outcome: Progressing   Problem: Clinical Measurements: Goal: Ability to maintain clinical measurements within normal limits will improve Outcome: Progressing Goal: Will remain free from infection Outcome: Progressing Goal: Diagnostic test results will improve Outcome: Progressing Goal: Respiratory complications will improve Outcome: Progressing Goal: Cardiovascular complication will be avoided Outcome: Progressing   Problem: Activity: Goal: Risk for activity intolerance will decrease Outcome: Progressing   Problem: Nutrition: Goal: Adequate nutrition will be maintained Outcome: Progressing   Problem: Coping: Goal: Level of anxiety will decrease Outcome: Progressing   Problem: Elimination: Goal: Will not experience complications related to bowel motility Outcome: Progressing Goal: Will not experience complications related to urinary retention Outcome: Progressing   Problem: Pain Managment: Goal: General experience of comfort will improve Outcome: Progressing   Problem: Safety: Goal: Ability to remain free from injury will improve Outcome: Progressing   Problem: Skin Integrity: Goal: Risk for impaired skin integrity will decrease Outcome: Progressing   Problem: Education: Goal: Required Educational Video(s) Outcome: Progressing   Problem: Clinical Measurements: Goal: Postoperative complications will be avoided or minimized Outcome:  Progressing   Problem: Skin Integrity: Goal: Demonstration of wound healing without infection will improve Outcome: Progressing

## 2020-12-19 NOTE — Progress Notes (Signed)
Physical Therapy Treatment Patient Details Name: [name] MRN: [id number] DOB: 09/09/1945 Today's Date: 12/19/2020   History of Present Illness Pt is a 75 y.o. female s/p R TKA secondary OA 12/18/20.  PMH includes sleep apnea with CPAP, DM, htn, h/o MI, CAD, cardiac stents, endometrial CA, Gravida 3 para 3, and thyroid CA (s/p thyroidectomy).    PT Comments    Patient tolerated session well and was agreeable to treatment. Upon arrival patient was ambulating in bathroom Mod I with cousin present, patient verbalized IV was bleeding and nurse was called. Dr. Raliegh Ip checked in with patient shortly after to reassess bandage. Nurse entered room to assess and remove IV. Patient ambulated a total of 280 feet through-out session with SBA. Was able to ambulate 4 steps and was able to verbalize proper stair sequencing. Patient was educated on proper car transfers, home safety, foot wear within the home, and bed safety with use of step stool for home. Patient would continue to benefit from OP PT in order to increase RLE strength, ROM, and balance.    Recommendations for follow up therapy are one component of a multi-disciplinary discharge planning process, led by the attending physician.  Recommendations may be updated based on patient status, additional functional criteria and insurance authorization.  Follow Up Recommendations  Outpatient PT     Equipment Recommendations  Rolling walker with 5" wheels    Recommendations for Other Services       Precautions / Restrictions Precautions Precautions: Fall;Knee Precaution Booklet Issued: Yes (comment) Precaution Comments: R KI at all times except when in PT Required Braces or Orthoses: Knee Immobilizer - Right Restrictions Weight Bearing Restrictions: Yes RLE Weight Bearing: Weight bearing as tolerated     Mobility  Bed Mobility Overal bed mobility: Modified Independent (Increased time and effort; required RUE support from R sided bed rail;  hooked RLE under LLE to lift legs up onto bed) Bed Mobility: Sit to Supine;Supine to Sit     Supine to sit: Modified independent (Device/Increase time) Sit to supine: Modified independent (Device/Increase time) (Increased time and effort; required RUE support from R sided bed rail; hooked RLE under LLE to lift legs up onto bed)        Transfers Overall transfer level: Modified independent Equipment used: Rolling walker (2 wheeled) Transfers: Sit to/from Stand Sit to Stand: Modified independent (Device/Increase time)            Ambulation/Gait Ambulation/Gait assistance: Modified independent (Device/Increase time) Gait Distance (Feet):  (x100; x180 with seated rest break inbetween) Assistive device: Rolling walker (2 wheeled) Gait Pattern/deviations: Step-through pattern;Decreased stance time - right         Stairs Stairs: Yes Stairs assistance: Min guard Stair Management: One rail Left;Sideways;Step to pattern Number of Stairs: 4 General stair comments: BUE support on L hand rail   Wheelchair Mobility    Modified Rankin (Stroke Patients Only)       Balance Overall balance assessment: Modified Independent Sitting-balance support: No upper extremity supported Sitting balance-Leahy Scale: Normal     Standing balance support: Bilateral upper extremity supported (from 2 wheeled walker) Standing balance-Leahy Scale: Good Standing balance comment: no LOB during functional balance activites during session                            Cognition Arousal/Alertness: Awake/alert Behavior During Therapy: WFL for tasks assessed/performed Overall Cognitive Status: Within Functional Limits for tasks assessed  Exercises Total Joint Exercises Long Arc Quad: Seated;Right;AAROM;10 reps (required assist at the heel for isometric hold at the top due to decreased strength) Knee Flexion: Seated;AROM;Right;10  reps    General Comments        Pertinent Vitals/Pain Pain Assessment: 0-10 Pain Score: 5  Pain Location: R knee Pain Descriptors / Indicators: Aching Pain Intervention(s): Limited activity within patient's tolerance;Monitored during session;Premedicated before session;Repositioned;Other (comment) (Polar Care applied)    Home Living                      Prior Function            PT Goals (current goals can now be found in the care plan section) Acute Rehab PT Goals Patient Stated Goal: To go home today PT Goal Formulation: With patient Time For Goal Achievement: 01/01/21 Potential to Achieve Goals: Good Progress towards PT goals: Progressing toward goals    Frequency    BID      PT Plan Current plan remains appropriate    Co-evaluation              AM-PAC PT "6 Clicks" Mobility   Outcome Measure  Help needed turning from your back to your side while in a flat bed without using bedrails?: None Help needed moving from lying on your back to sitting on the side of a flat bed without using bedrails?: None Help needed moving to and from a bed to a chair (including a wheelchair)?: None Help needed standing up from a chair using your arms (e.g., wheelchair or bedside chair)?: None Help needed to walk in hospital room?: None Help needed climbing 3-5 steps with a railing? : A Little 6 Click Score: 23    End of Session Equipment Utilized During Treatment: Gait belt Activity Tolerance: Patient tolerated treatment well;No increased pain Patient left: in bed;with call bell/phone within reach;with bed alarm set;with family/visitor present;with SCD's reapplied;Other (comment) (towel roll placed under RLE to offload right heel) Nurse Communication: Mobility status (nurse updated on discharge plans) PT Visit Diagnosis: Other abnormalities of gait and mobility (R26.89);Muscle weakness (generalized) (M62.81);Pain Pain - Right/Left: Right Pain - part of body: Knee      Time: 1340-1425 PT Time Calculation (min) (ACUTE ONLY): 45 min  Charges:  $Therapeutic Exercise: 8-22 mins $Therapeutic Activity: 23-37 mins                      Vena Rua, PT 12/19/2020, 4:22 PM

## 2020-12-19 NOTE — Anesthesia Postprocedure Evaluation (Signed)
Anesthesia Post Note  Patient: Tracyann Duffell Stilley  Procedure(s) Performed: RIGHT TOTAL KNEE ARTHROPLASTY (Right: Knee)  Patient location during evaluation: Nursing Unit Anesthesia Type: Spinal Level of consciousness: awake and alert and oriented Pain management: pain level controlled Vital Signs Assessment: post-procedure vital signs reviewed and stable Respiratory status: respiratory function stable Cardiovascular status: stable Postop Assessment: no headache, no backache, patient able to bend at knees, no apparent nausea or vomiting, able to ambulate and adequate PO intake Anesthetic complications: no   No notable events documented.   Last Vitals:  Vitals:   12/19/20 0441 12/19/20 0748  BP: 117/61 (!) 133/57  Pulse: 64 66  Resp: 16 18  Temp: 36.7 C 36.9 C  SpO2: 100% 100%    Last Pain:  Vitals:   12/18/20 2315  TempSrc:   PainSc: 5                  Lanora Manis

## 2020-12-19 NOTE — Progress Notes (Signed)
Physical Therapy Treatment Patient Details Name: [name] MRN: [id number] DOB: February 02, 1946 Today's Date: 12/19/2020   History of Present Illness Pt is a 75 y.o. female s/p R TKA secondary OA 12/18/20.  PMH includes sleep apnea with CPAP, DM, htn, h/o MI, CAD, cardiac stents, endometrial CA, Gravida 3 para 3, and thyroid CA (s/p thyroidectomy).    PT Comments    Pt resting in recliner upon PT arrival; agreeable to PT session.  4/10 R knee pain at rest beginning of session and 5/10 R knee pain at rest end of session (pt reports receiving recent muscle relaxer and declined additional pain meds--nurse notified of this and pt's pain).  During session pt SBA with transfers, SBA with ambulation 100 feet x2 with RW, and CGA navigating 8 steps with railing.  Pt became nauseas after performing stairs but resolved with sitting rest break (nurse notified).  R knee AROM 10-80 degrees.  Pt's cousin brought in walker from home and RW adjusted for proper height/fit for pt.  Will continue to focus on strengthening, knee ROM, and progressive functional mobility during hospitalization.   Recommendations for follow up therapy are one component of a multi-disciplinary discharge planning process, led by the attending physician.  Recommendations may be updated based on patient status, additional functional criteria and insurance authorization.  Follow Up Recommendations  Outpatient PT (pt has OP PT set up already)     Equipment Recommendations  Rolling walker with 5" wheels    Recommendations for Other Services OT consult     Precautions / Restrictions Precautions Precautions: Knee;Fall Precaution Booklet Issued: Yes (comment) Precaution Comments: R KI at all times except when in CPM/PT Required Braces or Orthoses: Knee Immobilizer - Right Restrictions Weight Bearing Restrictions: Yes RLE Weight Bearing: Weight bearing as tolerated     Mobility  Bed Mobility               General bed  mobility comments: Deferred (pt up in recliner beginning/end of session)    Transfers Overall transfer level: Needs assistance Equipment used: Rolling walker (2 wheeled) Transfers: Sit to/from Stand Sit to Stand: Supervision         General transfer comment: occasional vc's for UE/LE placement; x2 trials from recliner and x2 trials from mat table and x1 trial from [employer] over toilet; steady with RW  Ambulation/Gait Ambulation/Gait assistance: Supervision Gait Distance (Feet):  (100 feet x2) Assistive device: Rolling walker (2 wheeled)   Gait velocity: decreased   General Gait Details: antalgic; mild decreased stance time R LE; step to progressing to partial step through gait pattern   Stairs Stairs: Yes Stairs assistance: Min guard Stair Management: One rail Left;Sideways Number of Stairs: 8 General stair comments: initial vc's and demo for technique 1st trial of 4 steps and then pt able to perform safely on own without any further cuing 2nd trial of 4 steps   Wheelchair Mobility    Modified Rankin (Stroke Patients Only)       Balance Overall balance assessment: Needs assistance Sitting-balance support: No upper extremity supported;Feet supported Sitting balance-Leahy Scale: Normal Sitting balance - Comments: steady sitting reaching outside BOS   Standing balance support: Bilateral upper extremity supported;During functional activity Standing balance-Leahy Scale: Good Standing balance comment: steady with ambulation using RW; steady standing washing hands at sink                            Cognition Arousal/Alertness: Awake/alert Behavior During Therapy:  WFL for tasks assessed/performed Overall Cognitive Status: Within Functional Limits for tasks assessed                                        Exercises Total Joint Exercises Ankle Circles/Pumps: AROM;Strengthening;Both;10 reps;Supine Quad Sets: AROM;Strengthening;Both;10  reps;Supine Short Arc Quad: AROM;Strengthening;Right;10 reps;Supine Heel Slides: AAROM;Strengthening;Right;10 reps;Supine Hip ABduction/ADduction: AROM;Strengthening;Right;10 reps;Supine Straight Leg Raises: AAROM;Strengthening;Right;10 reps;Supine Goniometric ROM: R knee AROM 10-80 degrees    General Comments  Pt agreeable to PT session.      Pertinent Vitals/Pain Pain Assessment: 0-10 Pain Score: 5  Pain Location: R knee Pain Descriptors / Indicators: Aching Pain Intervention(s): Limited activity within patient's tolerance;Monitored during session;Repositioned;Premedicated before session;Other (comment) (polar care applied) Vitals (HR and O2 on room air) stable and WFL throughout treatment session.    Home Living Family/patient expects to be discharged to:: Private residence Living Arrangements: Alone Available Help at Discharge: Family Type of Home: House Home Access: Stairs to enter Entrance Stairs-Rails: Left Home Layout: One level Home Equipment: Shower seat - built in;Grab bars - toilet;Grab bars - tub/shower      Prior Function Level of Independence: Independent          PT Goals (current goals can now be found in the care plan section) Acute Rehab PT Goals Patient Stated Goal: to go home today PT Goal Formulation: With patient Time For Goal Achievement: 01/01/21 Potential to Achieve Goals: Good Progress towards PT goals: Progressing toward goals    Frequency    BID      PT Plan Current plan remains appropriate    Co-evaluation              AM-PAC PT "6 Clicks" Mobility   Outcome Measure  Help needed turning from your back to your side while in a flat bed without using bedrails?: None Help needed moving from lying on your back to sitting on the side of a flat bed without using bedrails?: A Little Help needed moving to and from a bed to a chair (including a wheelchair)?: A Little Help needed standing up from a chair using your arms (e.g.,  wheelchair or bedside chair)?: A Little Help needed to walk in hospital room?: A Little Help needed climbing 3-5 steps with a railing? : A Little 6 Click Score: 19    End of Session Equipment Utilized During Treatment: Gait belt;Right knee immobilizer Activity Tolerance: Patient tolerated treatment well Patient left: in chair;with call bell/phone within reach;with chair alarm set;with family/visitor present;with SCD's reapplied;Other (comment) (R KI in place and R heel floating via towel roll) Nurse Communication: Mobility status;Precautions;Other (comment);Weight bearing status (pt's pain status) PT Visit Diagnosis: Other abnormalities of gait and mobility (R26.89);Muscle weakness (generalized) (M62.81);Pain Pain - Right/Left: Right Pain - part of body: Knee     Time: 1012-1109 PT Time Calculation (min) (ACUTE ONLY): 57 min  Charges:  $Gait Training: 23-37 mins $Therapeutic Exercise: 8-22 mins $Therapeutic Activity: 8-22 mins                    Leitha Bleak, PT 12/19/20, 11:38 AM

## 2020-12-19 NOTE — Progress Notes (Signed)
Met with the patient at the bedside to discuss DC plan and needs She lives alone but her cousin will be coming to help She has a rolling walker at home and needs a 3 in 1, [employer] will deliver to the room prior to DC She has transportation and can afford her medication She has an outpatient PT appointment set up for Tuesday with [facility] She has no additional needs

## 2020-12-19 NOTE — Progress Notes (Signed)
Patient d/c home with outpatient PT.  IV removed. Tip intact. No hard scripts given. All belonging with patient. D/C paperwork given. No unanswered questions. Family at bedside and will transport via private vehicle home.

## 2020-12-23 DIAGNOSIS — M25561 Pain in right knee: Secondary | ICD-10-CM | POA: Diagnosis not present

## 2020-12-23 DIAGNOSIS — R262 Difficulty in walking, not elsewhere classified: Secondary | ICD-10-CM | POA: Diagnosis not present

## 2020-12-23 DIAGNOSIS — M25662 Stiffness of left knee, not elsewhere classified: Secondary | ICD-10-CM | POA: Diagnosis not present

## 2020-12-23 DIAGNOSIS — Z96651 Presence of right artificial knee joint: Secondary | ICD-10-CM | POA: Diagnosis not present

## 2020-12-23 DIAGNOSIS — M25661 Stiffness of right knee, not elsewhere classified: Secondary | ICD-10-CM | POA: Diagnosis not present

## 2020-12-24 DIAGNOSIS — M25561 Pain in right knee: Secondary | ICD-10-CM | POA: Diagnosis not present

## 2020-12-24 DIAGNOSIS — M25662 Stiffness of left knee, not elsewhere classified: Secondary | ICD-10-CM | POA: Diagnosis not present

## 2020-12-24 DIAGNOSIS — R262 Difficulty in walking, not elsewhere classified: Secondary | ICD-10-CM | POA: Diagnosis not present

## 2020-12-24 DIAGNOSIS — M25661 Stiffness of right knee, not elsewhere classified: Secondary | ICD-10-CM | POA: Diagnosis not present

## 2020-12-24 DIAGNOSIS — Z96651 Presence of right artificial knee joint: Secondary | ICD-10-CM | POA: Diagnosis not present

## 2020-12-25 ENCOUNTER — Encounter: Payer: Self-pay | Admitting: Family Medicine

## 2020-12-26 DIAGNOSIS — M25662 Stiffness of left knee, not elsewhere classified: Secondary | ICD-10-CM | POA: Diagnosis not present

## 2020-12-26 DIAGNOSIS — Z96651 Presence of right artificial knee joint: Secondary | ICD-10-CM | POA: Diagnosis not present

## 2020-12-26 DIAGNOSIS — R262 Difficulty in walking, not elsewhere classified: Secondary | ICD-10-CM | POA: Diagnosis not present

## 2020-12-26 DIAGNOSIS — M25561 Pain in right knee: Secondary | ICD-10-CM | POA: Diagnosis not present

## 2020-12-26 DIAGNOSIS — M25661 Stiffness of right knee, not elsewhere classified: Secondary | ICD-10-CM | POA: Diagnosis not present

## 2020-12-29 DIAGNOSIS — Z96651 Presence of right artificial knee joint: Secondary | ICD-10-CM | POA: Diagnosis not present

## 2020-12-29 DIAGNOSIS — M25661 Stiffness of right knee, not elsewhere classified: Secondary | ICD-10-CM | POA: Diagnosis not present

## 2020-12-29 DIAGNOSIS — M25662 Stiffness of left knee, not elsewhere classified: Secondary | ICD-10-CM | POA: Diagnosis not present

## 2020-12-29 DIAGNOSIS — M25561 Pain in right knee: Secondary | ICD-10-CM | POA: Diagnosis not present

## 2020-12-29 DIAGNOSIS — R262 Difficulty in walking, not elsewhere classified: Secondary | ICD-10-CM | POA: Diagnosis not present

## 2020-12-30 ENCOUNTER — Other Ambulatory Visit: Payer: Medicare Other

## 2020-12-31 DIAGNOSIS — M25661 Stiffness of right knee, not elsewhere classified: Secondary | ICD-10-CM | POA: Diagnosis not present

## 2020-12-31 DIAGNOSIS — R262 Difficulty in walking, not elsewhere classified: Secondary | ICD-10-CM | POA: Diagnosis not present

## 2020-12-31 DIAGNOSIS — M25662 Stiffness of left knee, not elsewhere classified: Secondary | ICD-10-CM | POA: Diagnosis not present

## 2020-12-31 DIAGNOSIS — M25561 Pain in right knee: Secondary | ICD-10-CM | POA: Diagnosis not present

## 2020-12-31 DIAGNOSIS — Z96651 Presence of right artificial knee joint: Secondary | ICD-10-CM | POA: Diagnosis not present

## 2021-01-02 DIAGNOSIS — M25662 Stiffness of left knee, not elsewhere classified: Secondary | ICD-10-CM | POA: Diagnosis not present

## 2021-01-02 DIAGNOSIS — R262 Difficulty in walking, not elsewhere classified: Secondary | ICD-10-CM | POA: Diagnosis not present

## 2021-01-02 DIAGNOSIS — M25661 Stiffness of right knee, not elsewhere classified: Secondary | ICD-10-CM | POA: Diagnosis not present

## 2021-01-02 DIAGNOSIS — M25561 Pain in right knee: Secondary | ICD-10-CM | POA: Diagnosis not present

## 2021-01-02 DIAGNOSIS — Z96651 Presence of right artificial knee joint: Secondary | ICD-10-CM | POA: Diagnosis not present

## 2021-01-06 ENCOUNTER — Encounter: Payer: Medicare Other | Admitting: Family Medicine

## 2021-01-06 DIAGNOSIS — Z96651 Presence of right artificial knee joint: Secondary | ICD-10-CM | POA: Diagnosis not present

## 2021-01-06 DIAGNOSIS — M25561 Pain in right knee: Secondary | ICD-10-CM | POA: Diagnosis not present

## 2021-01-06 DIAGNOSIS — M25661 Stiffness of right knee, not elsewhere classified: Secondary | ICD-10-CM | POA: Diagnosis not present

## 2021-01-06 DIAGNOSIS — M25662 Stiffness of left knee, not elsewhere classified: Secondary | ICD-10-CM | POA: Diagnosis not present

## 2021-01-06 DIAGNOSIS — R262 Difficulty in walking, not elsewhere classified: Secondary | ICD-10-CM | POA: Diagnosis not present

## 2021-01-07 DIAGNOSIS — M25561 Pain in right knee: Secondary | ICD-10-CM | POA: Diagnosis not present

## 2021-01-07 DIAGNOSIS — M25662 Stiffness of left knee, not elsewhere classified: Secondary | ICD-10-CM | POA: Diagnosis not present

## 2021-01-07 DIAGNOSIS — M25661 Stiffness of right knee, not elsewhere classified: Secondary | ICD-10-CM | POA: Diagnosis not present

## 2021-01-07 DIAGNOSIS — Z96651 Presence of right artificial knee joint: Secondary | ICD-10-CM | POA: Diagnosis not present

## 2021-01-07 DIAGNOSIS — R262 Difficulty in walking, not elsewhere classified: Secondary | ICD-10-CM | POA: Diagnosis not present

## 2021-01-09 DIAGNOSIS — M25561 Pain in right knee: Secondary | ICD-10-CM | POA: Diagnosis not present

## 2021-01-09 DIAGNOSIS — M25661 Stiffness of right knee, not elsewhere classified: Secondary | ICD-10-CM | POA: Diagnosis not present

## 2021-01-09 DIAGNOSIS — R262 Difficulty in walking, not elsewhere classified: Secondary | ICD-10-CM | POA: Diagnosis not present

## 2021-01-09 DIAGNOSIS — Z96651 Presence of right artificial knee joint: Secondary | ICD-10-CM | POA: Diagnosis not present

## 2021-01-09 DIAGNOSIS — M25662 Stiffness of left knee, not elsewhere classified: Secondary | ICD-10-CM | POA: Diagnosis not present

## 2021-01-12 DIAGNOSIS — M25561 Pain in right knee: Secondary | ICD-10-CM | POA: Diagnosis not present

## 2021-01-12 DIAGNOSIS — M25662 Stiffness of left knee, not elsewhere classified: Secondary | ICD-10-CM | POA: Diagnosis not present

## 2021-01-12 DIAGNOSIS — Z96651 Presence of right artificial knee joint: Secondary | ICD-10-CM | POA: Diagnosis not present

## 2021-01-12 DIAGNOSIS — R262 Difficulty in walking, not elsewhere classified: Secondary | ICD-10-CM | POA: Diagnosis not present

## 2021-01-12 DIAGNOSIS — M25661 Stiffness of right knee, not elsewhere classified: Secondary | ICD-10-CM | POA: Diagnosis not present

## 2021-01-14 DIAGNOSIS — M25561 Pain in right knee: Secondary | ICD-10-CM | POA: Diagnosis not present

## 2021-01-14 DIAGNOSIS — M25661 Stiffness of right knee, not elsewhere classified: Secondary | ICD-10-CM | POA: Diagnosis not present

## 2021-01-14 DIAGNOSIS — R262 Difficulty in walking, not elsewhere classified: Secondary | ICD-10-CM | POA: Diagnosis not present

## 2021-01-14 DIAGNOSIS — Z96651 Presence of right artificial knee joint: Secondary | ICD-10-CM | POA: Diagnosis not present

## 2021-01-14 DIAGNOSIS — M25662 Stiffness of left knee, not elsewhere classified: Secondary | ICD-10-CM | POA: Diagnosis not present

## 2021-01-16 DIAGNOSIS — M25661 Stiffness of right knee, not elsewhere classified: Secondary | ICD-10-CM | POA: Diagnosis not present

## 2021-01-16 DIAGNOSIS — M25662 Stiffness of left knee, not elsewhere classified: Secondary | ICD-10-CM | POA: Diagnosis not present

## 2021-01-16 DIAGNOSIS — R262 Difficulty in walking, not elsewhere classified: Secondary | ICD-10-CM | POA: Diagnosis not present

## 2021-01-16 DIAGNOSIS — Z96651 Presence of right artificial knee joint: Secondary | ICD-10-CM | POA: Diagnosis not present

## 2021-01-16 DIAGNOSIS — M25561 Pain in right knee: Secondary | ICD-10-CM | POA: Diagnosis not present

## 2021-01-19 DIAGNOSIS — M25661 Stiffness of right knee, not elsewhere classified: Secondary | ICD-10-CM | POA: Diagnosis not present

## 2021-01-19 DIAGNOSIS — Z96651 Presence of right artificial knee joint: Secondary | ICD-10-CM | POA: Diagnosis not present

## 2021-01-19 DIAGNOSIS — M25561 Pain in right knee: Secondary | ICD-10-CM | POA: Diagnosis not present

## 2021-01-19 DIAGNOSIS — M25662 Stiffness of left knee, not elsewhere classified: Secondary | ICD-10-CM | POA: Diagnosis not present

## 2021-01-19 DIAGNOSIS — R262 Difficulty in walking, not elsewhere classified: Secondary | ICD-10-CM | POA: Diagnosis not present

## 2021-01-21 DIAGNOSIS — M25662 Stiffness of left knee, not elsewhere classified: Secondary | ICD-10-CM | POA: Diagnosis not present

## 2021-01-21 DIAGNOSIS — R262 Difficulty in walking, not elsewhere classified: Secondary | ICD-10-CM | POA: Diagnosis not present

## 2021-01-21 DIAGNOSIS — M25661 Stiffness of right knee, not elsewhere classified: Secondary | ICD-10-CM | POA: Diagnosis not present

## 2021-01-21 DIAGNOSIS — Z96651 Presence of right artificial knee joint: Secondary | ICD-10-CM | POA: Diagnosis not present

## 2021-01-21 DIAGNOSIS — M25561 Pain in right knee: Secondary | ICD-10-CM | POA: Diagnosis not present

## 2021-01-22 ENCOUNTER — Other Ambulatory Visit: Payer: Self-pay | Admitting: Cardiovascular Disease

## 2021-01-22 DIAGNOSIS — I10 Essential (primary) hypertension: Secondary | ICD-10-CM

## 2021-01-23 DIAGNOSIS — Z96651 Presence of right artificial knee joint: Secondary | ICD-10-CM | POA: Diagnosis not present

## 2021-01-23 DIAGNOSIS — R262 Difficulty in walking, not elsewhere classified: Secondary | ICD-10-CM | POA: Diagnosis not present

## 2021-01-23 DIAGNOSIS — M25662 Stiffness of left knee, not elsewhere classified: Secondary | ICD-10-CM | POA: Diagnosis not present

## 2021-01-23 DIAGNOSIS — M25561 Pain in right knee: Secondary | ICD-10-CM | POA: Diagnosis not present

## 2021-01-23 DIAGNOSIS — M25661 Stiffness of right knee, not elsewhere classified: Secondary | ICD-10-CM | POA: Diagnosis not present

## 2021-01-26 DIAGNOSIS — M25661 Stiffness of right knee, not elsewhere classified: Secondary | ICD-10-CM | POA: Diagnosis not present

## 2021-01-26 DIAGNOSIS — R262 Difficulty in walking, not elsewhere classified: Secondary | ICD-10-CM | POA: Diagnosis not present

## 2021-01-26 DIAGNOSIS — M25662 Stiffness of left knee, not elsewhere classified: Secondary | ICD-10-CM | POA: Diagnosis not present

## 2021-01-26 DIAGNOSIS — Z96651 Presence of right artificial knee joint: Secondary | ICD-10-CM | POA: Diagnosis not present

## 2021-01-26 DIAGNOSIS — M25561 Pain in right knee: Secondary | ICD-10-CM | POA: Diagnosis not present

## 2021-01-26 DIAGNOSIS — Z96659 Presence of unspecified artificial knee joint: Secondary | ICD-10-CM | POA: Diagnosis not present

## 2021-01-26 NOTE — Telephone Encounter (Signed)
Please verify dosage of amlodipine. Med list states patient is to take amlodipine 2.5 mg daily.  Per last office note on 11/26/20- HTN BP today good. Continue amlodipine 10mg  daily, Coreg 3.125mg  BID, Losartan 100mg  daily.   Thank you!

## 2021-01-28 DIAGNOSIS — M25662 Stiffness of left knee, not elsewhere classified: Secondary | ICD-10-CM | POA: Diagnosis not present

## 2021-01-28 DIAGNOSIS — Z96651 Presence of right artificial knee joint: Secondary | ICD-10-CM | POA: Diagnosis not present

## 2021-01-28 DIAGNOSIS — R262 Difficulty in walking, not elsewhere classified: Secondary | ICD-10-CM | POA: Diagnosis not present

## 2021-01-28 DIAGNOSIS — M25661 Stiffness of right knee, not elsewhere classified: Secondary | ICD-10-CM | POA: Diagnosis not present

## 2021-01-28 DIAGNOSIS — M25561 Pain in right knee: Secondary | ICD-10-CM | POA: Diagnosis not present

## 2021-01-29 ENCOUNTER — Other Ambulatory Visit: Payer: Self-pay | Admitting: Cardiovascular Disease

## 2021-01-30 DIAGNOSIS — M25662 Stiffness of left knee, not elsewhere classified: Secondary | ICD-10-CM | POA: Diagnosis not present

## 2021-01-30 DIAGNOSIS — M25661 Stiffness of right knee, not elsewhere classified: Secondary | ICD-10-CM | POA: Diagnosis not present

## 2021-01-30 DIAGNOSIS — M25561 Pain in right knee: Secondary | ICD-10-CM | POA: Diagnosis not present

## 2021-01-30 DIAGNOSIS — R262 Difficulty in walking, not elsewhere classified: Secondary | ICD-10-CM | POA: Diagnosis not present

## 2021-01-30 DIAGNOSIS — Z96651 Presence of right artificial knee joint: Secondary | ICD-10-CM | POA: Diagnosis not present

## 2021-02-02 DIAGNOSIS — R262 Difficulty in walking, not elsewhere classified: Secondary | ICD-10-CM | POA: Diagnosis not present

## 2021-02-02 DIAGNOSIS — M25561 Pain in right knee: Secondary | ICD-10-CM | POA: Diagnosis not present

## 2021-02-02 DIAGNOSIS — Z96651 Presence of right artificial knee joint: Secondary | ICD-10-CM | POA: Diagnosis not present

## 2021-02-02 DIAGNOSIS — M25661 Stiffness of right knee, not elsewhere classified: Secondary | ICD-10-CM | POA: Diagnosis not present

## 2021-02-02 DIAGNOSIS — M25662 Stiffness of left knee, not elsewhere classified: Secondary | ICD-10-CM | POA: Diagnosis not present

## 2021-02-04 DIAGNOSIS — M25661 Stiffness of right knee, not elsewhere classified: Secondary | ICD-10-CM | POA: Diagnosis not present

## 2021-02-04 DIAGNOSIS — R262 Difficulty in walking, not elsewhere classified: Secondary | ICD-10-CM | POA: Diagnosis not present

## 2021-02-04 DIAGNOSIS — M25662 Stiffness of left knee, not elsewhere classified: Secondary | ICD-10-CM | POA: Diagnosis not present

## 2021-02-04 DIAGNOSIS — Z96651 Presence of right artificial knee joint: Secondary | ICD-10-CM | POA: Diagnosis not present

## 2021-02-04 DIAGNOSIS — M25561 Pain in right knee: Secondary | ICD-10-CM | POA: Diagnosis not present

## 2021-02-06 DIAGNOSIS — M25661 Stiffness of right knee, not elsewhere classified: Secondary | ICD-10-CM | POA: Diagnosis not present

## 2021-02-06 DIAGNOSIS — Z96651 Presence of right artificial knee joint: Secondary | ICD-10-CM | POA: Diagnosis not present

## 2021-02-06 DIAGNOSIS — R262 Difficulty in walking, not elsewhere classified: Secondary | ICD-10-CM | POA: Diagnosis not present

## 2021-02-06 DIAGNOSIS — M25561 Pain in right knee: Secondary | ICD-10-CM | POA: Diagnosis not present

## 2021-02-06 DIAGNOSIS — M25662 Stiffness of left knee, not elsewhere classified: Secondary | ICD-10-CM | POA: Diagnosis not present

## 2021-02-07 ENCOUNTER — Other Ambulatory Visit: Payer: Self-pay | Admitting: Cardiovascular Disease

## 2021-02-07 DIAGNOSIS — E1169 Type 2 diabetes mellitus with other specified complication: Secondary | ICD-10-CM

## 2021-02-07 DIAGNOSIS — I10 Essential (primary) hypertension: Secondary | ICD-10-CM

## 2021-02-07 DIAGNOSIS — E890 Postprocedural hypothyroidism: Secondary | ICD-10-CM

## 2021-02-09 DIAGNOSIS — R262 Difficulty in walking, not elsewhere classified: Secondary | ICD-10-CM | POA: Diagnosis not present

## 2021-02-09 DIAGNOSIS — Z96651 Presence of right artificial knee joint: Secondary | ICD-10-CM | POA: Diagnosis not present

## 2021-02-09 DIAGNOSIS — M25662 Stiffness of left knee, not elsewhere classified: Secondary | ICD-10-CM | POA: Diagnosis not present

## 2021-02-09 DIAGNOSIS — M25561 Pain in right knee: Secondary | ICD-10-CM | POA: Diagnosis not present

## 2021-02-09 DIAGNOSIS — M25661 Stiffness of right knee, not elsewhere classified: Secondary | ICD-10-CM | POA: Diagnosis not present

## 2021-02-11 DIAGNOSIS — M25661 Stiffness of right knee, not elsewhere classified: Secondary | ICD-10-CM | POA: Diagnosis not present

## 2021-02-11 DIAGNOSIS — Z96651 Presence of right artificial knee joint: Secondary | ICD-10-CM | POA: Diagnosis not present

## 2021-02-11 DIAGNOSIS — M25662 Stiffness of left knee, not elsewhere classified: Secondary | ICD-10-CM | POA: Diagnosis not present

## 2021-02-11 DIAGNOSIS — M25561 Pain in right knee: Secondary | ICD-10-CM | POA: Diagnosis not present

## 2021-02-11 DIAGNOSIS — R262 Difficulty in walking, not elsewhere classified: Secondary | ICD-10-CM | POA: Diagnosis not present

## 2021-02-12 ENCOUNTER — Ambulatory Visit: Payer: Medicare Other

## 2021-02-13 DIAGNOSIS — R262 Difficulty in walking, not elsewhere classified: Secondary | ICD-10-CM | POA: Diagnosis not present

## 2021-02-13 DIAGNOSIS — M25662 Stiffness of left knee, not elsewhere classified: Secondary | ICD-10-CM | POA: Diagnosis not present

## 2021-02-13 DIAGNOSIS — M25561 Pain in right knee: Secondary | ICD-10-CM | POA: Diagnosis not present

## 2021-02-13 DIAGNOSIS — M25661 Stiffness of right knee, not elsewhere classified: Secondary | ICD-10-CM | POA: Diagnosis not present

## 2021-02-13 DIAGNOSIS — Z96651 Presence of right artificial knee joint: Secondary | ICD-10-CM | POA: Diagnosis not present

## 2021-02-18 ENCOUNTER — Telehealth: Payer: Medicare Other | Admitting: Physician Assistant

## 2021-02-18 ENCOUNTER — Encounter: Payer: Self-pay | Admitting: Family Medicine

## 2021-02-18 DIAGNOSIS — B9789 Other viral agents as the cause of diseases classified elsewhere: Secondary | ICD-10-CM

## 2021-02-18 DIAGNOSIS — J019 Acute sinusitis, unspecified: Secondary | ICD-10-CM

## 2021-02-18 MED ORDER — FLUTICASONE PROPIONATE 50 MCG/ACT NA SUSP: 2.0000 | Freq: Every day | NASAL | 0 refills | Status: AC

## 2021-02-18 NOTE — Progress Notes (Signed)
I have spent 5 minutes in review of e-visit questionnaire, review and updating patient chart, medical decision making and response to patient.   Analaura Messler Cody Jarian Longoria, PA-C    

## 2021-02-18 NOTE — Progress Notes (Signed)

## 2021-02-26 DIAGNOSIS — R262 Difficulty in walking, not elsewhere classified: Secondary | ICD-10-CM | POA: Diagnosis not present

## 2021-02-26 DIAGNOSIS — M25561 Pain in right knee: Secondary | ICD-10-CM | POA: Diagnosis not present

## 2021-02-26 DIAGNOSIS — Z96651 Presence of right artificial knee joint: Secondary | ICD-10-CM | POA: Diagnosis not present

## 2021-02-26 DIAGNOSIS — M25662 Stiffness of left knee, not elsewhere classified: Secondary | ICD-10-CM | POA: Diagnosis not present

## 2021-02-26 DIAGNOSIS — M25661 Stiffness of right knee, not elsewhere classified: Secondary | ICD-10-CM | POA: Diagnosis not present

## 2021-02-27 ENCOUNTER — Ambulatory Visit: Payer: Medicare Other

## 2021-03-04 DIAGNOSIS — Z96651 Presence of right artificial knee joint: Secondary | ICD-10-CM | POA: Diagnosis not present

## 2021-03-04 DIAGNOSIS — M25662 Stiffness of left knee, not elsewhere classified: Secondary | ICD-10-CM | POA: Diagnosis not present

## 2021-03-04 DIAGNOSIS — M25661 Stiffness of right knee, not elsewhere classified: Secondary | ICD-10-CM | POA: Diagnosis not present

## 2021-03-04 DIAGNOSIS — R262 Difficulty in walking, not elsewhere classified: Secondary | ICD-10-CM | POA: Diagnosis not present

## 2021-03-04 DIAGNOSIS — M25561 Pain in right knee: Secondary | ICD-10-CM | POA: Diagnosis not present

## 2021-03-06 DIAGNOSIS — M25662 Stiffness of left knee, not elsewhere classified: Secondary | ICD-10-CM | POA: Diagnosis not present

## 2021-03-06 DIAGNOSIS — M25561 Pain in right knee: Secondary | ICD-10-CM | POA: Diagnosis not present

## 2021-03-06 DIAGNOSIS — M25661 Stiffness of right knee, not elsewhere classified: Secondary | ICD-10-CM | POA: Diagnosis not present

## 2021-03-06 DIAGNOSIS — Z96651 Presence of right artificial knee joint: Secondary | ICD-10-CM | POA: Diagnosis not present

## 2021-03-06 DIAGNOSIS — R262 Difficulty in walking, not elsewhere classified: Secondary | ICD-10-CM | POA: Diagnosis not present

## 2021-03-09 DIAGNOSIS — M25561 Pain in right knee: Secondary | ICD-10-CM | POA: Diagnosis not present

## 2021-03-09 DIAGNOSIS — M25662 Stiffness of left knee, not elsewhere classified: Secondary | ICD-10-CM | POA: Diagnosis not present

## 2021-03-09 DIAGNOSIS — Z96651 Presence of right artificial knee joint: Secondary | ICD-10-CM | POA: Diagnosis not present

## 2021-03-09 DIAGNOSIS — R262 Difficulty in walking, not elsewhere classified: Secondary | ICD-10-CM | POA: Diagnosis not present

## 2021-03-09 DIAGNOSIS — M25661 Stiffness of right knee, not elsewhere classified: Secondary | ICD-10-CM | POA: Diagnosis not present

## 2021-03-11 DIAGNOSIS — R262 Difficulty in walking, not elsewhere classified: Secondary | ICD-10-CM | POA: Diagnosis not present

## 2021-03-11 DIAGNOSIS — M25662 Stiffness of left knee, not elsewhere classified: Secondary | ICD-10-CM | POA: Diagnosis not present

## 2021-03-11 DIAGNOSIS — Z96651 Presence of right artificial knee joint: Secondary | ICD-10-CM | POA: Diagnosis not present

## 2021-03-11 DIAGNOSIS — M25561 Pain in right knee: Secondary | ICD-10-CM | POA: Diagnosis not present

## 2021-03-11 DIAGNOSIS — M25661 Stiffness of right knee, not elsewhere classified: Secondary | ICD-10-CM | POA: Diagnosis not present

## 2021-03-20 DIAGNOSIS — M1712 Unilateral primary osteoarthritis, left knee: Secondary | ICD-10-CM | POA: Diagnosis not present

## 2021-03-23 DIAGNOSIS — M25662 Stiffness of left knee, not elsewhere classified: Secondary | ICD-10-CM | POA: Diagnosis not present

## 2021-03-23 DIAGNOSIS — R262 Difficulty in walking, not elsewhere classified: Secondary | ICD-10-CM | POA: Diagnosis not present

## 2021-03-23 DIAGNOSIS — M25661 Stiffness of right knee, not elsewhere classified: Secondary | ICD-10-CM | POA: Diagnosis not present

## 2021-03-23 DIAGNOSIS — Z96651 Presence of right artificial knee joint: Secondary | ICD-10-CM | POA: Diagnosis not present

## 2021-03-23 DIAGNOSIS — M25561 Pain in right knee: Secondary | ICD-10-CM | POA: Diagnosis not present

## 2021-03-24 DIAGNOSIS — R262 Difficulty in walking, not elsewhere classified: Secondary | ICD-10-CM | POA: Diagnosis not present

## 2021-03-24 DIAGNOSIS — M25662 Stiffness of left knee, not elsewhere classified: Secondary | ICD-10-CM | POA: Diagnosis not present

## 2021-03-24 DIAGNOSIS — M25562 Pain in left knee: Secondary | ICD-10-CM | POA: Diagnosis not present

## 2021-03-27 ENCOUNTER — Other Ambulatory Visit: Payer: Self-pay

## 2021-03-27 ENCOUNTER — Ambulatory Visit (INDEPENDENT_AMBULATORY_CARE_PROVIDER_SITE_OTHER): Payer: Medicare Other

## 2021-03-27 VITALS — BP 144/68 | HR 59 | Temp 98.5°F | Ht 67.0 in | Wt 230.2 lb

## 2021-03-27 DIAGNOSIS — Z780 Asymptomatic menopausal state: Secondary | ICD-10-CM | POA: Diagnosis not present

## 2021-03-27 DIAGNOSIS — M25562 Pain in left knee: Secondary | ICD-10-CM | POA: Diagnosis not present

## 2021-03-27 DIAGNOSIS — Z0000 Encounter for general adult medical examination without abnormal findings: Secondary | ICD-10-CM

## 2021-03-27 DIAGNOSIS — M25662 Stiffness of left knee, not elsewhere classified: Secondary | ICD-10-CM | POA: Diagnosis not present

## 2021-03-27 DIAGNOSIS — R262 Difficulty in walking, not elsewhere classified: Secondary | ICD-10-CM | POA: Diagnosis not present

## 2021-03-27 NOTE — Patient Instructions (Signed)
[name] , Thank you for taking time to come for your Medicare Wellness Visit. I appreciate your ongoing commitment to your health goals. Please review the following plan we discussed and let me know if I can assist you in the future.   Screening recommendations/referrals: Colonoscopy: aged out Mammogram: 06/25/20, aged out Bone Density: 12/26/12, referral sent Recommended yearly ophthalmology/optometry visit for glaucoma screening and checkup Recommended yearly dental visit for hygiene and checkup  Vaccinations: Influenza vaccine: 12/19/20 Pneumococcal vaccine: 01/18/14 Tdap vaccine: 08/02/13 Shingles vaccine: n/d   Covid-19:05/02/19, 05/23/19, 11/26/19  Advanced directives: yes  Conditions/risks identified: none  Next appointment: Follow up in one year for your annual wellness visit 04/02/22 @ 10:20am in person   Preventive Care 48 Years and Older, Female Preventive care refers to lifestyle choices and visits with your health care provider that can promote health and wellness. What does preventive care include? A yearly physical exam. This is also called an annual well check. Dental exams once or twice a year. Routine eye exams. Ask your health care provider how often you should have your eyes checked. Personal lifestyle choices, including: Daily care of your teeth and gums. Regular physical activity. Eating a healthy diet. Avoiding tobacco and drug use. Limiting alcohol use. Practicing safe sex. Taking low-dose aspirin every day. Taking vitamin and mineral supplements as recommended by your health care provider. What happens during an annual well check? The services and screenings done by your health care provider during your annual well check will depend on your age, overall health, lifestyle risk factors, and family history of disease. Counseling  Your health care provider may ask you questions about your: Alcohol use. Tobacco use. Drug use. Emotional well-being. Home  and relationship well-being. Sexual activity. Eating habits. History of falls. Memory and ability to understand (cognition). Work and work Statistician. Reproductive health. Screening  You may have the following tests or measurements: Height, weight, and BMI. Blood pressure. Lipid and cholesterol levels. These may be checked every 5 years, or more frequently if you are over 25 years old. Skin check. Lung cancer screening. You may have this screening every year starting at age 80 if you have a 30-pack-year history of smoking and currently smoke or have quit within the past 15 years. Fecal occult blood test (FOBT) of the stool. You may have this test every year starting at age 97. Flexible sigmoidoscopy or colonoscopy. You may have a sigmoidoscopy every 5 years or a colonoscopy every 10 years starting at age 69. Hepatitis C blood test. Hepatitis B blood test. Sexually transmitted disease (STD) testing. Diabetes screening. This is done by checking your blood sugar (glucose) after you have not eaten for a while (fasting). You may have this done every 1-3 years. Bone density scan. This is done to screen for osteoporosis. You may have this done starting at age 17. Mammogram. This may be done every 1-2 years. Talk to your health care provider about how often you should have regular mammograms. Talk with your health care provider about your test results, treatment options, and if necessary, the need for more tests. Vaccines  Your health care provider may recommend certain vaccines, such as: Influenza vaccine. This is recommended every year. Tetanus, diphtheria, and acellular pertussis (Tdap, Td) vaccine. You may need a Td booster every 10 years. Zoster vaccine. You may need this after age 3. Pneumococcal 13-valent conjugate (PCV13) vaccine. One dose is recommended after age 39. Pneumococcal polysaccharide (PPSV23) vaccine. One dose is recommended after age 76. Talk  to your health care provider  about which screenings and vaccines you need and how often you need them. This information is not intended to replace advice given to you by your health care provider. Make sure you discuss any questions you have with your health care provider. Document Released: 03/14/2015 Document Revised: 11/05/2015 Document Reviewed: 12/17/2014 Elsevier Interactive Patient Education  2017 [employer] Prevention in the Home Falls can cause injuries. They can happen to people of all ages. There are many things you can do to make your home safe and to help prevent falls. What can I do on the outside of my home? Regularly fix the edges of walkways and driveways and fix any cracks. Remove anything that might make you trip as you walk through a door, such as a raised step or threshold. Trim any bushes or trees on the path to your home. Use bright outdoor lighting. Clear any walking paths of anything that might make someone trip, such as rocks or tools. Regularly check to see if handrails are loose or broken. Make sure that both sides of any steps have handrails. Any raised decks and porches should have guardrails on the edges. Have any leaves, snow, or ice cleared regularly. Use sand or salt on walking paths during winter. Clean up any spills in your garage right away. This includes oil or grease spills. What can I do in the bathroom? Use night lights. Install grab bars by the toilet and in the tub and shower. Do not use towel bars as grab bars. Use non-skid mats or decals in the tub or shower. If you need to sit down in the shower, use a plastic, non-slip stool. Keep the floor dry. Clean up any water that spills on the floor as soon as it happens. Remove soap buildup in the tub or shower regularly. Attach bath mats securely with double-sided non-slip rug tape. Do not have throw rugs and other things on the floor that can make you trip. What can I do in the bedroom? Use night lights. Make sure  that you have a light by your bed that is easy to reach. Do not use any sheets or blankets that are too big for your bed. They should not hang down onto the floor. Have a firm chair that has side arms. You can use this for support while you get dressed. Do not have throw rugs and other things on the floor that can make you trip. What can I do in the kitchen? Clean up any spills right away. Avoid walking on wet floors. Keep items that you use a lot in easy-to-reach places. If you need to reach something above you, use a strong step stool that has a grab bar. Keep electrical cords out of the way. Do not use floor polish or wax that makes floors slippery. If you must use wax, use non-skid floor wax. Do not have throw rugs and other things on the floor that can make you trip. What can I do with my stairs? Do not leave any items on the stairs. Make sure that there are handrails on both sides of the stairs and use them. Fix handrails that are broken or loose. Make sure that handrails are as long as the stairways. Check any carpeting to make sure that it is firmly attached to the stairs. Fix any carpet that is loose or worn. Avoid having throw rugs at the top or bottom of the stairs. If you do have throw rugs,  attach them to the floor with carpet tape. Make sure that you have a light switch at the top of the stairs and the bottom of the stairs. If you do not have them, ask someone to add them for you. What else can I do to help prevent falls? Wear shoes that: Do not have high heels. Have rubber bottoms. Are comfortable and fit you well. Are closed at the toe. Do not wear sandals. If you use a stepladder: Make sure that it is fully opened. Do not climb a closed stepladder. Make sure that both sides of the stepladder are locked into place. Ask someone to hold it for you, if possible. Clearly mark and make sure that you can see: Any grab bars or handrails. First and last steps. Where the edge of  each step is. Use tools that help you move around (mobility aids) if they are needed. These include: Canes. Walkers. Scooters. Crutches. Turn on the lights when you go into a dark area. Replace any light bulbs as soon as they burn out. Set up your furniture so you have a clear path. Avoid moving your furniture around. If any of your floors are uneven, fix them. If there are any pets around you, be aware of where they are. Review your medicines with your doctor. Some medicines can make you feel dizzy. This can increase your chance of falling. Ask your doctor what other things that you can do to help prevent falls. This information is not intended to replace advice given to you by your health care provider. Make sure you discuss any questions you have with your health care provider. Document Released: 12/12/2008 Document Revised: 07/24/2015 Document Reviewed: 03/22/2014 Elsevier Interactive Patient Education  2017 [employer].

## 2021-03-27 NOTE — Progress Notes (Signed)
Subjective:   [name] is a 76 y.o. female who presents for Medicare Annual (Subsequent) preventive examination.  Review of Systems           Objective:    Today's Vitals   03/27/21 1021  BP: (!) 144/68  Pulse: (!) 59  Temp: 98.5 F (36.9 C)  TempSrc: Oral  SpO2: 98%  Weight: 230 lb 3.2 oz (104.4 kg)  Height: 5\' 7"  (1.702 m)   Body mass index is 36.05 kg/m.  Advanced Directives 12/18/2020 12/08/2020 02/12/2020 11/30/2016 11/30/2016 08/30/2016  Does Patient Have a Medical Advance Directive? Yes Yes Yes No No No  Type of Advance Directive Living will;Healthcare Power of [place];Living will [place];Living will - - -  Does patient want to make changes to medical advance directive? No - Patient declined No - Patient declined - - - -  Copy of [place] in Chart? No - copy requested No - copy requested - - - -  Would patient like information on creating a medical advance directive? - - - Yes (MAU/Ambulatory/Procedural Areas - Information given) - Yes (MAU/Ambulatory/Procedural Areas - Information given)    Current Medications (verified) Outpatient Encounter Medications as of 03/27/2021  Medication Sig   ACCU-CHEK AVIVA PLUS test strip U UTD BID   acetaminophen (TYLENOL) 500 MG tablet Take 1,000 mg by mouth every 6 (six) hours as needed for moderate pain.   amLODipine (NORVASC) 2.5 MG tablet TAKE 1 TABLET BY MOUTH  DAILY   aspirin EC 325 MG tablet Take 1 tablet (325 mg total) by mouth 2 (two) times daily.   bisacodyl (DULCOLAX) 5 MG EC tablet Take 2 tablets (10 mg total) by mouth daily as needed for moderate constipation.   carvedilol (COREG) 3.125 MG tablet TAKE 1 TABLET BY MOUTH  TWICE DAILY   docusate sodium (COLACE) 100 MG capsule Take 1 capsule (100 mg total) by mouth 2 (two) times daily.   ezetimibe (ZETIA) 10 MG tablet TAKE 1 TABLET BY MOUTH  DAILY   FLUoxetine (PROZAC) 10 MG capsule Take 1 capsule  (10 mg total) by mouth daily.   fluticasone (FLONASE) 50 MCG/ACT nasal spray Place 2 sprays into both nostrils daily.   levothyroxine (SYNTHROID) 125 MCG tablet TAKE 1 TABLET BY MOUTH  DAILY BEFORE BREAKFAST   losartan (COZAAR) 100 MG tablet TAKE 1 TABLET BY MOUTH  DAILY   metFORMIN (GLUCOPHAGE-XR) 500 MG 24 hr tablet Take 3 tablets (1,500 mg total) by mouth daily with breakfast.   methocarbamol (ROBAXIN) 500 MG tablet Take 1 tablet (500 mg total) by mouth every 8 (eight) hours as needed for muscle spasms.   nitroGLYCERIN (NITROSTAT) 0.4 MG SL tablet Place 1 tablet (0.4 mg total) under the tongue every 5 (five) minutes as needed for chest pain.   ondansetron (ZOFRAN) 4 MG tablet Take 1 tablet (4 mg total) by mouth every 6 (six) hours as needed for nausea.   oxyCODONE (OXY IR/ROXICODONE) 5 MG immediate release tablet Take 1-2 tablets (5-10 mg total) by mouth every 4 (four) hours as needed for moderate pain (pain score 4-6).   OZEMPIC, 0.25 OR 0.5 MG/DOSE, 2 MG/1.5ML SOPN INJECT 0.5MG  SUBCUTANEOUSLY ONCE WEEKLY AS DIRECTED   rosuvastatin (CRESTOR) 20 MG tablet TAKE 1 TABLET BY MOUTH  DAILY   No facility-administered encounter medications on file as of 03/27/2021.    Allergies (verified) Patient has no known allergies.   History: Past Medical History:  Diagnosis Date  Coronary artery disease    a. 07/2013 STEMI/PCI ([place]): LM nl, LAD 100p (thrombectomy & 3.5x23 Alpine DES), LCX nl, RCA nl, EF 45%.   Depression    Diastolic dysfunction    a.) TTE 11/26/2020 - LVEF 50-55%; G2DD.   Endometrial cancer ([place])    Gravida 3 para 3    Hyperlipidemia    Hypertension    Hypertensive heart disease    Hypothyroid    Ischemic cardiomyopathy    a.) 07/2013 EF 45% @ time of STEMI; b.) 11/2013 Echo: EF 55-60%, mildly dil LA, nl RV fxn. c.) TTE 07/29/2015 - LVEF 55-60%, mild LA and RV dilation. d.) TTE 11/26/2020 - LVEF 50-55%; G2DD.   Menopause    Obstructive sleep apnea on CPAP    Osteoarthritis    ST  elevation myocardial infarction (STEMI) of anterolateral wall ([place]) 08/14/2013   a.) LVEF reduced at 45%; Tx'd with trombectomy and PCI to 100% LAD; 3.5 x 23 mm Xience Alpine DES placed.   T2DM (type 2 diabetes mellitus) ([place])    Thyroid cancer ([place])    a.) s/p total thyroidectomy. b.) on daily levothyroxine.   Past Surgical History:  Procedure Laterality Date   ABDOMINAL HYSTERECTOMY     CATARACT EXTRACTION W/PHACO Left 08/30/2016   Procedure: CATARACT EXTRACTION PHACO AND INTRAOCULAR LENS PLACEMENT ([place]) Left daibetic;  Surgeon: Leandrew Koyanagi, MD;  Location: [place];  Service: Ophthalmology;  Laterality: Left;  Diabetic - insulin and oral meds sleep apnea   CATARACT EXTRACTION W/PHACO Right 10/07/2016   Procedure: CATARACT EXTRACTION PHACO AND INTRAOCULAR LENS PLACEMENT (I[facility]);  Surgeon: Leandrew Koyanagi, MD;  Location: [facility];  Service: Ophthalmology;  Laterality: Right;  Lot# [id number] H [place]: 00:43.3 AP%: 12.2 CDE: 5.28   CORONARY ANGIOPLASTY WITH STENT PLACEMENT Left 08/14/2013   Procedure: CORONARY ANGIOPLASTY WITH STENT PLACEMENT (3.5 x 23 mm Xience Alpine DES to LAD); Location: [facility], [place]; Surgeon: Algernon Huxley, MD   GANGLION CYST EXCISION     LAPAROSCOPIC HYSTERECTOMY     THYROIDECTOMY     TOTAL KNEE ARTHROPLASTY Right 12/18/2020   Procedure: RIGHT TOTAL KNEE ARTHROPLASTY;  Surgeon: Thornton Park, MD;  Location: [facility];  Service: Orthopedics;  Laterality: Right;   Family History  Problem Relation Age of Onset   Heart attack Father    Heart failure Father    Hypertension Father    Hyperlipidemia Father    Heart disease Father 47       CABG    Pulmonary fibrosis Son    Breast cancer Neg Hx    Social History   Socioeconomic History   Marital status: Married    Spouse name: Not on file   Number of children: Not on file   Years of education: Not on file   Highest education level: Not on file  Occupational History    Occupation: retired  Tobacco Use   Smoking status: Never   Smokeless tobacco: Never  Vaping Use   Vaping Use: Never used  Substance and Sexual Activity   Alcohol use: No   Drug use: No   Sexual activity: Not on file  Other Topics Concern   Not on file  Social History Narrative   Not on file   Social Determinants of Health   Financial Resource Strain: Not on file  Food Insecurity: Not on file  Transportation Needs: Not on file  Physical Activity: Not on file  Stress: Not on file  Social Connections: Not on file  Tobacco Counseling Counseling given: Not Answered   Clinical Intake:  Pre-visit preparation completed: Yes  Pain : No/denies pain     Nutritional Risks: None Diabetes: Yes CBG done?: No Did pt. bring in CBG monitor from home?: No  How often do you need to have someone help you when you read instructions, pamphlets, or other written materials from your doctor or pharmacy?: 1 - Never  Diabetic?yes Nutrition Risk Assessment:  Has the patient had any N/V/D within the last 2 months?  No  Does the patient have any non-healing wounds?  No  Has the patient had any unintentional weight loss or weight gain?  No   Diabetes:  Is the patient diabetic?  Yes  If diabetic, was a CBG obtained today?  No  Did the patient bring in their glucometer from home?  No  How often do you monitor your CBG's? never.   Financial Strains and Diabetes Management:  Are you having any financial strains with the device, your supplies or your medication? No .  Does the patient want to be seen by Chronic Care Management for management of their diabetes?  No  Would the patient like to be referred to a Nutritionist or for Diabetic Management?  No   Diabetic Exams:  Diabetic Eye Exam: Completed 04/22/20.   Diabetic Foot Exam: Completed 07/01/20. Pt has been advised about the importance in completing this exam.     Interpreter Needed?: No  Information entered by :: Kirke Shaggy,  LPN   Activities of Daily Living In your present state of health, do you have any difficulty performing the following activities: 12/18/2020 12/08/2020  Hearing? N -  Vision? N -  Difficulty concentrating or making decisions? N -  Walking or climbing stairs? Y Y  Comment - afraid of falling  Dressing or bathing? N -  Doing errands, shopping? N N  Some recent data might be hidden    Patient Care Team: Olin Hauser, DO as PCP - General (Family Medicine) Rockey Situ Kathlene November, MD as PCP - Cardiology (Cardiology) Leandrew Koyanagi, MD as Referring Physician (Ophthalmology) Minna Merritts, MD as Consulting Physician (Cardiology) Curley Spice, Virl Diamond, RPH-CPP as Pharmacist  Indicate any recent Medical Services you may have received from other than [facility] providers in the past year (date may be approximate).     Assessment:   This is a routine wellness examination for [place].  Hearing/Vision screen No results found.  Dietary issues and exercise activities discussed:     Goals Addressed   None    Depression Screen PHQ 2/9 Scores 07/01/2020 02/12/2020 01/02/2020 07/02/2019 01/01/2019 07/06/2018 01/05/2018  PHQ - 2 Score 1 1 0 0 0 0 0  PHQ- 9 Score 3 - 2 2 2 2  0    Fall Risk Fall Risk  02/12/2020 01/02/2020 07/02/2019 07/06/2018 01/05/2018  Falls in the past year? 0 0 0 0 0  Number falls in past yr: - 0 0 - -  Injury with Fall? - 0 0 - -  Risk for fall due to : Medication side effect - - - -  Risk for fall due to: Comment - - - - -  Follow up Falls evaluation completed;Education provided;Falls prevention discussed Falls evaluation completed Falls evaluation completed Falls evaluation completed -    FALL RISK PREVENTION PERTAINING TO THE HOME:  Any stairs in or around the home? Yes  If so, are there any without handrails? No  Home free of loose throw rugs in walkways, pet beds,  electrical cords, etc? Yes  Adequate lighting in your home to reduce risk of falls? Yes    ASSISTIVE DEVICES UTILIZED TO PREVENT FALLS:  Life alert? No  Use of a cane, walker or w/c? No  Grab bars in the bathroom? Yes  Shower chair or bench in shower? Yes  Elevated toilet seat or a handicapped toilet? Yes   TIMED UP AND GO:  Was the test performed? Yes .  Length of time to ambulate 10 feet: 5 sec.   Gait slow and steady without use of assistive device  Cognitive Function:     6CIT Screen 02/12/2020 11/30/2016  What Year? 0 points 0 points  What month? 0 points 0 points  What time? 0 points 0 points  Count back from 20 0 points 0 points  Months in reverse 0 points 0 points  Repeat phrase 0 points 0 points  Total Score 0 0    Immunizations Immunization History  Administered Date(s) Administered   Fluad Quad(high Dose 65+) 12/19/2020   Influenza Split 12/29/2011   Influenza, High Dose Seasonal PF 11/19/2014, 12/11/2015, 11/30/2016, 12/16/2017   Influenza,inj,Quad PF,6+ Mos 12/20/2012, 12/05/2018   Influenza-Unspecified 12/14/2019   PFIZER(Purple Top)SARS-COV-2 Vaccination 05/02/2019, 05/23/2019, 11/26/2019   PPD Test 01/19/2016   Pneumococcal Conjugate-13 01/18/2014   Pneumococcal Polysaccharide-23 07/30/2004, 12/20/2012   Tdap 08/02/2013    TDAP status: Up to date  Flu Vaccine status: Up to date  Pneumococcal vaccine status: Up to date  Covid-19 vaccine status: Completed vaccines  Qualifies for Shingles Vaccine? Yes   Zostavax completed No   Shingrix Completed?: No.    Education has been provided regarding the importance of this vaccine. Patient has been advised to call insurance company to determine out of pocket expense if they have not yet received this vaccine. Advised may also receive vaccine at local pharmacy or Health Dept. Verbalized acceptance and understanding.  Screening Tests Health Maintenance  Topic Date Due   Zoster Vaccines- Shingrix (1 of 2) Never done   COVID-19 Vaccine (4 - Booster for Pfizer series) 01/21/2020   OPHTHALMOLOGY  EXAM  04/22/2021   HEMOGLOBIN A1C  06/08/2021   FOOT EXAM  07/01/2021   TETANUS/TDAP  08/03/2023   Pneumonia Vaccine 24+ Years old  Completed   INFLUENZA VACCINE  Completed   DEXA SCAN  Completed   Hepatitis C Screening  Completed   HPV VACCINES  Aged Out   Fecal DNA (Cologuard)  Discontinued    Health Maintenance  Health Maintenance Due  Topic Date Due   Zoster Vaccines- Shingrix (1 of 2) Never done   COVID-19 Vaccine (4 - Booster for Pfizer series) 01/21/2020    Colorectal cancer screening: No longer required.   Mammogram status: Completed 06/25/20. Repeat every year  Bone Density status: Completed 12/26/12. Results reflect: Bone density results: NORMAL. Repeat every 5 years.  Lung Cancer Screening: (Low Dose CT Chest recommended if Age 68-80 years, 30 pack-year currently smoking OR have quit w/in 15years.) does not qualify.    Additional Screening:  Hepatitis C Screening: does qualify; Completed 12/20/16  Vision Screening: Recommended annual ophthalmology exams for early detection of glaucoma and other disorders of the eye. Is the patient up to date with their annual eye exam?  Yes  Who is the provider or what is the name of the office in which the patient attends annual eye exams? [facility] If pt is not established with a provider, would they like to be referred to a provider to establish care?  No .   Dental Screening: Recommended annual dental exams for proper oral hygiene  Community Resource Referral / Chronic Care Management: CRR required this visit?  No   CCM required this visit?  No      Plan:     I have personally reviewed and noted the following in the patients chart:   Medical and social history Use of alcohol, tobacco or illicit drugs  Current medications and supplements including opioid prescriptions.  Functional ability and status Nutritional status Physical activity Advanced directives List of other physicians Hospitalizations,  surgeries, and ER visits in previous 12 months Vitals Screenings to include cognitive, depression, and falls Referrals and appointments  In addition, I have reviewed and discussed with patient certain preventive protocols, quality metrics, and best practice recommendations. A written personalized care plan for preventive services as well as general preventive health recommendations were provided to patient.     Dionisio David, LPN   6/72/0947   Nurse Notes: none

## 2021-03-29 ENCOUNTER — Other Ambulatory Visit: Payer: Self-pay | Admitting: Cardiovascular Disease

## 2021-03-29 DIAGNOSIS — I10 Essential (primary) hypertension: Secondary | ICD-10-CM

## 2021-03-30 DIAGNOSIS — R262 Difficulty in walking, not elsewhere classified: Secondary | ICD-10-CM | POA: Diagnosis not present

## 2021-03-30 DIAGNOSIS — M25662 Stiffness of left knee, not elsewhere classified: Secondary | ICD-10-CM | POA: Diagnosis not present

## 2021-03-30 DIAGNOSIS — M25562 Pain in left knee: Secondary | ICD-10-CM | POA: Diagnosis not present

## 2021-04-06 ENCOUNTER — Telehealth: Payer: Self-pay

## 2021-04-06 NOTE — Telephone Encounter (Signed)
Requested the patient last diabatic exam from [facility], Dr. Wallace Going. The pt informed me that she get her eyes examine every six months.

## 2021-04-09 ENCOUNTER — Telehealth: Payer: Self-pay | Admitting: Cardiovascular Disease

## 2021-04-09 NOTE — Telephone Encounter (Signed)
Contacted this afternoon regarding surgical clearance for upcoming total knee replacement.  Contact information left on patient's voicemail to return call at earliest convenience.

## 2021-04-09 NOTE — Telephone Encounter (Signed)
° °  Pre-operative Risk Assessment    Patient Name: [name]  DOB: [birth year] MRN: [id number]      Request for Surgical Clearance    Procedure:   Lt Total Knee Arthroplasty   Date of Surgery:  Clearance 06/16/21                                 Surgeon:  Dr Mack Guise  Surgeon's Group or Practice Name:  [facility]  Phone number:  [phone] Fax number:  [phone]   Type of Clearance Requested:   - Medical    Type of Anesthesia:   Choice   Additional requests/questions:    SignedAce Gins   04/09/2021, 3:56 PM

## 2021-04-10 ENCOUNTER — Other Ambulatory Visit: Payer: Self-pay | Admitting: Orthopedic Surgery

## 2021-04-12 ENCOUNTER — Other Ambulatory Visit: Payer: Self-pay | Admitting: Cardiovascular Disease

## 2021-04-13 DIAGNOSIS — M25562 Pain in left knee: Secondary | ICD-10-CM | POA: Diagnosis not present

## 2021-04-13 DIAGNOSIS — M25662 Stiffness of left knee, not elsewhere classified: Secondary | ICD-10-CM | POA: Diagnosis not present

## 2021-04-13 DIAGNOSIS — R262 Difficulty in walking, not elsewhere classified: Secondary | ICD-10-CM | POA: Diagnosis not present

## 2021-04-16 DIAGNOSIS — M25562 Pain in left knee: Secondary | ICD-10-CM | POA: Diagnosis not present

## 2021-04-16 DIAGNOSIS — R262 Difficulty in walking, not elsewhere classified: Secondary | ICD-10-CM | POA: Diagnosis not present

## 2021-04-16 DIAGNOSIS — M25662 Stiffness of left knee, not elsewhere classified: Secondary | ICD-10-CM | POA: Diagnosis not present

## 2021-04-17 NOTE — Telephone Encounter (Signed)
Patient returning call.

## 2021-04-17 NOTE — Telephone Encounter (Signed)
Spoke with patient, she continues to do well.  She has a routine follow up appointment with Dr. Rockey Situ on 3/28. Preoperative risk stratification can be discussed at that time. I will add this to the appointment notes.

## 2021-04-17 NOTE — Telephone Encounter (Signed)
LMTCB

## 2021-04-23 DIAGNOSIS — R262 Difficulty in walking, not elsewhere classified: Secondary | ICD-10-CM | POA: Diagnosis not present

## 2021-04-23 DIAGNOSIS — M25662 Stiffness of left knee, not elsewhere classified: Secondary | ICD-10-CM | POA: Diagnosis not present

## 2021-04-23 DIAGNOSIS — M25562 Pain in left knee: Secondary | ICD-10-CM | POA: Diagnosis not present

## 2021-04-27 DIAGNOSIS — M25662 Stiffness of left knee, not elsewhere classified: Secondary | ICD-10-CM | POA: Diagnosis not present

## 2021-04-27 DIAGNOSIS — H40003 Preglaucoma, unspecified, bilateral: Secondary | ICD-10-CM | POA: Diagnosis not present

## 2021-04-27 DIAGNOSIS — R262 Difficulty in walking, not elsewhere classified: Secondary | ICD-10-CM | POA: Diagnosis not present

## 2021-04-27 DIAGNOSIS — M25562 Pain in left knee: Secondary | ICD-10-CM | POA: Diagnosis not present

## 2021-04-27 LAB — HM DIABETES EYE EXAM

## 2021-05-01 ENCOUNTER — Other Ambulatory Visit: Payer: Self-pay | Admitting: Cardiovascular Disease

## 2021-05-01 DIAGNOSIS — I10 Essential (primary) hypertension: Secondary | ICD-10-CM

## 2021-05-01 DIAGNOSIS — E1169 Type 2 diabetes mellitus with other specified complication: Secondary | ICD-10-CM

## 2021-05-01 DIAGNOSIS — M25562 Pain in left knee: Secondary | ICD-10-CM | POA: Diagnosis not present

## 2021-05-01 DIAGNOSIS — M25662 Stiffness of left knee, not elsewhere classified: Secondary | ICD-10-CM | POA: Diagnosis not present

## 2021-05-01 DIAGNOSIS — R262 Difficulty in walking, not elsewhere classified: Secondary | ICD-10-CM | POA: Diagnosis not present

## 2021-05-01 DIAGNOSIS — E785 Hyperlipidemia, unspecified: Secondary | ICD-10-CM

## 2021-05-04 DIAGNOSIS — M25562 Pain in left knee: Secondary | ICD-10-CM | POA: Diagnosis not present

## 2021-05-04 DIAGNOSIS — M25662 Stiffness of left knee, not elsewhere classified: Secondary | ICD-10-CM | POA: Diagnosis not present

## 2021-05-04 DIAGNOSIS — R262 Difficulty in walking, not elsewhere classified: Secondary | ICD-10-CM | POA: Diagnosis not present

## 2021-05-07 DIAGNOSIS — M25562 Pain in left knee: Secondary | ICD-10-CM | POA: Diagnosis not present

## 2021-05-07 DIAGNOSIS — M25662 Stiffness of left knee, not elsewhere classified: Secondary | ICD-10-CM | POA: Diagnosis not present

## 2021-05-07 DIAGNOSIS — R262 Difficulty in walking, not elsewhere classified: Secondary | ICD-10-CM | POA: Diagnosis not present

## 2021-05-11 DIAGNOSIS — H26492 Other secondary cataract, left eye: Secondary | ICD-10-CM | POA: Diagnosis not present

## 2021-05-11 DIAGNOSIS — M25562 Pain in left knee: Secondary | ICD-10-CM | POA: Diagnosis not present

## 2021-05-11 DIAGNOSIS — R262 Difficulty in walking, not elsewhere classified: Secondary | ICD-10-CM | POA: Diagnosis not present

## 2021-05-11 DIAGNOSIS — M25662 Stiffness of left knee, not elsewhere classified: Secondary | ICD-10-CM | POA: Diagnosis not present

## 2021-05-14 DIAGNOSIS — R262 Difficulty in walking, not elsewhere classified: Secondary | ICD-10-CM | POA: Diagnosis not present

## 2021-05-14 DIAGNOSIS — M25662 Stiffness of left knee, not elsewhere classified: Secondary | ICD-10-CM | POA: Diagnosis not present

## 2021-05-14 DIAGNOSIS — M25562 Pain in left knee: Secondary | ICD-10-CM | POA: Diagnosis not present

## 2021-05-18 DIAGNOSIS — M25662 Stiffness of left knee, not elsewhere classified: Secondary | ICD-10-CM | POA: Diagnosis not present

## 2021-05-18 DIAGNOSIS — M25562 Pain in left knee: Secondary | ICD-10-CM | POA: Diagnosis not present

## 2021-05-18 DIAGNOSIS — R262 Difficulty in walking, not elsewhere classified: Secondary | ICD-10-CM | POA: Diagnosis not present

## 2021-05-19 ENCOUNTER — Other Ambulatory Visit: Payer: Self-pay | Admitting: Cardiovascular Disease

## 2021-05-21 DIAGNOSIS — R262 Difficulty in walking, not elsewhere classified: Secondary | ICD-10-CM | POA: Diagnosis not present

## 2021-05-21 DIAGNOSIS — M25562 Pain in left knee: Secondary | ICD-10-CM | POA: Diagnosis not present

## 2021-05-21 DIAGNOSIS — M25662 Stiffness of left knee, not elsewhere classified: Secondary | ICD-10-CM | POA: Diagnosis not present

## 2021-05-23 ENCOUNTER — Encounter: Payer: Self-pay | Admitting: Family Medicine

## 2021-05-25 DIAGNOSIS — R262 Difficulty in walking, not elsewhere classified: Secondary | ICD-10-CM | POA: Diagnosis not present

## 2021-05-25 DIAGNOSIS — M25562 Pain in left knee: Secondary | ICD-10-CM | POA: Diagnosis not present

## 2021-05-25 DIAGNOSIS — M25662 Stiffness of left knee, not elsewhere classified: Secondary | ICD-10-CM | POA: Diagnosis not present

## 2021-05-25 NOTE — Progress Notes (Signed)
Cardiology Office Note ? ?Date:  05/26/2021  ? ?ID:  [name], DOB [birth year], MRN [id number] ? ?PCP:  Olin Hauser, DO  ? ?Chief Complaint  ?Patient presents with  ? 6 month follow up   ?  Cardiac clearance for a total left knee replacement; scheduled for June 16, 2021 with Dr. Audrie Gallus. "Doing well." Medications reviewed by the patient verbally.   ? ? ?HPI:  ?Ms. Schipani is a pleasant 76 year old woman with past medical history of ?CAD ?June 2015, anterior ST elevation MI in [place]  ?thrombectomy and drug-eluting stent placement to the LAD.  ?EF 45%  ?echocardiogram in October 2015 showed normalization of LV function, at 55-60%.  ?hypertension, ?hyperlipidemia,  ?diabetes ?morbid obesity ?son passed in December 2016. ?She presents for follow-up of her coronary artery disease ? ?Last seen in clinic 11/21 ? ?Needs knee surgery on left ?Completed total knee on right ?Scheduled April 18th ? ?Little lightheaded when laying down ?When applying CPAP, goes away quickly ?Sx for past 3-4 months ?Sinus congestion ? ?Husband with dementia, significant stress ?Died last year ? ?No regular exercise program ?Denies chest pain or shortness of breath concerning for angina ? ?A1C 7.3 ? ?EKG personally reviewed by myself on todays visit ?NSR rate 68 bpm poor R wave progression,  ?No change from prior EKG ? ?Other lab work reviewed ? total cholesterol 115 LDL 43 ? ? ?PMH:   has a past medical history of Coronary artery disease, Depression, Diastolic dysfunction, Endometrial cancer ([place]), Gravida 3 para 3, Hyperlipidemia, Hypertension, Hypertensive heart disease, Hypothyroid, Ischemic cardiomyopathy, Menopause, Obstructive sleep apnea on CPAP, Osteoarthritis, ST elevation myocardial infarction (STEMI) of anterolateral wall ([place]) (08/14/2013), T2DM (type 2 diabetes mellitus) ([place]), and Thyroid cancer ([place]). ? ?PSH:    ?Past Surgical History:  ?Procedure Laterality Date  ? ABDOMINAL HYSTERECTOMY    ?  CATARACT EXTRACTION W/PHACO Left 08/30/2016  ? Procedure: CATARACT EXTRACTION PHACO AND INTRAOCULAR LENS PLACEMENT ([place]) Left daibetic;  Surgeon: Leandrew Koyanagi, MD;  Location: [place];  Service: Ophthalmology;  Laterality: Left;  Diabetic - insulin and oral meds ?sleep apnea  ? CATARACT EXTRACTION W/PHACO Right 10/07/2016  ? Procedure: CATARACT EXTRACTION PHACO AND INTRAOCULAR LENS PLACEMENT (I[facility]);  Surgeon: Leandrew Koyanagi, MD;  Location: [facility];  Service: Ophthalmology;  Laterality: Right;  Lot# [id number] H ?[place]: 00:43.3 ?AP%: 12.2 ?CDE: 5.28  ? CORONARY ANGIOPLASTY WITH STENT PLACEMENT Left 08/14/2013  ? Procedure: CORONARY ANGIOPLASTY WITH STENT PLACEMENT (3.5 x 23 mm Xience Alpine DES to LAD); Location: [facility] [place]; Surgeon: Algernon Huxley, MD  ? GANGLION CYST EXCISION    ? LAPAROSCOPIC HYSTERECTOMY    ? THYROIDECTOMY    ? TOTAL KNEE ARTHROPLASTY Right 12/18/2020  ? Procedure: RIGHT TOTAL KNEE ARTHROPLASTY;  Surgeon: Thornton Park, MD;  Location: [facility];  Service: Orthopedics;  Laterality: Right;  ? ? ?Current Outpatient Medications  ?Medication Sig Dispense Refill  ? ACCU-CHEK AVIVA PLUS test strip U UTD BID 100 each 12  ? acetaminophen (TYLENOL) 500 MG tablet Take 1,000 mg by mouth every 6 (six) hours as needed for moderate pain.    ? amLODipine (NORVASC) 2.5 MG tablet TAKE 1 TABLET BY MOUTH DAILY 90 tablet 0  ? aspirin EC 81 MG tablet Take 81 mg by mouth daily. Swallow whole.    ? carvedilol (COREG) 3.125 MG tablet TAKE 1 TABLET BY MOUTH TWICE  DAILY 180 tablet 0  ? ezetimibe (ZETIA) 10 MG tablet TAKE 1 TABLET BY MOUTH DAILY  90 tablet 0  ? FLUoxetine (PROZAC) 10 MG capsule Take 1 capsule (10 mg total) by mouth daily. 90 capsule 3  ? fluticasone (FLONASE) 50 MCG/ACT nasal spray Place 2 sprays into both nostrils daily. 16 g 0  ? levothyroxine (SYNTHROID) 125 MCG tablet TAKE 1 TABLET BY MOUTH  DAILY BEFORE BREAKFAST 90 tablet 3  ? losartan (COZAAR) 100 MG tablet  TAKE 1 TABLET BY MOUTH  DAILY 90 tablet 0  ? metFORMIN (GLUCOPHAGE-XR) 500 MG 24 hr tablet Take 3 tablets (1,500 mg total) by mouth daily with breakfast. 270 tablet 3  ? nitroGLYCERIN (NITROSTAT) 0.4 MG SL tablet Place 1 tablet (0.4 mg total) under the tongue every 5 (five) minutes as needed for chest pain. 30 tablet 11  ? OZEMPIC, 0.25 OR 0.5 MG/DOSE, 2 MG/1.5ML SOPN INJECT 0.'5MG'$  SUBCUTANEOUSLY ONCE WEEKLY AS DIRECTED 4.5 mL 3  ? rosuvastatin (CRESTOR) 20 MG tablet TAKE 1 TABLET BY MOUTH DAILY 90 tablet 0  ? aspirin EC 325 MG tablet Take 1 tablet (325 mg total) by mouth 2 (two) times daily. (Patient not taking: Reported on 03/27/2021) 90 tablet 0  ? bisacodyl (DULCOLAX) 5 MG EC tablet Take 2 tablets (10 mg total) by mouth daily as needed for moderate constipation. (Patient not taking: Reported on 03/27/2021) 30 tablet 0  ? docusate sodium (COLACE) 100 MG capsule Take 1 capsule (100 mg total) by mouth 2 (two) times daily. (Patient not taking: Reported on 03/27/2021) 10 capsule 0  ? methocarbamol (ROBAXIN) 500 MG tablet Take 1 tablet (500 mg total) by mouth every 8 (eight) hours as needed for muscle spasms. (Patient not taking: Reported on 05/26/2021) 30 tablet 0  ? ondansetron (ZOFRAN) 4 MG tablet Take 1 tablet (4 mg total) by mouth every 6 (six) hours as needed for nausea. (Patient not taking: Reported on 03/27/2021) 20 tablet 0  ? oxyCODONE (OXY IR/ROXICODONE) 5 MG immediate release tablet Take 1-2 tablets (5-10 mg total) by mouth every 4 (four) hours as needed for moderate pain (pain score 4-6). (Patient not taking: Reported on 03/27/2021) 30 tablet 0  ? ?No current facility-administered medications for this visit.  ? ? ?Allergies:   Patient has no known allergies.  ? ?Social History:  The patient  reports that she has never smoked. She has never used smokeless tobacco. She reports that she does not drink alcohol and does not use drugs.  ? ?Family History:   family history includes Heart attack in her father; Heart  disease (age of onset: 2) in her father; Heart failure in her father; Hyperlipidemia in her father; Hypertension in her father; Pulmonary fibrosis in her son.  ? ? ?Review of Systems: ?Review of Systems  ?Constitutional: Negative.   ?HENT: Negative.    ?Respiratory: Negative.    ?Cardiovascular: Negative.   ?Gastrointestinal: Negative.   ?Musculoskeletal: Negative.   ?Neurological: Negative.   ?Psychiatric/Behavioral: Negative.    ?All other systems reviewed and are negative. ? ?PHYSICAL EXAM: ?VS:  BP 118/60 (BP Location: Left Arm, Patient Position: Sitting, Cuff Size: Normal)   Pulse 68   Ht '5\' 7"'$  (1.702 m)   Wt 228 lb 4 oz (103.5 kg)   SpO2 98%   BMI 35.75 kg/m?  , BMI Body mass index is 35.75 kg/m?Marland Kitchen ?Constitutional:  oriented to person, place, and time. No distress.  ?HENT:  ?Head: Grossly normal ?Eyes:  no discharge. No scleral icterus.  ?Neck: No JVD, no carotid bruits  ?Cardiovascular: Regular rate and rhythm, no murmurs appreciated ?Pulmonary/Chest: Clear  to auscultation bilaterally, no wheezes or rails ?Abdominal: Soft.  no distension.  no tenderness.  ?Musculoskeletal: Normal range of motion ?Neurological:  normal muscle tone. Coordination normal. No atrophy ?Skin: Skin warm and dry ?Psychiatric: normal affect, pleasant ? ? ?Recent Labs: ?06/24/2020: TSH 1.22 ?12/19/2020: BUN 20; Creatinine, Ser 0.94; Hemoglobin 10.1; Platelets 173; Potassium 4.3; Sodium 131  ? ? ?Lipid Panel ?Lab Results  ?Component Value Date  ? CHOL 115 12/26/2019  ? HDL 57 12/26/2019  ? [place] 43 12/26/2019  ? TRIG 71 12/26/2019  ? ? ? ?Wt Readings from Last 3 Encounters:  ?05/26/21 228 lb 4 oz (103.5 kg)  ?03/27/21 230 lb 3.2 oz (104.4 kg)  ?12/18/20 223 lb 15.8 oz (101.6 kg)  ?  ? ?ASSESSMENT AND PLAN: ? ?Preop cardiovascular eval for knee ?Cleared, Acceptable risk, no further testing needed ? ?Coronary artery disease of native artery of native heart with stable angina pectoris ([place]) -  ?Currently with no symptoms of angina. No  further workup at this time. Continue current medication regimen. ? ?Essential (primary) hypertension - Plan: EKG 12-Lead ?Blood pressure is well controlled on today's visit. No changes made to the medications. ?

## 2021-05-26 ENCOUNTER — Encounter: Payer: Self-pay | Admitting: Cardiovascular Disease

## 2021-05-26 ENCOUNTER — Other Ambulatory Visit: Payer: Self-pay

## 2021-05-26 ENCOUNTER — Ambulatory Visit: Payer: Medicare Other | Admitting: Cardiovascular Disease

## 2021-05-26 VITALS — BP 118/60 | HR 68 | Ht 67.0 in | Wt 228.2 lb

## 2021-05-26 DIAGNOSIS — Z794 Long term (current) use of insulin: Secondary | ICD-10-CM

## 2021-05-26 DIAGNOSIS — I10 Essential (primary) hypertension: Secondary | ICD-10-CM

## 2021-05-26 DIAGNOSIS — E1169 Type 2 diabetes mellitus with other specified complication: Secondary | ICD-10-CM | POA: Diagnosis not present

## 2021-05-26 DIAGNOSIS — Z6841 Body Mass Index (BMI) 40.0 and over, adult: Secondary | ICD-10-CM

## 2021-05-26 DIAGNOSIS — I255 Ischemic cardiomyopathy: Secondary | ICD-10-CM

## 2021-05-26 DIAGNOSIS — E785 Hyperlipidemia, unspecified: Secondary | ICD-10-CM

## 2021-05-26 DIAGNOSIS — E1159 Type 2 diabetes mellitus with other circulatory complications: Secondary | ICD-10-CM

## 2021-05-26 DIAGNOSIS — N1831 Chronic kidney disease, stage 3a: Secondary | ICD-10-CM | POA: Diagnosis not present

## 2021-05-26 DIAGNOSIS — I25118 Atherosclerotic heart disease of native coronary artery with other forms of angina pectoris: Secondary | ICD-10-CM | POA: Diagnosis not present

## 2021-05-26 NOTE — Patient Instructions (Addendum)
Medication Instructions:  Your physician recommends that you continue on your current medications as directed. Please refer to the Current Medication list given to you today.  If you need a refill on your cardiac medications before your next appointment, please call your pharmacy.   Lab work: No new labs needed  Testing/Procedures: No new testing needed  Follow-Up: At [facility], you and your health needs are our priority.  As part of our continuing mission to provide you with exceptional heart care, we have created designated Provider Care Teams.  These Care Teams include your primary Cardiologist (physician) and Advanced Practice Providers (APPs -  Physician Assistants and Nurse Practitioners) who all work together to provide you with the care you need, when you need it.  You will need a follow up appointment in 12 months  Providers on your designated Care Team:   Christopher Berge, NP Ryan Dunn, PA-C Cadence Furth, PA-C  COVID-19 Vaccine Information can be found at: [email or web address] For questions related to vaccine distribution or appointments, please email [email or web address] or call [phone].   

## 2021-05-28 LAB — HM DIABETES EYE EXAM

## 2021-05-29 DIAGNOSIS — M25562 Pain in left knee: Secondary | ICD-10-CM | POA: Diagnosis not present

## 2021-05-29 DIAGNOSIS — M25662 Stiffness of left knee, not elsewhere classified: Secondary | ICD-10-CM | POA: Diagnosis not present

## 2021-05-29 DIAGNOSIS — R262 Difficulty in walking, not elsewhere classified: Secondary | ICD-10-CM | POA: Diagnosis not present

## 2021-06-01 DIAGNOSIS — R262 Difficulty in walking, not elsewhere classified: Secondary | ICD-10-CM | POA: Diagnosis not present

## 2021-06-01 DIAGNOSIS — M25562 Pain in left knee: Secondary | ICD-10-CM | POA: Diagnosis not present

## 2021-06-01 DIAGNOSIS — M25662 Stiffness of left knee, not elsewhere classified: Secondary | ICD-10-CM | POA: Diagnosis not present

## 2021-06-04 ENCOUNTER — Other Ambulatory Visit: Payer: Self-pay | Admitting: Orthopedic Surgery

## 2021-06-04 ENCOUNTER — Other Ambulatory Visit: Payer: Self-pay

## 2021-06-04 ENCOUNTER — Encounter
Admission: RE | Admit: 2021-06-04 | Discharge: 2021-06-04 | Disposition: A | Discharge status: Home / Self Care | Payer: Medicare Other | Source: Ambulatory Visit | Attending: Surgery | Admitting: Surgery

## 2021-06-04 VITALS — BP 146/69 | HR 65 | Temp 98.0°F | Resp 18 | Ht 67.0 in | Wt 227.3 lb

## 2021-06-04 DIAGNOSIS — F3340 Major depressive disorder, recurrent, in remission, unspecified: Secondary | ICD-10-CM

## 2021-06-04 DIAGNOSIS — Z955 Presence of coronary angioplasty implant and graft: Secondary | ICD-10-CM

## 2021-06-04 DIAGNOSIS — I129 Hypertensive chronic kidney disease with stage 1 through stage 4 chronic kidney disease, or unspecified chronic kidney disease: Secondary | ICD-10-CM | POA: Insufficient documentation

## 2021-06-04 DIAGNOSIS — N183 Chronic kidney disease, stage 3 unspecified: Secondary | ICD-10-CM

## 2021-06-04 DIAGNOSIS — E66811 Obesity, class 1: Secondary | ICD-10-CM

## 2021-06-04 DIAGNOSIS — Z8542 Personal history of malignant neoplasm of other parts of uterus: Secondary | ICD-10-CM

## 2021-06-04 DIAGNOSIS — Z96651 Presence of right artificial knee joint: Secondary | ICD-10-CM

## 2021-06-04 DIAGNOSIS — Z01818 Encounter for other preprocedural examination: Secondary | ICD-10-CM

## 2021-06-04 DIAGNOSIS — Z01812 Encounter for preprocedural laboratory examination: Secondary | ICD-10-CM | POA: Diagnosis not present

## 2021-06-04 DIAGNOSIS — I25118 Atherosclerotic heart disease of native coronary artery with other forms of angina pectoris: Secondary | ICD-10-CM

## 2021-06-04 DIAGNOSIS — E890 Postprocedural hypothyroidism: Secondary | ICD-10-CM

## 2021-06-04 DIAGNOSIS — E785 Hyperlipidemia, unspecified: Secondary | ICD-10-CM | POA: Insufficient documentation

## 2021-06-04 DIAGNOSIS — I252 Old myocardial infarction: Secondary | ICD-10-CM

## 2021-06-04 DIAGNOSIS — E1169 Type 2 diabetes mellitus with other specified complication: Secondary | ICD-10-CM

## 2021-06-04 DIAGNOSIS — I255 Ischemic cardiomyopathy: Secondary | ICD-10-CM

## 2021-06-04 DIAGNOSIS — H9313 Tinnitus, bilateral: Secondary | ICD-10-CM

## 2021-06-04 DIAGNOSIS — G4733 Obstructive sleep apnea (adult) (pediatric): Secondary | ICD-10-CM

## 2021-06-04 DIAGNOSIS — M792 Neuralgia and neuritis, unspecified: Secondary | ICD-10-CM

## 2021-06-04 DIAGNOSIS — E669 Obesity, unspecified: Secondary | ICD-10-CM

## 2021-06-04 LAB — URINALYSIS, ROUTINE W REFLEX MICROSCOPIC
Bilirubin Urine: NEGATIVE
Glucose, UA: NEGATIVE mg/dL
Hgb urine dipstick: NEGATIVE
Ketones, ur: NEGATIVE mg/dL
Nitrite: NEGATIVE
Protein, ur: NEGATIVE mg/dL
Specific Gravity, Urine: 1.02 (ref 1.005–1.030)
pH: 5 (ref 5.0–8.0)

## 2021-06-04 LAB — SURGICAL PCR SCREEN
MRSA, PCR: NEGATIVE
Staphylococcus aureus: POSITIVE — AB

## 2021-06-04 LAB — LIPID PANEL
Cholesterol: 118 mg/dL (ref 0–200)
HDL: 55 mg/dL (ref >40)
LDL Cholesterol: 50 mg/dL (ref 0–99)
Total CHOL/HDL Ratio: 2.1 RATIO
Triglycerides: 65 mg/dL (ref <150)
VLDL: 13 mg/dL (ref 0–40)

## 2021-06-04 LAB — BASIC METABOLIC PANEL
Anion gap: 5 (ref 5–15)
BUN: 21 mg/dL (ref 8–23)
CO2: 26 mmol/L (ref 22–32)
Calcium: 8.9 mg/dL (ref 8.9–10.3)
Chloride: 104 mmol/L (ref 98–111)
Creatinine, Ser: 0.92 mg/dL (ref 0.44–1.00)
GFR, Estimated: >60 mL/min (ref >60)
Glucose, Bld: 160 mg/dL — ABNORMAL HIGH (ref 70–99)
Potassium: 4.4 mmol/L (ref 3.5–5.1)
Sodium: 135 mmol/L (ref 135–145)

## 2021-06-04 LAB — TYPE AND SCREEN
ABO/RH(D): O POS
Antibody Screen: NEGATIVE

## 2021-06-04 NOTE — Pre-Procedure Instructions (Signed)
Cardiac clearance on chart and in Epic from Dr Rockey Situ. ?Acceptable Risk ?

## 2021-06-04 NOTE — Patient Instructions (Addendum)
Your procedure is scheduled on: Tuesday 06/16/21 ?Report to the Registration Desk on the 1st floor of the [facility]. ?To find out your arrival time, please call [phone] between 1PM - 3PM on: Monday 06/15/21 ? ?REMEMBER: ?Instructions that are not followed completely may result in serious medical risk, up to and including death; or upon the discretion of your surgeon and anesthesiologist your surgery may need to be rescheduled. ? ?Do not eat or drink after midnight the night before surgery.  ?No gum chewing, lozengers or hard candies. ? ?TAKE THESE MEDICATIONS THE MORNING OF SURGERY WITH A SIP OF WATER: ?amLODipine (NORVASC) 2.5 MG tablet ?carvedilol (COREG) 3.125 MG tablet ?ezetimibe (ZETIA) 10 MG tablet ?FLUoxetine (PROZAC) 10 MG capsule ?levothyroxine (SYNTHROID) 125 MCG tablet ?rosuvastatin (CRESTOR) 20 MG tablet ? ?Stop your Metformin 2 days prior to surgery. Take your last dose on Saturday 06/13/21. ? ?Patient has been instructed not to stop her 81 mg Aspirin per Dr. Rockey Situ. ? ?One week prior to surgery: ?Stop Anti-inflammatories (NSAIDS) such as Advil, Aleve, Ibuprofen, Motrin, Naproxen, Naprosyn and Aspirin based products such as Excedrin, Goodys Powder, BC Powder. ? ?Stop ANY OVER THE COUNTER supplements until after surgery. ? ?You may however, continue to take Tylenol if needed for pain up until the day of surgery. ? ?No Alcohol for 24 hours before or after surgery. ? ?No Smoking including e-cigarettes for 24 hours prior to surgery.  ?No chewable tobacco products for at least 6 hours prior to surgery.  ?No nicotine patches on the day of surgery. ? ?Do not use any "recreational" drugs for at least a week prior to your surgery.  ?Please be advised that the combination of cocaine and anesthesia may have negative outcomes, up to and including death. ?If you test positive for cocaine, your surgery will be cancelled. ? ?On the morning of surgery brush your teeth with toothpaste and water, you may rinse  your mouth with mouthwash if you wish. ?Do not swallow any toothpaste or mouthwash. ? ?Use CHG wipes as directed on instruction sheet. ? ?Do not wear jewelry, make-up, hairpins, clips or nail polish. ? ?Do not wear lotions, powders, or perfumes.  ? ?Do not shave body from the neck down 48 hours prior to surgery just in case you cut yourself which could leave a site for infection.  ?Also, freshly shaved skin may become irritated if using the CHG soap. ? ?Do not bring valuables to the hospital. [facility] is not responsible for any missing/lost belongings or valuables.  ? ?Bring your C-PAP to the hospital with you in case you may have to spend the night.  ? ?Notify your doctor if there is any change in your medical condition (cold, fever, infection). ? ?Wear comfortable clothing (specific to your surgery type) to the hospital. ? ?If you are being admitted to the hospital overnight, leave your suitcase in the car. ?After surgery it may be brought to your room. ? ?If you are taking public transportation, you will need to have a responsible adult (18 years or older) with you. ?Please confirm with your physician that it is acceptable to use public transportation.  ? ?Please call the [facility] Dept. at [phone] if you have any questions about these instructions. ? ?Surgery Visitation Policy: ? ?Patients undergoing a surgery or procedure may have two family members or support persons with them as long as the person is not COVID-19 positive or experiencing its symptoms.  ? ?Inpatient Visitation:   ? ?Visiting hours  are 7 a.m. to 8 p.m. ?Up to four visitors are allowed at one time in a patient room, including children. The visitors may rotate out with other people during the day. One designated support person (adult) may remain overnight.  ?

## 2021-06-05 LAB — HEMOGLOBIN A1C
Hgb A1c MFr Bld: 8.3 % — ABNORMAL HIGH (ref 4.8–5.6)
Mean Plasma Glucose: 192 mg/dL

## 2021-06-08 ENCOUNTER — Telehealth: Payer: Self-pay | Admitting: Family Medicine

## 2021-06-08 NOTE — Telephone Encounter (Signed)
Meaghan calling from [facility] Dr. Thornton Park. Pt was scheduled for surgery on 06/16/21 Calling to report that the patients a1c is elevated at a 8.3. Dr. [place] Mo cut off for surgery 7.5. Pt reports that she had not been taking her truelicity. Wanting to know when the patient can be retested for her A1C? ? ?CB- [phone] ?

## 2021-06-11 ENCOUNTER — Ambulatory Visit (INDEPENDENT_AMBULATORY_CARE_PROVIDER_SITE_OTHER): Payer: Medicare Other | Admitting: Family Medicine

## 2021-06-11 ENCOUNTER — Encounter: Payer: Self-pay | Admitting: Family Medicine

## 2021-06-11 VITALS — BP 130/62 | HR 82 | Ht 67.0 in | Wt 222.0 lb

## 2021-06-11 DIAGNOSIS — E1121 Type 2 diabetes mellitus with diabetic nephropathy: Secondary | ICD-10-CM | POA: Diagnosis not present

## 2021-06-11 MED ORDER — OZEMPIC (1 MG/DOSE) 4 MG/3ML ~~LOC~~ SOPN: 1.0000 mg | PEN_INJECTOR | SUBCUTANEOUS | 1 refills | Status: DC

## 2021-06-11 NOTE — Assessment & Plan Note (Signed)
Elevated A1c up to 8.3 now out of range > 7.5 cannot proceed w ortho surgery TKR ?Complications - CKD-III Hyperlipidemia, Hypothyroidism ?Prior meds: Januvia, insulin ? ?Plan:  ?1. Increase dose Ozempic from 0.5 up to '1mg'$  weekly injection, sample today for enough supply of double dose 0.5 x 2 per week - ordered new rx '1mg'$  dosing ?- Continue Metformin XR '500mg'$  x 3 daily ?2. Encourage improved lifestyle - low carb, low sugar diet, reduce portion size, continue improving regular exercise ?3. Check CBG, bring log to next visit for review ?4. Continue ASA, ARB, Statin ? ?Repeat A1c in 4 weeks. Will f/u with Ortho after ?

## 2021-06-11 NOTE — Progress Notes (Signed)
? ?Subjective:  ? ? Patient ID: [name], female    DOB: Apr 04, 1945, 76 y.o.   MRN: [id number] ? ?[name] is a 76 y.o. female presenting on 06/11/2021 for Diabetes ? ? ?HPI ? ?CHRONIC DM, Type 2  ?Osteoarthritis Left Knee, chronic pain ?Recent Pre-Op for Left Knee Total Arthroplasty (Dr Mack Guise with [facility]) with A1c resulted 8.3 (06/04/21), previously A1c 7.3 to 6.5 within past 1 year. ?- Unfortunately the cut off threshold for proceeding with surgery is < 7.5% therefore, could not proceed with the scheduled knee surgery. ? ?Here today to work on lowering glucose / A1c. She admits only concerns would increased PO intake related some stress or grief eating reported, regarding history of loss of her husband last year. ? ?Meds: ?Currently taking Ozempic 0.'5mg'$  weekly inj, has never tried the one before. She has 3 more doses of current pen left. ? ? ? ? ?  06/11/2021  ?  8:44 AM 03/27/2021  ? 10:29 AM 07/01/2020  ?  9:26 AM  ?Depression screen PHQ 2/9  ?Decreased Interest 0 0 0  ?Down, Depressed, Hopeless 0 0 1  ?PHQ - 2 Score 0 0 1  ?Altered sleeping 1  1  ?Tired, decreased energy 1  1  ?Change in appetite 1  0  ?Feeling bad or failure about yourself  0  0  ?Trouble concentrating 0  0  ?Moving slowly or fidgety/restless 0  0  ?Suicidal thoughts 0  0  ?PHQ-9 Score 3  3  ?Difficult doing work/chores Not difficult at all  Somewhat difficult  ? ? ?Social History  ? ?Tobacco Use  ? Smoking status: Never  ? Smokeless tobacco: Never  ?Vaping Use  ? Vaping Use: Never used  ?Substance Use Topics  ? Alcohol use: No  ? Drug use: No  ? ? ?Review of Systems ?Per HPI unless specifically indicated above ? ?   ?Objective:  ?  ?BP 130/62   Pulse 82   Ht '5\' 7"'$  (1.702 m)   Wt 222 lb (100.7 kg)   SpO2 98%   BMI 34.77 kg/m?   ?Wt Readings from Last 3 Encounters:  ?06/11/21 222 lb (100.7 kg)  ?06/04/21 227 lb 4.8 oz (103.1 kg)  ?05/26/21 228 lb 4 oz (103.5 kg)  ?  ?Physical Exam ?Vitals and nursing note reviewed.   ?Constitutional:   ?   General: She is not in acute distress. ?   Appearance: Normal appearance. She is well-developed. She is not diaphoretic.  ?   Comments: Well-appearing, comfortable, cooperative  ?HENT:  ?   Head: Normocephalic and atraumatic.  ?Eyes:  ?   General:     ?   Right eye: No discharge.     ?   Left eye: No discharge.  ?   Conjunctiva/sclera: Conjunctivae normal.  ?Cardiovascular:  ?   Rate and Rhythm: Normal rate.  ?Pulmonary:  ?   Effort: Pulmonary effort is normal.  ?Skin: ?   General: Skin is warm and dry.  ?   Findings: No erythema or rash.  ?Neurological:  ?   Mental Status: She is alert and oriented to person, place, and time.  ?Psychiatric:     ?   Mood and Affect: Mood normal.     ?   Behavior: Behavior normal.     ?   Thought Content: Thought content normal.  ?   Comments: Well groomed, good eye contact, normal speech and thoughts  ? ? ?Results for  orders placed or performed during the hospital encounter of 06/04/21  ?Surgical PCR Screen  ? Specimen: Nasal Mucosa; Nasal Swab  ?Result Value Ref Range  ? MRSA, PCR NEGATIVE NEGATIVE  ? Staphylococcus aureus POSITIVE (A) NEGATIVE  ?Basic Metabolic Panel  ?Result Value Ref Range  ? Sodium 135 135 - 145 mmol/L  ? Potassium 4.4 3.5 - 5.1 mmol/L  ? Chloride 104 98 - 111 mmol/L  ? CO2 26 22 - 32 mmol/L  ? Glucose, Bld 160 (H) 70 - 99 mg/dL  ? BUN 21 8 - 23 mg/dL  ? Creatinine, Ser 0.92 0.44 - 1.00 mg/dL  ? Calcium 8.9 8.9 - 10.3 mg/dL  ? GFR, Estimated >60 >60 mL/min  ? Anion gap 5 5 - 15  ?Lipid Panel  ?Result Value Ref Range  ? Cholesterol 118 0 - 200 mg/dL  ? Triglycerides 65 <150 mg/dL  ? HDL 55 >40 mg/dL  ? Total CHOL/HDL Ratio 2.1 RATIO  ? VLDL 13 0 - 40 mg/dL  ? LDL Cholesterol 50 0 - 99 mg/dL  ?Hemoglobin A1c  ?Result Value Ref Range  ? Hgb A1c MFr Bld 8.3 (H) 4.8 - 5.6 %  ? Mean Plasma Glucose 192 mg/dL  ?Urinalysis, Routine w reflex microscopic  ?Result Value Ref Range  ? Color, Urine YELLOW (A) YELLOW  ? APPearance HAZY (A) CLEAR  ?  Specific Gravity, Urine 1.020 1.005 - 1.030  ? pH 5.0 5.0 - 8.0  ? Glucose, UA NEGATIVE NEGATIVE mg/dL  ? Hgb urine dipstick NEGATIVE NEGATIVE  ? Bilirubin Urine NEGATIVE NEGATIVE  ? Ketones, ur NEGATIVE NEGATIVE mg/dL  ? Protein, ur NEGATIVE NEGATIVE mg/dL  ? Nitrite NEGATIVE NEGATIVE  ? Leukocytes,Ua LARGE (A) NEGATIVE  ? RBC / HPF 0-5 0 - 5 RBC/hpf  ? WBC, UA 11-20 0 - 5 WBC/hpf  ? Bacteria, UA FEW (A) NONE SEEN  ? Squamous Epithelial / LPF 0-5 0 - 5  ? Mucus PRESENT   ?Type and screen [place]  ?Result Value Ref Range  ? ABO/RH(D) O POS   ? Antibody Screen NEG   ? Sample Expiration 06/18/2021,2359   ? Extend sample reason    ?  NO TRANSFUSIONS OR PREGNANCY IN THE PAST 3 MONTHS ?Performed at [facility], [address], [place], [place] [address] ?  ? ?   ?Assessment & Plan:  ? ?Problem List Items Addressed This Visit   ? ? Controlled type 2 diabetes mellitus with diabetic nephropathy ([place]) - Primary  ?  Elevated A1c up to 8.3 now out of range > 7.5 cannot proceed w ortho surgery TKR ?Complications - CKD-III Hyperlipidemia, Hypothyroidism ?Prior meds: Januvia, insulin ? ?Plan:  ?1. Increase dose Ozempic from 0.5 up to '1mg'$  weekly injection, sample today for enough supply of double dose 0.5 x 2 per week - ordered new rx '1mg'$  dosing ?- Continue Metformin XR '500mg'$  x 3 daily ?2. Encourage improved lifestyle - low carb, low sugar diet, reduce portion size, continue improving regular exercise ?3. Check CBG, bring log to next visit for review ?4. Continue ASA, ARB, Statin ? ?Repeat A1c in 4 weeks. Will f/u with Ortho after ?  ?  ? Relevant Medications  ? OZEMPIC, 1 MG/DOSE, 4 MG/3ML SOPN  ? Other Relevant Orders  ? Hemoglobin A1c  ?  ? ? ?Meds ordered this encounter  ?Medications  ? OZEMPIC, 1 MG/DOSE, 4 MG/3ML SOPN  ?  Sig: Inject 1 mg into the skin once a week.  ?  Dispense:  9 mL  ?  Refill:  1  ?  Dose increase from 0.5 to '1mg'$   ? ? ? ? ?Follow up plan: ?Return in about 4 weeks  (around 07/09/2021) for 4 weeks for lab only A1c. ? ?Future labs ordered for A1c lab ordered. ? ?Nobie Putnam, DO ?[facility] ?[facility] ?06/11/2021, 8:51 AM ?

## 2021-06-11 NOTE — Patient Instructions (Addendum)
Thank you for coming to the office today. ? ?Recent Labs  ?  06/24/20 ?3664 12/08/20 ?4034 06/04/21 ?1058  ?HGBA1C 6.5* 7.3* 8.3*  ? ? ?Dose increase Ozempic from 0.5 up to '1mg'$  weekly inj - you can double dose the 0.'5mg'$  one dose on each side. Should have 3-4 weeks of double dose. New rx sent to [facility] for '1mg'$  weekly, 90 day supply. ? ?Return for a Lab Only visit - for A1c check in 1 month. ? ?See the next page for images describing the [place]. ? ? ? ? ?---------------------------------------------------------------------------------------------------------------------- ? ? ? ? ? ? ?Please schedule a Follow-up Appointment to: Return in about 4 weeks (around 07/09/2021) for 4 weeks for lab only A1c. ? ?If you have any other questions or concerns, please feel free to call the office or send a message through [place]. You may also schedule an earlier appointment if necessary. ? ?Additionally, you may be receiving a survey about your experience at our office within a few days to 1 week by e-mail or mail. We value your feedback. ? ?Nobie Putnam, DO ?[place] ?

## 2021-06-12 ENCOUNTER — Other Ambulatory Visit: Payer: Medicare Other | Attending: Orthopedic Surgery

## 2021-06-15 ENCOUNTER — Other Ambulatory Visit: Payer: Self-pay | Admitting: Cardiovascular Disease

## 2021-06-15 DIAGNOSIS — I10 Essential (primary) hypertension: Secondary | ICD-10-CM

## 2021-06-16 ENCOUNTER — Ambulatory Visit: Admission: RE | Admit: 2021-06-16 | Payer: Medicare Other | Source: Home / Self Care | Admitting: Orthopedic Surgery

## 2021-06-16 ENCOUNTER — Encounter: Admission: RE | Payer: Self-pay | Source: Home / Self Care

## 2021-06-16 DIAGNOSIS — Z01818 Encounter for other preprocedural examination: Secondary | ICD-10-CM

## 2021-06-16 DIAGNOSIS — I25118 Atherosclerotic heart disease of native coronary artery with other forms of angina pectoris: Secondary | ICD-10-CM

## 2021-06-16 SURGERY — ARTHROPLASTY, KNEE, TOTAL
Anesthesia: Choice | Site: Knee | Laterality: Left

## 2021-06-25 ENCOUNTER — Other Ambulatory Visit: Payer: Self-pay | Admitting: Family Medicine

## 2021-06-25 DIAGNOSIS — F3340 Major depressive disorder, recurrent, in remission, unspecified: Secondary | ICD-10-CM

## 2021-06-25 NOTE — Telephone Encounter (Signed)
Requested Prescriptions  ?Pending Prescriptions Disp Refills  ?? FLUoxetine (PROZAC) 10 MG capsule [Pharmacy Med Name: FLUoxetine HCl 10 MG Oral Capsule] 90 capsule 1  ?  Sig: TAKE 1 CAPSULE BY MOUTH  DAILY  ?  ? Psychiatry:  Antidepressants - SSRI Passed - 06/25/2021  5:19 AM  ?  ?  Passed - Completed PHQ-2 or PHQ-9 in the last 360 days  ?  ?  Passed - Valid encounter within last 6 months  ?  Recent Outpatient Visits   ?      ? 2 weeks ago Controlled type 2 diabetes mellitus with diabetic nephropathy, without long-term current use of insulin ([place])  ? [place], DO  ? 11 months ago Controlled type 2 diabetes mellitus with diabetic nephropathy, without long-term current use of insulin ([place])  ? [place], DO  ? 1 year ago Annual physical exam  ? [place], DO  ? 1 year ago Controlled type 2 diabetes mellitus with diabetic nephropathy, without long-term current use of insulin ([place])  ? [place], DO  ? 2 years ago Annual physical exam  ? [place], DO  ?  ?  ? ?  ?  ?  ? ?

## 2021-07-01 ENCOUNTER — Other Ambulatory Visit: Payer: Self-pay | Admitting: Cardiovascular Disease

## 2021-07-13 ENCOUNTER — Other Ambulatory Visit: Payer: Medicare Other

## 2021-07-13 DIAGNOSIS — E1121 Type 2 diabetes mellitus with diabetic nephropathy: Secondary | ICD-10-CM | POA: Diagnosis not present

## 2021-07-14 ENCOUNTER — Encounter: Payer: Self-pay | Admitting: Family Medicine

## 2021-07-14 LAB — HEMOGLOBIN A1C
Hgb A1c MFr Bld: 7.3 % of total Hgb — ABNORMAL HIGH (ref <5.7)
Mean Plasma Glucose: 163 mg/dL
eAG (mmol/L): 9 mmol/L

## 2021-07-25 ENCOUNTER — Other Ambulatory Visit: Payer: Self-pay | Admitting: Cardiovascular Disease

## 2021-07-25 ENCOUNTER — Other Ambulatory Visit: Payer: Self-pay | Admitting: Family Medicine

## 2021-07-25 DIAGNOSIS — I10 Essential (primary) hypertension: Secondary | ICD-10-CM

## 2021-07-25 DIAGNOSIS — E1169 Type 2 diabetes mellitus with other specified complication: Secondary | ICD-10-CM

## 2021-07-25 DIAGNOSIS — E1121 Type 2 diabetes mellitus with diabetic nephropathy: Secondary | ICD-10-CM

## 2021-07-28 NOTE — Telephone Encounter (Signed)
Requested Prescriptions  Pending Prescriptions Disp Refills  . metFORMIN (GLUCOPHAGE-XR) 500 MG 24 hr tablet [Pharmacy Med Name: metFORMIN HCl ER 500 MG Oral Tablet Extended Release 24 Hour] 270 tablet 1    Sig: TAKE 3 TABLETS BY MOUTH  DAILY WITH BREAKFAST     Endocrinology:  Diabetes - Biguanides Failed - 07/25/2021 10:26 PM      Failed - B12 Level in normal range and within 720 days    No results found for: VITAMINB12       Passed - Cr in normal range and within 360 days    Creat  Date Value Ref Range Status  06/24/2020 0.96 (H) 0.60 - 0.93 mg/dL Final    Comment:    For patients >76 years of age, the reference limit for Creatinine is approximately 13% higher for people identified as African-American. .    Creatinine, Ser  Date Value Ref Range Status  06/04/2021 0.92 0.44 - 1.00 mg/dL Final         Passed - HBA1C is between 0 and 7.9 and within 180 days    Hemoglobin A1C  Date Value Ref Range Status  08/10/2018 7.7  Final   Hgb A1c MFr Bld  Date Value Ref Range Status  07/13/2021 7.3 (H) <5.7 % of total Hgb Final    Comment:    For someone without known diabetes, a hemoglobin A1c value of 6.5% or greater indicates that they may have  diabetes and this should be confirmed with a follow-up  test. . For someone with known diabetes, a value <7% indicates  that their diabetes is well controlled and a value  greater than or equal to 7% indicates suboptimal  control. A1c targets should be individualized based on  duration of diabetes, age, comorbid conditions, and  other considerations. . Currently, no consensus exists regarding use of hemoglobin A1c for diagnosis of diabetes for children. .          Passed - eGFR in normal range and within 360 days    GFR, Est African American  Date Value Ref Range Status  06/24/2020 67 > OR = 60 mL/min/1.92m Final   GFR, Est Non African American  Date Value Ref Range Status  06/24/2020 58 (L) > OR = 60 mL/min/1.780mFinal    GFR, Estimated  Date Value Ref Range Status  06/04/2021 >60 >60 mL/min Final    Comment:    (NOTE) Calculated using the CKD-EPI Creatinine Equation (2021)          Passed - Valid encounter within last 6 months    Recent Outpatient Visits          1 month ago Controlled type 2 diabetes mellitus with diabetic nephropathy, without long-term current use of insulin ([facility]  [place]   1 year ago Controlled type 2 diabetes mellitus with diabetic nephropathy, without long-term current use of insulin ([place]  [place]   1 year ago [facility]   2 years ago Controlled type 2 diabetes mellitus with diabetic nephropathy, without long-term current use of insulin ([facility]  [place]ought[place]   2 years ago Annual physical exam   [place]             Passed - CBC within normal limits and completed in  the last 12 months    WBC  Date Value Ref Range Status  12/19/2020 7.6 4.0 - 10.5 K/uL Final   RBC  Date Value Ref Range Status  12/19/2020 3.13 (L) 3.87 - 5.11 MIL/uL Final   Hemoglobin  Date Value Ref Range Status  12/19/2020 10.1 (L) 12.0 - 15.0 g/dL Final   HGB  Date Value Ref Range Status  02/14/2013 11.6 (L) 12.0 - 16.0 g/dL Final   HCT  Date Value Ref Range Status  12/19/2020 30.3 (L) 36.0 - 46.0 % Final  02/06/2013 35.9 35.0 - 47.0 % Final   MCHC  Date Value Ref Range Status  12/19/2020 33.3 30.0 - 36.0 g/dL Final   [facility]  Date Value Ref Range Status  12/19/2020 32.3 26.0 - 34.0 pg Final   MCV  Date Value Ref Range Status  12/19/2020 96.8 80.0 - 100.0 fL Final  02/06/2013 91 80 - 100 fL Final   No results found for: PLTCOUNTKUC, LABPLAT, POCPLA RDW  Date Value Ref Range Status  12/19/2020 11.2 (L) 11.5 - 15.5 % Final   02/06/2013 12.7 11.5 - 14.5 % Final

## 2021-08-16 ENCOUNTER — Other Ambulatory Visit: Payer: Self-pay | Admitting: Cardiovascular Disease

## 2021-09-26 ENCOUNTER — Other Ambulatory Visit: Payer: Self-pay | Admitting: Cardiovascular Disease

## 2021-09-26 DIAGNOSIS — E890 Postprocedural hypothyroidism: Secondary | ICD-10-CM

## 2021-09-28 ENCOUNTER — Encounter: Payer: Self-pay | Admitting: Family Medicine

## 2021-09-28 DIAGNOSIS — R262 Difficulty in walking, not elsewhere classified: Secondary | ICD-10-CM | POA: Diagnosis not present

## 2021-09-28 DIAGNOSIS — M25662 Stiffness of left knee, not elsewhere classified: Secondary | ICD-10-CM | POA: Diagnosis not present

## 2021-09-28 DIAGNOSIS — E1121 Type 2 diabetes mellitus with diabetic nephropathy: Secondary | ICD-10-CM

## 2021-09-28 DIAGNOSIS — M1712 Unilateral primary osteoarthritis, left knee: Secondary | ICD-10-CM | POA: Diagnosis not present

## 2021-09-28 DIAGNOSIS — E890 Postprocedural hypothyroidism: Secondary | ICD-10-CM

## 2021-09-28 NOTE — Telephone Encounter (Signed)
Please advise if ok to refill non cardiac medication. Last filled by Rockey Situ

## 2021-09-28 NOTE — Telephone Encounter (Signed)
Please review.  KP

## 2021-09-29 MED ORDER — LEVOTHYROXINE SODIUM 125 MCG PO TABS: ORAL_TABLET | ORAL | 3 refills | Status: DC

## 2021-10-01 ENCOUNTER — Other Ambulatory Visit: Payer: Medicare Other

## 2021-10-01 DIAGNOSIS — E890 Postprocedural hypothyroidism: Secondary | ICD-10-CM | POA: Diagnosis not present

## 2021-10-01 DIAGNOSIS — E1121 Type 2 diabetes mellitus with diabetic nephropathy: Secondary | ICD-10-CM | POA: Diagnosis not present

## 2021-10-02 DIAGNOSIS — R262 Difficulty in walking, not elsewhere classified: Secondary | ICD-10-CM | POA: Diagnosis not present

## 2021-10-02 DIAGNOSIS — M1712 Unilateral primary osteoarthritis, left knee: Secondary | ICD-10-CM | POA: Diagnosis not present

## 2021-10-02 DIAGNOSIS — M25662 Stiffness of left knee, not elsewhere classified: Secondary | ICD-10-CM | POA: Diagnosis not present

## 2021-10-02 LAB — HEMOGLOBIN A1C
Hgb A1c MFr Bld: 6.6 % of total Hgb — ABNORMAL HIGH (ref <5.7)
Mean Plasma Glucose: 143 mg/dL
eAG (mmol/L): 7.9 mmol/L

## 2021-10-02 LAB — T4, FREE: Free T4: 1.1 ng/dL (ref 0.8–1.8)

## 2021-10-02 LAB — TSH: TSH: 1.73 mIU/L (ref 0.40–4.50)

## 2021-10-09 DIAGNOSIS — M1712 Unilateral primary osteoarthritis, left knee: Secondary | ICD-10-CM | POA: Diagnosis not present

## 2021-10-09 DIAGNOSIS — R262 Difficulty in walking, not elsewhere classified: Secondary | ICD-10-CM | POA: Diagnosis not present

## 2021-10-09 DIAGNOSIS — M25662 Stiffness of left knee, not elsewhere classified: Secondary | ICD-10-CM | POA: Diagnosis not present

## 2021-10-12 DIAGNOSIS — M1712 Unilateral primary osteoarthritis, left knee: Secondary | ICD-10-CM | POA: Diagnosis not present

## 2021-10-13 DIAGNOSIS — M25662 Stiffness of left knee, not elsewhere classified: Secondary | ICD-10-CM | POA: Diagnosis not present

## 2021-10-13 DIAGNOSIS — R262 Difficulty in walking, not elsewhere classified: Secondary | ICD-10-CM | POA: Diagnosis not present

## 2021-10-13 DIAGNOSIS — M1712 Unilateral primary osteoarthritis, left knee: Secondary | ICD-10-CM | POA: Diagnosis not present

## 2021-10-16 DIAGNOSIS — R262 Difficulty in walking, not elsewhere classified: Secondary | ICD-10-CM | POA: Diagnosis not present

## 2021-10-16 DIAGNOSIS — M1712 Unilateral primary osteoarthritis, left knee: Secondary | ICD-10-CM | POA: Diagnosis not present

## 2021-10-16 DIAGNOSIS — M25662 Stiffness of left knee, not elsewhere classified: Secondary | ICD-10-CM | POA: Diagnosis not present

## 2021-10-19 DIAGNOSIS — R262 Difficulty in walking, not elsewhere classified: Secondary | ICD-10-CM | POA: Diagnosis not present

## 2021-10-19 DIAGNOSIS — M25662 Stiffness of left knee, not elsewhere classified: Secondary | ICD-10-CM | POA: Diagnosis not present

## 2021-10-19 DIAGNOSIS — M1712 Unilateral primary osteoarthritis, left knee: Secondary | ICD-10-CM | POA: Diagnosis not present

## 2021-10-26 DIAGNOSIS — M25662 Stiffness of left knee, not elsewhere classified: Secondary | ICD-10-CM | POA: Diagnosis not present

## 2021-10-26 DIAGNOSIS — M1712 Unilateral primary osteoarthritis, left knee: Secondary | ICD-10-CM | POA: Diagnosis not present

## 2021-10-26 DIAGNOSIS — R262 Difficulty in walking, not elsewhere classified: Secondary | ICD-10-CM | POA: Diagnosis not present

## 2021-10-30 DIAGNOSIS — M1712 Unilateral primary osteoarthritis, left knee: Secondary | ICD-10-CM | POA: Diagnosis not present

## 2021-10-30 DIAGNOSIS — M25662 Stiffness of left knee, not elsewhere classified: Secondary | ICD-10-CM | POA: Diagnosis not present

## 2021-10-30 DIAGNOSIS — R262 Difficulty in walking, not elsewhere classified: Secondary | ICD-10-CM | POA: Diagnosis not present

## 2021-11-03 DIAGNOSIS — M1712 Unilateral primary osteoarthritis, left knee: Secondary | ICD-10-CM | POA: Diagnosis not present

## 2021-11-03 DIAGNOSIS — M25662 Stiffness of left knee, not elsewhere classified: Secondary | ICD-10-CM | POA: Diagnosis not present

## 2021-11-03 DIAGNOSIS — R262 Difficulty in walking, not elsewhere classified: Secondary | ICD-10-CM | POA: Diagnosis not present

## 2021-11-06 DIAGNOSIS — R262 Difficulty in walking, not elsewhere classified: Secondary | ICD-10-CM | POA: Diagnosis not present

## 2021-11-06 DIAGNOSIS — M1712 Unilateral primary osteoarthritis, left knee: Secondary | ICD-10-CM | POA: Diagnosis not present

## 2021-11-06 DIAGNOSIS — M25662 Stiffness of left knee, not elsewhere classified: Secondary | ICD-10-CM | POA: Diagnosis not present

## 2021-11-09 DIAGNOSIS — R262 Difficulty in walking, not elsewhere classified: Secondary | ICD-10-CM | POA: Diagnosis not present

## 2021-11-09 DIAGNOSIS — M25662 Stiffness of left knee, not elsewhere classified: Secondary | ICD-10-CM | POA: Diagnosis not present

## 2021-11-09 DIAGNOSIS — M1712 Unilateral primary osteoarthritis, left knee: Secondary | ICD-10-CM | POA: Diagnosis not present

## 2021-11-23 ENCOUNTER — Other Ambulatory Visit: Payer: Self-pay | Admitting: Family Medicine

## 2021-11-23 DIAGNOSIS — E1121 Type 2 diabetes mellitus with diabetic nephropathy: Secondary | ICD-10-CM

## 2021-11-23 NOTE — Telephone Encounter (Signed)
Requested Prescriptions  Pending Prescriptions Disp Refills  . OZEMPIC, 1 MG/DOSE, 4 MG/3ML SOPN [Pharmacy Med Name: Ozempic (1 MG/DOSE) 4 MG/3ML Subcutaneous Solution Pen-injector] 3 mL 0    Sig: INJECT SUBCUTANEOUSLY 1 MG EVERY WEEK     Endocrinology:  Diabetes - GLP-1 Receptor Agonists - semaglutide Failed - 11/23/2021  5:32 AM      Failed - HBA1C in normal range and within 180 days    Hemoglobin A1C  Date Value Ref Range Status  08/10/2018 7.7  Final   Hgb A1c MFr Bld  Date Value Ref Range Status  10/01/2021 6.6 (H) <5.7 % of total Hgb Final    Comment:    For someone without known diabetes, a hemoglobin A1c value of 6.5% or greater indicates that they may have  diabetes and this should be confirmed with a follow-up  test. . For someone with known diabetes, a value <7% indicates  that their diabetes is well controlled and a value  greater than or equal to 7% indicates suboptimal  control. A1c targets should be individualized based on  duration of diabetes, age, comorbid conditions, and  other considerations. . Currently, no consensus exists regarding use of hemoglobin A1c for diagnosis of diabetes for children. .          Passed - Cr in normal range and within 360 days    Creat  Date Value Ref Range Status  06/24/2020 0.96 (H) 0.60 - 0.93 mg/dL Final    Comment:    For patients >66 years of age, the reference limit for Creatinine is approximately 13% higher for people identified as African-American. .    Creatinine, Ser  Date Value Ref Range Status  06/04/2021 0.92 0.44 - 1.00 mg/dL Final         Passed - Valid encounter within last 6 months    Recent Outpatient Visits          5 months ago Controlled type 2 diabetes mellitus with diabetic nephropathy, without long-term current use of insulin ([place])   [place], DO   1 year ago Controlled type 2 diabetes mellitus with diabetic nephropathy, without long-term current  use of insulin ([place])   [place], DO   1 year ago Annual physical exam   [facility] Olin Hauser, DO   2 years ago Controlled type 2 diabetes mellitus with diabetic nephropathy, without long-term current use of insulin ([place])   [place], Devonne Doughty, DO   2 years ago Annual physical exam   [place], Devonne Doughty, DO

## 2021-11-24 DIAGNOSIS — M1712 Unilateral primary osteoarthritis, left knee: Secondary | ICD-10-CM | POA: Diagnosis not present

## 2021-11-24 DIAGNOSIS — R262 Difficulty in walking, not elsewhere classified: Secondary | ICD-10-CM | POA: Diagnosis not present

## 2021-11-24 DIAGNOSIS — M25662 Stiffness of left knee, not elsewhere classified: Secondary | ICD-10-CM | POA: Diagnosis not present

## 2021-11-27 DIAGNOSIS — R262 Difficulty in walking, not elsewhere classified: Secondary | ICD-10-CM | POA: Diagnosis not present

## 2021-11-27 DIAGNOSIS — M1712 Unilateral primary osteoarthritis, left knee: Secondary | ICD-10-CM | POA: Diagnosis not present

## 2021-11-27 DIAGNOSIS — M25662 Stiffness of left knee, not elsewhere classified: Secondary | ICD-10-CM | POA: Diagnosis not present

## 2021-11-30 ENCOUNTER — Telehealth: Payer: Self-pay | Admitting: *Deleted

## 2021-11-30 ENCOUNTER — Encounter
Admission: RE | Admit: 2021-11-30 | Discharge: 2021-11-30 | Disposition: A | Discharge status: Home / Self Care | Payer: Medicare Other | Source: Ambulatory Visit | Attending: Orthopedic Surgery | Admitting: Orthopedic Surgery

## 2021-11-30 ENCOUNTER — Other Ambulatory Visit: Payer: Self-pay | Admitting: Orthopedic Surgery

## 2021-11-30 VITALS — BP 131/69 | HR 68 | Temp 98.0°F | Resp 14 | Ht 67.0 in | Wt 218.0 lb

## 2021-11-30 DIAGNOSIS — H40003 Preglaucoma, unspecified, bilateral: Secondary | ICD-10-CM | POA: Diagnosis not present

## 2021-11-30 DIAGNOSIS — I129 Hypertensive chronic kidney disease with stage 1 through stage 4 chronic kidney disease, or unspecified chronic kidney disease: Secondary | ICD-10-CM | POA: Diagnosis not present

## 2021-11-30 DIAGNOSIS — N183 Chronic kidney disease, stage 3 unspecified: Secondary | ICD-10-CM | POA: Diagnosis not present

## 2021-11-30 DIAGNOSIS — Z01812 Encounter for preprocedural laboratory examination: Secondary | ICD-10-CM

## 2021-11-30 DIAGNOSIS — I25118 Atherosclerotic heart disease of native coronary artery with other forms of angina pectoris: Secondary | ICD-10-CM

## 2021-11-30 DIAGNOSIS — Z01818 Encounter for other preprocedural examination: Secondary | ICD-10-CM

## 2021-11-30 HISTORY — DX: Nausea with vomiting, unspecified: R11.2

## 2021-11-30 HISTORY — DX: Other specified postprocedural states: Z98.890

## 2021-11-30 LAB — BASIC METABOLIC PANEL
Anion gap: 5 (ref 5–15)
BUN: 18 mg/dL (ref 8–23)
CO2: 27 mmol/L (ref 22–32)
Calcium: 9 mg/dL (ref 8.9–10.3)
Chloride: 105 mmol/L (ref 98–111)
Creatinine, Ser: 0.91 mg/dL (ref 0.44–1.00)
GFR, Estimated: >60 mL/min (ref >60)
Glucose, Bld: 122 mg/dL — ABNORMAL HIGH (ref 70–99)
Potassium: 4.1 mmol/L (ref 3.5–5.1)
Sodium: 137 mmol/L (ref 135–145)

## 2021-11-30 LAB — CBC WITH DIFFERENTIAL/PLATELET
Abs Immature Granulocytes: 0.01 10*3/uL (ref 0.00–0.07)
Basophils Absolute: 0 10*3/uL (ref 0.0–0.1)
Basophils Relative: 1 %
Eosinophils Absolute: 0.4 10*3/uL (ref 0.0–0.5)
Eosinophils Relative: 6 %
HCT: 37.6 % (ref 36.0–46.0)
Hemoglobin: 11.7 g/dL — ABNORMAL LOW (ref 12.0–15.0)
Immature Granulocytes: 0 %
Lymphocytes Relative: 28 %
Lymphs Abs: 1.7 10*3/uL (ref 0.7–4.0)
MCH: 30 pg (ref 26.0–34.0)
MCHC: 31.1 g/dL (ref 30.0–36.0)
MCV: 96.4 fL (ref 80.0–100.0)
Monocytes Absolute: 0.5 10*3/uL (ref 0.1–1.0)
Monocytes Relative: 8 %
Neutro Abs: 3.3 10*3/uL (ref 1.7–7.7)
Neutrophils Relative %: 57 %
Platelets: 199 10*3/uL (ref 150–400)
RBC: 3.9 MIL/uL (ref 3.87–5.11)
RDW: 11.8 % (ref 11.5–15.5)
WBC: 5.9 10*3/uL (ref 4.0–10.5)
nRBC: 0 % (ref 0.0–0.2)

## 2021-11-30 LAB — TYPE AND SCREEN
ABO/RH(D): O POS
Antibody Screen: NEGATIVE

## 2021-11-30 LAB — URINALYSIS, ROUTINE W REFLEX MICROSCOPIC
Bilirubin Urine: NEGATIVE
Glucose, UA: NEGATIVE mg/dL
Hgb urine dipstick: NEGATIVE
Ketones, ur: NEGATIVE mg/dL
Nitrite: NEGATIVE
Protein, ur: NEGATIVE mg/dL
Specific Gravity, Urine: 1.016 (ref 1.005–1.030)
Squamous Epithelial / HPF: NONE SEEN (ref 0–5)
pH: 5 (ref 5.0–8.0)

## 2021-11-30 LAB — SURGICAL PCR SCREEN
MRSA, PCR: NEGATIVE
Staphylococcus aureus: POSITIVE — AB

## 2021-11-30 LAB — PROTIME-INR
INR: 1.1 (ref 0.8–1.2)
Prothrombin Time: 14 seconds (ref 11.4–15.2)

## 2021-11-30 MED ORDER — TRANEXAMIC ACID 1000 MG/10ML IV SOLN: 1000.0000 mg | INTRAVENOUS | Status: AC

## 2021-11-30 NOTE — Telephone Encounter (Signed)
-----   Message from Karen Kitchens, NP sent at 11/30/2021 11:48 AM EDT ----- Regarding: Request for pre-operative cardiac clearance Request for pre-operative cardiac clearance:  1. What type of surgery is being performed?  LEFT TOTAL KNEE ARTHROPLASTY  2. When is this surgery scheduled?  12/10/2021  3. Type of clearance being requested (medical, pharmacy, both). BOTH   4. Are there any medications that need to be held prior to surgery? ASA  5. Practice name and name of physician performing surgery?  Performing surgeon: Dr. Thornton Park, MD Requesting clearance: Honor Loh, FNP-C    6. Anesthesia type (none, local, MAC, general)? GENERAL  7. What is the office phone and fax number?   Phone: [phone]  ATTENTION: Unable to create telephone message as per your standard workflow. Directed by [facility] providers to send requests for cardiac clearance to this pool for appropriate distribution to provider covering pre-operative clearances.   Honor Loh, MSN, APRN, FNP-C, CEN [facility]  Peri-operative Services Nurse Practitioner Phone: [phone] 11/30/21 11:48 AM

## 2021-11-30 NOTE — Telephone Encounter (Signed)
Pt has been scheduled for a tele visit, 12/04/21 2:20.  Consent on file / medications reconciled.

## 2021-11-30 NOTE — Telephone Encounter (Signed)
  1. What type of surgery is being performed?  LEFT TOTAL KNEE ARTHROPLASTY   2. When is this surgery scheduled?  12/10/2021     3. Type of clearance being requested (medical, pharmacy, both).  BOTH     4. Are there any medications that need to be held prior to surgery?  ASA   5. Practice name and name of physician performing surgery?  Performing surgeon: Dr. Thornton Park, MD  Requesting clearance: Honor Loh, FNP-C       6. Anesthesia type (none, local, MAC, general)?  GENERAL   7. What is the office phone and fax number?    Phone: [phone]

## 2021-11-30 NOTE — Telephone Encounter (Signed)
Primary Cardiologist:Timothy Rockey Situ, MD   Preoperative team, please contact this patient and set up a phone call appointment for further preoperative risk assessment. Please obtain consent and complete medication review. Thank you for your help.   Ideally aspirin should be continued without interruption, however if the bleeding risk is too great, aspirin may be held for 7 days prior to surgery. Please resume aspirin post operatively when it is felt to be safe from a bleeding standpoint.    Emmaline Life, NP-C  11/30/2021, 12:09 PM [address] Office [phone] Fax [phone]

## 2021-11-30 NOTE — Telephone Encounter (Signed)
Patient is returning call.  °

## 2021-11-30 NOTE — Telephone Encounter (Signed)
1st attempt to reach pt regarding surgical clearance and the need for a tele visit.  Left a message for pt to call back and ask for the preop team. 

## 2021-11-30 NOTE — Patient Instructions (Addendum)
Your procedure is scheduled on: Thursday, October 12 Report to the Registration Desk on the 1st floor of the [facility]. To find out your arrival time, please call [phone] between 1PM - 3PM on: Wednesday, October 11 If your arrival time is 6:00 am, do not arrive prior to that time as the [place] entrance doors do not open until 6:00 am.  REMEMBER: Instructions that are not followed completely may result in serious medical risk, up to and including death; or upon the discretion of your surgeon and anesthesiologist your surgery may need to be rescheduled.  Do not eat food after midnight the night before surgery.  No gum chewing, lozengers or hard candies.  You may however, drink water up to 2 hours before you are scheduled to arrive for your surgery. Do not drink anything within 2 hours of your scheduled arrival time.  TAKE THESE MEDICATIONS THE MORNING OF SURGERY WITH A SIP OF WATER:  Amlodipine Carvedilol Ezetimibe Fluoxetine Levothyroxine Rosuvastatin  Metformin - hold 2 days prior to surgery. Last day to take metformin is Monday, October 9. Resume AFTER surgery.  Ozempic - hold 7 days prior to surgery. Last day to take Ozempic is October 1. Resume on your normal weekly day, October 15.  One week prior to surgery: starting October 5 Stop aspirin and Anti-inflammatories (NSAIDS) such as Advil, Aleve, Ibuprofen, Motrin, Naproxen, Naprosyn and Aspirin based products such as Excedrin, Goodys Powder, BC Powder. Stop ANY OVER THE COUNTER supplements until after surgery. You may however, continue to take Tylenol if needed for pain up until the day of surgery.  No Alcohol for 24 hours before or after surgery.  On the morning of surgery brush your teeth with toothpaste and water, you may rinse your mouth with mouthwash if you wish. Do not swallow any toothpaste or mouthwash.  Use CHG Soap as directed on instruction sheet.  Do not wear jewelry, make-up, hairpins, clips or  nail polish.  Do not wear lotions, powders, or perfumes.   Do not shave body from the neck down 48 hours prior to surgery just in case you cut yourself which could leave a site for infection.  Also, freshly shaved skin may become irritated if using the CHG soap.  Do not bring valuables to the hospital. [facility] is not responsible for any missing/lost belongings or valuables.   Bring your C-PAP to the hospital with you in case you may have to spend the night.   Notify your doctor if there is any change in your medical condition (cold, fever, infection).  Wear comfortable clothing (specific to your surgery type) to the hospital.  After surgery, you can help prevent lung complications by doing breathing exercises.  Take deep breaths and cough every 1-2 hours. Your doctor may order a device called an Incentive Spirometer to help you take deep breaths.  If you are being admitted to the hospital overnight, leave your suitcase in the car. After surgery it may be brought to your room.  If you are being discharged the day of surgery, you will not be allowed to drive home. You will need a responsible adult (18 years or older) to drive you home and stay with you that night.   If you are taking public transportation, you will need to have a responsible adult (18 years or older) with you. Please confirm with your physician that it is acceptable to use public transportation.   Please call the Center Dept. at [phone] if  you have any questions about these instructions.  Surgery Visitation Policy:  Patients undergoing a surgery or procedure may have two family members or support persons with them as long as the person is not COVID-19 positive or experiencing its symptoms.   Inpatient Visitation:    Visiting hours are 7 a.m. to 8 p.m. Up to four visitors are allowed at one time in a patient room, including children. The visitors may rotate out with other people during the  day. One designated support person (adult) may remain overnight.      Preparing for Surgery with CHLORHEXIDINE GLUCONATE (CHG) Soap  Chlorhexidine Gluconate (CHG) Soap  o An antiseptic cleaner that kills germs and bonds with the skin to continue killing germs even after washing  o Used for showering the night before surgery and morning of surgery  Before surgery, you can play an important role by reducing the number of germs on your skin.  CHG (Chlorhexidine gluconate) soap is an antiseptic cleanser which kills germs and bonds with the skin to continue killing germs even after washing.  Please do not use if you have an allergy to CHG or antibacterial soaps. If your skin becomes reddened/irritated stop using the CHG.  1. Shower the NIGHT BEFORE SURGERY and the MORNING OF SURGERY with CHG soap.  2. If you choose to wash your hair, wash your hair first as usual with your normal shampoo.  3. After shampooing, rinse your hair and body thoroughly to remove the shampoo.  4. Use CHG as you would any other liquid soap. You can apply CHG directly to the skin and wash gently with a scrungie or a clean washcloth.  5. Apply the CHG soap to your body only from the neck down. Do not use on open wounds or open sores. Avoid contact with your eyes, ears, mouth, and genitals (private parts). Wash face and genitals (private parts) with your normal soap.  6. Wash thoroughly, paying special attention to the area where your surgery will be performed.  7. Thoroughly rinse your body with warm water.  8. Do not shower/wash with your normal soap after using and rinsing off the CHG soap.  9. Pat yourself dry with a clean towel.  10. Wear clean pajamas to bed the night before surgery.  12. Place clean sheets on your bed the night of your first shower and do not sleep with pets.  13. Shower again with the CHG soap on the day of surgery prior to arriving at the hospital.  14. Do not apply any  deodorants/lotions/powders.  15. Please wear clean clothes to the hospital.

## 2021-11-30 NOTE — Telephone Encounter (Signed)
Pt has been scheduled for a tele visit, 12/04/21 2:20.  Consent on file / medications reconciled.    Patient Consent for Virtual Visit        [name] has provided verbal consent on 11/30/2021 for a virtual visit (video or telephone).   CONSENT FOR VIRTUAL VISIT FOR:  [name]  By participating in this virtual visit I agree to the following:  I hereby voluntarily request, consent and authorize [facility] and its employed or contracted physicians, physician assistants, nurse practitioners or other licensed health care professionals (the Practitioner), to provide me with telemedicine health care services (the "Services") as deemed necessary by the treating Practitioner. I acknowledge and consent to receive the Services by the Practitioner via telemedicine. I understand that the telemedicine visit will involve communicating with the Practitioner through live audiovisual communication technology and the disclosure of certain medical information by electronic transmission. I acknowledge that I have been given the opportunity to request an in-person assessment or other available alternative prior to the telemedicine visit and am voluntarily participating in the telemedicine visit.  I understand that I have the right to withhold or withdraw my consent to the use of telemedicine in the course of my care at any time, without affecting my right to future care or treatment, and that the Practitioner or I may terminate the telemedicine visit at any time. I understand that I have the right to inspect all information obtained and/or recorded in the course of the telemedicine visit and may receive copies of available information for a reasonable fee.  I understand that some of the potential risks of receiving the Services via telemedicine include:  Delay or interruption in medical evaluation due to technological equipment failure or disruption; Information transmitted may not be  sufficient (e.g. poor resolution of images) to allow for appropriate medical decision making by the Practitioner; and/or  In rare instances, security protocols could fail, causing a breach of personal health information.  Furthermore, I acknowledge that it is my responsibility to provide information about my medical history, conditions and care that is complete and accurate to the best of my ability. I acknowledge that Practitioner's advice, recommendations, and/or decision may be based on factors not within their control, such as incomplete or inaccurate data provided by me or distortions of diagnostic images or specimens that may result from electronic transmissions. I understand that the practice of medicine is not an exact science and that Practitioner makes no warranties or guarantees regarding treatment outcomes. I acknowledge that a copy of this consent can be made available to me via my patient portal ([facility]), or I can request a printed copy by calling the office of [facility].    I understand that my insurance will be billed for this visit.   I have read or had this consent read to me. I understand the contents of this consent, which adequately explains the benefits and risks of the Services being provided via telemedicine.  I have been provided ample opportunity to ask questions regarding this consent and the Services and have had my questions answered to my satisfaction. I give my informed consent for the services to be provided through the use of telemedicine in my medical care

## 2021-12-01 DIAGNOSIS — R262 Difficulty in walking, not elsewhere classified: Secondary | ICD-10-CM | POA: Diagnosis not present

## 2021-12-01 DIAGNOSIS — M25662 Stiffness of left knee, not elsewhere classified: Secondary | ICD-10-CM | POA: Diagnosis not present

## 2021-12-01 DIAGNOSIS — M1712 Unilateral primary osteoarthritis, left knee: Secondary | ICD-10-CM | POA: Diagnosis not present

## 2021-12-02 NOTE — Progress Notes (Signed)
  Perioperative Services Pre-Admission/Anesthesia Testing    Date: 12/02/21  Name: [name] MRN:   [id number]  Re: GLP-1 clearance and provider recommendations   Planned Surgical Procedure(s):    Case: [id number] Date/Time: 12/10/21 0730   Procedure: TOTAL KNEE ARTHROPLASTY (Left: Knee)   Anesthesia type: Choice   Pre-op diagnosis: Left Knee Osteoarthritis   Location: [facility] OR ROOM 02 / [facility]   Surgeons: Thornton Park, MD   Clinical Notes:  Patient is scheduled for the above procedure on 12/10/2021 with Dr. Thornton Park, MD. In review of her medication reconciliation it was noted that patient is on a prescribed GLP-1 medication. Per guidelines issued by the American Society of Anesthesiologists (ASA), it is recommended that these medications be held for 7 days prior to the patient undergoing any type of elective surgical procedure. The patient is taking the following GLP-1 medication:  '[x]'$  SEMAGLUTIDE (Ozempic, Rybelsus)  '[]'$  EXENATIDE (Bydureon, Byetta) '[]'$  LIRAGLUTIDE (Victoza, Saxenda)  '[]'$  LIXISENATIDE (Adalyxin) '[]'$  DULAGLUTIDE (Trulicity)    '[]'$  OTHER GLP-1 medication: _______________  Reached out to prescribing provider Parks Ranger, MD) to make them aware of the guidelines from anesthesia. Given that this patient takes the prescribed GLP-1 medication for her  diabetes diagnosis, rather than for weight loss, recommendations from the prescribing provider were solicited. Prescribing provider made aware of the following so that informed decision/POC can be developed for this patient that may be taking medications belonging to these drug classes:  Oral GLP-1 medications will be held 1 day prior to surgery.  Injectable GLP-1 medications will be held 7 days prior to surgery.  Metformin is routinely held 48 hours prior to surgery due to renal concerns, potential need for contrasted imaging perioperatively, and the potential for tissue hypoxia leading to  drug induced lactic acidosis.  All SGLT2i medications are held 72 hours prior to surgery as they can be associated with the increased potential for developing euglycemic diabetic ketoacidosis (EDKA).   Impression and Plan:  [name] is on a prescribed GLP-1 medication, which induces the known side effect of decreased gastric emptying. Efforts are bring made to mitigate the risk of perioperative hyperglycemic events, as elevated blood glucose levels have been found to contribute to intra/postoperative complications. Additionally, hyperglycemic extremes can potentially necessitate the postponing of a patient's elective case in order to better optimize perioperative glycemic control, again with the aforementioned guidelines in place. With this in mind, recommendations have been sought from the prescribing provider, who has cleared patient to proceed with holding the prescribed GLP-1 as per the guidelines from the ASA.   Provider recommending: no further recommendations received from the prescribing provider.  Copy of signed clearance and recommendations placed on patient's chart for inclusion in their medical record and for review by the surgical/anesthetic team on the day of her procedure.   Honor Loh, MSN, APRN, FNP-C, CEN [facility]  Peri-operative Services Nurse Practitioner Phone: [phone] 12/02/21 11:49 AM  NOTE: This note has been prepared using Dragon dictation software. Despite my best ability to proofread, there is always the potential that unintentional transcriptional errors may still occur from this process.

## 2021-12-03 NOTE — Progress Notes (Signed)
Perioperative Services  Pre-Admission/Anesthesia Testing Clinical Review  Date: 12/04/21  Patient Demographics:  Name: [name] DOB:   05/02/1945 MRN:   [id number]  Planned Surgical Procedure(s):    Case: [id number] Date/Time: 12/10/21 0730   Procedure: TOTAL KNEE ARTHROPLASTY (Left: Knee)   Anesthesia type: Choice   Pre-op diagnosis: Left Knee Osteoarthritis   Location: [facility] OR ROOM 02 / [facility]   Surgeons: Thornton Park, MD   NOTE: Available PAT nursing documentation and vital signs have been reviewed. Clinical nursing staff has updated patient's PMH/PSHx, current medication list, and drug allergies/intolerances to ensure comprehensive history available to assist in medical decision making as it pertains to the aforementioned surgical procedure and anticipated anesthetic course. Extensive review of available clinical information performed. [place] PMH and PSHx updated with any diagnoses/procedures that  may have been inadvertently omitted during her intake with the pre-admission testing department's nursing staff.  Clinical Discussion:  [name] is a 76 y.o. female who is submitted for pre-surgical anesthesia review and clearance prior to her undergoing the above procedure. Patient has never been a smoker. Pertinent PMH includes: CAD, STEMI, ischemic cardiomyopathy, diastolic dysfunction, HTN, HLD, T2DM, thyroid cancer (s/p thyroidectomy), OSAH (requires nocturnal PAP therapy), OA, depression.  Patient is followed by cardiology Rockey Situ, MD). She was last seen in the cardiology clinic on 05/26/2021; notes reviewed.  At the time of her clinic visit, patient doing well overall from a cardiovascular perspective.  She denied any episodes of chest pain, shortness breath, PND, orthopnea, palpitations, significant peripheral edema, vertiginous symptoms, or presyncope/syncope.  Patient with a past medical history significant for cardiovascular  diagnoses.  Patient suffered an anterolateral wall STEMI on 08/14/2013 while living in [place].  She underwent diagnostic left heart catheterization that revealed 100% occlusion of the LAD.  Subsequently, thrombectomy and PCI was performed placing a 3.5 x 23 mm Xience Alpine DES x1 resulting in 0% residual stenosis and TIMI-3 flow.  Following her MI, patient has undergone serial monitoring of her cardiac function via echocardiogram.  Last TTE was performed on 11/26/2020 revealing a low normal left ventricular systolic function with an EF of 50-55%.  Diastolic Doppler parameters consistent with pseudonormalization (G2DD).  There was no evidence of significant valvular regurgitation or stenosis.  Blood pressure well controlled at 118/60 mmHg on currently prescribed CCB (amlodipine), beta-blocker (carvedilol), and ARB (losartan) therapies.  Patient is on rosuvastatin + ezetimibe for her HLD.  T2DM fairly well controlled on currently prescribed regimen; last Hgb A1c was 7.3% when checked on 12/08/2020. Of note, A1c has been checked multiple times since patient was last seen by cardiology. There has been further improvement to 6.6% as of the last check on 10/01/2021. Patient has an OSAH diagnosis and is reported to be compliant with prescribed nocturnal PAP therapy. Functional capacity, as defined by DASI, is documented as being >/= 4 METS.  No changes were made to her medication regimen.  Patient follow-up with outpatient cardiology in 12 months or sooner if needed.  [name] is scheduled for an elective LEFT TOTAL KNEE ARTHROPLASTY on 12/10/2021 with Dr. Thornton Park, MD. Given patient's past medical history significant for cardiovascular diagnoses, presurgical cardiac clearance was sought by the PAT team.  Per cardiology, "her RCRI is a class III risk, 6.6% risk of major cardiac event.  She is able to complete greater than 4 METS of physical activity. Per cardiology, "based ACC/AHA guidelines, the  patient's past medical history, and the amount of  time since her last clinic visit, this patient would be at an overall ACCEPTABLE risk for the planned procedure without further cardiovascular testing or intervention at this time". In review of her medication reconciliation, it is noted the patient is on daily antiplatelet therapy.  She has been instructed on recommendations from her cardiologist for holding her daily low-dose ASA for 7 days prior to her procedure with plans to restart as soon as postoperative bleeding risk felt to be minimized by her primary attending surgeon.  The patient is aware that her last dose of ASA should be on 12/02/2021.  Patient denies previous perioperative complications with anesthesia in the past. In review of the available records, it is noted that patient underwent a neuraxial anesthetic course here at [facility] (ASA III) in 11/2020 without documented complications.      11/30/2021    9:37 AM 06/11/2021    8:41 AM 06/04/2021    9:58 AM  Vitals with BMI  Height '5\' 7"'$  '5\' 7"'$  '5\' 7"'$   Weight 218 lbs 222 lbs 227 lbs 5 oz  BMI 34.14 95.28 41.32  Systolic 440 102 725  Diastolic 69 62 69  Pulse 68 82 65    Providers/Specialists:   NOTE: Primary physician provider listed below. Patient may have been seen by APP or partner within same practice.   PROVIDER ROLE / SPECIALTY LAST Larey Seat, MD Orthopedics (Surgeon) 10/12/2021  Olin Hauser, DO Primary Care Provider 06/11/2021  Ida Rogue, MD Cardiology 05/26/2021   Allergies:  Patient has no known allergies.  Current Home Medications:   No current facility-administered medications for this encounter.    ACCU-CHEK AVIVA PLUS test strip   acetaminophen (TYLENOL) 650 MG CR tablet   amLODipine (NORVASC) 2.5 MG tablet   aspirin EC 81 MG tablet   carvedilol (COREG) 3.125 MG tablet   ezetimibe (ZETIA) 10 MG tablet   fexofenadine (ALLEGRA) 180 MG tablet    FLUoxetine (PROZAC) 10 MG capsule   fluticasone (FLONASE) 50 MCG/ACT nasal spray   levothyroxine (SYNTHROID) 125 MCG tablet   losartan (COZAAR) 100 MG tablet   metFORMIN (GLUCOPHAGE-XR) 500 MG 24 hr tablet   nitroGLYCERIN (NITROSTAT) 0.4 MG SL tablet   OZEMPIC, 1 MG/DOSE, 4 MG/3ML SOPN   rosuvastatin (CRESTOR) 20 MG tablet   History:   Past Medical History:  Diagnosis Date   Coronary artery disease    a. 07/2013 [place]/PCI ([place] LM nl, LAD 100p (thrombectomy & 3.5x23 Alpine DES), LCX nl, RCA nl, EF 45%.   Depression    Diastolic dysfunction    a.) TTE 11/26/2020 - LVEF 50-55%; G2DD.   Endometrial cancer ([place])    Gravida 3 para 3    Hyperlipidemia    Hypertension    Hypertensive heart disease    Hypothyroid    Ischemic cardiomyopathy    a.) 07/2013 EF 45% @ time of STEMI; b.) 11/2013 Echo: EF 55-60%, mildly dil LA, nl RV fxn. c.) TTE 07/29/2015 - LVEF 55-60%, mild LA and RV dilation. d.) TTE 11/26/2020 - LVEF 50-55%; G2DD.   Menopause    Obstructive sleep apnea on CPAP    Osteoarthritis    PONV (postoperative nausea and vomiting)    ST elevation myocardial infarction (STEMI) of anterolateral wall ([place]) 08/14/2013   a.) LVEF reduced at 45%; Tx'd with trombectomy and PCI to 100% LAD; 3.5 x 23 mm Xience Alpine DES placed.   T2DM (type 2 diabetes mellitus) ([place])    Thyroid cancer (  [place])    a.) s/p total thyroidectomy. b.) on daily levothyroxine.   Past Surgical History:  Procedure Laterality Date   CATARACT EXTRACTION W/PHACO Left 08/30/2016   Procedure: CATARACT EXTRACTION PHACO AND INTRAOCULAR LENS PLACEMENT ([place]) Left daibetic;  Surgeon: Leandrew Koyanagi, MD;  Location: [facility];  Service: Ophthalmology;  Laterality: Left;  Diabetic - insulin and oral meds sleep apnea   CATARACT EXTRACTION W/PHACO Right 10/07/2016   Procedure: CATARACT EXTRACTION PHACO AND INTRAOCULAR LENS PLACEMENT (I[facility]);  Surgeon: Leandrew Koyanagi, MD;  Location: [facility]S;  Service:  Ophthalmology;  Laterality: Right;  Lot# [id number] H [place]: 00:43.3 AP%: 12.2 CDE: 5.28   CORONARY ANGIOPLASTY WITH STENT PLACEMENT Left 08/14/2013   Procedure: CORONARY ANGIOPLASTY WITH STENT PLACEMENT (3.5 x 23 mm Xience Alpine DES to LAD); Location: [facility], [place]; Surgeon: Algernon Huxley, MD   GANGLION CYST EXCISION Right [place]   total   TOTAL KNEE ARTHROPLASTY Right 12/18/2020   Procedure: RIGHT TOTAL KNEE ARTHROPLASTY;  Surgeon: Thornton Park, MD;  Location: [facility] ORS;  Service: Orthopedics;  Laterality: Right;   Family History  Problem Relation Age of Onset   Heart attack Father    Heart failure Father    Hypertension Father    Hyperlipidemia Father    Heart disease Father 26       CABG    Pulmonary fibrosis Son    Breast cancer Neg Hx    Social History   Tobacco Use   Smoking status: Never   Smokeless tobacco: Never  Vaping Use   Vaping Use: Never used  Substance Use Topics   Alcohol use: No   Drug use: No    Pertinent Clinical Results:  LABS: Labs reviewed: Acceptable for surgery.  Component Date Value Ref Range Status   MRSA, PCR 11/30/2021 NEGATIVE  NEGATIVE Final   Staphylococcus aureus 11/30/2021 POSITIVE (A)  NEGATIVE Final   Comment: (NOTE) The Xpert SA Assay (FDA approved for NASAL specimens in patients 74 years of age and older), is one component of a comprehensive surveillance program. It is not intended to diagnose infection nor to guide or monitor treatment. Performed at [facility], [address] [place], [place] [address]   WBC 11/30/2021 5.9  4.0 - 10.5 K/uL Final   RBC 11/30/2021 3.90  3.87 - 5.11 MIL/uL Final   Hemoglobin 11/30/2021 11.7 (L)  12.0 - 15.0 g/dL Final   HCT 11/30/2021 37.6  36.0 - 46.0 % Final   MCV 11/30/2021 96.4  80.0 - 100.0 fL Final   MCH 11/30/2021 30.0  26.0 - 34.0 pg Final   MCHC 11/30/2021 31.1  30.0 - 36.0 g/dL Final   RDW 11/30/2021  11.8  11.5 - 15.5 % Final   Platelets 11/30/2021 199  150 - 400 K/uL Final   nRBC 11/30/2021 0.0  0.0 - 0.2 % Final   Neutrophils Relative % 11/30/2021 57  % Final   Neutro Abs 11/30/2021 3.3  1.7 - 7.7 K/uL Final   Lymphocytes Relative 11/30/2021 28  % Final   Lymphs Abs 11/30/2021 1.7  0.7 - 4.0 K/uL Final   Monocytes Relative 11/30/2021 8  % Final   Monocytes Absolute 11/30/2021 0.5  0.1 - 1.0 K/uL Final   Eosinophils Relative 11/30/2021 6  % Final   Eosinophils Absolute 11/30/2021 0.4  0.0 - 0.5 K/uL Final   Basophils Relative 11/30/2021 1  % Final   Basophils Absolute 11/30/2021 0.0  0.0 - 0.1 K/uL Final   Immature Granulocytes 11/30/2021 0  % Final   Abs Immature Granulocytes 11/30/2021 0.01  0.00 - 0.07 K/uL Final   Performed at [facility], [place], [place] [address]   Sodium 11/30/2021 137  135 - 145 mmol/L Final   Potassium 11/30/2021 4.1  3.5 - 5.1 mmol/L Final   Chloride 11/30/2021 105  98 - 111 mmol/L Final   CO2 11/30/2021 27  22 - 32 mmol/L Final   Glucose, Bld 11/30/2021 122 (H)  70 - 99 mg/dL Final   Glucose reference range applies only to samples taken after fasting for at least 8 hours.   BUN 11/30/2021 18  8 - 23 mg/dL Final   Creatinine, Ser 11/30/2021 0.91  0.44 - 1.00 mg/dL Final   Calcium 11/30/2021 9.0  8.9 - 10.3 mg/dL Final   GFR, Estimated 11/30/2021 >60  >60 mL/min Final   Comment: (NOTE) Calculated using the CKD-EPI Creatinine Equation (2021)    Anion gap 11/30/2021 5  5 - 15 Final   Performed at [facility], [address] [place], [place] [address]   Prothrombin Time 11/30/2021 14.0  11.4 - 15.2 seconds Final   INR 11/30/2021 1.1  0.8 - 1.2 Final   Comment: (NOTE) INR goal varies based on device and disease states. Performed at [facility], [address] [place], [place] [address]    ABO/RH(D) 11/30/2021 O POS   Final   Antibody Screen 11/30/2021 NEG   Final   Sample Expiration 11/30/2021  12/14/2021, 2359   Final   Extend sample reason 11/30/2021    Final                   Value:NO TRANSFUSIONS OR PREGNANCY IN THE PAST 3 MONTHS Performed at [facility], [address] [place], [place] [address]    Color, Urine 11/30/2021 YELLOW (A)  YELLOW Final   APPearance 11/30/2021 CLOUDY (A)  CLEAR Final   Specific Gravity, Urine 11/30/2021 1.016  1.005 - 1.030 Final   pH 11/30/2021 5.0  5.0 - 8.0 Final   Glucose, UA 11/30/2021 NEGATIVE  NEGATIVE mg/dL Final   Hgb urine dipstick 11/30/2021 NEGATIVE  NEGATIVE Final   Bilirubin Urine 11/30/2021 NEGATIVE  NEGATIVE Final   Ketones, ur 11/30/2021 NEGATIVE  NEGATIVE mg/dL Final   Protein, ur 11/30/2021 NEGATIVE  NEGATIVE mg/dL Final   Nitrite 11/30/2021 NEGATIVE  NEGATIVE Final   Leukocytes,Ua 11/30/2021 TRACE (A)  NEGATIVE Final   RBC / HPF 11/30/2021 0-5  0 - 5 RBC/hpf Final   WBC, UA 11/30/2021 0-5  0 - 5 WBC/hpf Final   Bacteria, UA 11/30/2021 RARE (A)  NONE SEEN Final   Squamous Epithelial / LPF 11/30/2021 NONE SEEN  0 - 5 Final   Mucus 11/30/2021 PRESENT   Final   Performed at [facility], [address] [place], [place] [address]   Lab Results  Component Value Date   HGBA1C 6.6 (H) 10/01/2021    ECG: Date: 11/30/2021 Time ECG obtained: 0944 AM Rate: 61 bpm Rhythm: Normal sinus rhythm Axis (leads I and aVF): Normal Intervals: PR 180 ms. QRS 88 ms. QTc 412 ms. ST segment and T wave changes: No evidence of acute ST segment elevation or depression. TWI in aVL.  Evidence of an age undetermined anterior infarct Comparison: Similar to previous tracing obtained on 05/26/2021   IMAGING / PROCEDURES: TRANSTHORACIC ECHOCARDIOGRAM performed on 11/26/2020 Left ventricular ejection fraction, by estimation,  is 50 to 55%. The left ventricle has low normal function. The left ventricle has no regional  wall motion abnormalities. Left ventricular diastolic parameters are consistent with Grade II diastolic  dysfunction (pseudonormalization).  Right ventricular systolic function is normal. The right ventricular size is normal.  The mitral valve is normal in structure. No evidence of mitral valve regurgitation.  The aortic valve is grossly normal. Aortic valve regurgitation is not visualized.   Impression and Plan:  [name] has been referred for pre-anesthesia review and clearance prior to her undergoing the planned anesthetic and procedural courses. Available labs, pertinent testing, and imaging results were personally reviewed by me. This patient has been appropriately cleared by cardiology with an overall ACCEPTABLE risk of significant perioperative cardiovascular complications.   Based on clinical review performed today (12/04/21), barring any significant acute changes in the patient's overall condition, it is anticipated that she will be able to proceed with the planned surgical intervention. Any acute changes in clinical condition may necessitate her procedure being postponed and/or cancelled. Patient will meet with anesthesia team (MD and/or CRNA) on the day of her procedure for preoperative evaluation/assessment. Questions regarding anesthetic course will be fielded at that time.   Pre-surgical instructions were reviewed with the patient during her PAT appointment and questions were fielded by PAT clinical staff. Patient was advised that if any questions or concerns arise prior to her procedure then she should return a call to PAT and/or her surgeon's office to discuss.  Honor Loh, MSN, APRN, FNP-C, CEN [facility]  Peri-operative Services Nurse Practitioner Phone: [phone] Fax: [phone] 12/04/21 2:58 PM  NOTE: This note has been prepared using Dragon dictation software. Despite my best ability to proofread, there is always the potential that unintentional transcriptional errors may still occur from this process.

## 2021-12-04 ENCOUNTER — Ambulatory Visit: Payer: Medicare Other | Attending: Cardiovascular Disease | Admitting: General Practice

## 2021-12-04 ENCOUNTER — Telehealth: Payer: Medicare Other

## 2021-12-04 DIAGNOSIS — M1712 Unilateral primary osteoarthritis, left knee: Secondary | ICD-10-CM | POA: Diagnosis not present

## 2021-12-04 DIAGNOSIS — Z01810 Encounter for preprocedural cardiovascular examination: Secondary | ICD-10-CM | POA: Diagnosis not present

## 2021-12-04 DIAGNOSIS — R262 Difficulty in walking, not elsewhere classified: Secondary | ICD-10-CM | POA: Diagnosis not present

## 2021-12-04 DIAGNOSIS — M25662 Stiffness of left knee, not elsewhere classified: Secondary | ICD-10-CM | POA: Diagnosis not present

## 2021-12-04 NOTE — Progress Notes (Signed)
Virtual Visit via Telephone Note   Because of [name]'s co-morbid illnesses, she is at least at moderate risk for complications without adequate follow up.  This format is felt to be most appropriate for this patient at this time.  The patient did not have access to video technology/had technical difficulties with video requiring transitioning to audio format only (telephone).  All issues noted in this document were discussed and addressed.  No physical exam could be performed with this format.  Please refer to the patient's chart for her consent to telehealth for [facility].  Evaluation Performed:  Preoperative cardiovascular risk assessment _____________   Date:  12/04/2021   Patient ID:  [name], DOB 12-12-1945, MRN [id number] Patient Location:  Home Provider location:   Office  Primary Care Provider:  Olin Hauser, DO Primary Cardiologist:  Ida Rogue, MD  Chief Complaint / Patient Profile   76 y.o. y/o female with a h/o coronary artery disease, ischemic cardiomyopathy, type 2 diabetes, CKD, hyperlipidemia, essential hypertension who is pending left total knee arthroplasty and presents today for telephonic preoperative cardiovascular risk assessment.  Past Medical History    Past Medical History:  Diagnosis Date   Coronary artery disease    a. 07[place] ([place] LM nl, LAD 100p (thrombectomy & 3.5x23 Alpine DES), LCX nl, RCA nl, EF 45%.   Depression    Diastolic dysfunction    a.) TTE 11/26/2020 - LVEF 50-55%; G2DD.   Endometrial cancer ([place])    Gravida 3 para 3    Hyperlipidemia    Hypertension    Hypertensive heart disease    Hypothyroid    Ischemic cardiomyopathy    a.) 07/2013 EF 45% @ time of STEMI; b.) 11/2013 Echo: EF 55-60%, mildly dil LA, nl RV fxn. c.) TTE 07/29/2015 - LVEF 55-60%, mild LA and RV dilation. d.) TTE 11/26/2020 - LVEF 50-55%; G2DD.   Menopause    Obstructive sleep apnea on CPAP    Osteoarthritis     PONV (postoperative nausea and vomiting)    ST elevation myocardial infarction (STEMI) of anterolateral wall ([place]) 08/14/2013   a.) LVEF reduced at 45%; Tx'd with trombectomy and PCI to 100% LAD; 3.5 x 23 mm Xience Alpine DES placed.   T2DM (type 2 diabetes mellitus) ([place])    Thyroid cancer ([place])    a.) s/p total thyroidectomy. b.) on daily levothyroxine.   Past Surgical History:  Procedure Laterality Date   CATARACT EXTRACTION W/PHACO Left 08/30/2016   Procedure: CATARACT EXTRACTION PHACO AND INTRAOCULAR LENS PLACEMENT ([place]) Left daibetic;  Surgeon: Leandrew Koyanagi, MD;  Location: [facility];  Service: Ophthalmology;  Laterality: Left;  Diabetic - insulin and oral meds sleep apnea   CATARACT EXTRACTION W/PHACO Right 10/07/2016   Procedure: CATARACT EXTRACTION PHACO AND INTRAOCULAR LENS PLACEMENT ([facility]);  Surgeon: Leandrew Koyanagi, MD;  Location: [facility];  Service: Ophthalmology;  Laterality: Right;  Lot# [id number] H [place]: 00:43.3 AP%: 12.2 CDE: 5.28   CORONARY ANGIOPLASTY WITH STENT PLACEMENT Left 08/14/2013   Procedure: CORONARY ANGIOPLASTY WITH STENT PLACEMENT (3.5 x 23 mm Xience Alpine DES to LAD); Location: [facility], [place]; Surgeon: Algernon Huxley, MD   GANGLION CYST EXCISION Right [place]   total   TOTAL KNEE ARTHROPLASTY Right 12/18/2020   Procedure: RIGHT TOTAL KNEE ARTHROPLASTY;  Surgeon: Thornton Park, MD;  Location: [facility] OR[facility];  Service: Orthopedics;  Laterality: Right;    Allergies  No  Known Allergies  History of Present Illness    [name] is a 76 y.o. female who presents via audio/video conferencing for a telehealth visit today.  Pt was last seen in cardiology clinic on 05/26/2021 by Dr. Rockey Situ.  At that time [name] was doing well .  The patient is now pending procedure as outlined above. Since her last visit, she remained stable from a cardiac standpoint   Today she  denies chest pain, shortness of breath, lower extremity edema, fatigue, palpitations, melena, hematuria, hemoptysis, diaphoresis, weakness, presyncope, syncope, orthopnea, and PND.   Home Medications    Prior to Admission medications   Medication Sig Start Date End Date Taking? Authorizing Provider  ACCU-CHEK AVIVA PLUS test strip U UTD BID 11/10/16   Karamalegos, Devonne Doughty, DO  acetaminophen (TYLENOL) 650 MG CR tablet Take 1,950 mg by mouth daily.    [provider]  amLODipine (NORVASC) 2.5 MG tablet TAKE 1 TABLET BY MOUTH DAILY 06/16/21   Minna Merritts, MD  aspirin EC 81 MG tablet Take 81 mg by mouth daily. Swallow whole.    [provider]  carvedilol (COREG) 3.125 MG tablet TAKE 1 TABLET BY MOUTH TWICE  DAILY Patient taking differently: daily. 3.125 07/28/21   Minna Merritts, MD  ezetimibe (ZETIA) 10 MG tablet TAKE 1 TABLET BY MOUTH DAILY 07/02/21   Minna Merritts, MD  fexofenadine (ALLEGRA) 180 MG tablet Take 180 mg by mouth daily as needed for allergies or rhinitis. Patient not taking: Reported on 11/30/2021    [provider]  FLUoxetine (PROZAC) 10 MG capsule TAKE 1 CAPSULE BY MOUTH  DAILY 06/25/21   Parks Ranger, Devonne Doughty, DO  fluticasone (FLONASE) 50 MCG/ACT nasal spray Place 2 sprays into both nostrils daily. Patient taking differently: Place 2-3 sprays into both nostrils at bedtime as needed. 02/18/21   Brunetta Jeans, PA-C  levothyroxine (SYNTHROID) 125 MCG tablet TAKE 1 TABLET BY MOUTH  DAILY BEFORE BREAKFAST 09/29/21   Karamalegos, Devonne Doughty, DO  losartan (COZAAR) 100 MG tablet TAKE 1 TABLET BY MOUTH DAILY 08/17/21   Minna Merritts, MD  metFORMIN (GLUCOPHAGE-XR) 500 MG 24 hr tablet TAKE 3 TABLETS BY MOUTH  DAILY WITH BREAKFAST 07/28/21   Karamalegos, Devonne Doughty, DO  nitroGLYCERIN (NITROSTAT) 0.4 MG SL tablet Place 1 tablet (0.4 mg total) under the tongue every 5 (five) minutes as needed for chest pain. 01/29/20   Gollan, Kathlene November, MD   OZEMPIC, 1 MG/DOSE, 4 MG/3ML SOPN INJECT SUBCUTANEOUSLY 1 MG EVERY WEEK Patient taking differently: Inject 1 mg into the skin once a week. sunday 11/23/21   Olin Hauser, DO  rosuvastatin (CRESTOR) 20 MG tablet TAKE 1 TABLET BY MOUTH DAILY 07/28/21   Minna Merritts, MD    Physical Exam    Vital Signs:  [name] does not have vital signs available for review today.  Given telephonic nature of communication, physical exam is limited. AAOx3. NAD. Normal affect.  Speech and respirations are unlabored.  Accessory Clinical Findings    None  Assessment & Plan    1.  Preoperative Cardiovascular Risk Assessment: Left total knee arthroplasty; Dr. Thornton Park   Primary Cardiologist: Ida Rogue, MD  Chart reviewed as part of pre-operative protocol coverage. Given past medical history and time since last visit, based on ACC/AHA guidelines, [name] would be at acceptable risk for the planned procedure without further cardiovascular testing.    The patient was advised that if she  develops new symptoms prior to surgery to contact our office to arrange for a follow-up visit, and she verbalized understanding.  Ideally aspirin should be continued without interruption, however if the bleeding risk is too great, aspirin may be held for 7 days prior to surgery. Please resume aspirin post operatively when it is felt to be safe from a bleeding standpoint.    Her RCRI is a class III risk, 6.6% risk of major cardiac event.  She is able to complete greater than 4 METS of physical activity.   A copy of this note will be routed to requesting surgeon.  Time:   Today, I have spent 8 minutes with the patient with telehealth technology discussing medical history, symptoms, and management plan.  Prior to her phone evaluation I spent greater than 10 minutes reviewing her past medical history and cardiac medications.   Deberah Pelton, NP  12/04/2021, 8:22 AM

## 2021-12-05 ENCOUNTER — Other Ambulatory Visit: Payer: Self-pay | Admitting: Family Medicine

## 2021-12-05 DIAGNOSIS — F3340 Major depressive disorder, recurrent, in remission, unspecified: Secondary | ICD-10-CM

## 2021-12-07 DIAGNOSIS — R262 Difficulty in walking, not elsewhere classified: Secondary | ICD-10-CM | POA: Diagnosis not present

## 2021-12-07 DIAGNOSIS — M1712 Unilateral primary osteoarthritis, left knee: Secondary | ICD-10-CM | POA: Diagnosis not present

## 2021-12-07 DIAGNOSIS — M25662 Stiffness of left knee, not elsewhere classified: Secondary | ICD-10-CM | POA: Diagnosis not present

## 2021-12-07 NOTE — Telephone Encounter (Signed)
Requested medications are due for refill today.  yes  Requested medications are on the active medications list.  yes  Last refill. 06/25/2021 #90 1 rf  Future visit scheduled.   no  Notes to clinic.  Pt requesting a 1 year supply.     Requested Prescriptions  Pending Prescriptions Disp Refills   FLUoxetine (PROZAC) 10 MG capsule [Pharmacy Med Name: FLUoxetine HCl 10 MG Oral Capsule] 90 capsule 3    Sig: TAKE 1 CAPSULE BY MOUTH DAILY     Psychiatry:  Antidepressants - SSRI Passed - 12/05/2021 11:16 PM      Passed - Completed PHQ-2 or PHQ-9 in the last 360 days      Passed - Valid encounter within last 6 months    Recent Outpatient Visits           5 months ago Controlled type 2 diabetes mellitus with diabetic nephropathy, without long-term current use of insulin ([place])   [place], DO   1 year ago Controlled type 2 diabetes mellitus with diabetic nephropathy, without long-term current use of insulin ([place])   [place], DO   1 year ago Annual physical exam   [facility] Olin Hauser, DO   2 years ago Controlled type 2 diabetes mellitus with diabetic nephropathy, without long-term current use of insulin ([place])   [place], Devonne Doughty, DO   2 years ago Annual physical exam   [place], Devonne Doughty, DO

## 2021-12-10 ENCOUNTER — Ambulatory Visit: Payer: Medicare Other

## 2021-12-10 ENCOUNTER — Ambulatory Visit: Payer: Medicare Other | Admitting: Urgent Care

## 2021-12-10 ENCOUNTER — Other Ambulatory Visit: Payer: Self-pay

## 2021-12-10 ENCOUNTER — Observation Stay
Admission: RE | Admit: 2021-12-10 | Discharge: 2021-12-11 | Disposition: A | Discharge status: Home / Self Care | Payer: Medicare Other | Source: Ambulatory Visit | Attending: Orthopedic Surgery | Admitting: Orthopedic Surgery

## 2021-12-10 ENCOUNTER — Encounter
Admission: RE | Disposition: A | Discharge status: Home / Self Care | Payer: Self-pay | Source: Ambulatory Visit | Attending: Orthopedic Surgery

## 2021-12-10 ENCOUNTER — Encounter: Payer: Self-pay | Admitting: Orthopedic Surgery

## 2021-12-10 DIAGNOSIS — Z96652 Presence of left artificial knee joint: Secondary | ICD-10-CM

## 2021-12-10 DIAGNOSIS — I2510 Atherosclerotic heart disease of native coronary artery without angina pectoris: Secondary | ICD-10-CM | POA: Insufficient documentation

## 2021-12-10 DIAGNOSIS — Z7984 Long term (current) use of oral hypoglycemic drugs: Secondary | ICD-10-CM | POA: Diagnosis not present

## 2021-12-10 DIAGNOSIS — Z23 Encounter for immunization: Secondary | ICD-10-CM | POA: Insufficient documentation

## 2021-12-10 DIAGNOSIS — Z96651 Presence of right artificial knee joint: Secondary | ICD-10-CM | POA: Diagnosis not present

## 2021-12-10 DIAGNOSIS — M1712 Unilateral primary osteoarthritis, left knee: Secondary | ICD-10-CM | POA: Diagnosis not present

## 2021-12-10 DIAGNOSIS — E119 Type 2 diabetes mellitus without complications: Secondary | ICD-10-CM | POA: Insufficient documentation

## 2021-12-10 DIAGNOSIS — Z8542 Personal history of malignant neoplasm of other parts of uterus: Secondary | ICD-10-CM | POA: Diagnosis not present

## 2021-12-10 DIAGNOSIS — Z85850 Personal history of malignant neoplasm of thyroid: Secondary | ICD-10-CM | POA: Diagnosis not present

## 2021-12-10 DIAGNOSIS — Z955 Presence of coronary angioplasty implant and graft: Secondary | ICD-10-CM | POA: Diagnosis not present

## 2021-12-10 DIAGNOSIS — Z471 Aftercare following joint replacement surgery: Secondary | ICD-10-CM | POA: Diagnosis not present

## 2021-12-10 DIAGNOSIS — E039 Hypothyroidism, unspecified: Secondary | ICD-10-CM | POA: Diagnosis not present

## 2021-12-10 DIAGNOSIS — Z7982 Long term (current) use of aspirin: Secondary | ICD-10-CM | POA: Insufficient documentation

## 2021-12-10 DIAGNOSIS — I119 Hypertensive heart disease without heart failure: Secondary | ICD-10-CM | POA: Insufficient documentation

## 2021-12-10 DIAGNOSIS — Z79899 Other long term (current) drug therapy: Secondary | ICD-10-CM | POA: Diagnosis not present

## 2021-12-10 DIAGNOSIS — Z01812 Encounter for preprocedural laboratory examination: Secondary | ICD-10-CM

## 2021-12-10 HISTORY — PX: TOTAL KNEE ARTHROPLASTY: SHX125

## 2021-12-10 LAB — GLUCOSE, CAPILLARY
Glucose-Capillary: 136 mg/dL — ABNORMAL HIGH (ref 70–99)
Glucose-Capillary: 201 mg/dL — ABNORMAL HIGH (ref 70–99)

## 2021-12-10 SURGERY — ARTHROPLASTY, KNEE, TOTAL
Anesthesia: Spinal | Site: Knee | Laterality: Left

## 2021-12-10 MED ORDER — CEFAZOLIN SODIUM-DEXTROSE 2-4 GM/100ML-% IV SOLN
2.0000 g | INTRAVENOUS | Status: AC
  Administered 2021-12-10: 2 g via INTRAVENOUS

## 2021-12-10 MED ORDER — NITROGLYCERIN 0.4 MG SL SUBL: 0.4000 mg | SUBLINGUAL_TABLET | SUBLINGUAL | Status: DC | PRN

## 2021-12-10 MED ORDER — CEFAZOLIN SODIUM-DEXTROSE 2-4 GM/100ML-% IV SOLN
INTRAVENOUS | Status: AC
  Administered 2021-12-10: 2 g via INTRAVENOUS
  Filled 2021-12-10: qty 100

## 2021-12-10 MED ORDER — CEFAZOLIN SODIUM-DEXTROSE 2-4 GM/100ML-% IV SOLN: 2.0000 g | Freq: Four times a day (QID) | INTRAVENOUS | Status: AC

## 2021-12-10 MED ORDER — METHOCARBAMOL 1000 MG/10ML IJ SOLN: 500.0000 mg | Freq: Four times a day (QID) | INTRAVENOUS | Status: DC | PRN

## 2021-12-10 MED ORDER — BUPIVACAINE LIPOSOME 1.3 % IJ SUSP
INTRAMUSCULAR | Status: AC
  Filled 2021-12-10: qty 20

## 2021-12-10 MED ORDER — BUPIVACAINE HCL (PF) 0.5 % IJ SOLN
INTRAMUSCULAR | Status: DC | PRN
  Administered 2021-12-10: 3 mL via INTRATHECAL

## 2021-12-10 MED ORDER — TRAMADOL HCL 50 MG PO TABS
ORAL_TABLET | ORAL | Status: AC
  Administered 2021-12-11: 50 mg via ORAL
  Filled 2021-12-10: qty 1

## 2021-12-10 MED ORDER — ACETAMINOPHEN 500 MG PO TABS
1000.0000 mg | ORAL_TABLET | Freq: Four times a day (QID) | ORAL | Status: AC
  Administered 2021-12-10 (×2): 1000 mg via ORAL

## 2021-12-10 MED ORDER — HYDROMORPHONE HCL 1 MG/ML IJ SOLN: 0.5000 mg | INTRAMUSCULAR | Status: DC | PRN

## 2021-12-10 MED ORDER — PHENYLEPHRINE 80 MCG/ML (10ML) SYRINGE FOR IV PUSH (FOR BLOOD PRESSURE SUPPORT)
PREFILLED_SYRINGE | INTRAVENOUS | Status: AC
  Filled 2021-12-10: qty 10

## 2021-12-10 MED ORDER — FENTANYL CITRATE (PF) 100 MCG/2ML IJ SOLN: 25.0000 ug | INTRAMUSCULAR | Status: DC | PRN

## 2021-12-10 MED ORDER — FLUOXETINE HCL 10 MG PO CAPS
10.0000 mg | ORAL_CAPSULE | Freq: Every day | ORAL | Status: DC
  Administered 2021-12-11: 10 mg via ORAL
  Filled 2021-12-10: qty 1

## 2021-12-10 MED ORDER — PROPOFOL 1000 MG/100ML IV EMUL
INTRAVENOUS | Status: AC
  Filled 2021-12-10: qty 100

## 2021-12-10 MED ORDER — TRAMADOL HCL 50 MG PO TABS
50.0000 mg | ORAL_TABLET | Freq: Four times a day (QID) | ORAL | Status: DC
  Administered 2021-12-10: 50 mg via ORAL

## 2021-12-10 MED ORDER — ASPIRIN 81 MG PO CHEW: 81.0000 mg | CHEWABLE_TABLET | Freq: Every day | ORAL | Status: DC

## 2021-12-10 MED ORDER — PROPOFOL 10 MG/ML IV BOLUS
INTRAVENOUS | Status: DC | PRN
  Administered 2021-12-10: 70 ug/kg/min via INTRAVENOUS
  Administered 2021-12-10: 50 mg via INTRAVENOUS

## 2021-12-10 MED ORDER — CARVEDILOL 3.125 MG PO TABS
3.1250 mg | ORAL_TABLET | Freq: Every day | ORAL | Status: DC
  Administered 2021-12-11: 3.125 mg via ORAL
  Filled 2021-12-10: qty 1

## 2021-12-10 MED ORDER — ACETAMINOPHEN 500 MG PO TABS
ORAL_TABLET | ORAL | Status: AC
  Filled 2021-12-10: qty 2

## 2021-12-10 MED ORDER — DEXAMETHASONE SODIUM PHOSPHATE 10 MG/ML IJ SOLN
INTRAMUSCULAR | Status: AC
  Filled 2021-12-10: qty 1

## 2021-12-10 MED ORDER — CARVEDILOL 3.125 MG PO TABS
3.1250 mg | ORAL_TABLET | Freq: Two times a day (BID) | ORAL | Status: DC
  Filled 2021-12-10: qty 1

## 2021-12-10 MED ORDER — TRANEXAMIC ACID-NACL 1000-0.7 MG/100ML-% IV SOLN: 1000.0000 mg | Freq: Once | INTRAVENOUS | Status: AC

## 2021-12-10 MED ORDER — EZETIMIBE 10 MG PO TABS
10.0000 mg | ORAL_TABLET | Freq: Every day | ORAL | Status: DC
  Administered 2021-12-11: 10 mg via ORAL
  Filled 2021-12-10: qty 1

## 2021-12-10 MED ORDER — KETOROLAC TROMETHAMINE 30 MG/ML IJ SOLN
INTRAMUSCULAR | Status: AC
  Filled 2021-12-10: qty 1

## 2021-12-10 MED ORDER — CHLORHEXIDINE GLUCONATE CLOTH 2 % EX PADS
6.0000 | MEDICATED_PAD | Freq: Once | CUTANEOUS | Status: AC
  Administered 2021-12-10: 2 via TOPICAL

## 2021-12-10 MED ORDER — FAMOTIDINE 20 MG PO TABS
ORAL_TABLET | ORAL | Status: AC
  Administered 2021-12-10: 20 mg via ORAL
  Filled 2021-12-10: qty 1

## 2021-12-10 MED ORDER — OXYCODONE HCL 5 MG/5ML PO SOLN: 5.0000 mg | Freq: Once | ORAL | Status: DC | PRN

## 2021-12-10 MED ORDER — ACETAMINOPHEN 500 MG PO TABS: 1000.0000 mg | ORAL_TABLET | ORAL | Status: AC

## 2021-12-10 MED ORDER — INFLUENZA VAC A&B SA ADJ QUAD 0.5 ML IM PRSY
0.5000 mL | PREFILLED_SYRINGE | INTRAMUSCULAR | Status: AC
  Administered 2021-12-11: 0.5 mL via INTRAMUSCULAR
  Filled 2021-12-10 (×2): qty 0.5

## 2021-12-10 MED ORDER — OXYCODONE HCL 5 MG PO TABS: 10.0000 mg | ORAL_TABLET | ORAL | Status: DC | PRN

## 2021-12-10 MED ORDER — PHENOL 1.4 % MT LIQD: 1.0000 | OROMUCOSAL | Status: DC | PRN

## 2021-12-10 MED ORDER — OXYCODONE HCL 5 MG PO TABS: 5.0000 mg | ORAL_TABLET | ORAL | Status: DC | PRN

## 2021-12-10 MED ORDER — 0.9 % SODIUM CHLORIDE (POUR BTL) OPTIME
TOPICAL | Status: DC | PRN
  Administered 2021-12-10: 500 mL

## 2021-12-10 MED ORDER — CEFAZOLIN SODIUM-DEXTROSE 2-4 GM/100ML-% IV SOLN
INTRAVENOUS | Status: AC
  Filled 2021-12-10: qty 100

## 2021-12-10 MED ORDER — FENTANYL CITRATE (PF) 100 MCG/2ML IJ SOLN
INTRAMUSCULAR | Status: AC
  Administered 2021-12-10: 25 ug via INTRAVENOUS
  Filled 2021-12-10: qty 2

## 2021-12-10 MED ORDER — BUPIVACAINE HCL (PF) 0.5 % IJ SOLN
INTRAMUSCULAR | Status: AC
  Filled 2021-12-10: qty 10

## 2021-12-10 MED ORDER — TRANEXAMIC ACID-NACL 1000-0.7 MG/100ML-% IV SOLN
INTRAVENOUS | Status: DC | PRN
  Administered 2021-12-10: 1000 mg via INTRAVENOUS

## 2021-12-10 MED ORDER — ONDANSETRON HCL 4 MG/2ML IJ SOLN: 4.0000 mg | Freq: Four times a day (QID) | INTRAMUSCULAR | Status: DC | PRN

## 2021-12-10 MED ORDER — SODIUM CHLORIDE 0.9 % IR SOLN
Status: DC | PRN
  Administered 2021-12-10: 3000 mL

## 2021-12-10 MED ORDER — CHLORHEXIDINE GLUCONATE 0.12 % MT SOLN: 15.0000 mL | Freq: Once | OROMUCOSAL | Status: AC

## 2021-12-10 MED ORDER — ORAL CARE MOUTH RINSE: 15.0000 mL | Freq: Once | OROMUCOSAL | Status: AC

## 2021-12-10 MED ORDER — TRANEXAMIC ACID-NACL 1000-0.7 MG/100ML-% IV SOLN
INTRAVENOUS | Status: AC
  Administered 2021-12-10: 1000 mg via INTRAVENOUS
  Filled 2021-12-10: qty 100

## 2021-12-10 MED ORDER — MENTHOL 3 MG MT LOZG: 1.0000 | LOZENGE | OROMUCOSAL | Status: DC | PRN

## 2021-12-10 MED ORDER — DOCUSATE SODIUM 100 MG PO CAPS
ORAL_CAPSULE | ORAL | Status: AC
  Administered 2021-12-10: 100 mg via ORAL
  Filled 2021-12-10: qty 1

## 2021-12-10 MED ORDER — LIDOCAINE HCL (CARDIAC) PF 100 MG/5ML IV SOSY
PREFILLED_SYRINGE | INTRAVENOUS | Status: DC | PRN
  Administered 2021-12-10: 40 mg via INTRAVENOUS

## 2021-12-10 MED ORDER — ENOXAPARIN SODIUM 40 MG/0.4ML IJ SOSY: 40.0000 mg | PREFILLED_SYRINGE | INTRAMUSCULAR | Status: DC

## 2021-12-10 MED ORDER — LOSARTAN POTASSIUM 50 MG PO TABS
100.0000 mg | ORAL_TABLET | Freq: Every day | ORAL | Status: DC
  Administered 2021-12-10 – 2021-12-11 (×2): 100 mg via ORAL
  Filled 2021-12-10 (×2): qty 2

## 2021-12-10 MED ORDER — BISACODYL 5 MG PO TBEC: 10.0000 mg | DELAYED_RELEASE_TABLET | Freq: Every day | ORAL | Status: DC | PRN

## 2021-12-10 MED ORDER — CHLORHEXIDINE GLUCONATE CLOTH 2 % EX PADS
6.0000 | MEDICATED_PAD | Freq: Once | CUTANEOUS | Status: AC
  Administered 2021-12-10: 6 via TOPICAL

## 2021-12-10 MED ORDER — MIDAZOLAM HCL 2 MG/2ML IJ SOLN
INTRAMUSCULAR | Status: AC
  Filled 2021-12-10: qty 2

## 2021-12-10 MED ORDER — MORPHINE SULFATE 4 MG/ML IJ SOLN
INTRAMUSCULAR | Status: DC | PRN
  Administered 2021-12-10: 62 mL

## 2021-12-10 MED ORDER — SENNOSIDES-DOCUSATE SODIUM 8.6-50 MG PO TABS: 1.0000 | ORAL_TABLET | Freq: Every evening | ORAL | Status: DC | PRN

## 2021-12-10 MED ORDER — SODIUM CHLORIDE 0.9 % IV SOLN: INTRAVENOUS | Status: DC

## 2021-12-10 MED ORDER — PANTOPRAZOLE SODIUM 40 MG PO TBEC
DELAYED_RELEASE_TABLET | ORAL | Status: AC
  Filled 2021-12-10: qty 1

## 2021-12-10 MED ORDER — PHENYLEPHRINE HCL-NACL 20-0.9 MG/250ML-% IV SOLN
INTRAVENOUS | Status: AC
  Filled 2021-12-10: qty 250

## 2021-12-10 MED ORDER — LEVOTHYROXINE SODIUM 125 MCG PO TABS
125.0000 ug | ORAL_TABLET | Freq: Every day | ORAL | Status: DC
  Administered 2021-12-11: 125 ug via ORAL
  Filled 2021-12-10: qty 1

## 2021-12-10 MED ORDER — METHOCARBAMOL 500 MG PO TABS: 500.0000 mg | ORAL_TABLET | Freq: Four times a day (QID) | ORAL | Status: DC | PRN

## 2021-12-10 MED ORDER — SODIUM CHLORIDE (PF) 0.9 % IJ SOLN
INTRAMUSCULAR | Status: DC | PRN
  Administered 2021-12-10: 80 mL

## 2021-12-10 MED ORDER — ACETAMINOPHEN 325 MG PO TABS: 325.0000 mg | ORAL_TABLET | Freq: Four times a day (QID) | ORAL | Status: DC | PRN

## 2021-12-10 MED ORDER — OXYCODONE HCL 5 MG PO TABS: 5.0000 mg | ORAL_TABLET | Freq: Once | ORAL | Status: DC | PRN

## 2021-12-10 MED ORDER — ONDANSETRON HCL 4 MG/2ML IJ SOLN
INTRAMUSCULAR | Status: AC
  Filled 2021-12-10: qty 2

## 2021-12-10 MED ORDER — BUPIVACAINE-EPINEPHRINE (PF) 0.25% -1:200000 IJ SOLN
INTRAMUSCULAR | Status: AC
  Filled 2021-12-10: qty 60

## 2021-12-10 MED ORDER — GLYCOPYRROLATE 0.2 MG/ML IJ SOLN
INTRAMUSCULAR | Status: AC
  Filled 2021-12-10: qty 1

## 2021-12-10 MED ORDER — DIPHENHYDRAMINE HCL 12.5 MG/5ML PO ELIX
ORAL_SOLUTION | ORAL | Status: AC
  Filled 2021-12-10: qty 5

## 2021-12-10 MED ORDER — PHENYLEPHRINE HCL-NACL 20-0.9 MG/250ML-% IV SOLN
INTRAVENOUS | Status: DC | PRN
  Administered 2021-12-10: 80 ug/min via INTRAVENOUS

## 2021-12-10 MED ORDER — MIDAZOLAM HCL 5 MG/5ML IJ SOLN
INTRAMUSCULAR | Status: DC | PRN
  Administered 2021-12-10: 2 mg via INTRAVENOUS

## 2021-12-10 MED ORDER — ONDANSETRON HCL 4 MG/2ML IJ SOLN
INTRAMUSCULAR | Status: DC | PRN
  Administered 2021-12-10 (×2): 4 mg via INTRAVENOUS

## 2021-12-10 MED ORDER — MORPHINE SULFATE (PF) 4 MG/ML IV SOLN
INTRAVENOUS | Status: AC
  Filled 2021-12-10: qty 1

## 2021-12-10 MED ORDER — ACETAMINOPHEN 500 MG PO TABS
ORAL_TABLET | ORAL | Status: AC
  Administered 2021-12-10: 1000 mg via ORAL
  Filled 2021-12-10: qty 2

## 2021-12-10 MED ORDER — AMLODIPINE BESYLATE 5 MG PO TABS
2.5000 mg | ORAL_TABLET | Freq: Every day | ORAL | Status: DC
  Administered 2021-12-11: 2.5 mg via ORAL
  Filled 2021-12-10: qty 0.5

## 2021-12-10 MED ORDER — DOCUSATE SODIUM 100 MG PO CAPS: 100.0000 mg | ORAL_CAPSULE | Freq: Two times a day (BID) | ORAL | Status: DC

## 2021-12-10 MED ORDER — ONDANSETRON HCL 4 MG PO TABS: 4.0000 mg | ORAL_TABLET | Freq: Four times a day (QID) | ORAL | Status: DC | PRN

## 2021-12-10 MED ORDER — FAMOTIDINE 20 MG PO TABS: 20.0000 mg | ORAL_TABLET | Freq: Once | ORAL | Status: AC

## 2021-12-10 MED ORDER — ROSUVASTATIN CALCIUM 20 MG PO TABS
20.0000 mg | ORAL_TABLET | Freq: Every day | ORAL | Status: DC
  Administered 2021-12-11: 20 mg via ORAL
  Filled 2021-12-10: qty 1

## 2021-12-10 MED ORDER — FLUTICASONE PROPIONATE 50 MCG/ACT NA SUSP
2.0000 | Freq: Every day | NASAL | Status: DC
  Filled 2021-12-10: qty 16

## 2021-12-10 MED ORDER — ACETAMINOPHEN 500 MG PO TABS
ORAL_TABLET | ORAL | Status: AC
  Administered 2021-12-11: 1000 mg via ORAL
  Filled 2021-12-10: qty 2

## 2021-12-10 MED ORDER — METFORMIN HCL ER 500 MG PO TB24
500.0000 mg | ORAL_TABLET | Freq: Every day | ORAL | Status: DC
  Administered 2021-12-11: 500 mg via ORAL
  Filled 2021-12-10: qty 1

## 2021-12-10 MED ORDER — TRAMADOL HCL 50 MG PO TABS
ORAL_TABLET | ORAL | Status: AC
  Filled 2021-12-10: qty 1

## 2021-12-10 MED ORDER — FENTANYL CITRATE (PF) 100 MCG/2ML IJ SOLN
25.0000 ug | INTRAMUSCULAR | Status: DC | PRN
  Administered 2021-12-10 (×3): 25 ug via INTRAVENOUS

## 2021-12-10 MED ORDER — CHLORHEXIDINE GLUCONATE 0.12 % MT SOLN
OROMUCOSAL | Status: AC
  Administered 2021-12-10: 15 mL via OROMUCOSAL
  Filled 2021-12-10: qty 15

## 2021-12-10 MED ORDER — SODIUM CHLORIDE FLUSH 0.9 % IV SOLN
INTRAVENOUS | Status: AC
  Filled 2021-12-10: qty 40

## 2021-12-10 MED ORDER — DIPHENHYDRAMINE HCL 12.5 MG/5ML PO ELIX: 12.5000 mg | ORAL_SOLUTION | ORAL | Status: DC | PRN

## 2021-12-10 MED ORDER — PHENYLEPHRINE 80 MCG/ML (10ML) SYRINGE FOR IV PUSH (FOR BLOOD PRESSURE SUPPORT)
PREFILLED_SYRINGE | INTRAVENOUS | Status: DC | PRN
  Administered 2021-12-10 (×5): 80 ug via INTRAVENOUS

## 2021-12-10 MED ORDER — TRANEXAMIC ACID 1000 MG/10ML IV SOLN
INTRAVENOUS | Status: AC
  Filled 2021-12-10: qty 10

## 2021-12-10 MED ORDER — PANTOPRAZOLE SODIUM 40 MG PO TBEC
40.0000 mg | DELAYED_RELEASE_TABLET | Freq: Every day | ORAL | Status: DC
  Administered 2021-12-10: 40 mg via ORAL

## 2021-12-10 SURGICAL SUPPLY — 71 items
BLADE SAW 90X13X1.19 OSCILLAT (BLADE) ×1 IMPLANT
BLADE SAW 90X25X1.19 OSCILLAT (BLADE) ×1 IMPLANT
CEMENT HV SMART SET (Cement) ×2 IMPLANT
CEMENT TIBIA MBT (Knees) IMPLANT
CNTNR SPEC 2.5X3XGRAD LEK (MISCELLANEOUS) ×1
CONT SPEC 4OZ STER OR WHT (MISCELLANEOUS) ×1
CONTAINER SPEC 2.5X3XGRAD LEK (MISCELLANEOUS) ×1 IMPLANT
COOLER POLAR GLACIER W/PUMP (MISCELLANEOUS) ×1 IMPLANT
CUFF TOURN SGL QUICK 34 (TOURNIQUET CUFF)
CUFF TRNQT CYL 34X4.125X (TOURNIQUET CUFF) IMPLANT
DRAPE 3/4 80X56 (DRAPES) ×2 IMPLANT
DRAPE IMP U-DRAPE 54X76 (DRAPES) ×2 IMPLANT
DRAPE INCISE IOBAN 66X60 STRL (DRAPES) ×1 IMPLANT
DRAPE SURG 17X11 SM STRL (DRAPES) ×2 IMPLANT
DRSG AQUACEL AG ADV 3.5X10 (GAUZE/BANDAGES/DRESSINGS) ×1 IMPLANT
DRSG MEPILEX SACRM 8.7X9.8 (GAUZE/BANDAGES/DRESSINGS) ×1 IMPLANT
DURAPREP 26ML APPLICATOR (WOUND CARE) ×4 IMPLANT
ELECT REM PT RETURN 9FT ADLT (ELECTROSURGICAL) ×1
ELECTRODE REM PT RTRN 9FT ADLT (ELECTROSURGICAL) ×1 IMPLANT
FEMUR SIGMA PS KNEE SZ 4.0N L (Femur) IMPLANT
GAUZE SPONGE 4X4 12PLY STRL (GAUZE/BANDAGES/DRESSINGS) ×1 IMPLANT
GLOVE BIOGEL PI IND STRL 9 (GLOVE) ×2 IMPLANT
GLOVE BIOGEL PI ORTHO SZ9 (GLOVE) ×8 IMPLANT
GLOVE SURG SYN 7.5  E (GLOVE) ×1
GLOVE SURG SYN 7.5 E (GLOVE) ×1 IMPLANT
GLOVE SURG SYN 7.5 PF PI (GLOVE) ×1 IMPLANT
GLOVE SURG UNDER POLY LF SZ7.5 (GLOVE) ×1 IMPLANT
GOWN STRL REUS TWL 2XL XL LVL4 (GOWN DISPOSABLE) ×1 IMPLANT
GOWN STRL REUS W/ TWL LRG LVL3 (GOWN DISPOSABLE) ×1 IMPLANT
GOWN STRL REUS W/ TWL LRG LVL4 (GOWN DISPOSABLE) ×1 IMPLANT
GOWN STRL REUS W/ TWL XL LVL3 (GOWN DISPOSABLE) ×1 IMPLANT
GOWN STRL REUS W/TWL 2XL LVL3 (GOWN DISPOSABLE) ×1 IMPLANT
GOWN STRL REUS W/TWL LRG LVL3 (GOWN DISPOSABLE) ×1
GOWN STRL REUS W/TWL LRG LVL4 (GOWN DISPOSABLE) ×1
GOWN STRL REUS W/TWL XL LVL3 (GOWN DISPOSABLE) ×1
HOLDER FOLEY CATH W/STRAP (MISCELLANEOUS) ×1 IMPLANT
IMMBOLIZER KNEE 19 BLUE UNIV (SOFTGOODS) ×1 IMPLANT
IV NS IRRIG 3000ML ARTHROMATIC (IV SOLUTION) ×1 IMPLANT
KIT TURNOVER KIT A (KITS) ×1 IMPLANT
MANIFOLD NEPTUNE II (INSTRUMENTS) ×2 IMPLANT
NDL SAFETY ECLIP 18X1.5 (MISCELLANEOUS) ×1 IMPLANT
NDL SPNL 20GX3.5 QUINCKE YW (NEEDLE) ×1 IMPLANT
NEEDLE HYPO 22GX1.5 SAFETY (NEEDLE) ×1 IMPLANT
NEEDLE SPNL 20GX3.5 QUINCKE YW (NEEDLE) ×1 IMPLANT
NS IRRIG 1000ML POUR BTL (IV SOLUTION) ×1 IMPLANT
PACK TOTAL KNEE (MISCELLANEOUS) ×1 IMPLANT
PAD ABD DERMACEA PRESS 5X9 (GAUZE/BANDAGES/DRESSINGS) ×2 IMPLANT
PAD WRAPON POLAR KNEE (MISCELLANEOUS) ×1 IMPLANT
PATELLA DOME PFC 35MM (Knees) IMPLANT
PENCIL SMOKE EVACUATOR COATED (MISCELLANEOUS) ×1 IMPLANT
PIN DRILL FIX HALF THREAD (BIT) ×2 IMPLANT
PIN DRILL QUICK PACK ×2 IMPLANT
PIN FIXATION 1/8DIA X 3INL (PIN) ×2 IMPLANT
PLATE ROT INSERT 10MM SIZE 4 (Plate) IMPLANT
PULSAVAC PLUS IRRIG FAN TIP (DISPOSABLE) ×1
STAPLER SKIN PROX 35W (STAPLE) ×1 IMPLANT
SUCTION FRAZIER HANDLE 10FR (MISCELLANEOUS) ×1
SUCTION TUBE FRAZIER 10FR DISP (MISCELLANEOUS) ×1 IMPLANT
SUT ETHIBOND NAB CT1 #1 30IN (SUTURE) ×2 IMPLANT
SUT VIC AB 0 CT1 36 (SUTURE) ×1 IMPLANT
SUT VIC AB 2-0 CT1 (SUTURE) ×2 IMPLANT
SYR 20ML LL LF (SYRINGE) ×1 IMPLANT
SYR 30ML LL (SYRINGE) ×2 IMPLANT
TIBIA MBT CEMENT (Knees) ×1 IMPLANT
TIP FAN IRRIG PULSAVAC PLUS (DISPOSABLE) ×1 IMPLANT
TOWER CARTRIDGE SMART MIX (DISPOSABLE) ×1 IMPLANT
TRAP FLUID SMOKE EVACUATOR (MISCELLANEOUS) ×1 IMPLANT
TRAY FOLEY MTR SLVR 16FR STAT (SET/KITS/TRAYS/PACK) ×1 IMPLANT
TUBE SUCT KAM VAC (TUBING) ×1 IMPLANT
WATER STERILE IRR 500ML POUR (IV SOLUTION) ×1 IMPLANT
WRAPON POLAR PAD KNEE (MISCELLANEOUS) ×1

## 2021-12-10 NOTE — Anesthesia Procedure Notes (Signed)
Spinal  Patient location during procedure: OR Start time: 12/10/2021 7:57 AM End time: 12/10/2021 8:06 AM Reason for block: surgical anesthesia Staffing Performed: resident/CRNA  Resident/CRNA: Cammie Sickle, CRNA Performed by: Cammie Sickle, CRNA Authorized by: Dimas Millin, MD   Preanesthetic Checklist Completed: patient identified, IV checked, site marked, risks and benefits discussed, surgical consent, monitors and equipment checked, pre-op evaluation and timeout performed Spinal Block Patient position: sitting Prep: ChloraPrep Patient monitoring: heart rate, continuous pulse ox and blood pressure Approach: midline Location: L3-4 Injection technique: single-shot Needle Needle type: Introducer and Pencan  Needle gauge: 24 G Needle length: 10 cm Assessment Sensory level: T6 Events: CSF return Additional Notes Negative paresthesia. Negative blood return. Positive free-flowing CSF. Expiration date of kit checked and confirmed. Patient tolerated procedure well, without complications. Successful on first attempt. Pt. Tolerated procedure well, no complications noted.

## 2021-12-10 NOTE — Anesthesia Preprocedure Evaluation (Signed)
Anesthesia Evaluation  Patient identified by MRN, date of birth, ID band Patient awake    Reviewed: Allergy & Precautions, NPO status , Patient's Chart, lab work & pertinent test results  History of Anesthesia Complications Negative for: history of anesthetic complications  Airway Mallampati: III  TM Distance: >3 FB Neck ROM: Full    Dental  (+) Dental Advidsory Given, Teeth Intact   Pulmonary sleep apnea and Continuous Positive Airway Pressure Ventilation , neg COPD,    breath sounds clear to auscultation- rhonchi (-) wheezing      Cardiovascular hypertension, Pt. on medications (-) angina+ CAD, + Past MI and + Cardiac Stents  (-) CABG  Rhythm:Regular Rate:Normal - Systolic murmurs and - Diastolic murmurs    Neuro/Psych neg Seizures PSYCHIATRIC DISORDERS Depression negative neurological ROS     GI/Hepatic negative GI ROS, Neg liver ROS,   Endo/Other  diabetes, Oral Hypoglycemic AgentsHypothyroidism   Renal/GU negative Renal ROS     Musculoskeletal  (+) Arthritis ,   Abdominal (+) + obese,   Peds  Hematology negative hematology ROS (+)   Anesthesia Other Findings Past Medical History: No date: Coronary artery disease     Comment:  a. 07/[place] ([place] LM nl, LAD 100p (thrombectomy               & 3.5x23 Alpine DES), LCX nl, RCA nl, EF 45%. No date: Depression No date: Diastolic dysfunction     Comment:  a.) TTE 11/26/2020 - LVEF 50-55%; G2DD. No date: Endometrial cancer ([place]) No date: Gravida 3 para 3 No date: Hyperlipidemia No date: Hypertension No date: Hypertensive heart disease No date: Hypothyroid No date: Ischemic cardiomyopathy     Comment:  a.) 07/2013 EF 45% @ time of STEMI; b.) 11/2013 Echo: EF               55-60%, mildly dil LA, nl RV fxn. c.) TTE 07/29/2015 -               LVEF 55-60%, mild LA and RV dilation. d.) TTE 11/26/2020               - LVEF 50-55%; G2DD. No date: Menopause No  date: Obstructive sleep apnea on CPAP No date: Osteoarthritis 08/14/2013: ST elevation myocardial infarction (STEMI) of  anterolateral wall (HCC)     Comment:  a.) LVEF reduced at 45%; Tx'd with trombectomy and PCI               to 100% LAD; 3.5 x 23 mm Xience Alpine DES placed. No date: T2DM (type 2 diabetes mellitus) ([place]) No date: Thyroid cancer ([place])     Comment:  a.) s/p total thyroidectomy. b.) on daily levothyroxine.   Reproductive/Obstetrics                             Lab Results  Component Value Date   WBC 5.9 11/30/2021   HGB 11.7 (L) 11/30/2021   HCT 37.6 11/30/2021   MCV 96.4 11/30/2021   PLT 199 11/30/2021    Anesthesia Physical  Anesthesia Plan  ASA: 3  Anesthesia Plan: Spinal   Post-op Pain Management:    Induction:   PONV Risk Score and Plan: 2 and Propofol infusion  Airway Management Planned: Natural Airway  Additional Equipment:   Intra-op Plan:   Post-operative Plan:   Informed Consent: I have reviewed the patients History and Physical, chart, labs and discussed the procedure including the risks,  benefits and alternatives for the proposed anesthesia with the patient or authorized representative who has indicated his/her understanding and acceptance.     Dental advisory given  Plan Discussed with: CRNA and Anesthesiologist  Anesthesia Plan Comments: (Patient reports no bleeding problems and no anticoagulant use.  Plan for spinal with backup GA  Patient consented for risks of anesthesia including but not limited to:  - adverse reactions to medications - damage to eyes, teeth, lips or other oral mucosa - nerve damage due to positioning  - risk of bleeding, infection and or nerve damage from spinal that could lead to paralysis - risk of headache or failed spinal - damage to teeth, lips or other oral mucosa - sore throat or hoarseness - damage to heart, brain, nerves, lungs, other parts of body or loss of  life  Patient voiced understanding.)        Anesthesia Quick Evaluation

## 2021-12-10 NOTE — Op Note (Signed)
DATE OF SURGERY:  12/10/2021 TIME: 11:29 AM  PATIENT NAME:  [name]   AGE: 76 y.o.    PRE-OPERATIVE DIAGNOSIS:  Left Knee Osteoarthritis  POST-OPERATIVE DIAGNOSIS:  Same  PROCEDURE:  Procedure(s): LEFT TOTAL KNEE ARTHROPLASTY  SURGEON:  Thornton Park, MD   ASSISTANT:  Danae Orleans, PA  OPERATIVE IMPLANTS: Depuy PFC Sigma, Posterior Stabilized Femural component size 4 narrow, Tibia size rotating platform component size 3, Patella polyethylene 3-peg oval button size 35, with a 10 mm polyethylene insert.  EBL:  50 cc  TOURNIQUET TIME:  110 minutes  PREOPERATIVE INDICATIONS:  [name] is an 76 y.o. female who has a diagnosis of  Left Knee Osteoarthritis and elected for a right total knee arthroplasty after failing nonoperative treatment.  Their knee pain significantly impacts their activity of daily living.  Radiographs have demonstrated tricompartmental osteoarthritis joint space narrowing, osteophytes, subchondral sclerosis and cyst formation.  The risks, benefits, and alternatives were discussed at length including but not limited to the risks of infection, bleeding, nerve or blood vessel injury, knee stiffness, fracture, dislocation, loosening or failure of the hardware and the need for further surgery. Medical risks include but not limited to DVT and pulmonary embolism, myocardial infarction, stroke, pneumonia, respiratory failure and death. I discussed these risks with the patient in my office prior to the date of surgery. They understood these risks and were willing to proceed.  OPERATIVE FINDINGS AND UNIQUE ASPECTS OF THE CASE:  Advanced tricompartmental osteoarthritis  OPERATIVE DESCRIPTION:  The patient was brought to the operative room and placed in a supine position after undergoing placement of a spinal anesthetic.  A Foley catheter was placed.  IV antibiotics were given. Patient received 2 grams of ancef IV and tranexamic acid 100 mg IV prior to  inflation of the tourniquet. The lower extremity was prepped and draped in the usual sterile fashion.  A time out was performed to verify the patient's name, date of birth, medical record number, correct site of surgery and correct procedure to be performed. The timeout was also used to confirm the patient received antibiotics and that appropriate instruments, implants and radiographs studies were available in the room.  The leg was elevated and exsanguinated with an Esmarch and the tourniquet was inflated to 275 mmHg for 110 minutes.  Patient's preop range of motion was -15 to 20 degrees of contracture to approximately 95 degrees of flexion..  A midline incision was made over the left knee. Full-thickness skin flaps were developed. A medial parapatellar arthrotomy was then made and the patella everted and the knee was brought into 90 of flexion. Hoffa's fat pad along with the cruciate ligaments and medial and lateral menisci were resected.   The distal femoral intramedullary canal was opened with a drill and the intramedullary distal femoral cutting jig was inserted into the femoral canal pinned into position. It was set at 5 degrees resecting 11 mm off the distal femur and her significant flexion contracture.  Care was taken to protect the collateral ligaments during distal femoral resection.  The distal femoral resection was performed with an oscillating saw. The femoral cutting guide was then removed.  The extramedullary tibial cutting guide was then placed using the anterior tibial crest and second ray of the foot as a references.  The tibial cutting guide was adjusted to allow for appropriate posterior slope.  The tibial cutting block was pinned into position. The slotted stylus was used to measure the proximal tibial resection of 10  mm off the high lateral side.  The tibial long rod alignment guide was then used to confirm position of the cutting block. A third cross pin through the tibial cutting  block was then drilled into position to allow for rotational stability. Care was taken during the tibial resection to protect the medial and collateral ligaments.  The resected tibial bone was removed along with the posterior horns of the menisci.  The PCL was sacrificed.  Extension gap was measured with a spacer block and alignment and extension was confirmed using a long alignment rod.  The attention was then turned back to the femur. The posterior referencing distal femoral sizing guide was applied to the distal femur.  The femur was sized to be a size 4 narrow. Rotation of the referencing guide was checked with the epicondylar axis and Whitesides line. Then the 4-in-1 cutting jig was then applied to the distal femur. A stylus was used to confirm that the anterior femur would not be notched.   Then the anterior, posterior and chamfer femoral cuts were then made with an oscillating saw.  The flexion gap was then measured with a flexion spacer block and long alignment rod and was found to be symmetric with the extension gap and perpendicular to mechanical axis of the tibia.  The distal femoral preparation was completed by performing the posterior stabilized box cut using the cutting block. The entry site for the intramedullary femoral guide was filled with autologous bone graft from bone previously resected earlier in the case.  The proximal tibia plateau was then sized with trial trays. The best coverage was achieved with a size 3. This tibial tray was then pinned into position. The proximal tibia was then prepared with the reamer and keel punch.  After tibial preparation was completed, all trial components were inserted with polyethylene trials.  The knee was found to have excellent balance and full motion with a size 10 mm tibial polyethylene insert..    The attention was then turned to preparation of the patella. The thickness of the patella was measured with a caliper, the diameter measured with the  patella templates.  The patella resection was then made with an oscillating saw using the patella cutting guide.  The final patellar implant diameter was 35 mm.  3 peg holes for the patella component were then drilled. The trial patella was then placed. Knee was taken through a full range of motion and deemed to be stable with the trial components. All trial components were then removed. The knee capsule was then injected with Exparel.  The knee joint capsule was injected with a mixture of quarter percent Marcaine, Toradol and morphine to assist with postoperative pain relief.  The joint was copiously irrigated with pulse lavage.  The final total knee arthroplasty components were then cemented into place with a 10 mm trial polyethylene insert and all excess methylmethacrylate was removed.  The joint was again copiously irrigated. After the cement had hardened the knee was again taken through a full range of motion. It was felt to be most stable with the 10 mm tibial polyethylene insert. The actual tibial polyethylene insert was then placed.   The knee was taken through a range of motion and the patella tracked well and the knee was again irrigated copiously.    The medial arthrotomy was closed with #1 Ethibond. The subcutaneous tissue closed with 0 and 2-0 vicryl, and skin approximated with staples.  A dry sterile and compressive dressing was applied.  A Polar Care was applied to the operative knee along with a knee immobilizer.  The patient was awakened and brought to the PACU in stable and satisfactory condition.  All sharp, lap and instrument counts were correct at the conclusion the case. I spoke with the patient's family in the postop consultation room to let them know the case had been performed without complication and the patient was stable in recovery room.

## 2021-12-10 NOTE — Transfer of Care (Signed)
Immediate Anesthesia Transfer of Care Note  Patient: Elyza M Shippy  Procedure(s) Performed: TOTAL KNEE ARTHROPLASTY (Left: Knee)  Patient Location: PACU  Anesthesia Type:Spinal  Level of Consciousness: awake  Airway & Oxygen Therapy: Patient Spontanous Breathing and Patient connected to face mask oxygen  Post-op Assessment: Report given to RN and Post -op Vital signs reviewed and stable  Post vital signs: Reviewed and stable  Last Vitals:  Vitals Value Taken Time  BP 116/54 12/10/21 1118  Temp 35.9 1118  Pulse 85 12/10/21 1123  Resp 14 12/10/21 1123  SpO2 92 % 12/10/21 1123  Vitals shown include unvalidated device data.  Last Pain:  Vitals:   12/10/21 0615  TempSrc: Temporal  PainSc: 0-No pain         Complications: No notable events documented.

## 2021-12-10 NOTE — H&P (Signed)
PREOPERATIVE H&P  Chief Complaint: Left Knee Osteoarthritis  HPI: [name] is a 76 y.o. female who presents for preoperative history and physical with a diagnosis of Left Knee Osteoarthritis. Her left knee pain is impairing activities of daily living including her ability to ambulate.  She has failed nonoperative management wished to proceed with a left total knee arthroplasty.  She has undergone a successful right total knee arthroplasty by me on 12/18/2020.  Her x-rays show advanced tricompartmental osteoarthritis of the left knee including joint space narrowing, marginal osteophytes and subchondral sclerosis.   Past Medical History:  Diagnosis Date   Coronary artery disease    a. [place]l, LAD 100p (thrombectomy & 3.5x23 Alpine DES), LCX nl, RCA nl, EF 45%.   Depression    Diastolic dysfunction    a.) TTE 11/26/2020 - LVEF 50-55%; G2DD.   Endometrial cancer ([place])    Gravida 3 para 3    Hyperlipidemia    Hypertension    Hypertensive heart disease    Hypothyroid    Ischemic cardiomyopathy    a.) 07/2013 EF 45% @ time of STEMI; b.) 11/2013 Echo: EF 55-60%, mildly dil LA, nl RV fxn. c.) TTE 07/29/2015 - LVEF 55-60%, mild LA and RV dilation. d.) TTE 11/26/2020 - LVEF 50-55%; G2DD.   Menopause    Obstructive sleep apnea on CPAP    Osteoarthritis    PONV (postoperative nausea and vomiting)    ST elevation myocardial infarction (STEMI) of anterolateral wall ([place]) 08/14/2013   a.) LVEF reduced at 45%; Tx'd with trombectomy and PCI to 100% LAD; 3.5 x 23 mm Xience Alpine DES placed.   T2DM (type 2 diabetes mellitus) ([place])    Thyroid cancer ([place])    a.) s/p total thyroidectomy. b.) on daily levothyroxine.   Past Surgical History:  Procedure Laterality Date   CATARACT EXTRACTION W/PHACO Left 08/30/2016   Procedure: CATARACT EXTRACTION PHACO AND INTRAOCULAR LENS PLACEMENT ([place]) Left daibetic;  Surgeon: Leandrew Koyanagi, MD;  Location: [facility];   Service: Ophthalmology;  Laterality: Left;  Diabetic - insulin and oral meds sleep apnea   CATARACT EXTRACTION W/PHACO Right 10/07/2016   Procedure: CATARACT EXTRACTION PHACO AND INTRAOCULAR LENS PLACEMENT ([facility]);  Surgeon: Leandrew Koyanagi, MD;  Location: [facility];  Service: Ophthalmology;  Laterality: Right;  Lot# [id number] H [place]: 00:43.3 AP%: 12.2 CDE: 5.28   CORONARY ANGIOPLASTY WITH STENT PLACEMENT Left 08/14/2013   Procedure: CORONARY ANGIOPLASTY WITH STENT PLACEMENT (3.5 x 23 mm Xience Alpine DES to LAD); Location: [facility], [place]; Surgeon: Algernon Huxley, MD   GANGLION CYST EXCISION Right [place]   total   TOTAL KNEE ARTHROPLASTY Right 12/18/2020   Procedure: RIGHT TOTAL KNEE ARTHROPLASTY;  Surgeon: Thornton Park, MD;  Location: [facility];  Service: Orthopedics;  Laterality: Right;   Social History   Socioeconomic History   Marital status: Widowed    Spouse name: Not on file   Number of children: 3   Years of education: Not on file   Highest education level: Not on file  Occupational History   Occupation: retired  Tobacco Use   Smoking status: Never   Smokeless tobacco: Never  Vaping Use   Vaping Use: Never used  Substance and Sexual Activity   Alcohol use: No   Drug use: No   Sexual activity: Not on file  Other Topics Concern   Not on file  Social History Narrative  Not on file   Social Determinants of Health   Financial Resource Strain: Low Risk  (03/27/2021)   Overall Financial Resource Strain (CARDIA)    Difficulty of Paying Living Expenses: Not hard at all  Food Insecurity: No Food Insecurity (03/27/2021)   Hunger Vital Sign    Worried About Running Out of Food in the Last Year: Never true    Ran Out of Food in the Last Year: Never true  Transportation Needs: No Transportation Needs (03/27/2021)   PRAPARE - Hydrologist (Medical): No    Lack of Transportation  (Non-Medical): No  Physical Activity: Insufficiently Active (03/27/2021)   Exercise Vital Sign    Days of Exercise per Week: 3 days    Minutes of Exercise per Session: 30 min  Stress: No Stress Concern Present (03/27/2021)   [place]    Feeling of Stress : Only a little  Social Connections: Moderately Integrated (03/27/2021)   Social Connection and Isolation Panel [NHANES]    Frequency of Communication with Friends and Family: More than three times a week    Frequency of Social Gatherings with Friends and Family: More than three times a week    Attends Religious Services: More than 4 times per year    Active Member of [employer] or Organizations: Yes    Attends Archivist Meetings: More than 4 times per year    Marital Status: Widowed   Family History  Problem Relation Age of Onset   Heart attack Father    Heart failure Father    Hypertension Father    Hyperlipidemia Father    Heart disease Father 66       CABG    Pulmonary fibrosis Son    Breast cancer Neg Hx    No Known Allergies Prior to Admission medications   Medication Sig Start Date End Date Taking? Authorizing Provider  ACCU-CHEK AVIVA PLUS test strip U UTD BID 11/10/16  Yes Karamalegos, Devonne Doughty, DO  acetaminophen (TYLENOL) 650 MG CR tablet Take 1,950 mg by mouth daily.   Yes [provider]  amLODipine (NORVASC) 2.5 MG tablet TAKE 1 TABLET BY MOUTH DAILY 06/16/21  Yes Gollan, Kathlene November, MD  carvedilol (COREG) 3.125 MG tablet TAKE 1 TABLET BY MOUTH TWICE  DAILY Patient taking differently: daily. 3.125 07/28/21  Yes Gollan, Kathlene November, MD  ezetimibe (ZETIA) 10 MG tablet TAKE 1 TABLET BY MOUTH DAILY 07/02/21  Yes Gollan, Kathlene November, MD  FLUoxetine (PROZAC) 10 MG capsule TAKE 1 CAPSULE BY MOUTH DAILY 12/07/21  Yes Karamalegos, Devonne Doughty, DO  fluticasone (FLONASE) 50 MCG/ACT nasal spray Place 2 sprays into both nostrils daily. Patient taking  differently: Place 2-3 sprays into both nostrils at bedtime as needed. 02/18/21  Yes Brunetta Jeans, PA-C  levothyroxine (SYNTHROID) 125 MCG tablet TAKE 1 TABLET BY MOUTH  DAILY BEFORE BREAKFAST 09/29/21  Yes Karamalegos, Devonne Doughty, DO  losartan (COZAAR) 100 MG tablet TAKE 1 TABLET BY MOUTH DAILY 08/17/21  Yes Gollan, Kathlene November, MD  rosuvastatin (CRESTOR) 20 MG tablet TAKE 1 TABLET BY MOUTH DAILY 07/28/21  Yes Gollan, Kathlene November, MD  aspirin EC 81 MG tablet Take 81 mg by mouth daily. Swallow whole.    [provider]  fexofenadine (ALLEGRA) 180 MG tablet Take 180 mg by mouth daily as needed for allergies or rhinitis. Patient not taking: Reported on 11/30/2021    [provider]  metFORMIN (GLUCOPHAGE-XR) 500  MG 24 hr tablet TAKE 3 TABLETS BY MOUTH  DAILY WITH BREAKFAST 07/28/21   Karamalegos, Devonne Doughty, DO  nitroGLYCERIN (NITROSTAT) 0.4 MG SL tablet Place 1 tablet (0.4 mg total) under the tongue every 5 (five) minutes as needed for chest pain. 01/29/20   Gollan, Kathlene November, MD  OZEMPIC, 1 MG/DOSE, 4 MG/3ML SOPN INJECT SUBCUTANEOUSLY 1 MG EVERY WEEK Patient taking differently: Inject 1 mg into the skin once a week. sunday 11/23/21   Olin Hauser, DO     Positive ROS: All other systems have been reviewed and were otherwise negative with the exception of those mentioned in the HPI and as above.  Physical Exam: General: Alert, no acute distress Cardiovascular: Regular rate and rhythm, no murmurs rubs or gallops.  No pedal edema Respiratory: Clear to auscultation bilaterally, no wheezes rales or rhonchi. No cyanosis, no use of accessory musculature GI: No organomegaly, abdomen is soft and non-tender nondistended with positive bowel sounds. Skin: Skin intact, no lesions within the operative field. Neurologic: Sensation intact distally Psychiatric: Patient is competent for consent with normal mood and affect Lymphatic: No cervical lymphadenopathy  MUSCULOSKELETAL: Left  knee: Skin is intact.  Patient has approximately a 15 degree flexion contracture.  She can flex to approximately 100 - 110 degrees.  Patient has patellofemoral crepitus and mild grind but no apprehension.  She has no calf tenderness or lower leg edema.  She is distally neurovascular intact and has no ligamentous laxity.  Assessment: Left Knee Osteoarthritis  Plan: Plan for Procedure(s): LEFT TOTAL KNEE ARTHROPLASTY  She was seen in the preoperative area with family members at the bedside.  Patient has been through the procedure performed but I reviewed the details of the operation as well as the postoperative course.  A preop history and physical was performed at the bedside this morning.  I marked the left knee according to hospital's correct site of surgery protocol.  Her right knee has already been replaced.  I discussed the risks and benefits of surgery. The risks include but are not limited to infection requiring the removal of the prosthesis, bleeding requiring blood transfusion, nerve or blood vessel injury, joint stiffness or loss of motion, persistent left knee pain, weakness or instability, fracture, dislocation, hardware failure or loosening and the need for further surgery. Medical risks include but are not limited to DVT and pulmonary embolism, myocardial infarction, stroke, pneumonia, respiratory failure and death. Patient understood these risks and wished to proceed.     Thornton Park, MD   12/10/2021 7:51 AM

## 2021-12-10 NOTE — Progress Notes (Signed)
  Subjective:  POST OP CHECK s/p left total knee arthroplasty.   Patient reports the left knee pain as mild.  She has no other complaints.  Patient is staying overnight in the preoperative area.  Objective:   VITALS:   Vitals:   12/10/21 1335 12/10/21 1430 12/10/21 1530 12/10/21 1552  BP:  124/61 126/63 122/63  Pulse: 85 85 75 78  Resp: 19     Temp:  97.7 F (36.5 C) 97.8 F (36.6 C) 97.9 F (36.6 C)  TempSrc:    Oral  SpO2: 96% 94% 95% 97%  Weight:      Height:        PHYSICAL EXAM: Left extremity Neurovascular intact Sensation intact distally Intact pulses distally Dorsiflexion/Plantar flexion intact Incision: dressing C/D/I No cellulitis present Compartment soft  LABS  Results for orders placed or performed during the hospital encounter of 12/10/21 (from the past 24 hour(s))  Glucose, capillary     Status: Abnormal   Collection Time: 12/10/21  6:18 AM  Result Value Ref Range   Glucose-Capillary 136 (H) 70 - 99 mg/dL  Glucose, capillary     Status: Abnormal   Collection Time: 12/10/21 11:34 AM  Result Value Ref Range   Glucose-Capillary 201 (H) 70 - 99 mg/dL    DG Knee Left Port  Result Date: 12/10/2021 CLINICAL DATA:  Status post left total knee arthroplasty. EXAM: PORTABLE LEFT KNEE - 1-2 VIEW COMPARISON:  None Available. FINDINGS: Status post left total knee arthroplasty with well-positioned intact hardware. Expected gas within and anterior to the left knee joint. No dislocation. No focal osseous lesions. Midline anterior left knee vertical skin staples. IMPRESSION: Satisfactory immediate postoperative appearance status post left total knee arthroplasty. Electronically Signed   By: Ilona Sorrel M.D.   On: 12/10/2021 12:08    Assessment/Plan: Day of Surgery   Principal Problem:   S/P TKR (total knee replacement) using cement, left  Patient's pain is well controlled.  She did well with physical therapy earlier today.  I reviewed the patient's postoperative  x-rays which demonstrate the total knee arthroplasty prosthesis is well-positioned.  There is no evidence of fracture, dislocation, loosening or other postop complication.  Patient will continue physical therapy tomorrow.  She will have labs checked in the morning.  Her Foley catheter will be removed in the morning.  Patient will begin Lovenox tomorrow.  Patient may be ready for discharge home tomorrow unless her condition changes overnight.    Thornton Park , MD 12/10/2021, 6:38 PM

## 2021-12-10 NOTE — Evaluation (Signed)
Physical Therapy Evaluation Patient Details Name: [name] MRN: [id number] DOB: February 14, 1946 Today's Date: 12/10/2021  History of Present Illness  Pt is a 76 y.o. female s/p L TKA 12/10/21.  Clinical Impression  Pt admitted with above diagnosis. Pt received sitting upright agreeable to PT. Pt displaying AROM in LLE and SLR on LLE in absence of KI. Pt reports being independent at baseline and has a cousin who is a nurse coming to stay with her at home during recovery. Pt performs bed mobility at supervision, stands at RW and ambulates with RW at 28' with minguard. Some min VC's  for turning RW due to IV line. No safety issues with RW. Pt understanding of HEP exercises due to prior R TKA and understands L knee positioning to prevent contracture. Pt returned to bed with KI donned. All needs in reach with RN at bedside. Plan for return home and OPPT pending progression of gait and stair training. Pt currently with functional limitations due to the deficits listed below (see PT Problem List). Pt will benefit from skilled PT to increase their independence and safety with mobility to allow discharge to the venue listed below.     Recommendations for follow up therapy are one component of a multi-disciplinary discharge planning process, led by the attending physician.  Recommendations may be updated based on patient status, additional functional criteria and insurance authorization.  Follow Up Recommendations Outpatient PT      Assistance Recommended at Discharge Frequent or constant Supervision/Assistance  Patient can return home with the following  A little help with walking and/or transfers;A little help with bathing/dressing/bathroom;Assistance with cooking/housework;Assist for transportation;Help with stairs or ramp for entrance    Equipment Recommendations None recommended by PT  Recommendations for Other Services       Functional Status Assessment Patient has had a recent decline in  their functional status and demonstrates the ability to make significant improvements in function in a reasonable and predictable amount of time.     Precautions / Restrictions Precautions Precautions: Knee Precaution Booklet Issued: No Required Braces or Orthoses: Knee Immobilizer - Left Knee Immobilizer - Left: Other (comment) (on except when working with rehab) Restrictions Weight Bearing Restrictions: Yes LLE Weight Bearing: Weight bearing as tolerated      Mobility  Bed Mobility Overal bed mobility: Needs Assistance Bed Mobility: Supine to Sit, Sit to Supine     Supine to sit: Supervision, HOB elevated Sit to supine: Supervision     Patient Response: Cooperative  Transfers Overall transfer level: Needs assistance Equipment used: Rolling walker (2 wheels) Transfers: Sit to/from Stand Sit to Stand: Min guard           General transfer comment: VC's for hand placement.    Ambulation/Gait Ambulation/Gait assistance: Min guard Gait Distance (Feet): 16 Feet Assistive device: Rolling walker (2 wheels) Gait Pattern/deviations: Step-to pattern, Decreased step length - right, Decreased stance time - left       General Gait Details: performing steady gait with RW. Limited in distance due to IV line.  Stairs            Wheelchair Mobility    Modified Rankin (Stroke Patients Only)       Balance Overall balance assessment: Needs assistance Sitting-balance support: No upper extremity supported, Feet supported Sitting balance-Leahy Scale: Good     Standing balance support: Bilateral upper extremity supported, During functional activity Standing balance-Leahy Scale: Fair Standing balance comment: use of RW  Pertinent Vitals/Pain Pain Assessment Pain Assessment: Faces Faces Pain Scale: Hurts a little bit Pain Intervention(s): Limited activity within patient's tolerance, Monitored during session, Repositioned, Ice  applied    Home Living Family/patient expects to be discharged to:: Private residence Living Arrangements: Alone Available Help at Discharge: Family Type of Home: House Home Access: Stairs to enter Entrance Stairs-Rails: Left Entrance Stairs-Number of Steps: 3   Home Layout: One level Home Equipment: Conservation officer, nature (2 wheels);BSC/3in1;Grab bars - tub/shower;Grab bars - toilet      Prior Function Prior Level of Function : Independent/Modified Independent                     Hand Dominance        Extremity/Trunk Assessment   Upper Extremity Assessment Upper Extremity Assessment: Defer to OT evaluation    Lower Extremity Assessment Lower Extremity Assessment: LLE deficits/detail LLE Deficits / Details: L TKA    Cervical / Trunk Assessment Cervical / Trunk Assessment: Normal  Communication   Communication: No difficulties  Cognition Arousal/Alertness: Awake/alert Behavior During Therapy: WFL for tasks assessed/performed Overall Cognitive Status: Within Functional Limits for tasks assessed                                          General Comments      Exercises Total Joint Exercises Ankle Circles/Pumps: AROM, Strengthening, Both, 10 reps, Supine Quad Sets: AROM, Strengthening, Left, 10 reps, Supine Short Arc Quad: AROM, Strengthening, Supine, Left, 10 reps Heel Slides: AROM, Strengthening, Left, 10 reps, Supine Hip ABduction/ADduction: AROM, Supine, Strengthening, Left, 10 reps Long Arc Quad: AROM, Supine, Strengthening, Left, 10 reps Marching in Standing: AROM, Strengthening, Both, 5 reps Other Exercises Other Exercises: Role of PT in acute setting, d/c recs, WB precautions, LLE positioning to prevent joint contracture, HEP, POC.   Assessment/Plan    PT Assessment Patient needs continued PT services  PT Problem List Decreased strength;Decreased mobility;Decreased range of motion;Decreased balance       PT Treatment Interventions  DME instruction;Therapeutic exercise;Gait training;Balance training;Stair training;Neuromuscular re-education;Functional mobility training;Therapeutic activities;Patient/family education    PT Goals (Current goals can be found in the Care Plan section)  Acute Rehab PT Goals Patient Stated Goal: To return home PT Goal Formulation: With patient Time For Goal Achievement: 12/24/21 Potential to Achieve Goals: Good    Frequency BID     Co-evaluation               AM-PAC PT "6 Clicks" Mobility  Outcome Measure Help needed turning from your back to your side while in a flat bed without using bedrails?: A Little Help needed moving from lying on your back to sitting on the side of a flat bed without using bedrails?: A Little Help needed moving to and from a bed to a chair (including a wheelchair)?: A Little Help needed standing up from a chair using your arms (e.g., wheelchair or bedside chair)?: A Little Help needed to walk in hospital room?: A Little Help needed climbing 3-5 steps with a railing? : A Lot 6 Click Score: 17    End of Session Equipment Utilized During Treatment: Gait belt Activity Tolerance: Patient tolerated treatment well Patient left: in bed;with call bell/phone within reach;with bed alarm set Nurse Communication: Mobility status PT Visit Diagnosis: Unsteadiness on feet (R26.81);Other abnormalities of gait and mobility (R26.89);Muscle weakness (generalized) (M62.81)    Time: 2706-2376 PT  Time Calculation (min) (ACUTE ONLY): 26 min   Charges:   PT Evaluation $PT Eval Low Complexity: 1 Low PT Treatments $Therapeutic Exercise: 8-22 mins       Lakenya Riendeau M. Fairly IV, PT, DPT Physical Therapist- [facility]  12/10/2021, 2:49 PM

## 2021-12-11 ENCOUNTER — Encounter: Payer: Self-pay | Admitting: Orthopedic Surgery

## 2021-12-11 DIAGNOSIS — Z955 Presence of coronary angioplasty implant and graft: Secondary | ICD-10-CM | POA: Diagnosis not present

## 2021-12-11 DIAGNOSIS — Z7984 Long term (current) use of oral hypoglycemic drugs: Secondary | ICD-10-CM | POA: Diagnosis not present

## 2021-12-11 DIAGNOSIS — M1712 Unilateral primary osteoarthritis, left knee: Secondary | ICD-10-CM | POA: Diagnosis not present

## 2021-12-11 DIAGNOSIS — E119 Type 2 diabetes mellitus without complications: Secondary | ICD-10-CM | POA: Diagnosis not present

## 2021-12-11 DIAGNOSIS — Z85850 Personal history of malignant neoplasm of thyroid: Secondary | ICD-10-CM | POA: Diagnosis not present

## 2021-12-11 DIAGNOSIS — Z23 Encounter for immunization: Secondary | ICD-10-CM | POA: Diagnosis not present

## 2021-12-11 DIAGNOSIS — I2510 Atherosclerotic heart disease of native coronary artery without angina pectoris: Secondary | ICD-10-CM | POA: Diagnosis not present

## 2021-12-11 DIAGNOSIS — Z79899 Other long term (current) drug therapy: Secondary | ICD-10-CM | POA: Diagnosis not present

## 2021-12-11 DIAGNOSIS — Z7982 Long term (current) use of aspirin: Secondary | ICD-10-CM | POA: Diagnosis not present

## 2021-12-11 DIAGNOSIS — E039 Hypothyroidism, unspecified: Secondary | ICD-10-CM | POA: Diagnosis not present

## 2021-12-11 DIAGNOSIS — Z96651 Presence of right artificial knee joint: Secondary | ICD-10-CM | POA: Diagnosis not present

## 2021-12-11 DIAGNOSIS — I119 Hypertensive heart disease without heart failure: Secondary | ICD-10-CM | POA: Diagnosis not present

## 2021-12-11 LAB — CBC
HCT: 29.4 % — ABNORMAL LOW (ref 36.0–46.0)
Hemoglobin: 9.4 g/dL — ABNORMAL LOW (ref 12.0–15.0)
MCH: 30.4 pg (ref 26.0–34.0)
MCHC: 32 g/dL (ref 30.0–36.0)
MCV: 95.1 fL (ref 80.0–100.0)
Platelets: 178 10*3/uL (ref 150–400)
RBC: 3.09 MIL/uL — ABNORMAL LOW (ref 3.87–5.11)
RDW: 11.5 % (ref 11.5–15.5)
WBC: 9.9 10*3/uL (ref 4.0–10.5)
nRBC: 0 % (ref 0.0–0.2)

## 2021-12-11 LAB — BASIC METABOLIC PANEL
Anion gap: 7 (ref 5–15)
BUN: 19 mg/dL (ref 8–23)
CO2: 21 mmol/L — ABNORMAL LOW (ref 22–32)
Calcium: 8.1 mg/dL — ABNORMAL LOW (ref 8.9–10.3)
Chloride: 108 mmol/L (ref 98–111)
Creatinine, Ser: 0.93 mg/dL (ref 0.44–1.00)
GFR, Estimated: >60 mL/min (ref >60)
Glucose, Bld: 229 mg/dL — ABNORMAL HIGH (ref 70–99)
Potassium: 4.1 mmol/L (ref 3.5–5.1)
Sodium: 136 mmol/L (ref 135–145)

## 2021-12-11 MED ORDER — TRAMADOL HCL 50 MG PO TABS
ORAL_TABLET | ORAL | Status: AC
  Administered 2021-12-11: 50 mg via ORAL
  Filled 2021-12-11: qty 1

## 2021-12-11 MED ORDER — DOCUSATE SODIUM 100 MG PO CAPS
ORAL_CAPSULE | ORAL | Status: AC
  Administered 2021-12-11: 100 mg via ORAL
  Filled 2021-12-11: qty 1

## 2021-12-11 MED ORDER — ASPIRIN 325 MG PO TBEC: 325.0000 mg | DELAYED_RELEASE_TABLET | Freq: Two times a day (BID) | ORAL | 0 refills | Status: DC

## 2021-12-11 MED ORDER — BISACODYL 5 MG PO TBEC: 10.0000 mg | DELAYED_RELEASE_TABLET | Freq: Every day | ORAL | 0 refills | Status: DC | PRN

## 2021-12-11 MED ORDER — METHOCARBAMOL 500 MG PO TABS
ORAL_TABLET | ORAL | Status: AC
  Administered 2021-12-11: 500 mg via ORAL
  Filled 2021-12-11: qty 1

## 2021-12-11 MED ORDER — ENOXAPARIN SODIUM 40 MG/0.4ML IJ SOSY
PREFILLED_SYRINGE | INTRAMUSCULAR | Status: AC
  Administered 2021-12-11: 40 mg via SUBCUTANEOUS
  Filled 2021-12-11: qty 0.4

## 2021-12-11 MED ORDER — OXYCODONE HCL 5 MG PO TABS
ORAL_TABLET | ORAL | Status: AC
  Administered 2021-12-11: 2.5 mg via ORAL
  Filled 2021-12-11: qty 1

## 2021-12-11 MED ORDER — ACETAMINOPHEN 500 MG PO TABS
ORAL_TABLET | ORAL | Status: AC
  Administered 2021-12-11: 1000 mg via ORAL
  Filled 2021-12-11: qty 2

## 2021-12-11 MED ORDER — ASPIRIN 81 MG PO CHEW
CHEWABLE_TABLET | ORAL | Status: AC
  Administered 2021-12-11: 81 mg via ORAL
  Filled 2021-12-11: qty 1

## 2021-12-11 MED ORDER — METFORMIN HCL ER 500 MG PO TB24
1500.0000 mg | ORAL_TABLET | Freq: Every day | ORAL | Status: DC
  Administered 2021-12-11: 1000 mg via ORAL
  Filled 2021-12-11: qty 3

## 2021-12-11 MED ORDER — DOCUSATE SODIUM 100 MG PO CAPS: 100.0000 mg | ORAL_CAPSULE | Freq: Two times a day (BID) | ORAL | 0 refills | Status: DC

## 2021-12-11 MED ORDER — OXYCODONE HCL 5 MG PO TABS: 5.0000 mg | ORAL_TABLET | ORAL | 0 refills | Status: DC | PRN

## 2021-12-11 MED ORDER — PANTOPRAZOLE SODIUM 40 MG PO TBEC
DELAYED_RELEASE_TABLET | ORAL | Status: AC
  Administered 2021-12-11: 40 mg via ORAL
  Filled 2021-12-11: qty 1

## 2021-12-11 NOTE — Anesthesia Postprocedure Evaluation (Signed)
Anesthesia Post Note  Patient: [name]  Procedure(s) Performed: TOTAL KNEE ARTHROPLASTY (Left: Knee)  Patient location during evaluation: Nursing Unit Anesthesia Type: Spinal Level of consciousness: awake Respiratory status: spontaneous breathing Cardiovascular status: stable Postop Assessment: no headache Anesthetic complications: no   No notable events documented.   Last Vitals:  Vitals:   12/10/21 2019 12/11/21 0615  BP: (!) 142/59 (!) 132/52  Pulse: 86 67  Resp: 16 16  Temp: (!) 36.3 C 36.6 C  SpO2: 94% 97%    Last Pain:  Vitals:   12/11/21 0644  TempSrc:   PainSc: 0-No pain                 Lerry Liner

## 2021-12-11 NOTE — Evaluation (Signed)
Occupational Therapy Evaluation Patient Details Name: [name] MRN: [id number] DOB: 1945/10/04 Today's Date: 12/11/2021   History of Present Illness Pt is a 76 y.o. female s/p L TKA 12/10/21. PMHx R TKA 11/2020.   Clinical Impression   Pt seen for OT evaluation this date, POD#1 from above surgery. Pt was independent in all ADL prior to surgery and is eager to return to PLOF with less pain and improved safety and independence. She reports her cousin is going to stay with her for ~1wk after surgery to assist as needed. Pt currently requires minimal assist for LB dressing while in seated position due to pain and limited AROM of * knee. Pt instructed in polar care mgt, falls prevention strategies, home/routines modifications, DME/AE for LB bathing and dressing tasks, positioning of L knee for sleep and rest, activity pacing, and compression stocking mgt. Pt verbalized understanding, able to recall some strategies she previously used for R TKA recovery. Handout provided to support recall and carryover. Do not currently anticipate any OT needs following this hospitalization.      Recommendations for follow up therapy are one component of a multi-disciplinary discharge planning process, led by the attending physician.  Recommendations may be updated based on patient status, additional functional criteria and insurance authorization.   Follow Up Recommendations  No OT follow up    Assistance Recommended at Discharge PRN  Patient can return home with the following A little help with bathing/dressing/bathroom;Assistance with cooking/housework;Assist for transportation    Functional Status Assessment  Patient has had a recent decline in their functional status and demonstrates the ability to make significant improvements in function in a reasonable and predictable amount of time.  Equipment Recommendations  None recommended by OT    Recommendations for Other Services       Precautions /  Restrictions Precautions Precautions: Knee Precaution Booklet Issued: Yes (comment) Required Braces or Orthoses: Knee Immobilizer - Left Knee Immobilizer - Left: Other (comment) Restrictions Weight Bearing Restrictions: Yes LLE Weight Bearing: Weight bearing as tolerated      Mobility Bed Mobility               General bed mobility comments: pt declined, recently up with PT requiring only supv    Transfers                   General transfer comment: pt declined, recently up with PT, requiring only supv      Balance                                           ADL either performed or assessed with clinical judgement   ADL                                         General ADL Comments: Pt currently requires PRN MIN A for LB bathing and dressing due to decreased strength and ROM of L knee. Pt reports cousin can provide any needed assist at discharge.     Vision         Perception     Praxis      Pertinent Vitals/Pain Pain Assessment Pain Assessment: No/denies pain     Hand Dominance     Extremity/Trunk Assessment Upper Extremity Assessment Upper Extremity Assessment: Overall  WFL for tasks assessed   Lower Extremity Assessment Lower Extremity Assessment: LLE deficits/detail LLE Deficits / Details: L TKA with expected post-op strength/ROM deficits   Cervical / Trunk Assessment Cervical / Trunk Assessment: Normal   Communication Communication Communication: No difficulties   Cognition Arousal/Alertness: Awake/alert Behavior During Therapy: WFL for tasks assessed/performed Overall Cognitive Status: Within Functional Limits for tasks assessed                                       General Comments       Exercises Other Exercises Other Exercises: Pt educated in home/routines modifications, compression stocking mgt, polar care mgt, falls prevention, activity pacing, positioning of L knee at rest,  and AE/DME. Pt verbalized understanding. Handout provided.   Shoulder Instructions      Home Living Family/patient expects to be discharged to:: Private residence Living Arrangements: Alone Available Help at Discharge: Family Type of Home: House Home Access: Stairs to enter Technical brewer of Steps: 3 Entrance Stairs-Rails: Left Home Layout: One level     Bathroom Shower/Tub: Occupational psychologist: Handicapped height     [place]: Conservation officer, nature (2 wheels);BSC/3in1;Grab bars - tub/shower;Grab bars - toilet          Prior Functioning/Environment Prior Level of Function : Independent/Modified Independent                        OT Problem List: Decreased strength;Decreased range of motion      OT Treatment/Interventions:      OT Goals(Current goals can be found in the care plan section) Acute Rehab OT Goals Patient Stated Goal: get better OT Goal Formulation: All assessment and education complete, DC therapy  OT Frequency:      Co-evaluation              AM-PAC OT "6 Clicks" Daily Activity     Outcome Measure Help from another person eating meals?: None Help from another person taking care of personal grooming?: None Help from another person toileting, which includes using toliet, bedpan, or urinal?: None Help from another person bathing (including washing, rinsing, drying)?: A Little Help from another person to put on and taking off regular upper body clothing?: None Help from another person to put on and taking off regular lower body clothing?: A Little 6 Click Score: 22   End of Session Nurse Communication: Mobility status  Activity Tolerance: Patient tolerated treatment well Patient left: in bed;with call bell/phone within reach  OT Visit Diagnosis: Other abnormalities of gait and mobility (R26.89)                Time: 5409-8119 OT Time Calculation (min): 15 min Charges:  OT General Charges $OT Visit: 1 Visit OT  Evaluation $OT Eval Low Complexity: 1 Low OT Treatments $Self Care/Home Management : 8-22 mins  Ardeth Perfect., MPH, MS, OTR/L ascom [phone] 12/11/21, 9:50 AM

## 2021-12-11 NOTE — Progress Notes (Signed)
Physical Therapy Treatment Patient Details Name: [name] MRN: [id number] DOB: [birth year] Today's Date: 12/11/2021   History of Present Illness Pt is a 76 y.o. female s/p L TKA 12/10/21.    PT Comments    Pt received upright in bed. Agreeable to PT services. PT provided with HEP packet with education on reps/sets/frequency with re-education on LLE positioning to prevent joint contracture. Pt performs all therex well in packet without difficulty. Some minimal VC's needed. Limited knee flexion noted due to pain and ace bandaging. Pt progression at supervision level with bed mobility, transfers, gait and stair training. Pt with consistent L heel strike in gait performing 200' and stair training successfully.  Education provided on car transfer with pt returning to bed with all needs in reach. No further questions or concerns. Pt remains safe to d/c home with family support and OPPT.    Recommendations for follow up therapy are one component of a multi-disciplinary discharge planning process, led by the attending physician.  Recommendations may be updated based on patient status, additional functional criteria and insurance authorization.  Follow Up Recommendations  Outpatient PT     Assistance Recommended at Discharge Frequent or constant Supervision/Assistance  Patient can return home with the following A little help with walking and/or transfers;A little help with bathing/dressing/bathroom;Assistance with cooking/housework;Assist for transportation;Help with stairs or ramp for entrance   Equipment Recommendations  None recommended by PT    Recommendations for Other Services       Precautions / Restrictions Precautions Precautions: Knee Precaution Booklet Issued: Yes (comment) Required Braces or Orthoses: Knee Immobilizer - Left Knee Immobilizer - Left: Other (comment) Restrictions Weight Bearing Restrictions: Yes LLE Weight Bearing: Weight bearing as tolerated      Mobility  Bed Mobility Overal bed mobility: Needs Assistance Bed Mobility: Supine to Sit, Sit to Supine     Supine to sit: Supervision, HOB elevated Sit to supine: Supervision     Patient Response: Cooperative  Transfers Overall transfer level: Needs assistance Equipment used: Rolling walker (2 wheels) Transfers: Sit to/from Stand Sit to Stand: Supervision           General transfer comment: VC's for hand placement.    Ambulation/Gait Ambulation/Gait assistance: Supervision Gait Distance (Feet): 200 Feet Assistive device: Rolling walker (2 wheels) Gait Pattern/deviations: Step-through pattern, Decreased step length - right, Decreased step length - left, Decreased stance time - left       General Gait Details: Able to perform consistent step through gait with RW and consistent heel strike with VC's.   Stairs Stairs: Yes Stairs assistance: Supervision Stair Management: One rail Left, Step to pattern, Sideways Number of Stairs: 4 General stair comments: asc/desc stairs x2 reps as described above. Education and demo provided prior to completion. Completes safely with correct safety measures and sequencing   Wheelchair Mobility    Modified Rankin (Stroke Patients Only)       Balance Overall balance assessment: Needs assistance Sitting-balance support: No upper extremity supported, Feet supported Sitting balance-Leahy Scale: Good     Standing balance support: No upper extremity supported Standing balance-Leahy Scale: Fair Standing balance comment: can maintain static standing balance                            Cognition Arousal/Alertness: Awake/alert Behavior During Therapy: WFL for tasks assessed/performed Overall Cognitive Status: Within Functional Limits for tasks assessed  Exercises Total Joint Exercises Ankle Circles/Pumps: AROM, Strengthening, Both, 10 reps, Supine Quad  Sets: AROM, Strengthening, Left, 10 reps, Supine Short Arc Quad: AROM, Strengthening, Supine, Left, 10 reps Heel Slides: AROM, Strengthening, Left, 10 reps, Supine Hip ABduction/ADduction: AROM, Supine, Strengthening, Left, 10 reps Straight Leg Raises: AROM, Strengthening, Left, 10 reps, Supine Long Arc Quad: AROM, Supine, Strengthening, Left, 10 reps Other Exercises Other Exercises: car transfer, stair training, HEP packet education    General Comments        Pertinent Vitals/Pain Pain Assessment Pain Assessment: Faces Faces Pain Scale: Hurts a little bit Pain Location: L knee with ambulation and stairs Pain Descriptors / Indicators: Discomfort, Sore Pain Intervention(s): Limited activity within patient's tolerance, Monitored during session, Repositioned, Ice applied    Home Living                          Prior Function            PT Goals (current goals can now be found in the care plan section) Acute Rehab PT Goals Patient Stated Goal: To return home PT Goal Formulation: With patient Time For Goal Achievement: 12/24/21 Potential to Achieve Goals: Good Progress towards PT goals: Progressing toward goals    Frequency    BID      PT Plan Current plan remains appropriate    Co-evaluation              AM-PAC PT "6 Clicks" Mobility   Outcome Measure  Help needed turning from your back to your side while in a flat bed without using bedrails?: None Help needed moving from lying on your back to sitting on the side of a flat bed without using bedrails?: A Little Help needed moving to and from a bed to a chair (including a wheelchair)?: A Little Help needed standing up from a chair using your arms (e.g., wheelchair or bedside chair)?: A Little Help needed to walk in hospital room?: A Little Help needed climbing 3-5 steps with a railing? : A Little 6 Click Score: 19    End of Session Equipment Utilized During Treatment: Gait belt Activity  Tolerance: Patient tolerated treatment well Patient left: in bed;with call bell/phone within reach;with bed alarm set Nurse Communication: Mobility status PT Visit Diagnosis: Unsteadiness on feet (R26.81);Other abnormalities of gait and mobility (R26.89);Muscle weakness (generalized) (M62.81)     Time: 6073-7106 PT Time Calculation (min) (ACUTE ONLY): 20 min  Charges:  $Gait Training: 8-22 mins                    Salem Caster. Fairly IV, PT, DPT Physical Therapist- [facility]  12/11/2021, 9:33 AM

## 2021-12-11 NOTE — Plan of Care (Signed)
Pt ready for discharge

## 2021-12-11 NOTE — Discharge Summary (Signed)
Physician Discharge Summary  Patient ID: [name] MRN: [id number] DOB/AGE: 06-09-1945 76 y.o.  Admit date: 12/10/2021 Discharge date: 12/11/2021  Admission Diagnoses:  Left Knee Osteoarthritis S/P TKR (total knee replacement) using cement, left  Discharge Diagnoses:  Left Knee Osteoarthritis Principal Problem:   S/P TKR (total knee replacement) using cement, left   Past Medical History:  Diagnosis Date   Coronary artery disease    a. 07/2013 STEMI/PCI ([place]): LM nl, LAD 100p (thrombectomy & 3.5x23 Alpine DES), LCX nl, RCA nl, EF 45%.   Depression    Diastolic dysfunction    a.) TTE 11/26/2020 - LVEF 50-55%; G2DD.   Endometrial cancer ([place])    Gravida 3 para 3    Hyperlipidemia    Hypertension    Hypertensive heart disease    Hypothyroid    Ischemic cardiomyopathy    a.) 07/2013 EF 45% @ time of STEMI; b.) 11/2013 Echo: EF 55-60%, mildly dil LA, nl RV fxn. c.) TTE 07/29/2015 - LVEF 55-60%, mild LA and RV dilation. d.) TTE 11/26/2020 - LVEF 50-55%; G2DD.   Menopause    Obstructive sleep apnea on CPAP    Osteoarthritis    PONV (postoperative nausea and vomiting)    ST elevation myocardial infarction (STEMI) of anterolateral wall ([place]) 08/14/2013   a.) LVEF reduced at 45%; Tx'd with trombectomy and PCI to 100% LAD; 3.5 x 23 mm Xience Alpine DES placed.   T2DM (type 2 diabetes mellitus) ([place])    Thyroid cancer ([place])    a.) s/p total thyroidectomy. b.) on daily levothyroxine.    Surgeries: Procedure(s): TOTAL KNEE ARTHROPLASTY on 12/10/2021   Consultants (if any):   Discharged Condition: Improved  Hospital Course: [name] is an 76 y.o. female who was admitted 12/10/2021 with a diagnosis of  Left Knee Osteoarthritis S/P TKR (total knee replacement) using cement, left and went to the operating room on 12/10/2021 and underwent an uncomplicated left total knee arthroplasty.    She was given perioperative antibiotics:  Anti-infectives (From admission, onward)     Start     Dose/Rate Route Frequency Ordered Stop   12/10/21 1400  ceFAZolin (ANCEF) IVPB 2g/100 mL premix        2 g 200 mL/hr over 30 Minutes Intravenous Every 6 hours 12/10/21 1205 12/10/21 2043   12/10/21 0618  ceFAZolin (ANCEF) 2-4 GM/100ML-% IVPB       Note to Pharmacy: Lorenza Cambridge J: cabinet override      12/10/21 0618 12/10/21 1436   12/10/21 0615  ceFAZolin (ANCEF) IVPB 2g/100 mL premix        2 g 200 mL/hr over 30 Minutes Intravenous On call to O.R. 12/10/21 1448 12/10/21 0836     .  She was given sequential compression devices, early ambulation, and lovenox for DVT prophylaxis.  She benefited maximally from the hospital stay and there were no complications.    Recent vital signs:  Vitals:   12/11/21 0615 12/11/21 1230  BP: (!) 132/52 126/60  Pulse: 67 (!) 56  Resp: 16   Temp: 97.8 F (36.6 C)   SpO2: 97% 98%    Recent laboratory studies:  Lab Results  Component Value Date   HGB 9.4 (L) 12/11/2021   HGB 11.7 (L) 11/30/2021   HGB 10.1 (L) 12/19/2020   Lab Results  Component Value Date   WBC 9.9 12/11/2021   PLT 178 12/11/2021   Lab Results  Component Value Date   INR 1.1 11/30/2021   Lab Results  Component Value Date   NA 136 12/11/2021   K 4.1 12/11/2021   CL 108 12/11/2021   CO2 21 (L) 12/11/2021   BUN 19 12/11/2021   CREATININE 0.93 12/11/2021   GLUCOSE 229 (H) 12/11/2021    Discharge Medications:   Allergies as of 12/11/2021   No Known Allergies      Medication List     TAKE these medications    Accu-Chek Aviva Plus test strip Generic drug: glucose blood U UTD BID   acetaminophen 650 MG CR tablet Commonly known as: TYLENOL Take 1,950 mg by mouth daily.   amLODipine 2.5 MG tablet Commonly known as: NORVASC TAKE 1 TABLET BY MOUTH DAILY   aspirin EC 325 MG tablet Take 1 tablet (325 mg total) by mouth 2 (two) times daily. What changed:  medication strength how much to take when to take this additional instructions    bisacodyl 5 MG EC tablet Commonly known as: DULCOLAX Take 2 tablets (10 mg total) by mouth daily as needed for moderate constipation.   carvedilol 3.125 MG tablet Commonly known as: COREG TAKE 1 TABLET BY MOUTH TWICE  DAILY What changed:  how much to take how to take this when to take this additional instructions   docusate sodium 100 MG capsule Commonly known as: COLACE Take 1 capsule (100 mg total) by mouth 2 (two) times daily.   ezetimibe 10 MG tablet Commonly known as: ZETIA TAKE 1 TABLET BY MOUTH DAILY   fexofenadine 180 MG tablet Commonly known as: ALLEGRA Take 180 mg by mouth daily as needed for allergies or rhinitis.   FLUoxetine 10 MG capsule Commonly known as: PROZAC TAKE 1 CAPSULE BY MOUTH DAILY   fluticasone 50 MCG/ACT nasal spray Commonly known as: FLONASE Place 2 sprays into both nostrils daily. What changed:  how much to take when to take this reasons to take this   levothyroxine 125 MCG tablet Commonly known as: SYNTHROID TAKE 1 TABLET BY MOUTH  DAILY BEFORE BREAKFAST   losartan 100 MG tablet Commonly known as: COZAAR TAKE 1 TABLET BY MOUTH DAILY   metFORMIN 500 MG 24 hr tablet Commonly known as: GLUCOPHAGE-XR TAKE 3 TABLETS BY MOUTH  DAILY WITH BREAKFAST   nitroGLYCERIN 0.4 MG SL tablet Commonly known as: NITROSTAT Place 1 tablet (0.4 mg total) under the tongue every 5 (five) minutes as needed for chest pain.   oxyCODONE 5 MG immediate release tablet Commonly known as: Oxy IR/ROXICODONE Take 1-2 tablets (5-10 mg total) by mouth every 4 (four) hours as needed for moderate pain (pain score 4-6).   Ozempic (1 MG/DOSE) 4 MG/3ML Sopn Generic drug: Semaglutide (1 MG/DOSE) INJECT SUBCUTANEOUSLY 1 MG EVERY WEEK What changed: See the new instructions.   rosuvastatin 20 MG tablet Commonly known as: CRESTOR TAKE 1 TABLET BY MOUTH DAILY        Diagnostic Studies: DG Knee Left Port  Result Date: 12/10/2021 CLINICAL DATA:  Status post left  total knee arthroplasty. EXAM: PORTABLE LEFT KNEE - 1-2 VIEW COMPARISON:  None Available. FINDINGS: Status post left total knee arthroplasty with well-positioned intact hardware. Expected gas within and anterior to the left knee joint. No dislocation. No focal osseous lesions. Midline anterior left knee vertical skin staples. IMPRESSION: Satisfactory immediate postoperative appearance status post left total knee arthroplasty. Electronically Signed   By: Ilona Sorrel M.D.   On: 12/10/2021 12:08    Disposition: Discharge disposition: 01-Home or Self Care       Discharge Instructions  Call MD / Call 911   Complete by: As directed    If you experience chest pain or shortness of breath, CALL 911 and be transported to the hospital emergency room.  If you develope a fever above 101 F, pus (white drainage) or increased drainage or redness at the wound, or calf pain, call your surgeon's office.   Constipation Prevention   Complete by: As directed    Drink plenty of fluids.  Prune juice may be helpful.  You may use a stool softener, such as Colace (over the counter) 100 mg twice a day.  Use MiraLax (over the counter) for constipation as needed.   Diet general   Complete by: As directed    Discharge instructions   Complete by: As directed    Follow up with Dr[facility] Mack Guise at [facility] on 12-23-2021 @ 11:30 AM.  The patient may continue to bear weight on the left lower extremity with use of a walker. The patient should continue to use TED stockings until follow-up. Patient should remove the TED stockings at night for sleep. The patient needs to continue to elevate the left lower extremity whenever possible. The knee immobilizer should be used at night. The patient may remove the knee immobilizer to perform exercises or sit in a chair during the day.  Patient should not place a pillow under their knee. The Polar Care may be used by the patient for comfort.  The dressing should remain on until follow up  in the office.   The patient must cover the left knee dressing/incision during showers with a plastic bag or Saran wrap.  The patient will take aspirin 325 mg twice a day for blood clot prevention and continue to work on knee range of motion exercises at home as instructed by physical therapy until follow-up in the office.   Driving restrictions   Complete by: As directed    No driving until follow up with Dr. Mack Guise in the office.   Increase activity slowly as tolerated   Complete by: As directed    Lifting restrictions   Complete by: As directed    No lifting for 12 weeks   Post-operative opioid taper instructions:   Complete by: As directed    POST-OPERATIVE OPIOID TAPER INSTRUCTIONS: It is important to wean off of your opioid medication as soon as possible. If you do not need pain medication after your surgery it is ok to stop day one. Opioids include: Codeine, Hydrocodone(Norco, Vicodin), Oxycodone(Percocet, oxycontin) and hydromorphone amongst others.  Long term and even short term use of opiods can cause: Increased pain response Dependence Constipation Depression Respiratory depression And more.  Withdrawal symptoms can include Flu like symptoms Nausea, vomiting And more Techniques to manage these symptoms Hydrate well Eat regular healthy meals Stay active Use relaxation techniques(deep breathing, meditating, yoga) Do Not substitute Alcohol to help with tapering If you have been on opioids for less than two weeks and do not have pain than it is ok to stop all together.  Plan to wean off of opioids This plan should start within one week post op of your joint replacement. Maintain the same interval or time between taking each dose and first decrease the dose.  Cut the total daily intake of opioids by one tablet each day Next start to increase the time between doses. The last dose that should be eliminated is the evening dose.             Signed: Thornton Park ,MD  12/11/2021, 1:52 PM

## 2021-12-11 NOTE — Progress Notes (Signed)
  Subjective:  POD #1 s/p left TKA.   Patient reports left knee pain as mild.    Objective:   VITALS:   Vitals:   12/10/21 1552 12/10/21 2019 12/11/21 0615 12/11/21 1230  BP: 122/63 (!) 142/59 (!) 132/52 126/60  Pulse: 78 86 67 (!) 56  Resp:  16 16   Temp: 97.9 F (36.6 C) (!) 97.4 F (36.3 C) 97.8 F (36.6 C)   TempSrc: Oral     SpO2: 97% 94% 97% 98%  Weight:      Height:        PHYSICAL EXAM: Left lower extremity: Neurovascular intact Sensation intact distally Intact pulses distally Dorsiflexion/Plantar flexion intact Incision: dressing C/D/I No cellulitis present Compartment soft  LABS  Results for orders placed or performed during the hospital encounter of 12/10/21 (from the past 24 hour(s))  CBC     Status: Abnormal   Collection Time: 12/11/21  6:47 AM  Result Value Ref Range   WBC 9.9 4.0 - 10.5 K/uL   RBC 3.09 (L) 3.87 - 5.11 MIL/uL   Hemoglobin 9.4 (L) 12.0 - 15.0 g/dL   HCT 29.4 (L) 36.0 - 46.0 %   MCV 95.1 80.0 - 100.0 fL   MCH 30.4 26.0 - 34.0 pg   MCHC 32.0 30.0 - 36.0 g/dL   RDW 11.5 11.5 - 15.5 %   Platelets 178 150 - 400 K/uL   nRBC 0.0 0.0 - 0.2 %  Basic metabolic panel     Status: Abnormal   Collection Time: 12/11/21  6:47 AM  Result Value Ref Range   Sodium 136 135 - 145 mmol/L   Potassium 4.1 3.5 - 5.1 mmol/L   Chloride 108 98 - 111 mmol/L   CO2 21 (L) 22 - 32 mmol/L   Glucose, Bld 229 (H) 70 - 99 mg/dL   BUN 19 8 - 23 mg/dL   Creatinine, Ser 0.93 0.44 - 1.00 mg/dL   Calcium 8.1 (L) 8.9 - 10.3 mg/dL   GFR, Estimated >60 >60 mL/min   Anion gap 7 5 - 15    DG Knee Left Port  Result Date: 12/10/2021 CLINICAL DATA:  Status post left total knee arthroplasty. EXAM: PORTABLE LEFT KNEE - 1-2 VIEW COMPARISON:  None Available. FINDINGS: Status post left total knee arthroplasty with well-positioned intact hardware. Expected gas within and anterior to the left knee joint. No dislocation. No focal osseous lesions. Midline anterior left knee  vertical skin staples. IMPRESSION: Satisfactory immediate postoperative appearance status post left total knee arthroplasty. Electronically Signed   By: Ilona Sorrel M.D.   On: 12/10/2021 12:08    Assessment/Plan: 1 Day Post-Op   Principal Problem:   S/P TKR (total knee replacement) using cement, left  Patient is doing well postop.  She has been cleared for discharge by physical therapy.  Patient be discharged home today and will get outpatient therapy Monday.  Patient will take enteric-coated aspirin 325 mg p.o. twice daily for DVT prophylaxis.  Patient will follow-up with me in the office on 12-23-2021 @ 11:30 AM.    Thornton Park , MD 12/11/2021, 1:32 PM

## 2021-12-14 DIAGNOSIS — M25562 Pain in left knee: Secondary | ICD-10-CM | POA: Diagnosis not present

## 2021-12-14 DIAGNOSIS — Z96652 Presence of left artificial knee joint: Secondary | ICD-10-CM | POA: Diagnosis not present

## 2021-12-14 DIAGNOSIS — M25662 Stiffness of left knee, not elsewhere classified: Secondary | ICD-10-CM | POA: Diagnosis not present

## 2021-12-14 DIAGNOSIS — R262 Difficulty in walking, not elsewhere classified: Secondary | ICD-10-CM | POA: Diagnosis not present

## 2021-12-14 DIAGNOSIS — Z4789 Encounter for other orthopedic aftercare: Secondary | ICD-10-CM | POA: Diagnosis not present

## 2021-12-16 DIAGNOSIS — Z4789 Encounter for other orthopedic aftercare: Secondary | ICD-10-CM | POA: Diagnosis not present

## 2021-12-16 DIAGNOSIS — Z96652 Presence of left artificial knee joint: Secondary | ICD-10-CM | POA: Diagnosis not present

## 2021-12-16 DIAGNOSIS — M25662 Stiffness of left knee, not elsewhere classified: Secondary | ICD-10-CM | POA: Diagnosis not present

## 2021-12-16 DIAGNOSIS — M25562 Pain in left knee: Secondary | ICD-10-CM | POA: Diagnosis not present

## 2021-12-16 DIAGNOSIS — R262 Difficulty in walking, not elsewhere classified: Secondary | ICD-10-CM | POA: Diagnosis not present

## 2021-12-21 DIAGNOSIS — M25662 Stiffness of left knee, not elsewhere classified: Secondary | ICD-10-CM | POA: Diagnosis not present

## 2021-12-21 DIAGNOSIS — R262 Difficulty in walking, not elsewhere classified: Secondary | ICD-10-CM | POA: Diagnosis not present

## 2021-12-21 DIAGNOSIS — M25562 Pain in left knee: Secondary | ICD-10-CM | POA: Diagnosis not present

## 2021-12-21 DIAGNOSIS — Z96652 Presence of left artificial knee joint: Secondary | ICD-10-CM | POA: Diagnosis not present

## 2021-12-21 DIAGNOSIS — Z4789 Encounter for other orthopedic aftercare: Secondary | ICD-10-CM | POA: Diagnosis not present

## 2021-12-23 DIAGNOSIS — Z96652 Presence of left artificial knee joint: Secondary | ICD-10-CM | POA: Diagnosis not present

## 2021-12-23 DIAGNOSIS — Z4789 Encounter for other orthopedic aftercare: Secondary | ICD-10-CM | POA: Diagnosis not present

## 2021-12-23 DIAGNOSIS — M25662 Stiffness of left knee, not elsewhere classified: Secondary | ICD-10-CM | POA: Diagnosis not present

## 2021-12-23 DIAGNOSIS — R262 Difficulty in walking, not elsewhere classified: Secondary | ICD-10-CM | POA: Diagnosis not present

## 2021-12-23 DIAGNOSIS — M25562 Pain in left knee: Secondary | ICD-10-CM | POA: Diagnosis not present

## 2021-12-29 DIAGNOSIS — M25662 Stiffness of left knee, not elsewhere classified: Secondary | ICD-10-CM | POA: Diagnosis not present

## 2021-12-29 DIAGNOSIS — R262 Difficulty in walking, not elsewhere classified: Secondary | ICD-10-CM | POA: Diagnosis not present

## 2021-12-29 DIAGNOSIS — M25562 Pain in left knee: Secondary | ICD-10-CM | POA: Diagnosis not present

## 2021-12-29 DIAGNOSIS — Z4789 Encounter for other orthopedic aftercare: Secondary | ICD-10-CM | POA: Diagnosis not present

## 2021-12-29 DIAGNOSIS — Z96652 Presence of left artificial knee joint: Secondary | ICD-10-CM | POA: Diagnosis not present

## 2022-01-01 DIAGNOSIS — M25662 Stiffness of left knee, not elsewhere classified: Secondary | ICD-10-CM | POA: Diagnosis not present

## 2022-01-01 DIAGNOSIS — R262 Difficulty in walking, not elsewhere classified: Secondary | ICD-10-CM | POA: Diagnosis not present

## 2022-01-01 DIAGNOSIS — M25562 Pain in left knee: Secondary | ICD-10-CM | POA: Diagnosis not present

## 2022-01-01 DIAGNOSIS — Z4789 Encounter for other orthopedic aftercare: Secondary | ICD-10-CM | POA: Diagnosis not present

## 2022-01-01 DIAGNOSIS — Z96652 Presence of left artificial knee joint: Secondary | ICD-10-CM | POA: Diagnosis not present

## 2022-01-04 DIAGNOSIS — Z4789 Encounter for other orthopedic aftercare: Secondary | ICD-10-CM | POA: Diagnosis not present

## 2022-01-04 DIAGNOSIS — R262 Difficulty in walking, not elsewhere classified: Secondary | ICD-10-CM | POA: Diagnosis not present

## 2022-01-04 DIAGNOSIS — M25662 Stiffness of left knee, not elsewhere classified: Secondary | ICD-10-CM | POA: Diagnosis not present

## 2022-01-04 DIAGNOSIS — M25562 Pain in left knee: Secondary | ICD-10-CM | POA: Diagnosis not present

## 2022-01-04 DIAGNOSIS — Z96652 Presence of left artificial knee joint: Secondary | ICD-10-CM | POA: Diagnosis not present

## 2022-01-06 DIAGNOSIS — R262 Difficulty in walking, not elsewhere classified: Secondary | ICD-10-CM | POA: Diagnosis not present

## 2022-01-06 DIAGNOSIS — M25662 Stiffness of left knee, not elsewhere classified: Secondary | ICD-10-CM | POA: Diagnosis not present

## 2022-01-06 DIAGNOSIS — Z96652 Presence of left artificial knee joint: Secondary | ICD-10-CM | POA: Diagnosis not present

## 2022-01-06 DIAGNOSIS — Z4789 Encounter for other orthopedic aftercare: Secondary | ICD-10-CM | POA: Diagnosis not present

## 2022-01-06 DIAGNOSIS — M25562 Pain in left knee: Secondary | ICD-10-CM | POA: Diagnosis not present

## 2022-01-07 ENCOUNTER — Other Ambulatory Visit: Payer: Self-pay | Admitting: Family Medicine

## 2022-01-07 DIAGNOSIS — E1121 Type 2 diabetes mellitus with diabetic nephropathy: Secondary | ICD-10-CM

## 2022-01-08 NOTE — Telephone Encounter (Signed)
Requested Prescriptions  Pending Prescriptions Disp Refills   metFORMIN (GLUCOPHAGE-XR) 500 MG 24 hr tablet [Pharmacy Med Name: metFORMIN HCl ER 500 MG Oral Tablet Extended Release 24 Hour] 270 tablet 0    Sig: TAKE 3 TABLETS BY MOUTH DAILY  WITH BREAKFAST     Endocrinology:  Diabetes - Biguanides Failed - 01/07/2022 10:17 PM      Failed - B12 Level in normal range and within 720 days    No results found for: "VITAMINB12"       Failed - Valid encounter within last 6 months    Recent Outpatient Visits           7 months ago Controlled type 2 diabetes mellitus with diabetic nephropathy, without long-term current use of insulin ([place])   [place], DO   1 year ago Controlled type 2 diabetes mellitus with diabetic nephropathy, without long-term current use of insulin ([place])   [place], Devonne Doughty, DO   2 years ago Annual physical exam   [facility] Olin Hauser, DO   2 years ago Controlled type 2 diabetes mellitus with diabetic nephropathy, without long-term current use of insulin ([place])   [place], Devonne Doughty, DO   3 years ago Annual physical exam   [place], DO              Passed - Cr in normal range and within 360 days    Creat  Date Value Ref Range Status  06/24/2020 0.96 (H) 0.60 - 0.93 mg/dL Final    Comment:    For patients >36 years of age, the reference limit for Creatinine is approximately 13% higher for people identified as African-American. .    Creatinine, Ser  Date Value Ref Range Status  12/11/2021 0.93 0.44 - 1.00 mg/dL Final         Passed - HBA1C is between 0 and 7.9 and within 180 days    Hemoglobin A1C  Date Value Ref Range Status  08/10/2018 7.7  Final   Hgb A1c MFr Bld  Date Value Ref Range Status  10/01/2021 6.6 (H) <5.7 % of total Hgb Final    Comment:    For someone  without known diabetes, a hemoglobin A1c value of 6.5% or greater indicates that they may have  diabetes and this should be confirmed with a follow-up  test. . For someone with known diabetes, a value <7% indicates  that their diabetes is well controlled and a value  greater than or equal to 7% indicates suboptimal  control. A1c targets should be individualized based on  duration of diabetes, age, comorbid conditions, and  other considerations. . Currently, no consensus exists regarding use of hemoglobin A1c for diagnosis of diabetes for children. .          Passed - eGFR in normal range and within 360 days    GFR, Est African American  Date Value Ref Range Status  06/24/2020 67 > OR = 60 mL/min/1.23m Final   GFR, Est Non African American  Date Value Ref Range Status  06/24/2020 58 (L) > OR = 60 mL/min/1.744mFinal   GFR, Estimated  Date Value Ref Range Status  12/11/2021 >60 >60 mL/min Final    Comment:    (NOTE) Calculated using the CKD-EPI Creatinine Equation (2021)          Passed - CBC within normal limits and  completed in the last 12 months    WBC  Date Value Ref Range Status  12/11/2021 9.9 4.0 - 10.5 K/uL Final   RBC  Date Value Ref Range Status  12/11/2021 3.09 (L) 3.87 - 5.11 MIL/uL Final   Hemoglobin  Date Value Ref Range Status  12/11/2021 9.4 (L) 12.0 - 15.0 g/dL Final   HGB  Date Value Ref Range Status  02/14/2013 11.6 (L) 12.0 - 16.0 g/dL Final   HCT  Date Value Ref Range Status  12/11/2021 29.4 (L) 36.0 - 46.0 % Final  02/06/2013 35.9 35.0 - 47.0 % Final   MCHC  Date Value Ref Range Status  12/11/2021 32.0 30.0 - 36.0 g/dL Final   [facility]  Date Value Ref Range Status  12/11/2021 30.4 26.0 - 34.0 pg Final   MCV  Date Value Ref Range Status  12/11/2021 95.1 80.0 - 100.0 fL Final  02/06/2013 91 80 - 100 fL Final   No results found for: "PLTCOUNTKUC", "LABPLAT", "POCPLA" RDW  Date Value Ref Range Status  12/11/2021 11.5 11.5 - 15.5 %  Final  02/06/2013 12.7 11.5 - 14.5 % Final

## 2022-01-11 DIAGNOSIS — Z4789 Encounter for other orthopedic aftercare: Secondary | ICD-10-CM | POA: Diagnosis not present

## 2022-01-11 DIAGNOSIS — M25562 Pain in left knee: Secondary | ICD-10-CM | POA: Diagnosis not present

## 2022-01-11 DIAGNOSIS — Z96652 Presence of left artificial knee joint: Secondary | ICD-10-CM | POA: Diagnosis not present

## 2022-01-11 DIAGNOSIS — M25662 Stiffness of left knee, not elsewhere classified: Secondary | ICD-10-CM | POA: Diagnosis not present

## 2022-01-11 DIAGNOSIS — R262 Difficulty in walking, not elsewhere classified: Secondary | ICD-10-CM | POA: Diagnosis not present

## 2022-01-13 DIAGNOSIS — Z4789 Encounter for other orthopedic aftercare: Secondary | ICD-10-CM | POA: Diagnosis not present

## 2022-01-13 DIAGNOSIS — M25562 Pain in left knee: Secondary | ICD-10-CM | POA: Diagnosis not present

## 2022-01-13 DIAGNOSIS — Z96652 Presence of left artificial knee joint: Secondary | ICD-10-CM | POA: Diagnosis not present

## 2022-01-13 DIAGNOSIS — M25662 Stiffness of left knee, not elsewhere classified: Secondary | ICD-10-CM | POA: Diagnosis not present

## 2022-01-13 DIAGNOSIS — R262 Difficulty in walking, not elsewhere classified: Secondary | ICD-10-CM | POA: Diagnosis not present

## 2022-01-18 ENCOUNTER — Other Ambulatory Visit: Payer: Self-pay | Admitting: Family Medicine

## 2022-01-18 DIAGNOSIS — E1121 Type 2 diabetes mellitus with diabetic nephropathy: Secondary | ICD-10-CM

## 2022-01-18 DIAGNOSIS — R262 Difficulty in walking, not elsewhere classified: Secondary | ICD-10-CM | POA: Diagnosis not present

## 2022-01-18 DIAGNOSIS — M25562 Pain in left knee: Secondary | ICD-10-CM | POA: Diagnosis not present

## 2022-01-18 DIAGNOSIS — Z96652 Presence of left artificial knee joint: Secondary | ICD-10-CM | POA: Diagnosis not present

## 2022-01-18 DIAGNOSIS — M25662 Stiffness of left knee, not elsewhere classified: Secondary | ICD-10-CM | POA: Diagnosis not present

## 2022-01-18 DIAGNOSIS — Z4789 Encounter for other orthopedic aftercare: Secondary | ICD-10-CM | POA: Diagnosis not present

## 2022-01-18 NOTE — Telephone Encounter (Signed)
Attempted to call patient to schedule appointment- left message to call office- courtesy RF given Requested Prescriptions  Pending Prescriptions Disp Refills   OZEMPIC, 1 MG/DOSE, 4 MG/3ML SOPN [Pharmacy Med Name: Ozempic (1 MG/DOSE) 4 MG/3ML Subcutaneous Solution Pen-injector] 3 mL 0    Sig: INJECT SUBCUTANEOUSLY 1 MG  WEEKLY     Endocrinology:  Diabetes - GLP-1 Receptor Agonists - semaglutide Failed - 01/18/2022  5:48 AM      Failed - HBA1C in normal range and within 180 days    Hemoglobin A1C  Date Value Ref Range Status  08/10/2018 7.7  Final   Hgb A1c MFr Bld  Date Value Ref Range Status  10/01/2021 6.6 (H) <5.7 % of total Hgb Final    Comment:    For someone without known diabetes, a hemoglobin A1c value of 6.5% or greater indicates that they may have  diabetes and this should be confirmed with a follow-up  test. . For someone with known diabetes, a value <7% indicates  that their diabetes is well controlled and a value  greater than or equal to 7% indicates suboptimal  control. A1c targets should be individualized based on  duration of diabetes, age, comorbid conditions, and  other considerations. . Currently, no consensus exists regarding use of hemoglobin A1c for diagnosis of diabetes for children. .          Failed - Valid encounter within last 6 months    Recent Outpatient Visits           7 months ago Controlled type 2 diabetes mellitus with diabetic nephropathy, without long-term current use of insulin ([place])   [place], DO   1 year ago Controlled type 2 diabetes mellitus with diabetic nephropathy, without long-term current use of insulin ([place])   [place], Devonne Doughty, DO   2 years ago Annual physical exam   [facility] Olin Hauser, DO   2 years ago Controlled type 2 diabetes mellitus with diabetic nephropathy, without long-term current use of insulin ([place])    [facility], Devonne Doughty, DO   3 years ago Annual physical exam   [place], DO              Passed - Cr in normal range and within 360 days    Creat  Date Value Ref Range Status  06/24/2020 0.96 (H) 0.60 - 0.93 mg/dL Final    Comment:    For patients >56 years of age, the reference limit for Creatinine is approximately 13% higher for people identified as African-American. .    Creatinine, Ser  Date Value Ref Range Status  12/11/2021 0.93 0.44 - 1.00 mg/dL Final

## 2022-01-20 DIAGNOSIS — M25662 Stiffness of left knee, not elsewhere classified: Secondary | ICD-10-CM | POA: Diagnosis not present

## 2022-01-20 DIAGNOSIS — Z96652 Presence of left artificial knee joint: Secondary | ICD-10-CM | POA: Diagnosis not present

## 2022-01-20 DIAGNOSIS — R262 Difficulty in walking, not elsewhere classified: Secondary | ICD-10-CM | POA: Diagnosis not present

## 2022-01-20 DIAGNOSIS — M25562 Pain in left knee: Secondary | ICD-10-CM | POA: Diagnosis not present

## 2022-01-20 DIAGNOSIS — Z4789 Encounter for other orthopedic aftercare: Secondary | ICD-10-CM | POA: Diagnosis not present

## 2022-01-21 DIAGNOSIS — R109 Unspecified abdominal pain: Secondary | ICD-10-CM | POA: Diagnosis not present

## 2022-01-21 DIAGNOSIS — R079 Chest pain, unspecified: Secondary | ICD-10-CM | POA: Diagnosis not present

## 2022-01-21 DIAGNOSIS — G4733 Obstructive sleep apnea (adult) (pediatric): Secondary | ICD-10-CM | POA: Diagnosis not present

## 2022-01-21 DIAGNOSIS — I160 Hypertensive urgency: Secondary | ICD-10-CM | POA: Diagnosis not present

## 2022-01-21 DIAGNOSIS — E039 Hypothyroidism, unspecified: Secondary | ICD-10-CM | POA: Diagnosis not present

## 2022-01-21 DIAGNOSIS — E119 Type 2 diabetes mellitus without complications: Secondary | ICD-10-CM | POA: Diagnosis not present

## 2022-01-21 DIAGNOSIS — Z7982 Long term (current) use of aspirin: Secondary | ICD-10-CM | POA: Diagnosis not present

## 2022-01-21 DIAGNOSIS — I2510 Atherosclerotic heart disease of native coronary artery without angina pectoris: Secondary | ICD-10-CM | POA: Diagnosis not present

## 2022-01-21 DIAGNOSIS — R112 Nausea with vomiting, unspecified: Secondary | ICD-10-CM | POA: Diagnosis not present

## 2022-01-21 DIAGNOSIS — Z955 Presence of coronary angioplasty implant and graft: Secondary | ICD-10-CM | POA: Diagnosis not present

## 2022-01-21 DIAGNOSIS — Z7984 Long term (current) use of oral hypoglycemic drugs: Secondary | ICD-10-CM | POA: Diagnosis not present

## 2022-01-21 DIAGNOSIS — R110 Nausea: Secondary | ICD-10-CM | POA: Diagnosis not present

## 2022-01-21 DIAGNOSIS — Z79899 Other long term (current) drug therapy: Secondary | ICD-10-CM | POA: Diagnosis not present

## 2022-01-21 DIAGNOSIS — Z96659 Presence of unspecified artificial knee joint: Secondary | ICD-10-CM | POA: Diagnosis not present

## 2022-01-21 DIAGNOSIS — I10 Essential (primary) hypertension: Secondary | ICD-10-CM | POA: Diagnosis not present

## 2022-01-21 DIAGNOSIS — I252 Old myocardial infarction: Secondary | ICD-10-CM | POA: Diagnosis not present

## 2022-01-21 DIAGNOSIS — E785 Hyperlipidemia, unspecified: Secondary | ICD-10-CM | POA: Diagnosis not present

## 2022-01-21 DIAGNOSIS — R0789 Other chest pain: Secondary | ICD-10-CM | POA: Diagnosis not present

## 2022-01-22 DIAGNOSIS — I160 Hypertensive urgency: Secondary | ICD-10-CM | POA: Diagnosis not present

## 2022-01-22 DIAGNOSIS — E785 Hyperlipidemia, unspecified: Secondary | ICD-10-CM | POA: Diagnosis not present

## 2022-01-22 DIAGNOSIS — I21A1 Myocardial infarction type 2: Secondary | ICD-10-CM | POA: Diagnosis not present

## 2022-01-22 DIAGNOSIS — R079 Chest pain, unspecified: Secondary | ICD-10-CM | POA: Diagnosis not present

## 2022-01-22 DIAGNOSIS — E119 Type 2 diabetes mellitus without complications: Secondary | ICD-10-CM | POA: Diagnosis not present

## 2022-01-22 DIAGNOSIS — R112 Nausea with vomiting, unspecified: Secondary | ICD-10-CM | POA: Diagnosis not present

## 2022-01-23 DIAGNOSIS — I10 Essential (primary) hypertension: Secondary | ICD-10-CM | POA: Diagnosis not present

## 2022-01-23 DIAGNOSIS — I2510 Atherosclerotic heart disease of native coronary artery without angina pectoris: Secondary | ICD-10-CM | POA: Diagnosis not present

## 2022-01-23 DIAGNOSIS — R079 Chest pain, unspecified: Secondary | ICD-10-CM | POA: Diagnosis not present

## 2022-01-23 DIAGNOSIS — R110 Nausea: Secondary | ICD-10-CM | POA: Diagnosis not present

## 2022-01-23 DIAGNOSIS — I160 Hypertensive urgency: Secondary | ICD-10-CM | POA: Diagnosis not present

## 2022-01-25 ENCOUNTER — Encounter: Payer: Self-pay | Admitting: Family Medicine

## 2022-01-25 DIAGNOSIS — R079 Chest pain, unspecified: Secondary | ICD-10-CM | POA: Diagnosis not present

## 2022-01-26 ENCOUNTER — Ambulatory Visit: Payer: Medicare Other | Attending: Cardiovascular Disease | Admitting: Cardiovascular Disease

## 2022-01-26 ENCOUNTER — Encounter: Payer: Self-pay | Admitting: Cardiovascular Disease

## 2022-01-26 VITALS — BP 80/48 | HR 74 | Temp 98.2°F | Ht 66.0 in | Wt 223.5 lb

## 2022-01-26 DIAGNOSIS — I25118 Atherosclerotic heart disease of native coronary artery with other forms of angina pectoris: Secondary | ICD-10-CM | POA: Diagnosis not present

## 2022-01-26 DIAGNOSIS — I10 Essential (primary) hypertension: Secondary | ICD-10-CM | POA: Diagnosis not present

## 2022-01-26 DIAGNOSIS — E1159 Type 2 diabetes mellitus with other circulatory complications: Secondary | ICD-10-CM | POA: Diagnosis not present

## 2022-01-26 DIAGNOSIS — Z6841 Body Mass Index (BMI) 40.0 and over, adult: Secondary | ICD-10-CM

## 2022-01-26 DIAGNOSIS — I255 Ischemic cardiomyopathy: Secondary | ICD-10-CM

## 2022-01-26 DIAGNOSIS — Z794 Long term (current) use of insulin: Secondary | ICD-10-CM | POA: Diagnosis not present

## 2022-01-26 DIAGNOSIS — N1831 Chronic kidney disease, stage 3a: Secondary | ICD-10-CM | POA: Diagnosis not present

## 2022-01-26 DIAGNOSIS — E785 Hyperlipidemia, unspecified: Secondary | ICD-10-CM | POA: Diagnosis not present

## 2022-01-26 DIAGNOSIS — E1169 Type 2 diabetes mellitus with other specified complication: Secondary | ICD-10-CM | POA: Diagnosis not present

## 2022-01-26 NOTE — Patient Instructions (Addendum)
Medication Instructions:  Hold amlodipine, hold losartan for now  Only take coreg for pressure >110 on top number  If you need a refill on your cardiac medications before your next appointment, please call your pharmacy.   Lab work: No new labs needed  Testing/Procedures: No new testing needed  Follow-Up: At [facility], you and your health needs are our priority.  As part of our continuing mission to provide you with exceptional heart care, we have created designated Provider Care Teams.  These Care Teams include your primary Cardiologist (physician) and Advanced Practice Providers (APPs -  Physician Assistants and Nurse Practitioners) who all work together to provide you with the care you need, when you need it.  You will need a follow up appointment in 6 months  Providers on your designated Care Team:   Murray Hodgkins, NP Christell Faith, PA-C Cadence Kathlen Mody, [place]  COVID-19 Vaccine Information can be found at: [email or web address] For questions related to vaccine distribution or appointments, please email [email or web address] or call [phone].

## 2022-01-26 NOTE — Progress Notes (Signed)
Cardiology Office Note  Date:  01/26/2022   ID:  [name], [name] 08-Feb-1946, MRN [id number]  PCP:  Grace Hauser, DO   Chief Complaint  Patient presents with   Follow up [facility]; chest pain    Patient c/o a fever since Friday; last night patient reports a temp of 100.8, hoarseness and some congestion, shortness of breath with little to no exertion and fatigue. Medications reviewed by the patient verbally.     HPI:  [name] is a pleasant 76 year old woman with past medical history of CAD June 2015, anterior ST elevation MI in [name]  thrombectomy and drug-eluting stent placement to the LAD.  EF 45%  echocardiogram in October 2015 showed normalization of LV function, at 55-60%.  hypertension, hyperlipidemia,  diabetes morbid obesity son passed in December 2016. She presents for follow-up of her coronary artery disease  Last seen in clinic March 2023 Since thanksgiving feeling poorly,  since 01/23/22 Spiking fever , malaise, hives Seen in [facility] ER 01/22/22 Did not check for covid, they focused on nontrending troponin averaging 70-80 Fever at home 103.8, taking Tylenol around-the-clock, sometimes NSAIDs  Blood pressure very low in the office today ,  80 systolic with nurses Denies orthostasis symptoms but feels weak  She remains on low-dose amlodipine, losartan 100, Coreg She has COVID test at home but has not done this yet  Husband with dementia, significant stress Died last year  Normal sinus rhythm rate 77 bpm no significant ST-T wave changes  Other lab work reviewed  total cholesterol 115 LDL 43   PMH:   has a past medical history of Coronary artery disease, Depression, Diastolic dysfunction, Endometrial cancer ([name]), Gravida 3 para 3, Hyperlipidemia, Hypertension, Hypertensive heart disease, Hypothyroid, Ischemic cardiomyopathy, Menopause, Obstructive sleep apnea on CPAP, Osteoarthritis, PONV (postoperative nausea and vomiting), ST  elevation myocardial infarction (STEMI) of anterolateral wall ([name]) (08/14/2013), T2DM (type 2 diabetes mellitus) ([name]), and Thyroid cancer ([name]).  PSH:    Past Surgical History:  Procedure Laterality Date   CATARACT EXTRACTION W/PHACO Left 08/30/2016   Procedure: CATARACT EXTRACTION PHACO AND INTRAOCULAR LENS PLACEMENT ([name]) Left daibetic;  Surgeon: Grace Koyanagi, MD;  Location: [name];  Service: Ophthalmology;  Laterality: Left;  Diabetic - insulin and oral meds sleep apnea   CATARACT EXTRACTION W/PHACO Right 10/07/2016   Procedure: CATARACT EXTRACTION PHACO AND INTRAOCULAR LENS PLACEMENT (IOC);  Surgeon: Grace Koyanagi, MD;  Location: [name];  Service: Ophthalmology;  Laterality: Right;  Lot# [id number] H [place]: 00:43.3 AP%: 12.2 CDE: 5.28   CORONARY ANGIOPLASTY WITH STENT PLACEMENT Left 08/14/2013   Procedure: CORONARY ANGIOPLASTY WITH STENT PLACEMENT (3.5 x 23 mm Xience Alpine DES to LAD); Location: [facility], [place]; Surgeon: Grace Huxley, MD   GANGLION CYST EXCISION Right [place]   total   TOTAL KNEE ARTHROPLASTY Right 12/18/2020   Procedure: RIGHT TOTAL KNEE ARTHROPLASTY;  Surgeon: Grace Park, MD;  Location: [name];  Service: Orthopedics;  Laterality: Right;   TOTAL KNEE ARTHROPLASTY Left 12/10/2021   Procedure: TOTAL KNEE ARTHROPLASTY;  Surgeon: Grace Park, MD;  Location: [name];  Service: Orthopedics;  Laterality: Left;    Current Outpatient Medications  Medication Sig Dispense Refill   acetaminophen (TYLENOL) 650 MG CR tablet Take 1,950 mg by mouth daily.     amLODipine (NORVASC) 2.5 MG tablet TAKE 1 TABLET BY MOUTH DAILY 90 tablet 3   aspirin EC (ASPIRIN 81) 81 MG  tablet Take 81 mg by mouth daily.     bisacodyl (DULCOLAX) 5 MG EC tablet Take 2 tablets (10 mg total) by mouth daily as needed for moderate constipation. 30 tablet 0   carvedilol (COREG) 3.125 MG tablet TAKE 1  TABLET BY MOUTH TWICE  DAILY 180 tablet 3   carvedilol (COREG) 3.125 MG tablet Take 3.125 mg by mouth daily.     docusate sodium (COLACE) 100 MG capsule Take 1 capsule (100 mg total) by mouth 2 (two) times daily. 10 capsule 0   ezetimibe (ZETIA) 10 MG tablet TAKE 1 TABLET BY MOUTH DAILY 90 tablet 2   FLUoxetine (PROZAC) 10 MG capsule TAKE 1 CAPSULE BY MOUTH DAILY 90 capsule 3   fluticasone (FLONASE) 50 MCG/ACT nasal spray Place 2 sprays into both nostrils daily. (Patient taking differently: Place 2-3 sprays into both nostrils at bedtime as needed.) 16 g 0   levothyroxine (SYNTHROID) 125 MCG tablet TAKE 1 TABLET BY MOUTH  DAILY BEFORE BREAKFAST 90 tablet 3   losartan (COZAAR) 100 MG tablet TAKE 1 TABLET BY MOUTH DAILY 90 tablet 3   metFORMIN (GLUCOPHAGE-XR) 500 MG 24 hr tablet TAKE 3 TABLETS BY MOUTH DAILY  WITH BREAKFAST 270 tablet 0   nitroGLYCERIN (NITROSTAT) 0.4 MG SL tablet Place 1 tablet (0.4 mg total) under the tongue every 5 (five) minutes as needed for chest pain. 30 tablet 11   OZEMPIC, 1 MG/DOSE, 4 MG/3ML SOPN INJECT SUBCUTANEOUSLY 1 MG  WEEKLY 3 mL 0   rosuvastatin (CRESTOR) 20 MG tablet TAKE 1 TABLET BY MOUTH DAILY 90 tablet 3   ACCU-CHEK AVIVA PLUS test Simmons U UTD BID (Patient not taking: Reported on 01/26/2022) 100 each 12   fexofenadine (ALLEGRA) 180 MG tablet Take 180 mg by mouth daily as needed for allergies or rhinitis. (Patient not taking: Reported on 11/30/2021)     oxyCODONE (OXY IR/ROXICODONE) 5 MG immediate release tablet Take 1-2 tablets (5-10 mg total) by mouth every 4 (four) hours as needed for moderate pain (pain score 4-6). (Patient not taking: Reported on 01/26/2022) 30 tablet 0   No current facility-administered medications for this visit.    Allergies:   Patient has no known allergies.   Social History:  The patient  reports that she has never smoked. She has never used smokeless tobacco. She reports that she does not drink alcohol and does not use drugs.    Family History:   family history includes Heart attack in her father; Heart disease (age of onset: 51) in her father; Heart failure in her father; Hyperlipidemia in her father; Hypertension in her father; Pulmonary fibrosis in her son.    Review of Systems: Review of Systems  Constitutional: Negative.   HENT: Negative.    Respiratory: Negative.    Cardiovascular: Negative.   Gastrointestinal: Negative.   Musculoskeletal: Negative.   Neurological: Negative.   Psychiatric/Behavioral: Negative.    All other systems reviewed and are negative.   PHYSICAL EXAM: VS:  BP (!) 80/48 (BP Location: Left Arm, Patient Position: Sitting, Cuff Size: Normal)   Pulse 74   Temp 98.2 F (36.8 C)   Ht '5\' 6"'$  (1.676 m)   Wt 223 lb 8 oz (101.4 kg)   SpO2 94%   BMI 36.07 kg/m  , BMI Body mass index is 36.07 kg/m. Constitutional:  oriented to person, place, and time. No distress.  HENT:  Head: Grossly normal Eyes:  no discharge. No scleral icterus.  Neck: No JVD, no carotid bruits  Cardiovascular:  Regular rate and rhythm, no murmurs appreciated Pulmonary/Chest: Clear to auscultation bilaterally, no wheezes or rails Abdominal: Soft.  no distension.  no tenderness.  Musculoskeletal: Normal range of motion Neurological:  normal muscle tone. Coordination normal. No atrophy Skin: Skin warm and dry Psychiatric: normal affect, pleasant  Recent Labs: 10/01/2021: TSH 1.73 12/11/2021: BUN 19; Creatinine, Ser 0.93; Hemoglobin 9.4; Platelets 178; Potassium 4.1; Sodium 136    Lipid Panel Lab Results  Component Value Date   CHOL 118 06/04/2021   HDL 55 06/04/2021   LDLCALC 50 06/04/2021   TRIG 65 06/04/2021     Wt Readings from Last 3 Encounters:  01/26/22 223 lb 8 oz (101.4 kg)  12/10/21 218 lb (98.9 kg)  11/30/21 218 lb (98.9 kg)     ASSESSMENT AND PLAN:  Febrile/viral syndrome Spiking fevers, general malaise, rash consistent with viral syndrome Recommend she hold amlodipine and losartan  for now, only take carvedilol for systolic pressure over 616 Recommend Tylenol, NSAIDs alternating Encouraged salt fluid loading given hypotension Suggest she use her COVID test at home.  No viral studies done at [facility] on recent evaluation.  Unable to exclude influenza or other viral syndrome  Coronary artery disease of native artery of native heart with stable angina pectoris ([place]) -  Some chest tightness, minimally elevated nontrending troponin in the emergency room in the setting of high blood pressure, general malaise, viral syndrome We recommended that she wait until viral syndrome resolves then could consider pharmacologic Myoview She will call us as she gets better.  Currently not in a state to have pharmacologic infusion given low blood pressure.   Essential (primary) hypertension - Plan: EKG 12-Lead Plan as above for hypotension in the setting of viral syndrome  Mixed hyperlipidemia -  on Crestor Zetia, cholesterol well controlled  Type 2 diabetes mellitus with other circulatory complication, with long-term current use of insulin ([place]) - Plan: EKG 12-Lead Weight has dropped, A1c much improved On Ozempic  Chronic kidney disease (CKD) stage G3a/A1,  Stable numbers Improved diabetes numbers  Obstructive sleep apnea on CPAP Compliant with her cpap (old machine?)  Obesity We have encouraged continued exercise, careful diet management in an effort to lose weight.  Ischemic cardiomyopathy - Plan: EKG 12-Lead EF 50 to 55% Appears euvolemic if not hypovolemic with low blood pressure   Total encounter time more than 40 minutes  Greater than 50% was spent in counseling and coordination of care with the patient   No orders of the defined types were placed in this encounter.    Signed, Esmond Plants, M.D., Ph.D. 01/26/2022  [place], [place]

## 2022-01-27 ENCOUNTER — Emergency Department: Payer: Medicare Other

## 2022-01-27 ENCOUNTER — Inpatient Hospital Stay: Payer: Medicare Other

## 2022-01-27 ENCOUNTER — Inpatient Hospital Stay: Admit: 2022-01-27 | Payer: Medicare Other

## 2022-01-27 ENCOUNTER — Ambulatory Visit: Payer: Self-pay

## 2022-01-27 ENCOUNTER — Inpatient Hospital Stay
Admission: EM | Admit: 2022-01-27 | Discharge: 2022-02-06 | DRG: 871 | Disposition: A | Discharge status: Home Health | Payer: Medicare Other | Attending: Internal Medicine | Admitting: Internal Medicine

## 2022-01-27 ENCOUNTER — Other Ambulatory Visit: Payer: Self-pay

## 2022-01-27 DIAGNOSIS — N179 Acute kidney failure, unspecified: Secondary | ICD-10-CM | POA: Diagnosis not present

## 2022-01-27 DIAGNOSIS — D696 Thrombocytopenia, unspecified: Secondary | ICD-10-CM | POA: Diagnosis not present

## 2022-01-27 DIAGNOSIS — G4733 Obstructive sleep apnea (adult) (pediatric): Secondary | ICD-10-CM

## 2022-01-27 DIAGNOSIS — A419 Sepsis, unspecified organism: Secondary | ICD-10-CM

## 2022-01-27 DIAGNOSIS — R935 Abnormal findings on diagnostic imaging of other abdominal regions, including retroperitoneum: Secondary | ICD-10-CM | POA: Diagnosis not present

## 2022-01-27 DIAGNOSIS — N182 Chronic kidney disease, stage 2 (mild): Secondary | ICD-10-CM | POA: Diagnosis present

## 2022-01-27 DIAGNOSIS — I81 Portal vein thrombosis: Secondary | ICD-10-CM | POA: Diagnosis not present

## 2022-01-27 DIAGNOSIS — N1831 Chronic kidney disease, stage 3a: Secondary | ICD-10-CM | POA: Diagnosis not present

## 2022-01-27 DIAGNOSIS — I10 Essential (primary) hypertension: Secondary | ICD-10-CM | POA: Diagnosis present

## 2022-01-27 DIAGNOSIS — Z452 Encounter for adjustment and management of vascular access device: Secondary | ICD-10-CM | POA: Diagnosis not present

## 2022-01-27 DIAGNOSIS — A4159 Other Gram-negative sepsis: Secondary | ICD-10-CM | POA: Diagnosis not present

## 2022-01-27 DIAGNOSIS — I130 Hypertensive heart and chronic kidney disease with heart failure and stage 1 through stage 4 chronic kidney disease, or unspecified chronic kidney disease: Secondary | ICD-10-CM | POA: Diagnosis not present

## 2022-01-27 DIAGNOSIS — Z434 Encounter for attention to other artificial openings of digestive tract: Secondary | ICD-10-CM | POA: Diagnosis not present

## 2022-01-27 DIAGNOSIS — R6521 Severe sepsis with septic shock: Secondary | ICD-10-CM | POA: Diagnosis present

## 2022-01-27 DIAGNOSIS — R509 Fever, unspecified: Secondary | ICD-10-CM | POA: Diagnosis not present

## 2022-01-27 DIAGNOSIS — E1122 Type 2 diabetes mellitus with diabetic chronic kidney disease: Secondary | ICD-10-CM | POA: Diagnosis not present

## 2022-01-27 DIAGNOSIS — I2510 Atherosclerotic heart disease of native coronary artery without angina pectoris: Secondary | ICD-10-CM | POA: Diagnosis present

## 2022-01-27 DIAGNOSIS — K8000 Calculus of gallbladder with acute cholecystitis without obstruction: Secondary | ICD-10-CM | POA: Diagnosis not present

## 2022-01-27 DIAGNOSIS — Z8349 Family history of other endocrine, nutritional and metabolic diseases: Secondary | ICD-10-CM

## 2022-01-27 DIAGNOSIS — K838 Other specified diseases of biliary tract: Secondary | ICD-10-CM | POA: Diagnosis not present

## 2022-01-27 DIAGNOSIS — K810 Acute cholecystitis: Secondary | ICD-10-CM | POA: Diagnosis not present

## 2022-01-27 DIAGNOSIS — Z955 Presence of coronary angioplasty implant and graft: Secondary | ICD-10-CM

## 2022-01-27 DIAGNOSIS — E785 Hyperlipidemia, unspecified: Secondary | ICD-10-CM | POA: Diagnosis not present

## 2022-01-27 DIAGNOSIS — Z8542 Personal history of malignant neoplasm of other parts of uterus: Secondary | ICD-10-CM

## 2022-01-27 DIAGNOSIS — N170 Acute kidney failure with tubular necrosis: Secondary | ICD-10-CM | POA: Diagnosis not present

## 2022-01-27 DIAGNOSIS — T85628A Displacement of other specified internal prosthetic devices, implants and grafts, initial encounter: Secondary | ICD-10-CM | POA: Diagnosis not present

## 2022-01-27 DIAGNOSIS — Z7982 Long term (current) use of aspirin: Secondary | ICD-10-CM

## 2022-01-27 DIAGNOSIS — R0689 Other abnormalities of breathing: Secondary | ICD-10-CM | POA: Diagnosis not present

## 2022-01-27 DIAGNOSIS — R112 Nausea with vomiting, unspecified: Secondary | ICD-10-CM | POA: Diagnosis not present

## 2022-01-27 DIAGNOSIS — E669 Obesity, unspecified: Secondary | ICD-10-CM | POA: Diagnosis present

## 2022-01-27 DIAGNOSIS — Z79899 Other long term (current) drug therapy: Secondary | ICD-10-CM

## 2022-01-27 DIAGNOSIS — Z6838 Body mass index (BMI) 38.0-38.9, adult: Secondary | ICD-10-CM

## 2022-01-27 DIAGNOSIS — T508X5A Adverse effect of diagnostic agents, initial encounter: Secondary | ICD-10-CM | POA: Diagnosis not present

## 2022-01-27 DIAGNOSIS — E1169 Type 2 diabetes mellitus with other specified complication: Secondary | ICD-10-CM | POA: Diagnosis not present

## 2022-01-27 DIAGNOSIS — Z743 Need for continuous supervision: Secondary | ICD-10-CM | POA: Diagnosis not present

## 2022-01-27 DIAGNOSIS — Z79890 Hormone replacement therapy (postmenopausal): Secondary | ICD-10-CM

## 2022-01-27 DIAGNOSIS — R600 Localized edema: Secondary | ICD-10-CM | POA: Diagnosis not present

## 2022-01-27 DIAGNOSIS — R1011 Right upper quadrant pain: Secondary | ICD-10-CM | POA: Diagnosis not present

## 2022-01-27 DIAGNOSIS — I5032 Chronic diastolic (congestive) heart failure: Secondary | ICD-10-CM | POA: Diagnosis not present

## 2022-01-27 DIAGNOSIS — E1121 Type 2 diabetes mellitus with diabetic nephropathy: Secondary | ICD-10-CM | POA: Diagnosis not present

## 2022-01-27 DIAGNOSIS — Y733 Surgical instruments, materials and gastroenterology and urology devices (including sutures) associated with adverse incidents: Secondary | ICD-10-CM | POA: Diagnosis not present

## 2022-01-27 DIAGNOSIS — E039 Hypothyroidism, unspecified: Secondary | ICD-10-CM | POA: Diagnosis not present

## 2022-01-27 DIAGNOSIS — D509 Iron deficiency anemia, unspecified: Secondary | ICD-10-CM

## 2022-01-27 DIAGNOSIS — Z1152 Encounter for screening for COVID-19: Secondary | ICD-10-CM | POA: Diagnosis not present

## 2022-01-27 DIAGNOSIS — K8020 Calculus of gallbladder without cholecystitis without obstruction: Secondary | ICD-10-CM | POA: Diagnosis not present

## 2022-01-27 DIAGNOSIS — E871 Hypo-osmolality and hyponatremia: Secondary | ICD-10-CM

## 2022-01-27 DIAGNOSIS — K567 Ileus, unspecified: Secondary | ICD-10-CM | POA: Diagnosis not present

## 2022-01-27 DIAGNOSIS — K819 Cholecystitis, unspecified: Principal | ICD-10-CM | POA: Diagnosis present

## 2022-01-27 DIAGNOSIS — K828 Other specified diseases of gallbladder: Secondary | ICD-10-CM | POA: Diagnosis not present

## 2022-01-27 DIAGNOSIS — T85520A Displacement of bile duct prosthesis, initial encounter: Secondary | ICD-10-CM | POA: Diagnosis not present

## 2022-01-27 DIAGNOSIS — E66811 Obesity, class 1: Secondary | ICD-10-CM | POA: Diagnosis present

## 2022-01-27 DIAGNOSIS — Z7984 Long term (current) use of oral hypoglycemic drugs: Secondary | ICD-10-CM

## 2022-01-27 DIAGNOSIS — I214 Non-ST elevation (NSTEMI) myocardial infarction: Secondary | ICD-10-CM | POA: Diagnosis present

## 2022-01-27 DIAGNOSIS — F32A Depression, unspecified: Secondary | ICD-10-CM | POA: Diagnosis present

## 2022-01-27 DIAGNOSIS — Z96653 Presence of artificial knee joint, bilateral: Secondary | ICD-10-CM | POA: Diagnosis present

## 2022-01-27 DIAGNOSIS — R109 Unspecified abdominal pain: Secondary | ICD-10-CM | POA: Diagnosis not present

## 2022-01-27 DIAGNOSIS — E8721 Acute metabolic acidosis: Secondary | ICD-10-CM | POA: Diagnosis not present

## 2022-01-27 DIAGNOSIS — Z86718 Personal history of other venous thrombosis and embolism: Secondary | ICD-10-CM

## 2022-01-27 DIAGNOSIS — F3340 Major depressive disorder, recurrent, in remission, unspecified: Secondary | ICD-10-CM | POA: Diagnosis present

## 2022-01-27 DIAGNOSIS — E890 Postprocedural hypothyroidism: Secondary | ICD-10-CM | POA: Diagnosis present

## 2022-01-27 DIAGNOSIS — Z8249 Family history of ischemic heart disease and other diseases of the circulatory system: Secondary | ICD-10-CM

## 2022-01-27 DIAGNOSIS — I499 Cardiac arrhythmia, unspecified: Secondary | ICD-10-CM | POA: Diagnosis not present

## 2022-01-27 DIAGNOSIS — I82442 Acute embolism and thrombosis of left tibial vein: Secondary | ICD-10-CM | POA: Diagnosis present

## 2022-01-27 DIAGNOSIS — R918 Other nonspecific abnormal finding of lung field: Secondary | ICD-10-CM | POA: Diagnosis not present

## 2022-01-27 DIAGNOSIS — I82409 Acute embolism and thrombosis of unspecified deep veins of unspecified lower extremity: Secondary | ICD-10-CM | POA: Diagnosis present

## 2022-01-27 DIAGNOSIS — D72829 Elevated white blood cell count, unspecified: Secondary | ICD-10-CM | POA: Diagnosis not present

## 2022-01-27 DIAGNOSIS — I252 Old myocardial infarction: Secondary | ICD-10-CM

## 2022-01-27 DIAGNOSIS — I255 Ischemic cardiomyopathy: Secondary | ICD-10-CM | POA: Diagnosis present

## 2022-01-27 DIAGNOSIS — K6389 Other specified diseases of intestine: Secondary | ICD-10-CM | POA: Diagnosis not present

## 2022-01-27 DIAGNOSIS — K5730 Diverticulosis of large intestine without perforation or abscess without bleeding: Secondary | ICD-10-CM | POA: Diagnosis not present

## 2022-01-27 LAB — COMPREHENSIVE METABOLIC PANEL
ALT: 20 U/L (ref 0–44)
AST: 35 U/L (ref 15–41)
Albumin: 2.3 g/dL — ABNORMAL LOW (ref 3.5–5.0)
Alkaline Phosphatase: 115 U/L (ref 38–126)
Anion gap: 7 (ref 5–15)
BUN: 45 mg/dL — ABNORMAL HIGH (ref 8–23)
CO2: 23 mmol/L (ref 22–32)
Calcium: 7.5 mg/dL — ABNORMAL LOW (ref 8.9–10.3)
Chloride: 102 mmol/L (ref 98–111)
Creatinine, Ser: 1.32 mg/dL — ABNORMAL HIGH (ref 0.44–1.00)
GFR, Estimated: 42 mL/min — ABNORMAL LOW (ref >60)
Glucose, Bld: 133 mg/dL — ABNORMAL HIGH (ref 70–99)
Potassium: 3.6 mmol/L (ref 3.5–5.1)
Sodium: 132 mmol/L — ABNORMAL LOW (ref 135–145)
Total Bilirubin: 0.6 mg/dL (ref 0.3–1.2)
Total Protein: 6 g/dL — ABNORMAL LOW (ref 6.5–8.1)

## 2022-01-27 LAB — LIPID PANEL
Cholesterol: 51 mg/dL (ref 0–200)
HDL: <10 mg/dL — ABNORMAL LOW (ref >40)
LDL Cholesterol: UNDETERMINED mg/dL (ref 0–99)
Total CHOL/HDL Ratio: UNDETERMINED RATIO
Triglycerides: 177 mg/dL — ABNORMAL HIGH (ref <150)
VLDL: 35 mg/dL (ref 0–40)

## 2022-01-27 LAB — TECHNOLOGIST SMEAR REVIEW: Plt Morphology: NORMAL

## 2022-01-27 LAB — CBG MONITORING, ED
Glucose-Capillary: 146 mg/dL — ABNORMAL HIGH (ref 70–99)
Glucose-Capillary: 115 mg/dL — ABNORMAL HIGH (ref 70–99)

## 2022-01-27 LAB — PROTIME-INR
INR: 1.1 (ref 0.8–1.2)
Prothrombin Time: 14.2 seconds (ref 11.4–15.2)

## 2022-01-27 LAB — CBC WITH DIFFERENTIAL/PLATELET
Abs Immature Granulocytes: 0.1 10*3/uL — ABNORMAL HIGH (ref 0.00–0.07)
Basophils Absolute: 0 10*3/uL (ref 0.0–0.1)
Basophils Relative: 0 %
Eosinophils Absolute: 0.1 10*3/uL (ref 0.0–0.5)
Eosinophils Relative: 2 %
HCT: 30.1 % — ABNORMAL LOW (ref 36.0–46.0)
Hemoglobin: 9.6 g/dL — ABNORMAL LOW (ref 12.0–15.0)
Immature Granulocytes: 2 %
Lymphocytes Relative: 5 %
Lymphs Abs: 0.4 10*3/uL — ABNORMAL LOW (ref 0.7–4.0)
MCH: 29.8 pg (ref 26.0–34.0)
MCHC: 31.9 g/dL (ref 30.0–36.0)
MCV: 93.5 fL (ref 80.0–100.0)
Monocytes Absolute: 0.5 10*3/uL (ref 0.1–1.0)
Monocytes Relative: 7 %
Neutro Abs: 5.4 10*3/uL (ref 1.7–7.7)
Neutrophils Relative %: 84 %
Platelets: 118 10*3/uL — ABNORMAL LOW (ref 150–400)
RBC: 3.22 MIL/uL — ABNORMAL LOW (ref 3.87–5.11)
RDW: 13.5 % (ref 11.5–15.5)
WBC: 6.4 10*3/uL (ref 4.0–10.5)
nRBC: 0 % (ref 0.0–0.2)

## 2022-01-27 LAB — URINALYSIS, ROUTINE W REFLEX MICROSCOPIC
Bilirubin Urine: NEGATIVE
Glucose, UA: NEGATIVE mg/dL
Hgb urine dipstick: NEGATIVE
Ketones, ur: NEGATIVE mg/dL
Nitrite: NEGATIVE
Protein, ur: 30 mg/dL — AB
Specific Gravity, Urine: 1.017 (ref 1.005–1.030)
pH: 5 (ref 5.0–8.0)

## 2022-01-27 LAB — LACTIC ACID, PLASMA
Lactic Acid, Venous: 1 mmol/L (ref 0.5–1.9)
Lactic Acid, Venous: 0.9 mmol/L (ref 0.5–1.9)

## 2022-01-27 LAB — LACTATE DEHYDROGENASE: LDH: 120 U/L (ref 98–192)

## 2022-01-27 LAB — HEPARIN LEVEL (UNFRACTIONATED): Heparin Unfractionated: 0.25 IU/mL — ABNORMAL LOW (ref 0.30–0.70)

## 2022-01-27 LAB — BRAIN NATRIURETIC PEPTIDE: B Natriuretic Peptide: 38.4 pg/mL (ref 0.0–100.0)

## 2022-01-27 LAB — RESP PANEL BY RT-PCR (FLU A&B, COVID) ARPGX2
Influenza A by PCR: NEGATIVE
Influenza B by PCR: NEGATIVE
SARS Coronavirus 2 by RT PCR: NEGATIVE

## 2022-01-27 LAB — D-DIMER, QUANTITATIVE: D-Dimer, Quant: 10.28 ug/mL-FEU — ABNORMAL HIGH (ref 0.00–0.50)

## 2022-01-27 LAB — TROPONIN I (HIGH SENSITIVITY)
Troponin I (High Sensitivity): 865 ng/L (ref <18)
Troponin I (High Sensitivity): 1003 ng/L (ref <18)
Troponin I (High Sensitivity): 3514 ng/L (ref <18)
Troponin I (High Sensitivity): 5864 ng/L (ref <18)

## 2022-01-27 LAB — PROCALCITONIN: Procalcitonin: 12.71 ng/mL

## 2022-01-27 LAB — APTT: aPTT: 42 seconds — ABNORMAL HIGH (ref 24–36)

## 2022-01-27 MED ORDER — HEPARIN BOLUS VIA INFUSION
4000.0000 [IU] | Freq: Once | INTRAVENOUS | Status: AC
  Administered 2022-01-27: 4000 [IU] via INTRAVENOUS
  Filled 2022-01-27: qty 4000

## 2022-01-27 MED ORDER — EZETIMIBE 10 MG PO TABS
10.0000 mg | ORAL_TABLET | Freq: Every day | ORAL | Status: DC
  Administered 2022-01-27 – 2022-02-06 (×9): 10 mg via ORAL
  Filled 2022-01-27 (×9): qty 1

## 2022-01-27 MED ORDER — ASPIRIN 81 MG PO CHEW
324.0000 mg | CHEWABLE_TABLET | Freq: Once | ORAL | Status: AC
  Administered 2022-01-27: 324 mg via ORAL
  Filled 2022-01-27: qty 4

## 2022-01-27 MED ORDER — METRONIDAZOLE 500 MG/100ML IV SOLN
500.0000 mg | Freq: Once | INTRAVENOUS | Status: AC
  Administered 2022-01-27: 500 mg via INTRAVENOUS
  Filled 2022-01-27: qty 100

## 2022-01-27 MED ORDER — LORAZEPAM 2 MG/ML IJ SOLN
INTRAMUSCULAR | Status: AC
  Administered 2022-01-27: 0.5 mg via INTRAVENOUS
  Filled 2022-01-27: qty 1

## 2022-01-27 MED ORDER — SODIUM CHLORIDE 0.9 % IV SOLN: INTRAVENOUS | Status: DC

## 2022-01-27 MED ORDER — ONDANSETRON HCL 4 MG/2ML IJ SOLN
4.0000 mg | Freq: Three times a day (TID) | INTRAMUSCULAR | Status: DC | PRN
  Administered 2022-01-30 – 2022-02-01 (×2): 4 mg via INTRAVENOUS
  Filled 2022-01-27 (×2): qty 2

## 2022-01-27 MED ORDER — LORAZEPAM 2 MG/ML IJ SOLN
0.5000 mg | Freq: Three times a day (TID) | INTRAMUSCULAR | Status: DC | PRN
  Administered 2022-01-28: 0.5 mg via INTRAVENOUS
  Filled 2022-01-27: qty 1

## 2022-01-27 MED ORDER — GADOBUTROL 1 MMOL/ML IV SOLN
10.0000 mL | Freq: Once | INTRAVENOUS | Status: AC | PRN
  Administered 2022-01-27: 10 mL via INTRAVENOUS

## 2022-01-27 MED ORDER — MORPHINE SULFATE (PF) 2 MG/ML IV SOLN
2.0000 mg | INTRAVENOUS | Status: DC | PRN
  Administered 2022-01-27 – 2022-01-31 (×11): 2 mg via INTRAVENOUS
  Filled 2022-01-27 (×12): qty 1

## 2022-01-27 MED ORDER — VANCOMYCIN HCL IN DEXTROSE 1-5 GM/200ML-% IV SOLN: 1000.0000 mg | Freq: Once | INTRAVENOUS | Status: DC

## 2022-01-27 MED ORDER — ROSUVASTATIN CALCIUM 10 MG PO TABS
20.0000 mg | ORAL_TABLET | Freq: Every day | ORAL | Status: DC
  Administered 2022-01-27 – 2022-02-06 (×9): 20 mg via ORAL
  Filled 2022-01-27 (×8): qty 2
  Filled 2022-01-27: qty 1

## 2022-01-27 MED ORDER — FLUOXETINE HCL 10 MG PO CAPS
10.0000 mg | ORAL_CAPSULE | Freq: Every day | ORAL | Status: DC
  Administered 2022-01-27 – 2022-02-06 (×10): 10 mg via ORAL
  Filled 2022-01-27 (×11): qty 1

## 2022-01-27 MED ORDER — PIPERACILLIN-TAZOBACTAM 3.375 G IVPB
3.3750 g | Freq: Three times a day (TID) | INTRAVENOUS | Status: DC
  Administered 2022-01-28 – 2022-02-03 (×20): 3.375 g via INTRAVENOUS
  Filled 2022-01-27 (×20): qty 50

## 2022-01-27 MED ORDER — IOHEXOL 350 MG/ML SOLN
60.0000 mL | Freq: Once | INTRAVENOUS | Status: AC | PRN
  Administered 2022-01-27: 60 mL via INTRAVENOUS

## 2022-01-27 MED ORDER — NITROGLYCERIN 0.4 MG SL SUBL: 0.4000 mg | SUBLINGUAL_TABLET | SUBLINGUAL | Status: DC | PRN

## 2022-01-27 MED ORDER — MORPHINE SULFATE (PF) 2 MG/ML IV SOLN
INTRAVENOUS | Status: AC
  Administered 2022-01-27: 2 mg via INTRAVENOUS
  Filled 2022-01-27: qty 1

## 2022-01-27 MED ORDER — ACETAMINOPHEN 325 MG PO TABS
650.0000 mg | ORAL_TABLET | Freq: Four times a day (QID) | ORAL | Status: DC | PRN
  Administered 2022-01-27 – 2022-02-05 (×5): 650 mg via ORAL
  Filled 2022-01-27 (×5): qty 2

## 2022-01-27 MED ORDER — VANCOMYCIN HCL 2000 MG/400ML IV SOLN
2000.0000 mg | Freq: Once | INTRAVENOUS | Status: AC
  Administered 2022-01-27: 2000 mg via INTRAVENOUS
  Filled 2022-01-27: qty 400

## 2022-01-27 MED ORDER — HYDRALAZINE HCL 20 MG/ML IJ SOLN: 5.0000 mg | INTRAMUSCULAR | Status: DC | PRN

## 2022-01-27 MED ORDER — SODIUM CHLORIDE 0.9 % IV BOLUS
500.0000 mL | Freq: Once | INTRAVENOUS | Status: AC
  Administered 2022-01-27: 500 mL via INTRAVENOUS

## 2022-01-27 MED ORDER — OXYCODONE HCL 5 MG PO TABS: 5.0000 mg | ORAL_TABLET | ORAL | Status: DC | PRN

## 2022-01-27 MED ORDER — INSULIN ASPART 100 UNIT/ML IJ SOLN
0.0000 [IU] | Freq: Every day | INTRAMUSCULAR | Status: DC
  Administered 2022-01-28 – 2022-02-02 (×2): 2 [IU] via SUBCUTANEOUS
  Administered 2022-02-03 – 2022-02-04 (×2): 3 [IU] via SUBCUTANEOUS
  Administered 2022-02-05: 2 [IU] via SUBCUTANEOUS
  Filled 2022-01-27 (×5): qty 1

## 2022-01-27 MED ORDER — SODIUM CHLORIDE 0.9 % IV SOLN
2.0000 g | Freq: Once | INTRAVENOUS | Status: AC
  Administered 2022-01-27: 2 g via INTRAVENOUS
  Filled 2022-01-27: qty 12.5

## 2022-01-27 MED ORDER — HEPARIN (PORCINE) 25000 UT/250ML-% IV SOLN
1200.0000 [IU]/h | INTRAVENOUS | Status: DC
  Administered 2022-01-27: 1050 [IU]/h via INTRAVENOUS
  Administered 2022-01-28: 1200 [IU]/h via INTRAVENOUS
  Filled 2022-01-27 (×2): qty 250

## 2022-01-27 MED ORDER — ASPIRIN 81 MG PO TBEC
81.0000 mg | DELAYED_RELEASE_TABLET | Freq: Every day | ORAL | Status: DC
  Administered 2022-01-29 – 2022-02-06 (×8): 81 mg via ORAL
  Filled 2022-01-27 (×9): qty 1

## 2022-01-27 MED ORDER — INSULIN ASPART 100 UNIT/ML IJ SOLN
0.0000 [IU] | Freq: Three times a day (TID) | INTRAMUSCULAR | Status: DC
  Administered 2022-01-27: 1 [IU] via SUBCUTANEOUS
  Administered 2022-01-28 – 2022-01-30 (×6): 2 [IU] via SUBCUTANEOUS
  Administered 2022-01-30: 1 [IU] via SUBCUTANEOUS
  Administered 2022-02-01 (×2): 2 [IU] via SUBCUTANEOUS
  Administered 2022-02-02: 3 [IU] via SUBCUTANEOUS
  Administered 2022-02-02 (×2): 2 [IU] via SUBCUTANEOUS
  Administered 2022-02-03: 5 [IU] via SUBCUTANEOUS
  Administered 2022-02-03: 3 [IU] via SUBCUTANEOUS
  Administered 2022-02-03: 5 [IU] via SUBCUTANEOUS
  Administered 2022-02-04: 3 [IU] via SUBCUTANEOUS
  Administered 2022-02-04 (×2): 5 [IU] via SUBCUTANEOUS
  Administered 2022-02-05: 3 [IU] via SUBCUTANEOUS
  Administered 2022-02-05: 2 [IU] via SUBCUTANEOUS
  Administered 2022-02-05: 3 [IU] via SUBCUTANEOUS
  Filled 2022-01-27 (×22): qty 1

## 2022-01-27 MED ORDER — LEVOTHYROXINE SODIUM 50 MCG PO TABS
125.0000 ug | ORAL_TABLET | Freq: Every day | ORAL | Status: DC
  Administered 2022-01-29 – 2022-02-06 (×7): 125 ug via ORAL
  Filled 2022-01-27 (×7): qty 1

## 2022-01-27 NOTE — Sepsis Progress Note (Signed)
eLink is following this Code Sepsis. °

## 2022-01-27 NOTE — ED Provider Notes (Signed)
[facility] Provider Note    Event Date/Time   First MD Initiated Contact with Patient 01/27/22 1004     (approximate)   History   Fever   HPI  [name] is a 76 y.o. female with history of remote stent placement, recent knee replacement on the left in October 2023, recent admission on 11/23 for elevated troponins in the setting of hypertension who comes in with concern for fever.  Patient reports no chest pain or shortness of breath but reports that since discharge she has had intermittent fevers that she has been taking Tylenol for.  She reports that she last took Tylenol this morning around 6 AM.  She denies any cough or congestion.  Does report 2 episodes of vomiting but reports normal bowel movements.  Denies any significant pain.  Reports she is having rigors when her temperatures go up.  She reports being seen yesterday by cardiology for a follow-up and her blood pressure was low 80/48.  She denied any shortness of breath to me but when she checked into them yesterday she reported a temp of 100.8 with hoarseness, congestion, shortness of breath and fatigue.    Physical Exam   Triage Vital Signs: ED Triage Vitals  Enc Vitals Group     BP 01/27/22 0939 112/65     Pulse Rate 01/27/22 0939 87     Resp 01/27/22 0939 18     Temp 01/27/22 0939 98.4 F (36.9 C)     Temp src --      SpO2 01/27/22 0939 98 %     Weight 01/27/22 0940 223 lb (101.2 kg)     Height 01/27/22 0940 '5\' 6"'$  (1.676 m)     Head Circumference --      Peak Flow --      Pain Score 01/27/22 0940 0     Pain Loc --      Pain Edu? --      Excl. in [place]? --     Most recent vital signs: Vitals:   01/27/22 0939  BP: 112/65  Pulse: 87  Resp: 18  Temp: 98.4 F (36.9 C)  SpO2: 98%     General: Awake, no distress.  CV:  Good peripheral perfusion.  Resp:  Normal effort.  Abd:  No distention.  Soft nontender Other:  No swelling in the legs.  No calf tenderness.  She does have  well-healing scar noted on her left knee without any erythema or redness noted   ED Results / Procedures / Treatments   Labs (all labs ordered are listed, but only abnormal results are displayed) Labs Reviewed  COMPREHENSIVE METABOLIC PANEL - Abnormal; Notable for the following components:      Result Value   Sodium 132 (*)    Glucose, Bld 133 (*)    BUN 45 (*)    Creatinine, Ser 1.32 (*)    Calcium 7.5 (*)    Total Protein 6.0 (*)    Albumin 2.3 (*)    GFR, Estimated 42 (*)    All other components within normal limits  CBC WITH DIFFERENTIAL/PLATELET - Abnormal; Notable for the following components:   RBC 3.22 (*)    Hemoglobin 9.6 (*)    HCT 30.1 (*)    Platelets 118 (*)    Lymphs Abs 0.4 (*)    Abs Immature Granulocytes 0.10 (*)    All other components within normal limits  CULTURE, BLOOD (ROUTINE X 2)  CULTURE, BLOOD (ROUTINE  X 2)  RESP PANEL BY RT-PCR (FLU A&B, COVID) ARPGX2  URINE CULTURE  LACTIC ACID, PLASMA  LACTIC ACID, PLASMA  URINALYSIS, ROUTINE W REFLEX MICROSCOPIC  PROCALCITONIN  D-DIMER, QUANTITATIVE     EKG  My interpretation of EKG:  Normal sinus at 77 without any ST elevation, no T wave versions, normal intervals  RADIOLOGY I have reviewed the xray personally and interpreted no evidence of any pneumonia  IMPRESSION: 1.  No active cardiopulmonary disease.   2.  Possible small left sided renal stone.    PROCEDURES:  Critical Care performed: Yes, see critical care procedure note(s)  .1-3 Lead EKG Interpretation  Performed by: Vanessa [place], MD Authorized by: Vanessa [place], MD     Interpretation: normal     ECG rate:  87   ECG rate assessment: normal     Rhythm: sinus rhythm     Ectopy: none     Conduction: normal   .Critical Care  Performed by: Vanessa Magness, MD Authorized by: Vanessa [place], MD   Critical care provider statement:    Critical care time (minutes):  30   Critical care was necessary to treat or prevent imminent or  life-threatening deterioration of the following conditions:  Cardiac failure   Critical care was time spent personally by me on the following activities:  Development of treatment plan with patient or surrogate, discussions with consultants, evaluation of patient's response to treatment, examination of patient, ordering and review of laboratory studies, ordering and review of radiographic studies, ordering and performing treatments and interventions, pulse oximetry, re-evaluation of patient's condition and review of old [place] ED: Medications  ceFEPIme (MAXIPIME) 2 g in sodium chloride 0.9 % 100 mL IVPB (2 g Intravenous New Bag/Given 01/27/22 1441)  metroNIDAZOLE (FLAGYL) IVPB 500 mg (has no administration in time range)  vancomycin (VANCOREADY) IVPB 2000 mg/400 mL (has no administration in time range)  heparin bolus via infusion 4,000 Units (has no administration in time range)  heparin ADULT infusion 100 units/mL (25000 units/225m) (has no administration in time range)  0.9 %  sodium chloride infusion (has no administration in time range)  ondansetron (ZOFRAN) injection 4 mg (has no administration in time range)  acetaminophen (TYLENOL) tablet 650 mg (has no administration in time range)  hydrALAZINE (APRESOLINE) injection 5 mg (has no administration in time range)  insulin aspart (novoLOG) injection 0-9 Units (has no administration in time range)  insulin aspart (novoLOG) injection 0-5 Units (has no administration in time range)  sodium chloride 0.9 % bolus 500 mL (0 mLs Intravenous Stopped 01/27/22 1224)  iohexol (OMNIPAQUE) 350 MG/ML injection 60 mL (60 mLs Intravenous Contrast Given 01/27/22 1313)     IMPRESSION / [place]DM / [place]/ ED COURSE  I reviewed the triage vital signs and the nursing notes.   Patient's presentation is most consistent with acute presentation with potential threat to life or bodily function.    Patient comes in with  intermittent fevers currently afebrile but did take Tylenol this morning.  Will proceed with sepsis workup.  Also concerning for possible PE given recent history of knee surgery.  No evidence of DVT on examination.  We discussed utility of D-dimer to further evaluate and she is willing to proceed.  Patient be given some IV fluids due to some low blood pressure patient already got 500 cc of fluid with EMS.  UA with slight amount WBCs but not really convincing for UTI.  Her COVID and flu are negative.  Her D-dimer is significantly elevated with a new AKI hemoglobin is around baseline.  Procalcitonin is also significantly elevated and given the report of fevers at home will start on broad-spectrum antibiotics to cover for possible bacteremia.  Will get CT imaging of the chest abdomen pelvis to look for any other source for infection.  She denies any falls or hitting her head no confusion  Her CT chest abdomen pelvis was negative for PE although may be some bilateral trace effusions and possible portal vein thrombosis and concern for possible cholecystitis.  Her CBD is dilated but her LFTs and T. bili are normal so MRCP was not ordered  She denies any pain currently in her gallbladder but did report it when she was admitted the hospital she was complaining of some upper abdominal pain at that time.    I attempted to consult Dr. Lysle Pearl but he was currently in surgery so I have sent him a message.  I will admit to the medical team given the NSTEMI.  Her portal vein on ultrasound is normal which is reassuring.  Patient may need IR placement of Coley tube versus cholecystectomy based upon surgery recommendations.  After patient was admitted to the hospital team her ultrasound did come back.  I did alert Dr. Blaine Hamper so he was aware of the dilated CBD and I will discuss with GI to make sure that patient does not need MRCP and possible transfer  The ultrasound did show dilated CBD.  I discussed with Dr. Haig Prophet from  GI.  He suspect that this is most likely Mirizzi syndrome given normal LFTs but did want an MRCP just to confirm.  Dr. Allen Norris is here tomorrow if patient would need ERCP so he is aware that that patient has already been admitted to the hospital team  The patient is on the cardiac monitor to evaluate for evidence of arrhythmia and/or significant heart rate changes.      FINAL CLINICAL IMPRESSION(S) / ED DIAGNOSES   Final diagnoses:  Cholecystitis  NSTEMI (non-ST elevated myocardial infarction) ([place])  Fever, unspecified fever cause     Rx / DC Orders   ED Discharge Orders     None        Note:  This document was prepared using Dragon voice recognition software and may include unintentional dictation errors.   Vanessa [place], MD 01/27/22 [phone]

## 2022-01-27 NOTE — ED Notes (Signed)
Critical troponin 865. MD made aware.

## 2022-01-27 NOTE — Telephone Encounter (Signed)
  Chief Complaint: fever since Saturday Symptoms: rigors, decreased po fluids, fever to 102, h/o low BP 80/40 in cardiologists office yesterday Frequency: Saturday Pertinent Negatives: Patient denies abdomen pain, cough, diarrhea, earache, headache, sore throat, urination pain Disposition: '[x]'$ ED /'[]'$ Urgent Care (no appt availability in office) / '[]'$ Appointment(In office/virtual)/ '[]'$  [facility] [facility]/ '[]'$ Home Care/ '[]'$ Refused Recommended Disposition /'[]'$ [place] [facility]/ '[]'$  Follow-up with PCP Additional Notes: n/a Reason for Disposition  Severe chills (i.e., feeling extremely cold WITH shaking chills)  Answer Assessment - Initial Assessment Questions 1. TEMPERATURE: "What is the most recent temperature?"  "How was it measured?"      102 2. ONSET: "When did the fever start?"      Saturday  3. CHILLS: "Do you have chills?" If yes: "How bad are they?"  (e.g., none, mild, moderate, severe)   - NONE: no chills   - MILD: feeling cold   - MODERATE: feeling very cold, some shivering (feels better under a thick blanket)   - SEVERE: feeling extremely cold with shaking chills (general body shaking, rigors; even under a thick blanket)      severe 4. OTHER SYMPTOMS: "Do you have any other symptoms besides the fever?"  (e.g., abdomen pain, cough, diarrhea, earache, headache, sore throat, urination pain)     no 5. CAUSE: If there are no symptoms, ask: "What do you think is causing the fever?"      unsure 6. CONTACTS: "Does anyone else in the family have an infection?"     UTI 7. TREATMENT: "What have you done so far to treat this fever?" (e.g., medications)     Tylenol and Ibuprofen 8. IMMUNOCOMPROMISE: "Do you have of the following: diabetes, HIV positive, splenectomy, cancer chemotherapy, chronic steroid treatment, transplant patient, etc."     *No Answer* 9. PREGNANCY: "Is there any chance you are pregnant?" "When was your last menstrual period?"     *No Answer* 10. TRAVEL: "Have you  traveled out of the country in the last month?" (e.g., travel history, exposures)       *No Answer*  Protocols used: [facility]

## 2022-01-27 NOTE — Progress Notes (Signed)
ANTICOAGULATION CONSULT NOTE  Pharmacy Consult for heparin Indication: ACS/NSTEMI  No Known Allergies  Patient Measurements: Height: '5\' 6"'$  (167.6 cm) Weight: 101.2 kg (223 lb) IBW/kg (Calculated) : 59.3 Heparin Dosing Weight: 82.2 kg  Vital Signs: Temp: 98.4 F (36.9 C) (11/29 0939) BP: 111/54 (11/29 1300) Pulse Rate: 74 (11/29 1300)  Labs: Recent Labs    01/27/22 0942  HGB 9.6*  HCT 30.1*  PLT 118*  CREATININE 1.32*  TROPONINIHS 865*    Estimated Creatinine Clearance: 43.6 mL/min (A) (by C-G formula based on SCr of 1.32 mg/dL (H)).   Medical History: Past Medical History:  Diagnosis Date   Coronary artery disease    a. 07/2013 STEMI/PCI ([place]): LM nl, LAD 100p (thrombectomy & 3.5x23 Alpine DES), LCX nl, RCA nl, EF 45%.   Depression    Diastolic dysfunction    a.) TTE 11/26/2020 - LVEF 50-55%; G2DD.   Endometrial cancer ([place])    Gravida 3 para 3    Hyperlipidemia    Hypertension    Hypertensive heart disease    Hypothyroid    Ischemic cardiomyopathy    a.) 07/2013 EF 45% @ time of STEMI; b.) 11/2013 Echo: EF 55-60%, mildly dil LA, nl RV fxn. c.) TTE 07/29/2015 - LVEF 55-60%, mild LA and RV dilation. d.) TTE 11/26/2020 - LVEF 50-55%; G2DD.   Menopause    Obstructive sleep apnea on CPAP    Osteoarthritis    PONV (postoperative nausea and vomiting)    ST elevation myocardial infarction (STEMI) of anterolateral wall ([place]) 08/14/2013   a.) LVEF reduced at 45%; Tx'd with trombectomy and PCI to 100% LAD; 3.5 x 23 mm Xience Alpine DES placed.   T2DM (type 2 diabetes mellitus) ([place])    Thyroid cancer ([place])    a.) s/p total thyroidectomy. b.) on daily levothyroxine.    Medications:  No AC meds PTA  Assessment: Pharmacy consulted to dose heparin for 76 yo F presenting with fever, SOB and found to have elevated troponin I and D-dimer. Goal of Therapy:  Heparin level 0.3-0.7 units/ml Monitor platelets by anticoagulation protocol: Yes   Plan:  Give 4000 units bolus  x 1 Start heparin infusion at 1050 units/hr Check anti-Xa level in 8 hours and daily while on heparin Continue to monitor H&H and platelets  Alison Murray 01/27/2022,2:25 PM

## 2022-01-27 NOTE — H&P (Signed)
History and Physical    [name] ION:[id number] DOB: 20-Sep-1945 DOA: 01/27/2022  Referring MD/NP/PA:   PCP: Olin Hauser, DO   Patient coming from:  The patient is coming from home.    Chief Complaint: fever, nausea, vomiting  HPI: [name] is a 76 y.o. female with medical history significant of CAD with stent placement, HLD, DM, hypothyroidism, depression, OSA, endometrial cancer, diastolic CHF, recent knee replacement, CKD-3, who presents with fever, nausea, vomiting.  Patient states that she has fever and chills for 4.5 days.  She had temperature 100.8 at home today.  She also reports nonbilious nonbloody vomiting, 2-5 times each day, no diarrhea or abdominal pain.  She states she had chest pain at Thanksgiving , which had resolved.  Patient denies any chest pain currently.  No cough, shortness of breath.  Denies symptoms of UTI.  Data reviewed independently and ED Course: pt was found to have trop 865, lactic acid 0.9, WBC 6.4, negative COVID PCR, D-dimer 10.28, temperature 98.4, blood pressure 80/48 initially, which improved to 125/63 after giving 500 cc normal saline in ED, heart rate 72, oxygen saturation 94% on room air. Chest x-ray negative for infiltration.  Patient is admitted to PCU as inpatient.  Consulted Sakai of surgery, Dr. Haig Prophet for GI, Dr. Garen Lah of cardiology  CTA Chest: 1. No evidence of pulmonary embolism. 2. Trace layering bilateral pleural effusions with mild bibasilar subsegmental atelectasis. 3. Mildly dilated pulmonary trunk suggesting pulmonary arterial hypertension. 4. Aortic and coronary artery atherosclerosis.   CT Abdomen/Pelvis:  1. Non-enhancement of the left portal vein suspicious for portal vein thrombosis. Main portal vein and right portal veins enhance homogeneously. 2. Cholelithiasis with a 4.4 cm stone in the region of the gallbladder neck. Gallbladder is moderately distended with diffuse gallbladder wall  thickening and mild intra and extrahepatic biliary ductal dilatation. Findings are concerning for acute cholecystitis. 3. Colonic diverticulosis without evidence of acute diverticulitis. 4. Aortic atherosclerosis (ICD10-I70.0).   US-RUQ 1. 2.6 cm gallstone at the gallbladder neck with gallbladder wall thickening. No Murphy sign. Findings worrisome for acute cholecystitis, despite the absence of a demonstrable Murphy sign. 2. Dilated common bile duct to 12 mm. No visible ductal stone.  LE venous doppler: Acute occlusive DVT in the left posterior tibial vein at the mid calf.  No DVT on the right.   EKG: I have personally reviewed.  Sinus rhythm, low voltage, QTc 453, left axis deviation   Review of Systems:   General: has fevers, chills, no body weight gain, has poor appetite, has fatigue HEENT: no blurry vision, hearing changes or sore throat Respiratory: no dyspnea, coughing, wheezing CV: no chest pain, no palpitations GI: has nausea, vomiting, no abdominal pain, diarrhea, constipation GU: no dysuria, burning on urination, increased urinary frequency, hematuria  Ext: has trace leg edema Neuro: no unilateral weakness, numbness, or tingling, no vision change or hearing loss Skin: no rash, no skin tear. MSK: No muscle spasm, no deformity, no limitation of range of movement in spin Heme: No easy bruising.  Travel history: No recent long distant travel.   Allergy: No Known Allergies    Past Surgical History:  Procedure Laterality Date   CATARACT EXTRACTION W/PHACO Left 08/30/2016   Procedure: CATARACT EXTRACTION PHACO AND INTRAOCULAR LENS PLACEMENT ([place]) Left daibetic;  Surgeon: Leandrew Koyanagi, MD;  Location: [place];  Service: Ophthalmology;  Laterality: Left;  Diabetic - insulin and oral meds sleep apnea   CATARACT EXTRACTION W/PHACO Right 10/07/2016  Procedure: CATARACT EXTRACTION PHACO AND INTRAOCULAR LENS PLACEMENT ([facility]);  Surgeon: Leandrew Koyanagi,  MD;  Location: [facility];  Service: Ophthalmology;  Laterality: Right;  Lot# [id number] H [place]: 00:43.3 AP%: 12.2 CDE: 5.28   CORONARY ANGIOPLASTY WITH STENT PLACEMENT Left 08/14/2013   Procedure: CORONARY ANGIOPLASTY WITH STENT PLACEMENT (3.5 x 23 mm Xience [place] DES to LAD); Location: [facility], [place]; Surgeon: Algernon Huxley, MD   GANGLION CYST EXCISION Right [place]   total   TOTAL KNEE ARTHROPLASTY Right 12/18/2020   Procedure: RIGHT TOTAL KNEE ARTHROPLASTY;  Surgeon: Thornton Park, MD;  Location: [facility];  Service: Orthopedics;  Laterality: Right;   TOTAL KNEE ARTHROPLASTY Left 12/10/2021   Procedure: TOTAL KNEE ARTHROPLASTY;  Surgeon: Thornton Park, MD;  Location: [facility];  Service: Orthopedics;  Laterality: Left;    Social History:  reports that she has never smoked. She has never used smokeless tobacco. She reports that she does not drink alcohol and does not use drugs.  Family History:  Family History  Problem Relation Age of Onset   Heart attack Father    Heart failure Father    Hypertension Father    Hyperlipidemia Father    Heart disease Father 6       CABG    Pulmonary fibrosis Son    Breast cancer Neg Hx      Prior to Admission medications   Medication Sig Start Date End Date Taking? Authorizing Provider  ondansetron (ZOFRAN-ODT) 4 MG disintegrating tablet Take 4 mg by mouth every 8 (eight) hours as needed. 01/23/22  Yes [provider]  traMADol (ULTRAM) 50 MG tablet Take 50 mg by mouth every 6 (six) hours as needed. 12/23/21  Yes [provider]  ACCU-CHEK AVIVA PLUS test strip U UTD BID Patient not taking: Reported on 01/26/2022 11/10/16   Olin Hauser, DO  acetaminophen (TYLENOL) 650 MG CR tablet Take 1,950 mg by mouth daily.    [provider]  amLODipine (NORVASC) 2.5 MG tablet TAKE 1 TABLET BY MOUTH DAILY 06/16/21   Minna Merritts, MD  aspirin EC  (ASPIRIN 81) 81 MG tablet Take 81 mg by mouth daily.    [provider]  bisacodyl (DULCOLAX) 5 MG EC tablet Take 2 tablets (10 mg total) by mouth daily as needed for moderate constipation. 12/11/21   Thornton Park, MD  carvedilol (COREG) 3.125 MG tablet TAKE 1 TABLET BY MOUTH TWICE  DAILY 07/28/21   Minna Merritts, MD  carvedilol (COREG) 3.125 MG tablet Take 3.125 mg by mouth daily.    [provider]  docusate sodium (COLACE) 100 MG capsule Take 1 capsule (100 mg total) by mouth 2 (two) times daily. 12/11/21   Thornton Park, MD  ezetimibe (ZETIA) 10 MG tablet TAKE 1 TABLET BY MOUTH DAILY 07/02/21   Minna Merritts, MD  fexofenadine (ALLEGRA) 180 MG tablet Take 180 mg by mouth daily as needed for allergies or rhinitis. Patient not taking: Reported on 11/30/2021    [provider]  FLUoxetine (PROZAC) 10 MG capsule TAKE 1 CAPSULE BY MOUTH DAILY 12/07/21   Parks Ranger, Devonne Doughty, DO  fluticasone (FLONASE) 50 MCG/ACT nasal spray Place 2 sprays into both nostrils daily. Patient taking differently: Place 2-3 sprays into both nostrils at bedtime as needed. 02/18/21   Brunetta Jeans, PA-C  levothyroxine (SYNTHROID) 125 MCG tablet TAKE 1 TABLET BY MOUTH  DAILY BEFORE BREAKFAST 09/29/21   Nobie Putnam  J, DO  losartan (COZAAR) 100 MG tablet TAKE 1 TABLET BY MOUTH DAILY 08/17/21   Minna Merritts, MD  metFORMIN (GLUCOPHAGE-XR) 500 MG 24 hr tablet TAKE 3 TABLETS BY MOUTH DAILY  WITH BREAKFAST 01/08/22   Karamalegos, Devonne Doughty, DO  nitroGLYCERIN (NITROSTAT) 0.4 MG SL tablet Place 1 tablet (0.4 mg total) under the tongue every 5 (five) minutes as needed for chest pain. 01/29/20   Minna Merritts, MD  oxyCODONE (OXY IR/ROXICODONE) 5 MG immediate release tablet Take 1-2 tablets (5-10 mg total) by mouth every 4 (four) hours as needed for moderate pain (pain score 4-6). Patient not taking: Reported on 01/26/2022 12/11/21   Thornton Park, MD  [facility], 1 MG/DOSE, 4  MG/3ML SOPN INJECT SUBCUTANEOUSLY 1 MG  WEEKLY 01/18/22   Parks Ranger, Devonne Doughty, DO  rosuvastatin (CRESTOR) 20 MG tablet TAKE 1 TABLET BY MOUTH DAILY 07/28/21   Minna Merritts, MD    Physical Exam: Vitals:   01/27/22 1130 01/27/22 1200 01/27/22 1230 01/27/22 1300  BP: (!) 118/50 (!) 115/55 (!) 113/50 (!) 111/54  Pulse: 78 76 73 74  Resp: (!) '29 18 17 '$ (!) 22  Temp:      SpO2: 100% 98% 99% 98%  Weight:      Height:       General: Not in acute distress HEENT:       Eyes: PERRL, EOMI, no scleral icterus.       ENT: No discharge from the ears and nose, no pharynx injection, no tonsillar enlargement.        Neck: No JVD, no bruit, no mass felt. Heme: No neck lymph node enlargement. Cardiac: S1/S2, RRR, No murmurs, No gallops or rubs. Respiratory: No rales, wheezing, rhonchi or rubs. GI: Soft, nondistended, nontender, no rebound pain, no organomegaly, BS present. GU: No hematuria Ext: has trace leg edema bilaterally. 1+DP/PT pulse bilaterally. Musculoskeletal: No joint deformities, No joint redness or warmth, no limitation of ROM in spin. Skin: No rashes.  Neuro: Alert, oriented X3, cranial nerves II-XII grossly intact, moves all extremities normally.  Psych: Patient is not psychotic, no suicidal or hemocidal ideation.  Labs on Admission: I have personally reviewed following labs and imaging studies  CBC: Recent Labs  Lab 01/27/22 0942  WBC 6.4  NEUTROABS 5.4  HGB 9.6*  HCT 30.1*  MCV 93.5  PLT 671*   Basic Metabolic Panel: Recent Labs  Lab 01/27/22 0942  NA 132*  K 3.6  CL 102  CO2 23  GLUCOSE 133*  BUN 45*  CREATININE 1.32*  CALCIUM 7.5*   GFR: Estimated Creatinine Clearance: 43.6 mL/min (A) (by C-G formula based on SCr of 1.32 mg/dL (H)). Liver Function Tests: Recent Labs  Lab 01/27/22 0942  AST 35  ALT 20  ALKPHOS 115  BILITOT 0.6  PROT 6.0*  ALBUMIN 2.3*   No results for input(s): "LIPASE", "AMYLASE" in the last 168 hours. No results for  input(s): "AMMONIA" in the last 168 hours. Coagulation Profile: Recent Labs  Lab 01/27/22 1446  INR 1.1   Cardiac Enzymes: No results for input(s): "CKTOTAL", "CKMB", "CKMBINDEX", "TROPONINI" in the last 168 hours. BNP (last 3 results) No results for input(s): "PROBNP" in the last 8760 hours. HbA1C: No results for input(s): "HGBA1C" in the last 72 hours. CBG: Recent Labs  Lab 01/27/22 1644  GLUCAP 146*   Lipid Profile: Recent Labs    01/27/22 1450  CHOL 51  HDL <10*  LDLCALC UNABLE TO CALCULATE  TRIG 177*  CHOLHDL UNABLE  TO CALCULATE   Thyroid Function Tests: No results for input(s): "TSH", "T4TOTAL", "FREET4", "T3FREE", "THYROIDAB" in the last 72 hours. Anemia Panel: No results for input(s): "VITAMINB12", "FOLATE", "FERRITIN", "TIBC", "IRON", "RETICCTPCT" in the last 72 hours. Urine analysis:    Component Value Date/Time   COLORURINE YELLOW (A) 01/27/2022 1103   APPEARANCEUR CLOUDY (A) 01/27/2022 1103   LABSPEC 1.017 01/27/2022 1103   PHURINE 5.0 01/27/2022 1103   GLUCOSEU NEGATIVE 01/27/2022 1103   HGBUR NEGATIVE 01/27/2022 1103   BILIRUBINUR NEGATIVE 01/27/2022 1103   KETONESUR NEGATIVE 01/27/2022 1103   PROTEINUR 30 (A) 01/27/2022 1103   NITRITE NEGATIVE 01/27/2022 1103   LEUKOCYTESUR SMALL (A) 01/27/2022 1103   Sepsis Labs: '@LABRCNTIP'$ (procalcitonin:4,lacticidven:4) ) Recent Results (from the past 240 hour(s))  Resp Panel by RT-PCR (Flu A&B, Covid) Urine, Clean Catch     Status: None   Collection Time: 01/27/22 10:48 AM   Specimen: Urine, Clean Catch; Nasal Swab  Result Value Ref Range Status   SARS Coronavirus 2 by RT PCR NEGATIVE NEGATIVE Final    Comment: (NOTE) SARS-CoV-2 target nucleic acids are NOT DETECTED.  The SARS-CoV-2 RNA is generally detectable in upper respiratory specimens during the acute phase of infection. The lowest concentration of SARS-CoV-2 viral copies this assay can detect is 138 copies/mL. A negative result does not preclude  SARS-Cov-2 infection and should not be used as the sole basis for treatment or other patient management decisions. A negative result may occur with  improper specimen collection/handling, submission of specimen other than nasopharyngeal swab, presence of viral mutation(s) within the areas targeted by this assay, and inadequate number of viral copies(<138 copies/mL). A negative result must be combined with clinical observations, patient history, and epidemiological information. The expected result is Negative.  Fact Sheet for Patients:  EntrepreneurPulse.com.au  Fact Sheet for Healthcare Providers:  IncredibleEmployment.be  This test is no t yet approved or cleared by the [place] FDA and  has been authorized for detection and/or diagnosis of SARS-CoV-2 by FDA under an Emergency Use Authorization (EUA). This EUA will remain  in effect (meaning this test can be used) for the duration of the COVID-19 declaration under Section 564(b)(1) of the Act, 21 U.S.C.section 360bbb-3(b)(1), unless the authorization is terminated  or revoked sooner.       Influenza A by PCR NEGATIVE NEGATIVE Final   Influenza B by PCR NEGATIVE NEGATIVE Final    Comment: (NOTE) The Xpert Xpress SARS-CoV-2/FLU/RSV plus assay is intended as an aid in the diagnosis of influenza from Nasopharyngeal swab specimens and should not be used as a sole basis for treatment. Nasal washings and aspirates are unacceptable for Xpert Xpress SARS-CoV-2/FLU/RSV testing.  Fact Sheet for Patients: EntrepreneurPulse.com.au  Fact Sheet for Healthcare Providers: IncredibleEmployment.be  This test is not yet approved or cleared by the [place] FDA and has been authorized for detection and/or diagnosis of SARS-CoV-2 by FDA under an Emergency Use Authorization (EUA). This EUA will remain in effect (meaning this test can be used) for the duration of  the COVID-19 declaration under Section 564(b)(1) of the Act, 21 U.S.C. section 360bbb-3(b)(1), unless the authorization is terminated or revoked.  Performed at [facility], [address] [place], [place] [address]      Radiological Exams on Admission: US Venous Img Lower Bilateral (DVT)  Result Date: 01/27/2022 CLINICAL DATA:  Knee replacements bilaterally in October with positive D-dimer EXAM: BILATERAL LOWER EXTREMITY VENOUS DOPPLER ULTRASOUND TECHNIQUE: Gray-scale sonography with graded compression, as well as color Doppler and  duplex ultrasound were performed to evaluate the lower extremity deep venous systems from the level of the common femoral vein and including the common femoral, femoral, profunda femoral, popliteal and calf veins including the posterior tibial, peroneal and gastrocnemius veins when visible. The superficial great saphenous vein was also interrogated. Spectral Doppler was utilized to evaluate flow at rest and with distal augmentation maneuvers in the common femoral, femoral and popliteal veins. COMPARISON:  None Available. FINDINGS: RIGHT LOWER EXTREMITY Common Femoral Vein: No evidence of thrombus. Normal compressibility, respiratory phasicity and response to augmentation. Saphenofemoral Junction: No evidence of thrombus. Normal compressibility and flow on color Doppler imaging. Profunda Femoral Vein: No evidence of thrombus. Normal compressibility and flow on color Doppler imaging. Femoral Vein: No evidence of thrombus. Normal compressibility, respiratory phasicity and response to augmentation. Popliteal Vein: No evidence of thrombus. Normal compressibility, respiratory phasicity and response to augmentation. Calf Veins: No evidence of thrombus. Normal compressibility and flow on color Doppler imaging. Superficial Great Saphenous Vein: No evidence of thrombus. Normal compressibility. Venous Reflux:  None. Other Findings:  None. LEFT LOWER EXTREMITY Common Femoral  Vein: No evidence of thrombus. Normal compressibility, respiratory phasicity and response to augmentation. Saphenofemoral Junction: No evidence of thrombus. Normal compressibility and flow on color Doppler imaging. Profunda Femoral Vein: No evidence of thrombus. Normal compressibility and flow on color Doppler imaging. Femoral Vein: No evidence of thrombus. Normal compressibility, respiratory phasicity and response to augmentation. Popliteal Vein: No evidence of thrombus. Normal compressibility, respiratory phasicity and response to augmentation. Calf Veins: The posterior tibial vein is occluded at the mid calf due to acute DVT. Superficial Great Saphenous Vein: No evidence of thrombus. Normal compressibility. Venous Reflux:  None. Other Findings:  None. IMPRESSION: Acute occlusive DVT in the left posterior tibial vein at the mid calf. No DVT on the right. These results were called by telephone at the time of interpretation on 01/27/2022 at 6:35 pm to provider Ivor Costa , who verbally acknowledged these results. Electronically Signed   By: Placido Sou M.D.   On: 01/27/2022 18:36   US ABDOMEN LIMITED RUQ (LIVER/GB)  Result Date: 01/27/2022 CLINICAL DATA:  Right upper quadrant abdominal pain EXAM: ULTRASOUND ABDOMEN LIMITED RIGHT UPPER QUADRANT COMPARISON:  CT same day FINDINGS: Gallbladder: Gallbladder wall thickening up to 9 mm. No Murphy sign. 2.6 cm gallstone at the gallbladder neck. Findings worrisome for acute cholecystitis despite the absence of a Murphy sign. Common bile duct: Diameter: Dilated to 12 mm.  No visible ductal stone. Liver: Normal appearance of the liver parenchyma itself. Portal vein is patent on color Doppler imaging with normal direction of blood flow towards the liver. Other: None. IMPRESSION: 1. 2.6 cm gallstone at the gallbladder neck with gallbladder wall thickening. No Murphy sign. Findings worrisome for acute cholecystitis, despite the absence of a demonstrable Murphy sign. 2.  Dilated common bile duct to 12 mm. No visible ductal stone. Electronically Signed   By: Nelson Chimes M.D.   On: 01/27/2022 14:41   CT Angio Chest PE W and/or Wo Contrast  Result Date: 01/27/2022 CLINICAL DATA:  Pulmonary embolism (PE) suspected, high prob; Abdominal pain, acute, nonlocalized EXAM: CT ANGIOGRAPHY CHEST CT ABDOMEN AND PELVIS WITH CONTRAST TECHNIQUE: Multidetector CT imaging of the chest was performed using the standard protocol during bolus administration of intravenous contrast. Multiplanar CT image reconstructions and MIPs were obtained to evaluate the vascular anatomy. Multidetector CT imaging of the abdomen and pelvis was performed using the standard protocol during bolus administration of intravenous contrast.  RADIATION DOSE REDUCTION: This exam was performed according to the departmental dose-optimization program which includes automated exposure control, adjustment of the mA and/or kV according to patient size and/or use of iterative reconstruction technique. CONTRAST:  39m OMNIPAQUE IOHEXOL 350 MG/ML SOLN COMPARISON:  None Available. FINDINGS: CTA CHEST FINDINGS Cardiovascular: Satisfactory opacification of the pulmonary arteries to the segmental level. No evidence of pulmonary embolism. Pulmonary trunk measures 3.5 cm in diameter. Thoracic aorta is nonaneurysmal. Scattered atherosclerotic vascular calcifications of the aorta and coronary arteries. Heart size is upper limits of normal. No pericardial effusion. Mediastinum/Nodes: No enlarged mediastinal, hilar, or axillary lymph nodes. Thyroid gland, trachea, and esophagus demonstrate no significant findings. Lungs/Pleura: Trace layering bilateral pleural effusions. Mild bibasilar subsegmental atelectasis. No pneumothorax. Musculoskeletal: No chest wall abnormality. No acute or significant osseous findings. Degenerative thoracic spondylosis. Review of the MIP images confirms the above findings. CT ABDOMEN and PELVIS FINDINGS  Hepatobiliary: 4.4 cm stone in the region of the gallbladder neck. Numerous small stones and/or sludge layering within the gallbladder fundus. Gallbladder is moderately distended with diffuse gallbladder wall thickening. No focal liver lesion is identified. There is mild intra and extrahepatic biliary ductal dilatation. Pancreas: Fatty atrophy of the pancreas. No focal pancreatic lesion. No ductal dilatation or inflammatory changes. Spleen: Normal in size without focal abnormality. Adrenals/Urinary Tract: Unremarkable adrenal glands. Kidneys enhance symmetrically without solid lesion, stone, or hydronephrosis. Ureters are nondilated. Urinary bladder appears unremarkable for the degree of distention. Stomach/Bowel: Stomach is within normal limits. Appendix appears normal. Scattered colonic diverticulosis. No evidence of bowel wall thickening, distention, or inflammatory changes. Vascular/Lymphatic: Non-enhancement of the left portal vein suspicious for portal vein thrombosis (series 2, image 26). Main portal vein and right portal veins enhance homogeneously. Aortoiliac atherosclerosis. No abdominopelvic lymphadenopathy. Reproductive: Status post hysterectomy. No adnexal masses. Other: No free fluid. No abdominopelvic fluid collection. No pneumoperitoneum. Musculoskeletal: No acute or significant osseous findings. Degenerative lumbar spondylosis. Review of the MIP images confirms the above findings. IMPRESSION: CTA Chest: 1. No evidence of pulmonary embolism. 2. Trace layering bilateral pleural effusions with mild bibasilar subsegmental atelectasis. 3. Mildly dilated pulmonary trunk suggesting pulmonary arterial hypertension. 4. Aortic and coronary artery atherosclerosis. CT Abdomen/Pelvis: 1. Non-enhancement of the left portal vein suspicious for portal vein thrombosis. Main portal vein and right portal veins enhance homogeneously. 2. Cholelithiasis with a 4.4 cm stone in the region of the gallbladder neck.  Gallbladder is moderately distended with diffuse gallbladder wall thickening and mild intra and extrahepatic biliary ductal dilatation. Findings are concerning for acute cholecystitis. 3. Colonic diverticulosis without evidence of acute diverticulitis. 4. Aortic atherosclerosis (ICD10-I70.0). Electronically Signed   By: NDavina PokeD.O.   On: 01/27/2022 13:38   CT ABDOMEN PELVIS W CONTRAST  Result Date: 01/27/2022 CLINICAL DATA:  Pulmonary embolism (PE) suspected, high prob; Abdominal pain, acute, nonlocalized EXAM: CT ANGIOGRAPHY CHEST CT ABDOMEN AND PELVIS WITH CONTRAST TECHNIQUE: Multidetector CT imaging of the chest was performed using the standard protocol during bolus administration of intravenous contrast. Multiplanar CT image reconstructions and MIPs were obtained to evaluate the vascular anatomy. Multidetector CT imaging of the abdomen and pelvis was performed using the standard protocol during bolus administration of intravenous contrast. RADIATION DOSE REDUCTION: This exam was performed according to the departmental dose-optimization program which includes automated exposure control, adjustment of the mA and/or kV according to patient size and/or use of iterative reconstruction technique. CONTRAST:  633mOMNIPAQUE IOHEXOL 350 MG/ML SOLN COMPARISON:  None Available. FINDINGS: CTA CHEST FINDINGS Cardiovascular: Satisfactory opacification of  the pulmonary arteries to the segmental level. No evidence of pulmonary embolism. Pulmonary trunk measures 3.5 cm in diameter. Thoracic aorta is nonaneurysmal. Scattered atherosclerotic vascular calcifications of the aorta and coronary arteries. Heart size is upper limits of normal. No pericardial effusion. Mediastinum/Nodes: No enlarged mediastinal, hilar, or axillary lymph nodes. Thyroid gland, trachea, and esophagus demonstrate no significant findings. Lungs/Pleura: Trace layering bilateral pleural effusions. Mild bibasilar subsegmental atelectasis. No  pneumothorax. Musculoskeletal: No chest wall abnormality. No acute or significant osseous findings. Degenerative thoracic spondylosis. Review of the MIP images confirms the above findings. CT ABDOMEN and PELVIS FINDINGS Hepatobiliary: 4.4 cm stone in the region of the gallbladder neck. Numerous small stones and/or sludge layering within the gallbladder fundus. Gallbladder is moderately distended with diffuse gallbladder wall thickening. No focal liver lesion is identified. There is mild intra and extrahepatic biliary ductal dilatation. Pancreas: Fatty atrophy of the pancreas. No focal pancreatic lesion. No ductal dilatation or inflammatory changes. Spleen: Normal in size without focal abnormality. Adrenals/Urinary Tract: Unremarkable adrenal glands. Kidneys enhance symmetrically without solid lesion, stone, or hydronephrosis. Ureters are nondilated. Urinary bladder appears unremarkable for the degree of distention. Stomach/Bowel: Stomach is within normal limits. Appendix appears normal. Scattered colonic diverticulosis. No evidence of bowel wall thickening, distention, or inflammatory changes. Vascular/Lymphatic: Non-enhancement of the left portal vein suspicious for portal vein thrombosis (series 2, image 26). Main portal vein and right portal veins enhance homogeneously. Aortoiliac atherosclerosis. No abdominopelvic lymphadenopathy. Reproductive: Status post hysterectomy. No adnexal masses. Other: No free fluid. No abdominopelvic fluid collection. No pneumoperitoneum. Musculoskeletal: No acute or significant osseous findings. Degenerative lumbar spondylosis. Review of the MIP images confirms the above findings. IMPRESSION: CTA Chest: 1. No evidence of pulmonary embolism. 2. Trace layering bilateral pleural effusions with mild bibasilar subsegmental atelectasis. 3. Mildly dilated pulmonary trunk suggesting pulmonary arterial hypertension. 4. Aortic and coronary artery atherosclerosis. CT Abdomen/Pelvis: 1.  Non-enhancement of the left portal vein suspicious for portal vein thrombosis. Main portal vein and right portal veins enhance homogeneously. 2. Cholelithiasis with a 4.4 cm stone in the region of the gallbladder neck. Gallbladder is moderately distended with diffuse gallbladder wall thickening and mild intra and extrahepatic biliary ductal dilatation. Findings are concerning for acute cholecystitis. 3. Colonic diverticulosis without evidence of acute diverticulitis. 4. Aortic atherosclerosis (ICD10-I70.0). Electronically Signed   By: Davina Poke D.O.   On: 01/27/2022 13:38   DG Chest 2 View  Result Date: 01/27/2022 CLINICAL DATA:  Fevers EXAM: CHEST - 2 VIEW COMPARISON:  08/03/16 CXR FINDINGS: No pleural effusion. No pneumothorax. Normal cardiac and mediastinal contours. No displaced rib fractures. Degenerative changes of the bilateral glenohumeral joints, progressed on the right compared to 2018. Vertebral body heights are maintained. Rounded densities in the upper abdomen only visualized on the lateral view could represent a combination of renal and gallstones. IMPRESSION: 1.  No active cardiopulmonary disease. 2.  Possible small left sided renal stone. Electronically Signed   By: Marin Roberts M.D.   On: 01/27/2022 10:06      Assessment/Plan Principal Problem:   NSTEMI (non-ST elevated myocardial infarction) ([place]) Active Problems:   CAD (coronary artery disease)   Cholecystitis   Controlled type 2 diabetes mellitus with diabetic nephropathy (HCC)   HTN (hypertension)   Hypothyroidism   Hyperlipidemia associated with type 2 diabetes mellitus (HCC)   Chronic diastolic CHF (congestive heart failure) (HCC)   Thrombocytopenia (HCC)   Major depressive disorder, recurrent, in remission ([place])   Obstructive sleep apnea on CPAP   Chronic kidney  disease, stage 3a ([place])   Portal vein thrombosis   Obesity (BMI 30.0-34.9)   DVT (deep venous thrombosis) (HCC)   Assessment and Plan:  NSTEMI  (non-ST elevated myocardial infarction) and hx of CAD: s/p of stent. T rop  865. Consulted Dr. Garen Lah.  -will admit to PCU as inpt -IV heparin -ASA, Zetia and Crestor -trend trop -check A1c and FLP  Cholecystitis: WBC 6.4, temperature 98.4, heart rate 72, RR 87, does not meet criteria for sepsis.  Consulted Dr. Lysle Pearl of surgery. -IV Zosyn (patient received 1 dose of vancomycin, cefepime and Flagyl in ED).  Patient has dilated CBD.  Consulted Dr. Haig Prophet of GI. -Blood culture -As needed morphine, Zofran, oxycodone -f/u MRCP  Controlled type 2 diabetes mellitus with diabetic nephropathy [facility]): Recent A1c 6.6, well-controlled.  Patient taking metformin and Ozempic at home. -SSI  HTN (hypertension) -Hold home blood pressure medications including amlodipine, Coreg, Cozaar due to soft blood pressure -IV hydralazine as needed  Hypothyroidism -Synthroid  Hyperlipidemia associated with type 2 diabetes mellitus (HCC) -Cresto and Zetia  Chronic diastolic CHF (congestive heart failure) ([place]): Recent 2D echo 11/26/2020 showed EF of 50-55% with grade 2 diastolic dysfunction.  Patient has trace leg edema, does not seem to have CHF exacerbation. -Check BNP  Thrombocytopenia ([place]): Platelet 118 -f/u by CBC -Check LDH and peripheral smear  Major depressive disorder, recurrent, in remission ([place]) -Continue home medications  Obstructive sleep apnea - on CPAP  Chronic kidney disease, stage 3a ([place]): Renal function is worsening than baseline.  Recent baseline creatinine 0.9 01/11/2022.  Her creatinine is 1.32, BUN 45. -IV fluid: 500 cc normal saline, then 75 cc/h -Hold Cozaar  Portal vein thrombosis -On IV heparin  Obesity (BMI 30.0-34.9): BMI= 35.99  and BW=101.2 -Diet and exercise.   -Encourage to lose weight.  DVT (deep venous thrombosis) ([place]): CTA negative for PE -IV heparin      DVT ppx: On IV Heparin      Code Status: Full code  Family Communication: Yes, patient's  daughter    at bed side.     Disposition Plan:  Anticipate discharge back to previous environment  Consults called: Hessie Diener of surgery, Dr. Haig Prophet for GI, Dr. Garen Lah of cardiology   Admission status and Level of care: Progressive: as inpt        Dispo: The patient is from: Home              Anticipated d/c is to: Home              Anticipated d/c date is: 3 days              Patient currently is not medically stable to d/c.    Severity of Illness:  The appropriate patient status for this patient is INPATIENT. Inpatient status is judged to be reasonable and necessary in order to provide the required intensity of service to ensure the patient's safety. The patient's presenting symptoms, physical exam findings, and initial radiographic and laboratory data in the context of their chronic comorbidities is felt to place them at high risk for further clinical deterioration. Furthermore, it is not anticipated that the patient will be medically stable for discharge from the hospital within 2 midnights of admission.   * I certify that at the point of admission it is my clinical judgment that the patient will require inpatient hospital care spanning beyond 2 midnights from the point of admission due to high intensity of service, high risk for further  deterioration and high frequency of surveillance required.*       Date of Service 01/27/2022    Ivor Costa [facility]   If 7PM-7AM, please contact night-coverage [email or web address] 01/27/2022, 7:45 PM

## 2022-01-27 NOTE — ED Notes (Signed)
Pt heparin drip transferred over to the MRI IV pump. Metta Clines, RN verified rate.

## 2022-01-27 NOTE — Consult Note (Signed)
PHARMACY -  BRIEF ANTIBIOTIC NOTE   Pharmacy has received consult(s) for vancomycin and cefepime from an ED provider.  The patient's profile has been reviewed for ht/wt/allergies/indication/available labs.    One time order(s) placed for vancomycin '2000mg'$  IV x 1 and cefepime 2gm IV x 1  Further antibiotics/pharmacy consults should be ordered by admitting physician if indicated.                       Thank you, Alison Murray 01/27/2022  2:09 PM

## 2022-01-27 NOTE — ED Triage Notes (Signed)
Pt to ED [facility] from home for ongoing fever since 5 days, has been taking tylenol  EMS T 101.0 oral, 93 HR, 12 lead normal, 96% on RA, BO 100/46 with MAP of 60  Pt states may have UTI because has hx UTIs with only symptom being fever  Alert, oriented. 22g to R hand, 244m NS by EMS  Last tylenol taken:  3 ES ('650mg'$  each) taken at 0600  Also complains of N/V  VS now are 112/65, temp 98.4, SPO2 98%

## 2022-01-27 NOTE — Progress Notes (Signed)
Pharmacy Antibiotic Note  [name] is a 76 y.o. female admitted on 01/27/2022 with Cholecystitis.  Pharmacy has been consulted for Zosyn dosing.  Plan: Zosyn 3.375g IV q8h (4 hour infusion).    Height: '5\' 6"'$  (167.6 cm) Weight: 101.2 kg (223 lb) IBW/kg (Calculated) : 59.3  Temp (24hrs), Avg:98.4 F (36.9 C), Min:98.4 F (36.9 C), Max:98.4 F (36.9 C)  Recent Labs  Lab 01/27/22 0942 01/27/22 1048 01/27/22 1308  WBC 6.4  --   --   CREATININE 1.32*  --   --   LATICACIDVEN  --  1.0 0.9    Estimated Creatinine Clearance: 43.6 mL/min (A) (by C-G formula based on SCr of 1.32 mg/dL (H)).    No Known Allergies  Antimicrobials this admission: Cefepime 11/29  x1 Metronidazole x 1 11/29 Vanc x 1 11/29 zosyn 11/30 >>    Dose adjustments this admission:    Microbiology results: 11/29 BCx: p 11/29 UCx: p    Sputum:      MRSA PCR:    Thank you for allowing pharmacy to be a part of this patient's care.  Rendy Lazard A 01/27/2022 8:44 PM

## 2022-01-27 NOTE — ED Notes (Signed)
PT resting at this time.

## 2022-01-27 NOTE — Consult Note (Signed)
Cardiology Consultation   Patient ID: LUCIANNA OSTLUND MRN: [id number]; DOB: 14-Apr-1945  Admit date: 01/27/2022 Date of Consult: 01/27/2022  PCP:  Olin Hauser, [place] [facility] Cardiologist:  Ida Rogue, MD        Patient Profile:   [name] is a 76 y.o. female with a hx of CAD/PCI who is being seen 01/27/2022 for the evaluation of elevated troponins at the request of Dr. Blaine Hamper.  History of Present Illness:   Ms. Goodchild is a 76 year old female with history of CAD/PCI to proximal LAD 2015, hypertension, hyperlipidemia, diabetes, obesity presenting to the hospital with fevers and rigors.  Patient complain of chest pain about 2 days ago, went to the ED at [facility], EKG showed no acute changes, troponins were 79, 91.  Followed up yesterday with primary cardiologist, blood pressures noted to be low with systolic in the 37S.  She was advised to hold BP meds.   She felt sick today with fevers, reported temp of about 100.8, rigors causing her to come to the ED.  In the ED upon admission, she was afebrile, workup with CT chest abdomen revealed possible portal vein thrombosis, distended gallbladder, 4.4 cm stone in the region of the gallbladder neck, thickening gallbladder suggesting cholecystitis.  EKG showed sinus rhythm, initial troponins 865.  She denies chest pain or shortness of breath today.  Denies abdominal pain.  Started on heparin drip per ACS protocol.  Started on IV antibiotics.   Past Medical History:  Diagnosis Date   Coronary artery disease    a. 07/2013 STEMI/PCI ([place]): LM nl, LAD 100p (thrombectomy & 3.5x23 Alpine DES), LCX nl, RCA nl, EF 45%.   Depression    Diastolic dysfunction    a.) TTE 11/26/2020 - LVEF 50-55%; G2DD.   Endometrial cancer ([place])    Gravida 3 para 3    Hyperlipidemia    Hypertension    Hypertensive heart disease    Hypothyroid    Ischemic cardiomyopathy    a.) 07/2013 EF 45% @ time of STEMI; b.) 11/2013  Echo: EF 55-60%, mildly dil LA, nl RV fxn. c.) TTE 07/29/2015 - LVEF 55-60%, mild LA and RV dilation. d.) TTE 11/26/2020 - LVEF 50-55%; G2DD.   Menopause    Obstructive sleep apnea on CPAP    Osteoarthritis    PONV (postoperative nausea and vomiting)    ST elevation myocardial infarction (STEMI) of anterolateral wall ([place]) 08/14/2013   a.) LVEF reduced at 45%; Tx'd with trombectomy and PCI to 100% LAD; 3.5 x 23 mm Xience Alpine DES placed.   T2DM (type 2 diabetes mellitus) ([place])    Thyroid cancer ([place])    a.) s/p total thyroidectomy. b.) on daily levothyroxine.    Past Surgical History:  Procedure Laterality Date   CATARACT EXTRACTION W/PHACO Left 08/30/2016   Procedure: CATARACT EXTRACTION PHACO AND INTRAOCULAR LENS PLACEMENT ([facility]) Left daibetic;  Surgeon: Leandrew Koyanagi, MD;  Location: [facility];  Service: Ophthalmology;  Laterality: Left;  Diabetic - insulin and oral meds sleep apnea   CATARACT EXTRACTION W/PHACO Right 10/07/2016   Procedure: CATARACT EXTRACTION PHACO AND INTRAOCULAR LENS PLACEMENT ([facility]);  Surgeon: Leandrew Koyanagi, MD;  Location: [facility];  Service: Ophthalmology;  Laterality: Right;  Lot# [id number] H [place]: 00:43.3 AP%: 12.2 CDE: 5.28   CORONARY ANGIOPLASTY WITH STENT PLACEMENT Left 08/14/2013   Procedure: CORONARY ANGIOPLASTY WITH STENT PLACEMENT (3.5 x 23 mm Xience Alpine DES to LAD); Location: [facility], [place]; Surgeon: Antony Haste  Jamal Collin, MD   GANGLION CYST EXCISION Right 1980   LAPAROSCOPIC HYSTERECTOMY     THYROIDECTOMY  1995   total   TOTAL KNEE ARTHROPLASTY Right 12/18/2020   Procedure: RIGHT TOTAL KNEE ARTHROPLASTY;  Surgeon: Thornton Park, MD;  Location: [facility];  Service: Orthopedics;  Laterality: Right;   TOTAL KNEE ARTHROPLASTY Left 12/10/2021   Procedure: TOTAL KNEE ARTHROPLASTY;  Surgeon: Thornton Park, MD;  Location: [facility];  Service: Orthopedics;  Laterality: Left;     Home Medications:  Prior to Admission  medications   Medication Sig Start Date End Date Taking? Authorizing Provider  acetaminophen (TYLENOL) 650 MG CR tablet Take 1,950 mg by mouth daily.   Yes [provider]  amLODipine (NORVASC) 2.5 MG tablet TAKE 1 TABLET BY MOUTH DAILY 06/16/21  Yes Gollan, Kathlene November, MD  aspirin EC (ASPIRIN 81) 81 MG tablet Take 81 mg by mouth daily.   Yes [provider]  bisacodyl (DULCOLAX) 5 MG EC tablet Take 2 tablets (10 mg total) by mouth daily as needed for moderate constipation. 12/11/21  Yes Thornton Park, MD  carvedilol (COREG) 3.125 MG tablet Take 3.125 mg by mouth daily.   Yes [provider]  docusate sodium (COLACE) 100 MG capsule Take 1 capsule (100 mg total) by mouth 2 (two) times daily. 12/11/21  Yes Thornton Park, MD  ezetimibe (ZETIA) 10 MG tablet TAKE 1 TABLET BY MOUTH DAILY 07/02/21  Yes Gollan, Kathlene November, MD  FLUoxetine (PROZAC) 10 MG capsule TAKE 1 CAPSULE BY MOUTH DAILY 12/07/21  Yes Karamalegos, Devonne Doughty, DO  fluticasone (FLONASE) 50 MCG/ACT nasal spray Place 2 sprays into both nostrils daily. Patient taking differently: Place 2-3 sprays into both nostrils at bedtime as needed. 02/18/21  Yes Brunetta Jeans, PA-C  levothyroxine (SYNTHROID) 125 MCG tablet TAKE 1 TABLET BY MOUTH  DAILY BEFORE BREAKFAST 09/29/21  Yes Karamalegos, Devonne Doughty, DO  losartan (COZAAR) 100 MG tablet TAKE 1 TABLET BY MOUTH DAILY 08/17/21  Yes Gollan, Kathlene November, MD  metFORMIN (GLUCOPHAGE-XR) 500 MG 24 hr tablet TAKE 3 TABLETS BY MOUTH DAILY  WITH BREAKFAST 01/08/22  Yes Karamalegos, Alexander J, DO  ondansetron (ZOFRAN-ODT) 4 MG disintegrating tablet Take 4 mg by mouth every 8 (eight) hours as needed. 01/23/22  Yes [provider]  OZEMPIC, 1 MG/DOSE, 4 MG/3ML SOPN INJECT SUBCUTANEOUSLY 1 MG  WEEKLY 01/18/22  Yes Karamalegos, Alexander J, DO  rosuvastatin (CRESTOR) 20 MG tablet TAKE 1 TABLET BY MOUTH DAILY 07/28/21  Yes Gollan, Kathlene November, MD  traMADol (ULTRAM) 50 MG tablet  Take 50 mg by mouth every 6 (six) hours as needed. 12/23/21  Yes [provider]  [place] test strip U UTD BID Patient not taking: Reported on 01/26/2022 11/10/16   Olin Hauser, DO  carvedilol (COREG) 3.125 MG tablet TAKE 1 TABLET BY MOUTH TWICE  DAILY Patient not taking: Reported on 01/27/2022 07/28/21   Minna Merritts, MD  fexofenadine (ALLEGRA) 180 MG tablet Take 180 mg by mouth daily as needed for allergies or rhinitis. Patient not taking: Reported on 11/30/2021    [provider]  nitroGLYCERIN (NITROSTAT) 0.4 MG SL tablet Place 1 tablet (0.4 mg total) under the tongue every 5 (five) minutes as needed for chest pain. 01/29/20   Minna Merritts, MD  oxyCODONE (OXY IR/ROXICODONE) 5 MG immediate release tablet Take 1-2 tablets (5-10 mg total) by mouth every 4 (four) hours as needed for moderate pain (pain score 4-6). Patient not taking: Reported on  01/26/2022 12/11/21   Thornton Park, MD    Inpatient Medications: Scheduled Meds:  insulin aspart  0-5 Units Subcutaneous QHS   insulin aspart  0-9 Units Subcutaneous TID WC   Continuous Infusions:  sodium chloride 75 mL/hr at 01/27/22 1540   heparin 1,050 Units/hr (01/27/22 1459)   vancomycin 2,000 mg (01/27/22 1536)   PRN Meds: acetaminophen, hydrALAZINE, ondansetron (ZOFRAN) IV  Allergies:   No Known Allergies  Social History:   Social History   Socioeconomic History   Marital status: Widowed    Spouse name: Not on file   Number of children: 3   Years of education: Not on file   Highest education level: Not on file  Occupational History   Occupation: retired  Tobacco Use   Smoking status: Never   Smokeless tobacco: Never  Vaping Use   Vaping Use: Never used  Substance and Sexual Activity   Alcohol use: No   Drug use: No   Sexual activity: Not on file  Other Topics Concern   Not on file  Social History Narrative   Not on file   Social Determinants of Health    Financial Resource Strain: Low Risk  (03/27/2021)   Overall Financial Resource Strain (CARDIA)    Difficulty of Paying Living Expenses: Not hard at all  Food Insecurity: No Food Insecurity (12/10/2021)   Hunger Vital Sign    Worried About Running Out of Food in the Last Year: Never true    [place] in the Last Year: Never true  Transportation Needs: No Transportation Needs (12/10/2021)   PRAPARE - Hydrologist (Medical): No    Lack of Transportation (Non-Medical): No  Physical Activity: Insufficiently Active (03/27/2021)   Exercise Vital Sign    Days of Exercise per Week: 3 days    Minutes of Exercise per Session: 30 min  Stress: No Stress Concern Present (03/27/2021)   [place]    Feeling of Stress : Only a little  Social Connections: Moderately Integrated (03/27/2021)   Social Connection and Isolation Panel [NHANES]    Frequency of Communication with Friends and Family: More than three times a week    Frequency of Social Gatherings with Friends and Family: More than three times a week    Attends Religious Services: More than 4 times per year    Active Member of [employer] or Organizations: Yes    Attends Archivist Meetings: More than 4 times per year    Marital Status: Widowed  Intimate Partner Violence: Not At Risk (12/10/2021)   Humiliation, Afraid, Rape, and Kick questionnaire    Fear of Current or Ex-Partner: No    Emotionally Abused: No    Physically Abused: No    Sexually Abused: No    Family History:    Family History  Problem Relation Age of Onset   Heart attack Father    Heart failure Father    Hypertension Father    Hyperlipidemia Father    Heart disease Father 39       CABG    Pulmonary fibrosis Son    Breast cancer Neg Hx      ROS:  Please see the history of present illness.   All other ROS reviewed and negative.     Physical Exam/Data:    Vitals:   01/27/22 1130 01/27/22 1200 01/27/22 1230 01/27/22 1300  BP: (!) 118/50 (!) 115/55 (!) 113/50 (!) 111/54  Pulse: 78 76 73 74  Resp: (!) '29 18 17 '$ (!) 22  Temp:      SpO2: 100% 98% 99% 98%  Weight:      Height:        Intake/Output Summary (Last 24 hours) at 01/27/2022 1723 Last data filed at 01/27/2022 1624 Gross per 24 hour  Intake 700 ml  Output --  Net 700 ml      01/27/2022    9:40 AM 01/26/2022    1:50 PM 12/10/2021    6:15 AM  Last 3 Weights  Weight (lbs) 223 lb 223 lb 8 oz 218 lb  Weight (kg) 101.152 kg 101.379 kg 98.884 kg     Body mass index is 35.99 kg/m.  General:  Well nourished, well developed, in no acute distress HEENT: normal Neck: no JVD Vascular: No carotid bruits; Distal pulses 2+ bilaterally Cardiac:  normal S1, S2; RRR; no murmur  Lungs:  clear to auscultation bilaterally, no wheezing, rhonchi or rales  Abd: soft, nontender, no hepatomegaly  Ext: no edema Musculoskeletal:  No deformities, BUE and BLE strength normal and equal Skin: warm and dry  Neuro:  CNs 2-12 intact, no focal abnormalities noted Psych:  Normal affect   EKG:  The EKG was personally reviewed and demonstrates: Sinus rhythm Telemetry:  Telemetry was personally reviewed and demonstrates: Sinus rhythm  Relevant CV Studies: Echo 2022 EF 50 to 55%  Laboratory Data:  High Sensitivity Troponin:   Recent Labs  Lab 01/27/22 0942  TROPONINIHS 865*     Chemistry Recent Labs  Lab 01/27/22 0942  NA 132*  K 3.6  CL 102  CO2 23  GLUCOSE 133*  BUN 45*  CREATININE 1.32*  CALCIUM 7.5*  GFRNONAA 42*  ANIONGAP 7    Recent Labs  Lab 01/27/22 0942  PROT 6.0*  ALBUMIN 2.3*  AST 35  ALT 20  ALKPHOS 115  BILITOT 0.6   Lipids No results for input(s): "CHOL", "TRIG", "HDL", "LABVLDL", "LDLCALC", "CHOLHDL" in the last 168 hours.  Hematology Recent Labs  Lab 01/27/22 0942  WBC 6.4  RBC 3.22*  HGB 9.6*  HCT 30.1*  MCV 93.5  MCH 29.8  MCHC 31.9  RDW 13.5   PLT 118*   Thyroid No results for input(s): "TSH", "FREET4" in the last 168 hours.  BNP Recent Labs  Lab 01/27/22 0952  BNP 38.4    DDimer  Recent Labs  Lab 01/27/22 1048  DDIMER 10.28*     Radiology/Studies:  US ABDOMEN LIMITED RUQ (LIVER/GB)  Result Date: 01/27/2022 CLINICAL DATA:  Right upper quadrant abdominal pain EXAM: ULTRASOUND ABDOMEN LIMITED RIGHT UPPER QUADRANT COMPARISON:  CT same day FINDINGS: Gallbladder: Gallbladder wall thickening up to 9 mm. No Murphy sign. 2.6 cm gallstone at the gallbladder neck. Findings worrisome for acute cholecystitis despite the absence of a Murphy sign. Common bile duct: Diameter: Dilated to 12 mm.  No visible ductal stone. Liver: Normal appearance of the liver parenchyma itself. Portal vein is patent on color Doppler imaging with normal direction of blood flow towards the liver. Other: None. IMPRESSION: 1. 2.6 cm gallstone at the gallbladder neck with gallbladder wall thickening. No Murphy sign. Findings worrisome for acute cholecystitis, despite the absence of a demonstrable Murphy sign. 2. Dilated common bile duct to 12 mm. No visible ductal stone. Electronically Signed   By: Nelson Chimes M.D.   On: 01/27/2022 14:41   CT Angio Chest PE W and/or Wo Contrast  Result Date: 01/27/2022 CLINICAL DATA:  Pulmonary  embolism (PE) suspected, high prob; Abdominal pain, acute, nonlocalized EXAM: CT ANGIOGRAPHY CHEST CT ABDOMEN AND PELVIS WITH CONTRAST TECHNIQUE: Multidetector CT imaging of the chest was performed using the standard protocol during bolus administration of intravenous contrast. Multiplanar CT image reconstructions and MIPs were obtained to evaluate the vascular anatomy. Multidetector CT imaging of the abdomen and pelvis was performed using the standard protocol during bolus administration of intravenous contrast. RADIATION DOSE REDUCTION: This exam was performed according to the departmental dose-optimization program which includes automated  exposure control, adjustment of the mA and/or kV according to patient size and/or use of iterative reconstruction technique. CONTRAST:  13m OMNIPAQUE IOHEXOL 350 MG/ML SOLN COMPARISON:  None Available. FINDINGS: CTA CHEST FINDINGS Cardiovascular: Satisfactory opacification of the pulmonary arteries to the segmental level. No evidence of pulmonary embolism. Pulmonary trunk measures 3.5 cm in diameter. Thoracic aorta is nonaneurysmal. Scattered atherosclerotic vascular calcifications of the aorta and coronary arteries. Heart size is upper limits of normal. No pericardial effusion. Mediastinum/Nodes: No enlarged mediastinal, hilar, or axillary lymph nodes. Thyroid gland, trachea, and esophagus demonstrate no significant findings. Lungs/Pleura: Trace layering bilateral pleural effusions. Mild bibasilar subsegmental atelectasis. No pneumothorax. Musculoskeletal: No chest wall abnormality. No acute or significant osseous findings. Degenerative thoracic spondylosis. Review of the MIP images confirms the above findings. CT ABDOMEN and PELVIS FINDINGS Hepatobiliary: 4.4 cm stone in the region of the gallbladder neck. Numerous small stones and/or sludge layering within the gallbladder fundus. Gallbladder is moderately distended with diffuse gallbladder wall thickening. No focal liver lesion is identified. There is mild intra and extrahepatic biliary ductal dilatation. Pancreas: Fatty atrophy of the pancreas. No focal pancreatic lesion. No ductal dilatation or inflammatory changes. Spleen: Normal in size without focal abnormality. Adrenals/Urinary Tract: Unremarkable adrenal glands. Kidneys enhance symmetrically without solid lesion, stone, or hydronephrosis. Ureters are nondilated. Urinary bladder appears unremarkable for the degree of distention. Stomach/Bowel: Stomach is within normal limits. Appendix appears normal. Scattered colonic diverticulosis. No evidence of bowel wall thickening, distention, or inflammatory  changes. Vascular/Lymphatic: Non-enhancement of the left portal vein suspicious for portal vein thrombosis (series 2, image 26). Main portal vein and right portal veins enhance homogeneously. Aortoiliac atherosclerosis. No abdominopelvic lymphadenopathy. Reproductive: Status post hysterectomy. No adnexal masses. Other: No free fluid. No abdominopelvic fluid collection. No pneumoperitoneum. Musculoskeletal: No acute or significant osseous findings. Degenerative lumbar spondylosis. Review of the MIP images confirms the above findings. IMPRESSION: CTA Chest: 1. No evidence of pulmonary embolism. 2. Trace layering bilateral pleural effusions with mild bibasilar subsegmental atelectasis. 3. Mildly dilated pulmonary trunk suggesting pulmonary arterial hypertension. 4. Aortic and coronary artery atherosclerosis. CT Abdomen/Pelvis: 1. Non-enhancement of the left portal vein suspicious for portal vein thrombosis. Main portal vein and right portal veins enhance homogeneously. 2. Cholelithiasis with a 4.4 cm stone in the region of the gallbladder neck. Gallbladder is moderately distended with diffuse gallbladder wall thickening and mild intra and extrahepatic biliary ductal dilatation. Findings are concerning for acute cholecystitis. 3. Colonic diverticulosis without evidence of acute diverticulitis. 4. Aortic atherosclerosis (ICD10-I70.0). Electronically Signed   By: NDavina PokeD.O.   On: 01/27/2022 13:38   CT ABDOMEN PELVIS W CONTRAST  Result Date: 01/27/2022 CLINICAL DATA:  Pulmonary embolism (PE) suspected, high prob; Abdominal pain, acute, nonlocalized EXAM: CT ANGIOGRAPHY CHEST CT ABDOMEN AND PELVIS WITH CONTRAST TECHNIQUE: Multidetector CT imaging of the chest was performed using the standard protocol during bolus administration of intravenous contrast. Multiplanar CT image reconstructions and MIPs were obtained to evaluate the vascular anatomy. Multidetector CT imaging  of the abdomen and pelvis was performed  using the standard protocol during bolus administration of intravenous contrast. RADIATION DOSE REDUCTION: This exam was performed according to the departmental dose-optimization program which includes automated exposure control, adjustment of the mA and/or kV according to patient size and/or use of iterative reconstruction technique. CONTRAST:  77m OMNIPAQUE IOHEXOL 350 MG/ML SOLN COMPARISON:  None Available. FINDINGS: CTA CHEST FINDINGS Cardiovascular: Satisfactory opacification of the pulmonary arteries to the segmental level. No evidence of pulmonary embolism. Pulmonary trunk measures 3.5 cm in diameter. Thoracic aorta is nonaneurysmal. Scattered atherosclerotic vascular calcifications of the aorta and coronary arteries. Heart size is upper limits of normal. No pericardial effusion. Mediastinum/Nodes: No enlarged mediastinal, hilar, or axillary lymph nodes. Thyroid gland, trachea, and esophagus demonstrate no significant findings. Lungs/Pleura: Trace layering bilateral pleural effusions. Mild bibasilar subsegmental atelectasis. No pneumothorax. Musculoskeletal: No chest wall abnormality. No acute or significant osseous findings. Degenerative thoracic spondylosis. Review of the MIP images confirms the above findings. CT ABDOMEN and PELVIS FINDINGS Hepatobiliary: 4.4 cm stone in the region of the gallbladder neck. Numerous small stones and/or sludge layering within the gallbladder fundus. Gallbladder is moderately distended with diffuse gallbladder wall thickening. No focal liver lesion is identified. There is mild intra and extrahepatic biliary ductal dilatation. Pancreas: Fatty atrophy of the pancreas. No focal pancreatic lesion. No ductal dilatation or inflammatory changes. Spleen: Normal in size without focal abnormality. Adrenals/Urinary Tract: Unremarkable adrenal glands. Kidneys enhance symmetrically without solid lesion, stone, or hydronephrosis. Ureters are nondilated. Urinary bladder appears  unremarkable for the degree of distention. Stomach/Bowel: Stomach is within normal limits. Appendix appears normal. Scattered colonic diverticulosis. No evidence of bowel wall thickening, distention, or inflammatory changes. Vascular/Lymphatic: Non-enhancement of the left portal vein suspicious for portal vein thrombosis (series 2, image 26). Main portal vein and right portal veins enhance homogeneously. Aortoiliac atherosclerosis. No abdominopelvic lymphadenopathy. Reproductive: Status post hysterectomy. No adnexal masses. Other: No free fluid. No abdominopelvic fluid collection. No pneumoperitoneum. Musculoskeletal: No acute or significant osseous findings. Degenerative lumbar spondylosis. Review of the MIP images confirms the above findings. IMPRESSION: CTA Chest: 1. No evidence of pulmonary embolism. 2. Trace layering bilateral pleural effusions with mild bibasilar subsegmental atelectasis. 3. Mildly dilated pulmonary trunk suggesting pulmonary arterial hypertension. 4. Aortic and coronary artery atherosclerosis. CT Abdomen/Pelvis: 1. Non-enhancement of the left portal vein suspicious for portal vein thrombosis. Main portal vein and right portal veins enhance homogeneously. 2. Cholelithiasis with a 4.4 cm stone in the region of the gallbladder neck. Gallbladder is moderately distended with diffuse gallbladder wall thickening and mild intra and extrahepatic biliary ductal dilatation. Findings are concerning for acute cholecystitis. 3. Colonic diverticulosis without evidence of acute diverticulitis. 4. Aortic atherosclerosis (ICD10-I70.0). Electronically Signed   By: NDavina PokeD.O.   On: 01/27/2022 13:38   DG Chest 2 View  Result Date: 01/27/2022 CLINICAL DATA:  Fevers EXAM: CHEST - 2 VIEW COMPARISON:  08/03/16 CXR FINDINGS: No pleural effusion. No pneumothorax. Normal cardiac and mediastinal contours. No displaced rib fractures. Degenerative changes of the bilateral glenohumeral joints, progressed on  the right compared to 2018. Vertebral body heights are maintained. Rounded densities in the upper abdomen only visualized on the lateral view could represent a combination of renal and gallstones. IMPRESSION: 1.  No active cardiopulmonary disease. 2.  Possible small left sided renal stone. Electronically Signed   By: HMarin RobertsM.D.   On: 01/27/2022 10:06     Assessment and Plan:   NSTEMI, history of CAD/PCI to LAD 2015 -  Denies chest pain, hemodynamically stable. -Get echocardiogram -Continue heparin x 48 hours -Consider Myoview tomorrow to rule out high risk ischemia if patient stays hemodynamically stable and troponin stays flat. -Left heart cath and possible PCI not ideal at the moment, due to possible infection with biliary tree as source unless patient becomes unstable or troponins become significantly elevated. -Continue PTA aspirin, Zetia, Crestor.  2.  Hypertension -Hold BP meds in the setting of infection, low BP.  3.  Fevers, gallstone,?  Portal vein thrombosis -IV antibiotics as per primary team -Cholecystostomy tube per IR being considered.  Total encounter time more than 85 minutes  Greater than 50% was spent in counseling and coordination of care with the patient   Signed, Kate Sable, MD  01/27/2022 5:23 PM

## 2022-01-27 NOTE — Consult Note (Signed)
Subjective:   CC: Acute cholecystitis  HPI:  [name] is a 76 y.o. female who is consulted by [facility] for evaluation of above cc.  Symptoms were first noted several days ago.  Reported pain per chart review but patient denies any pain since fevers have started.  Her fevers is the main complaint today.  Fevers are associated with chills and rigors.  She did also complain of some epigastric fullness recently which was relieved by taking MiraLAX.  Past Medical History:  has a past medical history of Coronary artery disease, Depression, Diastolic dysfunction, Endometrial cancer ([place]), Gravida 3 para 3, Hyperlipidemia, Hypertension, Hypertensive heart disease, Hypothyroid, Ischemic cardiomyopathy, Menopause, Obstructive sleep apnea on CPAP, Osteoarthritis, PONV (postoperative nausea and vomiting), ST elevation myocardial infarction (STEMI) of anterolateral wall ([place]) (08/14/2013), T2DM (type 2 diabetes mellitus) ([place]), and Thyroid cancer ([place]).  Past Surgical History:  has a past surgical history that includes Laparoscopic hysterectomy; Ganglion cyst excision (Right, 1980); Thyroidectomy (1995); Coronary angioplasty with stent (Left, 08/14/2013); Cataract extraction w/PHACO (Left, 08/30/2016); Cataract extraction w/PHACO (Right, 10/07/2016); Total knee arthroplasty (Right, 12/18/2020); and Total knee arthroplasty (Left, 12/10/2021).  Family History: family history includes Heart attack in her father; Heart disease (age of onset: 22) in her father; Heart failure in her father; Hyperlipidemia in her father; Hypertension in her father; Pulmonary fibrosis in her son.  Social History:  reports that she has never smoked. She has never used smokeless tobacco. She reports that she does not drink alcohol and does not use drugs.  Current Medications:  Prior to Admission medications   Medication Sig Start Date End Date Taking? Authorizing Provider  acetaminophen (TYLENOL) 650 MG CR tablet Take 1,950 mg by  mouth daily.   Yes [provider]  amLODipine (NORVASC) 2.5 MG tablet TAKE 1 TABLET BY MOUTH DAILY 06/16/21  Yes Gollan, Kathlene November, MD  aspirin EC (ASPIRIN 81) 81 MG tablet Take 81 mg by mouth daily.   Yes [provider]  bisacodyl (DULCOLAX) 5 MG EC tablet Take 2 tablets (10 mg total) by mouth daily as needed for moderate constipation. 12/11/21  Yes Thornton Park, MD  carvedilol (COREG) 3.125 MG tablet Take 3.125 mg by mouth daily.   Yes [provider]  docusate sodium (COLACE) 100 MG capsule Take 1 capsule (100 mg total) by mouth 2 (two) times daily. 12/11/21  Yes Thornton Park, MD  ezetimibe (ZETIA) 10 MG tablet TAKE 1 TABLET BY MOUTH DAILY 07/02/21  Yes Gollan, Kathlene November, MD  FLUoxetine (PROZAC) 10 MG capsule TAKE 1 CAPSULE BY MOUTH DAILY 12/07/21  Yes Karamalegos, Devonne Doughty, DO  fluticasone (FLONASE) 50 MCG/ACT nasal spray Place 2 sprays into both nostrils daily. Patient taking differently: Place 2-3 sprays into both nostrils at bedtime as needed. 02/18/21  Yes Brunetta Jeans, PA-C  levothyroxine (SYNTHROID) 125 MCG tablet TAKE 1 TABLET BY MOUTH  DAILY BEFORE BREAKFAST 09/29/21  Yes Karamalegos, Devonne Doughty, DO  losartan (COZAAR) 100 MG tablet TAKE 1 TABLET BY MOUTH DAILY 08/17/21  Yes Gollan, Kathlene November, MD  metFORMIN (GLUCOPHAGE-XR) 500 MG 24 hr tablet TAKE 3 TABLETS BY MOUTH DAILY  WITH BREAKFAST 01/08/22  Yes Karamalegos, Alexander J, DO  ondansetron (ZOFRAN-ODT) 4 MG disintegrating tablet Take 4 mg by mouth every 8 (eight) hours as needed. 01/23/22  Yes [provider]  OZEMPIC, 1 MG/DOSE, 4 MG/3ML SOPN INJECT SUBCUTANEOUSLY 1 MG  WEEKLY 01/18/22  Yes Karamalegos, Alexander J, DO  rosuvastatin (CRESTOR) 20 MG tablet TAKE 1 TABLET BY  MOUTH DAILY 07/28/21  Yes Gollan, Kathlene November, MD  traMADol (ULTRAM) 50 MG tablet Take 50 mg by mouth every 6 (six) hours as needed. 12/23/21  Yes [provider]  [place] test strip U UTD  BID Patient not taking: Reported on 01/26/2022 11/10/16   Olin Hauser, DO  carvedilol (COREG) 3.125 MG tablet TAKE 1 TABLET BY MOUTH TWICE  DAILY Patient not taking: Reported on 01/27/2022 07/28/21   Minna Merritts, MD  fexofenadine (ALLEGRA) 180 MG tablet Take 180 mg by mouth daily as needed for allergies or rhinitis. Patient not taking: Reported on 11/30/2021    [provider]  nitroGLYCERIN (NITROSTAT) 0.4 MG SL tablet Place 1 tablet (0.4 mg total) under the tongue every 5 (five) minutes as needed for chest pain. 01/29/20   Minna Merritts, MD  oxyCODONE (OXY IR/ROXICODONE) 5 MG immediate release tablet Take 1-2 tablets (5-10 mg total) by mouth every 4 (four) hours as needed for moderate pain (pain score 4-6). Patient not taking: Reported on 01/26/2022 12/11/21   Thornton Park, MD    Allergies:  Allergies as of 01/27/2022   (No Known Allergies)    ROS:  General: Denies weight loss, weight gain, fatigue, fevers, chills, and night sweats. Eyes: Denies blurry vision, double vision, eye pain, itchy eyes, and tearing. Ears: Denies hearing loss, earache, and ringing in ears. Nose: Denies sinus pain, congestion, infections, runny nose, and nosebleeds. Mouth/throat: Denies hoarseness, sore throat, bleeding gums, and difficulty swallowing. Heart: Denies chest pain, palpitations, racing heart, irregular heartbeat, leg pain or swelling, and decreased activity tolerance. Respiratory: Denies breathing difficulty, shortness of breath, wheezing, cough, and sputum. GI: Denies change in appetite, heartburn, nausea, vomiting, constipation, diarrhea, and blood in stool. GU: Denies difficulty urinating, pain with urinating, urgency, frequency, blood in urine. Musculoskeletal: Denies joint stiffness, pain, swelling, muscle weakness. Skin: Denies rash, itching, mass, tumors, sores, and boils Neurologic: Denies headache, fainting, dizziness, seizures, numbness, and  tingling. Psychiatric: Denies depression, anxiety, difficulty sleeping, and memory loss. Endocrine: Denies heat or cold intolerance, and increased thirst or urination. Blood/lymph: Denies easy bruising, and swollen glands     Objective:     BP (!) 111/54   Pulse 74   Temp 98.4 F (36.9 C) Comment: received 244m IVF with EMS and took 3 ES tylenol this AM  Resp (!) 22   Ht '5\' 6"'$  (1.676 m)   Wt 101.2 kg   SpO2 98%   BMI 35.99 kg/m    Constitutional :  alert, cooperative, appears stated age, and no distress  Lymphatics/Throat:  no asymmetry, masses, or scars  Respiratory:  clear to auscultation bilaterally  Cardiovascular:  regular rate and rhythm  Gastrointestinal: soft, non-tender; bowel sounds normal; no masses,  no organomegaly.   Musculoskeletal: Steady movement  Skin: Cool and moist  Psychiatric: Normal affect, non-agitated, not confused       LABS:     Latest Ref Rng & Units 01/27/2022    9:42 AM 12/11/2021    6:47 AM 11/30/2021   10:00 AM  CMP  Glucose 70 - 99 mg/dL 133  229  122   BUN 8 - 23 mg/dL 45  19  18   Creatinine 0.44 - 1.00 mg/dL 1.32  0.93  0.91   Sodium 135 - 145 mmol/L 132  136  137   Potassium 3.5 - 5.1 mmol/L 3.6  4.1  4.1   Chloride 98 - 111 mmol/L 102  108  105   CO2  22 - 32 mmol/L '23  21  27   '$ Calcium 8.9 - 10.3 mg/dL 7.5  8.1  9.0   Total Protein 6.5 - 8.1 g/dL 6.0     Total Bilirubin 0.3 - 1.2 mg/dL 0.6     Alkaline Phos 38 - 126 U/L 115     AST 15 - 41 U/L 35     ALT 0 - 44 U/L 20         Latest Ref Rng & Units 01/27/2022    9:42 AM 12/11/2021    6:47 AM 11/30/2021   10:00 AM  CBC  WBC 4.0 - 10.5 K/uL 6.4  9.9  5.9   Hemoglobin 12.0 - 15.0 g/dL 9.6  9.4  11.7   Hematocrit 36.0 - 46.0 % 30.1  29.4  37.6   Platelets 150 - 400 K/uL 118  178  199      RADS: CLINICAL DATA:  Right upper quadrant abdominal pain   EXAM: ULTRASOUND ABDOMEN LIMITED RIGHT UPPER QUADRANT   COMPARISON:  CT same day   FINDINGS: Gallbladder:    Gallbladder wall thickening up to 9 mm. No Murphy sign. 2.6 cm gallstone at the gallbladder neck. Findings worrisome for acute cholecystitis despite the absence of a Murphy sign.   Common bile duct:   Diameter: Dilated to 12 mm.  No visible ductal stone.   Liver:   Normal appearance of the liver parenchyma itself. Portal vein is patent on color Doppler imaging with normal direction of blood flow towards the liver.   Other: None.   IMPRESSION: 1. 2.6 cm gallstone at the gallbladder neck with gallbladder wall thickening. No Murphy sign. Findings worrisome for acute cholecystitis, despite the absence of a demonstrable Murphy sign. 2. Dilated common bile duct to 12 mm. No visible ductal stone.     Electronically Signed   By: Nelson Chimes M.D.   On: 01/27/2022 14:41   Narrative & Impression  CLINICAL DATA:  Pulmonary embolism (PE) suspected, high prob; Abdominal pain, acute, nonlocalized   EXAM: CT ANGIOGRAPHY CHEST   CT ABDOMEN AND PELVIS WITH CONTRAST   TECHNIQUE: Multidetector CT imaging of the chest was performed using the standard protocol during bolus administration of intravenous contrast. Multiplanar CT image reconstructions and MIPs were obtained to evaluate the vascular anatomy. Multidetector CT imaging of the abdomen and pelvis was performed using the standard protocol during bolus administration of intravenous contrast.   RADIATION DOSE REDUCTION: This exam was performed according to the departmental dose-optimization program which includes automated exposure control, adjustment of the mA and/or kV according to patient size and/or use of iterative reconstruction technique.   CONTRAST:  23m OMNIPAQUE IOHEXOL 350 MG/ML SOLN   COMPARISON:  None Available.   FINDINGS: CTA CHEST FINDINGS   Cardiovascular: Satisfactory opacification of the pulmonary arteries to the segmental level. No evidence of pulmonary embolism. Pulmonary trunk measures 3.5 cm in  diameter. Thoracic aorta is nonaneurysmal. Scattered atherosclerotic vascular calcifications of the aorta and coronary arteries. Heart size is upper limits of normal. No pericardial effusion.   Mediastinum/Nodes: No enlarged mediastinal, hilar, or axillary lymph nodes. Thyroid gland, trachea, and esophagus demonstrate no significant findings.   Lungs/Pleura: Trace layering bilateral pleural effusions. Mild bibasilar subsegmental atelectasis. No pneumothorax.   Musculoskeletal: No chest wall abnormality. No acute or significant osseous findings. Degenerative thoracic spondylosis.   Review of the MIP images confirms the above findings.   CT ABDOMEN and PELVIS FINDINGS   Hepatobiliary: 4.4 cm stone in the region  of the gallbladder neck. Numerous small stones and/or sludge layering within the gallbladder fundus. Gallbladder is moderately distended with diffuse gallbladder wall thickening. No focal liver lesion is identified. There is mild intra and extrahepatic biliary ductal dilatation.   Pancreas: Fatty atrophy of the pancreas. No focal pancreatic lesion. No ductal dilatation or inflammatory changes.   Spleen: Normal in size without focal abnormality.   Adrenals/Urinary Tract: Unremarkable adrenal glands. Kidneys enhance symmetrically without solid lesion, stone, or hydronephrosis. Ureters are nondilated. Urinary bladder appears unremarkable for the degree of distention.   Stomach/Bowel: Stomach is within normal limits. Appendix appears normal. Scattered colonic diverticulosis. No evidence of bowel wall thickening, distention, or inflammatory changes.   Vascular/Lymphatic: Non-enhancement of the left portal vein suspicious for portal vein thrombosis (series 2, image 26). Main portal vein and right portal veins enhance homogeneously. Aortoiliac atherosclerosis. No abdominopelvic lymphadenopathy.   Reproductive: Status post hysterectomy. No adnexal masses.   Other: No free  fluid. No abdominopelvic fluid collection. No pneumoperitoneum.   Musculoskeletal: No acute or significant osseous findings. Degenerative lumbar spondylosis.   Review of the MIP images confirms the above findings.   IMPRESSION: CTA Chest:   1. No evidence of pulmonary embolism. 2. Trace layering bilateral pleural effusions with mild bibasilar subsegmental atelectasis. 3. Mildly dilated pulmonary trunk suggesting pulmonary arterial hypertension. 4. Aortic and coronary artery atherosclerosis.   CT Abdomen/Pelvis:   1. Non-enhancement of the left portal vein suspicious for portal vein thrombosis. Main portal vein and right portal veins enhance homogeneously. 2. Cholelithiasis with a 4.4 cm stone in the region of the gallbladder neck. Gallbladder is moderately distended with diffuse gallbladder wall thickening and mild intra and extrahepatic biliary ductal dilatation. Findings are concerning for acute cholecystitis. 3. Colonic diverticulosis without evidence of acute diverticulitis. 4. Aortic atherosclerosis (ICD10-I70.0).     Electronically Signed   By: Davina Poke D.O.   On: 01/27/2022 13:38      Assessment:      Acute cholecystitis on imaging with associated fever, transient episode of epigastric fullness.  Plan:     Additional CT findings as noted above with possible portal vein thrombosis.  CBD is dilated up to 12 mm as well.  Recommend MRCP to complete work-up for possible occult choledocholithiasis which may be leading to cholangitis causing the fever.  Patient history of diabetes skin and occasionally Mast atypical right upper quadrant pain associated with cholecystitis and cholangitis.  Due to increased troponins and concern for NSTEMI, heparin drip was started per primary team.  With the current cardiac issues, patient will be too unstable to undergo any surgical intervention for her gallbladder.  Discussed case with IR and they will evaluate the patient for  possible cholecystostomy tube placement.  Discussed above recommendations with patient and she is agreeable.  She understands that the cholecystostomy tube hopefully will be a temporary measure and once her cardiac issues are stabilized we can discuss possible interval robotic assisted lap cholecystectomy.  Surgery will peripherally follow for now.  Further management per hospitalist team.  Continue n.p.o. status until confirmation of no additional procedures needed from other specialist.    labs/images/medications/previous chart entries reviewed personally and relevant changes/updates noted above.

## 2022-01-27 NOTE — Consult Note (Signed)
Consultation  Referring Provider:     ED Admit date: 11/29 Consult date: 11/29         Reason for Consultation:     Dilated CBD         HPI:   [name] is a 76 y.o. lady with history of obesity, CAD, and hypothyroidism who presented with fevers and nausea found to have dilated CBD and acute cholecystitis with large gallstone in the neck of the gallbladder. Her liver enzymes are normal. Her CT scan also shows possible clot in one of the portal veins. No history of liver disease. No alcohol use. No drug use. No abdominal pain currently. No blood thinners.  Past Medical History:  Diagnosis Date   Coronary artery disease    a. 07/2013 STEMI/PCI ([place]): LM nl, LAD 100p (thrombectomy & 3.5x23 Alpine DES), LCX nl, RCA nl, EF 45%.   Depression    Diastolic dysfunction    a.) TTE 11/26/2020 - LVEF 50-55%; G2DD.   Endometrial cancer ([place])    Gravida 3 para 3    Hyperlipidemia    Hypertension    Hypertensive heart disease    Hypothyroid    Ischemic cardiomyopathy    a.) 07/2013 EF 45% @ time of STEMI; b.) 11/2013 Echo: EF 55-60%, mildly dil LA, nl RV fxn. c.) TTE 07/29/2015 - LVEF 55-60%, mild LA and RV dilation. d.) TTE 11/26/2020 - LVEF 50-55%; G2DD.   Menopause    Obstructive sleep apnea on CPAP    Osteoarthritis    PONV (postoperative nausea and vomiting)    ST elevation myocardial infarction (STEMI) of anterolateral wall ([place]) 08/14/2013   a.) LVEF reduced at 45%; Tx'd with trombectomy and PCI to 100% LAD; 3.5 x 23 mm Xience Alpine DES placed.   T2DM (type 2 diabetes mellitus) ([place])    Thyroid cancer ([place])    a.) s/p total thyroidectomy. b.) on daily levothyroxine.    Past Surgical History:  Procedure Laterality Date   CATARACT EXTRACTION W/PHACO Left 08/30/2016   Procedure: CATARACT EXTRACTION PHACO AND INTRAOCULAR LENS PLACEMENT ([place]) Left daibetic;  Surgeon: Leandrew Koyanagi, MD;  Location: [facility];  Service: Ophthalmology;  Laterality: Left;  Diabetic -  insulin and oral meds sleep apnea   CATARACT EXTRACTION W/PHACO Right 10/07/2016   Procedure: CATARACT EXTRACTION PHACO AND INTRAOCULAR LENS PLACEMENT ([facility]);  Surgeon: Leandrew Koyanagi, MD;  Location: [facility];  Service: Ophthalmology;  Laterality: Right;  Lot# [id number] H [place]: 00:43.3 AP%: 12.2 CDE: 5.28   CORONARY ANGIOPLASTY WITH STENT PLACEMENT Left 08/14/2013   Procedure: CORONARY ANGIOPLASTY WITH STENT PLACEMENT (3.5 x 23 mm Xience Alpine DES to LAD); Location: [facility] [place]; Surgeon: Algernon Huxley, MD   GANGLION CYST EXCISION Right [place]   total   TOTAL KNEE ARTHROPLASTY Right 12/18/2020   Procedure: RIGHT TOTAL KNEE ARTHROPLASTY;  Surgeon: Thornton Park, MD;  Location: [facility];  Service: Orthopedics;  Laterality: Right;   TOTAL KNEE ARTHROPLASTY Left 12/10/2021   Procedure: TOTAL KNEE ARTHROPLASTY;  Surgeon: Thornton Park, MD;  Location: [facility];  Service: Orthopedics;  Laterality: Left;    Family History  Problem Relation Age of Onset   Heart attack Father    Heart failure Father    Hypertension Father    Hyperlipidemia Father    Heart disease Father 30       CABG    Pulmonary fibrosis Son    Breast cancer Neg Hx  Social History   Tobacco Use   Smoking status: Never   Smokeless tobacco: Never  Vaping Use   Vaping Use: Never used  Substance Use Topics   Alcohol use: No   Drug use: No    Prior to Admission medications   Medication Sig Start Date End Date Taking? Authorizing Provider  acetaminophen (TYLENOL) 650 MG CR tablet Take 1,950 mg by mouth daily.   Yes [provider]  amLODipine (NORVASC) 2.5 MG tablet TAKE 1 TABLET BY MOUTH DAILY 06/16/21  Yes Gollan, Kathlene November, MD  aspirin EC (ASPIRIN 81) 81 MG tablet Take 81 mg by mouth daily.   Yes [provider]  bisacodyl (DULCOLAX) 5 MG EC tablet Take 2 tablets (10 mg total) by mouth daily as needed for moderate  constipation. 12/11/21  Yes Thornton Park, MD  carvedilol (COREG) 3.125 MG tablet Take 3.125 mg by mouth daily.   Yes [provider]  docusate sodium (COLACE) 100 MG capsule Take 1 capsule (100 mg total) by mouth 2 (two) times daily. 12/11/21  Yes Thornton Park, MD  ezetimibe (ZETIA) 10 MG tablet TAKE 1 TABLET BY MOUTH DAILY 07/02/21  Yes Gollan, Kathlene November, MD  FLUoxetine (PROZAC) 10 MG capsule TAKE 1 CAPSULE BY MOUTH DAILY 12/07/21  Yes Karamalegos, Devonne Doughty, DO  fluticasone (FLONASE) 50 MCG/ACT nasal spray Place 2 sprays into both nostrils daily. Patient taking differently: Place 2-3 sprays into both nostrils at bedtime as needed. 02/18/21  Yes Brunetta Jeans, PA-C  levothyroxine (SYNTHROID) 125 MCG tablet TAKE 1 TABLET BY MOUTH  DAILY BEFORE BREAKFAST 09/29/21  Yes Karamalegos, Devonne Doughty, DO  losartan (COZAAR) 100 MG tablet TAKE 1 TABLET BY MOUTH DAILY 08/17/21  Yes Gollan, Kathlene November, MD  metFORMIN (GLUCOPHAGE-XR) 500 MG 24 hr tablet TAKE 3 TABLETS BY MOUTH DAILY  WITH BREAKFAST 01/08/22  Yes Karamalegos, Alexander J, DO  ondansetron (ZOFRAN-ODT) 4 MG disintegrating tablet Take 4 mg by mouth every 8 (eight) hours as needed. 01/23/22  Yes [provider]  OZEMPIC, 1 MG/DOSE, 4 MG/3ML SOPN INJECT SUBCUTANEOUSLY 1 MG  WEEKLY 01/18/22  Yes Karamalegos, Alexander J, DO  rosuvastatin (CRESTOR) 20 MG tablet TAKE 1 TABLET BY MOUTH DAILY 07/28/21  Yes Gollan, Kathlene November, MD  traMADol (ULTRAM) 50 MG tablet Take 50 mg by mouth every 6 (six) hours as needed. 12/23/21  Yes [provider]  [place] test strip U UTD BID Patient not taking: Reported on 01/26/2022 11/10/16   Olin Hauser, DO  carvedilol (COREG) 3.125 MG tablet TAKE 1 TABLET BY MOUTH TWICE  DAILY Patient not taking: Reported on 01/27/2022 07/28/21   Minna Merritts, MD  fexofenadine (ALLEGRA) 180 MG tablet Take 180 mg by mouth daily as needed for allergies or rhinitis. Patient not  taking: Reported on 11/30/2021    [provider]  nitroGLYCERIN (NITROSTAT) 0.4 MG SL tablet Place 1 tablet (0.4 mg total) under the tongue every 5 (five) minutes as needed for chest pain. 01/29/20   Minna Merritts, MD  oxyCODONE (OXY IR/ROXICODONE) 5 MG immediate release tablet Take 1-2 tablets (5-10 mg total) by mouth every 4 (four) hours as needed for moderate pain (pain score 4-6). Patient not taking: Reported on 01/26/2022 12/11/21   Thornton Park, MD    Current Facility-Administered Medications  Medication Dose Route Frequency Provider Last Rate Last Admin   0.9 %  sodium chloride infusion   Intravenous Continuous Ivor Costa, MD 75 mL/hr at 01/27/22 1540 New Bag  at 01/27/22 1540   acetaminophen (TYLENOL) tablet 650 mg  650 mg Oral Q6H PRN Ivor Costa, MD       heparin ADULT infusion 100 units/mL (25000 units/270m)  1,050 Units/hr Intravenous Continuous MAlison Murray RPH 10.5 mL/hr at 01/27/22 1459 1,050 Units/hr at 01/27/22 1459   hydrALAZINE (APRESOLINE) injection 5 mg  5 mg Intravenous Q2H PRN NIvor Costa MD       insulin aspart (novoLOG) injection 0-5 Units  0-5 Units Subcutaneous QHS NIvor Costa MD       insulin aspart (novoLOG) injection 0-9 Units  0-9 Units Subcutaneous TID WC NIvor Costa MD       metroNIDAZOLE (FLAGYL) IVPB 500 mg  500 mg Intravenous Once FVanessa Barton MD 100 mL/hr at 01/27/22 1509 500 mg at 01/27/22 1509   ondansetron (ZOFRAN) injection 4 mg  4 mg Intravenous Q8H PRN NIvor Costa MD       vancomycin ([place] Dad IVPB 2000 mg/400 mL  2,000 mg Intravenous Once MAlison Murray RPH 200 mL/hr at 01/27/22 1536 2,000 mg at 01/27/22 1536   Current Outpatient Medications  Medication Sig Dispense Refill   acetaminophen (TYLENOL) 650 MG CR tablet Take 1,950 mg by mouth daily.     amLODipine (NORVASC) 2.5 MG tablet TAKE 1 TABLET BY MOUTH DAILY 90 tablet 3   aspirin EC (ASPIRIN 81) 81 MG tablet Take 81 mg by mouth daily.     bisacodyl (DULCOLAX) 5 MG EC  tablet Take 2 tablets (10 mg total) by mouth daily as needed for moderate constipation. 30 tablet 0   carvedilol (COREG) 3.125 MG tablet Take 3.125 mg by mouth daily.     docusate sodium (COLACE) 100 MG capsule Take 1 capsule (100 mg total) by mouth 2 (two) times daily. 10 capsule 0   ezetimibe (ZETIA) 10 MG tablet TAKE 1 TABLET BY MOUTH DAILY 90 tablet 2   FLUoxetine (PROZAC) 10 MG capsule TAKE 1 CAPSULE BY MOUTH DAILY 90 capsule 3   fluticasone (FLONASE) 50 MCG/ACT nasal spray Place 2 sprays into both nostrils daily. (Patient taking differently: Place 2-3 sprays into both nostrils at bedtime as needed.) 16 g 0   levothyroxine (SYNTHROID) 125 MCG tablet TAKE 1 TABLET BY MOUTH  DAILY BEFORE BREAKFAST 90 tablet 3   losartan (COZAAR) 100 MG tablet TAKE 1 TABLET BY MOUTH DAILY 90 tablet 3   metFORMIN (GLUCOPHAGE-XR) 500 MG 24 hr tablet TAKE 3 TABLETS BY MOUTH DAILY  WITH BREAKFAST 270 tablet 0   ondansetron (ZOFRAN-ODT) 4 MG disintegrating tablet Take 4 mg by mouth every 8 (eight) hours as needed.     OZEMPIC, 1 MG/DOSE, 4 MG/3ML SOPN INJECT SUBCUTANEOUSLY 1 MG  WEEKLY 3 mL 0   rosuvastatin (CRESTOR) 20 MG tablet TAKE 1 TABLET BY MOUTH DAILY 90 tablet 3   traMADol (ULTRAM) 50 MG tablet Take 50 mg by mouth every 6 (six) hours as needed.     ACCU-CHEK AVIVA PLUS test strip U UTD BID (Patient not taking: Reported on 01/26/2022) 100 each 12   carvedilol (COREG) 3.125 MG tablet TAKE 1 TABLET BY MOUTH TWICE  DAILY (Patient not taking: Reported on 01/27/2022) 180 tablet 3   fexofenadine (ALLEGRA) 180 MG tablet Take 180 mg by mouth daily as needed for allergies or rhinitis. (Patient not taking: Reported on 11/30/2021)     nitroGLYCERIN (NITROSTAT) 0.4 MG SL tablet Place 1 tablet (0.4 mg total) under the tongue every 5 (five) minutes as needed for chest pain. 30 tablet 11  oxyCODONE (OXY IR/ROXICODONE) 5 MG immediate release tablet Take 1-2 tablets (5-10 mg total) by mouth every 4 (four) hours as needed for  moderate pain (pain score 4-6). (Patient not taking: Reported on 01/26/2022) 30 tablet 0    Allergies as of 01/27/2022   (No Known Allergies)     Review of Systems:    All systems reviewed and negative except where noted in HPI.  Review of Systems  Constitutional:  Positive for fever. Negative for chills.  Respiratory:  Negative for shortness of breath.   Cardiovascular:  Negative for chest pain.  Gastrointestinal:  Positive for nausea. Negative for abdominal pain, blood in stool, constipation, diarrhea and melena.  Musculoskeletal:  Positive for joint pain.  Skin:  Negative for rash.  Neurological:  Negative for focal weakness.  All other systems reviewed and are negative.      Physical Exam:  Vital signs in last 24 hours: Temp:  [98.4 F (36.9 C)] 98.4 F (36.9 C) (11/29 0939) Pulse Rate:  [73-87] 74 (11/29 1300) Resp:  [17-29] 22 (11/29 1300) BP: (111-118)/(50-65) 111/54 (11/29 1300) SpO2:  [98 %-100 %] 98 % (11/29 1300) Weight:  [101.2 kg] 101.2 kg (11/29 0940)   General:   Pleasant in NAD Head:  Normocephalic and atraumatic. Eyes:   No icterus.   Conjunctiva pink. Mouth: Mucosa pink moist, no lesions. Neck:  Supple; no masses felt Lungs:  No respiratory distress Abdomen:   Flat, soft, nondistended, nontender. Msk:  MAEW x4, No clubbing or cyanosis. Strength 5/5. Symmetrical without gross deformities. Neurologic:  Alert and  oriented x4;  Cranial nerves II-XII intact.  Skin:  Warm, dry, pink without significant lesions or rashes. Psych:  Alert and cooperative. Normal affect.  LAB RESULTS: Recent Labs    01/27/22 0942  WBC 6.4  HGB 9.6*  HCT 30.1*  PLT 118*   BMET Recent Labs    01/27/22 0942  NA 132*  K 3.6  CL 102  CO2 23  GLUCOSE 133*  BUN 45*  CREATININE 1.32*  CALCIUM 7.5*   LFT Recent Labs    01/27/22 0942  PROT 6.0*  ALBUMIN 2.3*  AST 35  ALT 20  ALKPHOS 115  BILITOT 0.6   PT/INR Recent Labs    01/27/22 1446  LABPROT 14.2   INR 1.1    STUDIES: US ABDOMEN LIMITED RUQ (LIVER/GB)  Result Date: 01/27/2022 CLINICAL DATA:  Right upper quadrant abdominal pain EXAM: ULTRASOUND ABDOMEN LIMITED RIGHT UPPER QUADRANT COMPARISON:  CT same day FINDINGS: Gallbladder: Gallbladder wall thickening up to 9 mm. No Murphy sign. 2.6 cm gallstone at the gallbladder neck. Findings worrisome for acute cholecystitis despite the absence of a Murphy sign. Common bile duct: Diameter: Dilated to 12 mm.  No visible ductal stone. Liver: Normal appearance of the liver parenchyma itself. Portal vein is patent on color Doppler imaging with normal direction of blood flow towards the liver. Other: None. IMPRESSION: 1. 2.6 cm gallstone at the gallbladder neck with gallbladder wall thickening. No Murphy sign. Findings worrisome for acute cholecystitis, despite the absence of a demonstrable Murphy sign. 2. Dilated common bile duct to 12 mm. No visible ductal stone. Electronically Signed   By: Nelson Chimes M.D.   On: 01/27/2022 14:41   CT Angio Chest PE W and/or Wo Contrast  Result Date: 01/27/2022 CLINICAL DATA:  Pulmonary embolism (PE) suspected, high prob; Abdominal pain, acute, nonlocalized EXAM: CT ANGIOGRAPHY CHEST CT ABDOMEN AND PELVIS WITH CONTRAST TECHNIQUE: Multidetector CT imaging of the chest was  performed using the standard protocol during bolus administration of intravenous contrast. Multiplanar CT image reconstructions and MIPs were obtained to evaluate the vascular anatomy. Multidetector CT imaging of the abdomen and pelvis was performed using the standard protocol during bolus administration of intravenous contrast. RADIATION DOSE REDUCTION: This exam was performed according to the departmental dose-optimization program which includes automated exposure control, adjustment of the mA and/or kV according to patient size and/or use of iterative reconstruction technique. CONTRAST:  62m OMNIPAQUE IOHEXOL 350 MG/ML SOLN COMPARISON:  None Available.  FINDINGS: CTA CHEST FINDINGS Cardiovascular: Satisfactory opacification of the pulmonary arteries to the segmental level. No evidence of pulmonary embolism. Pulmonary trunk measures 3.5 cm in diameter. Thoracic aorta is nonaneurysmal. Scattered atherosclerotic vascular calcifications of the aorta and coronary arteries. Heart size is upper limits of normal. No pericardial effusion. Mediastinum/Nodes: No enlarged mediastinal, hilar, or axillary lymph nodes. Thyroid gland, trachea, and esophagus demonstrate no significant findings. Lungs/Pleura: Trace layering bilateral pleural effusions. Mild bibasilar subsegmental atelectasis. No pneumothorax. Musculoskeletal: No chest wall abnormality. No acute or significant osseous findings. Degenerative thoracic spondylosis. Review of the MIP images confirms the above findings. CT ABDOMEN and PELVIS FINDINGS Hepatobiliary: 4.4 cm stone in the region of the gallbladder neck. Numerous small stones and/or sludge layering within the gallbladder fundus. Gallbladder is moderately distended with diffuse gallbladder wall thickening. No focal liver lesion is identified. There is mild intra and extrahepatic biliary ductal dilatation. Pancreas: Fatty atrophy of the pancreas. No focal pancreatic lesion. No ductal dilatation or inflammatory changes. Spleen: Normal in size without focal abnormality. Adrenals/Urinary Tract: Unremarkable adrenal glands. Kidneys enhance symmetrically without solid lesion, stone, or hydronephrosis. Ureters are nondilated. Urinary bladder appears unremarkable for the degree of distention. Stomach/Bowel: Stomach is within normal limits. Appendix appears normal. Scattered colonic diverticulosis. No evidence of bowel wall thickening, distention, or inflammatory changes. Vascular/Lymphatic: Non-enhancement of the left portal vein suspicious for portal vein thrombosis (series 2, image 26). Main portal vein and right portal veins enhance homogeneously. Aortoiliac  atherosclerosis. No abdominopelvic lymphadenopathy. Reproductive: Status post hysterectomy. No adnexal masses. Other: No free fluid. No abdominopelvic fluid collection. No pneumoperitoneum. Musculoskeletal: No acute or significant osseous findings. Degenerative lumbar spondylosis. Review of the MIP images confirms the above findings. IMPRESSION: CTA Chest: 1. No evidence of pulmonary embolism. 2. Trace layering bilateral pleural effusions with mild bibasilar subsegmental atelectasis. 3. Mildly dilated pulmonary trunk suggesting pulmonary arterial hypertension. 4. Aortic and coronary artery atherosclerosis. CT Abdomen/Pelvis: 1. Non-enhancement of the left portal vein suspicious for portal vein thrombosis. Main portal vein and right portal veins enhance homogeneously. 2. Cholelithiasis with a 4.4 cm stone in the region of the gallbladder neck. Gallbladder is moderately distended with diffuse gallbladder wall thickening and mild intra and extrahepatic biliary ductal dilatation. Findings are concerning for acute cholecystitis. 3. Colonic diverticulosis without evidence of acute diverticulitis. 4. Aortic atherosclerosis (ICD10-I70.0). Electronically Signed   By: NDavina PokeD.O.   On: 01/27/2022 13:38   CT ABDOMEN PELVIS W CONTRAST  Result Date: 01/27/2022 CLINICAL DATA:  Pulmonary embolism (PE) suspected, high prob; Abdominal pain, acute, nonlocalized EXAM: CT ANGIOGRAPHY CHEST CT ABDOMEN AND PELVIS WITH CONTRAST TECHNIQUE: Multidetector CT imaging of the chest was performed using the standard protocol during bolus administration of intravenous contrast. Multiplanar CT image reconstructions and MIPs were obtained to evaluate the vascular anatomy. Multidetector CT imaging of the abdomen and pelvis was performed using the standard protocol during bolus administration of intravenous contrast. RADIATION DOSE REDUCTION: This exam was performed according to the  departmental dose-optimization program which includes  automated exposure control, adjustment of the mA and/or kV according to patient size and/or use of iterative reconstruction technique. CONTRAST:  86m OMNIPAQUE IOHEXOL 350 MG/ML SOLN COMPARISON:  None Available. FINDINGS: CTA CHEST FINDINGS Cardiovascular: Satisfactory opacification of the pulmonary arteries to the segmental level. No evidence of pulmonary embolism. Pulmonary trunk measures 3.5 cm in diameter. Thoracic aorta is nonaneurysmal. Scattered atherosclerotic vascular calcifications of the aorta and coronary arteries. Heart size is upper limits of normal. No pericardial effusion. Mediastinum/Nodes: No enlarged mediastinal, hilar, or axillary lymph nodes. Thyroid gland, trachea, and esophagus demonstrate no significant findings. Lungs/Pleura: Trace layering bilateral pleural effusions. Mild bibasilar subsegmental atelectasis. No pneumothorax. Musculoskeletal: No chest wall abnormality. No acute or significant osseous findings. Degenerative thoracic spondylosis. Review of the MIP images confirms the above findings. CT ABDOMEN and PELVIS FINDINGS Hepatobiliary: 4.4 cm stone in the region of the gallbladder neck. Numerous small stones and/or sludge layering within the gallbladder fundus. Gallbladder is moderately distended with diffuse gallbladder wall thickening. No focal liver lesion is identified. There is mild intra and extrahepatic biliary ductal dilatation. Pancreas: Fatty atrophy of the pancreas. No focal pancreatic lesion. No ductal dilatation or inflammatory changes. Spleen: Normal in size without focal abnormality. Adrenals/Urinary Tract: Unremarkable adrenal glands. Kidneys enhance symmetrically without solid lesion, stone, or hydronephrosis. Ureters are nondilated. Urinary bladder appears unremarkable for the degree of distention. Stomach/Bowel: Stomach is within normal limits. Appendix appears normal. Scattered colonic diverticulosis. No evidence of bowel wall thickening, distention, or  inflammatory changes. Vascular/Lymphatic: Non-enhancement of the left portal vein suspicious for portal vein thrombosis (series 2, image 26). Main portal vein and right portal veins enhance homogeneously. Aortoiliac atherosclerosis. No abdominopelvic lymphadenopathy. Reproductive: Status post hysterectomy. No adnexal masses. Other: No free fluid. No abdominopelvic fluid collection. No pneumoperitoneum. Musculoskeletal: No acute or significant osseous findings. Degenerative lumbar spondylosis. Review of the MIP images confirms the above findings. IMPRESSION: CTA Chest: 1. No evidence of pulmonary embolism. 2. Trace layering bilateral pleural effusions with mild bibasilar subsegmental atelectasis. 3. Mildly dilated pulmonary trunk suggesting pulmonary arterial hypertension. 4. Aortic and coronary artery atherosclerosis. CT Abdomen/Pelvis: 1. Non-enhancement of the left portal vein suspicious for portal vein thrombosis. Main portal vein and right portal veins enhance homogeneously. 2. Cholelithiasis with a 4.4 cm stone in the region of the gallbladder neck. Gallbladder is moderately distended with diffuse gallbladder wall thickening and mild intra and extrahepatic biliary ductal dilatation. Findings are concerning for acute cholecystitis. 3. Colonic diverticulosis without evidence of acute diverticulitis. 4. Aortic atherosclerosis (ICD10-I70.0). Electronically Signed   By: NDavina PokeD.O.   On: 01/27/2022 13:38   DG Chest 2 View  Result Date: 01/27/2022 CLINICAL DATA:  Fevers EXAM: CHEST - 2 VIEW COMPARISON:  08/03/16 CXR FINDINGS: No pleural effusion. No pneumothorax. Normal cardiac and mediastinal contours. No displaced rib fractures. Degenerative changes of the bilateral glenohumeral joints, progressed on the right compared to 2018. Vertebral body heights are maintained. Rounded densities in the upper abdomen only visualized on the lateral view could represent a combination of renal and gallstones.  IMPRESSION: 1.  No active cardiopulmonary disease. 2.  Possible small left sided renal stone. Electronically Signed   By: HMarin RobertsM.D.   On: 01/27/2022 10:06       Impression / Plan:   76y/o history of obesity, CAD, and hypothyroidism who presented with fevers and nausea found to have dilated CBD and acute cholecystitis with large gallstone in the neck of the gallbladder. No  biochemical evidence of biliary obstruction given normal liver enzymes. Some concern for possible advanced fibrosis given low albumin and clot in portal vein although this could also be due to gallbladder inflammation. The dilation could be due to Mirizzi syndrome but not biochemical evidence of obstruction  - agree with surgical consult - can perform MRCP to rule out any ampullary lesion causing dilation - will need GI f/u as an outpatient to evaluate for advanced fibrosis - rest of management per primary team  Please call with any questions or concerns.  Raylene Miyamoto MD, MPH [place]

## 2022-01-27 NOTE — Consult Note (Signed)
CODE SEPSIS - PHARMACY COMMUNICATION  **Broad Spectrum Antibiotics should be administered within 1 hour of Sepsis diagnosis**  Time Code Sepsis Called/Page Received: 5525  Antibiotics Ordered: vancomycin and cefepime  Time of 1st antibiotic administration: 1441  Additional action taken by pharmacy: N/A     Alison Murray ,PharmD Clinical Pharmacist  01/27/2022  2:10 PM

## 2022-01-27 NOTE — ED Notes (Signed)
Pt having an episode of shaking, crying and generalized pain. Pt states it came on all of a sudden and it is similar to what was happening when they called 911. Pt temp checked and was 98.7. Pt provided with balnkets. MD made aware and medications ordered. Pt medicated with 0.'5mg'$  Ativan as well as '2mg'$  morphine.

## 2022-01-28 ENCOUNTER — Inpatient Hospital Stay: Payer: Medicare Other

## 2022-01-28 ENCOUNTER — Inpatient Hospital Stay
Admit: 2022-01-28 | Discharge: 2022-01-28 | Disposition: A | Discharge status: Home / Self Care | Payer: Medicare Other | Attending: Cardiology | Admitting: Cardiology

## 2022-01-28 ENCOUNTER — Inpatient Hospital Stay: Payer: Medicare Other | Admitting: Radiology

## 2022-01-28 ENCOUNTER — Inpatient Hospital Stay (HOSPITAL_COMMUNITY)
Admit: 2022-01-28 | Discharge: 2022-01-28 | Disposition: A | Discharge status: Home / Self Care | Payer: Medicare Other | Attending: Cardiology | Admitting: Cardiology

## 2022-01-28 ENCOUNTER — Ambulatory Visit: Payer: Medicare Other | Admitting: Family Medicine

## 2022-01-28 DIAGNOSIS — N1831 Chronic kidney disease, stage 3a: Secondary | ICD-10-CM | POA: Diagnosis not present

## 2022-01-28 DIAGNOSIS — R509 Fever, unspecified: Secondary | ICD-10-CM

## 2022-01-28 DIAGNOSIS — K819 Cholecystitis, unspecified: Secondary | ICD-10-CM | POA: Diagnosis not present

## 2022-01-28 DIAGNOSIS — I82442 Acute embolism and thrombosis of left tibial vein: Secondary | ICD-10-CM

## 2022-01-28 DIAGNOSIS — E1121 Type 2 diabetes mellitus with diabetic nephropathy: Secondary | ICD-10-CM

## 2022-01-28 DIAGNOSIS — I214 Non-ST elevation (NSTEMI) myocardial infarction: Secondary | ICD-10-CM | POA: Diagnosis not present

## 2022-01-28 HISTORY — PX: IR PERC CHOLECYSTOSTOMY: IMG2326

## 2022-01-28 LAB — BLOOD CULTURE ID PANEL (REFLEXED) - BCID2

## 2022-01-28 LAB — PROCALCITONIN: Procalcitonin: 24.58 ng/mL

## 2022-01-28 LAB — GLUCOSE, CAPILLARY
Glucose-Capillary: 98 mg/dL (ref 70–99)
Glucose-Capillary: 93 mg/dL (ref 70–99)
Glucose-Capillary: 75 mg/dL (ref 70–99)
Glucose-Capillary: 158 mg/dL — ABNORMAL HIGH (ref 70–99)
Glucose-Capillary: 216 mg/dL — ABNORMAL HIGH (ref 70–99)

## 2022-01-28 LAB — URINE CULTURE: Culture: NO GROWTH

## 2022-01-28 LAB — BASIC METABOLIC PANEL
Anion gap: 9 (ref 5–15)
BUN: 35 mg/dL — ABNORMAL HIGH (ref 8–23)
CO2: 20 mmol/L — ABNORMAL LOW (ref 22–32)
Calcium: 7.5 mg/dL — ABNORMAL LOW (ref 8.9–10.3)
Chloride: 104 mmol/L (ref 98–111)
Creatinine, Ser: 1.21 mg/dL — ABNORMAL HIGH (ref 0.44–1.00)
GFR, Estimated: 46 mL/min — ABNORMAL LOW (ref >60)
Glucose, Bld: 113 mg/dL — ABNORMAL HIGH (ref 70–99)
Potassium: 3.7 mmol/L (ref 3.5–5.1)
Sodium: 133 mmol/L — ABNORMAL LOW (ref 135–145)

## 2022-01-28 LAB — ECHOCARDIOGRAM COMPLETE
AR max vel: 2.59 cm2
AV Area VTI: 2.49 cm2
AV Area mean vel: 2.28 cm2
AV Mean grad: 8 mmHg
AV Peak grad: 12.3 mmHg
Ao pk vel: 1.75 m/s
Area-P 1/2: 4.77 cm2
Height: 66 in
S' Lateral: 2.8 cm
Weight: 3474.45 oz

## 2022-01-28 LAB — CBC
HCT: 28.8 % — ABNORMAL LOW (ref 36.0–46.0)
Hemoglobin: 9.3 g/dL — ABNORMAL LOW (ref 12.0–15.0)
MCH: 29.4 pg (ref 26.0–34.0)
MCHC: 32.3 g/dL (ref 30.0–36.0)
MCV: 91.1 fL (ref 80.0–100.0)
Platelets: 112 10*3/uL — ABNORMAL LOW (ref 150–400)
RBC: 3.16 MIL/uL — ABNORMAL LOW (ref 3.87–5.11)
RDW: 13.9 % (ref 11.5–15.5)
WBC: 16.4 10*3/uL — ABNORMAL HIGH (ref 4.0–10.5)
nRBC: 0 % (ref 0.0–0.2)

## 2022-01-28 LAB — HEPARIN LEVEL (UNFRACTIONATED): Heparin Unfractionated: 0.28 IU/mL — ABNORMAL LOW (ref 0.30–0.70)

## 2022-01-28 LAB — HEMOGLOBIN A1C
Hgb A1c MFr Bld: 6.8 % — ABNORMAL HIGH (ref 4.8–5.6)
Mean Plasma Glucose: 148 mg/dL

## 2022-01-28 LAB — TROPONIN I (HIGH SENSITIVITY): Troponin I (High Sensitivity): 3344 ng/L (ref <18)

## 2022-01-28 MED ORDER — LIDOCAINE HCL 1 % IJ SOLN
INTRAMUSCULAR | Status: AC
  Administered 2022-01-28: 6 mL
  Filled 2022-01-28: qty 20

## 2022-01-28 MED ORDER — HEPARIN (PORCINE) 25000 UT/250ML-% IV SOLN
1550.0000 [IU]/h | INTRAVENOUS | Status: AC
  Administered 2022-01-29: 1350 [IU]/h via INTRAVENOUS
  Administered 2022-01-29 – 2022-01-30 (×2): 1550 [IU]/h via INTRAVENOUS
  Filled 2022-01-28 (×4): qty 250

## 2022-01-28 MED ORDER — OXYCODONE-ACETAMINOPHEN 5-325 MG PO TABS
1.0000 | ORAL_TABLET | ORAL | Status: DC | PRN
  Administered 2022-01-28 – 2022-01-29 (×4): 2 via ORAL
  Filled 2022-01-28 (×4): qty 2

## 2022-01-28 MED ORDER — HEPARIN BOLUS VIA INFUSION
1200.0000 [IU] | Freq: Once | INTRAVENOUS | Status: AC
  Administered 2022-01-28: 1200 [IU] via INTRAVENOUS
  Filled 2022-01-28: qty 1200

## 2022-01-28 MED ORDER — FENTANYL CITRATE (PF) 100 MCG/2ML IJ SOLN
INTRAMUSCULAR | Status: AC
  Filled 2022-01-28: qty 2

## 2022-01-28 MED ORDER — FENTANYL CITRATE (PF) 100 MCG/2ML IJ SOLN
INTRAMUSCULAR | Status: AC | PRN
  Administered 2022-01-28: 25 ug via INTRAVENOUS

## 2022-01-28 MED ORDER — MIDAZOLAM HCL 2 MG/2ML IJ SOLN
INTRAMUSCULAR | Status: AC
  Filled 2022-01-28: qty 2

## 2022-01-28 MED ORDER — PERFLUTREN LIPID MICROSPHERE
1.0000 mL | INTRAVENOUS | Status: AC | PRN
  Administered 2022-01-28: 4 mL via INTRAVENOUS

## 2022-01-28 MED ORDER — HEPARIN BOLUS VIA INFUSION
1250.0000 [IU] | Freq: Once | INTRAVENOUS | Status: AC
  Administered 2022-01-28: 1250 [IU] via INTRAVENOUS
  Filled 2022-01-28: qty 1250

## 2022-01-28 MED ORDER — MIDAZOLAM HCL 5 MG/5ML IJ SOLN
INTRAMUSCULAR | Status: AC | PRN
  Administered 2022-01-28: .5 mg via INTRAVENOUS

## 2022-01-28 MED ORDER — SODIUM CHLORIDE 0.9% FLUSH
5.0000 mL | Freq: Three times a day (TID) | INTRAVENOUS | Status: DC
  Administered 2022-01-28 – 2022-02-06 (×25): 5 mL

## 2022-01-28 MED ORDER — DIPHENHYDRAMINE HCL 12.5 MG/5ML PO ELIX
12.5000 mg | ORAL_SOLUTION | Freq: Once | ORAL | Status: AC
  Administered 2022-01-29: 12.5 mg via ORAL
  Filled 2022-01-28: qty 5

## 2022-01-28 MED ORDER — IOHEXOL 300 MG/ML  SOLN
8.0000 mL | Freq: Once | INTRAMUSCULAR | Status: AC | PRN
  Administered 2022-01-28: 8 mL

## 2022-01-28 NOTE — Progress Notes (Signed)
ANTICOAGULATION CONSULT NOTE  Pharmacy Consult for heparin Indication: ACS/NSTEMI  No Known Allergies  Patient Measurements: Height: '5\' 6"'$  (167.6 cm) Weight: 101.2 kg (223 lb) IBW/kg (Calculated) : 59.3 Heparin Dosing Weight: 82.2 kg  Vital Signs: BP: 112/53 (11/29 2230) Pulse Rate: 100 (11/29 2230)  Labs: Recent Labs    01/27/22 0942 01/27/22 1446 01/27/22 1450 01/27/22 1909 01/27/22 2242  HGB 9.6*  --   --   --   --   HCT 30.1*  --   --   --   --   PLT 118*  --   --   --   --   APTT  --  42*  --   --   --   LABPROT  --  14.2  --   --   --   INR  --  1.1  --   --   --   HEPARINUNFRC  --   --   --   --  0.25*  CREATININE 1.32*  --   --   --   --   TROPONINIHS 865*  --  1,003* 3,514* 5,864*     Estimated Creatinine Clearance: 43.6 mL/min (A) (by C-G formula based on SCr of 1.32 mg/dL (H)).   Medical History: Past Medical History:  Diagnosis Date   Coronary artery disease    a. 07/2013 STEMI/PCI ([place]): LM nl, LAD 100p (thrombectomy & 3.5x23 Alpine DES), LCX nl, RCA nl, EF 45%.   Depression    Diastolic dysfunction    a.) TTE 11/26/2020 - LVEF 50-55%; G2DD.   Endometrial cancer ([place])    Gravida 3 para 3    Hyperlipidemia    Hypertension    Hypertensive heart disease    Hypothyroid    Ischemic cardiomyopathy    a.) 07/2013 EF 45% @ time of STEMI; b.) 11/2013 Echo: EF 55-60%, mildly dil LA, nl RV fxn. c.) TTE 07/29/2015 - LVEF 55-60%, mild LA and RV dilation. d.) TTE 11/26/2020 - LVEF 50-55%; G2DD.   Menopause    Obstructive sleep apnea on CPAP    Osteoarthritis    PONV (postoperative nausea and vomiting)    ST elevation myocardial infarction (STEMI) of anterolateral wall ([place]) 08/14/2013   a.) LVEF reduced at 45%; Tx'd with trombectomy and PCI to 100% LAD; 3.5 x 23 mm Xience Alpine DES placed.   T2DM (type 2 diabetes mellitus) ([place])    Thyroid cancer ([place])    a.) s/p total thyroidectomy. b.) on daily levothyroxine.    Medications:  No AC meds  PTA  Assessment: Pharmacy consulted to dose heparin for 76 yo F presenting with fever, SOB and found to have elevated troponin I and D-dimer.  Goal of Therapy:  Heparin level 0.3-0.7 units/ml Monitor platelets by anticoagulation protocol: Yes   11/29 2242 HL 0.25, subtherapeutic  Plan:  Bolus 1200 units x 1 Increase heparin infusion to 1200 units/hr Recheck HL in 8 hr after rate change CBC daily while on heparin  Renda Rolls, PharmD, [facility] 01/28/2022 12:17 AM

## 2022-01-28 NOTE — Care Plan (Signed)
MRCP negative for CBD stone (significant motion artifact noted). LFT's before test were normal so pre-test probability was low. Gallbladder drain placed with IR. Will need outpatient GI f/u for possible cirrhosis. We will sign-off at this time, please call with any questions or concerns.  Raylene Miyamoto MD, MPH [place]

## 2022-01-28 NOTE — Progress Notes (Signed)
Rounding Note    Patient Name: [name] Date of Encounter: 01/28/2022  [place] Cardiologist: Ida Rogue, MD   Subjective   Discussed recent events, MRCP negative for common bile duct stone, gallbladder drain placed by IR Had significant pain, unable to perform stress test today which is currently scheduled for tomorrow Family at the bedside On Zosyn, heparin infusion  Inpatient Medications    Scheduled Meds:  aspirin EC  81 mg Oral Daily   ezetimibe  10 mg Oral Daily   fentaNYL       FLUoxetine  10 mg Oral Daily   insulin aspart  0-5 Units Subcutaneous QHS   insulin aspart  0-9 Units Subcutaneous TID WC   levothyroxine  125 mcg Oral Q0600   midazolam       rosuvastatin  20 mg Oral Daily   sodium chloride flush  5 mL Intracatheter Q8H   Continuous Infusions:  sodium chloride Stopped (01/28/22 1405)   heparin 1,200 Units/hr (01/28/22 1627)   piperacillin-tazobactam (ZOSYN)  IV 12.5 mL/hr at 01/28/22 1627   PRN Meds: acetaminophen, fentaNYL, hydrALAZINE, midazolam, morphine injection, nitroGLYCERIN, ondansetron (ZOFRAN) IV, oxyCODONE-acetaminophen   Vital Signs    Vitals:   01/28/22 1030 01/28/22 1045 01/28/22 1125 01/28/22 1609  BP: (!) 104/53 (!) 106/44 (!) 112/47 (!) 105/50  Pulse: 74 74 75 89  Resp: '18 17 17 16  '$ Temp:   98 F (36.7 C) 99 F (37.2 C)  TempSrc:   Oral   SpO2: 100% 100% 100% 98%  Weight:      Height:        Intake/Output Summary (Last 24 hours) at 01/28/2022 1709 Last data filed at 01/28/2022 1627 Gross per 24 hour  Intake 2189.67 ml  Output --  Net 2189.67 ml      01/28/2022    9:16 AM 01/28/2022    3:45 AM 01/27/2022    9:40 AM  Last 3 Weights  Weight (lbs) 217 lb 2.5 oz 217 lb 2.5 oz 223 lb  Weight (kg) 98.5 kg 98.5 kg 101.152 kg      Telemetry    Normal sinus rhythm- Personally Reviewed  ECG     - Personally Reviewed  Physical Exam   GEN: No acute distress.   Neck: No JVD Cardiac:  RRR, no murmurs, rubs, or gallops.  Respiratory: Clear to auscultation bilaterally. GI: Soft, nontender, non-distended ,drain in place MS: No edema; No deformity. Neuro:  Nonfocal  Psych: Normal affect   Labs    High Sensitivity Troponin:   Recent Labs  Lab 01/27/22 0942 01/27/22 1450 01/27/22 1909 01/27/22 2242 01/28/22 0322  TROPONINIHS 865* 1,003* 3,514* 5,864* 3,344*     Chemistry Recent Labs  Lab 01/27/22 0942 01/28/22 0322  NA 132* 133*  K 3.6 3.7  CL 102 104  CO2 23 20*  GLUCOSE 133* 113*  BUN 45* 35*  CREATININE 1.32* 1.21*  CALCIUM 7.5* 7.5*  PROT 6.0*  --   ALBUMIN 2.3*  --   AST 35  --   ALT 20  --   ALKPHOS 115  --   BILITOT 0.6  --   GFRNONAA 42* 46*  ANIONGAP 7 9    Lipids  Recent Labs  Lab 01/27/22 1450  CHOL 51  TRIG 177*  HDL <10*  LDLCALC UNABLE TO CALCULATE  CHOLHDL UNABLE TO CALCULATE    Hematology Recent Labs  Lab 01/27/22 0942 01/28/22 0322  WBC 6.4 16.4*  RBC 3.22* 3.16*  HGB  9.6* 9.3*  HCT 30.1* 28.8*  MCV 93.5 91.1  MCH 29.8 29.4  MCHC 31.9 32.3  RDW 13.5 13.9  PLT 118* 112*   Thyroid No results for input(s): "TSH", "FREET4" in the last 168 hours.  BNP Recent Labs  Lab 01/27/22 0952  BNP 38.4    DDimer  Recent Labs  Lab 01/27/22 1048  DDIMER 10.28*     Radiology    IR Perc Cholecystostomy  Result Date: 01/28/2022 INDICATION: 76 year old female with a history of EXAM: CHOLECYSTOSTOMY MEDICATIONS: None ANESTHESIA/SEDATION: Moderate (conscious) sedation was employed during this procedure. A total of Versed 0.5 mg and Fentanyl 25 mcg was administered intravenously. Moderate Sedation Time: 12 minutes. The patient's level of consciousness and vital signs were monitored continuously by radiology nursing throughout the procedure under my direct supervision. FLUOROSCOPY TIME:  Fluoroscopy Time: 0 minutes 42 seconds (16 mGy). COMPLICATIONS: None PROCEDURE: Informed written consent was obtained from the patient and the  patient's family after a thorough discussion of the procedural risks, benefits and alternatives. All questions were addressed. Maximal Sterile Barrier Technique was utilized including caps, mask, sterile gowns, sterile gloves, sterile drape, hand hygiene and skin antiseptic. A timeout was performed prior to the initiation of the procedure. Ultrasound survey of the right upper quadrant was performed for planning purposes. Once the patient is prepped and draped in the usual sterile fashion, the skin and subcutaneous tissues overlying the gallbladder were generously infiltrated 1% lidocaine for local anesthesia. A coaxial needle was advanced under ultrasound guidance through the skin subcutaneous tissues and a small segment of liver into the gallbladder lumen. With removal of the stylet, spontaneous dark bile drainage occurred. Using modified Seldinger technique, a 10 French drain was placed into the gallbladder fossa, with aspiration of the sample for the lab. Contrast injection confirmed position of the tube within the gallbladder lumen. Drainage catheter was attached to gravity drain with a suture retention placed. Patient tolerated the procedure well and remained hemodynamically stable throughout. No complications were encountered and no significant blood loss encountered. IMPRESSION: Status post percutaneous cholecystostomy Signed, Dulcy Fanny. Nadene Rubins, RPVI [facility] Electronically Signed   By: Corrie Mckusick D.O.   On: 01/28/2022 16:35   MR ABDOMEN MRCP W WO CONTAST  Result Date: 01/27/2022 CLINICAL DATA:  Right upper quadrant abdominal pain EXAM: MRI ABDOMEN WITHOUT AND WITH CONTRAST (INCLUDING MRCP) TECHNIQUE: Multiplanar multisequence MR imaging of the abdomen was performed both before and after the administration of intravenous contrast. Heavily T2-weighted images of the biliary and pancreatic ducts were obtained, and three-dimensional  MRCP images were rendered by post processing. CONTRAST:  22m GADAVIST GADOBUTROL 1 MMOL/ML IV SOLN COMPARISON:  CT scan 01/27/2022 FINDINGS: Despite efforts by the technologist and patient, motion artifact is present on today's exam and could not be eliminated. This reduces exam sensitivity and specificity. Lower chest: Unremarkable Hepatobiliary: 0.2 cm gallstone in the gallbladder with sludge and additional small gallstones noted. There is gallbladder wall thickening as well as accentuated arterial phase enhancement in the hepatic parenchyma adjacent to the gallbladder which can be associated with cholecystitis. Common bile duct 1.0 cm in diameter. I do not see a definite CBD stone, sensitivity adversely affected by motion. There is some patchy and faint accentuated T2 signal in the left hepatic lobe prior to contrast administration. Post-contrast images confirm left portal vein thrombosis for example with substantial filling defect in the left portal vein on image 605 of series 26. The right portal  vein, main portal vein, superior mesenteric vein, and splenic vein appear patent. I do not see definite tumor in the liver to indicate that the portal vein thrombosis is from a malignant cause, although clearly motion artifact adversely affects assessment. Pancreas:  Atrophic pancreas, otherwise unremarkable. Spleen:  Unremarkable Adrenals/Urinary Tract: Small benign renal cysts were no further workup. Adrenal glands unremarkable. Stomach/Bowel: Unremarkable Vascular/Lymphatic: Atherosclerosis is present, including aortoiliac atherosclerotic disease. Left portal vein thrombosis as noted above. Other:  No supplemental non-categorized findings. Musculoskeletal: Mild dextroconvex lumbar scoliosis with lumbar spondylosis and degenerative disc disease. IMPRESSION: 1. Acute left portal vein thrombosis. No definite underlying mass is identified, although sensitivity is adversely affected by motion artifact. 2. Cholelithiasis  with gallbladder wall thickening and accentuated arterial phase hepatic parenchymal enhancement adjacent to the gallbladder which can be associated with acute cholecystitis. 3. Common bile duct 1.0 cm in diameter, without a definite CBD stone identified. 4. Atrophic pancreas. 5. Lumbar spondylosis and degenerative disc disease. Electronically Signed   By: Van Clines M.D.   On: 01/27/2022 21:13   MR 3D Recon At Scanner  Result Date: 01/27/2022 CLINICAL DATA:  Right upper quadrant abdominal pain EXAM: MRI ABDOMEN WITHOUT AND WITH CONTRAST (INCLUDING MRCP) TECHNIQUE: Multiplanar multisequence MR imaging of the abdomen was performed both before and after the administration of intravenous contrast. Heavily T2-weighted images of the biliary and pancreatic ducts were obtained, and three-dimensional MRCP images were rendered by post processing. CONTRAST:  35m GADAVIST GADOBUTROL 1 MMOL/ML IV SOLN COMPARISON:  CT scan 01/27/2022 FINDINGS: Despite efforts by the technologist and patient, motion artifact is present on today's exam and could not be eliminated. This reduces exam sensitivity and specificity. Lower chest: Unremarkable Hepatobiliary: 0.2 cm gallstone in the gallbladder with sludge and additional small gallstones noted. There is gallbladder wall thickening as well as accentuated arterial phase enhancement in the hepatic parenchyma adjacent to the gallbladder which can be associated with cholecystitis. Common bile duct 1.0 cm in diameter. I do not see a definite CBD stone, sensitivity adversely affected by motion. There is some patchy and faint accentuated T2 signal in the left hepatic lobe prior to contrast administration. Post-contrast images confirm left portal vein thrombosis for example with substantial filling defect in the left portal vein on image 605 of series 26. The right portal vein, main portal vein, superior mesenteric vein, and splenic vein appear patent. I do not see definite tumor in  the liver to indicate that the portal vein thrombosis is from a malignant cause, although clearly motion artifact adversely affects assessment. Pancreas:  Atrophic pancreas, otherwise unremarkable. Spleen:  Unremarkable Adrenals/Urinary Tract: Small benign renal cysts were no further workup. Adrenal glands unremarkable. Stomach/Bowel: Unremarkable Vascular/Lymphatic: Atherosclerosis is present, including aortoiliac atherosclerotic disease. Left portal vein thrombosis as noted above. Other:  No supplemental non-categorized findings. Musculoskeletal: Mild dextroconvex lumbar scoliosis with lumbar spondylosis and degenerative disc disease. IMPRESSION: 1. Acute left portal vein thrombosis. No definite underlying mass is identified, although sensitivity is adversely affected by motion artifact. 2. Cholelithiasis with gallbladder wall thickening and accentuated arterial phase hepatic parenchymal enhancement adjacent to the gallbladder which can be associated with acute cholecystitis. 3. Common bile duct 1.0 cm in diameter, without a definite CBD stone identified. 4. Atrophic pancreas. 5. Lumbar spondylosis and degenerative disc disease. Electronically Signed   By: WVan ClinesM.D.   On: 01/27/2022 21:13   UK[place]Venous Img Lower Bilateral (DVT)  Result Date: 01/27/2022 CLINICAL DATA:  Knee replacements bilaterally in October with positive D-dimer  EXAM: BILATERAL LOWER EXTREMITY VENOUS DOPPLER ULTRASOUND TECHNIQUE: Gray-scale sonography with graded compression, as well as color Doppler and duplex ultrasound were performed to evaluate the lower extremity deep venous systems from the level of the common femoral vein and including the common femoral, femoral, profunda femoral, popliteal and calf veins including the posterior tibial, peroneal and gastrocnemius veins when visible. The superficial great saphenous vein was also interrogated. Spectral Doppler was utilized to evaluate flow at rest and with distal  augmentation maneuvers in the common femoral, femoral and popliteal veins. COMPARISON:  None Available. FINDINGS: RIGHT LOWER EXTREMITY Common Femoral Vein: No evidence of thrombus. Normal compressibility, respiratory phasicity and response to augmentation. Saphenofemoral Junction: No evidence of thrombus. Normal compressibility and flow on color Doppler imaging. Profunda Femoral Vein: No evidence of thrombus. Normal compressibility and flow on color Doppler imaging. Femoral Vein: No evidence of thrombus. Normal compressibility, respiratory phasicity and response to augmentation. Popliteal Vein: No evidence of thrombus. Normal compressibility, respiratory phasicity and response to augmentation. Calf Veins: No evidence of thrombus. Normal compressibility and flow on color Doppler imaging. Superficial Great Saphenous Vein: No evidence of thrombus. Normal compressibility. Venous Reflux:  None. Other Findings:  None. LEFT LOWER EXTREMITY Common Femoral Vein: No evidence of thrombus. Normal compressibility, respiratory phasicity and response to augmentation. Saphenofemoral Junction: No evidence of thrombus. Normal compressibility and flow on color Doppler imaging. Profunda Femoral Vein: No evidence of thrombus. Normal compressibility and flow on color Doppler imaging. Femoral Vein: No evidence of thrombus. Normal compressibility, respiratory phasicity and response to augmentation. Popliteal Vein: No evidence of thrombus. Normal compressibility, respiratory phasicity and response to augmentation. Calf Veins: The posterior tibial vein is occluded at the mid calf due to acute DVT. Superficial Great Saphenous Vein: No evidence of thrombus. Normal compressibility. Venous Reflux:  None. Other Findings:  None. IMPRESSION: Acute occlusive DVT in the left posterior tibial vein at the mid calf. No DVT on the right. These results were called by telephone at the time of interpretation on 01/27/2022 at 6:35 pm to provider Ivor Costa ,  who verbally acknowledged these results. Electronically Signed   By: Placido Sou M.D.   On: 01/27/2022 18:36   US ABDOMEN LIMITED RUQ (LIVER/GB)  Result Date: 01/27/2022 CLINICAL DATA:  Right upper quadrant abdominal pain EXAM: ULTRASOUND ABDOMEN LIMITED RIGHT UPPER QUADRANT COMPARISON:  CT same day FINDINGS: Gallbladder: Gallbladder wall thickening up to 9 mm. No Murphy sign. 2.6 cm gallstone at the gallbladder neck. Findings worrisome for acute cholecystitis despite the absence of a Murphy sign. Common bile duct: Diameter: Dilated to 12 mm.  No visible ductal stone. Liver: Normal appearance of the liver parenchyma itself. Portal vein is patent on color Doppler imaging with normal direction of blood flow towards the liver. Other: None. IMPRESSION: 1. 2.6 cm gallstone at the gallbladder neck with gallbladder wall thickening. No Murphy sign. Findings worrisome for acute cholecystitis, despite the absence of a demonstrable Murphy sign. 2. Dilated common bile duct to 12 mm. No visible ductal stone. Electronically Signed   By: Nelson Chimes M.D.   On: 01/27/2022 14:41   CT Angio Chest PE W and/or Wo Contrast  Result Date: 01/27/2022 CLINICAL DATA:  Pulmonary embolism (PE) suspected, high prob; Abdominal pain, acute, nonlocalized EXAM: CT ANGIOGRAPHY CHEST CT ABDOMEN AND PELVIS WITH CONTRAST TECHNIQUE: Multidetector CT imaging of the chest was performed using the standard protocol during bolus administration of intravenous contrast. Multiplanar CT image reconstructions and MIPs were obtained to evaluate the vascular anatomy. Multidetector  CT imaging of the abdomen and pelvis was performed using the standard protocol during bolus administration of intravenous contrast. RADIATION DOSE REDUCTION: This exam was performed according to the departmental dose-optimization program which includes automated exposure control, adjustment of the mA and/or kV according to patient size and/or use of iterative reconstruction  technique. CONTRAST:  41m OMNIPAQUE IOHEXOL 350 MG/ML SOLN COMPARISON:  None Available. FINDINGS: CTA CHEST FINDINGS Cardiovascular: Satisfactory opacification of the pulmonary arteries to the segmental level. No evidence of pulmonary embolism. Pulmonary trunk measures 3.5 cm in diameter. Thoracic aorta is nonaneurysmal. Scattered atherosclerotic vascular calcifications of the aorta and coronary arteries. Heart size is upper limits of normal. No pericardial effusion. Mediastinum/Nodes: No enlarged mediastinal, hilar, or axillary lymph nodes. Thyroid gland, trachea, and esophagus demonstrate no significant findings. Lungs/Pleura: Trace layering bilateral pleural effusions. Mild bibasilar subsegmental atelectasis. No pneumothorax. Musculoskeletal: No chest wall abnormality. No acute or significant osseous findings. Degenerative thoracic spondylosis. Review of the MIP images confirms the above findings. CT ABDOMEN and PELVIS FINDINGS Hepatobiliary: 4.4 cm stone in the region of the gallbladder neck. Numerous small stones and/or sludge layering within the gallbladder fundus. Gallbladder is moderately distended with diffuse gallbladder wall thickening. No focal liver lesion is identified. There is mild intra and extrahepatic biliary ductal dilatation. Pancreas: Fatty atrophy of the pancreas. No focal pancreatic lesion. No ductal dilatation or inflammatory changes. Spleen: Normal in size without focal abnormality. Adrenals/Urinary Tract: Unremarkable adrenal glands. Kidneys enhance symmetrically without solid lesion, stone, or hydronephrosis. Ureters are nondilated. Urinary bladder appears unremarkable for the degree of distention. Stomach/Bowel: Stomach is within normal limits. Appendix appears normal. Scattered colonic diverticulosis. No evidence of bowel wall thickening, distention, or inflammatory changes. Vascular/Lymphatic: Non-enhancement of the left portal vein suspicious for portal vein thrombosis (series 2,  image 26). Main portal vein and right portal veins enhance homogeneously. Aortoiliac atherosclerosis. No abdominopelvic lymphadenopathy. Reproductive: Status post hysterectomy. No adnexal masses. Other: No free fluid. No abdominopelvic fluid collection. No pneumoperitoneum. Musculoskeletal: No acute or significant osseous findings. Degenerative lumbar spondylosis. Review of the MIP images confirms the above findings. IMPRESSION: CTA Chest: 1. No evidence of pulmonary embolism. 2. Trace layering bilateral pleural effusions with mild bibasilar subsegmental atelectasis. 3. Mildly dilated pulmonary trunk suggesting pulmonary arterial hypertension. 4. Aortic and coronary artery atherosclerosis. CT Abdomen/Pelvis: 1. Non-enhancement of the left portal vein suspicious for portal vein thrombosis. Main portal vein and right portal veins enhance homogeneously. 2. Cholelithiasis with a 4.4 cm stone in the region of the gallbladder neck. Gallbladder is moderately distended with diffuse gallbladder wall thickening and mild intra and extrahepatic biliary ductal dilatation. Findings are concerning for acute cholecystitis. 3. Colonic diverticulosis without evidence of acute diverticulitis. 4. Aortic atherosclerosis (ICD10-I70.0). Electronically Signed   By: NDavina PokeD.O.   On: 01/27/2022 13:38   CT ABDOMEN PELVIS W CONTRAST  Result Date: 01/27/2022 CLINICAL DATA:  Pulmonary embolism (PE) suspected, high prob; Abdominal pain, acute, nonlocalized EXAM: CT ANGIOGRAPHY CHEST CT ABDOMEN AND PELVIS WITH CONTRAST TECHNIQUE: Multidetector CT imaging of the chest was performed using the standard protocol during bolus administration of intravenous contrast. Multiplanar CT image reconstructions and MIPs were obtained to evaluate the vascular anatomy. Multidetector CT imaging of the abdomen and pelvis was performed using the standard protocol during bolus administration of intravenous contrast. RADIATION DOSE REDUCTION: This exam  was performed according to the departmental dose-optimization program which includes automated exposure control, adjustment of the mA and/or kV according to patient size and/or use of iterative reconstruction technique.  CONTRAST:  70m OMNIPAQUE IOHEXOL 350 MG/ML SOLN COMPARISON:  None Available. FINDINGS: CTA CHEST FINDINGS Cardiovascular: Satisfactory opacification of the pulmonary arteries to the segmental level. No evidence of pulmonary embolism. Pulmonary trunk measures 3.5 cm in diameter. Thoracic aorta is nonaneurysmal. Scattered atherosclerotic vascular calcifications of the aorta and coronary arteries. Heart size is upper limits of normal. No pericardial effusion. Mediastinum/Nodes: No enlarged mediastinal, hilar, or axillary lymph nodes. Thyroid gland, trachea, and esophagus demonstrate no significant findings. Lungs/Pleura: Trace layering bilateral pleural effusions. Mild bibasilar subsegmental atelectasis. No pneumothorax. Musculoskeletal: No chest wall abnormality. No acute or significant osseous findings. Degenerative thoracic spondylosis. Review of the MIP images confirms the above findings. CT ABDOMEN and PELVIS FINDINGS Hepatobiliary: 4.4 cm stone in the region of the gallbladder neck. Numerous small stones and/or sludge layering within the gallbladder fundus. Gallbladder is moderately distended with diffuse gallbladder wall thickening. No focal liver lesion is identified. There is mild intra and extrahepatic biliary ductal dilatation. Pancreas: Fatty atrophy of the pancreas. No focal pancreatic lesion. No ductal dilatation or inflammatory changes. Spleen: Normal in size without focal abnormality. Adrenals/Urinary Tract: Unremarkable adrenal glands. Kidneys enhance symmetrically without solid lesion, stone, or hydronephrosis. Ureters are nondilated. Urinary bladder appears unremarkable for the degree of distention. Stomach/Bowel: Stomach is within normal limits. Appendix appears normal. Scattered  colonic diverticulosis. No evidence of bowel wall thickening, distention, or inflammatory changes. Vascular/Lymphatic: Non-enhancement of the left portal vein suspicious for portal vein thrombosis (series 2, image 26). Main portal vein and right portal veins enhance homogeneously. Aortoiliac atherosclerosis. No abdominopelvic lymphadenopathy. Reproductive: Status post hysterectomy. No adnexal masses. Other: No free fluid. No abdominopelvic fluid collection. No pneumoperitoneum. Musculoskeletal: No acute or significant osseous findings. Degenerative lumbar spondylosis. Review of the MIP images confirms the above findings. IMPRESSION: CTA Chest: 1. No evidence of pulmonary embolism. 2. Trace layering bilateral pleural effusions with mild bibasilar subsegmental atelectasis. 3. Mildly dilated pulmonary trunk suggesting pulmonary arterial hypertension. 4. Aortic and coronary artery atherosclerosis. CT Abdomen/Pelvis: 1. Non-enhancement of the left portal vein suspicious for portal vein thrombosis. Main portal vein and right portal veins enhance homogeneously. 2. Cholelithiasis with a 4.4 cm stone in the region of the gallbladder neck. Gallbladder is moderately distended with diffuse gallbladder wall thickening and mild intra and extrahepatic biliary ductal dilatation. Findings are concerning for acute cholecystitis. 3. Colonic diverticulosis without evidence of acute diverticulitis. 4. Aortic atherosclerosis (ICD10-I70.0). Electronically Signed   By: NDavina PokeD.O.   On: 01/27/2022 13:38   DG Chest 2 View  Result Date: 01/27/2022 CLINICAL DATA:  Fevers EXAM: CHEST - 2 VIEW COMPARISON:  08/03/16 CXR FINDINGS: No pleural effusion. No pneumothorax. Normal cardiac and mediastinal contours. No displaced rib fractures. Degenerative changes of the bilateral glenohumeral joints, progressed on the right compared to 2018. Vertebral body heights are maintained. Rounded densities in the upper abdomen only visualized on the  lateral view could represent a combination of renal and gallstones. IMPRESSION: 1.  No active cardiopulmonary disease. 2.  Possible small left sided renal stone. Electronically Signed   By: HMarin RobertsM.D.   On: 01/27/2022 10:06    Cardiac Studies   Prelim echo: Normal LV function  Patient Profile     LNELWYN HEBDONis a 76y.o. female with a hx of CAD/PCI who is being seen 01/27/2022 for the evaluation of elevated troponins in the setting of rigors, chills, gallbladder disease  Assessment & Plan    NSTEMI, history of CAD/PCI to LAD 2015 Troponin up to  5800 now trending downward -Echocardiogram with no focal wall motion abnormality On heparin infusion for non-STEMI and for small left lower extremity DVT after left knee surgery October 2023 Plan for Myoview tomorrow -Continue PTA aspirin, Zetia, Crestor.   2.  Hypertension Medications on hold in the setting of hypotension   3.  Possible cold cystitis with dilated common bile duct Percutaneous cholecystectomy tube placed today -IV antibiotics as per primary team Close follow-up with GI  On discussion with patient and family at the bedside concerning recent events, elevated cardiac enzymes, work-up available, management  Total encounter time more than 50 minutes  Greater than 50% was spent in counseling and coordination of care with the patient     For questions or updates, please contact [place] Please consult [email or web address] for contact info under        Signed, Ida Rogue, MD  01/28/2022, 5:09 PM

## 2022-01-28 NOTE — Progress Notes (Signed)
ANTICOAGULATION CONSULT NOTE  Pharmacy Consult for Heparin Infusion Indication: ACS/NSTEMI  No Known Allergies  Patient Measurements: Height: '5\' 6"'$  (167.6 cm) Weight: 98.5 kg (217 lb 2.5 oz) IBW/kg (Calculated) : 59.3 Heparin Dosing Weight: 82.2 kg  Vital Signs: Temp: 98 F (36.7 C) (11/30 1125) Temp Source: Oral (11/30 1125) BP: 112/47 (11/30 1125) Pulse Rate: 75 (11/30 1125)  Labs: Recent Labs    01/27/22 0942 01/27/22 1446 01/27/22 1450 01/27/22 1909 01/27/22 2242 01/28/22 0322  HGB 9.6*  --   --   --   --  9.3*  HCT 30.1*  --   --   --   --  28.8*  PLT 118*  --   --   --   --  112*  APTT  --  42*  --   --   --   --   LABPROT  --  14.2  --   --   --   --   INR  --  1.1  --   --   --   --   HEPARINUNFRC  --   --   --   --  0.25*  --   CREATININE 1.32*  --   --   --   --  1.21*  TROPONINIHS 865*  --    < > 3,514* 5,864* 3,344*   < > = values in this interval not displayed.    Estimated Creatinine Clearance: 46.8 mL/min (A) (by C-G formula based on SCr of 1.21 mg/dL (H)).   Medical History: Past Medical History:  Diagnosis Date   Coronary artery disease    a. 07/2013 STEMI/PCI ([place]): LM nl, LAD 100p (thrombectomy & 3.5x23 Alpine DES), LCX nl, RCA nl, EF 45%.   Depression    Diastolic dysfunction    a.) TTE 11/26/2020 - LVEF 50-55%; G2DD.   Endometrial cancer ([place])    Gravida 3 para 3    Hyperlipidemia    Hypertension    Hypertensive heart disease    Hypothyroid    Ischemic cardiomyopathy    a.) 07/2013 EF 45% @ time of STEMI; b.) 11/2013 Echo: EF 55-60%, mildly dil LA, nl RV fxn. c.) TTE 07/29/2015 - LVEF 55-60%, mild LA and RV dilation. d.) TTE 11/26/2020 - LVEF 50-55%; G2DD.   Menopause    Obstructive sleep apnea on CPAP    Osteoarthritis    PONV (postoperative nausea and vomiting)    ST elevation myocardial infarction (STEMI) of anterolateral wall ([place]) 08/14/2013   a.) LVEF reduced at 45%; Tx'd with trombectomy and PCI to 100% LAD; 3.5 x 23 mm Xience  Alpine DES placed.   T2DM (type 2 diabetes mellitus) ([place])    Thyroid cancer ([place])    a.) s/p total thyroidectomy. b.) on daily levothyroxine.    Medications:  Scheduled:   aspirin EC  81 mg Oral Daily   ezetimibe  10 mg Oral Daily   fentaNYL       FLUoxetine  10 mg Oral Daily   insulin aspart  0-5 Units Subcutaneous QHS   insulin aspart  0-9 Units Subcutaneous TID WC   levothyroxine  125 mcg Oral Q0600   midazolam       rosuvastatin  20 mg Oral Daily   sodium chloride flush  5 mL Intracatheter Q8H   Infusions:   sodium chloride 75 mL/hr at 01/28/22 0644   heparin     piperacillin-tazobactam (ZOSYN)  IV 3.375 g (01/28/22 0644)   PRN: acetaminophen, fentaNYL, hydrALAZINE, midazolam,  morphine injection, nitroGLYCERIN, ondansetron (ZOFRAN) IV, oxyCODONE, oxyCODONE-acetaminophen  Assessment: [name] is a 76 y.o. female presenting with elevated troponins and D-Dimer. PMH significant for CAD (STEMI s/p stent 2015), HLD, HTN, DM, hypothyroidism (s/p thyroidectomy), depression, OSA on CPAP, endometrial cancer, dCHF, knee replacements (R-11/2020, L-11/2021), CKD-3 . Patient was not on [facility] PTA per chart review. Pharmacy has been consulted to initiate and manage heparin infusion.   Baseline Labs: aPTT 42, PT 14.2, INR 1.1, Hgb 9.6, Hct 30.1, Plt 118   Goal of Therapy:  Heparin level 0.3-0.7 units/ml Monitor platelets by anticoagulation protocol: Yes    Date Time HL Rate/Comment  11/29 2242 0.25 1050/SUBtherapeutic 11/30 0830 --- 0/paused for procedure 11/30 1141 --- 1200/resumed post procedure  Plan:  Resume infusion at 1200 units/hr Check HL in 8 hours  Continue to monitor H&H and platelets daily while on heparin infusion   Gretel Acre, PharmD PGY1 Pharmacy Resident 01/28/2022 1:01 PM

## 2022-01-28 NOTE — Procedures (Signed)
Interventional Radiology Procedure Note  Procedure: Image guided drain placement, perc chole.  4F pigtail drain.  Complications: None  EBL: None Sample: Culture sent  Recommendations: - Routine drain care, with sterile flushes, record output - follow up Cx - routine wound care - OK to advance diet per primary order - OK to restart hep ggt in 1 hr  Signed,  Dulcy Fanny. Earleen Newport, DO

## 2022-01-28 NOTE — Progress Notes (Addendum)
Pharmacy Antibiotic Note  [name] is a 76 y.o. female admitted on 01/27/2022 with Cholecystitis. PMH significant for CAD (STEMI s/p stent 2015), HLD, HTN, DM, hypothyroidism (s/p thyroidectomy), depression, OSA on CPAP, endometrial cancer, dCHF, knee replacements (R-11/2020, L-11/2021), CKD-3. CTAP with multiple findings including cholelithiasis with a 4.4 cm stone in the region of the gallbladder neck concerning for acute cholecystitis. Surgery recommending MRCP to further assess IAI. Pharmacy has been consulted for Zosyn dosing.  Plan: Day 2 of antibiotics Continue Zosyn 3.375 g IV Q8H Continue to monitor renal function and follow culture results   Height: '5\' 6"'$  (167.6 cm) Weight: 98.5 kg (217 lb 2.5 oz) IBW/kg (Calculated) : 59.3  Temp (24hrs), Avg:98.5 F (36.9 C), Min:98.1 F (36.7 C), Max:99 F (37.2 C)  Recent Labs  Lab 01/27/22 0942 01/27/22 1048 01/27/22 1308 01/28/22 0322  WBC 6.4  --   --  16.4*  CREATININE 1.32*  --   --  1.21*  LATICACIDVEN  --  1.0 0.9  --      Estimated Creatinine Clearance: 46.8 mL/min (A) (by C-G formula based on SCr of 1.21 mg/dL (H)).    No Known Allergies  Antimicrobials this admission: 11/29 Cefepime, Metronidazole, Vancomycin x1 11/30 Zosyn >>    Dose adjustments this admission: N/A  Microbiology results: 11/29 BCx: 1 of 4 bottles (aerobic) growing GNR, BCID Klebsiella, no resistance genes detected 11/29 UCx: IP  Thank you for allowing pharmacy to be a part of this patient's care.  Gretel Acre, PharmD PGY1 Pharmacy Resident 01/28/2022 8:04 AM

## 2022-01-28 NOTE — Progress Notes (Signed)
*  PRELIMINARY RESULTS* Echocardiogram 2D Echocardiogram has been performed.  [name] 01/28/2022, 1:27 PM

## 2022-01-28 NOTE — Progress Notes (Signed)
PHARMACY - PHYSICIAN COMMUNICATION CRITICAL VALUE ALERT - BLOOD CULTURE IDENTIFICATION (BCID)  [name] is an 76 y.o. female who presented to [facility] on 01/27/2022 with a chief complaint of ongoing fever  Assessment:  1/4, aerobic bottle, Klebsiella pneumoniae (source: intra-abdominal >> cholecystitis)  Name of physician (or Provider) Contacted: Fritzi Mandes  Current antibiotics: Zosyn 3.375 g IV q8H  Changes to prescribed antibiotics recommended:  No changes recommended while we await susceptibilities.  Results for orders placed or performed during the hospital encounter of 01/27/22  Blood Culture ID Panel (Reflexed) (Collected: 01/27/2022 10:48 AM)  Result Value Ref Range   Enterococcus faecalis NOT DETECTED NOT DETECTED   Enterococcus Faecium NOT DETECTED NOT DETECTED   Listeria monocytogenes NOT DETECTED NOT DETECTED   Staphylococcus species NOT DETECTED NOT DETECTED   Staphylococcus aureus (BCID) NOT DETECTED NOT DETECTED   Staphylococcus epidermidis NOT DETECTED NOT DETECTED   Staphylococcus lugdunensis NOT DETECTED NOT DETECTED   Streptococcus species NOT DETECTED NOT DETECTED   Streptococcus agalactiae NOT DETECTED NOT DETECTED   Streptococcus pneumoniae NOT DETECTED NOT DETECTED   Streptococcus pyogenes NOT DETECTED NOT DETECTED   A.calcoaceticus-baumannii NOT DETECTED NOT DETECTED   Bacteroides fragilis NOT DETECTED NOT DETECTED   Enterobacterales DETECTED (A) NOT DETECTED   Enterobacter cloacae complex NOT DETECTED NOT DETECTED   Escherichia coli NOT DETECTED NOT DETECTED   Klebsiella aerogenes NOT DETECTED NOT DETECTED   Klebsiella oxytoca NOT DETECTED NOT DETECTED   Klebsiella pneumoniae DETECTED (A) NOT DETECTED   Proteus species NOT DETECTED NOT DETECTED   Salmonella species NOT DETECTED NOT DETECTED   Serratia marcescens NOT DETECTED NOT DETECTED   Haemophilus influenzae NOT DETECTED NOT DETECTED   Neisseria meningitidis NOT DETECTED NOT DETECTED    Pseudomonas aeruginosa NOT DETECTED NOT DETECTED   Stenotrophomonas maltophilia NOT DETECTED NOT DETECTED   Candida albicans NOT DETECTED NOT DETECTED   Candida auris NOT DETECTED NOT DETECTED   Candida glabrata NOT DETECTED NOT DETECTED   Candida krusei NOT DETECTED NOT DETECTED   Candida parapsilosis NOT DETECTED NOT DETECTED   Candida tropicalis NOT DETECTED NOT DETECTED   Cryptococcus neoformans/gattii NOT DETECTED NOT DETECTED   CTX-M ESBL NOT DETECTED NOT DETECTED   Carbapenem resistance IMP NOT DETECTED NOT DETECTED   Carbapenem resistance KPC NOT DETECTED NOT DETECTED   Carbapenem resistance NDM NOT DETECTED NOT DETECTED   Carbapenem resist OXA 48 LIKE NOT DETECTED NOT DETECTED   Carbapenem resistance VIM NOT DETECTED NOT DETECTED    Will M. Ouida Sills, PharmD PGY-1 Pharmacy Resident 01/28/2022 9:05 AM

## 2022-01-28 NOTE — Progress Notes (Signed)
Patient clinically stable post Chole tube 10 FR placement per DR Earleen Newport, tolerated well. Vitals stable pre and post procedure. Received Versed 0.5 mg along with Fentanyl 25 mcg IV for procedure. Report given to Chrystal RN 2A post procedure. Denies complaints at present time.

## 2022-01-28 NOTE — Progress Notes (Addendum)
ANTICOAGULATION CONSULT NOTE  Pharmacy Consult for Heparin Infusion Indication: ACS/NSTEMI  No Known Allergies  Patient Measurements: Height: '5\' 6"'$  (167.6 cm) Weight: 98.5 kg (217 lb 2.5 oz) IBW/kg (Calculated) : 59.3 Heparin Dosing Weight: 82.2 kg  Vital Signs: Temp: 99 F (37.2 C) (11/30 1609) Temp Source: Oral (11/30 2105) BP: 113/45 (11/30 2105) Pulse Rate: 75 (11/30 2105)  Labs: Recent Labs    01/27/22 0942 01/27/22 1446 01/27/22 1450 01/27/22 1909 01/27/22 2242 01/28/22 0322 01/28/22 2030  HGB 9.6*  --   --   --   --  9.3*  --   HCT 30.1*  --   --   --   --  28.8*  --   PLT 118*  --   --   --   --  112*  --   APTT  --  42*  --   --   --   --   --   LABPROT  --  14.2  --   --   --   --   --   INR  --  1.1  --   --   --   --   --   HEPARINUNFRC  --   --   --   --  0.25*  --  0.28*  CREATININE 1.32*  --   --   --   --  1.21*  --   TROPONINIHS 865*  --    < > 3,514* 5,864* 3,344*  --    < > = values in this interval not displayed.     Estimated Creatinine Clearance: 46.8 mL/min (A) (by C-G formula based on SCr of 1.21 mg/dL (H)).   Medical History: Past Medical History:  Diagnosis Date   Coronary artery disease    a. 07/2013 [place] LM nl, LAD 100p (thrombectomy & 3.5x23 Alpine DES), LCX nl, RCA nl, EF 45%.   Depression    Diastolic dysfunction    a.) TTE 11/26/2020 - LVEF 50-55%; G2DD.   Endometrial cancer ([place])    Gravida 3 para 3    Hyperlipidemia    Hypertension    Hypertensive heart disease    Hypothyroid    Ischemic cardiomyopathy    a.) 07/2013 EF 45% @ time of STEMI; b.) 11/2013 Echo: EF 55-60%, mildly dil LA, nl RV fxn. c.) TTE 07/29/2015 - LVEF 55-60%, mild LA and RV dilation. d.) TTE 11/26/2020 - LVEF 50-55%; G2DD.   Menopause    Obstructive sleep apnea on CPAP    Osteoarthritis    PONV (postoperative nausea and vomiting)    ST elevation myocardial infarction (STEMI) of anterolateral wall ([place]) 08/14/2013   a.) LVEF reduced at 45%;  Tx'd with trombectomy and PCI to 100% LAD; 3.5 x 23 mm Xience Alpine DES placed.   T2DM (type 2 diabetes mellitus) ([place])    Thyroid cancer ([place])    a.) s/p total thyroidectomy. b.) on daily levothyroxine.   Medications:  Heparin Dosing Weight: 82.2 kg Scheduled:   aspirin EC  81 mg Oral Daily   ezetimibe  10 mg Oral Daily   fentaNYL       FLUoxetine  10 mg Oral Daily   insulin aspart  0-5 Units Subcutaneous QHS   insulin aspart  0-9 Units Subcutaneous TID WC   levothyroxine  125 mcg Oral Q0600   midazolam       rosuvastatin  20 mg Oral Daily   sodium chloride flush  5 mL Intracatheter Q8H  Infusions:   sodium chloride 75 mL/hr at 01/28/22 1832   heparin 1,200 Units/hr (01/28/22 1832)   piperacillin-tazobactam (ZOSYN)  IV 3.375 g (01/28/22 2137)   PRN: acetaminophen, fentaNYL, hydrALAZINE, midazolam, morphine injection, nitroGLYCERIN, ondansetron (ZOFRAN) IV, oxyCODONE-acetaminophen  Assessment: [name] is a 76 y.o. female presenting with elevated troponins and D-Dimer. PMH significant for CAD (STEMI s/p stent 2015), HLD, HTN, DM, hypothyroidism (s/p thyroidectomy), depression, OSA on CPAP, endometrial cancer, dCHF, knee replacements (R-11/2020, L-11/2021), CKD-3 . Patient was not on [facility] PTA per chart review. Pharmacy has been consulted to initiate and manage heparin infusion.   Baseline Labs: aPTT 42, PT 14.2, INR 1.1, Hgb 9.6, Hct 30.1, Plt 118   Goal of Therapy:  Heparin level 0.3-0.7 units/ml Monitor platelets by anticoagulation protocol: Yes    Date Time HL Rate/Comment  11/29 2242 0.25 1050/SUBtherapeutic 11/30 0830 --- 0/paused for procedure 11/30 1141 --- 1200/resumed post procedure 11/30 2030 0.28 1200>1350 un/hr  Plan:  [place] 1250 un x1; then increase heparin infusion rate to 1350 units/hr Check HL in 8 hours from rate change Continue to monitor H&H and platelets daily while on heparin infusion   Lorna Dibble, PharmD, [facility] Main Clinical  Pharmacist 01/28/2022 9:49 PM

## 2022-01-28 NOTE — Care Management Important Message (Signed)
Important Message  Patient Details  Name: [name] MRN: [id number] Date of Birth: [birth year]   Medicare Important Message Given:  N/A - LOS <3 / Initial given by admissions     Dannette Barbara 01/28/2022, 5:19 PM

## 2022-01-28 NOTE — Consult Note (Signed)
Chief Complaint: Patient was seen in consultation today for image-guided percutaneous cholecystostomy tube placement  Referring Physician(s): Dr Benjamine Sprague  Supervising Physician: Corrie Mckusick  Patient Status: [facility] - In-pt  History of Present Illness: [name] is a 76 y.o. female with PMH of CAD, depression, endometrial cancer, hypertension, history of STEMI, obstructive sleep apnea, type II diabetes mellitus, and post-operative nausea & vomiting being seen today for image-guided percutaneous cholecystostomy tube placement. The patient had a recent admission on 11/23-11/25 at [facility] for elevated troponin levels. The patient presented to [facility] ED on 11/29 with chief concern of fever with rigors and chills for several days with a reported Tmax of 100.39F. Imaging revealed concern for possible cholecystitis along with a dilated common bile duct. Blood test revealed a troponin level of 865. Patient was subsequently admitted with a diagnosis of NSTEMI and cholecystitis. IR was consulted to perform percutaneous cholecystostomy tube placement.  Past Medical History:  Diagnosis Date   Coronary artery disease    a. 07/2013 STEMI/PCI ([place]): LM nl, LAD 100p (thrombectomy & 3.5x23 Alpine DES), LCX nl, RCA nl, EF 45%.   Depression    Diastolic dysfunction    a.) TTE 11/26/2020 - LVEF 50-55%; G2DD.   Endometrial cancer ([place])    Gravida 3 para 3    Hyperlipidemia    Hypertension    Hypertensive heart disease    Hypothyroid    Ischemic cardiomyopathy    a.) 07/2013 EF 45% @ time of STEMI; b.) 11/2013 Echo: EF 55-60%, mildly dil LA, nl RV fxn. c.) TTE 07/29/2015 - LVEF 55-60%, mild LA and RV dilation. d.) TTE 11/26/2020 - LVEF 50-55%; G2DD.   Menopause    Obstructive sleep apnea on CPAP    Osteoarthritis    PONV (postoperative nausea and vomiting)    ST elevation myocardial infarction (STEMI) of anterolateral wall ([place]) 08/14/2013   a.) LVEF reduced at 45%; Tx'd with trombectomy and PCI to  100% LAD; 3.5 x 23 mm Xience Alpine DES placed.   T2DM (type 2 diabetes mellitus) ([place])    Thyroid cancer ([place])    a.) s/p total thyroidectomy. b.) on daily levothyroxine.    Past Surgical History:  Procedure Laterality Date   CATARACT EXTRACTION W/PHACO Left 08/30/2016   Procedure: CATARACT EXTRACTION PHACO AND INTRAOCULAR LENS PLACEMENT ([place]) Left daibetic;  Surgeon: Leandrew Koyanagi, MD;  Location: [place];  Service: Ophthalmology;  Laterality: Left;  Diabetic - insulin and oral meds sleep apnea   CATARACT EXTRACTION W/PHACO Right 10/07/2016   Procedure: CATARACT EXTRACTION PHACO AND INTRAOCULAR LENS PLACEMENT ([facility]);  Surgeon: Leandrew Koyanagi, MD;  Location: [facility];  Service: Ophthalmology;  Laterality: Right;  Lot# [id number] H [place]: 00:43.3 AP%: 12.2 CDE: 5.28   CORONARY ANGIOPLASTY WITH STENT PLACEMENT Left 08/14/2013   Procedure: CORONARY ANGIOPLASTY WITH STENT PLACEMENT (3.5 x 23 mm Xience Alpine DES to LAD); Location: [facility] [place]; Surgeon: Algernon Huxley, MD   GANGLION CYST EXCISION Right [place]   total   TOTAL KNEE ARTHROPLASTY Right 12/18/2020   Procedure: RIGHT TOTAL KNEE ARTHROPLASTY;  Surgeon: Thornton Park, MD;  Location: [facility];  Service: Orthopedics;  Laterality: Right;   TOTAL KNEE ARTHROPLASTY Left 12/10/2021   Procedure: TOTAL KNEE ARTHROPLASTY;  Surgeon: Thornton Park, MD;  Location: [facility];  Service: Orthopedics;  Laterality: Left;    Allergies: Patient has no known allergies.  Medications: Prior to Admission medications   Medication Sig  Start Date End Date Taking? Authorizing Provider  acetaminophen (TYLENOL) 650 MG CR tablet Take 1,950 mg by mouth daily.   Yes [provider]  amLODipine (NORVASC) 2.5 MG tablet TAKE 1 TABLET BY MOUTH DAILY 06/16/21  Yes Gollan, Kathlene November, MD  aspirin EC (ASPIRIN 81) 81 MG tablet Take 81 mg by mouth daily.   Yes  [provider]  bisacodyl (DULCOLAX) 5 MG EC tablet Take 2 tablets (10 mg total) by mouth daily as needed for moderate constipation. 12/11/21  Yes Thornton Park, MD  carvedilol (COREG) 3.125 MG tablet Take 3.125 mg by mouth daily.   Yes [provider]  docusate sodium (COLACE) 100 MG capsule Take 1 capsule (100 mg total) by mouth 2 (two) times daily. 12/11/21  Yes Thornton Park, MD  ezetimibe (ZETIA) 10 MG tablet TAKE 1 TABLET BY MOUTH DAILY 07/02/21  Yes Gollan, Kathlene November, MD  FLUoxetine (PROZAC) 10 MG capsule TAKE 1 CAPSULE BY MOUTH DAILY 12/07/21  Yes Karamalegos, Devonne Doughty, DO  fluticasone (FLONASE) 50 MCG/ACT nasal spray Place 2 sprays into both nostrils daily. Patient taking differently: Place 2-3 sprays into both nostrils at bedtime as needed. 02/18/21  Yes Brunetta Jeans, PA-C  levothyroxine (SYNTHROID) 125 MCG tablet TAKE 1 TABLET BY MOUTH  DAILY BEFORE BREAKFAST 09/29/21  Yes Karamalegos, Devonne Doughty, DO  losartan (COZAAR) 100 MG tablet TAKE 1 TABLET BY MOUTH DAILY 08/17/21  Yes Gollan, Kathlene November, MD  metFORMIN (GLUCOPHAGE-XR) 500 MG 24 hr tablet TAKE 3 TABLETS BY MOUTH DAILY  WITH BREAKFAST 01/08/22  Yes Karamalegos, Alexander J, DO  ondansetron (ZOFRAN-ODT) 4 MG disintegrating tablet Take 4 mg by mouth every 8 (eight) hours as needed. 01/23/22  Yes [provider]  OZEMPIC, 1 MG/DOSE, 4 MG/3ML SOPN INJECT SUBCUTANEOUSLY 1 MG  WEEKLY 01/18/22  Yes Karamalegos, Alexander J, DO  rosuvastatin (CRESTOR) 20 MG tablet TAKE 1 TABLET BY MOUTH DAILY 07/28/21  Yes Gollan, Kathlene November, MD  traMADol (ULTRAM) 50 MG tablet Take 50 mg by mouth every 6 (six) hours as needed. 12/23/21  Yes [provider]  [place] test strip U UTD BID Patient not taking: Reported on 01/26/2022 11/10/16   Olin Hauser, DO  carvedilol (COREG) 3.125 MG tablet TAKE 1 TABLET BY MOUTH TWICE  DAILY Patient not taking: Reported on 01/27/2022 07/28/21   Minna Merritts, MD  fexofenadine (ALLEGRA) 180 MG tablet Take 180 mg by mouth daily as needed for allergies or rhinitis. Patient not taking: Reported on 11/30/2021    [provider]  nitroGLYCERIN (NITROSTAT) 0.4 MG SL tablet Place 1 tablet (0.4 mg total) under the tongue every 5 (five) minutes as needed for chest pain. 01/29/20   Minna Merritts, MD  oxyCODONE (OXY IR/ROXICODONE) 5 MG immediate release tablet Take 1-2 tablets (5-10 mg total) by mouth every 4 (four) hours as needed for moderate pain (pain score 4-6). Patient not taking: Reported on 01/26/2022 12/11/21   Thornton Park, MD     Family History  Problem Relation Age of Onset   Heart attack Father    Heart failure Father    Hypertension Father    Hyperlipidemia Father    Heart disease Father 44       CABG    Pulmonary fibrosis Son    Breast cancer Neg Hx     Social History   Socioeconomic History   Marital status: Widowed    Spouse name: Not on file   Number of children:  3   Years of education: Not on file   Highest education level: Not on file  Occupational History   Occupation: retired  Tobacco Use   Smoking status: Never   Smokeless tobacco: Never  Vaping Use   Vaping Use: Never used  Substance and Sexual Activity   Alcohol use: No   Drug use: No   Sexual activity: Not on file  Other Topics Concern   Not on file  Social History Narrative   Not on file   Social Determinants of Health   Financial Resource Strain: Low Risk  (03/27/2021)   Overall Financial Resource Strain (CARDIA)    Difficulty of Paying Living Expenses: Not hard at all  Food Insecurity: No Food Insecurity (01/28/2022)   Hunger Vital Sign    Worried About Running Out of Food in the Last Year: Never true    [place] in the Last Year: Never true  Transportation Needs: No Transportation Needs (01/28/2022)   PRAPARE - Hydrologist (Medical): No    Lack of Transportation (Non-Medical): No   Physical Activity: Insufficiently Active (03/27/2021)   Exercise Vital Sign    Days of Exercise per Week: 3 days    Minutes of Exercise per Session: 30 min  Stress: No Stress Concern Present (03/27/2021)   [place]    Feeling of Stress : Only a little  Social Connections: Moderately Integrated (03/27/2021)   Social Connection and Isolation Panel [NHANES]    Frequency of Communication with Friends and Family: More than three times a week    Frequency of Social Gatherings with Friends and Family: More than three times a week    Attends Religious Services: More than 4 times per year    Active Member of [employer] or Organizations: Yes    Attends Archivist Meetings: More than 4 times per year    Marital Status: Widowed     Review of Systems: A 12 point ROS discussed and pertinent positives are indicated in the HPI above.  All other systems are negative.  Review of Systems  Constitutional:  Positive for chills and fever.  Respiratory:  Negative for chest tightness and shortness of breath.   Cardiovascular:  Negative for chest pain and leg swelling.  Gastrointestinal:  Negative for abdominal pain, diarrhea, nausea and vomiting.  Neurological:  Positive for headaches. Negative for dizziness.  Psychiatric/Behavioral:  Negative for confusion.     Vital Signs: BP (!) 118/48 (BP Location: Left Arm)   Pulse 79   Temp 97.9 F (36.6 C)   Resp 16   Ht '5\' 6"'$  (1.676 m)   Wt 217 lb 2.5 oz (98.5 kg)   SpO2 99%   BMI 35.05 kg/m    Physical Exam Vitals reviewed.  Constitutional:      General: She is not in acute distress.    Appearance: She is obese. She is ill-appearing.  HENT:     Mouth/Throat:     Mouth: Mucous membranes are moist.  Cardiovascular:     Rate and Rhythm: Normal rate and regular rhythm.  Pulmonary:     Effort: Pulmonary effort is normal.     Breath sounds: Normal breath sounds.  Abdominal:      General: Bowel sounds are normal.     Palpations: Abdomen is soft.     Tenderness: There is no abdominal tenderness.  Musculoskeletal:     Right lower leg: Edema present.  Left lower leg: Edema present.     Comments: 1+ pitting edema in lower extremities bilaterally  Skin:    General: Skin is warm and dry.  Neurological:     Mental Status: She is alert and oriented to person, place, and time.  Psychiatric:        Mood and Affect: Mood normal.        Behavior: Behavior normal.     Imaging: MR ABDOMEN MRCP W WO CONTAST  Result Date: 01/27/2022 CLINICAL DATA:  Right upper quadrant abdominal pain EXAM: MRI ABDOMEN WITHOUT AND WITH CONTRAST (INCLUDING MRCP) TECHNIQUE: Multiplanar multisequence MR imaging of the abdomen was performed both before and after the administration of intravenous contrast. Heavily T2-weighted images of the biliary and pancreatic ducts were obtained, and three-dimensional MRCP images were rendered by post processing. CONTRAST:  34m GADAVIST GADOBUTROL 1 MMOL/ML IV SOLN COMPARISON:  CT scan 01/27/2022 FINDINGS: Despite efforts by the technologist and patient, motion artifact is present on today's exam and could not be eliminated. This reduces exam sensitivity and specificity. Lower chest: Unremarkable Hepatobiliary: 0.2 cm gallstone in the gallbladder with sludge and additional small gallstones noted. There is gallbladder wall thickening as well as accentuated arterial phase enhancement in the hepatic parenchyma adjacent to the gallbladder which can be associated with cholecystitis. Common bile duct 1.0 cm in diameter. I do not see a definite CBD stone, sensitivity adversely affected by motion. There is some patchy and faint accentuated T2 signal in the left hepatic lobe prior to contrast administration. Post-contrast images confirm left portal vein thrombosis for example with substantial filling defect in the left portal vein on image 605 of series 26. The right portal  vein, main portal vein, superior mesenteric vein, and splenic vein appear patent. I do not see definite tumor in the liver to indicate that the portal vein thrombosis is from a malignant cause, although clearly motion artifact adversely affects assessment. Pancreas:  Atrophic pancreas, otherwise unremarkable. Spleen:  Unremarkable Adrenals/Urinary Tract: Small benign renal cysts were no further workup. Adrenal glands unremarkable. Stomach/Bowel: Unremarkable Vascular/Lymphatic: Atherosclerosis is present, including aortoiliac atherosclerotic disease. Left portal vein thrombosis as noted above. Other:  No supplemental non-categorized findings. Musculoskeletal: Mild dextroconvex lumbar scoliosis with lumbar spondylosis and degenerative disc disease. IMPRESSION: 1. Acute left portal vein thrombosis. No definite underlying mass is identified, although sensitivity is adversely affected by motion artifact. 2. Cholelithiasis with gallbladder wall thickening and accentuated arterial phase hepatic parenchymal enhancement adjacent to the gallbladder which can be associated with acute cholecystitis. 3. Common bile duct 1.0 cm in diameter, without a definite CBD stone identified. 4. Atrophic pancreas. 5. Lumbar spondylosis and degenerative disc disease. Electronically Signed   By: WVan ClinesM.D.   On: 01/27/2022 21:13   MR 3D Recon At Scanner  Result Date: 01/27/2022 CLINICAL DATA:  Right upper quadrant abdominal pain EXAM: MRI ABDOMEN WITHOUT AND WITH CONTRAST (INCLUDING MRCP) TECHNIQUE: Multiplanar multisequence MR imaging of the abdomen was performed both before and after the administration of intravenous contrast. Heavily T2-weighted images of the biliary and pancreatic ducts were obtained, and three-dimensional MRCP images were rendered by post processing. CONTRAST:  172mGADAVIST GADOBUTROL 1 MMOL/ML IV SOLN COMPARISON:  CT scan 01/27/2022 FINDINGS: Despite efforts by the technologist and patient, motion  artifact is present on today's exam and could not be eliminated. This reduces exam sensitivity and specificity. Lower chest: Unremarkable Hepatobiliary: 0.2 cm gallstone in the gallbladder with sludge and additional small gallstones noted. There is gallbladder wall thickening  as well as accentuated arterial phase enhancement in the hepatic parenchyma adjacent to the gallbladder which can be associated with cholecystitis. Common bile duct 1.0 cm in diameter. I do not see a definite CBD stone, sensitivity adversely affected by motion. There is some patchy and faint accentuated T2 signal in the left hepatic lobe prior to contrast administration. Post-contrast images confirm left portal vein thrombosis for example with substantial filling defect in the left portal vein on image 605 of series 26. The right portal vein, main portal vein, superior mesenteric vein, and splenic vein appear patent. I do not see definite tumor in the liver to indicate that the portal vein thrombosis is from a malignant cause, although clearly motion artifact adversely affects assessment. Pancreas:  Atrophic pancreas, otherwise unremarkable. Spleen:  Unremarkable Adrenals/Urinary Tract: Small benign renal cysts were no further workup. Adrenal glands unremarkable. Stomach/Bowel: Unremarkable Vascular/Lymphatic: Atherosclerosis is present, including aortoiliac atherosclerotic disease. Left portal vein thrombosis as noted above. Other:  No supplemental non-categorized findings. Musculoskeletal: Mild dextroconvex lumbar scoliosis with lumbar spondylosis and degenerative disc disease. IMPRESSION: 1. Acute left portal vein thrombosis. No definite underlying mass is identified, although sensitivity is adversely affected by motion artifact. 2. Cholelithiasis with gallbladder wall thickening and accentuated arterial phase hepatic parenchymal enhancement adjacent to the gallbladder which can be associated with acute cholecystitis. 3. Common bile duct 1.0  cm in diameter, without a definite CBD stone identified. 4. Atrophic pancreas. 5. Lumbar spondylosis and degenerative disc disease. Electronically Signed   By: Van Clines M.D.   On: 01/27/2022 21:13   US Venous Img Lower Bilateral (DVT)  Result Date: 01/27/2022 CLINICAL DATA:  Knee replacements bilaterally in October with positive D-dimer EXAM: BILATERAL LOWER EXTREMITY VENOUS DOPPLER ULTRASOUND TECHNIQUE: Gray-scale sonography with graded compression, as well as color Doppler and duplex ultrasound were performed to evaluate the lower extremity deep venous systems from the level of the common femoral vein and including the common femoral, femoral, profunda femoral, popliteal and calf veins including the posterior tibial, peroneal and gastrocnemius veins when visible. The superficial great saphenous vein was also interrogated. Spectral Doppler was utilized to evaluate flow at rest and with distal augmentation maneuvers in the common femoral, femoral and popliteal veins. COMPARISON:  None Available. FINDINGS: RIGHT LOWER EXTREMITY Common Femoral Vein: No evidence of thrombus. Normal compressibility, respiratory phasicity and response to augmentation. Saphenofemoral Junction: No evidence of thrombus. Normal compressibility and flow on color Doppler imaging. Profunda Femoral Vein: No evidence of thrombus. Normal compressibility and flow on color Doppler imaging. Femoral Vein: No evidence of thrombus. Normal compressibility, respiratory phasicity and response to augmentation. Popliteal Vein: No evidence of thrombus. Normal compressibility, respiratory phasicity and response to augmentation. Calf Veins: No evidence of thrombus. Normal compressibility and flow on color Doppler imaging. Superficial Great Saphenous Vein: No evidence of thrombus. Normal compressibility. Venous Reflux:  None. Other Findings:  None. LEFT LOWER EXTREMITY Common Femoral Vein: No evidence of thrombus. Normal compressibility,  respiratory phasicity and response to augmentation. Saphenofemoral Junction: No evidence of thrombus. Normal compressibility and flow on color Doppler imaging. Profunda Femoral Vein: No evidence of thrombus. Normal compressibility and flow on color Doppler imaging. Femoral Vein: No evidence of thrombus. Normal compressibility, respiratory phasicity and response to augmentation. Popliteal Vein: No evidence of thrombus. Normal compressibility, respiratory phasicity and response to augmentation. Calf Veins: The posterior tibial vein is occluded at the mid calf due to acute DVT. Superficial Great Saphenous Vein: No evidence of thrombus. Normal compressibility. Venous Reflux:  None. Other  Findings:  None. IMPRESSION: Acute occlusive DVT in the left posterior tibial vein at the mid calf. No DVT on the right. These results were called by telephone at the time of interpretation on 01/27/2022 at 6:35 pm to provider Ivor Costa , who verbally acknowledged these results. Electronically Signed   By: Placido Sou M.D.   On: 01/27/2022 18:36   US ABDOMEN LIMITED RUQ (LIVER/GB)  Result Date: 01/27/2022 CLINICAL DATA:  Right upper quadrant abdominal pain EXAM: ULTRASOUND ABDOMEN LIMITED RIGHT UPPER QUADRANT COMPARISON:  CT same day FINDINGS: Gallbladder: Gallbladder wall thickening up to 9 mm. No Murphy sign. 2.6 cm gallstone at the gallbladder neck. Findings worrisome for acute cholecystitis despite the absence of a Murphy sign. Common bile duct: Diameter: Dilated to 12 mm.  No visible ductal stone. Liver: Normal appearance of the liver parenchyma itself. Portal vein is patent on color Doppler imaging with normal direction of blood flow towards the liver. Other: None. IMPRESSION: 1. 2.6 cm gallstone at the gallbladder neck with gallbladder wall thickening. No Murphy sign. Findings worrisome for acute cholecystitis, despite the absence of a demonstrable Murphy sign. 2. Dilated common bile duct to 12 mm. No visible ductal  stone. Electronically Signed   By: Nelson Chimes M.D.   On: 01/27/2022 14:41   CT Angio Chest PE W and/or Wo Contrast  Result Date: 01/27/2022 CLINICAL DATA:  Pulmonary embolism (PE) suspected, high prob; Abdominal pain, acute, nonlocalized EXAM: CT ANGIOGRAPHY CHEST CT ABDOMEN AND PELVIS WITH CONTRAST TECHNIQUE: Multidetector CT imaging of the chest was performed using the standard protocol during bolus administration of intravenous contrast. Multiplanar CT image reconstructions and MIPs were obtained to evaluate the vascular anatomy. Multidetector CT imaging of the abdomen and pelvis was performed using the standard protocol during bolus administration of intravenous contrast. RADIATION DOSE REDUCTION: This exam was performed according to the departmental dose-optimization program which includes automated exposure control, adjustment of the mA and/or kV according to patient size and/or use of iterative reconstruction technique. CONTRAST:  39m OMNIPAQUE IOHEXOL 350 MG/ML SOLN COMPARISON:  None Available. FINDINGS: CTA CHEST FINDINGS Cardiovascular: Satisfactory opacification of the pulmonary arteries to the segmental level. No evidence of pulmonary embolism. Pulmonary trunk measures 3.5 cm in diameter. Thoracic aorta is nonaneurysmal. Scattered atherosclerotic vascular calcifications of the aorta and coronary arteries. Heart size is upper limits of normal. No pericardial effusion. Mediastinum/Nodes: No enlarged mediastinal, hilar, or axillary lymph nodes. Thyroid gland, trachea, and esophagus demonstrate no significant findings. Lungs/Pleura: Trace layering bilateral pleural effusions. Mild bibasilar subsegmental atelectasis. No pneumothorax. Musculoskeletal: No chest wall abnormality. No acute or significant osseous findings. Degenerative thoracic spondylosis. Review of the MIP images confirms the above findings. CT ABDOMEN and PELVIS FINDINGS Hepatobiliary: 4.4 cm stone in the region of the gallbladder neck.  Numerous small stones and/or sludge layering within the gallbladder fundus. Gallbladder is moderately distended with diffuse gallbladder wall thickening. No focal liver lesion is identified. There is mild intra and extrahepatic biliary ductal dilatation. Pancreas: Fatty atrophy of the pancreas. No focal pancreatic lesion. No ductal dilatation or inflammatory changes. Spleen: Normal in size without focal abnormality. Adrenals/Urinary Tract: Unremarkable adrenal glands. Kidneys enhance symmetrically without solid lesion, stone, or hydronephrosis. Ureters are nondilated. Urinary bladder appears unremarkable for the degree of distention. Stomach/Bowel: Stomach is within normal limits. Appendix appears normal. Scattered colonic diverticulosis. No evidence of bowel wall thickening, distention, or inflammatory changes. Vascular/Lymphatic: Non-enhancement of the left portal vein suspicious for portal vein thrombosis (series 2, image 26). Main portal vein  and right portal veins enhance homogeneously. Aortoiliac atherosclerosis. No abdominopelvic lymphadenopathy. Reproductive: Status post hysterectomy. No adnexal masses. Other: No free fluid. No abdominopelvic fluid collection. No pneumoperitoneum. Musculoskeletal: No acute or significant osseous findings. Degenerative lumbar spondylosis. Review of the MIP images confirms the above findings. IMPRESSION: CTA Chest: 1. No evidence of pulmonary embolism. 2. Trace layering bilateral pleural effusions with mild bibasilar subsegmental atelectasis. 3. Mildly dilated pulmonary trunk suggesting pulmonary arterial hypertension. 4. Aortic and coronary artery atherosclerosis. CT Abdomen/Pelvis: 1. Non-enhancement of the left portal vein suspicious for portal vein thrombosis. Main portal vein and right portal veins enhance homogeneously. 2. Cholelithiasis with a 4.4 cm stone in the region of the gallbladder neck. Gallbladder is moderately distended with diffuse gallbladder wall thickening  and mild intra and extrahepatic biliary ductal dilatation. Findings are concerning for acute cholecystitis. 3. Colonic diverticulosis without evidence of acute diverticulitis. 4. Aortic atherosclerosis (ICD10-I70.0). Electronically Signed   By: Davina Poke D.O.   On: 01/27/2022 13:38   CT ABDOMEN PELVIS W CONTRAST  Result Date: 01/27/2022 CLINICAL DATA:  Pulmonary embolism (PE) suspected, high prob; Abdominal pain, acute, nonlocalized EXAM: CT ANGIOGRAPHY CHEST CT ABDOMEN AND PELVIS WITH CONTRAST TECHNIQUE: Multidetector CT imaging of the chest was performed using the standard protocol during bolus administration of intravenous contrast. Multiplanar CT image reconstructions and MIPs were obtained to evaluate the vascular anatomy. Multidetector CT imaging of the abdomen and pelvis was performed using the standard protocol during bolus administration of intravenous contrast. RADIATION DOSE REDUCTION: This exam was performed according to the departmental dose-optimization program which includes automated exposure control, adjustment of the mA and/or kV according to patient size and/or use of iterative reconstruction technique. CONTRAST:  77m OMNIPAQUE IOHEXOL 350 MG/ML SOLN COMPARISON:  None Available. FINDINGS: CTA CHEST FINDINGS Cardiovascular: Satisfactory opacification of the pulmonary arteries to the segmental level. No evidence of pulmonary embolism. Pulmonary trunk measures 3.5 cm in diameter. Thoracic aorta is nonaneurysmal. Scattered atherosclerotic vascular calcifications of the aorta and coronary arteries. Heart size is upper limits of normal. No pericardial effusion. Mediastinum/Nodes: No enlarged mediastinal, hilar, or axillary lymph nodes. Thyroid gland, trachea, and esophagus demonstrate no significant findings. Lungs/Pleura: Trace layering bilateral pleural effusions. Mild bibasilar subsegmental atelectasis. No pneumothorax. Musculoskeletal: No chest wall abnormality. No acute or significant  osseous findings. Degenerative thoracic spondylosis. Review of the MIP images confirms the above findings. CT ABDOMEN and PELVIS FINDINGS Hepatobiliary: 4.4 cm stone in the region of the gallbladder neck. Numerous small stones and/or sludge layering within the gallbladder fundus. Gallbladder is moderately distended with diffuse gallbladder wall thickening. No focal liver lesion is identified. There is mild intra and extrahepatic biliary ductal dilatation. Pancreas: Fatty atrophy of the pancreas. No focal pancreatic lesion. No ductal dilatation or inflammatory changes. Spleen: Normal in size without focal abnormality. Adrenals/Urinary Tract: Unremarkable adrenal glands. Kidneys enhance symmetrically without solid lesion, stone, or hydronephrosis. Ureters are nondilated. Urinary bladder appears unremarkable for the degree of distention. Stomach/Bowel: Stomach is within normal limits. Appendix appears normal. Scattered colonic diverticulosis. No evidence of bowel wall thickening, distention, or inflammatory changes. Vascular/Lymphatic: Non-enhancement of the left portal vein suspicious for portal vein thrombosis (series 2, image 26). Main portal vein and right portal veins enhance homogeneously. Aortoiliac atherosclerosis. No abdominopelvic lymphadenopathy. Reproductive: Status post hysterectomy. No adnexal masses. Other: No free fluid. No abdominopelvic fluid collection. No pneumoperitoneum. Musculoskeletal: No acute or significant osseous findings. Degenerative lumbar spondylosis. Review of the MIP images confirms the above findings. IMPRESSION: CTA Chest: 1. No evidence of  pulmonary embolism. 2. Trace layering bilateral pleural effusions with mild bibasilar subsegmental atelectasis. 3. Mildly dilated pulmonary trunk suggesting pulmonary arterial hypertension. 4. Aortic and coronary artery atherosclerosis. CT Abdomen/Pelvis: 1. Non-enhancement of the left portal vein suspicious for portal vein thrombosis. Main portal  vein and right portal veins enhance homogeneously. 2. Cholelithiasis with a 4.4 cm stone in the region of the gallbladder neck. Gallbladder is moderately distended with diffuse gallbladder wall thickening and mild intra and extrahepatic biliary ductal dilatation. Findings are concerning for acute cholecystitis. 3. Colonic diverticulosis without evidence of acute diverticulitis. 4. Aortic atherosclerosis (ICD10-I70.0). Electronically Signed   By: Davina Poke D.O.   On: 01/27/2022 13:38   DG Chest 2 View  Result Date: 01/27/2022 CLINICAL DATA:  Fevers EXAM: CHEST - 2 VIEW COMPARISON:  08/03/16 CXR FINDINGS: No pleural effusion. No pneumothorax. Normal cardiac and mediastinal contours. No displaced rib fractures. Degenerative changes of the bilateral glenohumeral joints, progressed on the right compared to 2018. Vertebral body heights are maintained. Rounded densities in the upper abdomen only visualized on the lateral view could represent a combination of renal and gallstones. IMPRESSION: 1.  No active cardiopulmonary disease. 2.  Possible small left sided renal stone. Electronically Signed   By: Marin Roberts M.D.   On: 01/27/2022 10:06    Labs:  CBC: Recent Labs    11/30/21 1000 12/11/21 0647 01/27/22 0942 01/28/22 0322  WBC 5.9 9.9 6.4 16.4*  HGB 11.7* 9.4* 9.6* 9.3*  HCT 37.6 29.4* 30.1* 28.8*  PLT 199 178 118* 112*    COAGS: Recent Labs    11/30/21 1000 01/27/22 1446  INR 1.1 1.1  APTT  --  42*    BMP: Recent Labs    11/30/21 1000 12/11/21 0647 01/27/22 0942 01/28/22 0322  NA 137 136 132* 133*  K 4.1 4.1 3.6 3.7  CL 105 108 102 104  CO2 27 21* 23 20*  GLUCOSE 122* 229* 133* 113*  BUN 18 19 45* 35*  CALCIUM 9.0 8.1* 7.5* 7.5*  CREATININE 0.91 0.93 1.32* 1.21*  GFRNONAA >60 >60 42* 46*    LIVER FUNCTION TESTS: Recent Labs    01/27/22 0942  BILITOT 0.6  AST 35  ALT 20  ALKPHOS 115  PROT 6.0*  ALBUMIN 2.3*    TUMOR MARKERS: No results for input(s):  "AFPTM", "CEA", "CA199", "CHROMGRNA" in the last 8760 hours.  Assessment and Plan:  [name] is a 76 yo female with complex PMH being seen today for image-guided percutaneous cholecystostomy tube placement secondary to cholecystitis. Patient was admitted to [facility] on 11/29 with NSTEMI and cholecystitis. Imaging revealed moderate distension of gallbladder with diffuse gallbladder wall thickening, as well as the presence of a 4.4cm stone in the gallbladder neck. Case has been reviewed and approved by Dr Earleen Newport for image-guided percutaneous cholecystostomy tube placement on 01/28/22. Heparin drip halted for 1-hour prior to procedure.  Risks and benefits discussed with the patient including, but not limited to bleeding, infection, gallbladder perforation, bile leak, sepsis or even death.  All of the patient's questions were answered, patient is agreeable to proceed. Consent signed and in chart.   Thank you for this interesting consult.  I greatly enjoyed meeting EULENE PEKAR and look forward to participating in their care.  A copy of this report was sent to the requesting provider on this date.  Electronically Signed: Lura Em, PA-C 01/28/2022, 9:14 AM   I spent a total of 40 Minutes    in face to  face in clinical consultation, greater than 50% of which was counseling/coordinating care for image-guided percutaneous cholecystostomy tube placement

## 2022-01-28 NOTE — Progress Notes (Addendum)
[facility] NAME: [name]    MR#:  [id number]  DATE OF BIRTH:  Jan 22, 1946  SUBJECTIVE:  daughter at bedside patient came into the emergency room with fever and nausea vomiting for 3 to 4 days. She was found to have acute cholecystitis, elevated troponin. Patient was started on IV heparin drip and IV antibiotic. She is status post gallbladder drain placement by interventional radiology. Currently having pain at the drain site. No vomiting.   VITALS:  Blood pressure (!) 112/47, pulse 75, temperature 98 F (36.7 C), temperature source Oral, resp. rate 17, height '5\' 6"'$  (1.676 m), weight 98.5 kg, SpO2 100 %.  PHYSICAL EXAMINATION:   GENERAL:  76 y.o.-year-old patient lying in the bed with no acute distress. Obese LUNGS: Normal breath sounds bilaterally, no wheezing CARDIOVASCULAR: S1, S2 normal. No murmurs,   ABDOMEN: Soft, tender, nondistended.right sided GB drain+ EXTREMITIES: No  edema b/l.    NEUROLOGIC: nonfocal  patient is alert and awake SKIN: No obvious rash, lesion, or ulcer.   LABORATORY PANEL:  CBC Recent Labs  Lab 01/28/22 0322  WBC 16.4*  HGB 9.3*  HCT 28.8*  PLT 112*    Chemistries  Recent Labs  Lab 01/27/22 0942 01/28/22 0322  NA 132* 133*  K 3.6 3.7  CL 102 104  CO2 23 20*  GLUCOSE 133* 113*  BUN 45* 35*  CREATININE 1.32* 1.21*  CALCIUM 7.5* 7.5*  AST 35  --   ALT 20  --   ALKPHOS 115  --   BILITOT 0.6  --    Cardiac Enzymes No results for input(s): "TROPONINI" in the last 168 hours. RADIOLOGY:  MR ABDOMEN MRCP W WO CONTAST  Result Date: 01/27/2022 CLINICAL DATA:  Right upper quadrant abdominal pain EXAM: MRI ABDOMEN WITHOUT AND WITH CONTRAST (INCLUDING MRCP) TECHNIQUE: Multiplanar multisequence MR imaging of the abdomen was performed both before and after the administration of intravenous contrast. Heavily T2-weighted images of the biliary and pancreatic ducts were obtained, and  three-dimensional MRCP images were rendered by post processing. CONTRAST:  84m GADAVIST GADOBUTROL 1 MMOL/ML IV SOLN COMPARISON:  CT scan 01/27/2022 FINDINGS: Despite efforts by the technologist and patient, motion artifact is present on today's exam and could not be eliminated. This reduces exam sensitivity and specificity. Lower chest: Unremarkable Hepatobiliary: 0.2 cm gallstone in the gallbladder with sludge and additional small gallstones noted. There is gallbladder wall thickening as well as accentuated arterial phase enhancement in the hepatic parenchyma adjacent to the gallbladder which can be associated with cholecystitis. Common bile duct 1.0 cm in diameter. I do not see a definite CBD stone, sensitivity adversely affected by motion. There is some patchy and faint accentuated T2 signal in the left hepatic lobe prior to contrast administration. Post-contrast images confirm left portal vein thrombosis for example with substantial filling defect in the left portal vein on image 605 of series 26. The right portal vein, main portal vein, superior mesenteric vein, and splenic vein appear patent. I do not see definite tumor in the liver to indicate that the portal vein thrombosis is from a malignant cause, although clearly motion artifact adversely affects assessment. Pancreas:  Atrophic pancreas, otherwise unremarkable. Spleen:  Unremarkable Adrenals/Urinary Tract: Small benign renal cysts were no further workup. Adrenal glands unremarkable. Stomach/Bowel: Unremarkable Vascular/Lymphatic: Atherosclerosis is present, including aortoiliac atherosclerotic disease. Left portal vein thrombosis as noted above. Other:  No supplemental non-categorized findings. Musculoskeletal: Mild dextroconvex lumbar scoliosis with lumbar spondylosis  and degenerative disc disease. IMPRESSION: 1. Acute left portal vein thrombosis. No definite underlying mass is identified, although sensitivity is adversely affected by motion artifact.  2. Cholelithiasis with gallbladder wall thickening and accentuated arterial phase hepatic parenchymal enhancement adjacent to the gallbladder which can be associated with acute cholecystitis. 3. Common bile duct 1.0 cm in diameter, without a definite CBD stone identified. 4. Atrophic pancreas. 5. Lumbar spondylosis and degenerative disc disease. Electronically Signed   By: Van Clines M.D.   On: 01/27/2022 21:13   MR 3D Recon At Scanner  Result Date: 01/27/2022 CLINICAL DATA:  Right upper quadrant abdominal pain EXAM: MRI ABDOMEN WITHOUT AND WITH CONTRAST (INCLUDING MRCP) TECHNIQUE: Multiplanar multisequence MR imaging of the abdomen was performed both before and after the administration of intravenous contrast. Heavily T2-weighted images of the biliary and pancreatic ducts were obtained, and three-dimensional MRCP images were rendered by post processing. CONTRAST:  22m GADAVIST GADOBUTROL 1 MMOL/ML IV SOLN COMPARISON:  CT scan 01/27/2022 FINDINGS: Despite efforts by the technologist and patient, motion artifact is present on today's exam and could not be eliminated. This reduces exam sensitivity and specificity. Lower chest: Unremarkable Hepatobiliary: 0.2 cm gallstone in the gallbladder with sludge and additional small gallstones noted. There is gallbladder wall thickening as well as accentuated arterial phase enhancement in the hepatic parenchyma adjacent to the gallbladder which can be associated with cholecystitis. Common bile duct 1.0 cm in diameter. I do not see a definite CBD stone, sensitivity adversely affected by motion. There is some patchy and faint accentuated T2 signal in the left hepatic lobe prior to contrast administration. Post-contrast images confirm left portal vein thrombosis for example with substantial filling defect in the left portal vein on image 605 of series 26. The right portal vein, main portal vein, superior mesenteric vein, and splenic vein appear patent. I do not see  definite tumor in the liver to indicate that the portal vein thrombosis is from a malignant cause, although clearly motion artifact adversely affects assessment. Pancreas:  Atrophic pancreas, otherwise unremarkable. Spleen:  Unremarkable Adrenals/Urinary Tract: Small benign renal cysts were no further workup. Adrenal glands unremarkable. Stomach/Bowel: Unremarkable Vascular/Lymphatic: Atherosclerosis is present, including aortoiliac atherosclerotic disease. Left portal vein thrombosis as noted above. Other:  No supplemental non-categorized findings. Musculoskeletal: Mild dextroconvex lumbar scoliosis with lumbar spondylosis and degenerative disc disease. IMPRESSION: 1. Acute left portal vein thrombosis. No definite underlying mass is identified, although sensitivity is adversely affected by motion artifact. 2. Cholelithiasis with gallbladder wall thickening and accentuated arterial phase hepatic parenchymal enhancement adjacent to the gallbladder which can be associated with acute cholecystitis. 3. Common bile duct 1.0 cm in diameter, without a definite CBD stone identified. 4. Atrophic pancreas. 5. Lumbar spondylosis and degenerative disc disease. Electronically Signed   By: WVan ClinesM.D.   On: 01/27/2022 21:13   [place]Venous Img Lower Bilateral (DVT)  Result Date: 01/27/2022 CLINICAL DATA:  Knee replacements bilaterally in October with positive D-dimer EXAM: BILATERAL LOWER EXTREMITY VENOUS DOPPLER ULTRASOUND TECHNIQUE: Gray-scale sonography with graded compression, as well as color Doppler and duplex ultrasound were performed to evaluate the lower extremity deep venous systems from the level of the common femoral vein and including the common femoral, femoral, profunda femoral, popliteal and calf veins including the posterior tibial, peroneal and gastrocnemius veins when visible. The superficial great saphenous vein was also interrogated. Spectral Doppler was utilized to evaluate flow at rest and with  distal augmentation maneuvers in the common femoral, femoral and popliteal veins. COMPARISON:  None Available. FINDINGS: RIGHT LOWER EXTREMITY Common Femoral Vein: No evidence of thrombus. Normal compressibility, respiratory phasicity and response to augmentation. Saphenofemoral Junction: No evidence of thrombus. Normal compressibility and flow on color Doppler imaging. Profunda Femoral Vein: No evidence of thrombus. Normal compressibility and flow on color Doppler imaging. Femoral Vein: No evidence of thrombus. Normal compressibility, respiratory phasicity and response to augmentation. Popliteal Vein: No evidence of thrombus. Normal compressibility, respiratory phasicity and response to augmentation. Calf Veins: No evidence of thrombus. Normal compressibility and flow on color Doppler imaging. Superficial Great Saphenous Vein: No evidence of thrombus. Normal compressibility. Venous Reflux:  None. Other Findings:  None. LEFT LOWER EXTREMITY Common Femoral Vein: No evidence of thrombus. Normal compressibility, respiratory phasicity and response to augmentation. Saphenofemoral Junction: No evidence of thrombus. Normal compressibility and flow on color Doppler imaging. Profunda Femoral Vein: No evidence of thrombus. Normal compressibility and flow on color Doppler imaging. Femoral Vein: No evidence of thrombus. Normal compressibility, respiratory phasicity and response to augmentation. Popliteal Vein: No evidence of thrombus. Normal compressibility, respiratory phasicity and response to augmentation. Calf Veins: The posterior tibial vein is occluded at the mid calf due to acute DVT. Superficial Great Saphenous Vein: No evidence of thrombus. Normal compressibility. Venous Reflux:  None. Other Findings:  None. IMPRESSION: Acute occlusive DVT in the left posterior tibial vein at the mid calf. No DVT on the right. These results were called by telephone at the time of interpretation on 01/27/2022 at 6:35 pm to provider  Ivor Costa , who verbally acknowledged these results. Electronically Signed   By: Placido Sou M.D.   On: 01/27/2022 18:36   US ABDOMEN LIMITED RUQ (LIVER/GB)  Result Date: 01/27/2022 CLINICAL DATA:  Right upper quadrant abdominal pain EXAM: ULTRASOUND ABDOMEN LIMITED RIGHT UPPER QUADRANT COMPARISON:  CT same day FINDINGS: Gallbladder: Gallbladder wall thickening up to 9 mm. No Murphy sign. 2.6 cm gallstone at the gallbladder neck. Findings worrisome for acute cholecystitis despite the absence of a Murphy sign. Common bile duct: Diameter: Dilated to 12 mm.  No visible ductal stone. Liver: Normal appearance of the liver parenchyma itself. Portal vein is patent on color Doppler imaging with normal direction of blood flow towards the liver. Other: None. IMPRESSION: 1. 2.6 cm gallstone at the gallbladder neck with gallbladder wall thickening. No Murphy sign. Findings worrisome for acute cholecystitis, despite the absence of a demonstrable Murphy sign. 2. Dilated common bile duct to 12 mm. No visible ductal stone. Electronically Signed   By: Nelson Chimes M.D.   On: 01/27/2022 14:41   CT Angio Chest PE W and/or Wo Contrast  Result Date: 01/27/2022 CLINICAL DATA:  Pulmonary embolism (PE) suspected, high prob; Abdominal pain, acute, nonlocalized EXAM: CT ANGIOGRAPHY CHEST CT ABDOMEN AND PELVIS WITH CONTRAST TECHNIQUE: Multidetector CT imaging of the chest was performed using the standard protocol during bolus administration of intravenous contrast. Multiplanar CT image reconstructions and MIPs were obtained to evaluate the vascular anatomy. Multidetector CT imaging of the abdomen and pelvis was performed using the standard protocol during bolus administration of intravenous contrast. RADIATION DOSE REDUCTION: This exam was performed according to the departmental dose-optimization program which includes automated exposure control, adjustment of the mA and/or kV according to patient size and/or use of iterative  reconstruction technique. CONTRAST:  55m OMNIPAQUE IOHEXOL 350 MG/ML SOLN COMPARISON:  None Available. FINDINGS: CTA CHEST FINDINGS Cardiovascular: Satisfactory opacification of the pulmonary arteries to the segmental level. No evidence of pulmonary embolism. Pulmonary trunk measures 3.5 cm in diameter. Thoracic  aorta is nonaneurysmal. Scattered atherosclerotic vascular calcifications of the aorta and coronary arteries. Heart size is upper limits of normal. No pericardial effusion. Mediastinum/Nodes: No enlarged mediastinal, hilar, or axillary lymph nodes. Thyroid gland, trachea, and esophagus demonstrate no significant findings. Lungs/Pleura: Trace layering bilateral pleural effusions. Mild bibasilar subsegmental atelectasis. No pneumothorax. Musculoskeletal: No chest wall abnormality. No acute or significant osseous findings. Degenerative thoracic spondylosis. Review of the MIP images confirms the above findings. CT ABDOMEN and PELVIS FINDINGS Hepatobiliary: 4.4 cm stone in the region of the gallbladder neck. Numerous small stones and/or sludge layering within the gallbladder fundus. Gallbladder is moderately distended with diffuse gallbladder wall thickening. No focal liver lesion is identified. There is mild intra and extrahepatic biliary ductal dilatation. Pancreas: Fatty atrophy of the pancreas. No focal pancreatic lesion. No ductal dilatation or inflammatory changes. Spleen: Normal in size without focal abnormality. Adrenals/Urinary Tract: Unremarkable adrenal glands. Kidneys enhance symmetrically without solid lesion, stone, or hydronephrosis. Ureters are nondilated. Urinary bladder appears unremarkable for the degree of distention. Stomach/Bowel: Stomach is within normal limits. Appendix appears normal. Scattered colonic diverticulosis. No evidence of bowel wall thickening, distention, or inflammatory changes. Vascular/Lymphatic: Non-enhancement of the left portal vein suspicious for portal vein thrombosis  (series 2, image 26). Main portal vein and right portal veins enhance homogeneously. Aortoiliac atherosclerosis. No abdominopelvic lymphadenopathy. Reproductive: Status post hysterectomy. No adnexal masses. Other: No free fluid. No abdominopelvic fluid collection. No pneumoperitoneum. Musculoskeletal: No acute or significant osseous findings. Degenerative lumbar spondylosis. Review of the MIP images confirms the above findings. IMPRESSION: CTA Chest: 1. No evidence of pulmonary embolism. 2. Trace layering bilateral pleural effusions with mild bibasilar subsegmental atelectasis. 3. Mildly dilated pulmonary trunk suggesting pulmonary arterial hypertension. 4. Aortic and coronary artery atherosclerosis. CT Abdomen/Pelvis: 1. Non-enhancement of the left portal vein suspicious for portal vein thrombosis. Main portal vein and right portal veins enhance homogeneously. 2. Cholelithiasis with a 4.4 cm stone in the region of the gallbladder neck. Gallbladder is moderately distended with diffuse gallbladder wall thickening and mild intra and extrahepatic biliary ductal dilatation. Findings are concerning for acute cholecystitis. 3. Colonic diverticulosis without evidence of acute diverticulitis. 4. Aortic atherosclerosis (ICD10-I70.0). Electronically Signed   By: Davina Poke D.O.   On: 01/27/2022 13:38   CT ABDOMEN PELVIS W CONTRAST  Result Date: 01/27/2022 CLINICAL DATA:  Pulmonary embolism (PE) suspected, high prob; Abdominal pain, acute, nonlocalized EXAM: CT ANGIOGRAPHY CHEST CT ABDOMEN AND PELVIS WITH CONTRAST TECHNIQUE: Multidetector CT imaging of the chest was performed using the standard protocol during bolus administration of intravenous contrast. Multiplanar CT image reconstructions and MIPs were obtained to evaluate the vascular anatomy. Multidetector CT imaging of the abdomen and pelvis was performed using the standard protocol during bolus administration of intravenous contrast. RADIATION DOSE REDUCTION:  This exam was performed according to the departmental dose-optimization program which includes automated exposure control, adjustment of the mA and/or kV according to patient size and/or use of iterative reconstruction technique. CONTRAST:  24m OMNIPAQUE IOHEXOL 350 MG/ML SOLN COMPARISON:  None Available. FINDINGS: CTA CHEST FINDINGS Cardiovascular: Satisfactory opacification of the pulmonary arteries to the segmental level. No evidence of pulmonary embolism. Pulmonary trunk measures 3.5 cm in diameter. Thoracic aorta is nonaneurysmal. Scattered atherosclerotic vascular calcifications of the aorta and coronary arteries. Heart size is upper limits of normal. No pericardial effusion. Mediastinum/Nodes: No enlarged mediastinal, hilar, or axillary lymph nodes. Thyroid gland, trachea, and esophagus demonstrate no significant findings. Lungs/Pleura: Trace layering bilateral pleural effusions. Mild bibasilar subsegmental atelectasis. No pneumothorax. Musculoskeletal:  No chest wall abnormality. No acute or significant osseous findings. Degenerative thoracic spondylosis. Review of the MIP images confirms the above findings. CT ABDOMEN and PELVIS FINDINGS Hepatobiliary: 4.4 cm stone in the region of the gallbladder neck. Numerous small stones and/or sludge layering within the gallbladder fundus. Gallbladder is moderately distended with diffuse gallbladder wall thickening. No focal liver lesion is identified. There is mild intra and extrahepatic biliary ductal dilatation. Pancreas: Fatty atrophy of the pancreas. No focal pancreatic lesion. No ductal dilatation or inflammatory changes. Spleen: Normal in size without focal abnormality. Adrenals/Urinary Tract: Unremarkable adrenal glands. Kidneys enhance symmetrically without solid lesion, stone, or hydronephrosis. Ureters are nondilated. Urinary bladder appears unremarkable for the degree of distention. Stomach/Bowel: Stomach is within normal limits. Appendix appears normal.  Scattered colonic diverticulosis. No evidence of bowel wall thickening, distention, or inflammatory changes. Vascular/Lymphatic: Non-enhancement of the left portal vein suspicious for portal vein thrombosis (series 2, image 26). Main portal vein and right portal veins enhance homogeneously. Aortoiliac atherosclerosis. No abdominopelvic lymphadenopathy. Reproductive: Status post hysterectomy. No adnexal masses. Other: No free fluid. No abdominopelvic fluid collection. No pneumoperitoneum. Musculoskeletal: No acute or significant osseous findings. Degenerative lumbar spondylosis. Review of the MIP images confirms the above findings. IMPRESSION: CTA Chest: 1. No evidence of pulmonary embolism. 2. Trace layering bilateral pleural effusions with mild bibasilar subsegmental atelectasis. 3. Mildly dilated pulmonary trunk suggesting pulmonary arterial hypertension. 4. Aortic and coronary artery atherosclerosis. CT Abdomen/Pelvis: 1. Non-enhancement of the left portal vein suspicious for portal vein thrombosis. Main portal vein and right portal veins enhance homogeneously. 2. Cholelithiasis with a 4.4 cm stone in the region of the gallbladder neck. Gallbladder is moderately distended with diffuse gallbladder wall thickening and mild intra and extrahepatic biliary ductal dilatation. Findings are concerning for acute cholecystitis. 3. Colonic diverticulosis without evidence of acute diverticulitis. 4. Aortic atherosclerosis (ICD10-I70.0). Electronically Signed   By: Davina Poke D.O.   On: 01/27/2022 13:38   DG Chest 2 View  Result Date: 01/27/2022 CLINICAL DATA:  Fevers EXAM: CHEST - 2 VIEW COMPARISON:  08/03/16 CXR FINDINGS: No pleural effusion. No pneumothorax. Normal cardiac and mediastinal contours. No displaced rib fractures. Degenerative changes of the bilateral glenohumeral joints, progressed on the right compared to 2018. Vertebral body heights are maintained. Rounded densities in the upper abdomen only  visualized on the lateral view could represent a combination of renal and gallstones. IMPRESSION: 1.  No active cardiopulmonary disease. 2.  Possible small left sided renal stone. Electronically Signed   By: Marin Roberts M.D.   On: 01/27/2022 10:06    Assessment and Plan   YARELIZ THORSTENSON is a 76 y.o. female with medical history significant of CAD with stent placement, HLD, DM, hypothyroidism, depression, OSA, endometrial cancer, diastolic CHF, recent knee replacement, CKD-3, who presents with fever, nausea, vomiting.  Patient states that she has fever and chills for 4.5 days.  She had temperature 100.8 at home today.    CTA Chest: 1. No evidence of pulmonary embolism. 2. Trace layering bilateral pleural effusions with mild bibasilar subsegmental atelectasis. 3. Mildly dilated pulmonary trunk suggesting pulmonary arterial hypertension. 4. Aortic and coronary artery atherosclerosis.   CT Abdomen/Pelvis:  1. Non-enhancement of the left portal vein suspicious for portal vein thrombosis. Main portal vein and right portal veins enhance homogeneously. 2. Cholelithiasis with a 4.4 cm stone in the region of the gallbladder neck. Gallbladder is moderately distended with diffuse gallbladder wall thickening and mild intra and extrahepatic biliary ductal dilatation. Findings are concerning for  acute cholecystitis. 3. Colonic diverticulosis without evidence of acute diverticulitis. 4. Aortic atherosclerosis (ICD10-I70.0).   US-RUQ 1. 2.6 cm gallstone at the gallbladder neck with gallbladder wall thickening. No Murphy sign. Findings worrisome for acute cholecystitis, despite the absence of a demonstrable Murphy sign. 2. Dilated common bile duct to 12 mm. No visible ductal stone.   LE venous doppler: Acute occlusive DVT in the left posterior tibial vein at the mid calf.  No DVT on the right.       Radiological Exams on Admission:  Imaging Results (Last 48 hours)  US Venous Img Lower  Bilateral (DVT)   Result Date: 01/27/2022 CLINICAL DATA:  Knee replacements bilaterally in October with positive D-dimer EXAM: BILATERAL LOWER EXTREMITY VENOUS DOPPLER ULTRASOUND TECHNIQUE: Gray-scale sonography with graded compression, as well as color Doppler and duplex ultrasound were performed to evaluate the lower extremity deep venous systems from the level of the common femoral vein and including the common femoral, femoral, profunda femoral, popliteal and calf veins including the posterior tibial, peroneal and gastrocnemius veins when visible. The superficial great saphenous vein was also interrogated. Spectral Doppler was utilized to evaluate flow at rest and with distal augmentation maneuvers in the common femoral, femoral and popliteal veins. COMPARISON:  None Available. FINDINGS: RIGHT LOWER EXTREMITY Common Femoral Vein: No evidence of thrombus. Normal compressibility, respiratory phasicity and response to augmentation. Saphenofemoral Junction: No evidence of thrombus. Normal compressibility and flow on color Doppler imaging. Profunda Femoral Vein: No evidence of thrombus. Normal compressibility and flow on color Doppler imaging. Femoral Vein: No evidence of thrombus. Normal compressibility, respiratory phasicity and response to augmentation. Popliteal Vein: No evidence of thrombus. Normal compressibility, respiratory phasicity and response to augmentation. Calf Veins: No evidence of thrombus. Normal compressibility and flow on color Doppler imaging. Superficial Great Saphenous Vein: No evidence of thrombus. Normal compressibility. Venous Reflux:  None. Other Findings:  None. LEFT LOWER EXTREMITY Common Femoral Vein: No evidence of thrombus. Normal compressibility, respiratory phasicity and response to augmentation. Saphenofemoral Junction: No evidence of thrombus. Normal compressibility and flow on color Doppler imaging. Profunda Femoral Vein: No evidence of thrombus. Normal compressibility and flow  on color Doppler imaging. Femoral Vein: No evidence of thrombus. Normal compressibility, respiratory phasicity and response to augmentation. Popliteal Vein: No evidence of thrombus. Normal compressibility, respiratory phasicity and response to augmentation. Calf Veins: The posterior tibial vein is occluded at the mid calf due to acute DVT. Superficial Great Saphenous Vein: No evidence of thrombus. Normal compressibility. Venous Reflux:  None. Other Findings:  None. IMPRESSION: Acute occlusive DVT in the left posterior tibial vein at the mid calf. No DVT on the right. These results were called by telephone at the time of interpretation on 01/27/2022 at 6:35 pm to provider Ivor Costa , who verbally acknowledged these results. Electronically Signed   By: Placido Sou M.D.   On: 01/27/2022 18:36    US ABDOMEN LIMITED RUQ (LIVER/GB)   Result Date: 01/27/2022 CLINICAL DATA:  Right upper quadrant abdominal pain EXAM: ULTRASOUND ABDOMEN LIMITED RIGHT UPPER QUADRANT COMPARISON:  CT same day FINDINGS: Gallbladder: Gallbladder wall thickening up to 9 mm. No Murphy sign. 2.6 cm gallstone at the gallbladder neck. Findings worrisome for acute cholecystitis despite the absence of a Murphy sign. Common bile duct: Diameter: Dilated to 12 mm.  No visible ductal stone. Liver: Normal appearance of the liver parenchyma itself. Portal vein is patent on color Doppler imaging with normal direction of blood flow towards the liver. Other: None. IMPRESSION: 1. 2.6  cm gallstone at the gallbladder neck with gallbladder wall thickening. No Murphy sign. Findings worrisome for acute cholecystitis, despite the absence of a demonstrable Murphy sign. 2. Dilated common bile duct to 12 mm. No visible ductal stone. Electronically Signed   By: Nelson Chimes M.D.   On: 01/27/2022 14:41    CT Angio Chest PE W and/or Wo Contrast   Result Date: 01/27/2022 CLINICAL DATA:  Pulmonary embolism (PE) suspected, high prob; Abdominal pain, acute,  nonlocalized EXAM: CT ANGIOGRAPHY CHEST CT ABDOMEN AND PELVIS WITH CONTRAST TECHNIQUE: Multidetector CT imaging of the chest was performed using the standard protocol during bolus administration of intravenous contrast. Multiplanar CT image reconstructions and MIPs were obtained to evaluate the vascular anatomy. Multidetector CT imaging of the abdomen and pelvis was performed using the standard protocol during bolus administration of intravenous contrast. RADIATION DOSE REDUCTION: This exam was performed according to the departmental dose-optimization program which includes automated exposure control, adjustment of the mA and/or kV according to patient size and/or use of iterative reconstruction technique. CONTRAST:  40m OMNIPAQUE IOHEXOL 350 MG/ML SOLN COMPARISON:  None Available. FINDINGS: CTA CHEST FINDINGS Cardiovascular: Satisfactory opacification of the pulmonary arteries to the segmental level. No evidence of pulmonary embolism. Pulmonary trunk measures 3.5 cm in diameter. Thoracic aorta is nonaneurysmal. Scattered atherosclerotic vascular calcifications of the aorta and coronary arteries. Heart size is upper limits of normal. No pericardial effusion. Mediastinum/Nodes: No enlarged mediastinal, hilar, or axillary lymph nodes. Thyroid gland, trachea, and esophagus demonstrate no significant findings. Lungs/Pleura: Trace layering bilateral pleural effusions. Mild bibasilar subsegmental atelectasis. No pneumothorax. Musculoskeletal: No chest wall abnormality. No acute or significant osseous findings. Degenerative thoracic spondylosis. Review of the MIP images confirms the above findings. CT ABDOMEN and PELVIS FINDINGS Hepatobiliary: 4.4 cm stone in the region of the gallbladder neck. Numerous small stones and/or sludge layering within the gallbladder fundus. Gallbladder is moderately distended with diffuse gallbladder wall thickening. No focal liver lesion is identified. There is mild intra and extrahepatic  biliary ductal dilatation. Pancreas: Fatty atrophy of the pancreas. No focal pancreatic lesion. No ductal dilatation or inflammatory changes. Spleen: Normal in size without focal abnormality. Adrenals/Urinary Tract: Unremarkable adrenal glands. Kidneys enhance symmetrically without solid lesion, stone, or hydronephrosis. Ureters are nondilated. Urinary bladder appears unremarkable for the degree of distention. Stomach/Bowel: Stomach is within normal limits. Appendix appears normal. Scattered colonic diverticulosis. No evidence of bowel wall thickening, distention, or inflammatory changes. Vascular/Lymphatic: Non-enhancement of the left portal vein suspicious for portal vein thrombosis (series 2, image 26). Main portal vein and right portal veins enhance homogeneously. Aortoiliac atherosclerosis. No abdominopelvic lymphadenopathy. Reproductive: Status post hysterectomy. No adnexal masses. Other: No free fluid. No abdominopelvic fluid collection. No pneumoperitoneum. Musculoskeletal: No acute or significant osseous findings. Degenerative lumbar spondylosis. Review of the MIP images confirms the above findings. IMPRESSION: CTA Chest: 1. No evidence of pulmonary embolism. 2. Trace layering bilateral pleural effusions with mild bibasilar subsegmental atelectasis. 3. Mildly dilated pulmonary trunk suggesting pulmonary arterial hypertension. 4. Aortic and coronary artery atherosclerosis. CT Abdomen/Pelvis: 1. Non-enhancement of the left portal vein suspicious for portal vein thrombosis. Main portal vein and right portal veins enhance homogeneously. 2. Cholelithiasis with a 4.4 cm stone in the region of the gallbladder neck. Gallbladder is moderately distended with diffuse gallbladder wall thickening and mild intra and extrahepatic biliary ductal dilatation. Findings are concerning for acute cholecystitis. 3. Colonic diverticulosis without evidence of acute diverticulitis. 4. Aortic atherosclerosis (ICD10-I70.0).  Electronically Signed   By: NDavina PokeD.O.  On: 01/27/2022 13:38    CT ABDOMEN PELVIS W CONTRAST   Result Date: 01/27/2022 CLINICAL DATA:  Pulmonary embolism (PE) suspected, high prob; Abdominal pain, acute, nonlocalized EXAM: CT ANGIOGRAPHY CHEST CT ABDOMEN AND PELVIS WITH CONTRAST TECHNIQUE: Multidetector CT imaging of the chest was performed using the standard protocol during bolus administration of intravenous contrast. Multiplanar CT image reconstructions and MIPs were obtained to evaluate the vascular anatomy. Multidetector CT imaging of the abdomen and pelvis was performed using the standard protocol during bolus administration of intravenous contrast. RADIATION DOSE REDUCTION: This exam was performed according to the departmental dose-optimization program which includes automated exposure control, adjustment of the mA and/or kV according to patient size and/or use of iterative reconstruction technique. CONTRAST:  23m OMNIPAQUE IOHEXOL 350 MG/ML SOLN COMPARISON:  None Available. FINDINGS: CTA CHEST FINDINGS Cardiovascular: Satisfactory opacification of the pulmonary arteries to the segmental level. No evidence of pulmonary embolism. Pulmonary trunk measures 3.5 cm in diameter. Thoracic aorta is nonaneurysmal. Scattered atherosclerotic vascular calcifications of the aorta and coronary arteries. Heart size is upper limits of normal. No pericardial effusion. Mediastinum/Nodes: No enlarged mediastinal, hilar, or axillary lymph nodes. Thyroid gland, trachea, and esophagus demonstrate no significant findings. Lungs/Pleura: Trace layering bilateral pleural effusions. Mild bibasilar subsegmental atelectasis. No pneumothorax. Musculoskeletal: No chest wall abnormality. No acute or significant osseous findings. Degenerative thoracic spondylosis. Review of the MIP images confirms the above findings. CT ABDOMEN and PELVIS FINDINGS Hepatobiliary: 4.4 cm stone in the region of the gallbladder neck. Numerous  small stones and/or sludge layering within the gallbladder fundus. Gallbladder is moderately distended with diffuse gallbladder wall thickening. No focal liver lesion is identified. There is mild intra and extrahepatic biliary ductal dilatation. Pancreas: Fatty atrophy of the pancreas. No focal pancreatic lesion. No ductal dilatation or inflammatory changes. Spleen: Normal in size without focal abnormality. Adrenals/Urinary Tract: Unremarkable adrenal glands. Kidneys enhance symmetrically without solid lesion, stone, or hydronephrosis. Ureters are nondilated. Urinary bladder appears unremarkable for the degree of distention. Stomach/Bowel: Stomach is within normal limits. Appendix appears normal. Scattered colonic diverticulosis. No evidence of bowel wall thickening, distention, or inflammatory changes. Vascular/Lymphatic: Non-enhancement of the left portal vein suspicious for portal vein thrombosis (series 2, image 26). Main portal vein and right portal veins enhance homogeneously. Aortoiliac atherosclerosis. No abdominopelvic lymphadenopathy. Reproductive: Status post hysterectomy. No adnexal masses. Other: No free fluid. No abdominopelvic fluid collection. No pneumoperitoneum. Musculoskeletal: No acute or significant osseous findings. Degenerative lumbar spondylosis. Review of the MIP images confirms the above findings. IMPRESSION: CTA Chest: 1. No evidence of pulmonary embolism. 2. Trace layering bilateral pleural effusions with mild bibasilar subsegmental atelectasis. 3. Mildly dilated pulmonary trunk suggesting pulmonary arterial hypertension. 4. Aortic and coronary artery atherosclerosis. CT Abdomen/Pelvis: 1. Non-enhancement of the left portal vein suspicious for portal vein thrombosis. Main portal vein and right portal veins enhance homogeneously. 2. Cholelithiasis with a 4.4 cm stone in the region of the gallbladder neck. Gallbladder is moderately distended with diffuse gallbladder wall thickening and mild  intra and extrahepatic biliary ductal dilatation. Findings are concerning for acute cholecystitis. 3. Colonic diverticulosis without evidence of acute diverticulitis. 4. Aortic atherosclerosis (ICD10-I70.0). Electronically Signed   By: NDavina PokeD.O.   On: 01/27/2022 13:38    DG Chest 2 View   Result Date: 01/27/2022 CLINICAL DATA:  Fevers EXAM: CHEST - 2 VIEW COMPARISON:  08/03/16 CXR FINDINGS: No pleural effusion. No pneumothorax. Normal cardiac and mediastinal contours. No displaced rib fractures. Degenerative changes of the bilateral glenohumeral joints, progressed on  the right compared to 2018. Vertebral body heights are maintained. Rounded densities in the upper abdomen only visualized on the lateral view could represent a combination of renal and gallstones. IMPRESSION: 1.  No active cardiopulmonary disease. 2.  Possible small left sided renal stone. Electronically Signed   By: Marin Roberts M.D.   On: 01/27/2022 10:06           Assessment/Plan Principal Problem:   NSTEMI (non-ST elevated myocardial infarction) ([place]) Active Problems:   CAD (coronary artery disease)   Cholecystitis   Controlled type 2 diabetes mellitus with diabetic nephropathy (HCC)   HTN (hypertension)   Hypothyroidism   Hyperlipidemia associated with type 2 diabetes mellitus (HCC)   Chronic diastolic CHF (congestive heart failure) (HCC)   Thrombocytopenia (HCC)   Major depressive disorder, recurrent, in remission ([place])   Obstructive sleep apnea on CPAP   Chronic kidney disease, stage 3a (HCC)   Portal vein thrombosis   Obesity (BMI 30.0-34.9)   DVT (deep venous thrombosis) (HCC)     Assessment and Plan:   NSTEMI (non-ST elevated myocardial infarction) and hx of CAD: s/p of stent. T rop  865. Consulted Dr. Garen Lah. -IV heparin -ASA, Zetia and Crestor -trend trop peaked 5000 -echo done --Last stent placed in 2014 --Myoview stress test in am   Klebsiella Sepsis Acute Cholecystitis: WBC 16.4,  temperature 98.4, heart rate 72, RR 24, does not meet criteria for sepsis.  -- Consulted Dr. Lysle Pearl of surgery--recommends GB drain placement per IR -IV Zosyn in ED).  Patient has dilated CBD.   --Consulted Dr. Haig Prophet of GI. -Blood culture 1/2 Colette Ribas -As needed morphine, Zofran, oxycodone -MRCP noted   Controlled type 2 diabetes mellitus with diabetic nephropathy [facility]): Recent A1c 6.6, well-controlled.  Patient taking metformin and Ozempic at home. -SSI   HTN (hypertension) -Hold home blood pressure medications including amlodipine, Coreg, Cozaar due to soft blood pressure -IV hydralazine as needed   Hypothyroidism -Synthroid   Hyperlipidemia associated with type 2 diabetes mellitus (HCC) -Cresto and Zetia   Chronic diastolic CHF (congestive heart failure) ([place]): Recent 2D echo 11/26/2020 showed EF of 50-55% with grade 2 diastolic dysfunction.  Patient has trace leg edema, does not seem to have CHF exacerbation. -stable   Thrombocytopenia ([place]): Platelet 118 -LDH  120 and peripheral smear plt normal morphology   Major depressive disorder, recurrent, in remission ([place]) -Continue home medications   Obstructive sleep apnea - on CPAP   Chronic kidney disease, stage 3a ([place]): Renal function is worsening than baseline.  Recent baseline creatinine 0.9 01/11/2022.  Her creatinine is 1.32, BUN 45. -IV fluid: 500 cc normal saline, then 75 cc/h -Hold Cozaar   Portal vein thrombosis -On IV heparin   Obesity (BMI 30.0-34.9): BMI= 35.99  and BW=101.2 -Diet and exercise.   -Encourage to lose weight.   Acute Left post tibial vein DVT (deep venous thrombosis) ([place]): CTA negative for PE -IV heparin    Procedures s/p GB drain placement by IR on 01/28/2022 Family communication :dter at bedside Consults : general surgery, cardiology, G.I. CODE STATUS: full code DVT Prophylaxis : heparin drip Level of care: Progressive Status is: Inpatient Remains inpatient appropriate because:  sepsis due to acute cholecystitis, non-STEMI    TOTAL TIME TAKING CARE OF THIS PATIENT: 50 minutes.  >50% time spent on counselling and coordination of care  Note: This dictation was prepared with Dragon dictation along with smaller phrase technology. Any transcriptional errors that result from this process are unintentional.  Fritzi Mandes  M.D    [facility]   CC: Primary care physician; Olin Hauser, DO

## 2022-01-29 ENCOUNTER — Other Ambulatory Visit (HOSPITAL_COMMUNITY): Payer: Self-pay

## 2022-01-29 ENCOUNTER — Other Ambulatory Visit: Payer: Medicare Other

## 2022-01-29 DIAGNOSIS — I214 Non-ST elevation (NSTEMI) myocardial infarction: Secondary | ICD-10-CM | POA: Diagnosis not present

## 2022-01-29 LAB — CBC
HCT: 33.7 % — ABNORMAL LOW (ref 36.0–46.0)
Hemoglobin: 10.7 g/dL — ABNORMAL LOW (ref 12.0–15.0)
MCH: 29.5 pg (ref 26.0–34.0)
MCHC: 31.8 g/dL (ref 30.0–36.0)
MCV: 92.8 fL (ref 80.0–100.0)
Platelets: 141 10*3/uL — ABNORMAL LOW (ref 150–400)
RBC: 3.63 MIL/uL — ABNORMAL LOW (ref 3.87–5.11)
RDW: 14.5 % (ref 11.5–15.5)
WBC: 25.5 10*3/uL — ABNORMAL HIGH (ref 4.0–10.5)
nRBC: 0 % (ref 0.0–0.2)

## 2022-01-29 LAB — BASIC METABOLIC PANEL
Anion gap: 13 (ref 5–15)
BUN: 36 mg/dL — ABNORMAL HIGH (ref 8–23)
CO2: 15 mmol/L — ABNORMAL LOW (ref 22–32)
Calcium: 7.7 mg/dL — ABNORMAL LOW (ref 8.9–10.3)
Chloride: 101 mmol/L (ref 98–111)
Creatinine, Ser: 1.46 mg/dL — ABNORMAL HIGH (ref 0.44–1.00)
GFR, Estimated: 37 mL/min — ABNORMAL LOW (ref >60)
Glucose, Bld: 213 mg/dL — ABNORMAL HIGH (ref 70–99)
Potassium: 4.3 mmol/L (ref 3.5–5.1)
Sodium: 129 mmol/L — ABNORMAL LOW (ref 135–145)

## 2022-01-29 LAB — HEPARIN LEVEL (UNFRACTIONATED)
Heparin Unfractionated: 0.28 IU/mL — ABNORMAL LOW (ref 0.30–0.70)
Heparin Unfractionated: 0.45 IU/mL (ref 0.30–0.70)

## 2022-01-29 LAB — GLUCOSE, CAPILLARY
Glucose-Capillary: 167 mg/dL — ABNORMAL HIGH (ref 70–99)
Glucose-Capillary: 184 mg/dL — ABNORMAL HIGH (ref 70–99)
Glucose-Capillary: 176 mg/dL — ABNORMAL HIGH (ref 70–99)
Glucose-Capillary: 156 mg/dL — ABNORMAL HIGH (ref 70–99)

## 2022-01-29 LAB — PROCALCITONIN: Procalcitonin: 9.14 ng/mL

## 2022-01-29 MED ORDER — SENNOSIDES-DOCUSATE SODIUM 8.6-50 MG PO TABS
2.0000 | ORAL_TABLET | Freq: Every day | ORAL | Status: AC
  Administered 2022-01-29: 2 via ORAL
  Filled 2022-01-29: qty 2

## 2022-01-29 MED ORDER — POLYETHYLENE GLYCOL 3350 17 G PO PACK
17.0000 g | PACK | Freq: Every day | ORAL | Status: DC
  Administered 2022-01-29 – 2022-02-02 (×4): 17 g via ORAL
  Filled 2022-01-29 (×7): qty 1

## 2022-01-29 MED ORDER — HEPARIN BOLUS VIA INFUSION
1250.0000 [IU] | Freq: Once | INTRAVENOUS | Status: AC
  Administered 2022-01-29: 1250 [IU] via INTRAVENOUS
  Filled 2022-01-29: qty 1250

## 2022-01-29 MED ORDER — SENNOSIDES-DOCUSATE SODIUM 8.6-50 MG PO TABS
2.0000 | ORAL_TABLET | Freq: Every evening | ORAL | Status: AC | PRN
  Administered 2022-02-01: 2 via ORAL
  Filled 2022-01-29: qty 2

## 2022-01-29 MED ORDER — KETOROLAC TROMETHAMINE 15 MG/ML IJ SOLN
15.0000 mg | Freq: Three times a day (TID) | INTRAMUSCULAR | Status: AC | PRN
  Administered 2022-01-29 – 2022-01-30 (×5): 15 mg via INTRAVENOUS
  Filled 2022-01-29 (×5): qty 1

## 2022-01-29 MED ORDER — ONDANSETRON HCL 4 MG PO TABS: 4.0000 mg | ORAL_TABLET | Freq: Three times a day (TID) | ORAL | Status: DC | PRN

## 2022-01-29 NOTE — Progress Notes (Signed)
       CROSS COVER NOTE  NAME: ESBEYDI MANAGO MRN: [id number] DOB : 04-24-1945    Date of Service   01/29/2022  HPI/Events of Note   Patient informed nurse that she is refusing her myoview test for this morning and that she wants fool and oral meds  Assessment and  Interventions   Assessment:  Plan: Continued NPO status       Kathlene Cote NP [facility]

## 2022-01-29 NOTE — Plan of Care (Signed)
Patient complained of moderate to severe pain the whole night; alternated between prn oxycodone, tylenol, & morphine. she refused ordered site dressing change, will try later. Patient also refused stress test/ myocar scheduled for this morning; education provided and overrnight NP notified. Vitals are stable. Call bell within reach.  Problem: Coping: Goal: Ability to adjust to condition or change in health will improve Outcome: Not Progressing   Problem: Coping: Goal: Level of anxiety will decrease Outcome: Not Progressing   Problem: Pain Managment: Goal: General experience of comfort will improve Outcome: Not Progressing   Problem: Education: Goal: Ability to describe self-care measures that may prevent or decrease complications (Diabetes Survival Skills Education) will improve Outcome: Progressing Goal: Individualized Educational Video(s) Outcome: Progressing   Problem: Fluid Volume: Goal: Ability to maintain a balanced intake and output will improve Outcome: Progressing   Problem: Health Behavior/Discharge Planning: Goal: Ability to identify and utilize available resources and services will improve Outcome: Progressing Goal: Ability to manage health-related needs will improve Outcome: Progressing   Problem: Metabolic: Goal: Ability to maintain appropriate glucose levels will improve Outcome: Progressing   Problem: Nutritional: Goal: Maintenance of adequate nutrition will improve Outcome: Progressing Goal: Progress toward achieving an optimal weight will improve Outcome: Progressing   Problem: Skin Integrity: Goal: Risk for impaired skin integrity will decrease Outcome: Progressing   Problem: Tissue Perfusion: Goal: Adequacy of tissue perfusion will improve Outcome: Progressing   Problem: Education: Goal: Knowledge of General Education information will improve Description: Including pain rating scale, medication(s)/side effects and non-pharmacologic comfort  measures Outcome: Progressing   Problem: Health Behavior/Discharge Planning: Goal: Ability to manage health-related needs will improve Outcome: Progressing   Problem: Clinical Measurements: Goal: Ability to maintain clinical measurements within normal limits will improve Outcome: Progressing Goal: Will remain free from infection Outcome: Progressing Goal: Diagnostic test results will improve Outcome: Progressing Goal: Respiratory complications will improve Outcome: Progressing Goal: Cardiovascular complication will be avoided Outcome: Progressing   Problem: Activity: Goal: Risk for activity intolerance will decrease Outcome: Progressing   Problem: Elimination: Goal: Will not experience complications related to bowel motility Outcome: Progressing Goal: Will not experience complications related to urinary retention Outcome: Progressing   Problem: Safety: Goal: Ability to remain free from injury will improve Outcome: Progressing   Problem: Skin Integrity: Goal: Risk for impaired skin integrity will decrease Outcome: Progressing

## 2022-01-29 NOTE — Progress Notes (Signed)
Subjective:  CC: [name] is a 76 y.o. female  Hospital stay day 2,   acute cholecystitis  HPI: Status post cholecystostomy tube placement.  Complains of drain site pain  ROS:  General: Denies weight loss, weight gain, fatigue, fevers, chills, and night sweats. Heart: Denies chest pain, palpitations, racing heart, irregular heartbeat, leg pain or swelling, and decreased activity tolerance. Respiratory: Denies breathing difficulty, shortness of breath, wheezing, cough, and sputum. GI: Denies change in appetite, heartburn, nausea, vomiting, constipation, diarrhea, and blood in stool. GU: Denies difficulty urinating, pain with urinating, urgency, frequency, blood in urine.   Objective:   Temp:  [97.6 F (36.4 C)-99 F (37.2 C)] 97.6 F (36.4 C) (12/01 0750) Pulse Rate:  [72-89] 81 (12/01 0750) Resp:  [13-20] 20 (12/01 0750) BP: (100-136)/(44-64) 128/51 (12/01 0750) SpO2:  [97 %-100 %] 100 % (12/01 0750) Weight:  [98.5 kg-103.3 kg] 103.3 kg (12/01 0422)     Height: '5\' 6"'$  (167.6 cm) Weight: 103.3 kg BMI (Calculated): 36.77   Intake/Output this shift:   Intake/Output Summary (Last 24 hours) at 01/29/2022 0815 Last data filed at 01/28/2022 1832 Gross per 24 hour  Intake 1703.77 ml  Output 250 ml  Net 1453.77 ml    Constitutional :  alert, cooperative, appears stated age, and no distress  Respiratory:  clear to auscultation bilaterally  Cardiovascular:  regular rate and rhythm  Gastrointestinal: soft, non-tender; bowel sounds normal; no masses,  no organomegaly.  Cholecystostomy tube with green bilious drainage  Skin: Cool and moist.  Tenderness to palpation at drain insertion site  Psychiatric: Normal affect, non-agitated, not confused       LABS:     Latest Ref Rng & Units 01/28/2022    3:22 AM 01/27/2022    9:42 AM 12/11/2021    6:47 AM  CMP  Glucose 70 - 99 mg/dL 113  133  229   BUN 8 - 23 mg/dL 35  45  19   Creatinine 0.44 - 1.00 mg/dL 1.21  1.32  0.93    Sodium 135 - 145 mmol/L 133  132  136   Potassium 3.5 - 5.1 mmol/L 3.7  3.6  4.1   Chloride 98 - 111 mmol/L 104  102  108   CO2 22 - 32 mmol/L '20  23  21   '$ Calcium 8.9 - 10.3 mg/dL 7.5  7.5  8.1   Total Protein 6.5 - 8.1 g/dL  6.0    Total Bilirubin 0.3 - 1.2 mg/dL  0.6    Alkaline Phos 38 - 126 U/L  115    AST 15 - 41 U/L  35    ALT 0 - 44 U/L  20        Latest Ref Rng & Units 01/28/2022    3:22 AM 01/27/2022    9:42 AM 12/11/2021    6:47 AM  CBC  WBC 4.0 - 10.5 K/uL 16.4  6.4  9.9   Hemoglobin 12.0 - 15.0 g/dL 9.3  9.6  9.4   Hematocrit 36.0 - 46.0 % 28.8  30.1  29.4   Platelets 150 - 400 K/uL 112  118  178     RADS: N/a Assessment:   Status post cholecystostomy tube placement for acute cholecystitis in the setting of NSTEMI.  Stable from cholecystitis standpoint.  Further care per hospitalist team.  Follow-up and discharge instructions per radiology team.  Patient to follow-up with me a week or so after her first radiology appointment outpatient, to discuss possible  elective cholecystectomy  labs/images/medications/previous chart entries reviewed personally and relevant changes/updates noted above.

## 2022-01-29 NOTE — Progress Notes (Signed)
ANTICOAGULATION CONSULT NOTE  Pharmacy Consult for Heparin Infusion Indication: ACS/NSTEMI  No Known Allergies  Patient Measurements: Height: '5\' 6"'$  (167.6 cm) Weight: 103.3 kg (227 lb 11.8 oz) IBW/kg (Calculated) : 59.3 Heparin Dosing Weight: 82.9 kg  Vital Signs: Temp: 97.6 F (36.4 C) (12/01 2032) Temp Source: Oral (12/01 2032) BP: 90/48 (12/01 2032) Pulse Rate: 84 (12/01 2032)  Labs: Recent Labs    01/27/22 0942 01/27/22 0942 01/27/22 1446 01/27/22 1450 01/27/22 1909 01/27/22 2242 01/28/22 0322 01/28/22 2030 01/29/22 0805 01/29/22 0822 01/29/22 1102 01/29/22 2111  HGB 9.6*  --   --   --   --   --  9.3*  --   --  10.7*  --   --   HCT 30.1*  --   --   --   --   --  28.8*  --   --  33.7*  --   --   PLT 118*  --   --   --   --   --  112*  --   --  141*  --   --   APTT  --   --  42*  --   --   --   --   --   --   --   --   --   LABPROT  --   --  14.2  --   --   --   --   --   --   --   --   --   INR  --   --  1.1  --   --   --   --   --   --   --   --   --   HEPARINUNFRC  --    < >  --   --   --  0.25*  --  0.28*  --   --  0.28* 0.45  CREATININE 1.32*  --   --   --   --   --  1.21*  --  1.46*  --   --   --   TROPONINIHS 865*  --   --    < > 3,514* 5,864* 3,344*  --   --   --   --   --    < > = values in this interval not displayed.     Estimated Creatinine Clearance: 39.8 mL/min (A) (by C-G formula based on SCr of 1.46 mg/dL (H)).   Medical History: Past Medical History:  Diagnosis Date   Coronary artery disease    a. 07/2013 STEMI/PCI ([place]): LM nl, LAD 100p (thrombectomy & 3.5x23 Alpine DES), LCX nl, RCA nl, EF 45%.   Depression    Diastolic dysfunction    a.) TTE 11/26/2020 - LVEF 50-55%; G2DD.   Endometrial cancer ([place])    Gravida 3 para 3    Hyperlipidemia    Hypertension    Hypertensive heart disease    Hypothyroid    Ischemic cardiomyopathy    a.) 07/2013 EF 45% @ time of STEMI; b.) 11/2013 Echo: EF 55-60%, mildly dil LA, nl RV fxn. c.) TTE  07/29/2015 - LVEF 55-60%, mild LA and RV dilation. d.) TTE 11/26/2020 - LVEF 50-55%; G2DD.   Menopause    Obstructive sleep apnea on CPAP    Osteoarthritis    PONV (postoperative nausea and vomiting)    ST elevation myocardial infarction (STEMI) of anterolateral wall ([place]) 08/14/2013   a.) LVEF reduced at 45%; Tx'd with trombectomy  and PCI to 100% LAD; 3.5 x 23 mm Xience Alpine DES placed.   T2DM (type 2 diabetes mellitus) ([place])    Thyroid cancer ([place])    a.) s/p total thyroidectomy. b.) on daily levothyroxine.    Medications:  Scheduled:   aspirin EC  81 mg Oral Daily   ezetimibe  10 mg Oral Daily   FLUoxetine  10 mg Oral Daily   insulin aspart  0-5 Units Subcutaneous QHS   insulin aspart  0-9 Units Subcutaneous TID WC   levothyroxine  125 mcg Oral Q0600   polyethylene glycol  17 g Oral Daily   rosuvastatin  20 mg Oral Daily   sodium chloride flush  5 mL Intracatheter Q8H   Infusions:   sodium chloride 75 mL/hr at 01/29/22 1930   heparin 1,550 Units/hr (01/29/22 2143)   piperacillin-tazobactam (ZOSYN)  IV 3.375 g (01/29/22 2132)   PRN: acetaminophen, hydrALAZINE, ketorolac, morphine injection, nitroGLYCERIN, ondansetron (ZOFRAN) IV, [START ON 01/30/2022] ondansetron, [COMPLETED] senna-docusate **FOLLOWED BY** [START ON 01/30/2022] senna-docusate  Assessment: [name] is a 76 y.o. female presenting with elevated troponins and D-Dimer. PMH significant for CAD (STEMI s/p stent 2015), HLD, HTN, DM, hypothyroidism (s/p thyroidectomy), depression, OSA on CPAP, endometrial cancer, dCHF, knee replacements (R-11/2020, L-11/2021), CKD-3 . Patient was not on [facility] PTA per chart review. [place] found acute occlusive DVT in the left posterior tibial vein at the mid calf. Pharmacy has been consulted to initiate and manage heparin infusion.   Labs for monitoring heparin infusion have been delayed on multiple occasions due to inability to draw sample.   Baseline Labs: aPTT 42, PT 14.2, INR 1.1,  Hgb 9.6, Hct 30.1, Plt 118   Goal of Therapy:  Heparin level 0.3-0.7 units/ml Monitor platelets by anticoagulation protocol: Yes    Date Time HL Rate/Comment  11/29 2242 0.25 1050/SUBtherapeutic 11/30 0830 --- 0/paused for procedure 11/30 1141 --- 1200/resumed post procedure 11/30   2030    0.28     1200/SUBtherapeutic  12/1 1102 0.28 1350/SUBtherapeutic 12/01 2111 0.45 Therapeutic x 1  Plan:  Continue heparin infusion to 1550 units/hr Recheck HL w/ AM labs to confirm Continue to monitor H&H and platelets daily while on heparin infusion   Renda Rolls, PharmD, [facility] 01/29/2022 9:56 PM

## 2022-01-29 NOTE — Progress Notes (Signed)
Pharmacy Antibiotic Note  [name] is a 76 y.o. female admitted on 01/27/2022 with Cholecystitis. PMH significant for CAD (STEMI s/p stent 2015), HLD, HTN, DM, hypothyroidism (s/p thyroidectomy), depression, OSA on CPAP, endometrial cancer, dCHF, knee replacements (R-11/2020, L-11/2021), CKD-3. CTAP with multiple findings including cholelithiasis with a 4.4 cm stone in the region of the gallbladder neck concerning for acute cholecystitis. Pharmacy has been consulted for Zosyn dosing.  Plan: Day 3 of antibiotics Continue Zosyn 3.375 g IV Q8H Continue to monitor renal function and follow culture results   Height: '5\' 6"'$  (167.6 cm) Weight: 103.3 kg (227 lb 11.8 oz) IBW/kg (Calculated) : 59.3  Temp (24hrs), Avg:98 F (36.7 C), Min:97.6 F (36.4 C), Max:99 F (37.2 C)  Recent Labs  Lab 01/27/22 0942 01/27/22 1048 01/27/22 1308 01/28/22 0322 01/29/22 0805 01/29/22 0822  WBC 6.4  --   --  16.4*  --  25.5*  CREATININE 1.32*  --   --  1.21* 1.46*  --   LATICACIDVEN  --  1.0 0.9  --   --   --      Estimated Creatinine Clearance: 39.8 mL/min (A) (by C-G formula based on SCr of 1.46 mg/dL (H)).    No Known Allergies  Antimicrobials this admission: 11/29 Cefepime, Metronidazole, Vancomycin x1 11/30 Zosyn >>    Dose adjustments this admission: N/A  Microbiology results: 11/29 BCx: 1 of 4 bottles, Klebsiella pneumoniae (susceptibilities pending) 11/29 UCx: no growth final  11/30 Bile Cx: abundant GNR  Thank you for allowing pharmacy to be a part of this patient's care.  Gretel Acre, PharmD PGY1 Pharmacy Resident 01/29/2022 1:01 PM

## 2022-01-29 NOTE — Progress Notes (Signed)
Rounding Note    Patient Name: [name] Date of Encounter: 01/29/2022  [place] Cardiologist: Ida Rogue, MD   Subjective   Patient still wanting to defer Myoview lexiscan due to abdominal pain, nausea and indigestion. She denies chest pain and shortness of breath.  Inpatient Medications    Scheduled Meds:  aspirin EC  81 mg Oral Daily   ezetimibe  10 mg Oral Daily   FLUoxetine  10 mg Oral Daily   insulin aspart  0-5 Units Subcutaneous QHS   insulin aspart  0-9 Units Subcutaneous TID WC   levothyroxine  125 mcg Oral Q0600   rosuvastatin  20 mg Oral Daily   sodium chloride flush  5 mL Intracatheter Q8H   Continuous Infusions:  sodium chloride 75 mL/hr at 01/28/22 1832   heparin 1,350 Units/hr (01/29/22 0703)   piperacillin-tazobactam (ZOSYN)  IV 3.375 g (01/29/22 0459)   PRN Meds: acetaminophen, hydrALAZINE, ketorolac, morphine injection, nitroGLYCERIN, ondansetron (ZOFRAN) IV, oxyCODONE-acetaminophen   Vital Signs    Vitals:   01/29/22 0150 01/29/22 0422 01/29/22 0442 01/29/22 0750  BP: (!) 133/54  136/64 (!) 128/51  Pulse: 72  80 81  Resp:   20 20  Temp:   97.6 F (36.4 C) 97.6 F (36.4 C)  TempSrc:   Oral   SpO2:   99% 100%  Weight:  103.3 kg    Height:        Intake/Output Summary (Last 24 hours) at 01/29/2022 0919 Last data filed at 01/28/2022 1832 Gross per 24 hour  Intake 1703.77 ml  Output 250 ml  Net 1453.77 ml      01/29/2022    4:22 AM 01/28/2022    9:16 AM 01/28/2022    3:45 AM  Last 3 Weights  Weight (lbs) 227 lb 11.8 oz 217 lb 2.5 oz 217 lb 2.5 oz  Weight (kg) 103.3 kg 98.5 kg 98.5 kg      Telemetry    Nsr HR 90s - Personally Reviewed  ECG    No new - Personally Reviewed  Physical Exam   GEN: No acute distress.   Neck: No JVD Cardiac: RRR, no murmurs, rubs, or gallops.  Respiratory: Clear to auscultation bilaterally. GI: Soft, nontender, non-distended  MS: No edema; No deformity. Neuro:  Nonfocal   Psych: Normal affect   Labs    High Sensitivity Troponin:   Recent Labs  Lab 01/27/22 0942 01/27/22 1450 01/27/22 1909 01/27/22 2242 01/28/22 0322  TROPONINIHS 865* 1,003* 3,514* 5,864* 3,344*     Chemistry Recent Labs  Lab 01/27/22 0942 01/28/22 0322  NA 132* 133*  K 3.6 3.7  CL 102 104  CO2 23 20*  GLUCOSE 133* 113*  BUN 45* 35*  CREATININE 1.32* 1.21*  CALCIUM 7.5* 7.5*  PROT 6.0*  --   ALBUMIN 2.3*  --   AST 35  --   ALT 20  --   ALKPHOS 115  --   BILITOT 0.6  --   GFRNONAA 42* 46*  ANIONGAP 7 9    Lipids  Recent Labs  Lab 01/27/22 1450  CHOL 51  TRIG 177*  HDL <10*  LDLCALC UNABLE TO CALCULATE  CHOLHDL UNABLE TO CALCULATE    Hematology Recent Labs  Lab 01/27/22 0942 01/28/22 0322 01/29/22 0822  WBC 6.4 16.4* 25.5*  RBC 3.22* 3.16* 3.63*  HGB 9.6* 9.3* 10.7*  HCT 30.1* 28.8* 33.7*  MCV 93.5 91.1 92.8  MCH 29.8 29.4 29.5  MCHC 31.9 32.3 31.8  RDW 13.5 13.9 14.5  PLT 118* 112* 141*   Thyroid No results for input(s): "TSH", "FREET4" in the last 168 hours.  BNP Recent Labs  Lab 01/27/22 0952  BNP 38.4    DDimer  Recent Labs  Lab 01/27/22 1048  DDIMER 10.28*     Radiology    ECHOCARDIOGRAM COMPLETE  Result Date: 01/28/2022    ECHOCARDIOGRAM REPORT   Patient Name:   [name] Date of Exam: 01/28/2022 Medical Rec #:  [id number]         Height:       66.0 in Accession #:    [id number]        Weight:       217.2 lb Date of Birth:  Aug 19, 1945          BSA:          2.071 m Patient Age:    76 years          BP:           112/47 mmHg Patient Gender: F                 HR:           76 bpm. Exam Location:  [facility] Procedure: 2D Echo, Color Doppler, Cardiac Doppler and Intracardiac            Opacification Agent Indications:     I21.4 NSTEMI  History:         Patient has no prior history of Echocardiogram examinations and                  Patient has prior history of Echocardiogram examinations, most                  recent 11/26/2020. ICM,  CAD; Risk Factors:Hypertension,                  Dyslipidemia and Sleep Apnea.  Sonographer:     Charmayne Sheer Referring Phys:  [id number] Kate Sable Diagnosing Phys: Ida Rogue MD  Sonographer Comments: Suboptimal apical window and no subcostal window. Image acquisition challenging due to patient body habitus. IMPRESSIONS  1. Left ventricular ejection fraction, by estimation, is 60 to 65%. The left ventricle has normal function. The left ventricle has no regional wall motion abnormalities. Left ventricular diastolic parameters are consistent with Grade I diastolic dysfunction (impaired relaxation).  2. Right ventricular systolic function is normal. The right ventricular size is normal. Tricuspid regurgitation signal is inadequate for assessing PA pressure.  3. The mitral valve is normal in structure. No evidence of mitral valve regurgitation. No evidence of mitral stenosis.  4. The aortic valve is normal in structure. Aortic valve regurgitation is not visualized. No aortic stenosis is present.  5. The inferior vena cava is normal in size with greater than 50% respiratory variability, suggesting right atrial pressure of 3 mmHg. FINDINGS  Left Ventricle: Left ventricular ejection fraction, by estimation, is 60 to 65%. The left ventricle has normal function. The left ventricle has no regional wall motion abnormalities. Definity contrast agent was given IV to delineate the left ventricular  endocardial borders. The left ventricular internal cavity size was normal in size. There is no left ventricular hypertrophy. Left ventricular diastolic parameters are consistent with Grade I diastolic dysfunction (impaired relaxation). Right Ventricle: The right ventricular size is normal. No increase in right ventricular wall thickness. Right ventricular systolic function is normal. Tricuspid regurgitation signal is inadequate for assessing PA pressure. Left  Atrium: Left atrial size was normal in size. Right Atrium: Right  atrial size was normal in size. Pericardium: There is no evidence of pericardial effusion. Mitral Valve: The mitral valve is normal in structure. No evidence of mitral valve regurgitation. No evidence of mitral valve stenosis. Tricuspid Valve: The tricuspid valve is normal in structure. Tricuspid valve regurgitation is not demonstrated. No evidence of tricuspid stenosis. Aortic Valve: The aortic valve is normal in structure. Aortic valve regurgitation is not visualized. No aortic stenosis is present. Aortic valve mean gradient measures 8.0 mmHg. Aortic valve peak gradient measures 12.2 mmHg. Aortic valve area, by VTI measures 2.49 cm. Pulmonic Valve: The pulmonic valve was normal in structure. Pulmonic valve regurgitation is not visualized. No evidence of pulmonic stenosis. Aorta: The aortic root is normal in size and structure. Venous: The inferior vena cava is normal in size with greater than 50% respiratory variability, suggesting right atrial pressure of 3 mmHg. IAS/Shunts: No atrial level shunt detected by color flow Doppler.  LEFT VENTRICLE PLAX 2D LVIDd:         5.10 cm   Diastology LVIDs:         2.80 cm   LV e' medial:    8.27 cm/s LV PW:         1.10 cm   LV E/e' medial:  8.5 LV IVS:        0.90 cm   LV e' lateral:   6.42 cm/s LVOT diam:     2.00 cm   LV E/e' lateral: 10.9 LV SV:         69 LV SV Index:   34 LVOT Area:     3.14 cm  LEFT ATRIUM           Index LA diam:      3.80 cm 1.83 cm/m LA Vol (A4C): 60.1 ml 29.02 ml/m  AORTIC VALVE                     PULMONIC VALVE AV Area (Vmax):    2.59 cm      PV Vmax:       0.86 m/s AV Area (Vmean):   2.28 cm      PV Vmean:      66.200 cm/s AV Area (VTI):     2.49 cm      PV VTI:        0.136 m AV Vmax:           175.00 cm/s   PV Peak grad:  2.9 mmHg AV Vmean:          137.000 cm/s  PV Mean grad:  2.0 mmHg AV VTI:            0.279 m AV Peak Grad:      12.2 mmHg AV Mean Grad:      8.0 mmHg LVOT Vmax:         144.00 cm/s LVOT Vmean:        99.400 cm/s LVOT  VTI:          0.221 m LVOT/AV VTI ratio: 0.79  AORTA Ao Root diam: 2.80 cm MITRAL VALVE MV Area (PHT): 4.77 cm     SHUNTS MV Decel Time: 159 msec     Systemic VTI:  0.22 m MV E velocity: 70.20 cm/s   Systemic Diam: 2.00 cm MV A velocity: 102.00 cm/s MV E/A ratio:  0.69 Ida Rogue MD Electronically signed by Ida Rogue MD Signature Date/Time: 01/28/2022/5:34:28 PM  Final    IR Perc Cholecystostomy  Result Date: 01/28/2022 INDICATION: 76 year old female with a history of EXAM: CHOLECYSTOSTOMY MEDICATIONS: None ANESTHESIA/SEDATION: Moderate (conscious) sedation was employed during this procedure. A total of Versed 0.5 mg and Fentanyl 25 mcg was administered intravenously. Moderate Sedation Time: 12 minutes. The patient's level of consciousness and vital signs were monitored continuously by radiology nursing throughout the procedure under my direct supervision. FLUOROSCOPY TIME:  Fluoroscopy Time: 0 minutes 42 seconds (16 mGy). COMPLICATIONS: None PROCEDURE: Informed written consent was obtained from the patient and the patient's family after a thorough discussion of the procedural risks, benefits and alternatives. All questions were addressed. Maximal Sterile Barrier Technique was utilized including caps, mask, sterile gowns, sterile gloves, sterile drape, hand hygiene and skin antiseptic. A timeout was performed prior to the initiation of the procedure. Ultrasound survey of the right upper quadrant was performed for planning purposes. Once the patient is prepped and draped in the usual sterile fashion, the skin and subcutaneous tissues overlying the gallbladder were generously infiltrated 1% lidocaine for local anesthesia. A coaxial needle was advanced under ultrasound guidance through the skin subcutaneous tissues and a small segment of liver into the gallbladder lumen. With removal of the stylet, spontaneous dark bile drainage occurred. Using modified Seldinger technique, a 10 French drain was placed  into the gallbladder fossa, with aspiration of the sample for the lab. Contrast injection confirmed position of the tube within the gallbladder lumen. Drainage catheter was attached to gravity drain with a suture retention placed. Patient tolerated the procedure well and remained hemodynamically stable throughout. No complications were encountered and no significant blood loss encountered. IMPRESSION: Status post percutaneous cholecystostomy Signed, Dulcy Fanny. Nadene Rubins, RPVI [facility] Radiology Electronically Signed   By: Corrie Mckusick D.O.   On: 01/28/2022 16:35   MR ABDOMEN MRCP W WO CONTAST  Result Date: 01/27/2022 CLINICAL DATA:  Right upper quadrant abdominal pain EXAM: MRI ABDOMEN WITHOUT AND WITH CONTRAST (INCLUDING MRCP) TECHNIQUE: Multiplanar multisequence MR imaging of the abdomen was performed both before and after the administration of intravenous contrast. Heavily T2-weighted images of the biliary and pancreatic ducts were obtained, and three-dimensional MRCP images were rendered by post processing. CONTRAST:  18m GADAVIST GADOBUTROL 1 MMOL/ML IV SOLN COMPARISON:  CT scan 01/27/2022 FINDINGS: Despite efforts by the technologist and patient, motion artifact is present on today's exam and could not be eliminated. This reduces exam sensitivity and specificity. Lower chest: Unremarkable Hepatobiliary: 0.2 cm gallstone in the gallbladder with sludge and additional small gallstones noted. There is gallbladder wall thickening as well as accentuated arterial phase enhancement in the hepatic parenchyma adjacent to the gallbladder which can be associated with cholecystitis. Common bile duct 1.0 cm in diameter. I do not see a definite CBD stone, sensitivity adversely affected by motion. There is some patchy and faint accentuated T2 signal in the left hepatic lobe prior to contrast administration. Post-contrast images confirm left portal vein thrombosis  for example with substantial filling defect in the left portal vein on image 605 of series 26. The right portal vein, main portal vein, superior mesenteric vein, and splenic vein appear patent. I do not see definite tumor in the liver to indicate that the portal vein thrombosis is from a malignant cause, although clearly motion artifact adversely affects assessment. Pancreas:  Atrophic pancreas, otherwise unremarkable. Spleen:  Unremarkable Adrenals/Urinary Tract: Small benign renal cysts were no further workup. Adrenal glands unremarkable. Stomach/Bowel: Unremarkable Vascular/Lymphatic: Atherosclerosis is present, including  aortoiliac atherosclerotic disease. Left portal vein thrombosis as noted above. Other:  No supplemental non-categorized findings. Musculoskeletal: Mild dextroconvex lumbar scoliosis with lumbar spondylosis and degenerative disc disease. IMPRESSION: 1. Acute left portal vein thrombosis. No definite underlying mass is identified, although sensitivity is adversely affected by motion artifact. 2. Cholelithiasis with gallbladder wall thickening and accentuated arterial phase hepatic parenchymal enhancement adjacent to the gallbladder which can be associated with acute cholecystitis. 3. Common bile duct 1.0 cm in diameter, without a definite CBD stone identified. 4. Atrophic pancreas. 5. Lumbar spondylosis and degenerative disc disease. Electronically Signed   By: Van Clines M.D.   On: 01/27/2022 21:13   MR 3D Recon At Scanner  Result Date: 01/27/2022 CLINICAL DATA:  Right upper quadrant abdominal pain EXAM: MRI ABDOMEN WITHOUT AND WITH CONTRAST (INCLUDING MRCP) TECHNIQUE: Multiplanar multisequence MR imaging of the abdomen was performed both before and after the administration of intravenous contrast. Heavily T2-weighted images of the biliary and pancreatic ducts were obtained, and three-dimensional MRCP images were rendered by post processing. CONTRAST:  19m GADAVIST GADOBUTROL 1  MMOL/ML IV SOLN COMPARISON:  CT scan 01/27/2022 FINDINGS: Despite efforts by the technologist and patient, motion artifact is present on today's exam and could not be eliminated. This reduces exam sensitivity and specificity. Lower chest: Unremarkable Hepatobiliary: 0.2 cm gallstone in the gallbladder with sludge and additional small gallstones noted. There is gallbladder wall thickening as well as accentuated arterial phase enhancement in the hepatic parenchyma adjacent to the gallbladder which can be associated with cholecystitis. Common bile duct 1.0 cm in diameter. I do not see a definite CBD stone, sensitivity adversely affected by motion. There is some patchy and faint accentuated T2 signal in the left hepatic lobe prior to contrast administration. Post-contrast images confirm left portal vein thrombosis for example with substantial filling defect in the left portal vein on image 605 of series 26. The right portal vein, main portal vein, superior mesenteric vein, and splenic vein appear patent. I do not see definite tumor in the liver to indicate that the portal vein thrombosis is from a malignant cause, although clearly motion artifact adversely affects assessment. Pancreas:  Atrophic pancreas, otherwise unremarkable. Spleen:  Unremarkable Adrenals/Urinary Tract: Small benign renal cysts were no further workup. Adrenal glands unremarkable. Stomach/Bowel: Unremarkable Vascular/Lymphatic: Atherosclerosis is present, including aortoiliac atherosclerotic disease. Left portal vein thrombosis as noted above. Other:  No supplemental non-categorized findings. Musculoskeletal: Mild dextroconvex lumbar scoliosis with lumbar spondylosis and degenerative disc disease. IMPRESSION: 1. Acute left portal vein thrombosis. No definite underlying mass is identified, although sensitivity is adversely affected by motion artifact. 2. Cholelithiasis with gallbladder wall thickening and accentuated arterial phase hepatic parenchymal  enhancement adjacent to the gallbladder which can be associated with acute cholecystitis. 3. Common bile duct 1.0 cm in diameter, without a definite CBD stone identified. 4. Atrophic pancreas. 5. Lumbar spondylosis and degenerative disc disease. Electronically Signed   By: WVan ClinesM.D.   On: 01/27/2022 21:13   [place]Venous Img Lower Bilateral (DVT)  Result Date: 01/27/2022 CLINICAL DATA:  Knee replacements bilaterally in October with positive D-dimer EXAM: BILATERAL LOWER EXTREMITY VENOUS DOPPLER ULTRASOUND TECHNIQUE: Gray-scale sonography with graded compression, as well as color Doppler and duplex ultrasound were performed to evaluate the lower extremity deep venous systems from the level of the common femoral vein and including the common femoral, femoral, profunda femoral, popliteal and calf veins including the posterior tibial, peroneal and gastrocnemius veins when visible. The superficial great saphenous vein was also interrogated.  Spectral Doppler was utilized to evaluate flow at rest and with distal augmentation maneuvers in the common femoral, femoral and popliteal veins. COMPARISON:  None Available. FINDINGS: RIGHT LOWER EXTREMITY Common Femoral Vein: No evidence of thrombus. Normal compressibility, respiratory phasicity and response to augmentation. Saphenofemoral Junction: No evidence of thrombus. Normal compressibility and flow on color Doppler imaging. Profunda Femoral Vein: No evidence of thrombus. Normal compressibility and flow on color Doppler imaging. Femoral Vein: No evidence of thrombus. Normal compressibility, respiratory phasicity and response to augmentation. Popliteal Vein: No evidence of thrombus. Normal compressibility, respiratory phasicity and response to augmentation. Calf Veins: No evidence of thrombus. Normal compressibility and flow on color Doppler imaging. Superficial Great Saphenous Vein: No evidence of thrombus. Normal compressibility. Venous Reflux:  None. Other  Findings:  None. LEFT LOWER EXTREMITY Common Femoral Vein: No evidence of thrombus. Normal compressibility, respiratory phasicity and response to augmentation. Saphenofemoral Junction: No evidence of thrombus. Normal compressibility and flow on color Doppler imaging. Profunda Femoral Vein: No evidence of thrombus. Normal compressibility and flow on color Doppler imaging. Femoral Vein: No evidence of thrombus. Normal compressibility, respiratory phasicity and response to augmentation. Popliteal Vein: No evidence of thrombus. Normal compressibility, respiratory phasicity and response to augmentation. Calf Veins: The posterior tibial vein is occluded at the mid calf due to acute DVT. Superficial Great Saphenous Vein: No evidence of thrombus. Normal compressibility. Venous Reflux:  None. Other Findings:  None. IMPRESSION: Acute occlusive DVT in the left posterior tibial vein at the mid calf. No DVT on the right. These results were called by telephone at the time of interpretation on 01/27/2022 at 6:35 pm to provider Ivor Costa , who verbally acknowledged these results. Electronically Signed   By: Placido Sou M.D.   On: 01/27/2022 18:36   US ABDOMEN LIMITED RUQ (LIVER/GB)  Result Date: 01/27/2022 CLINICAL DATA:  Right upper quadrant abdominal pain EXAM: ULTRASOUND ABDOMEN LIMITED RIGHT UPPER QUADRANT COMPARISON:  CT same day FINDINGS: Gallbladder: Gallbladder wall thickening up to 9 mm. No Murphy sign. 2.6 cm gallstone at the gallbladder neck. Findings worrisome for acute cholecystitis despite the absence of a Murphy sign. Common bile duct: Diameter: Dilated to 12 mm.  No visible ductal stone. Liver: Normal appearance of the liver parenchyma itself. Portal vein is patent on color Doppler imaging with normal direction of blood flow towards the liver. Other: None. IMPRESSION: 1. 2.6 cm gallstone at the gallbladder neck with gallbladder wall thickening. No Murphy sign. Findings worrisome for acute cholecystitis,  despite the absence of a demonstrable Murphy sign. 2. Dilated common bile duct to 12 mm. No visible ductal stone. Electronically Signed   By: Nelson Chimes M.D.   On: 01/27/2022 14:41   CT Angio Chest PE W and/or Wo Contrast  Result Date: 01/27/2022 CLINICAL DATA:  Pulmonary embolism (PE) suspected, high prob; Abdominal pain, acute, nonlocalized EXAM: CT ANGIOGRAPHY CHEST CT ABDOMEN AND PELVIS WITH CONTRAST TECHNIQUE: Multidetector CT imaging of the chest was performed using the standard protocol during bolus administration of intravenous contrast. Multiplanar CT image reconstructions and MIPs were obtained to evaluate the vascular anatomy. Multidetector CT imaging of the abdomen and pelvis was performed using the standard protocol during bolus administration of intravenous contrast. RADIATION DOSE REDUCTION: This exam was performed according to the departmental dose-optimization program which includes automated exposure control, adjustment of the mA and/or kV according to patient size and/or use of iterative reconstruction technique. CONTRAST:  80m OMNIPAQUE IOHEXOL 350 MG/ML SOLN COMPARISON:  None Available. FINDINGS: CTA CHEST FINDINGS  Cardiovascular: Satisfactory opacification of the pulmonary arteries to the segmental level. No evidence of pulmonary embolism. Pulmonary trunk measures 3.5 cm in diameter. Thoracic aorta is nonaneurysmal. Scattered atherosclerotic vascular calcifications of the aorta and coronary arteries. Heart size is upper limits of normal. No pericardial effusion. Mediastinum/Nodes: No enlarged mediastinal, hilar, or axillary lymph nodes. Thyroid gland, trachea, and esophagus demonstrate no significant findings. Lungs/Pleura: Trace layering bilateral pleural effusions. Mild bibasilar subsegmental atelectasis. No pneumothorax. Musculoskeletal: No chest wall abnormality. No acute or significant osseous findings. Degenerative thoracic spondylosis. Review of the MIP images confirms the above  findings. CT ABDOMEN and PELVIS FINDINGS Hepatobiliary: 4.4 cm stone in the region of the gallbladder neck. Numerous small stones and/or sludge layering within the gallbladder fundus. Gallbladder is moderately distended with diffuse gallbladder wall thickening. No focal liver lesion is identified. There is mild intra and extrahepatic biliary ductal dilatation. Pancreas: Fatty atrophy of the pancreas. No focal pancreatic lesion. No ductal dilatation or inflammatory changes. Spleen: Normal in size without focal abnormality. Adrenals/Urinary Tract: Unremarkable adrenal glands. Kidneys enhance symmetrically without solid lesion, stone, or hydronephrosis. Ureters are nondilated. Urinary bladder appears unremarkable for the degree of distention. Stomach/Bowel: Stomach is within normal limits. Appendix appears normal. Scattered colonic diverticulosis. No evidence of bowel wall thickening, distention, or inflammatory changes. Vascular/Lymphatic: Non-enhancement of the left portal vein suspicious for portal vein thrombosis (series 2, image 26). Main portal vein and right portal veins enhance homogeneously. Aortoiliac atherosclerosis. No abdominopelvic lymphadenopathy. Reproductive: Status post hysterectomy. No adnexal masses. Other: No free fluid. No abdominopelvic fluid collection. No pneumoperitoneum. Musculoskeletal: No acute or significant osseous findings. Degenerative lumbar spondylosis. Review of the MIP images confirms the above findings. IMPRESSION: CTA Chest: 1. No evidence of pulmonary embolism. 2. Trace layering bilateral pleural effusions with mild bibasilar subsegmental atelectasis. 3. Mildly dilated pulmonary trunk suggesting pulmonary arterial hypertension. 4. Aortic and coronary artery atherosclerosis. CT Abdomen/Pelvis: 1. Non-enhancement of the left portal vein suspicious for portal vein thrombosis. Main portal vein and right portal veins enhance homogeneously. 2. Cholelithiasis with a 4.4 cm stone in the  region of the gallbladder neck. Gallbladder is moderately distended with diffuse gallbladder wall thickening and mild intra and extrahepatic biliary ductal dilatation. Findings are concerning for acute cholecystitis. 3. Colonic diverticulosis without evidence of acute diverticulitis. 4. Aortic atherosclerosis (ICD10-I70.0). Electronically Signed   By: Davina Poke D.O.   On: 01/27/2022 13:38   CT ABDOMEN PELVIS W CONTRAST  Result Date: 01/27/2022 CLINICAL DATA:  Pulmonary embolism (PE) suspected, high prob; Abdominal pain, acute, nonlocalized EXAM: CT ANGIOGRAPHY CHEST CT ABDOMEN AND PELVIS WITH CONTRAST TECHNIQUE: Multidetector CT imaging of the chest was performed using the standard protocol during bolus administration of intravenous contrast. Multiplanar CT image reconstructions and MIPs were obtained to evaluate the vascular anatomy. Multidetector CT imaging of the abdomen and pelvis was performed using the standard protocol during bolus administration of intravenous contrast. RADIATION DOSE REDUCTION: This exam was performed according to the departmental dose-optimization program which includes automated exposure control, adjustment of the mA and/or kV according to patient size and/or use of iterative reconstruction technique. CONTRAST:  31m OMNIPAQUE IOHEXOL 350 MG/ML SOLN COMPARISON:  None Available. FINDINGS: CTA CHEST FINDINGS Cardiovascular: Satisfactory opacification of the pulmonary arteries to the segmental level. No evidence of pulmonary embolism. Pulmonary trunk measures 3.5 cm in diameter. Thoracic aorta is nonaneurysmal. Scattered atherosclerotic vascular calcifications of the aorta and coronary arteries. Heart size is upper limits of normal. No pericardial effusion. Mediastinum/Nodes: No enlarged mediastinal, hilar, or axillary  lymph nodes. Thyroid gland, trachea, and esophagus demonstrate no significant findings. Lungs/Pleura: Trace layering bilateral pleural effusions. Mild bibasilar  subsegmental atelectasis. No pneumothorax. Musculoskeletal: No chest wall abnormality. No acute or significant osseous findings. Degenerative thoracic spondylosis. Review of the MIP images confirms the above findings. CT ABDOMEN and PELVIS FINDINGS Hepatobiliary: 4.4 cm stone in the region of the gallbladder neck. Numerous small stones and/or sludge layering within the gallbladder fundus. Gallbladder is moderately distended with diffuse gallbladder wall thickening. No focal liver lesion is identified. There is mild intra and extrahepatic biliary ductal dilatation. Pancreas: Fatty atrophy of the pancreas. No focal pancreatic lesion. No ductal dilatation or inflammatory changes. Spleen: Normal in size without focal abnormality. Adrenals/Urinary Tract: Unremarkable adrenal glands. Kidneys enhance symmetrically without solid lesion, stone, or hydronephrosis. Ureters are nondilated. Urinary bladder appears unremarkable for the degree of distention. Stomach/Bowel: Stomach is within normal limits. Appendix appears normal. Scattered colonic diverticulosis. No evidence of bowel wall thickening, distention, or inflammatory changes. Vascular/Lymphatic: Non-enhancement of the left portal vein suspicious for portal vein thrombosis (series 2, image 26). Main portal vein and right portal veins enhance homogeneously. Aortoiliac atherosclerosis. No abdominopelvic lymphadenopathy. Reproductive: Status post hysterectomy. No adnexal masses. Other: No free fluid. No abdominopelvic fluid collection. No pneumoperitoneum. Musculoskeletal: No acute or significant osseous findings. Degenerative lumbar spondylosis. Review of the MIP images confirms the above findings. IMPRESSION: CTA Chest: 1. No evidence of pulmonary embolism. 2. Trace layering bilateral pleural effusions with mild bibasilar subsegmental atelectasis. 3. Mildly dilated pulmonary trunk suggesting pulmonary arterial hypertension. 4. Aortic and coronary artery atherosclerosis.  CT Abdomen/Pelvis: 1. Non-enhancement of the left portal vein suspicious for portal vein thrombosis. Main portal vein and right portal veins enhance homogeneously. 2. Cholelithiasis with a 4.4 cm stone in the region of the gallbladder neck. Gallbladder is moderately distended with diffuse gallbladder wall thickening and mild intra and extrahepatic biliary ductal dilatation. Findings are concerning for acute cholecystitis. 3. Colonic diverticulosis without evidence of acute diverticulitis. 4. Aortic atherosclerosis (ICD10-I70.0). Electronically Signed   By: Davina Poke D.O.   On: 01/27/2022 13:38   DG Chest 2 View  Result Date: 01/27/2022 CLINICAL DATA:  Fevers EXAM: CHEST - 2 VIEW COMPARISON:  08/03/16 CXR FINDINGS: No pleural effusion. No pneumothorax. Normal cardiac and mediastinal contours. No displaced rib fractures. Degenerative changes of the bilateral glenohumeral joints, progressed on the right compared to 2018. Vertebral body heights are maintained. Rounded densities in the upper abdomen only visualized on the lateral view could represent a combination of renal and gallstones. IMPRESSION: 1.  No active cardiopulmonary disease. 2.  Possible small left sided renal stone. Electronically Signed   By: Marin Roberts M.D.   On: 01/27/2022 10:06    Cardiac Studies   Echo 01/28/22  1. Left ventricular ejection fraction, by estimation, is 60 to 65%. The  left ventricle has normal function. The left ventricle has no regional  wall motion abnormalities. Left ventricular diastolic parameters are  consistent with Grade I diastolic  dysfunction (impaired relaxation).   2. Right ventricular systolic function is normal. The right ventricular  size is normal. Tricuspid regurgitation signal is inadequate for assessing  PA pressure.   3. The mitral valve is normal in structure. No evidence of mitral valve  regurgitation. No evidence of mitral stenosis.   4. The aortic valve is normal in structure. Aortic  valve regurgitation is  not visualized. No aortic stenosis is present.   5. The inferior vena cava is normal in size with greater than  50%  respiratory variability, suggesting right atrial pressure of 3 mmHg.   Patient Profile     76 y.o. female  with a h/o CAD/PCI who is being seen 01/27/2022 for the evaluation of elevated troponins   Assessment & Plan    NSTEMI H/o CAD/PCI to LAD 2015 - HS troponin up to 5864 - no chest pain reported - echo showed LVEF 60-65%, no WMA, G1DD - IV heparin x 48 hours - Patient is in too much discomfort to proceed today, can perform this tomorrow. - LHC not ideal given active infection - continue ASA, Zetia and Crestor  HTN - PTA meds amlodipine, Coreg and Losartan held for low BP>improving, can slowly restart meds  Cholecystitis with dilated common bile duct Sepsis - s/p perc tube placement per IR - IV abx per IM - GI following   For questions or updates, please contact [place] Please consult [email or web address] for contact info under        Signed, Laaibah Wartman Ninfa Meeker, PA-C  01/29/2022, 9:19 AM

## 2022-01-29 NOTE — Progress Notes (Addendum)
ANTICOAGULATION CONSULT NOTE  Pharmacy Consult for Heparin Infusion Indication: ACS/NSTEMI  No Known Allergies  Patient Measurements: Height: '5\' 6"'$  (167.6 cm) Weight: 103.3 kg (227 lb 11.8 oz) IBW/kg (Calculated) : 59.3 Heparin Dosing Weight: 82.9 kg  Vital Signs: Temp: 97.6 F (36.4 C) (12/01 0750) Temp Source: Oral (12/01 0442) BP: 128/51 (12/01 0750) Pulse Rate: 81 (12/01 0750)  Labs: Recent Labs    01/27/22 0942 01/27/22 1446 01/27/22 1450 01/27/22 1909 01/27/22 2242 01/28/22 0322 01/28/22 2030  HGB 9.6*  --   --   --   --  9.3*  --   HCT 30.1*  --   --   --   --  28.8*  --   PLT 118*  --   --   --   --  112*  --   APTT  --  42*  --   --   --   --   --   LABPROT  --  14.2  --   --   --   --   --   INR  --  1.1  --   --   --   --   --   HEPARINUNFRC  --   --   --   --  0.25*  --  0.28*  CREATININE 1.32*  --   --   --   --  1.21*  --   TROPONINIHS 865*  --    < > 3,514* 5,864* 3,344*  --    < > = values in this interval not displayed.     Estimated Creatinine Clearance: 48 mL/min (A) (by C-G formula based on SCr of 1.21 mg/dL (H)).   Medical History: Past Medical History:  Diagnosis Date   Coronary artery disease    a. 07/2013 STEMI/PCI ([place]): LM nl, LAD 100p (thrombectomy & 3.5x23 Alpine DES), LCX nl, RCA nl, EF 45%.   Depression    Diastolic dysfunction    a.) TTE 11/26/2020 - LVEF 50-55%; G2DD.   Endometrial cancer ([place])    Gravida 3 para 3    Hyperlipidemia    Hypertension    Hypertensive heart disease    Hypothyroid    Ischemic cardiomyopathy    a.) 07/2013 EF 45% @ time of STEMI; b.) 11/2013 Echo: EF 55-60%, mildly dil LA, nl RV fxn. c.) TTE 07/29/2015 - LVEF 55-60%, mild LA and RV dilation. d.) TTE 11/26/2020 - LVEF 50-55%; G2DD.   Menopause    Obstructive sleep apnea on CPAP    Osteoarthritis    PONV (postoperative nausea and vomiting)    ST elevation myocardial infarction (STEMI) of anterolateral wall ([place]) 08/14/2013   a.) LVEF reduced at  45%; Tx'd with trombectomy and PCI to 100% LAD; 3.5 x 23 mm Xience Alpine DES placed.   T2DM (type 2 diabetes mellitus) ([place])    Thyroid cancer ([place])    a.) s/p total thyroidectomy. b.) on daily levothyroxine.    Medications:  Scheduled:   aspirin EC  81 mg Oral Daily   ezetimibe  10 mg Oral Daily   FLUoxetine  10 mg Oral Daily   insulin aspart  0-5 Units Subcutaneous QHS   insulin aspart  0-9 Units Subcutaneous TID WC   levothyroxine  125 mcg Oral Q0600   rosuvastatin  20 mg Oral Daily   sodium chloride flush  5 mL Intracatheter Q8H   Infusions:   sodium chloride 75 mL/hr at 01/28/22 1832   heparin 1,350 Units/hr (01/29/22 0703)  piperacillin-tazobactam (ZOSYN)  IV 3.375 g (01/29/22 0459)   PRN: acetaminophen, hydrALAZINE, ketorolac, morphine injection, nitroGLYCERIN, ondansetron (ZOFRAN) IV, oxyCODONE-acetaminophen  Assessment: [name] is a 76 y.o. female presenting with elevated troponins and D-Dimer. PMH significant for CAD (STEMI s/p stent 2015), HLD, HTN, DM, hypothyroidism (s/p thyroidectomy), depression, OSA on CPAP, endometrial cancer, dCHF, knee replacements (R-11/2020, L-11/2021), CKD-3 . Patient was not on [facility] PTA per chart review. [place] found acute occlusive DVT in the left posterior tibial vein at the mid calf. Pharmacy has been consulted to initiate and manage heparin infusion.   Labs for monitoring heparin infusion have been delayed on multiple occasions due to inability to draw sample.   Baseline Labs: aPTT 42, PT 14.2, INR 1.1, Hgb 9.6, Hct 30.1, Plt 118   Goal of Therapy:  Heparin level 0.3-0.7 units/ml Monitor platelets by anticoagulation protocol: Yes    Date Time HL Rate/Comment  11/29 2242 0.25 1050/SUBtherapeutic 11/30 0830 --- 0/paused for procedure 11/30 1141 --- 1200/resumed post procedure 11/30   2030    0.28     1200/SUBtherapeutic  12/1 1102 0.28 1350/SUBtherapeutic  Plan:  Give heparin bolus of 1250 units x1 Increase heparin infusion  to 1550 units/hr Check HL in 8 hours  Continue to monitor H&H and platelets daily while on heparin infusion   [name], PharmD PGY1 Pharmacy Resident 01/29/2022 8:38 AM

## 2022-01-29 NOTE — Progress Notes (Addendum)
[facility] NAME: [name]    MR#:  [id number]  DATE OF BIRTH:  09/03/1945  SUBJECTIVE:  No family during my evaluation. Patient continued to have pain at the gallbladder drain site. IV Toradol received. Pain a bit better. Patient feels if he can get my return tomorrow that will be great she will get some rest. No fever. No vomiting. Denies any chest pain.   VITALS:  Blood pressure (!) 107/43, pulse 77, temperature 97.7 F (36.5 C), temperature source Oral, resp. rate 18, height '5\' 6"'$  (1.676 m), weight 103.3 kg, SpO2 99 %.  PHYSICAL EXAMINATION:   GENERAL:  76 y.o.-year-old patient lying in the bed with no acute distress. Obese LUNGS: Normal breath sounds bilaterally, no wheezing CARDIOVASCULAR: S1, S2 normal. No murmurs,   ABDOMEN: Soft, tender, nondistended.right sided GB drain+ EXTREMITIES: No  edema b/l.    NEUROLOGIC: nonfocal  patient is alert and awake SKIN: No obvious rash, lesion, or ulcer.   LABORATORY PANEL:  CBC Recent Labs  Lab 01/29/22 0822  WBC 25.5*  HGB 10.7*  HCT 33.7*  PLT 141*     Chemistries  Recent Labs  Lab 01/27/22 0942 01/28/22 0322 01/29/22 0805  NA 132*   < > 129*  K 3.6   < > 4.3  CL 102   < > 101  CO2 23   < > 15*  GLUCOSE 133*   < > 213*  BUN 45*   < > 36*  CREATININE 1.32*   < > 1.46*  CALCIUM 7.5*   < > 7.7*  AST 35  --   --   ALT 20  --   --   ALKPHOS 115  --   --   BILITOT 0.6  --   --    < > = values in this interval not displayed.    Cardiac Enzymes No results for input(s): "TROPONINI" in the last 168 hours. RADIOLOGY:  ECHOCARDIOGRAM COMPLETE  Result Date: 01/28/2022    ECHOCARDIOGRAM REPORT   Patient Name:   [name] Date of Exam: 01/28/2022 Medical Rec #:  [id number]         Height:       66.0 in Accession #:    [id number]        Weight:       217.2 lb Date of Birth:  01-Apr-1945          BSA:          2.071 m Patient Age:    85 years          BP:            112/47 mmHg Patient Gender: F                 HR:           76 bpm. Exam Location:  [facility] Procedure: 2D Echo, Color Doppler, Cardiac Doppler and Intracardiac            Opacification Agent Indications:     I21.4 NSTEMI  History:         Patient has no prior history of Echocardiogram examinations and                  Patient has prior history of Echocardiogram examinations, most                  recent 11/26/2020. ICM, CAD; Risk Factors:Hypertension,  Dyslipidemia and Sleep Apnea.  Sonographer:     Charmayne Sheer Referring Phys:  [id number] Kate Sable Diagnosing Phys: Ida Rogue MD  Sonographer Comments: Suboptimal apical window and no subcostal window. Image acquisition challenging due to patient body habitus. IMPRESSIONS  1. Left ventricular ejection fraction, by estimation, is 60 to 65%. The left ventricle has normal function. The left ventricle has no regional wall motion abnormalities. Left ventricular diastolic parameters are consistent with Grade I diastolic dysfunction (impaired relaxation).  2. Right ventricular systolic function is normal. The right ventricular size is normal. Tricuspid regurgitation signal is inadequate for assessing PA pressure.  3. The mitral valve is normal in structure. No evidence of mitral valve regurgitation. No evidence of mitral stenosis.  4. The aortic valve is normal in structure. Aortic valve regurgitation is not visualized. No aortic stenosis is present.  5. The inferior vena cava is normal in size with greater than 50% respiratory variability, suggesting right atrial pressure of 3 mmHg. FINDINGS  Left Ventricle: Left ventricular ejection fraction, by estimation, is 60 to 65%. The left ventricle has normal function. The left ventricle has no regional wall motion abnormalities. Definity contrast agent was given IV to delineate the left ventricular  endocardial borders. The left ventricular internal cavity size was normal in size. There is no left  ventricular hypertrophy. Left ventricular diastolic parameters are consistent with Grade I diastolic dysfunction (impaired relaxation). Right Ventricle: The right ventricular size is normal. No increase in right ventricular wall thickness. Right ventricular systolic function is normal. Tricuspid regurgitation signal is inadequate for assessing PA pressure. Left Atrium: Left atrial size was normal in size. Right Atrium: Right atrial size was normal in size. Pericardium: There is no evidence of pericardial effusion. Mitral Valve: The mitral valve is normal in structure. No evidence of mitral valve regurgitation. No evidence of mitral valve stenosis. Tricuspid Valve: The tricuspid valve is normal in structure. Tricuspid valve regurgitation is not demonstrated. No evidence of tricuspid stenosis. Aortic Valve: The aortic valve is normal in structure. Aortic valve regurgitation is not visualized. No aortic stenosis is present. Aortic valve mean gradient measures 8.0 mmHg. Aortic valve peak gradient measures 12.2 mmHg. Aortic valve area, by VTI measures 2.49 cm. Pulmonic Valve: The pulmonic valve was normal in structure. Pulmonic valve regurgitation is not visualized. No evidence of pulmonic stenosis. Aorta: The aortic root is normal in size and structure. Venous: The inferior vena cava is normal in size with greater than 50% respiratory variability, suggesting right atrial pressure of 3 mmHg. IAS/Shunts: No atrial level shunt detected by color flow Doppler.  LEFT VENTRICLE PLAX 2D LVIDd:         5.10 cm   Diastology LVIDs:         2.80 cm   LV e' medial:    8.27 cm/s LV PW:         1.10 cm   LV E/e' medial:  8.5 LV IVS:        0.90 cm   LV e' lateral:   6.42 cm/s LVOT diam:     2.00 cm   LV E/e' lateral: 10.9 LV SV:         69 LV SV Index:   34 LVOT Area:     3.14 cm  LEFT ATRIUM           Index LA diam:      3.80 cm 1.83 cm/m LA Vol (A4C): 60.1 ml 29.02 ml/m  AORTIC VALVE  PULMONIC VALVE AV Area  (Vmax):    2.59 cm      PV Vmax:       0.86 m/s AV Area (Vmean):   2.28 cm      PV Vmean:      66.200 cm/s AV Area (VTI):     2.49 cm      PV VTI:        0.136 m AV Vmax:           175.00 cm/s   PV Peak grad:  2.9 mmHg AV Vmean:          137.000 cm/s  PV Mean grad:  2.0 mmHg AV VTI:            0.279 m AV Peak Grad:      12.2 mmHg AV Mean Grad:      8.0 mmHg LVOT Vmax:         144.00 cm/s LVOT Vmean:        99.400 cm/s LVOT VTI:          0.221 m LVOT/AV VTI ratio: 0.79  AORTA Ao Root diam: 2.80 cm MITRAL VALVE MV Area (PHT): 4.77 cm     SHUNTS MV Decel Time: 159 msec     Systemic VTI:  0.22 m MV E velocity: 70.20 cm/s   Systemic Diam: 2.00 cm MV A velocity: 102.00 cm/s MV E/A ratio:  0.69 Ida Rogue MD Electronically signed by Ida Rogue MD Signature Date/Time: 01/28/2022/5:34:28 PM    Final    IR Perc Cholecystostomy  Result Date: 01/28/2022 INDICATION: 76 year old female with a history of EXAM: CHOLECYSTOSTOMY MEDICATIONS: None ANESTHESIA/SEDATION: Moderate (conscious) sedation was employed during this procedure. A total of Versed 0.5 mg and Fentanyl 25 mcg was administered intravenously. Moderate Sedation Time: 12 minutes. The patient's level of consciousness and vital signs were monitored continuously by radiology nursing throughout the procedure under my direct supervision. FLUOROSCOPY TIME:  Fluoroscopy Time: 0 minutes 42 seconds (16 mGy). COMPLICATIONS: None PROCEDURE: Informed written consent was obtained from the patient and the patient's family after a thorough discussion of the procedural risks, benefits and alternatives. All questions were addressed. Maximal Sterile Barrier Technique was utilized including caps, mask, sterile gowns, sterile gloves, sterile drape, hand hygiene and skin antiseptic. A timeout was performed prior to the initiation of the procedure. Ultrasound survey of the right upper quadrant was performed for planning purposes. Once the patient is prepped and draped in the  usual sterile fashion, the skin and subcutaneous tissues overlying the gallbladder were generously infiltrated 1% lidocaine for local anesthesia. A coaxial needle was advanced under ultrasound guidance through the skin subcutaneous tissues and a small segment of liver into the gallbladder lumen. With removal of the stylet, spontaneous dark bile drainage occurred. Using modified Seldinger technique, a 10 French drain was placed into the gallbladder fossa, with aspiration of the sample for the lab. Contrast injection confirmed position of the tube within the gallbladder lumen. Drainage catheter was attached to gravity drain with a suture retention placed. Patient tolerated the procedure well and remained hemodynamically stable throughout. No complications were encountered and no significant blood loss encountered. IMPRESSION: Status post percutaneous cholecystostomy Signed, Dulcy Fanny. Nadene Rubins, RPVI [facility] Electronically Signed   By: Corrie Mckusick D.O.   On: 01/28/2022 16:35   MR ABDOMEN MRCP W WO CONTAST  Result Date: 01/27/2022 CLINICAL DATA:  Right upper quadrant abdominal pain EXAM: MRI ABDOMEN WITHOUT AND WITH CONTRAST (  INCLUDING MRCP) TECHNIQUE: Multiplanar multisequence MR imaging of the abdomen was performed both before and after the administration of intravenous contrast. Heavily T2-weighted images of the biliary and pancreatic ducts were obtained, and three-dimensional MRCP images were rendered by post processing. CONTRAST:  34m GADAVIST GADOBUTROL 1 MMOL/ML IV SOLN COMPARISON:  CT scan 01/27/2022 FINDINGS: Despite efforts by the technologist and patient, motion artifact is present on today's exam and could not be eliminated. This reduces exam sensitivity and specificity. Lower chest: Unremarkable Hepatobiliary: 0.2 cm gallstone in the gallbladder with sludge and additional small gallstones noted. There is gallbladder wall thickening  as well as accentuated arterial phase enhancement in the hepatic parenchyma adjacent to the gallbladder which can be associated with cholecystitis. Common bile duct 1.0 cm in diameter. I do not see a definite CBD stone, sensitivity adversely affected by motion. There is some patchy and faint accentuated T2 signal in the left hepatic lobe prior to contrast administration. Post-contrast images confirm left portal vein thrombosis for example with substantial filling defect in the left portal vein on image 605 of series 26. The right portal vein, main portal vein, superior mesenteric vein, and splenic vein appear patent. I do not see definite tumor in the liver to indicate that the portal vein thrombosis is from a malignant cause, although clearly motion artifact adversely affects assessment. Pancreas:  Atrophic pancreas, otherwise unremarkable. Spleen:  Unremarkable Adrenals/Urinary Tract: Small benign renal cysts were no further workup. Adrenal glands unremarkable. Stomach/Bowel: Unremarkable Vascular/Lymphatic: Atherosclerosis is present, including aortoiliac atherosclerotic disease. Left portal vein thrombosis as noted above. Other:  No supplemental non-categorized findings. Musculoskeletal: Mild dextroconvex lumbar scoliosis with lumbar spondylosis and degenerative disc disease. IMPRESSION: 1. Acute left portal vein thrombosis. No definite underlying mass is identified, although sensitivity is adversely affected by motion artifact. 2. Cholelithiasis with gallbladder wall thickening and accentuated arterial phase hepatic parenchymal enhancement adjacent to the gallbladder which can be associated with acute cholecystitis. 3. Common bile duct 1.0 cm in diameter, without a definite CBD stone identified. 4. Atrophic pancreas. 5. Lumbar spondylosis and degenerative disc disease. Electronically Signed   By: WVan ClinesM.D.   On: 01/27/2022 21:13   MR 3D Recon At Scanner  Result Date: 01/27/2022 CLINICAL DATA:   Right upper quadrant abdominal pain EXAM: MRI ABDOMEN WITHOUT AND WITH CONTRAST (INCLUDING MRCP) TECHNIQUE: Multiplanar multisequence MR imaging of the abdomen was performed both before and after the administration of intravenous contrast. Heavily T2-weighted images of the biliary and pancreatic ducts were obtained, and three-dimensional MRCP images were rendered by post processing. CONTRAST:  13mGADAVIST GADOBUTROL 1 MMOL/ML IV SOLN COMPARISON:  CT scan 01/27/2022 FINDINGS: Despite efforts by the technologist and patient, motion artifact is present on today's exam and could not be eliminated. This reduces exam sensitivity and specificity. Lower chest: Unremarkable Hepatobiliary: 0.2 cm gallstone in the gallbladder with sludge and additional small gallstones noted. There is gallbladder wall thickening as well as accentuated arterial phase enhancement in the hepatic parenchyma adjacent to the gallbladder which can be associated with cholecystitis. Common bile duct 1.0 cm in diameter. I do not see a definite CBD stone, sensitivity adversely affected by motion. There is some patchy and faint accentuated T2 signal in the left hepatic lobe prior to contrast administration. Post-contrast images confirm left portal vein thrombosis for example with substantial filling defect in the left portal vein on image 605 of series 26. The right portal vein, main portal vein, superior mesenteric vein, and splenic vein appear patent. I do  not see definite tumor in the liver to indicate that the portal vein thrombosis is from a malignant cause, although clearly motion artifact adversely affects assessment. Pancreas:  Atrophic pancreas, otherwise unremarkable. Spleen:  Unremarkable Adrenals/Urinary Tract: Small benign renal cysts were no further workup. Adrenal glands unremarkable. Stomach/Bowel: Unremarkable Vascular/Lymphatic: Atherosclerosis is present, including aortoiliac atherosclerotic disease. Left portal vein thrombosis as  noted above. Other:  No supplemental non-categorized findings. Musculoskeletal: Mild dextroconvex lumbar scoliosis with lumbar spondylosis and degenerative disc disease. IMPRESSION: 1. Acute left portal vein thrombosis. No definite underlying mass is identified, although sensitivity is adversely affected by motion artifact. 2. Cholelithiasis with gallbladder wall thickening and accentuated arterial phase hepatic parenchymal enhancement adjacent to the gallbladder which can be associated with acute cholecystitis. 3. Common bile duct 1.0 cm in diameter, without a definite CBD stone identified. 4. Atrophic pancreas. 5. Lumbar spondylosis and degenerative disc disease. Electronically Signed   By: Van Clines M.D.   On: 01/27/2022 21:13   US Venous Img Lower Bilateral (DVT)  Result Date: 01/27/2022 CLINICAL DATA:  Knee replacements bilaterally in October with positive D-dimer EXAM: BILATERAL LOWER EXTREMITY VENOUS DOPPLER ULTRASOUND TECHNIQUE: Gray-scale sonography with graded compression, as well as color Doppler and duplex ultrasound were performed to evaluate the lower extremity deep venous systems from the level of the common femoral vein and including the common femoral, femoral, profunda femoral, popliteal and calf veins including the posterior tibial, peroneal and gastrocnemius veins when visible. The superficial great saphenous vein was also interrogated. Spectral Doppler was utilized to evaluate flow at rest and with distal augmentation maneuvers in the common femoral, femoral and popliteal veins. COMPARISON:  None Available. FINDINGS: RIGHT LOWER EXTREMITY Common Femoral Vein: No evidence of thrombus. Normal compressibility, respiratory phasicity and response to augmentation. Saphenofemoral Junction: No evidence of thrombus. Normal compressibility and flow on color Doppler imaging. Profunda Femoral Vein: No evidence of thrombus. Normal compressibility and flow on color Doppler imaging. Femoral Vein:  No evidence of thrombus. Normal compressibility, respiratory phasicity and response to augmentation. Popliteal Vein: No evidence of thrombus. Normal compressibility, respiratory phasicity and response to augmentation. Calf Veins: No evidence of thrombus. Normal compressibility and flow on color Doppler imaging. Superficial Great Saphenous Vein: No evidence of thrombus. Normal compressibility. Venous Reflux:  None. Other Findings:  None. LEFT LOWER EXTREMITY Common Femoral Vein: No evidence of thrombus. Normal compressibility, respiratory phasicity and response to augmentation. Saphenofemoral Junction: No evidence of thrombus. Normal compressibility and flow on color Doppler imaging. Profunda Femoral Vein: No evidence of thrombus. Normal compressibility and flow on color Doppler imaging. Femoral Vein: No evidence of thrombus. Normal compressibility, respiratory phasicity and response to augmentation. Popliteal Vein: No evidence of thrombus. Normal compressibility, respiratory phasicity and response to augmentation. Calf Veins: The posterior tibial vein is occluded at the mid calf due to acute DVT. Superficial Great Saphenous Vein: No evidence of thrombus. Normal compressibility. Venous Reflux:  None. Other Findings:  None. IMPRESSION: Acute occlusive DVT in the left posterior tibial vein at the mid calf. No DVT on the right. These results were called by telephone at the time of interpretation on 01/27/2022 at 6:35 pm to provider Ivor Costa , who verbally acknowledged these results. Electronically Signed   By: Placido Sou M.D.   On: 01/27/2022 18:36   US ABDOMEN LIMITED RUQ (LIVER/GB)  Result Date: 01/27/2022 CLINICAL DATA:  Right upper quadrant abdominal pain EXAM: ULTRASOUND ABDOMEN LIMITED RIGHT UPPER QUADRANT COMPARISON:  CT same day FINDINGS: Gallbladder: Gallbladder wall thickening up to  9 mm. No Murphy sign. 2.6 cm gallstone at the gallbladder neck. Findings worrisome for acute cholecystitis despite the  absence of a Murphy sign. Common bile duct: Diameter: Dilated to 12 mm.  No visible ductal stone. Liver: Normal appearance of the liver parenchyma itself. Portal vein is patent on color Doppler imaging with normal direction of blood flow towards the liver. Other: None. IMPRESSION: 1. 2.6 cm gallstone at the gallbladder neck with gallbladder wall thickening. No Murphy sign. Findings worrisome for acute cholecystitis, despite the absence of a demonstrable Murphy sign. 2. Dilated common bile duct to 12 mm. No visible ductal stone. Electronically Signed   By: Nelson Chimes M.D.   On: 01/27/2022 14:41    Assessment and Plan   AVYONNA WAGONER is a 76 y.o. female with medical history significant of CAD with stent placement, HLD, DM, hypothyroidism, depression, OSA, endometrial cancer, diastolic CHF, recent knee replacement, CKD-3, who presents with fever, nausea, vomiting.  Patient states that she has fever and chills for 4.5 days.  She had temperature 100.8 at home today.    CTA Chest: 1. No evidence of pulmonary embolism. 2. Trace layering bilateral pleural effusions with mild bibasilar subsegmental atelectasis. 3. Mildly dilated pulmonary trunk suggesting pulmonary arterial hypertension. 4. Aortic and coronary artery atherosclerosis.   CT Abdomen/Pelvis:  1. Non-enhancement of the left portal vein suspicious for portal vein thrombosis. Main portal vein and right portal veins enhance homogeneously. 2. Cholelithiasis with a 4.4 cm stone in the region of the gallbladder neck. Gallbladder is moderately distended with diffuse gallbladder wall thickening and mild intra and extrahepatic biliary ductal dilatation. Findings are concerning for acute cholecystitis. 3. Colonic diverticulosis without evidence of acute diverticulitis. 4. Aortic atherosclerosis (ICD10-I70.0).   US-RUQ 1. 2.6 cm gallstone at the gallbladder neck with gallbladder wall thickening. No Murphy sign. Findings worrisome for  acute cholecystitis, despite the absence of a demonstrable Murphy sign. 2. Dilated common bile duct to 12 mm. No visible ductal stone.   LE venous doppler: Acute occlusive DVT in the left posterior tibial vein at the mid calf.  No DVT on the right.       Radiological Exams on Admission:  Imaging Results (Last 48 hours)  US Venous Img Lower Bilateral (DVT)   Result Date: 01/27/2022 CLINICAL DATA:  Knee replacements bilaterally in October with positive D-dimer EXAM: BILATERAL LOWER EXTREMITY VENOUS DOPPLER ULTRASOUND TECHNIQUE: Gray-scale sonography with graded compression, as well as color Doppler and duplex ultrasound were performed to evaluate the lower extremity deep venous systems from the level of the common femoral vein and including the common femoral, femoral, profunda femoral, popliteal and calf veins including the posterior tibial, peroneal and gastrocnemius veins when visible. The superficial great saphenous vein was also interrogated. Spectral Doppler was utilized to evaluate flow at rest and with distal augmentation maneuvers in the common femoral, femoral and popliteal veins. COMPARISON:  None Available. FINDINGS: RIGHT LOWER EXTREMITY Common Femoral Vein: No evidence of thrombus. Normal compressibility, respiratory phasicity and response to augmentation. Saphenofemoral Junction: No evidence of thrombus. Normal compressibility and flow on color Doppler imaging. Profunda Femoral Vein: No evidence of thrombus. Normal compressibility and flow on color Doppler imaging. Femoral Vein: No evidence of thrombus. Normal compressibility, respiratory phasicity and response to augmentation. Popliteal Vein: No evidence of thrombus. Normal compressibility, respiratory phasicity and response to augmentation. Calf Veins: No evidence of thrombus. Normal compressibility and flow on color Doppler imaging. Superficial Great Saphenous Vein: No evidence of thrombus. Normal compressibility. Venous Reflux:  None.  Other Findings:  None. LEFT LOWER EXTREMITY Common Femoral Vein: No evidence of thrombus. Normal compressibility, respiratory phasicity and response to augmentation. Saphenofemoral Junction: No evidence of thrombus. Normal compressibility and flow on color Doppler imaging. Profunda Femoral Vein: No evidence of thrombus. Normal compressibility and flow on color Doppler imaging. Femoral Vein: No evidence of thrombus. Normal compressibility, respiratory phasicity and response to augmentation. Popliteal Vein: No evidence of thrombus. Normal compressibility, respiratory phasicity and response to augmentation. Calf Veins: The posterior tibial vein is occluded at the mid calf due to acute DVT. Superficial Great Saphenous Vein: No evidence of thrombus. Normal compressibility. Venous Reflux:  None. Other Findings:  None. IMPRESSION: Acute occlusive DVT in the left posterior tibial vein at the mid calf. No DVT on the right. These results were called by telephone at the time of interpretation on 01/27/2022 at 6:35 pm to provider Ivor Costa , who verbally acknowledged these results. Electronically Signed   By: Placido Sou M.D.   On: 01/27/2022 18:36    US ABDOMEN LIMITED RUQ (LIVER/GB)   Result Date: 01/27/2022 CLINICAL DATA:  Right upper quadrant abdominal pain EXAM: ULTRASOUND ABDOMEN LIMITED RIGHT UPPER QUADRANT COMPARISON:  CT same day FINDINGS: Gallbladder: Gallbladder wall thickening up to 9 mm. No Murphy sign. 2.6 cm gallstone at the gallbladder neck. Findings worrisome for acute cholecystitis despite the absence of a Murphy sign. Common bile duct: Diameter: Dilated to 12 mm.  No visible ductal stone. Liver: Normal appearance of the liver parenchyma itself. Portal vein is patent on color Doppler imaging with normal direction of blood flow towards the liver. Other: None. IMPRESSION: 1. 2.6 cm gallstone at the gallbladder neck with gallbladder wall thickening. No Murphy sign. Findings worrisome for acute  cholecystitis, despite the absence of a demonstrable Murphy sign. 2. Dilated common bile duct to 12 mm. No visible ductal stone. Electronically Signed   By: Nelson Chimes M.D.   On: 01/27/2022 14:41    CT Angio Chest PE W and/or Wo Contrast   Result Date: 01/27/2022 CLINICAL DATA:  Pulmonary embolism (PE) suspected, high prob; Abdominal pain, acute, nonlocalized EXAM: CT ANGIOGRAPHY CHEST CT ABDOMEN AND PELVIS WITH CONTRAST TECHNIQUE: Multidetector CT imaging of the chest was performed using the standard protocol during bolus administration of intravenous contrast. Multiplanar CT image reconstructions and MIPs were obtained to evaluate the vascular anatomy. Multidetector CT imaging of the abdomen and pelvis was performed using the standard protocol during bolus administration of intravenous contrast. RADIATION DOSE REDUCTION: This exam was performed according to the departmental dose-optimization program which includes automated exposure control, adjustment of the mA and/or kV according to patient size and/or use of iterative reconstruction technique. CONTRAST:  74m OMNIPAQUE IOHEXOL 350 MG/ML SOLN COMPARISON:  None Available. FINDINGS: CTA CHEST FINDINGS Cardiovascular: Satisfactory opacification of the pulmonary arteries to the segmental level. No evidence of pulmonary embolism. Pulmonary trunk measures 3.5 cm in diameter. Thoracic aorta is nonaneurysmal. Scattered atherosclerotic vascular calcifications of the aorta and coronary arteries. Heart size is upper limits of normal. No pericardial effusion. Mediastinum/Nodes: No enlarged mediastinal, hilar, or axillary lymph nodes. Thyroid gland, trachea, and esophagus demonstrate no significant findings. Lungs/Pleura: Trace layering bilateral pleural effusions. Mild bibasilar subsegmental atelectasis. No pneumothorax. Musculoskeletal: No chest wall abnormality. No acute or significant osseous findings. Degenerative thoracic spondylosis. Review of the MIP images  confirms the above findings. CT ABDOMEN and PELVIS FINDINGS Hepatobiliary: 4.4 cm stone in the region of the gallbladder neck. Numerous small stones and/or sludge layering within the  gallbladder fundus. Gallbladder is moderately distended with diffuse gallbladder wall thickening. No focal liver lesion is identified. There is mild intra and extrahepatic biliary ductal dilatation. Pancreas: Fatty atrophy of the pancreas. No focal pancreatic lesion. No ductal dilatation or inflammatory changes. Spleen: Normal in size without focal abnormality. Adrenals/Urinary Tract: Unremarkable adrenal glands. Kidneys enhance symmetrically without solid lesion, stone, or hydronephrosis. Ureters are nondilated. Urinary bladder appears unremarkable for the degree of distention. Stomach/Bowel: Stomach is within normal limits. Appendix appears normal. Scattered colonic diverticulosis. No evidence of bowel wall thickening, distention, or inflammatory changes. Vascular/Lymphatic: Non-enhancement of the left portal vein suspicious for portal vein thrombosis (series 2, image 26). Main portal vein and right portal veins enhance homogeneously. Aortoiliac atherosclerosis. No abdominopelvic lymphadenopathy. Reproductive: Status post hysterectomy. No adnexal masses. Other: No free fluid. No abdominopelvic fluid collection. No pneumoperitoneum. Musculoskeletal: No acute or significant osseous findings. Degenerative lumbar spondylosis. Review of the MIP images confirms the above findings. IMPRESSION: CTA Chest: 1. No evidence of pulmonary embolism. 2. Trace layering bilateral pleural effusions with mild bibasilar subsegmental atelectasis. 3. Mildly dilated pulmonary trunk suggesting pulmonary arterial hypertension. 4. Aortic and coronary artery atherosclerosis. CT Abdomen/Pelvis: 1. Non-enhancement of the left portal vein suspicious for portal vein thrombosis. Main portal vein and right portal veins enhance homogeneously. 2. Cholelithiasis with a  4.4 cm stone in the region of the gallbladder neck. Gallbladder is moderately distended with diffuse gallbladder wall thickening and mild intra and extrahepatic biliary ductal dilatation. Findings are concerning for acute cholecystitis. 3. Colonic diverticulosis without evidence of acute diverticulitis. 4. Aortic atherosclerosis (ICD10-I70.0). Electronically Signed   By: Davina Poke D.O.   On: 01/27/2022 13:38    CT ABDOMEN PELVIS W CONTRAST   Result Date: 01/27/2022 CLINICAL DATA:  Pulmonary embolism (PE) suspected, high prob; Abdominal pain, acute, nonlocalized EXAM: CT ANGIOGRAPHY CHEST CT ABDOMEN AND PELVIS WITH CONTRAST TECHNIQUE: Multidetector CT imaging of the chest was performed using the standard protocol during bolus administration of intravenous contrast. Multiplanar CT image reconstructions and MIPs were obtained to evaluate the vascular anatomy. Multidetector CT imaging of the abdomen and pelvis was performed using the standard protocol during bolus administration of intravenous contrast. RADIATION DOSE REDUCTION: This exam was performed according to the departmental dose-optimization program which includes automated exposure control, adjustment of the mA and/or kV according to patient size and/or use of iterative reconstruction technique. CONTRAST:  21m OMNIPAQUE IOHEXOL 350 MG/ML SOLN COMPARISON:  None Available. FINDINGS: CTA CHEST FINDINGS Cardiovascular: Satisfactory opacification of the pulmonary arteries to the segmental level. No evidence of pulmonary embolism. Pulmonary trunk measures 3.5 cm in diameter. Thoracic aorta is nonaneurysmal. Scattered atherosclerotic vascular calcifications of the aorta and coronary arteries. Heart size is upper limits of normal. No pericardial effusion. Mediastinum/Nodes: No enlarged mediastinal, hilar, or axillary lymph nodes. Thyroid gland, trachea, and esophagus demonstrate no significant findings. Lungs/Pleura: Trace layering bilateral pleural  effusions. Mild bibasilar subsegmental atelectasis. No pneumothorax. Musculoskeletal: No chest wall abnormality. No acute or significant osseous findings. Degenerative thoracic spondylosis. Review of the MIP images confirms the above findings. CT ABDOMEN and PELVIS FINDINGS Hepatobiliary: 4.4 cm stone in the region of the gallbladder neck. Numerous small stones and/or sludge layering within the gallbladder fundus. Gallbladder is moderately distended with diffuse gallbladder wall thickening. No focal liver lesion is identified. There is mild intra and extrahepatic biliary ductal dilatation. Pancreas: Fatty atrophy of the pancreas. No focal pancreatic lesion. No ductal dilatation or inflammatory changes. Spleen: Normal in size without focal abnormality. Adrenals/Urinary Tract: Unremarkable  adrenal glands. Kidneys enhance symmetrically without solid lesion, stone, or hydronephrosis. Ureters are nondilated. Urinary bladder appears unremarkable for the degree of distention. Stomach/Bowel: Stomach is within normal limits. Appendix appears normal. Scattered colonic diverticulosis. No evidence of bowel wall thickening, distention, or inflammatory changes. Vascular/Lymphatic: Non-enhancement of the left portal vein suspicious for portal vein thrombosis (series 2, image 26). Main portal vein and right portal veins enhance homogeneously. Aortoiliac atherosclerosis. No abdominopelvic lymphadenopathy. Reproductive: Status post hysterectomy. No adnexal masses. Other: No free fluid. No abdominopelvic fluid collection. No pneumoperitoneum. Musculoskeletal: No acute or significant osseous findings. Degenerative lumbar spondylosis. Review of the MIP images confirms the above findings. IMPRESSION: CTA Chest: 1. No evidence of pulmonary embolism. 2. Trace layering bilateral pleural effusions with mild bibasilar subsegmental atelectasis. 3. Mildly dilated pulmonary trunk suggesting pulmonary arterial hypertension. 4. Aortic and coronary  artery atherosclerosis. CT Abdomen/Pelvis: 1. Non-enhancement of the left portal vein suspicious for portal vein thrombosis. Main portal vein and right portal veins enhance homogeneously. 2. Cholelithiasis with a 4.4 cm stone in the region of the gallbladder neck. Gallbladder is moderately distended with diffuse gallbladder wall thickening and mild intra and extrahepatic biliary ductal dilatation. Findings are concerning for acute cholecystitis. 3. Colonic diverticulosis without evidence of acute diverticulitis. 4. Aortic atherosclerosis (ICD10-I70.0). Electronically Signed   By: Davina Poke D.O.   On: 01/27/2022 13:38    DG Chest 2 View   Result Date: 01/27/2022 CLINICAL DATA:  Fevers EXAM: CHEST - 2 VIEW COMPARISON:  08/03/16 CXR FINDINGS: No pleural effusion. No pneumothorax. Normal cardiac and mediastinal contours. No displaced rib fractures. Degenerative changes of the bilateral glenohumeral joints, progressed on the right compared to 2018. Vertebral body heights are maintained. Rounded densities in the upper abdomen only visualized on the lateral view could represent a combination of renal and gallstones. IMPRESSION: 1.  No active cardiopulmonary disease. 2.  Possible small left sided renal stone. Electronically Signed   By: Marin Roberts M.D.   On: 01/27/2022 10:06           Assessment/Plan Principal Problem:   NSTEMI (non-ST elevated myocardial infarction) ([place]) Active Problems:   CAD (coronary artery disease)   Cholecystitis   Controlled type 2 diabetes mellitus with diabetic nephropathy (HCC)   HTN (hypertension)   Hypothyroidism   Hyperlipidemia associated with type 2 diabetes mellitus (HCC)   Chronic diastolic CHF (congestive heart failure) (HCC)   Thrombocytopenia (HCC)   Major depressive disorder, recurrent, in remission ([place])   Obstructive sleep apnea on CPAP   Chronic kidney disease, stage 3a (HCC)   Portal vein thrombosis   Obesity (BMI 30.0-34.9)   DVT (deep venous  thrombosis) (HCC)     Assessment and Plan:   NSTEMI (non-ST elevated myocardial infarction) and hx of CAD: s/p of stent Consulted Dr. Annette Stable -IV heparin -ASA, Zetia and Crestor -trend trop peaked 5000 -echo done --Last stent placed in 2014 --Myoview stress test in am (Saturday)   Klebsiella Sepsis Acute Cholecystitis/Gall stones: WBC 16.4, temperature 98.4, heart rate 72, RR 24, does not meet criteria for sepsis.  -- Consulted Dr. Lysle Pearl of surgery--recommends GB drain placement per IR, surgery signed off. F/u outpt  -IV Zosyn in ED).  Patient has dilated CBD.   --Consulted Dr. Haig Prophet of GI. -Blood culture 1/2 Colette Ribas -As needed morphine, Zofran, oxycodone -MRCP noted --trial of Toradol   Controlled type 2 diabetes mellitus with diabetic nephropathy [employer]): Recent A1c 6.6, well-controlled.  Patient taking metformin and Ozempic at home. -SSI   HTN (  hypertension) -Hold home blood pressure medications including amlodipine, Coreg, Cozaar due to soft blood pressure -IV hydralazine as needed  Acute Left post tibial vein DVT (deep venous thrombosis) ([place]): CTA negative for PE -IV heparin --will change to po DOAC after cardiac w/u is completed   Hypothyroidism -Synthroid   Hyperlipidemia associated with type 2 diabetes mellitus (HCC) -Cresto and Zetia   Chronic diastolic CHF (congestive heart failure) ([place]): Recent 2D echo 11/26/2020 showed EF of 50-55% with grade 2 diastolic dysfunction.  Patient has trace leg edema, does not seem to have CHF exacerbation. -stable   Thrombocytopenia ([place]): Platelet 118 -LDH  120 and peripheral smear plt normal morphology   Major depressive disorder, recurrent, in remission ([place]) -Continue home medications   Obstructive sleep apnea - on CPAP   Chronic kidney disease, stage 3a ([place]): Renal function is worsening than baseline.  Recent baseline creatinine 0.9 01/11/2022.  Her creatinine is 1.32, BUN 45. -IV fluid: 500 cc normal  saline, then 75 cc/h -Hold Cozaar   Portal vein thrombosis -On IV heparin   Obesity (BMI 30.0-34.9): BMI= 35.99  and BW=101.2 -Diet and exercise.   -Encourage to lose weight.       Procedures s/p GB drain placement by IR on 01/28/2022 Family communication :dter Aimee Consults : general surgery, cardiology, G.I. CODE STATUS: full code DVT Prophylaxis : heparin drip Level of care: Progressive Status is: Inpatient Remains inpatient appropriate because: sepsis due to acute cholecystitis, non-STEMI, Acute DVT    TOTAL TIME TAKING CARE OF THIS PATIENT: 35 minutes.  >50% time spent on counselling and coordination of care  Note: This dictation was prepared with Dragon dictation along with smaller phrase technology. Any transcriptional errors that result from this process are unintentional.  Fritzi Mandes M.D    [facility]   CC: Primary care physician; Olin Hauser, DO

## 2022-01-29 NOTE — TOC Benefit Eligibility Note (Signed)
Patient Teacher, English as a foreign language completed.    The patient is currently admitted and upon discharge could be taking Eliquis 5 mg.  The current 30 day co-pay is $149.97 due to being in Coverage Gap (donut hole).   The patient is currently admitted and upon discharge could be taking Xarelto 20 mg.  The current 30 day co-pay is $145.08 due to being in Coverage Gap (donut hole).   The patient is insured through [place], [place] Patient Advocate Specialist [place] Patient Advocate Team Direct Number: [phone]  Fax: [phone]

## 2022-01-30 ENCOUNTER — Inpatient Hospital Stay (HOSPITAL_COMMUNITY): Payer: Medicare Other

## 2022-01-30 ENCOUNTER — Inpatient Hospital Stay: Payer: Medicare Other

## 2022-01-30 DIAGNOSIS — I214 Non-ST elevation (NSTEMI) myocardial infarction: Secondary | ICD-10-CM | POA: Diagnosis not present

## 2022-01-30 LAB — CBC
HCT: 30.8 % — ABNORMAL LOW (ref 36.0–46.0)
Hemoglobin: 9.7 g/dL — ABNORMAL LOW (ref 12.0–15.0)
MCH: 29.4 pg (ref 26.0–34.0)
MCHC: 31.5 g/dL (ref 30.0–36.0)
MCV: 93.3 fL (ref 80.0–100.0)
Platelets: 186 10*3/uL (ref 150–400)
RBC: 3.3 MIL/uL — ABNORMAL LOW (ref 3.87–5.11)
RDW: 14.7 % (ref 11.5–15.5)
WBC: 44.6 10*3/uL — ABNORMAL HIGH (ref 4.0–10.5)
nRBC: 0 % (ref 0.0–0.2)

## 2022-01-30 LAB — BASIC METABOLIC PANEL
Anion gap: 12 (ref 5–15)
BUN: 42 mg/dL — ABNORMAL HIGH (ref 8–23)
CO2: 15 mmol/L — ABNORMAL LOW (ref 22–32)
Calcium: 7.3 mg/dL — ABNORMAL LOW (ref 8.9–10.3)
Chloride: 101 mmol/L (ref 98–111)
Creatinine, Ser: 2.29 mg/dL — ABNORMAL HIGH (ref 0.44–1.00)
GFR, Estimated: 22 mL/min — ABNORMAL LOW (ref >60)
Glucose, Bld: 153 mg/dL — ABNORMAL HIGH (ref 70–99)
Potassium: 3.9 mmol/L (ref 3.5–5.1)
Sodium: 128 mmol/L — ABNORMAL LOW (ref 135–145)

## 2022-01-30 LAB — NM MYOCAR MULTI W/SPECT W/WALL MOTION / EF
Base ST Depression (mm): 0 mm
LV dias vol: 79 mL (ref 46–106)
LV sys vol: 38 mL
Nuc Stress EF: 60 %
Peak HR: 88 {beats}/min
Rest HR: 78 {beats}/min
Rest Nuclear Isotope Dose: 10.2 mCi
SDS: 6
SRS: 7
SSS: 6
ST Depression (mm): 0 mm
Stress Nuclear Isotope Dose: 29.1 mCi
TID: 1.6

## 2022-01-30 LAB — HEPARIN LEVEL (UNFRACTIONATED): Heparin Unfractionated: 0.39 IU/mL (ref 0.30–0.70)

## 2022-01-30 LAB — GLUCOSE, CAPILLARY
Glucose-Capillary: 152 mg/dL — ABNORMAL HIGH (ref 70–99)
Glucose-Capillary: 147 mg/dL — ABNORMAL HIGH (ref 70–99)
Glucose-Capillary: 157 mg/dL — ABNORMAL HIGH (ref 70–99)
Glucose-Capillary: 149 mg/dL — ABNORMAL HIGH (ref 70–99)

## 2022-01-30 LAB — CULTURE, BLOOD (ROUTINE X 2)

## 2022-01-30 MED ORDER — BISACODYL 5 MG PO TBEC
10.0000 mg | DELAYED_RELEASE_TABLET | Freq: Every day | ORAL | Status: DC | PRN
  Administered 2022-01-30: 10 mg via ORAL
  Filled 2022-01-30: qty 2

## 2022-01-30 MED ORDER — SODIUM CHLORIDE 0.9 % IV BOLUS
500.0000 mL | Freq: Once | INTRAVENOUS | Status: AC
  Administered 2022-01-30: 500 mL via INTRAVENOUS

## 2022-01-30 MED ORDER — TECHNETIUM TC 99M TETROFOSMIN IV KIT
10.2000 | PACK | Freq: Once | INTRAVENOUS | Status: AC | PRN
  Administered 2022-01-30: 10.2 via INTRAVENOUS

## 2022-01-30 MED ORDER — LEVALBUTEROL HCL 1.25 MG/0.5ML IN NEBU: 1.2500 mg | INHALATION_SOLUTION | Freq: Four times a day (QID) | RESPIRATORY_TRACT | Status: DC | PRN

## 2022-01-30 MED ORDER — TECHNETIUM TC 99M TETROFOSMIN IV KIT
29.1400 | PACK | Freq: Once | INTRAVENOUS | Status: AC | PRN
  Administered 2022-01-30: 29.14 via INTRAVENOUS

## 2022-01-30 MED ORDER — IOHEXOL 300 MG/ML  SOLN
100.0000 mL | Freq: Once | INTRAMUSCULAR | Status: AC | PRN
  Administered 2022-01-30: 100 mL via INTRAVENOUS

## 2022-01-30 MED ORDER — REGADENOSON 0.4 MG/5ML IV SOLN
0.4000 mg | Freq: Once | INTRAVENOUS | Status: AC
  Administered 2022-01-30: 0.4 mg via INTRAVENOUS

## 2022-01-30 NOTE — Progress Notes (Addendum)
[place] for Heparin Infusion Indication:NSTEMI and DVT  No Known Allergies  Patient Measurements: Height: '5\' 6"'$  (167.6 cm) Weight:  (pt sleep in chair) IBW/kg (Calculated) : 59.3 Heparin Dosing Weight: 82.9 kg  Vital Signs: Temp: 97.8 F (36.6 C) (12/02 0810) Temp Source: Oral (12/02 0451) BP: 114/52 (12/02 0810) Pulse Rate: 81 (12/02 0810)  Labs: Recent Labs    01/27/22 0942 01/27/22 1446 01/27/22 1450 01/27/22 1909 01/27/22 2242 01/28/22 0322 01/28/22 2030 01/29/22 0805 01/29/22 0822 01/29/22 1102 01/29/22 2111 01/30/22 0716  HGB 9.6*  --   --   --   --  9.3*  --   --  10.7*  --   --  9.7*  HCT 30.1*  --   --   --   --  28.8*  --   --  33.7*  --   --  30.8*  PLT 118*  --   --   --   --  112*  --   --  141*  --   --  186  APTT  --  42*  --   --   --   --   --   --   --   --   --   --   LABPROT  --  14.2  --   --   --   --   --   --   --   --   --   --   INR  --  1.1  --   --   --   --   --   --   --   --   --   --   HEPARINUNFRC  --   --   --   --  0.25*  --    < >  --   --  0.28* 0.45 0.39  CREATININE 1.32*  --   --   --   --  1.21*  --  1.46*  --   --   --   --   TROPONINIHS 865*  --    < > 3,514* 5,864* 3,344*  --   --   --   --   --   --    < > = values in this interval not displayed.     Estimated Creatinine Clearance: 39.8 mL/min (A) (by C-G formula based on SCr of 1.46 mg/dL (H)).   Medical History: Past Medical History:  Diagnosis Date   Coronary artery disease    a. 07/2013 STEMI/PCI ([place]): LM nl, LAD 100p (thrombectomy & 3.5x23 Alpine DES), LCX nl, RCA nl, EF 45%.   Depression    Diastolic dysfunction    a.) TTE 11/26/2020 - LVEF 50-55%; G2DD.   Endometrial cancer ([place])    Gravida 3 para 3    Hyperlipidemia    Hypertension    Hypertensive heart disease    Hypothyroid    Ischemic cardiomyopathy    a.) 07/2013 EF 45% @ time of STEMI; b.) 11/2013 Echo: EF 55-60%, mildly dil LA, nl RV fxn. c.) TTE 07/29/2015  - LVEF 55-60%, mild LA and RV dilation. d.) TTE 11/26/2020 - LVEF 50-55%; G2DD.   Menopause    Obstructive sleep apnea on CPAP    Osteoarthritis    PONV (postoperative nausea and vomiting)    ST elevation myocardial infarction (STEMI) of anterolateral wall ([place]) 08/14/2013   a.) LVEF reduced at 45%; Tx'd with trombectomy and PCI to 100% LAD; 3.5  x 23 mm Xience Alpine DES placed.   T2DM (type 2 diabetes mellitus) ([place])    Thyroid cancer ([place])    a.) s/p total thyroidectomy. b.) on daily levothyroxine.    Medications:  Scheduled:   aspirin EC  81 mg Oral Daily   ezetimibe  10 mg Oral Daily   FLUoxetine  10 mg Oral Daily   insulin aspart  0-5 Units Subcutaneous QHS   insulin aspart  0-9 Units Subcutaneous TID WC   levothyroxine  125 mcg Oral Q0600   polyethylene glycol  17 g Oral Daily   rosuvastatin  20 mg Oral Daily   sodium chloride flush  5 mL Intracatheter Q8H   Infusions:   sodium chloride 75 mL/hr at 01/29/22 1930   heparin 1,550 Units/hr (01/29/22 2143)   piperacillin-tazobactam (ZOSYN)  IV 3.375 g (01/30/22 0520)   PRN: acetaminophen, hydrALAZINE, ketorolac, morphine injection, nitroGLYCERIN, ondansetron (ZOFRAN) IV, ondansetron, [COMPLETED] senna-docusate **FOLLOWED BY** senna-docusate, technetium tetrofosmin  Assessment: [name] is a 76 y.o. female presenting with elevated troponins and D-Dimer. PMH significant for CAD (STEMI s/p stent 2015), HLD, HTN, DM, hypothyroidism (s/p thyroidectomy), depression, OSA on CPAP, endometrial cancer, dCHF, knee replacements (R-11/2020, L-11/2021), CKD-3 . Patient was not on [facility] per chart review. [place] found acute occlusive DVT in the left posterior tibial vein at the mid calf. Pharmacy has been consulted to initiate and manage heparin infusion.   Labs for monitoring heparin infusion have been delayed on multiple occasions due to inability to draw sample.   Baseline Labs: aPTT 42, PT 14.2, INR 1.1, Hgb 9.6, Hct 30.1, Plt 118    Goal of Therapy:  Heparin level 0.3-0.7 units/ml Monitor platelets by anticoagulation protocol: Yes    Date Time HL Rate/Comment  11/29 2242 0.25 1050/SUBtherapeutic 11/30 0830 --- 0/paused for procedure 11/30 1141 --- 1200/resumed post procedure 11/30   2030    0.28     1200/SUBtherapeutic  12/1 1102 0.28 1350/SUBtherapeutic 12/01 2111 0.45 Therapeutic x 1 12/2 0716 0.39 therapeuticx2  Plan:  12/2 0716 HL=0.39  therapeuticx2 Continue heparin infusion at 1550 units/hr Recheck HL w/ AM labs to confirm Continue to monitor H&H and platelets daily while on heparin infusion   Chinita Green[place] PharmD Clinical Pharmacist 01/30/2022

## 2022-01-30 NOTE — Progress Notes (Signed)
Rounding Note    Patient Name: [name] Date of Encounter: 01/30/2022  [place] Cardiologist: Ida Rogue, MD   Subjective   Seen during stress test. BP was low prior to [place], IVF given with improvement. Patient denies chest pain or SOB. LLE on exam.   Inpatient Medications    Scheduled Meds:  aspirin EC  81 mg Oral Daily   ezetimibe  10 mg Oral Daily   FLUoxetine  10 mg Oral Daily   insulin aspart  0-5 Units Subcutaneous QHS   insulin aspart  0-9 Units Subcutaneous TID WC   levothyroxine  125 mcg Oral Q0600   polyethylene glycol  17 g Oral Daily   rosuvastatin  20 mg Oral Daily   sodium chloride flush  5 mL Intracatheter Q8H   Continuous Infusions:  sodium chloride 75 mL/hr at 01/29/22 1930   heparin 1,550 Units/hr (01/29/22 2143)   piperacillin-tazobactam (ZOSYN)  IV 3.375 g (01/30/22 0520)   sodium chloride     PRN Meds: acetaminophen, hydrALAZINE, ketorolac, morphine injection, nitroGLYCERIN, ondansetron (ZOFRAN) IV, ondansetron, [COMPLETED] senna-docusate **FOLLOWED BY** senna-docusate   Vital Signs    Vitals:   01/30/22 0033 01/30/22 0451 01/30/22 0513 01/30/22 0810  BP: (!) 109/48 (!) 108/51  (!) 114/52  Pulse: 84 84  81  Resp: '20 18 20 18  '$ Temp: 97.8 F (36.6 C) 97.6 F (36.4 C)  97.8 F (36.6 C)  TempSrc: Oral Oral    SpO2: 95% 100%  92%  Weight:      Height:        Intake/Output Summary (Last 24 hours) at 01/30/2022 1140 Last data filed at 01/30/2022 0656 Gross per 24 hour  Intake 490 ml  Output 190 ml  Net 300 ml      01/29/2022    4:22 AM 01/28/2022    9:16 AM 01/28/2022    3:45 AM  Last 3 Weights  Weight (lbs) 227 lb 11.8 oz 217 lb 2.5 oz 217 lb 2.5 oz  Weight (kg) 103.3 kg 98.5 kg 98.5 kg      Telemetry    NSR HR 80s- Personally Reviewed  ECG    NO new - Personally Reviewed  Physical Exam   GEN: No acute distress.   Neck: No JVD Cardiac: RRR, no murmurs, rubs, or gallops.  Respiratory: Clear  to auscultation bilaterally. GI: Soft, nontender, non-distended  MS: lower leg edema; No deformity. Neuro:  Nonfocal  Psych: Normal affect   Labs    High Sensitivity Troponin:   Recent Labs  Lab 01/27/22 0942 01/27/22 1450 01/27/22 1909 01/27/22 2242 01/28/22 0322  TROPONINIHS 865* 1,003* 3,514* 5,864* 3,344*     Chemistry Recent Labs  Lab 01/27/22 0942 01/28/22 0322 01/29/22 0805  NA 132* 133* 129*  K 3.6 3.7 4.3  CL 102 104 101  CO2 23 20* 15*  GLUCOSE 133* 113* 213*  BUN 45* 35* 36*  CREATININE 1.32* 1.21* 1.46*  CALCIUM 7.5* 7.5* 7.7*  PROT 6.0*  --   --   ALBUMIN 2.3*  --   --   AST 35  --   --   ALT 20  --   --   ALKPHOS 115  --   --   BILITOT 0.6  --   --   GFRNONAA 42* 46* 37*  ANIONGAP '7 9 13    '$ Lipids  Recent Labs  Lab 01/27/22 1450  CHOL 51  TRIG 177*  HDL <10*  LDLCALC UNABLE TO CALCULATE  Dewy Rose    Hematology Recent Labs  Lab 01/28/22 0322 01/29/22 0822 01/30/22 0716  WBC 16.4* 25.5* 44.6*  RBC 3.16* 3.63* 3.30*  HGB 9.3* 10.7* 9.7*  HCT 28.8* 33.7* 30.8*  MCV 91.1 92.8 93.3  MCH 29.4 29.5 29.4  MCHC 32.3 31.8 31.5  RDW 13.9 14.5 14.7  PLT 112* 141* 186   Thyroid No results for input(s): "TSH", "FREET4" in the last 168 hours.  BNP Recent Labs  Lab 01/27/22 0952  BNP 38.4    DDimer  Recent Labs  Lab 01/27/22 1048  DDIMER 10.28*     Radiology    ECHOCARDIOGRAM COMPLETE  Result Date: 01/28/2022    ECHOCARDIOGRAM REPORT   Patient Name:   [name] Date of Exam: 01/28/2022 Medical Rec #:  [id number]         Height:       66.0 in Accession #:    [id number]        Weight:       217.2 lb Date of Birth:  Jan 07, 1946          BSA:          2.071 m Patient Age:    38 years          BP:           112/47 mmHg Patient Gender: F                 HR:           76 bpm. Exam Location:  [facility] Procedure: 2D Echo, Color Doppler, Cardiac Doppler and Intracardiac            Opacification Agent Indications:     I21.4  NSTEMI  History:         Patient has no prior history of Echocardiogram examinations and                  Patient has prior history of Echocardiogram examinations, most                  recent 11/26/2020. ICM, CAD; Risk Factors:Hypertension,                  Dyslipidemia and Sleep Apnea.  Sonographer:     Charmayne Sheer Referring Phys:  [id number] Kate Sable Diagnosing Phys: Ida Rogue MD  Sonographer Comments: Suboptimal apical window and no subcostal window. Image acquisition challenging due to patient body habitus. IMPRESSIONS  1. Left ventricular ejection fraction, by estimation, is 60 to 65%. The left ventricle has normal function. The left ventricle has no regional wall motion abnormalities. Left ventricular diastolic parameters are consistent with Grade I diastolic dysfunction (impaired relaxation).  2. Right ventricular systolic function is normal. The right ventricular size is normal. Tricuspid regurgitation signal is inadequate for assessing PA pressure.  3. The mitral valve is normal in structure. No evidence of mitral valve regurgitation. No evidence of mitral stenosis.  4. The aortic valve is normal in structure. Aortic valve regurgitation is not visualized. No aortic stenosis is present.  5. The inferior vena cava is normal in size with greater than 50% respiratory variability, suggesting right atrial pressure of 3 mmHg. FINDINGS  Left Ventricle: Left ventricular ejection fraction, by estimation, is 60 to 65%. The left ventricle has normal function. The left ventricle has no regional wall motion abnormalities. Definity contrast agent was given IV to delineate the left ventricular  endocardial borders. The left ventricular internal cavity size  was normal in size. There is no left ventricular hypertrophy. Left ventricular diastolic parameters are consistent with Grade I diastolic dysfunction (impaired relaxation). Right Ventricle: The right ventricular size is normal. No increase in right ventricular  wall thickness. Right ventricular systolic function is normal. Tricuspid regurgitation signal is inadequate for assessing PA pressure. Left Atrium: Left atrial size was normal in size. Right Atrium: Right atrial size was normal in size. Pericardium: There is no evidence of pericardial effusion. Mitral Valve: The mitral valve is normal in structure. No evidence of mitral valve regurgitation. No evidence of mitral valve stenosis. Tricuspid Valve: The tricuspid valve is normal in structure. Tricuspid valve regurgitation is not demonstrated. No evidence of tricuspid stenosis. Aortic Valve: The aortic valve is normal in structure. Aortic valve regurgitation is not visualized. No aortic stenosis is present. Aortic valve mean gradient measures 8.0 mmHg. Aortic valve peak gradient measures 12.2 mmHg. Aortic valve area, by VTI measures 2.49 cm. Pulmonic Valve: The pulmonic valve was normal in structure. Pulmonic valve regurgitation is not visualized. No evidence of pulmonic stenosis. Aorta: The aortic root is normal in size and structure. Venous: The inferior vena cava is normal in size with greater than 50% respiratory variability, suggesting right atrial pressure of 3 mmHg. IAS/Shunts: No atrial level shunt detected by color flow Doppler.  LEFT VENTRICLE PLAX 2D LVIDd:         5.10 cm   Diastology LVIDs:         2.80 cm   LV e' medial:    8.27 cm/s LV PW:         1.10 cm   LV E/e' medial:  8.5 LV IVS:        0.90 cm   LV e' lateral:   6.42 cm/s LVOT diam:     2.00 cm   LV E/e' lateral: 10.9 LV SV:         69 LV SV Index:   34 LVOT Area:     3.14 cm  LEFT ATRIUM           Index LA diam:      3.80 cm 1.83 cm/m LA Vol (A4C): 60.1 ml 29.02 ml/m  AORTIC VALVE                     PULMONIC VALVE AV Area (Vmax):    2.59 cm      PV Vmax:       0.86 m/s AV Area (Vmean):   2.28 cm      PV Vmean:      66.200 cm/s AV Area (VTI):     2.49 cm      PV VTI:        0.136 m AV Vmax:           175.00 cm/s   PV Peak grad:  2.9 mmHg AV  Vmean:          137.000 cm/s  PV Mean grad:  2.0 mmHg AV VTI:            0.279 m AV Peak Grad:      12.2 mmHg AV Mean Grad:      8.0 mmHg LVOT Vmax:         144.00 cm/s LVOT Vmean:        99.400 cm/s LVOT VTI:          0.221 m LVOT/AV VTI ratio: 0.79  AORTA Ao Root diam: 2.80 cm MITRAL VALVE MV Area (PHT): 4.77  cm     SHUNTS MV Decel Time: 159 msec     Systemic VTI:  0.22 m MV E velocity: 70.20 cm/s   Systemic Diam: 2.00 cm MV A velocity: 102.00 cm/s MV E/A ratio:  0.69 Ida Rogue MD Electronically signed by Ida Rogue MD Signature Date/Time: 01/28/2022/5:34:28 PM    Final     Cardiac Studies   Echo 01/28/22  1. Left ventricular ejection fraction, by estimation, is 60 to 65%. The  left ventricle has normal function. The left ventricle has no regional  wall motion abnormalities. Left ventricular diastolic parameters are  consistent with Grade I diastolic  dysfunction (impaired relaxation).   2. Right ventricular systolic function is normal. The right ventricular  size is normal. Tricuspid regurgitation signal is inadequate for assessing  PA pressure.   3. The mitral valve is normal in structure. No evidence of mitral valve  regurgitation. No evidence of mitral stenosis.   4. The aortic valve is normal in structure. Aortic valve regurgitation is  not visualized. No aortic stenosis is present.   5. The inferior vena cava is normal in size with greater than 50%  respiratory variability, suggesting right atrial pressure of 3 mmHg.   Patient Profile     76 y.o. female with a h/o CAD/PCI who is being seen 01/27/2022 for the evaluation of elevated troponins .  Assessment & Plan    NSTEMI H/o CAD/PCI to LAD 2015 - HS troponin up to 5864 - no chest pain reported - echo showed LVEF 60-65%, no WMA, G1DD - IV heparin x 48 hours - LHC not ideal given active infection - continue ASA, Zetia and Crestor - Myoview Lexiscan performed today   HTN - PTA meds amlodipine, Coreg and Losartan  held for low BP   Cholecystitis with dilated common bile duct Sepsis - s/p perc tube placement per IR - IV abx per IM - GI following  Acute DVT - CTA negative for PE - IV heparin - change to DOAC after cardiac work-up is complete  Chronic diastolic CHF - recent echo 10/2020 showed LVEF 50-55%, G2DD. - patient has lower leg edema on exam - may need lasix at some point  CKD stage 3 - Baseline Scr 0.9 - BMET today  For questions or updates, please contact [place] Please consult [email or web address] for contact info under        Signed, Sila Sarsfield Ninfa Meeker, PA-C  01/30/2022, 11:40 AM

## 2022-01-30 NOTE — Progress Notes (Signed)
Spoke with IR on call Dr Denna Haggard patient CT scan abdomen was reviewed. Radiology will redo the procedure tomorrow unless patient becomes unstable it will be done sooner. This message was relayed to patient's daughter, patient's RN and explained. Will continue IV heparin drip for now. NPO after midnight increase IV fluids 100 mL per hour PRN breathing treatment monitor input output and creatinine. Consider nephrology consultation if needed.

## 2022-01-30 NOTE — Plan of Care (Signed)

## 2022-01-30 NOTE — Progress Notes (Signed)
[facility] [place] NAME: [name]    MR#:  [id number]  DATE OF BIRTH:  [birth year]  SUBJECTIVE:  Pt's friend at bedside during my evaluation. Patient continues to have pain at the in the upper abdomen  Went for myoview stress test today IV heparin   VITALS:  Blood pressure (!) 111/41, pulse 81, temperature (!) 97.5 F (36.4 C), temperature source Oral, resp. rate 18, height '5\' 6"'$  (1.676 m), weight 103.3 kg, SpO2 100 %.  PHYSICAL EXAMINATION:   GENERAL:  76 y.o.-year-old patient lying in the bed with no acute distress. Obese LUNGS: Normal breath sounds bilaterally, no wheezing CARDIOVASCULAR: S1, S2 normal. No murmur   ABDOMEN: Soft, tender, nondistended.right sided GB drain+ EXTREMITIES: +  edema b/l.    NEUROLOGIC: nonfocal  patient is alert and awake SKIN: No obvious rash, lesion, or ulcer.   LABORATORY PANEL:  CBC Recent Labs  Lab 01/30/22 0716  WBC 44.6*  HGB 9.7*  HCT 30.8*  PLT 186     Chemistries  Recent Labs  Lab 01/27/22 0942 01/28/22 0322 01/30/22 1330  NA 132*   < > 128*  K 3.6   < > 3.9  CL 102   < > 101  CO2 23   < > 15*  GLUCOSE 133*   < > 153*  BUN 45*   < > 42*  CREATININE 1.32*   < > 2.29*  CALCIUM 7.5*   < > 7.3*  AST 35  --   --   ALT 20  --   --   ALKPHOS 115  --   --   BILITOT 0.6  --   --    < > = values in this interval not displayed.    Cardiac Enzymes No results for input(s): "TROPONINI" in the last 168 hours. RADIOLOGY:  CT ABDOMEN W CONTRAST  Result Date: 01/30/2022 CLINICAL DATA:  Acute abdominal pain. Cholecystitis. Status post percutaneous cholecystostomy. EXAM: CT ABDOMEN WITH CONTRAST TECHNIQUE: Multidetector CT imaging of the abdomen was performed using the standard protocol following bolus administration of intravenous contrast. RADIATION DOSE REDUCTION: This exam was performed according to the departmental dose-optimization program which includes automated exposure  control, adjustment of the mA and/or kV according to patient size and/or use of iterative reconstruction technique. CONTRAST:  134m OMNIPAQUE IOHEXOL 300 MG/ML  SOLN COMPARISON:  01/27/2022 FINDINGS: Lower chest: Mild dependent atelectasis in both lung bases. Hepatobiliary: No hepatic masses or abscess identified. Thrombosis of the left portal vein is again seen. Large gallstone and diffuse gallbladder wall thickening unchanged in appearance, consistent with acute cholecystitis. Mild biliary ductal dilatation remains stable. A cholecystostomy tube is seen in abnormal position, with the pigtail loop located in the left perihepatic space. Mild perihepatic ascites is noted. Pancreas:  No mass or inflammatory changes. Spleen:  Within normal limits in size and appearance. Adrenals/Urinary Tract: No suspicious masses identified. No evidence of hydronephrosis. Stomach/Bowel: Increased gaseous distention of nondependent transverse colon, consistent with colonic ileus. Vascular/Lymphatic: No pathologically enlarged lymph nodes identified. No acute vascular findings. Aortic atherosclerotic calcification incidentally noted. Other: Mild bilateral upper quadrant ascites. Mild diffuse body wall edema. Musculoskeletal:  No suspicious bone lesions identified. IMPRESSION: Cholecystostomy tube in abnormal position, with the pigtail loop located in the left perihepatic space. Stable findings consistent with acute cholecystitis. Stable mild biliary ductal dilatation. Stable left portal vein thrombosis. New mild ascites and diffuse body wall edema. Mild colonic ileus. Aortic Atherosclerosis (ICD10-I70.0). Electronically  Signed   By: Marlaine Hind M.D.   On: 01/30/2022 13:06    Assessment and Plan   [name] is a 76 y.o. female with medical history significant of CAD with stent placement, HLD, DM, hypothyroidism, depression, OSA, endometrial cancer, diastolic CHF, recent knee replacement, CKD-3, who presents with fever,  nausea, vomiting.  Patient states that she has fever and chills for 4.5 days.  She had temperature 100.8 at home today.    CTA Chest: 1. No evidence of pulmonary embolism. 2. Trace layering bilateral pleural effusions with mild bibasilar subsegmental atelectasis. 3. Mildly dilated pulmonary trunk suggesting pulmonary arterial hypertension. 4. Aortic and coronary artery atherosclerosis.   CT Abdomen/Pelvis:  1. Non-enhancement of the left portal vein suspicious for portal vein thrombosis. Main portal vein and right portal veins enhance homogeneously. 2. Cholelithiasis with a 4.4 cm stone in the region of the gallbladder neck. Gallbladder is moderately distended with diffuse gallbladder wall thickening and mild intra and extrahepatic biliary ductal dilatation. Findings are concerning for acute cholecystitis. 3. Colonic diverticulosis without evidence of acute diverticulitis. 4. Aortic atherosclerosis (ICD10-I70.0).   US-RUQ 1. 2.6 cm gallstone at the gallbladder neck with gallbladder wall thickening. No Murphy sign. Findings worrisome for acute cholecystitis, despite the absence of a demonstrable Murphy sign. 2. Dilated common bile duct to 12 mm. No visible ductal stone.   LE venous doppler: Acute occlusive DVT in the left posterior tibial vein at the mid calf.  No DVT on the right.       Radiological Exams on Admission:  Imaging Results (Last 48 hours)  US Venous Img Lower Bilateral (DVT)   Result Date: 01/27/2022 CLINICAL DATA:  Knee replacements bilaterally in October with positive D-dimer EXAM: BILATERAL LOWER EXTREMITY VENOUS DOPPLER ULTRASOUND TECHNIQUE: Gray-scale sonography with graded compression, as well as color Doppler and duplex ultrasound were performed to evaluate the lower extremity deep venous systems from the level of the common femoral vein and including the common femoral, femoral, profunda femoral, popliteal and calf veins including the posterior tibial,  peroneal and gastrocnemius veins when visible. The superficial great saphenous vein was also interrogated. Spectral Doppler was utilized to evaluate flow at rest and with distal augmentation maneuvers in the common femoral, femoral and popliteal veins. COMPARISON:  None Available. FINDINGS: RIGHT LOWER EXTREMITY Common Femoral Vein: No evidence of thrombus. Normal compressibility, respiratory phasicity and response to augmentation. Saphenofemoral Junction: No evidence of thrombus. Normal compressibility and flow on color Doppler imaging. Profunda Femoral Vein: No evidence of thrombus. Normal compressibility and flow on color Doppler imaging. Femoral Vein: No evidence of thrombus. Normal compressibility, respiratory phasicity and response to augmentation. Popliteal Vein: No evidence of thrombus. Normal compressibility, respiratory phasicity and response to augmentation. Calf Veins: No evidence of thrombus. Normal compressibility and flow on color Doppler imaging. Superficial Great Saphenous Vein: No evidence of thrombus. Normal compressibility. Venous Reflux:  None. Other Findings:  None. LEFT LOWER EXTREMITY Common Femoral Vein: No evidence of thrombus. Normal compressibility, respiratory phasicity and response to augmentation. Saphenofemoral Junction: No evidence of thrombus. Normal compressibility and flow on color Doppler imaging. Profunda Femoral Vein: No evidence of thrombus. Normal compressibility and flow on color Doppler imaging. Femoral Vein: No evidence of thrombus. Normal compressibility, respiratory phasicity and response to augmentation. Popliteal Vein: No evidence of thrombus. Normal compressibility, respiratory phasicity and response to augmentation. Calf Veins: The posterior tibial vein is occluded at the mid calf due to acute DVT. Superficial Great Saphenous Vein: No evidence of thrombus. Normal  compressibility. Venous Reflux:  None. Other Findings:  None. IMPRESSION: Acute occlusive DVT in the left  posterior tibial vein at the mid calf. No DVT on the right. These results were called by telephone at the time of interpretation on 01/27/2022 at 6:35 pm to provider Ivor Costa , who verbally acknowledged these results. Electronically Signed   By: Placido Sou M.D.   On: 01/27/2022 18:36    US ABDOMEN LIMITED RUQ (LIVER/GB)   Result Date: 01/27/2022 CLINICAL DATA:  Right upper quadrant abdominal pain EXAM: ULTRASOUND ABDOMEN LIMITED RIGHT UPPER QUADRANT COMPARISON:  CT same day FINDINGS: Gallbladder: Gallbladder wall thickening up to 9 mm. No Murphy sign. 2.6 cm gallstone at the gallbladder neck. Findings worrisome for acute cholecystitis despite the absence of a Murphy sign. Common bile duct: Diameter: Dilated to 12 mm.  No visible ductal stone. Liver: Normal appearance of the liver parenchyma itself. Portal vein is patent on color Doppler imaging with normal direction of blood flow towards the liver. Other: None. IMPRESSION: 1. 2.6 cm gallstone at the gallbladder neck with gallbladder wall thickening. No Murphy sign. Findings worrisome for acute cholecystitis, despite the absence of a demonstrable Murphy sign. 2. Dilated common bile duct to 12 mm. No visible ductal stone. Electronically Signed   By: Nelson Chimes M.D.   On: 01/27/2022 14:41    CT Angio Chest PE W and/or Wo Contrast   Result Date: 01/27/2022 CLINICAL DATA:  Pulmonary embolism (PE) suspected, high prob; Abdominal pain, acute, nonlocalized EXAM: CT ANGIOGRAPHY CHEST CT ABDOMEN AND PELVIS WITH CONTRAST TECHNIQUE: Multidetector CT imaging of the chest was performed using the standard protocol during bolus administration of intravenous contrast. Multiplanar CT image reconstructions and MIPs were obtained to evaluate the vascular anatomy. Multidetector CT imaging of the abdomen and pelvis was performed using the standard protocol during bolus administration of intravenous contrast. RADIATION DOSE REDUCTION: This exam was performed according to  the departmental dose-optimization program which includes automated exposure control, adjustment of the mA and/or kV according to patient size and/or use of iterative reconstruction technique. CONTRAST:  67m OMNIPAQUE IOHEXOL 350 MG/ML SOLN COMPARISON:  None Available. FINDINGS: CTA CHEST FINDINGS Cardiovascular: Satisfactory opacification of the pulmonary arteries to the segmental level. No evidence of pulmonary embolism. Pulmonary trunk measures 3.5 cm in diameter. Thoracic aorta is nonaneurysmal. Scattered atherosclerotic vascular calcifications of the aorta and coronary arteries. Heart size is upper limits of normal. No pericardial effusion. Mediastinum/Nodes: No enlarged mediastinal, hilar, or axillary lymph nodes. Thyroid gland, trachea, and esophagus demonstrate no significant findings. Lungs/Pleura: Trace layering bilateral pleural effusions. Mild bibasilar subsegmental atelectasis. No pneumothorax. Musculoskeletal: No chest wall abnormality. No acute or significant osseous findings. Degenerative thoracic spondylosis. Review of the MIP images confirms the above findings. CT ABDOMEN and PELVIS FINDINGS Hepatobiliary: 4.4 cm stone in the region of the gallbladder neck. Numerous small stones and/or sludge layering within the gallbladder fundus. Gallbladder is moderately distended with diffuse gallbladder wall thickening. No focal liver lesion is identified. There is mild intra and extrahepatic biliary ductal dilatation. Pancreas: Fatty atrophy of the pancreas. No focal pancreatic lesion. No ductal dilatation or inflammatory changes. Spleen: Normal in size without focal abnormality. Adrenals/Urinary Tract: Unremarkable adrenal glands. Kidneys enhance symmetrically without solid lesion, stone, or hydronephrosis. Ureters are nondilated. Urinary bladder appears unremarkable for the degree of distention. Stomach/Bowel: Stomach is within normal limits. Appendix appears normal. Scattered colonic diverticulosis. No  evidence of bowel wall thickening, distention, or inflammatory changes. Vascular/Lymphatic: Non-enhancement of the left portal vein suspicious  for portal vein thrombosis (series 2, image 26). Main portal vein and right portal veins enhance homogeneously. Aortoiliac atherosclerosis. No abdominopelvic lymphadenopathy. Reproductive: Status post hysterectomy. No adnexal masses. Other: No free fluid. No abdominopelvic fluid collection. No pneumoperitoneum. Musculoskeletal: No acute or significant osseous findings. Degenerative lumbar spondylosis. Review of the MIP images confirms the above findings. IMPRESSION: CTA Chest: 1. No evidence of pulmonary embolism. 2. Trace layering bilateral pleural effusions with mild bibasilar subsegmental atelectasis. 3. Mildly dilated pulmonary trunk suggesting pulmonary arterial hypertension. 4. Aortic and coronary artery atherosclerosis. CT Abdomen/Pelvis: 1. Non-enhancement of the left portal vein suspicious for portal vein thrombosis. Main portal vein and right portal veins enhance homogeneously. 2. Cholelithiasis with a 4.4 cm stone in the region of the gallbladder neck. Gallbladder is moderately distended with diffuse gallbladder wall thickening and mild intra and extrahepatic biliary ductal dilatation. Findings are concerning for acute cholecystitis. 3. Colonic diverticulosis without evidence of acute diverticulitis. 4. Aortic atherosclerosis (ICD10-I70.0). Electronically Signed   By: Davina Poke D.O.   On: 01/27/2022 13:38    CT ABDOMEN PELVIS W CONTRAST   Result Date: 01/27/2022 CLINICAL DATA:  Pulmonary embolism (PE) suspected, high prob; Abdominal pain, acute, nonlocalized EXAM: CT ANGIOGRAPHY CHEST CT ABDOMEN AND PELVIS WITH CONTRAST TECHNIQUE: Multidetector CT imaging of the chest was performed using the standard protocol during bolus administration of intravenous contrast. Multiplanar CT image reconstructions and MIPs were obtained to evaluate the vascular anatomy.  Multidetector CT imaging of the abdomen and pelvis was performed using the standard protocol during bolus administration of intravenous contrast. RADIATION DOSE REDUCTION: This exam was performed according to the departmental dose-optimization program which includes automated exposure control, adjustment of the mA and/or kV according to patient size and/or use of iterative reconstruction technique. CONTRAST:  2m OMNIPAQUE IOHEXOL 350 MG/ML SOLN COMPARISON:  None Available. FINDINGS: CTA CHEST FINDINGS Cardiovascular: Satisfactory opacification of the pulmonary arteries to the segmental level. No evidence of pulmonary embolism. Pulmonary trunk measures 3.5 cm in diameter. Thoracic aorta is nonaneurysmal. Scattered atherosclerotic vascular calcifications of the aorta and coronary arteries. Heart size is upper limits of normal. No pericardial effusion. Mediastinum/Nodes: No enlarged mediastinal, hilar, or axillary lymph nodes. Thyroid gland, trachea, and esophagus demonstrate no significant findings. Lungs/Pleura: Trace layering bilateral pleural effusions. Mild bibasilar subsegmental atelectasis. No pneumothorax. Musculoskeletal: No chest wall abnormality. No acute or significant osseous findings. Degenerative thoracic spondylosis. Review of the MIP images confirms the above findings. CT ABDOMEN and PELVIS FINDINGS Hepatobiliary: 4.4 cm stone in the region of the gallbladder neck. Numerous small stones and/or sludge layering within the gallbladder fundus. Gallbladder is moderately distended with diffuse gallbladder wall thickening. No focal liver lesion is identified. There is mild intra and extrahepatic biliary ductal dilatation. Pancreas: Fatty atrophy of the pancreas. No focal pancreatic lesion. No ductal dilatation or inflammatory changes. Spleen: Normal in size without focal abnormality. Adrenals/Urinary Tract: Unremarkable adrenal glands. Kidneys enhance symmetrically without solid lesion, stone, or  hydronephrosis. Ureters are nondilated. Urinary bladder appears unremarkable for the degree of distention. Stomach/Bowel: Stomach is within normal limits. Appendix appears normal. Scattered colonic diverticulosis. No evidence of bowel wall thickening, distention, or inflammatory changes. Vascular/Lymphatic: Non-enhancement of the left portal vein suspicious for portal vein thrombosis (series 2, image 26). Main portal vein and right portal veins enhance homogeneously. Aortoiliac atherosclerosis. No abdominopelvic lymphadenopathy. Reproductive: Status post hysterectomy. No adnexal masses. Other: No free fluid. No abdominopelvic fluid collection. No pneumoperitoneum. Musculoskeletal: No acute or significant osseous findings. Degenerative lumbar spondylosis. Review of the  MIP images confirms the above findings. IMPRESSION: CTA Chest: 1. No evidence of pulmonary embolism. 2. Trace layering bilateral pleural effusions with mild bibasilar subsegmental atelectasis. 3. Mildly dilated pulmonary trunk suggesting pulmonary arterial hypertension. 4. Aortic and coronary artery atherosclerosis. CT Abdomen/Pelvis: 1. Non-enhancement of the left portal vein suspicious for portal vein thrombosis. Main portal vein and right portal veins enhance homogeneously. 2. Cholelithiasis with a 4.4 cm stone in the region of the gallbladder neck. Gallbladder is moderately distended with diffuse gallbladder wall thickening and mild intra and extrahepatic biliary ductal dilatation. Findings are concerning for acute cholecystitis. 3. Colonic diverticulosis without evidence of acute diverticulitis. 4. Aortic atherosclerosis (ICD10-I70.0). Electronically Signed   By: Davina Poke D.O.   On: 01/27/2022 13:38    DG Chest 2 View   Result Date: 01/27/2022 CLINICAL DATA:  Fevers EXAM: CHEST - 2 VIEW COMPARISON:  08/03/16 CXR FINDINGS: No pleural effusion. No pneumothorax. Normal cardiac and mediastinal contours. No displaced rib fractures.  Degenerative changes of the bilateral glenohumeral joints, progressed on the right compared to 2018. Vertebral body heights are maintained. Rounded densities in the upper abdomen only visualized on the lateral view could represent a combination of renal and gallstones. IMPRESSION: 1.  No active cardiopulmonary disease. 2.  Possible small left sided renal stone. Electronically Signed   By: Marin Roberts M.D.   On: 01/27/2022 10:06              Assessment and Plan:   NSTEMI (non-ST elevated myocardial infarction) and hx of CAD: s/p of stent Consulted Dr. Annette Stable -IV heparin gtt -ASA, Zetia and Crestor -trend trop peaked 5000 -echo done --Last stent placed in 2014 --Myoview stress test done results pending   Klebsiella Sepsis Acute Cholecystitis/Gall stones: WBC 16.4, temperature 98.4, heart rate 72, RR 24, does not meet criteria for sepsis.  -- Consulted Dr. Lysle Pearl of surgery--recommends GB drain placement per IR, surgery signed off. F/u outpt  -IV Zosyn in ED).  Patient has dilated CBD.   --Consulted Dr. Haig Prophet of GI. -Blood culture 1/2 Colette Ribas -As needed morphine, Zofran, oxycodone -MRCP noted --trial of Toradol --pt has continued pain with increased wbc to 44K--repeated CT abd with contrast--shows abnormal position of the GB drain. Dr Haig Prophet aware --I have paged/sent chat message to IR Dr Denna Haggard   Controlled type 2 diabetes mellitus with diabetic nephropathy [facility]): Recent A1c 6.6, well-controlled.  Patient taking metformin and Ozempic at home. -SSI   HTN (hypertension) -Hold home blood pressure medications including amlodipine, Coreg, Cozaar due to soft blood pressure -IV hydralazine as needed  Acute Left post tibial vein DVT (deep venous thrombosis) ([place]): CTA negative for PE -IV heparin --will change to po DOAC after cardiac w/u is completed   Hypothyroidism -Synthroid   Hyperlipidemia associated with type 2 diabetes mellitus (HCC) -Cresto and Zetia    Chronic diastolic CHF (congestive heart failure) ([place]): Recent 2D echo 11/26/2020 showed EF of 50-55% with grade 2 diastolic dysfunction.  Patient has trace leg edema, does not seem to have CHF exacerbation. -stable   Thrombocytopenia ([place]): Platelet 118 -LDH  120 and peripheral smear plt normal morphology   Major depressive disorder, recurrent, in remission ([place]) -Continue home medications   Obstructive sleep apnea - on CPAP   Acute pn Chronic kidney disease, stage 3a ([place]): Renal function is worsening than baseline.  Recent baseline creatinine 0.9 01/11/2022.  Her creatinine is 1.32, BUN 45. -IV fluid: 500 cc normal saline, -Hold Cozaar --creat upto 2.29 (baseline 0.9 --12/21/21)--will increase  IVF. Pt got contrast dye, IV toradol (other pain meds did not work) --consider nephrology consultation if creat shows no improvement   Portal vein thrombosis -On IV heparin   Obesity (BMI 30.0-34.9): BMI= 35.99  and BW=101.2 -Diet and exercise.   -Encourage to lose weight.       Procedures s/p GB drain placement by IR on 01/28/2022 Family communication :dter Aimee Consults : general surgery, cardiology, G.I., IR CODE STATUS: full code DVT Prophylaxis : heparin drip Level of care: Progressive Status is: Inpatient Remains inpatient appropriate because: sepsis due to acute cholecystitis, non-STEMI, Acute DVT    TOTAL TIME TAKING CARE OF THIS PATIENT: 35 minutes.  >50% time spent on counselling and coordination of care  Note: This dictation was prepared with Dragon dictation along with smaller phrase technology. Any transcriptional errors that result from this process are unintentional.  Fritzi Mandes M.D    [facility]   CC: Primary care physician; Olin Hauser, DO

## 2022-01-31 ENCOUNTER — Inpatient Hospital Stay: Payer: Medicare Other | Admitting: Radiology

## 2022-01-31 DIAGNOSIS — I214 Non-ST elevation (NSTEMI) myocardial infarction: Secondary | ICD-10-CM | POA: Diagnosis not present

## 2022-01-31 HISTORY — PX: IR PERC CHOLECYSTOSTOMY: IMG2326

## 2022-01-31 LAB — COMPREHENSIVE METABOLIC PANEL
ALT: 11 U/L (ref 0–44)
AST: 19 U/L (ref 15–41)
Albumin: 1.7 g/dL — ABNORMAL LOW (ref 3.5–5.0)
Alkaline Phosphatase: 96 U/L (ref 38–126)
Anion gap: 13 (ref 5–15)
BUN: 42 mg/dL — ABNORMAL HIGH (ref 8–23)
CO2: 12 mmol/L — ABNORMAL LOW (ref 22–32)
Calcium: 7.1 mg/dL — ABNORMAL LOW (ref 8.9–10.3)
Chloride: 106 mmol/L (ref 98–111)
Creatinine, Ser: 2.54 mg/dL — ABNORMAL HIGH (ref 0.44–1.00)
GFR, Estimated: 19 mL/min — ABNORMAL LOW (ref >60)
Glucose, Bld: 125 mg/dL — ABNORMAL HIGH (ref 70–99)
Potassium: 4.1 mmol/L (ref 3.5–5.1)
Sodium: 131 mmol/L — ABNORMAL LOW (ref 135–145)
Total Bilirubin: 1.7 mg/dL — ABNORMAL HIGH (ref 0.3–1.2)
Total Protein: 5.1 g/dL — ABNORMAL LOW (ref 6.5–8.1)

## 2022-01-31 LAB — CBC
HCT: 27.7 % — ABNORMAL LOW (ref 36.0–46.0)
Hemoglobin: 8.6 g/dL — ABNORMAL LOW (ref 12.0–15.0)
MCH: 29.6 pg (ref 26.0–34.0)
MCHC: 31 g/dL (ref 30.0–36.0)
MCV: 95.2 fL (ref 80.0–100.0)
Platelets: 205 10*3/uL (ref 150–400)
RBC: 2.91 MIL/uL — ABNORMAL LOW (ref 3.87–5.11)
RDW: 15.1 % (ref 11.5–15.5)
WBC: 47.4 10*3/uL — ABNORMAL HIGH (ref 4.0–10.5)
nRBC: 0 % (ref 0.0–0.2)

## 2022-01-31 LAB — GLUCOSE, CAPILLARY
Glucose-Capillary: 126 mg/dL — ABNORMAL HIGH (ref 70–99)
Glucose-Capillary: 109 mg/dL — ABNORMAL HIGH (ref 70–99)
Glucose-Capillary: 139 mg/dL — ABNORMAL HIGH (ref 70–99)

## 2022-01-31 LAB — HEPARIN LEVEL (UNFRACTIONATED): Heparin Unfractionated: <0.1 IU/mL — ABNORMAL LOW (ref 0.30–0.70)

## 2022-01-31 LAB — MRSA NEXT GEN BY PCR, NASAL: MRSA by PCR Next Gen: NOT DETECTED

## 2022-01-31 MED ORDER — ALPRAZOLAM 0.25 MG PO TABS
0.2500 mg | ORAL_TABLET | Freq: Two times a day (BID) | ORAL | Status: DC | PRN
  Administered 2022-01-31 – 2022-02-01 (×2): 0.25 mg via ORAL
  Filled 2022-01-31 (×2): qty 1

## 2022-01-31 MED ORDER — IOHEXOL 300 MG/ML  SOLN
8.0000 mL | Freq: Once | INTRAMUSCULAR | Status: AC | PRN
  Administered 2022-01-31: 8 mL

## 2022-01-31 MED ORDER — HEPARIN (PORCINE) 25000 UT/250ML-% IV SOLN
1400.0000 [IU]/h | INTRAVENOUS | Status: DC
  Administered 2022-01-31 – 2022-02-01 (×3): 1550 [IU]/h via INTRAVENOUS
  Filled 2022-01-31 (×3): qty 250

## 2022-01-31 MED ORDER — NOREPINEPHRINE 4 MG/250ML-% IV SOLN
2.0000 ug/min | INTRAVENOUS | Status: DC
  Administered 2022-01-31: 2 ug/min via INTRAVENOUS
  Administered 2022-02-01: 10 ug/min via INTRAVENOUS
  Filled 2022-01-31: qty 250
  Filled 2022-01-31: qty 500

## 2022-01-31 MED ORDER — SODIUM CHLORIDE 0.9 % IV BOLUS
250.0000 mL | Freq: Once | INTRAVENOUS | Status: AC
  Administered 2022-01-31: 250 mL via INTRAVENOUS

## 2022-01-31 MED ORDER — CHLORHEXIDINE GLUCONATE CLOTH 2 % EX PADS
6.0000 | MEDICATED_PAD | Freq: Every day | CUTANEOUS | Status: DC
  Administered 2022-02-01 – 2022-02-04 (×4): 6 via TOPICAL

## 2022-01-31 MED ORDER — LIDOCAINE HCL 1 % IJ SOLN
INTRAMUSCULAR | Status: AC
  Administered 2022-01-31: 10 mL
  Filled 2022-01-31: qty 20

## 2022-01-31 MED ORDER — HYDROMORPHONE HCL 1 MG/ML IJ SOLN
1.0000 mg | INTRAMUSCULAR | Status: DC | PRN
  Administered 2022-01-31 – 2022-02-01 (×6): 1 mg via INTRAVENOUS
  Filled 2022-01-31 (×6): qty 1

## 2022-01-31 MED ORDER — SODIUM CHLORIDE 0.9 % IV SOLN
250.0000 mL | INTRAVENOUS | Status: DC
  Administered 2022-01-31: 250 mL via INTRAVENOUS

## 2022-01-31 NOTE — Progress Notes (Signed)
Rounding Note    Patient Name: [name] Date of Encounter: 01/31/2022  [place] Cardiologist: Ida Rogue, MD   Subjective   Feeling tired.  Abdominal pain still present but better.   Inpatient Medications    Scheduled Meds:  aspirin EC  81 mg Oral Daily   [START ON 02/01/2022] Chlorhexidine Gluconate Cloth  6 each Topical Q0600   ezetimibe  10 mg Oral Daily   FLUoxetine  10 mg Oral Daily   insulin aspart  0-5 Units Subcutaneous QHS   insulin aspart  0-9 Units Subcutaneous TID WC   levothyroxine  125 mcg Oral Q0600   polyethylene glycol  17 g Oral Daily   rosuvastatin  20 mg Oral Daily   sodium chloride flush  5 mL Intracatheter Q8H   Continuous Infusions:  sodium chloride 100 mL/hr at 01/31/22 1124   sodium chloride     norepinephrine (LEVOPHED) Adult infusion 2 mcg/min (01/31/22 1335)   piperacillin-tazobactam (ZOSYN)  IV 3.375 g (01/31/22 0515)   PRN Meds: acetaminophen, bisacodyl, HYDROmorphone (DILAUDID) injection, levalbuterol, nitroGLYCERIN, ondansetron (ZOFRAN) IV, ondansetron, [COMPLETED] senna-docusate **FOLLOWED BY** senna-docusate   Vital Signs    Vitals:   01/31/22 1205 01/31/22 1230 01/31/22 1236 01/31/22 1300  BP: 104/64 (!) 73/38 (!) 89/43 (!) 82/32  Pulse:  77 78 74  Resp: '19 13 14 10  '$ Temp: 97.8 F (36.6 C)     TempSrc: Axillary     SpO2: 96% 97% 99% 97%  Weight: 107.4 kg     Height:        Intake/Output Summary (Last 24 hours) at 01/31/2022 1345 Last data filed at 01/31/2022 0733 Gross per 24 hour  Intake 2413.96 ml  Output 120 ml  Net 2293.96 ml      01/31/2022   12:05 PM 01/31/2022    5:00 AM 01/29/2022    4:22 AM  Last 3 Weights  Weight (lbs) 236 lb 12.4 oz 241 lb 10 oz 227 lb 11.8 oz  Weight (kg) 107.4 kg 109.6 kg 103.3 kg      Telemetry    Sinus rhythm.  No events.  - Personally Reviewed  ECG    N/a- Personally Reviewed  Physical Exam   VS:  BP (!) 100/47   Pulse 79   Temp 97.8 F (36.6 C)  (Axillary)   Resp 19   Ht '5\' 6"'$  (1.676 m)   Wt 107.4 kg   SpO2 99%   BMI 38.22 kg/m  , BMI Body mass index is 38.22 kg/m. GENERAL:  Ill-appearing HEENT: Pupils equal round and reactive, fundi not visualized, oral mucosa unremarkable NECK:  No jugular venous distention, waveform within normal limits, carotid upstroke brisk and symmetric, no bruits, no thyromegaly LUNGS:  Clear to auscultation bilaterally HEART:  RRR.  PMI not displaced or sustained,S1 and S2 within normal limits, no S3, no S4, no clicks, no rubs, no murmurs ABD:  Flat, positive bowel sounds normal in frequency in pitch, no bruits, no rebound, no guarding, no midline pulsatile mass, no hepatomegaly, no splenomegaly EXT:  2 plus pulses throughout, 1+ LE edema, no cyanosis no clubbing SKIN:  No rashes no nodules NEURO:  Cranial nerves II through XII grossly intact, motor grossly intact throughout PSYCH:  Cognitively intact, oriented to person place and time   Labs    High Sensitivity Troponin:   Recent Labs  Lab 01/27/22 0942 01/27/22 1450 01/27/22 1909 01/27/22 2242 01/28/22 0322  TROPONINIHS 865* 1,003* 3,514* 5,864* 3,344*  Chemistry Recent Labs  Lab 01/27/22 0942 01/28/22 0322 01/29/22 0805 01/30/22 1330 01/31/22 0508  NA 132*   < > 129* 128* 131*  K 3.6   < > 4.3 3.9 4.1  CL 102   < > 101 101 106  CO2 23   < > 15* 15* 12*  GLUCOSE 133*   < > 213* 153* 125*  BUN 45*   < > 36* 42* 42*  CREATININE 1.32*   < > 1.46* 2.29* 2.54*  CALCIUM 7.5*   < > 7.7* 7.3* 7.1*  PROT 6.0*  --   --   --  5.1*  ALBUMIN 2.3*  --   --   --  1.7*  AST 35  --   --   --  19  ALT 20  --   --   --  11  ALKPHOS 115  --   --   --  96  BILITOT 0.6  --   --   --  1.7*  GFRNONAA 42*   < > 37* 22* 19*  ANIONGAP 7   < > '13 12 13   '$ < > = values in this interval not displayed.    Lipids  Recent Labs  Lab 01/27/22 1450  CHOL 51  TRIG 177*  HDL <10*  LDLCALC UNABLE TO CALCULATE  CHOLHDL UNABLE TO CALCULATE     Hematology Recent Labs  Lab 01/29/22 0822 01/30/22 0716 01/31/22 0508  WBC 25.5* 44.6* 47.4*  RBC 3.63* 3.30* 2.91*  HGB 10.7* 9.7* 8.6*  HCT 33.7* 30.8* 27.7*  MCV 92.8 93.3 95.2  MCH 29.5 29.4 29.6  MCHC 31.8 31.5 31.0  RDW 14.5 14.7 15.1  PLT 141* 186 205   Thyroid No results for input(s): "TSH", "FREET4" in the last 168 hours.  BNP Recent Labs  Lab 01/27/22 0952  BNP 38.4    DDimer  Recent Labs  Lab 01/27/22 1048  DDIMER 10.28*     Radiology    IR Perc Cholecystostomy  Result Date: 01/31/2022 INDICATION: Dislodged cholecystostomy tube, leukocytosis and worsening right upper quadrant pain EXAM: Placement of percutaneous cholecystostomy tube using ultrasound and fluoroscopic guidance MEDICATIONS: Per EMR ANESTHESIA/SEDATION: Local analgesia FLUOROSCOPY: Radiation Exposure Index (as provided by the fluoroscopic device): 2.0 minutes (42 mGy) COMPLICATIONS: None immediate. PROCEDURE: Informed written consent was obtained from the patient after a thorough discussion of the procedural risks, benefits and alternatives. All questions were addressed. Maximal Sterile Barrier Technique was utilized including caps, mask, sterile gowns, sterile gloves, sterile drape, hand hygiene and skin antiseptic. A timeout was performed prior to the initiation of the procedure. The patient was placed supine on the exam table. The existing cholecystostomy tube was removed, as it was noted to be dislodged base on recent cross-sectional imaging. The right upper quadrant was prepped and draped in the standard sterile fashion. Ultrasound of the right upper quadrant was performed for planning purposes. This again demonstrated a distended gallbladder with wall edema and large gallstone, consistent with acute cholecystitis. An intercostal transhepatic approach was planned. A relatively high intercostal access was used due to known colonic interposition. Skin entry site was marked, and local analgesia was  obtained with 1% lidocaine. Under ultrasound guidance, percutaneous access was obtained into the gallbladder via an intercostal transhepatic approach using a 21 gauge Chiba needle. Access was confirmed with visualization of needle tip within the gallbladder lumen, and free return of bile. An 018 Nitrex wire was then advanced through the access needle and coiled within the  gallbladder lumen. A transition dilator was advanced over this wire, through which an antegrade cholecystogram was performed. Antegrade cholecystogram demonstrated appropriate location in the gallbladder lumen with large central gallstone. Over an Amplatz wire, the percutaneous tract was serially dilated followed by placement of a 10 French locking multipurpose drainage catheter into the gallbladder lumen. Locking loop was formed. Additional biliary sludge was drained. Gentle hand injection of contrast material confirmed location of the gallbladder lumen. The drainage catheter was secured to the skin using silk suture and a dressing. It was placed to bag drainage. The patient tolerated the procedure well without immediate complication. IMPRESSION: Successful placement of a new 10 French percutaneous cholecystostomy tube via intercostal approach. Cholecystostomy tube placed to bag drainage. Electronically Signed   By: Albin Felling M.D.   On: 01/31/2022 12:01   NM Myocar Multi W/Spect W/Wall Motion / EF  Result Date: 01/30/2022   The study is normal. Findings are consistent with no ischemia and no prior myocardial infarction. The study is low risk.   No ST deviation was noted.   LV perfusion is normal. There is no evidence of ischemia. There is no evidence of infarction.   Left ventricular function is normal. Nuclear stress EF: 60 %. The left ventricular ejection fraction is normal (55-65%). End diastolic cavity size is normal. End systolic cavity size is normal.   CT ABDOMEN W CONTRAST  Result Date: 01/30/2022 CLINICAL DATA:  Acute  abdominal pain. Cholecystitis. Status post percutaneous cholecystostomy. EXAM: CT ABDOMEN WITH CONTRAST TECHNIQUE: Multidetector CT imaging of the abdomen was performed using the standard protocol following bolus administration of intravenous contrast. RADIATION DOSE REDUCTION: This exam was performed according to the departmental dose-optimization program which includes automated exposure control, adjustment of the mA and/or kV according to patient size and/or use of iterative reconstruction technique. CONTRAST:  141m OMNIPAQUE IOHEXOL 300 MG/ML  SOLN COMPARISON:  01/27/2022 FINDINGS: Lower chest: Mild dependent atelectasis in both lung bases. Hepatobiliary: No hepatic masses or abscess identified. Thrombosis of the left portal vein is again seen. Large gallstone and diffuse gallbladder wall thickening unchanged in appearance, consistent with acute cholecystitis. Mild biliary ductal dilatation remains stable. A cholecystostomy tube is seen in abnormal position, with the pigtail loop located in the left perihepatic space. Mild perihepatic ascites is noted. Pancreas:  No mass or inflammatory changes. Spleen:  Within normal limits in size and appearance. Adrenals/Urinary Tract: No suspicious masses identified. No evidence of hydronephrosis. Stomach/Bowel: Increased gaseous distention of nondependent transverse colon, consistent with colonic ileus. Vascular/Lymphatic: No pathologically enlarged lymph nodes identified. No acute vascular findings. Aortic atherosclerotic calcification incidentally noted. Other: Mild bilateral upper quadrant ascites. Mild diffuse body wall edema. Musculoskeletal:  No suspicious bone lesions identified. IMPRESSION: Cholecystostomy tube in abnormal position, with the pigtail loop located in the left perihepatic space. Stable findings consistent with acute cholecystitis. Stable mild biliary ductal dilatation. Stable left portal vein thrombosis. New mild ascites and diffuse body wall edema.  Mild colonic ileus. Aortic Atherosclerosis (ICD10-I70.0). Electronically Signed   By: JMarlaine HindM.D.   On: 01/30/2022 13:06    Cardiac Studies   Echo 01/28/22  1. Left ventricular ejection fraction, by estimation, is 60 to 65%. The  left ventricle has normal function. The left ventricle has no regional  wall motion abnormalities. Left ventricular diastolic parameters are  consistent with Grade I diastolic  dysfunction (impaired relaxation).   2. Right ventricular systolic function is normal. The right ventricular  size is normal. Tricuspid regurgitation signal is inadequate  for assessing  PA pressure.   3. The mitral valve is normal in structure. No evidence of mitral valve  regurgitation. No evidence of mitral stenosis.   4. The aortic valve is normal in structure. Aortic valve regurgitation is  not visualized. No aortic stenosis is present.   5. The inferior vena cava is normal in size with greater than 50%  respiratory variability, suggesting right atrial pressure of 3 mmHg.   Lexiscan Myoview 01/30/22:   The study is normal. Findings are consistent with no ischemia and no prior myocardial infarction. The study is low risk.   No ST deviation was noted.   LV perfusion is normal. There is no evidence of ischemia. There is no evidence of infarction.   Left ventricular function is normal. Nuclear stress EF: 60 %. The left ventricular ejection fraction is normal (55-65%). End diastolic cavity size is normal. End systolic cavity size is normal.    Patient Profile     76 y.o. female with CAD status post PCI, diabetes, hypertension, hypothyroidism, hyperlipidemia admitted with acute cholecystitis.  Hospitalization also complicated by acute DVT acute renal failure.  Cardiology consulted for elevated troponin.  Assessment & Plan    #Demand ischemia: #CAD status post LAD PCI: High-sensitivity troponin elevated to 5864.  However she has not had any chest pain and echo was unremarkable.   She had a Lexiscan Myoview yesterday that was negative for ischemia.  Elevated troponin is in the setting of demand ischemia no further intervention required.  Continue aspirin, rosuvastatin, and Zetia.  #Acute cholecystitis: #Dilated bile duct, gallstones: #Klebsiella sepsis: Status post biliary drain.  The drain was not working properly and was repositioned today.  She remains on IV antibiotics.  #DVT: CTA was negative for PE.  She was on IV heparin and is stable to transition to [place] when stable per primary team and GI.  #Chronic diastolic heart failure: Grade 1 diastolic dysfunction on echo this admission.  Avoid volume overload.  #AKI: Nephrology following.  Thought to be due to ATN in the setting of hypotension, sepsis, contrast exposure, and NSAIDs.  Agree with reducing IV fluids to avoid volume overload.   [place] will sign off.   Medication Recommendations: resume home antihypertensives as able.  Switch heparin to DOAC when stable per GI/primary team Other recommendations (labs, testing, etc):   Follow up as an outpatient:  within one month of discharge  For questions or updates, please contact [place] Please consult [email or web address] for contact info under        Signed, Skeet Latch, MD  01/31/2022, 1:45 PM

## 2022-01-31 NOTE — Procedures (Signed)
Interventional Radiology Procedure Note  Date of Procedure: 01/31/2022  Procedure: IR cholecystostomy tube   Findings:  1. Placement of new 10 Fr percutaneous cholecystostomy tube, placed to bag drainage    Complications: No immediate complications noted.   Estimated Blood Loss: minimal  Follow-up and Recommendations: 1. Keep to bag drain  2. RN attention to old tube access site, which is leaking bile. This is expected from old tube, and will require more frequent dressing changes to prevent skin breakdown. Output would be expected to decrease over next 24-48 hours.    Albin Felling, MD  Vascular & Interventional Radiology  01/31/2022 10:14 AM

## 2022-01-31 NOTE — Procedures (Signed)
Central Venous Catheter Insertion Procedure Note  [name]  [id number]  01/30/1946  Date:01/31/22  Time:11:51 PM   Provider Performing:Deannah Rossi A Maris Bena   Procedure: Insertion of Non-tunneled Central Venous [phone]) with US guidance ([id number])   Indication(s) Medication administration and Difficult access  Consent Risks of the procedure as well as the alternatives and risks of each were explained to the patient and/or caregiver.  Consent for the procedure was obtained and is signed in the bedside chart  Anesthesia Topical only with 1% lidocaine   Timeout Verified patient identification, verified procedure, site/side was marked, verified correct patient position, special equipment/implants available, medications/allergies/relevant history reviewed, required imaging and test results available.  Sterile Technique Maximal sterile technique including full sterile barrier drape, hand hygiene, sterile gown, sterile gloves, mask, hair covering, sterile ultrasound probe cover (if used).  Procedure Description Area of catheter insertion was cleaned with chlorhexidine and draped in sterile fashion.  With real-time ultrasound guidance a central venous catheter was placed into the right internal jugular vein. Nonpulsatile blood flow and easy flushing noted in all ports.  The catheter was sutured in place and sterile dressing applied.  Complications/Tolerance None; patient tolerated the procedure well. Chest X-ray is ordered to verify placement for internal jugular or subclavian cannulation.   Chest x-ray is not ordered for femoral cannulation.  EBL Minimal  Specimen(s) None  [name], DNP, CCRN, FNP-C, AGACNP-BC Acute Care & Family Nurse Practitioner  [facility]  See Amion for personal pager PCCM on call pager [phone] until 7 am

## 2022-01-31 NOTE — Consult Note (Signed)
[facility] Consult Note:    Date of Admission:  01/27/2022           Reason for Consult:     Referring Provider: Fritzi Mandes, MD Primary Care Provider: Olin Hauser, DO   History of Presenting Illness:  [name] is a 76 y.o. female Who is seen in consultation for acute kidney injury She presented on 01/28/2019 today for fever, chills for 4 to 5 days prior to admission.  She also reported nonbilious, nonbloody vomiting.  Initially on presentation, blood pressure was 80/48 which improved with IV saline.  2.6 cm gallbladder stone was noted.  She also had an acute occlusive DVT in the left posterior tibial vein at the mid calf.  Patient has had a complicated hospital course.  She underwent CT angiogram with contrast on 01/30/2022 which showed acute cholecystitis, new ascites, diffuse body wall edema, aortic atherosclerosis.  The gallbladder drain had dislodged, but was replaced earlier today by interventional radiologist. Baseline creatinine is 0.93 from 12/11/2021.  Creatinine has progressively increased since admission, 1.2-2.5 today. Patient generally feels poor.  Has dry mouth.  Appetite is poor.   Review of Systems: Review of Systems  Constitutional:  Positive for chills, fever and malaise/fatigue.  HENT: Negative.    Eyes: Negative.   Respiratory: Negative.    Cardiovascular:  Positive for leg swelling.  Gastrointestinal:  Positive for abdominal pain, nausea and vomiting. Negative for blood in stool.  Genitourinary:  Negative for dysuria, flank pain, frequency, hematuria and urgency.  Musculoskeletal: Negative.   Skin: Negative.   Neurological: Negative.   Endo/Heme/Allergies: Negative.   Psychiatric/Behavioral: Negative.      Past Medical History:  Diagnosis Date   Coronary artery disease    a. 07/2013 STEMI/PCI ([place]): LM nl, LAD 100p (thrombectomy & 3.5x23 Alpine DES), LCX nl, RCA nl, EF 45%.   Depression    Diastolic dysfunction     a.) TTE 11/26/2020 - LVEF 50-55%; G2DD.   Endometrial cancer ([place])    Gravida 3 para 3    Hyperlipidemia    Hypertension    Hypertensive heart disease    Hypothyroid    Ischemic cardiomyopathy    a.) 07/2013 EF 45% @ time of STEMI; b.) 11/2013 Echo: EF 55-60%, mildly dil LA, nl RV fxn. c.) TTE 07/29/2015 - LVEF 55-60%, mild LA and RV dilation. d.) TTE 11/26/2020 - LVEF 50-55%; G2DD.   Menopause    Obstructive sleep apnea on CPAP    Osteoarthritis    PONV (postoperative nausea and vomiting)    ST elevation myocardial infarction (STEMI) of anterolateral wall ([place]) 08/14/2013   a.) LVEF reduced at 45%; Tx'd with trombectomy and PCI to 100% LAD; 3.5 x 23 mm Xience Alpine DES placed.   T2DM (type 2 diabetes mellitus) ([place])    Thyroid cancer ([place])    a.) s/p total thyroidectomy. b.) on daily levothyroxine.    Social History   Tobacco Use   Smoking status: Never   Smokeless tobacco: Never  Vaping Use   Vaping Use: Never used  Substance Use Topics   Alcohol use: No   Drug use: No    Family History  Problem Relation Age of Onset   Heart attack Father    Heart failure Father    Hypertension Father    Hyperlipidemia Father    Heart disease Father 53       CABG    Pulmonary fibrosis Son    Breast cancer Neg Hx  OBJECTIVE: Blood pressure (!) 106/46, pulse 78, temperature 98 F (36.7 C), resp. rate 16, height '5\' 6"'$  (1.676 m), weight 109.6 kg, SpO2 100 %.  Physical Exam Physical Exam: General:  No acute distress, laying in the bed  HEENT  anicteric, dry oral mucous membrane  Pulm/lungs  normal breathing effort, lungs are clear to auscultation  CVS/Heart  regular rhythm, no rub or gallop  Abdomen:   Soft, obese, mild left LQ tenderness, GB drain in place  Extremities:  Trace peripheral edema  Neurologic:  Alert, oriented, able to follow commands  Skin:  No acute rashes     Lab Results Lab Results  Component Value Date   WBC 47.4 (H) 01/31/2022   HGB 8.6 (L)  01/31/2022   HCT 27.7 (L) 01/31/2022   MCV 95.2 01/31/2022   PLT 205 01/31/2022    Lab Results  Component Value Date   CREATININE 2.54 (H) 01/31/2022   BUN 42 (H) 01/31/2022   NA 131 (L) 01/31/2022   K 4.1 01/31/2022   CL 106 01/31/2022   CO2 12 (L) 01/31/2022    Lab Results  Component Value Date   ALT 11 01/31/2022   AST 19 01/31/2022   ALKPHOS 96 01/31/2022   BILITOT 1.7 (H) 01/31/2022     Microbiology: Recent Results (from the past 240 hour(s))  Blood culture (routine x 2)     Status: Abnormal   Collection Time: 01/27/22 10:48 AM   Specimen: BLOOD  Result Value Ref Range Status   Specimen Description   Final    BLOOD BLOOD LEFT ARM Performed at [facility], [address] [place], [place] [address]    Special Requests   Final    BOTTLES DRAWN AEROBIC AND ANAEROBIC Blood Culture results may not be optimal due to an excessive volume of blood received in culture bottles Performed at [facility], [address] [place], [place] [address]    Culture  Setup Time   Final    GRAM NEGATIVE RODS AEROBIC BOTTLE ONLY CRITICAL RESULT CALLED TO, READ BACK BY AND VERIFIED WITH: Loura Back PHARMD 0800 01/28/22 HNM Performed at [facility], [address] [place], [place] [address]    Culture KLEBSIELLA PNEUMONIAE (A)  Final   Report Status 01/30/2022 FINAL  Final   Organism ID, Bacteria KLEBSIELLA PNEUMONIAE  Final      Susceptibility   Klebsiella pneumoniae - MIC*    AMPICILLIN >=32 RESISTANT Resistant     CEFAZOLIN <=4 SENSITIVE Sensitive     CEFEPIME <=0.12 SENSITIVE Sensitive     CEFTAZIDIME <=1 SENSITIVE Sensitive     CEFTRIAXONE <=0.25 SENSITIVE Sensitive     CIPROFLOXACIN <=0.25 SENSITIVE Sensitive     GENTAMICIN <=1 SENSITIVE Sensitive     IMIPENEM <=0.25 SENSITIVE Sensitive     TRIMETH/SULFA <=20 SENSITIVE Sensitive     AMPICILLIN/SULBACTAM 4 SENSITIVE Sensitive     PIP/TAZO <=4 SENSITIVE Sensitive     * KLEBSIELLA  PNEUMONIAE  Resp Panel by RT-PCR (Flu A&B, Covid) Urine, Clean Catch     Status: None   Collection Time: 01/27/22 10:48 AM   Specimen: Urine, Clean Catch; Nasal Swab  Result Value Ref Range Status   SARS Coronavirus 2 by RT PCR NEGATIVE NEGATIVE Final    Comment: (NOTE) SARS-CoV-2 target nucleic acids are NOT DETECTED.  The SARS-CoV-2 RNA is generally detectable in upper respiratory specimens during the acute phase of infection. The lowest concentration of SARS-CoV-2 viral copies this assay can detect  is 138 copies/mL. A negative result does not preclude SARS-Cov-2 infection and should not be used as the sole basis for treatment or other patient management decisions. A negative result may occur with  improper specimen collection/handling, submission of specimen other than nasopharyngeal swab, presence of viral mutation(s) within the areas targeted by this assay, and inadequate number of viral copies(<138 copies/mL). A negative result must be combined with clinical observations, patient history, and epidemiological information. The expected result is Negative.  Fact Sheet for Patients:  EntrepreneurPulse.com.au  Fact Sheet for Healthcare Providers:  IncredibleEmployment.be  This test is no t yet approved or cleared by the [place] FDA and  has been authorized for detection and/or diagnosis of SARS-CoV-2 by FDA under an Emergency Use Authorization (EUA). This EUA will remain  in effect (meaning this test can be used) for the duration of the COVID-19 declaration under Section 564(b)(1) of the Act, 21 U.S.C.section 360bbb-3(b)(1), unless the authorization is terminated  or revoked sooner.       Influenza A by PCR NEGATIVE NEGATIVE Final   Influenza B by PCR NEGATIVE NEGATIVE Final    Comment: (NOTE) The Xpert Xpress SARS-CoV-2/FLU/RSV plus assay is intended as an aid in the diagnosis of influenza from Nasopharyngeal swab specimens  and should not be used as a sole basis for treatment. Nasal washings and aspirates are unacceptable for Xpert Xpress SARS-CoV-2/FLU/RSV testing.  Fact Sheet for Patients: EntrepreneurPulse.com.au  Fact Sheet for Healthcare Providers: IncredibleEmployment.be  This test is not yet approved or cleared by the [place] FDA and has been authorized for detection and/or diagnosis of SARS-CoV-2 by FDA under an Emergency Use Authorization (EUA). This EUA will remain in effect (meaning this test can be used) for the duration of the COVID-19 declaration under Section 564(b)(1) of the Act, 21 U.S.C. section 360bbb-3(b)(1), unless the authorization is terminated or revoked.  Performed at [facility], [address] [place], [place] [address]   Blood Culture ID Panel (Reflexed)     Status: Abnormal   Collection Time: 01/27/22 10:48 AM  Result Value Ref Range Status   Enterococcus faecalis NOT DETECTED NOT DETECTED Final   Enterococcus Faecium NOT DETECTED NOT DETECTED Final   Listeria monocytogenes NOT DETECTED NOT DETECTED Final   Staphylococcus species NOT DETECTED NOT DETECTED Final   Staphylococcus aureus (BCID) NOT DETECTED NOT DETECTED Final   Staphylococcus epidermidis NOT DETECTED NOT DETECTED Final   Staphylococcus lugdunensis NOT DETECTED NOT DETECTED Final   Streptococcus species NOT DETECTED NOT DETECTED Final   Streptococcus agalactiae NOT DETECTED NOT DETECTED Final   Streptococcus pneumoniae NOT DETECTED NOT DETECTED Final   Streptococcus pyogenes NOT DETECTED NOT DETECTED Final   A.calcoaceticus-baumannii NOT DETECTED NOT DETECTED Final   Bacteroides fragilis NOT DETECTED NOT DETECTED Final   Enterobacterales DETECTED (A) NOT DETECTED Final    Comment: Enterobacterales represent a large order of gram negative bacteria, not a single organism. CRITICAL RESULT CALLED TO, READ BACK BY AND VERIFIED WITH: GREGORY GREENWOOD PHARMD  0800 01/28/22 HNM    Enterobacter cloacae complex NOT DETECTED NOT DETECTED Final   Escherichia coli NOT DETECTED NOT DETECTED Final   Klebsiella aerogenes NOT DETECTED NOT DETECTED Final   Klebsiella oxytoca NOT DETECTED NOT DETECTED Final   Klebsiella pneumoniae DETECTED (A) NOT DETECTED Final    Comment: CRITICAL RESULT CALLED TO, READ BACK BY AND VERIFIED WITH: GREGORY GREENWOOD PHARMD 0800 01/28/22 HNM    Proteus species NOT DETECTED NOT DETECTED Final   Salmonella species NOT DETECTED  NOT DETECTED Final   Serratia marcescens NOT DETECTED NOT DETECTED Final   Haemophilus influenzae NOT DETECTED NOT DETECTED Final   Neisseria meningitidis NOT DETECTED NOT DETECTED Final   Pseudomonas aeruginosa NOT DETECTED NOT DETECTED Final   Stenotrophomonas maltophilia NOT DETECTED NOT DETECTED Final   Candida albicans NOT DETECTED NOT DETECTED Final   Candida auris NOT DETECTED NOT DETECTED Final   Candida glabrata NOT DETECTED NOT DETECTED Final   Candida krusei NOT DETECTED NOT DETECTED Final   Candida parapsilosis NOT DETECTED NOT DETECTED Final   Candida tropicalis NOT DETECTED NOT DETECTED Final   Cryptococcus neoformans/gattii NOT DETECTED NOT DETECTED Final   CTX-M ESBL NOT DETECTED NOT DETECTED Final   Carbapenem resistance IMP NOT DETECTED NOT DETECTED Final   Carbapenem resistance KPC NOT DETECTED NOT DETECTED Final   Carbapenem resistance NDM NOT DETECTED NOT DETECTED Final   Carbapenem resist OXA 48 LIKE NOT DETECTED NOT DETECTED Final   Carbapenem resistance VIM NOT DETECTED NOT DETECTED Final    Comment: Performed at [facility], [address], [place], [place] [address]  Urine Culture     Status: None   Collection Time: 01/27/22 11:03 AM   Specimen: Urine, Clean Catch  Result Value Ref Range Status   Specimen Description   Final    URINE, CLEAN CATCH Performed at [facility], [address], [place], [place] [address]    Special Requests   Final     NONE Performed at [facility], [address] [place], [place] [address]    Culture   Final    NO GROWTH Performed at [facility], [address] [place], [place] [address]    Report Status 01/28/2022 FINAL  Final  Blood culture (routine x 2)     Status: None (Preliminary result)   Collection Time: 01/27/22 11:30 AM   Specimen: BLOOD  Result Value Ref Range Status   Specimen Description BLOOD RIGHT ANTECUBITAL  Final   Special Requests   Final    BOTTLES DRAWN AEROBIC AND ANAEROBIC Blood Culture adequate volume   Culture   Final    NO GROWTH 4 DAYS Performed at [facility], [address], [place], [place] [address]    Report Status PENDING  Incomplete  Aerobic/Anaerobic Culture w Gram Stain (surgical/deep wound)     Status: None (Preliminary result)   Collection Time: 01/28/22 10:23 AM   Specimen: Gallbladder; Bile  Result Value Ref Range Status   Specimen Description   Final    GALL BLADDER Performed at [facility], [address] [place], [place] [address]    Special Requests   Final    NONE Performed at [facility], [address], [place], [place] [address]    Gram Stain   Final    FEW WBC PRESENT, PREDOMINANTLY PMN FEW GRAM NEGATIVE RODS Performed at [facility], [place] [address], [place], [place] [address]    Culture   Final    ABUNDANT KLEBSIELLA PNEUMONIAE NO ANAEROBES ISOLATED; CULTURE IN PROGRESS FOR 5 DAYS    Report Status PENDING  Incomplete   Organism ID, Bacteria KLEBSIELLA PNEUMONIAE  Final      Susceptibility   Klebsiella pneumoniae - MIC*    AMPICILLIN >=32 RESISTANT Resistant     CEFAZOLIN <=4 SENSITIVE Sensitive     CEFEPIME <=0.12 SENSITIVE Sensitive     CEFTAZIDIME <=1 SENSITIVE Sensitive     CEFTRIAXONE <=0.25 SENSITIVE Sensitive  CIPROFLOXACIN <=0.25 SENSITIVE Sensitive     GENTAMICIN <=1 SENSITIVE Sensitive     IMIPENEM <=0.25 SENSITIVE Sensitive     TRIMETH/SULFA <=20  SENSITIVE Sensitive     AMPICILLIN/SULBACTAM 8 SENSITIVE Sensitive     PIP/TAZO <=4 SENSITIVE Sensitive     * ABUNDANT KLEBSIELLA PNEUMONIAE    Medications: Scheduled Meds:  aspirin EC  81 mg Oral Daily   ezetimibe  10 mg Oral Daily   FLUoxetine  10 mg Oral Daily   insulin aspart  0-5 Units Subcutaneous QHS   insulin aspart  0-9 Units Subcutaneous TID WC   levothyroxine  125 mcg Oral Q0600   polyethylene glycol  17 g Oral Daily   rosuvastatin  20 mg Oral Daily   sodium chloride flush  5 mL Intracatheter Q8H   Continuous Infusions:  sodium chloride 100 mL/hr at 01/31/22 1124   piperacillin-tazobactam (ZOSYN)  IV 3.375 g (01/31/22 0515)   PRN Meds:.acetaminophen, bisacodyl, HYDROmorphone (DILAUDID) injection, levalbuterol, nitroGLYCERIN, ondansetron (ZOFRAN) IV, ondansetron, [COMPLETED] senna-docusate **FOLLOWED BY** senna-docusate  No Known Allergies  Urinalysis: No results for input(s): "COLORURINE", "LABSPEC", "PHURINE", "GLUCOSEU", "HGBUR", "BILIRUBINUR", "KETONESUR", "PROTEINUR", "UROBILINOGEN", "NITRITE", "LEUKOCYTESUR" in the last 72 hours.  Invalid input(s): "APPERANCEUR"    Imaging: NM Myocar Multi W/Spect W/Wall Motion / EF  Result Date: 01/30/2022   The study is normal. Findings are consistent with no ischemia and no prior myocardial infarction. The study is low risk.   No ST deviation was noted.   LV perfusion is normal. There is no evidence of ischemia. There is no evidence of infarction.   Left ventricular function is normal. Nuclear stress EF: 60 %. The left ventricular ejection fraction is normal (55-65%). End diastolic cavity size is normal. End systolic cavity size is normal.   CT ABDOMEN W CONTRAST  Result Date: 01/30/2022 CLINICAL DATA:  Acute abdominal pain. Cholecystitis. Status post percutaneous cholecystostomy. EXAM: CT ABDOMEN WITH CONTRAST TECHNIQUE: Multidetector CT imaging of the abdomen was performed using the standard protocol following bolus  administration of intravenous contrast. RADIATION DOSE REDUCTION: This exam was performed according to the departmental dose-optimization program which includes automated exposure control, adjustment of the mA and/or kV according to patient size and/or use of iterative reconstruction technique. CONTRAST:  142m OMNIPAQUE IOHEXOL 300 MG/ML  SOLN COMPARISON:  01/27/2022 FINDINGS: Lower chest: Mild dependent atelectasis in both lung bases. Hepatobiliary: No hepatic masses or abscess identified. Thrombosis of the left portal vein is again seen. Large gallstone and diffuse gallbladder wall thickening unchanged in appearance, consistent with acute cholecystitis. Mild biliary ductal dilatation remains stable. A cholecystostomy tube is seen in abnormal position, with the pigtail loop located in the left perihepatic space. Mild perihepatic ascites is noted. Pancreas:  No mass or inflammatory changes. Spleen:  Within normal limits in size and appearance. Adrenals/Urinary Tract: No suspicious masses identified. No evidence of hydronephrosis. Stomach/Bowel: Increased gaseous distention of nondependent transverse colon, consistent with colonic ileus. Vascular/Lymphatic: No pathologically enlarged lymph nodes identified. No acute vascular findings. Aortic atherosclerotic calcification incidentally noted. Other: Mild bilateral upper quadrant ascites. Mild diffuse body wall edema. Musculoskeletal:  No suspicious bone lesions identified. IMPRESSION: Cholecystostomy tube in abnormal position, with the pigtail loop located in the left perihepatic space. Stable findings consistent with acute cholecystitis. Stable mild biliary ductal dilatation. Stable left portal vein thrombosis. New mild ascites and diffuse body wall edema. Mild colonic ileus. Aortic Atherosclerosis (ICD10-I70.0). Electronically Signed   By: JMarlaine HindM.D.   On: 01/30/2022 13:06  Assessment/Plan:  [name] is a 76 y.o. female with medical  problems of  DM-2, CAD, non-STEMI, hypertension, hyperlipidemia, hypothyroidism, chronic diastolic CHF, thrombocytopenia, major depressive disorder in remission, obstructive sleep apnea, portal vein thrombosis, obesity, acute left posterior tibial vein DVT,  was admitted on 01/27/2022 for :  Cholecystitis [K81.9] NSTEMI (non-ST elevated myocardial infarction) ([place]) [I21.4] Fever, unspecified fever cause [R50.9] acute cholecystitis with gallstones, Klebsiella sepsis  #Acute kidney injury, acute metabolic acidosis nongap, Sepsis Likely ATN due to multifactorial etiology from hypotension, Klebsiella sepsis, IV contrast exposure, IV Toradol (nonsteroidals) Urine output is low.  100 cc last 24 hours. Serum mean trends are worsening.  Baseline creatinine of 0.9, GFR greater than 60 from 12/11/2020.  Plan: Continue treatment of sepsis as you are doing. Dose med for CrCl < 30 cc/min Gallbladder drain has been replaced Continue to maintain hemodynamic stability and avoid hypotension.  Use pressors if necessary. Since urine output is low, reduce the rate of IV fluids to 100 cc/h to avoid volume overload. Can consider IV bicarbonate supplementation tomorrow if sodium bicarbonate still remains low. Avoid IV contrast Avoid nephrotoxic agents including nonsteroidals. Acute indication for dialysis at present but we discussed with patient that  it may be necessary this admission if her renal function does not turn around. Case discussed with primary team, patient and her son who is at bedside.    Shareese Macha Candiss Norse 01/31/22

## 2022-01-31 NOTE — Consult Note (Incomplete)
NAME:  [name], MRN:  [id number], DOB:  11/01/1945, LOS: 4 ADMISSION DATE:  01/27/2022, CONSULTATION DATE:  12/3 REFERRING MD:  Neomia Glass  REASON FOR CONSULT:  Hypotension   Brief HPI  76 y.o  female with significant PMH of CAD s/p STENT, HLD, DM, Hypothyroidism, OSA, Endometrial cancer, Diastolic CHF, CKD stage III, who presented to the ED with chief complaints of fever, nausea and vomiting found to have NSTEMI ,dilated CBD and acute cholecystitis with large gallstone in the neck of the gallbladder.    ED Course: Initial vital signs showed HR of 72 beats/minute, BP 80/48 mm Hg, the RR 30 breaths/minute, and the oxygen saturation 94% on RA and a temperature of 98.93F (36.9C).  Pertinent Labs/Diagnostics Findings: Chemistry:Na+/ K+: 132/3.2  Glucose:133  BUN/Cr.:45/1.32 CBC: WBC: 6.4 Hgb/Hct: 9.6/30.14 Plts:118 Other Lab findings:   PCT: 12.71  Lactic acid:1.0  COVID PCR: Negative, Troponin: 865 BNP: 38.4  Imaging:  CT findings as below noted for  portal vein thrombosis, dilated CBD and acute Left DVT.  Hospital Course: Patient admitted to stepdown unit under hospitalist service. Due to findings as above General surgery, GI and Cardiology was consulted. Patient was evaluated by cardiology who with recs to start heparin gtt x 48 hrs and General surgery with recs to obtain MRI and perc chole. Patient underwent image-guided percutaneous cholecystostomy tube placement. Patient developed worsening pain with increased WBC, and hypotension. Repeat CT showed abnormal position of the GB drain. IR consulted and drain replaced. Patient transferred to ICU for pressors. PCCM consulted.   Past Medical History    Coronary artery disease    a. 07/2013 [place] ([place]): LM nl, LAD 100p (thrombectomy & 3.5x23 Alpine DES), LCX nl, RCA nl, EF 45%.   Depression   Diastolic dysfunction    a.) TTE 11/26/2020 - LVEF 50-55%; G2DD.   Endometrial cancer ([place])   Gravida 3 para 3   Hyperlipidemia    Hypertension   Hypertensive heart disease   Hypothyroid   Ischemic cardiomyopathy    a.) 07/2013 EF 45% @ time of STEMI; b.) 11/2013 Echo: EF 55-60%, mildly dil LA, nl RV fxn. c.) TTE 07/29/2015 - LVEF 55-60%, mild LA and RV dilation. d.) TTE 11/26/2020 - LVEF 50-55%; G2DD.   Menopause   Obstructive sleep apnea on CPAP   Osteoarthritis   PONV (postoperative nausea and vomiting)   ST elevation myocardial infarction (STEMI) of anterolateral wall (HCC)    a.) LVEF reduced at 45%; Tx'd with trombectomy and PCI to 100% LAD; 3.5 x 23 mm Xience Alpine DES placed.   T2DM (type 2 diabetes mellitus) ([place])   Thyroid cancer ([place])    a.) s/p total thyroidectomy. b.) on daily levothyroxine.   Significant Hospital Events   11/29: Admit to stepdown unit with Klebsiella sepsis secondary to acute cholecystitis/gallstones  11/30: Patient underwent image-guided percutaneous cholecystostomy tube placement.  Started on heparin drip for NSTEMI 12/2: Patient with worsening pain and increased white count.  Repeat CT abdomen showed abnormal positioning GB drain 12/3: S/p replacement of new 10 FR percutaneous cholecystectomy tube by IR.  Remained hypotensive transferred to ICU for pressors PCCM consulted  Consults:  Gastroenterology Cardiology IR General surgery PCCM  Procedures:  11/30: Percutaneous cholecystostomy drain placement 12/3:  Replacement of percutaneous cholecystostomy drain   Significant Diagnostic Tests:   12/29 US Abdomen RUQ>IMPRESSION: 1. 2.6 cm gallstone at the gallbladder neck with gallbladder wall thickening. No Murphy sign. Findings worrisome for acute cholecystitis, despite the absence of  a demonstrable Murphy sign. 2. Dilated common bile duct to 12 mm. No visible ductal stone.  12/29:MR Abdomen MRCP 1. Acute left portal vein thrombosis. No definite underlying mass is identified, although sensitivity is adversely affected by motion artifact. 2. Cholelithiasis with gallbladder  wall thickening and accentuated arterial phase hepatic parenchymal enhancement adjacent to the gallbladder which can be associated with acute cholecystitis. 3. Common bile duct 1.0 cm in diameter, without a definite CBD stone identified. 4. Atrophic pancreas. 5. Lumbar spondylosis and degenerative disc disease.  12/29: CTA abdomen and pelvis>  1. Non-enhancement of the left portal vein suspicious for portal vein thrombosis. Main portal vein and right portal veins enhance homogeneously. 2. Cholelithiasis with a 4.4 cm stone in the region of the gallbladder neck. Gallbladder is moderately distended with diffuse gallbladder wall thickening and mild intra and extrahepatic biliary ductal dilatation. Findings are concerning for acute cholecystitis. 3. Colonic diverticulosis without evidence of acute diverticulitis. 4. Aortic atherosclerosis (ICD10-I70.0).  12/29: CTA Chest> 1. No evidence of pulmonary embolism. 2. Trace layering bilateral pleural effusions with mild bibasilar subsegmental atelectasis. 3. Mildly dilated pulmonary trunk suggesting pulmonary arterial hypertension. 4. Aortic and coronary artery atherosclerosis.  11/29 :US DVT: Acute occlusive DVT in the left posterior tibial vein at the mid Calf. No DVT on the right.  Micro Data:  11/29 BCx: 1 of 4 bottles, Klebsiella pneumoniae (susceptibilities pending) 11/29 UCx: no growth final  11/30 Bile Cx: abundant GNR  Antimicrobials:  11/29 Cefepime, Metronidazole, Vancomycin x1 11/30 Zosyn >  OBJECTIVE  Blood pressure (!) 96/37, pulse 72, temperature 98.1 F (36.7 C), temperature source Oral, resp. rate 14, height '5\' 6"'$  (1.676 m), weight 107.4 kg, SpO2 100 %.        Intake/Output Summary (Last 24 hours) at 01/31/2022 2350 Last data filed at 01/31/2022 2155 Gross per 24 hour  Intake 4523.46 ml  Output 230 ml  Net 4293.46 ml   Filed Weights   01/29/22 0422 01/31/22 0500 01/31/22 1205  Weight: 103.3 kg 109.6 kg 107.4  kg   Physical Examination  GENERAL:  76 y.o.-year-old patient lying in the bed with  mild acute distress. Obese LUNGS: Normal breath sounds bilaterally, no wheezing CARDIOVASCULAR: S1, S2 normal. No murmur   ABDOMEN: Soft, tender++, nondistended.right sided GB drain+ EXTREMITIES: +  edema b/l.    NEUROLOGIC: nonfocal  patient is alert and awake SKIN: No obvious rash, lesion, or ulcer.   Labs/imaging that I havepersonally reviewed  (right click and "Reselect all SmartList Selections" daily)     Labs   CBC: Recent Labs  Lab 01/27/22 0942 01/28/22 0322 01/29/22 0822 01/30/22 0716 01/31/22 0508  WBC 6.4 16.4* 25.5* 44.6* 47.4*  NEUTROABS 5.4  --   --   --   --   HGB 9.6* 9.3* 10.7* 9.7* 8.6*  HCT 30.1* 28.8* 33.7* 30.8* 27.7*  MCV 93.5 91.1 92.8 93.3 95.2  PLT 118* 112* 141* 186 161    Basic Metabolic Panel: Recent Labs  Lab 01/27/22 0942 01/28/22 0322 01/29/22 0805 01/30/22 1330 01/31/22 0508  NA 132* 133* 129* 128* 131*  K 3.6 3.7 4.3 3.9 4.1  CL 102 104 101 101 106  CO2 23 20* 15* 15* 12*  GLUCOSE 133* 113* 213* 153* 125*  BUN 45* 35* 36* 42* 42*  CREATININE 1.32* 1.21* 1.46* 2.29* 2.54*  CALCIUM 7.5* 7.5* 7.7* 7.3* 7.1*   GFR: Estimated Creatinine Clearance: 23.4 mL/min (A) (by C-G formula based on SCr of 2.54 mg/dL (H)). Recent Labs  Lab 01/27/22 0942 01/27/22 1048 01/27/22 1308 01/28/22 0322 01/29/22 0822 01/30/22 0716 01/31/22 0508  PROCALCITON 12.71  --   --  24.58 9.14  --   --   WBC 6.4  --   --  16.4* 25.5* 44.6* 47.4*  LATICACIDVEN  --  1.0 0.9  --   --   --   --     Liver Function Tests: Recent Labs  Lab 01/27/22 0942 01/31/22 0508  AST 35 19  ALT 20 11  ALKPHOS 115 96  BILITOT 0.6 1.7*  PROT 6.0* 5.1*  ALBUMIN 2.3* 1.7*   No results for input(s): "LIPASE", "AMYLASE" in the last 168 hours. No results for input(s): "AMMONIA" in the last 168 hours.  ABG No results found for: "PHART", "PCO2ART", "PO2ART", "HCO3", "TCO2",  "ACIDBASEDEF", "O2SAT"   Coagulation Profile: Recent Labs  Lab 01/27/22 1446  INR 1.1    Cardiac Enzymes: No results for input(s): "CKTOTAL", "CKMB", "CKMBINDEX", "TROPONINI" in the last 168 hours.  HbA1C: Hemoglobin A1C  Date/Time Value Ref Range Status  08/10/2018 12:00 AM 7.7  Final   Hgb A1c MFr Bld  Date/Time Value Ref Range Status  01/27/2022 02:50 PM 6.8 (H) 4.8 - 5.6 % Final    Comment:    (NOTE)         Prediabetes: 5.7 - 6.4         Diabetes: >6.4         Glycemic control for adults with diabetes: <7.0   10/01/2021 09:35 AM 6.6 (H) <5.7 % of total Hgb Final    Comment:    For someone without known diabetes, a hemoglobin A1c value of 6.5% or greater indicates that they may have  diabetes and this should be confirmed with a follow-up  test. . For someone with known diabetes, a value <7% indicates  that their diabetes is well controlled and a value  greater than or equal to 7% indicates suboptimal  control. A1c targets should be individualized based on  duration of diabetes, age, comorbid conditions, and  other considerations. . Currently, no consensus exists regarding use of hemoglobin A1c for diagnosis of diabetes for children. .     CBG: Recent Labs  Lab 01/30/22 1651 01/30/22 2044 01/31/22 0759 01/31/22 1205 01/31/22 2121  GLUCAP 157* 149* 126* 109* 139*    Review of Systems:     Past Medical History  She,  has a past medical history of Coronary artery disease, Depression, Diastolic dysfunction, Endometrial cancer ([place]), Gravida 3 para 3, Hyperlipidemia, Hypertension, Hypertensive heart disease, Hypothyroid, Ischemic cardiomyopathy, Menopause, Obstructive sleep apnea on CPAP, Osteoarthritis, PONV (postoperative nausea and vomiting), ST elevation myocardial infarction (STEMI) of anterolateral wall ([place]) (08/14/2013), T2DM (type 2 diabetes mellitus) ([place]), and Thyroid cancer ([place]).   Surgical History    Past Surgical History:  Procedure  Laterality Date   CATARACT EXTRACTION W/PHACO Left 08/30/2016   Procedure: CATARACT EXTRACTION PHACO AND INTRAOCULAR LENS PLACEMENT ([place]) Left daibetic;  Surgeon: Leandrew Koyanagi, MD;  Location: [place];  Service: Ophthalmology;  Laterality: Left;  Diabetic - insulin and oral meds sleep apnea   CATARACT EXTRACTION W/PHACO Right 10/07/2016   Procedure: CATARACT EXTRACTION PHACO AND INTRAOCULAR LENS PLACEMENT ([facility]);  Surgeon: Leandrew Koyanagi, MD;  Location: [facility];  Service: Ophthalmology;  Laterality: Right;  Lot# [id number] H [place]: 00:43.3 AP%: 12.2 CDE: 5.28   CORONARY ANGIOPLASTY WITH STENT PLACEMENT Left 08/14/2013   Procedure: CORONARY ANGIOPLASTY WITH STENT PLACEMENT (3.5 x 23 mm Xience Alpine DES to LAD);  Location: [facility] [place]; Surgeon: Algernon Huxley, MD   [facility] CYST EXCISION Right 1980   IR PERC CHOLECYSTOSTOMY  01/28/2022   IR PERC CHOLECYSTOSTOMY  01/31/2022   LAPAROSCOPIC HYSTERECTOMY     THYROIDECTOMY  1995   total   TOTAL KNEE ARTHROPLASTY Right 12/18/2020   Procedure: RIGHT TOTAL KNEE ARTHROPLASTY;  Surgeon: Thornton Park, MD;  Location: [facility];  Service: Orthopedics;  Laterality: Right;   TOTAL KNEE ARTHROPLASTY Left 12/10/2021   Procedure: TOTAL KNEE ARTHROPLASTY;  Surgeon: Thornton Park, MD;  Location: [facility];  Service: Orthopedics;  Laterality: Left;     Social History   reports that she has never smoked. She has never used smokeless tobacco. She reports that she does not drink alcohol and does not use drugs.   Family History   Her family history includes Heart attack in her father; Heart disease (age of onset: 82) in her father; Heart failure in her father; Hyperlipidemia in her father; Hypertension in her father; Pulmonary fibrosis in her son. There is no history of Breast cancer.   Allergies No Known Allergies   Home Medications  Prior to Admission medications   Medication Sig Start Date End Date Taking?  Authorizing Provider  acetaminophen (TYLENOL) 650 MG CR tablet Take 1,950 mg by mouth daily.   Yes [provider]  amLODipine (NORVASC) 2.5 MG tablet TAKE 1 TABLET BY MOUTH DAILY 06/16/21  Yes Gollan, Kathlene November, MD  aspirin EC (ASPIRIN 81) 81 MG tablet Take 81 mg by mouth daily.   Yes [provider]  bisacodyl (DULCOLAX) 5 MG EC tablet Take 2 tablets (10 mg total) by mouth daily as needed for moderate constipation. 12/11/21  Yes Thornton Park, MD  carvedilol (COREG) 3.125 MG tablet Take 3.125 mg by mouth daily.   Yes [provider]  docusate sodium (COLACE) 100 MG capsule Take 1 capsule (100 mg total) by mouth 2 (two) times daily. 12/11/21  Yes Thornton Park, MD  ezetimibe (ZETIA) 10 MG tablet TAKE 1 TABLET BY MOUTH DAILY 07/02/21  Yes Gollan, Kathlene November, MD  FLUoxetine (PROZAC) 10 MG capsule TAKE 1 CAPSULE BY MOUTH DAILY 12/07/21  Yes Karamalegos, Devonne Doughty, DO  fluticasone (FLONASE) 50 MCG/ACT nasal spray Place 2 sprays into both nostrils daily. Patient taking differently: Place 2-3 sprays into both nostrils at bedtime as needed. 02/18/21  Yes Brunetta Jeans, PA-C  levothyroxine (SYNTHROID) 125 MCG tablet TAKE 1 TABLET BY MOUTH  DAILY BEFORE BREAKFAST 09/29/21  Yes Karamalegos, Devonne Doughty, DO  losartan (COZAAR) 100 MG tablet TAKE 1 TABLET BY MOUTH DAILY 08/17/21  Yes Gollan, Kathlene November, MD  metFORMIN (GLUCOPHAGE-XR) 500 MG 24 hr tablet TAKE 3 TABLETS BY MOUTH DAILY  WITH BREAKFAST 01/08/22  Yes Karamalegos, Alexander J, DO  ondansetron (ZOFRAN-ODT) 4 MG disintegrating tablet Take 4 mg by mouth every 8 (eight) hours as needed. 01/23/22  Yes [provider]  OZEMPIC, 1 MG/DOSE, 4 MG/3ML SOPN INJECT SUBCUTANEOUSLY 1 MG  WEEKLY 01/18/22  Yes Karamalegos, Alexander J, DO  rosuvastatin (CRESTOR) 20 MG tablet TAKE 1 TABLET BY MOUTH DAILY 07/28/21  Yes Gollan, Kathlene November, MD  traMADol (ULTRAM) 50 MG tablet Take 50 mg by mouth every 6 (six) hours as needed. 12/23/21   Yes [provider]  Sleepy Eye test strip U UTD BID Patient not taking: Reported on 01/26/2022 11/10/16   Olin Hauser, DO  carvedilol (COREG) 3.125 MG tablet TAKE 1 TABLET BY MOUTH TWICE  DAILY Patient  not taking: Reported on 01/27/2022 07/28/21   Minna Merritts, MD  fexofenadine (ALLEGRA) 180 MG tablet Take 180 mg by mouth daily as needed for allergies or rhinitis. Patient not taking: Reported on 11/30/2021    [provider]  nitroGLYCERIN (NITROSTAT) 0.4 MG SL tablet Place 1 tablet (0.4 mg total) under the tongue every 5 (five) minutes as needed for chest pain. 01/29/20   Minna Merritts, MD  oxyCODONE (OXY IR/ROXICODONE) 5 MG immediate release tablet Take 1-2 tablets (5-10 mg total) by mouth every 4 (four) hours as needed for moderate pain (pain score 4-6). Patient not taking: Reported on 01/26/2022 12/11/21   Thornton Park, MD    Scheduled Meds:  aspirin EC  81 mg Oral Daily   Chlorhexidine Gluconate Cloth  6 each Topical Q0600   ezetimibe  10 mg Oral Daily   FLUoxetine  10 mg Oral Daily   insulin aspart  0-5 Units Subcutaneous QHS   insulin aspart  0-9 Units Subcutaneous TID WC   levothyroxine  125 mcg Oral Q0600   polyethylene glycol  17 g Oral Daily   rosuvastatin  20 mg Oral Daily   sodium chloride flush  5 mL Intracatheter Q8H   Continuous Infusions:  sodium chloride 100 mL/hr at 02/01/22 0300   sodium chloride Stopped (01/31/22 2120)   heparin 1,550 Units/hr (02/01/22 0300)   norepinephrine (LEVOPHED) Adult infusion 22 mcg/min (02/01/22 0300)   piperacillin-tazobactam (ZOSYN)  IV Stopped (02/01/22 0110)   PRN Meds:.acetaminophen, ALPRAZolam, bisacodyl, HYDROmorphone (DILAUDID) injection, levalbuterol, nitroGLYCERIN, ondansetron (ZOFRAN) IV, ondansetron, [COMPLETED] senna-docusate **FOLLOWED BY** senna-docusate  Active Hospital Problem list     Assessment & Plan:   Severe Sepsis due to Klebsiella Bacteremia Acute  Cholecystitis/Gallstone S/p Percutaneous cholecystostomy drain placement  on 11/30 revision on 12/3 -Supplemental oxygen as needed, to maintain SpO2 > 90% -F/u cultures, trend lactic/ PCT -Monitor WBC/ fever curve -IVF hydration as needed:  -Pressors for MAP goal >65 -Strict I/O's -Plan for outpatient cholecystectomy  NSTEMI Chronic Diastolic CHF PMHx: CAD s/p LAD PCI, NSTEMI, HLD, HTN, OSA on CPAP -Continuous cardiac monitoring -Trend HS Troponin until peaked  -Diuresis as BP and renal function permits ~ currently Euvolemic with BNP 83, trace edema with no s/s overload -Continue Aspirin and Heparin gtt -Continue Rosuvastatin and Zetia -Cardiology following, appreciate input -CPAP at bedtime   AKI on CKD, likely ATN in the setting of above, worsened post IV contrast  -Monitor I&O's / urinary output -Follow BMP -Ensure adequate renal perfusion -Avoid nephrotoxic agents as able -Replace electrolytes as indicated -Pharmacy following for assistance with electrolyte replacement -Nephrology consulted, appreciate input  Acute Left post tibial vein DVT (deep venous thrombosis)  Portal Vein Thrombosis CTA negative for PE -Continue IV heparin -Plan ot switch to po DOAC pending cardiac w/u   Diabetes mellitus HgbA1c 6.8 -CBG's AC & hs; Target range of 140 to 180 -SSI -Follow ICU Hypo/Hyperglycemia protocol  Hypothyroidism -Continue Synthroid   Best practice:  Diet:  Oral Pain/Anxiety/Delirium protocol (if indicated): No VAP protocol (if indicated): Not indicated DVT prophylaxis: Systemic AC GI prophylaxis: PPI Glucose control:  SSI Yes Central venous access:  Yes, and it is still needed Arterial line:  N/A Foley:  Yes, and it is still needed Mobility:  bed rest  PT consulted: N/A Last date of multidisciplinary goals of care discussion [12/3] Code Status:  full code Disposition: ICU   = Goals of Care = Code Status Order: FULL  Primary Emergency Contact:  Krans,Aimee Wishes to  pursue full aggressive treatment and intervention options, including CPR and intubation, but goals of care will be addressed on going with family if that should become necessary.   Critical care time: 45 minutes       [name], DNP, CCRN, FNP-C, AGACNP-BC Acute Care Nurse Practitioner [facility]  PCCM on call pager [phone] until 7 am

## 2022-01-31 NOTE — Progress Notes (Signed)
An auto-generated consult was placed to the hospital's IV Nurse to place an ultrasound guided iv for vasopressors;  pt noted to have 3 peripheral ivs at present; Have sent secure chats to Jason Nest, RN re starting a 4th iv, and also explaining that ivs for vasopressors are placed in the forearms, if the veins are less than 72m deep;  two of the ivs the pt currently has are already in the forearms;  according to ALevada Dy "one of the ivs has a blood return;"  suggested starting Levo to that iv site, and also placing the watch over that site, and checking for a blood return regularly; Will assess pt's iv access, but may need to remove an iv in order to place a new one.

## 2022-01-31 NOTE — Progress Notes (Signed)
ANTICOAGULATION CONSULT NOTE  Pharmacy Consult for Heparin Infusion Indication: DVT  No Known Allergies  Patient Measurements: Height: '5\' 6"'$  (167.6 cm) Weight: 107.4 kg (236 lb 12.4 oz) IBW/kg (Calculated) : 59.3 Heparin Dosing Weight: 82.9 kg  Vital Signs: Temp: 97.8 F (36.6 C) (12/03 1205) Temp Source: Axillary (12/03 1205) BP: 100/47 (12/03 1430) Pulse Rate: 79 (12/03 1430)  Labs: Recent Labs     0000 01/29/22 0805 01/29/22 0822 01/29/22 1102 01/29/22 2111 01/30/22 0716 01/30/22 1330 01/31/22 0508  HGB   < >  --  10.7*  --   --  9.7*  --  8.6*  HCT  --   --  33.7*  --   --  30.8*  --  27.7*  PLT  --   --  141*  --   --  186  --  205  HEPARINUNFRC  --   --   --    < > 0.45 0.39  --  <0.10*  CREATININE  --  1.46*  --   --   --   --  2.29* 2.54*   < > = values in this interval not displayed.     Estimated Creatinine Clearance: 23.4 mL/min (A) (by C-G formula based on SCr of 2.54 mg/dL (H)).   Medical History: Past Medical History:  Diagnosis Date   Coronary artery disease    a. 07/2013 [place]I/PCI ([place] LM nl, LAD 100p (thrombectomy & 3.5x23 Alpine DES), LCX nl, RCA nl, EF 45%.   Depression    Diastolic dysfunction    a.) TTE 11/26/2020 - LVEF 50-55%; G2DD.   Endometrial cancer ([place])    Gravida 3 para 3    Hyperlipidemia    Hypertension    Hypertensive heart disease    Hypothyroid    Ischemic cardiomyopathy    a.) 07/2013 EF 45% @ time of STEMI; b.) 11/2013 Echo: EF 55-60%, mildly dil LA, nl RV fxn. c.) TTE 07/29/2015 - LVEF 55-60%, mild LA and RV dilation. d.) TTE 11/26/2020 - LVEF 50-55%; G2DD.   Menopause    Obstructive sleep apnea on CPAP    Osteoarthritis    PONV (postoperative nausea and vomiting)    ST elevation myocardial infarction (STEMI) of anterolateral wall ([place]) 08/14/2013   a.) LVEF reduced at 45%; Tx'd with trombectomy and PCI to 100% LAD; 3.5 x 23 mm Xience Alpine DES placed.   T2DM (type 2 diabetes mellitus) ([place])    Thyroid cancer  ([place])    a.) s/p total thyroidectomy. b.) on daily levothyroxine.    Medications:  Scheduled:   aspirin EC  81 mg Oral Daily   [START ON 02/01/2022] Chlorhexidine Gluconate Cloth  6 each Topical Q0600   ezetimibe  10 mg Oral Daily   FLUoxetine  10 mg Oral Daily   insulin aspart  0-5 Units Subcutaneous QHS   insulin aspart  0-9 Units Subcutaneous TID WC   levothyroxine  125 mcg Oral Q0600   polyethylene glycol  17 g Oral Daily   rosuvastatin  20 mg Oral Daily   sodium chloride flush  5 mL Intracatheter Q8H   Infusions:   sodium chloride 100 mL/hr at 01/31/22 1521   sodium chloride     norepinephrine (LEVOPHED) Adult infusion 4 mcg/min (01/31/22 1521)   piperacillin-tazobactam (ZOSYN)  IV 12.5 mL/hr at 01/31/22 1521   PRN: acetaminophen, bisacodyl, HYDROmorphone (DILAUDID) injection, levalbuterol, nitroGLYCERIN, ondansetron (ZOFRAN) IV, ondansetron, [COMPLETED] senna-docusate **FOLLOWED BY** senna-docusate  Assessment: MADESYN AST is a 76 y.o. female  presenting with elevated troponins and D-Dimer. PMH significant for CAD (STEMI s/p stent 2015), HLD, HTN, DM, hypothyroidism (s/p thyroidectomy), depression, OSA on CPAP, endometrial cancer, dCHF, knee replacements (R-11/2020, L-11/2021), CKD-3 . Patient was not on [facility] PTA per chart review. [place] found acute occlusive DVT in the left posterior tibial vein at the mid calf. Pharmacy has been consulted to initiate and manage heparin infusion.   Labs for monitoring heparin infusion have been delayed on multiple occasions due to inability to draw sample.   Baseline Labs: aPTT 42, PT 14.2, INR 1.1, Hgb 9.6, Hct 30.1, Plt 118   Goal of Therapy:  Heparin level 0.3-0.7 units/ml Monitor platelets by anticoagulation protocol: Yes    Date Time HL Rate/Comment  11/29 2242 0.25 1050/SUBtherapeutic 11/30 0830 --- 0/paused for procedure 11/30 1141 --- 1200/resumed post procedure 11/30   2030    0.28     1200/SUBtherapeutic   12/1 1102 0.28 1350/SUBtherapeutic 12/01 2111 0.45 Therapeutic x 1 12/2 0716 0.39 Therapeutic x2 12/3  Heparin drip stopped at 0757 for procedure- now resuming ~1545   Plan:  Will resume Heparin drip at 1550 units/hr check HL in 8 hrs *completed NSTEMI tx, now continuing for DVT treatment Continue to monitor H&H and platelets daily while on heparin infusion   Chinita Greenland PharmD Clinical Pharmacist 01/31/2022

## 2022-01-31 NOTE — Progress Notes (Signed)
[facility] NAME: [name]    MR#:  [id number]  DATE OF BIRTH:  [birth year]  SUBJECTIVE:  No family at bedside during my evaluation. Patient continues to have  significant pain at the in the upper abdomen  IV heparin BP soft this am Will give small dose of dilaudid   VITALS:  Blood pressure (!) 111/45, pulse 84, temperature 98 F (36.7 C), resp. rate 20, height '5\' 6"'$  (1.676 m), weight 109.6 kg, SpO2 96 %.  PHYSICAL EXAMINATION:   GENERAL:  76 y.o.-year-old patient lying in the bed with  mild acute distress. Obese LUNGS: Normal breath sounds bilaterally, no wheezing CARDIOVASCULAR: S1, S2 normal. No murmur   ABDOMEN: Soft, tender++, nondistended.right sided GB drain+ EXTREMITIES: +  edema b/l.    NEUROLOGIC: nonfocal  patient is alert and awake SKIN: No obvious rash, lesion, or ulcer.   LABORATORY PANEL:  CBC Recent Labs  Lab 01/31/22 0508  WBC 47.4*  HGB 8.6*  HCT 27.7*  PLT 205     Chemistries  Recent Labs  Lab 01/31/22 0508  NA 131*  K 4.1  CL 106  CO2 12*  GLUCOSE 125*  BUN 42*  CREATININE 2.54*  CALCIUM 7.1*  AST 19  ALT 11  ALKPHOS 96  BILITOT 1.7*    Cardiac Enzymes No results for input(s): "TROPONINI" in the last 168 hours. RADIOLOGY:  NM Myocar Multi W/Spect W/Wall Motion / EF  Result Date: 01/30/2022   The study is normal. Findings are consistent with no ischemia and no prior myocardial infarction. The study is low risk.   No ST deviation was noted.   LV perfusion is normal. There is no evidence of ischemia. There is no evidence of infarction.   Left ventricular function is normal. Nuclear stress EF: 60 %. The left ventricular ejection fraction is normal (55-65%). End diastolic cavity size is normal. End systolic cavity size is normal.   CT ABDOMEN W CONTRAST  Result Date: 01/30/2022 CLINICAL DATA:  Acute abdominal pain. Cholecystitis. Status post percutaneous cholecystostomy. EXAM: CT  ABDOMEN WITH CONTRAST TECHNIQUE: Multidetector CT imaging of the abdomen was performed using the standard protocol following bolus administration of intravenous contrast. RADIATION DOSE REDUCTION: This exam was performed according to the departmental dose-optimization program which includes automated exposure control, adjustment of the mA and/or kV according to patient size and/or use of iterative reconstruction technique. CONTRAST:  167m OMNIPAQUE IOHEXOL 300 MG/ML  SOLN COMPARISON:  01/27/2022 FINDINGS: Lower chest: Mild dependent atelectasis in both lung bases. Hepatobiliary: No hepatic masses or abscess identified. Thrombosis of the left portal vein is again seen. Large gallstone and diffuse gallbladder wall thickening unchanged in appearance, consistent with acute cholecystitis. Mild biliary ductal dilatation remains stable. A cholecystostomy tube is seen in abnormal position, with the pigtail loop located in the left perihepatic space. Mild perihepatic ascites is noted. Pancreas:  No mass or inflammatory changes. Spleen:  Within normal limits in size and appearance. Adrenals/Urinary Tract: No suspicious masses identified. No evidence of hydronephrosis. Stomach/Bowel: Increased gaseous distention of nondependent transverse colon, consistent with colonic ileus. Vascular/Lymphatic: No pathologically enlarged lymph nodes identified. No acute vascular findings. Aortic atherosclerotic calcification incidentally noted. Other: Mild bilateral upper quadrant ascites. Mild diffuse body wall edema. Musculoskeletal:  No suspicious bone lesions identified. IMPRESSION: Cholecystostomy tube in abnormal position, with the pigtail loop located in the left perihepatic space. Stable findings consistent with acute cholecystitis. Stable mild biliary ductal dilatation. Stable left portal  vein thrombosis. New mild ascites and diffuse body wall edema. Mild colonic ileus. Aortic Atherosclerosis (ICD10-I70.0). Electronically Signed    By: [name] M.D.   On: 01/30/2022 13:06    Assessment and Plan   [name] is a 76 y.o. female with medical history significant of CAD with stent placement, HLD, DM, hypothyroidism, depression, OSA, endometrial cancer, diastolic CHF, recent knee replacement, CKD-3, who presents with fever, nausea, vomiting.  Patient states that she has fever and chills for 4.5 days.  She had temperature 100.8 at home today.    CTA Chest: 1. No evidence of pulmonary embolism. 2. Trace layering bilateral pleural effusions with mild bibasilar subsegmental atelectasis. 3. Mildly dilated pulmonary trunk suggesting pulmonary arterial hypertension. 4. Aortic and coronary artery atherosclerosis.   CT Abdomen/Pelvis:  1. Non-enhancement of the left portal vein suspicious for portal vein thrombosis. Main portal vein and right portal veins enhance homogeneously. 2. Cholelithiasis with a 4.4 cm stone in the region of the gallbladder neck. Gallbladder is moderately distended with diffuse gallbladder wall thickening and mild intra and extrahepatic biliary ductal dilatation. Findings are concerning for acute cholecystitis. 3. Colonic diverticulosis without evidence of acute diverticulitis. 4. Aortic atherosclerosis (ICD10-I70.0).   US-RUQ 1. 2.6 cm gallstone at the gallbladder neck with gallbladder wall thickening. No Murphy sign. Findings worrisome for acute cholecystitis, despite the absence of a demonstrable Murphy sign. 2. Dilated common bile duct to 12 mm. No visible ductal stone.   LE venous doppler: Acute occlusive DVT in the left posterior tibial vein at the mid calf.  No DVT on the right.       Radiological Exams on Admission:  Imaging Results (Last 48 hours)  US Venous Img Lower Bilateral (DVT)   Result Date: 01/27/2022 CLINICAL DATA:  Knee replacements bilaterally in October with positive D-dimer EXAM: BILATERAL LOWER EXTREMITY VENOUS DOPPLER ULTRASOUND TECHNIQUE: Gray-scale  sonography with graded compression, as well as color Doppler and duplex ultrasound were performed to evaluate the lower extremity deep venous systems from the level of the common femoral vein and including the common femoral, femoral, profunda femoral, popliteal and calf veins including the posterior tibial, peroneal and gastrocnemius veins when visible. The superficial great saphenous vein was also interrogated. Spectral Doppler was utilized to evaluate flow at rest and with distal augmentation maneuvers in the common femoral, femoral and popliteal veins. COMPARISON:  None Available. FINDINGS: RIGHT LOWER EXTREMITY Common Femoral Vein: No evidence of thrombus. Normal compressibility, respiratory phasicity and response to augmentation. Saphenofemoral Junction: No evidence of thrombus. Normal compressibility and flow on color Doppler imaging. Profunda Femoral Vein: No evidence of thrombus. Normal compressibility and flow on color Doppler imaging. Femoral Vein: No evidence of thrombus. Normal compressibility, respiratory phasicity and response to augmentation. Popliteal Vein: No evidence of thrombus. Normal compressibility, respiratory phasicity and response to augmentation. Calf Veins: No evidence of thrombus. Normal compressibility and flow on color Doppler imaging. Superficial Great Saphenous Vein: No evidence of thrombus. Normal compressibility. Venous Reflux:  None. Other Findings:  None. LEFT LOWER EXTREMITY Common Femoral Vein: No evidence of thrombus. Normal compressibility, respiratory phasicity and response to augmentation. Saphenofemoral Junction: No evidence of thrombus. Normal compressibility and flow on color Doppler imaging. Profunda Femoral Vein: No evidence of thrombus. Normal compressibility and flow on color Doppler imaging. Femoral Vein: No evidence of thrombus. Normal compressibility, respiratory phasicity and response to augmentation. Popliteal Vein: No evidence of thrombus. Normal  compressibility, respiratory phasicity and response to augmentation. Calf Veins: The posterior tibial vein is  occluded at the mid calf due to acute DVT. Superficial Great Saphenous Vein: No evidence of thrombus. Normal compressibility. Venous Reflux:  None. Other Findings:  None. IMPRESSION: Acute occlusive DVT in the left posterior tibial vein at the mid calf. No DVT on the right. These results were called by telephone at the time of interpretation on 01/27/2022 at 6:35 pm to provider Ivor Costa , who verbally acknowledged these results. Electronically Signed   By: Placido Sou M.D.   On: 01/27/2022 18:36    US ABDOMEN LIMITED RUQ (LIVER/GB)   Result Date: 01/27/2022 CLINICAL DATA:  Right upper quadrant abdominal pain EXAM: ULTRASOUND ABDOMEN LIMITED RIGHT UPPER QUADRANT COMPARISON:  CT same day FINDINGS: Gallbladder: Gallbladder wall thickening up to 9 mm. No Murphy sign. 2.6 cm gallstone at the gallbladder neck. Findings worrisome for acute cholecystitis despite the absence of a Murphy sign. Common bile duct: Diameter: Dilated to 12 mm.  No visible ductal stone. Liver: Normal appearance of the liver parenchyma itself. Portal vein is patent on color Doppler imaging with normal direction of blood flow towards the liver. Other: None. IMPRESSION: 1. 2.6 cm gallstone at the gallbladder neck with gallbladder wall thickening. No Murphy sign. Findings worrisome for acute cholecystitis, despite the absence of a demonstrable Murphy sign. 2. Dilated common bile duct to 12 mm. No visible ductal stone. Electronically Signed   By: Nelson Chimes M.D.   On: 01/27/2022 14:41    CT Angio Chest PE W and/or Wo Contrast   Result Date: 01/27/2022 CLINICAL DATA:  Pulmonary embolism (PE) suspected, high prob; Abdominal pain, acute, nonlocalized EXAM: CT ANGIOGRAPHY CHEST CT ABDOMEN AND PELVIS WITH CONTRAST TECHNIQUE: Multidetector CT imaging of the chest was performed using the standard protocol during bolus administration of  intravenous contrast. Multiplanar CT image reconstructions and MIPs were obtained to evaluate the vascular anatomy. Multidetector CT imaging of the abdomen and pelvis was performed using the standard protocol during bolus administration of intravenous contrast. RADIATION DOSE REDUCTION: This exam was performed according to the departmental dose-optimization program which includes automated exposure control, adjustment of the mA and/or kV according to patient size and/or use of iterative reconstruction technique. CONTRAST:  87m OMNIPAQUE IOHEXOL 350 MG/ML SOLN COMPARISON:  None Available. FINDINGS: CTA CHEST FINDINGS Cardiovascular: Satisfactory opacification of the pulmonary arteries to the segmental level. No evidence of pulmonary embolism. Pulmonary trunk measures 3.5 cm in diameter. Thoracic aorta is nonaneurysmal. Scattered atherosclerotic vascular calcifications of the aorta and coronary arteries. Heart size is upper limits of normal. No pericardial effusion. Mediastinum/Nodes: No enlarged mediastinal, hilar, or axillary lymph nodes. Thyroid gland, trachea, and esophagus demonstrate no significant findings. Lungs/Pleura: Trace layering bilateral pleural effusions. Mild bibasilar subsegmental atelectasis. No pneumothorax. Musculoskeletal: No chest wall abnormality. No acute or significant osseous findings. Degenerative thoracic spondylosis. Review of the MIP images confirms the above findings. CT ABDOMEN and PELVIS FINDINGS Hepatobiliary: 4.4 cm stone in the region of the gallbladder neck. Numerous small stones and/or sludge layering within the gallbladder fundus. Gallbladder is moderately distended with diffuse gallbladder wall thickening. No focal liver lesion is identified. There is mild intra and extrahepatic biliary ductal dilatation. Pancreas: Fatty atrophy of the pancreas. No focal pancreatic lesion. No ductal dilatation or inflammatory changes. Spleen: Normal in size without focal abnormality.  Adrenals/Urinary Tract: Unremarkable adrenal glands. Kidneys enhance symmetrically without solid lesion, stone, or hydronephrosis. Ureters are nondilated. Urinary bladder appears unremarkable for the degree of distention. Stomach/Bowel: Stomach is within normal limits. Appendix appears normal. Scattered colonic diverticulosis. No  evidence of bowel wall thickening, distention, or inflammatory changes. Vascular/Lymphatic: Non-enhancement of the left portal vein suspicious for portal vein thrombosis (series 2, image 26). Main portal vein and right portal veins enhance homogeneously. Aortoiliac atherosclerosis. No abdominopelvic lymphadenopathy. Reproductive: Status post hysterectomy. No adnexal masses. Other: No free fluid. No abdominopelvic fluid collection. No pneumoperitoneum. Musculoskeletal: No acute or significant osseous findings. Degenerative lumbar spondylosis. Review of the MIP images confirms the above findings. IMPRESSION: CTA Chest: 1. No evidence of pulmonary embolism. 2. Trace layering bilateral pleural effusions with mild bibasilar subsegmental atelectasis. 3. Mildly dilated pulmonary trunk suggesting pulmonary arterial hypertension. 4. Aortic and coronary artery atherosclerosis. CT Abdomen/Pelvis: 1. Non-enhancement of the left portal vein suspicious for portal vein thrombosis. Main portal vein and right portal veins enhance homogeneously. 2. Cholelithiasis with a 4.4 cm stone in the region of the gallbladder neck. Gallbladder is moderately distended with diffuse gallbladder wall thickening and mild intra and extrahepatic biliary ductal dilatation. Findings are concerning for acute cholecystitis. 3. Colonic diverticulosis without evidence of acute diverticulitis. 4. Aortic atherosclerosis (ICD10-I70.0). Electronically Signed   By: Davina Poke D.O.   On: 01/27/2022 13:38    CT ABDOMEN PELVIS W CONTRAST   Result Date: 01/27/2022 CLINICAL DATA:  Pulmonary embolism (PE) suspected, high prob;  Abdominal pain, acute, nonlocalized EXAM: CT ANGIOGRAPHY CHEST CT ABDOMEN AND PELVIS WITH CONTRAST TECHNIQUE: Multidetector CT imaging of the chest was performed using the standard protocol during bolus administration of intravenous contrast. Multiplanar CT image reconstructions and MIPs were obtained to evaluate the vascular anatomy. Multidetector CT imaging of the abdomen and pelvis was performed using the standard protocol during bolus administration of intravenous contrast. RADIATION DOSE REDUCTION: This exam was performed according to the departmental dose-optimization program which includes automated exposure control, adjustment of the mA and/or kV according to patient size and/or use of iterative reconstruction technique. CONTRAST:  4m OMNIPAQUE IOHEXOL 350 MG/ML SOLN COMPARISON:  None Available. FINDINGS: CTA CHEST FINDINGS Cardiovascular: Satisfactory opacification of the pulmonary arteries to the segmental level. No evidence of pulmonary embolism. Pulmonary trunk measures 3.5 cm in diameter. Thoracic aorta is nonaneurysmal. Scattered atherosclerotic vascular calcifications of the aorta and coronary arteries. Heart size is upper limits of normal. No pericardial effusion. Mediastinum/Nodes: No enlarged mediastinal, hilar, or axillary lymph nodes. Thyroid gland, trachea, and esophagus demonstrate no significant findings. Lungs/Pleura: Trace layering bilateral pleural effusions. Mild bibasilar subsegmental atelectasis. No pneumothorax. Musculoskeletal: No chest wall abnormality. No acute or significant osseous findings. Degenerative thoracic spondylosis. Review of the MIP images confirms the above findings. CT ABDOMEN and PELVIS FINDINGS Hepatobiliary: 4.4 cm stone in the region of the gallbladder neck. Numerous small stones and/or sludge layering within the gallbladder fundus. Gallbladder is moderately distended with diffuse gallbladder wall thickening. No focal liver lesion is identified. There is mild  intra and extrahepatic biliary ductal dilatation. Pancreas: Fatty atrophy of the pancreas. No focal pancreatic lesion. No ductal dilatation or inflammatory changes. Spleen: Normal in size without focal abnormality. Adrenals/Urinary Tract: Unremarkable adrenal glands. Kidneys enhance symmetrically without solid lesion, stone, or hydronephrosis. Ureters are nondilated. Urinary bladder appears unremarkable for the degree of distention. Stomach/Bowel: Stomach is within normal limits. Appendix appears normal. Scattered colonic diverticulosis. No evidence of bowel wall thickening, distention, or inflammatory changes. Vascular/Lymphatic: Non-enhancement of the left portal vein suspicious for portal vein thrombosis (series 2, image 26). Main portal vein and right portal veins enhance homogeneously. Aortoiliac atherosclerosis. No abdominopelvic lymphadenopathy. Reproductive: Status post hysterectomy. No adnexal masses. Other: No free fluid. No abdominopelvic  fluid collection. No pneumoperitoneum. Musculoskeletal: No acute or significant osseous findings. Degenerative lumbar spondylosis. Review of the MIP images confirms the above findings. IMPRESSION: CTA Chest: 1. No evidence of pulmonary embolism. 2. Trace layering bilateral pleural effusions with mild bibasilar subsegmental atelectasis. 3. Mildly dilated pulmonary trunk suggesting pulmonary arterial hypertension. 4. Aortic and coronary artery atherosclerosis. CT Abdomen/Pelvis: 1. Non-enhancement of the left portal vein suspicious for portal vein thrombosis. Main portal vein and right portal veins enhance homogeneously. 2. Cholelithiasis with a 4.4 cm stone in the region of the gallbladder neck. Gallbladder is moderately distended with diffuse gallbladder wall thickening and mild intra and extrahepatic biliary ductal dilatation. Findings are concerning for acute cholecystitis. 3. Colonic diverticulosis without evidence of acute diverticulitis. 4. Aortic atherosclerosis  (ICD10-I70.0). Electronically Signed   By: Davina Poke D.O.   On: 01/27/2022 13:38    DG Chest 2 View   Result Date: 01/27/2022 CLINICAL DATA:  Fevers EXAM: CHEST - 2 VIEW COMPARISON:  08/03/16 CXR FINDINGS: No pleural effusion. No pneumothorax. Normal cardiac and mediastinal contours. No displaced rib fractures. Degenerative changes of the bilateral glenohumeral joints, progressed on the right compared to 2018. Vertebral body heights are maintained. Rounded densities in the upper abdomen only visualized on the lateral view could represent a combination of renal and gallstones. IMPRESSION: 1.  No active cardiopulmonary disease. 2.  Possible small left sided renal stone. Electronically Signed   By: Marin Roberts M.D.   On: 01/27/2022 10:06              Assessment and Plan:   Klebsiella Sepsis Acute Cholecystitis/Gall stones: WBC 16.4, temperature 98.4, heart rate 72, RR 24, does not meet criteria for sepsis.  -- Consulted Dr. Lysle Pearl of surgery--recommends GB drain placement per IR, surgery signed off. F/u outpt  -IV Zosyn in ED).  Patient has dilated CBD.   --Consulted Dr. Haig Prophet of GI. -Blood culture 1/2 Colette Ribas -As needed morphine, Zofran, oxycodone -MRCP noted --trial of Toradol --pt has continued pain with increased wbc to 44K--repeated CT abd with contrast--shows abnormal position of the GB drain. Dr Haig Prophet aware --I have paged/sent chat message to IR Dr Denna Haggard --wbc today 47K -- pt scheduled for redo GB drain this morning --will move her to step down for close monitoring given severe sepsis,ATN/ARF and hypotension   Acute on Chronic kidney disease, stage 3a [facility]): Renal function is worsening than baseline.  Recent baseline creatinine 0.9 01/11/2022.  -Hold Cozaar --come in with creat 1.2--1.46--2.2--2.5 (baseline 0.9 --12/21/21) --cont IVF. Pt got contrast dye, IV toradol (other pain meds did not work) -- nephrology consultation with Dr Candiss Norse --UOP poor  NSTEMI  (non-ST elevated myocardial infarction) and hx of CAD: s/p of stent Consulted Dr. Annette Stable -IV heparin gtt -ASA, Zetia and Crestor -trend trop peaked 74 -echo done --Last stent placed in 2014 --Myoview stress test ---negative per Dr Oval Linsey. Ok to stop heparin gtt   Controlled type 2 diabetes mellitus with diabetic nephropathy ([place]): Recent A1c 6.6, well-controlled.  Patient taking metformin and Ozempic at home. -SSI   HTN (hypertension) -Hold home blood pressure medications including amlodipine, Coreg, Cozaar due to soft blood pressure -IV hydralazine as needed  Acute Left post tibial vein DVT (deep venous thrombosis) ([place]): CTA negative for PE -IV heparin --will change to po DOAC after cardiac w/u is completed   Hypothyroidism -Synthroid   Hyperlipidemia associated with type 2 diabetes mellitus (HCC) -Cresto and Zetia   Chronic diastolic CHF (congestive heart failure) ([place]): Recent 2D echo  11/26/2020 showed EF of 50-55% with grade 2 diastolic dysfunction.  Patient has trace leg edema, does not seem to have CHF exacerbation. -stable   Thrombocytopenia ([place]): Platelet 118 -LDH  120 and peripheral smear plt normal morphology   Major depressive disorder, recurrent, in remission ([place]) -Continue home medications   Obstructive sleep apnea - on CPAP   Portal vein thrombosis -On IV heparin   Obesity (BMI 30.0-34.9): BMI= 35.99  and BW=101.2 -Diet and exercise.   -Encourage to lose weight.  Critically ill. Pt understands       Procedures s/p GB drain placement by IR on 01/28/2022 Family communication :dter Aimee Consults : general surgery, cardiology, G.I., IR CODE STATUS: full code DVT Prophylaxis : heparin drip Level of care: Progressive Status is: Inpatient Remains inpatient appropriate because: sepsis due to acute cholecystitis, non-STEMI, Acute DVT    TOTAL TIME TAKING CARE OF THIS PATIENT: 35 minutes.  >50% time spent on counselling and  coordination of care  Note: This dictation was prepared with Dragon dictation along with smaller phrase technology. Any transcriptional errors that result from this process are unintentional.  Fritzi Mandes M.D    [facility]   CC: Primary care physician; Olin Hauser, DO

## 2022-01-31 NOTE — Progress Notes (Signed)
Patient tolerated procedure well. Did not require moderate sedation for procedure as patient had received Dilaudid '1mg'$  for pain from staff RN 20 minutes prior to procedure. Patient has new cholecystostomy drain site and drain in place with new dressing.  Previous drain site ~1 inch below new drain.  Per Dr. Darreld Mclean. El-Abd, change small transparent dressing with gauze over previous drain site frequently.  Report given to Ebony Hail, RN 2A and demonstrated previous drain site dressing change.

## 2022-01-31 NOTE — Progress Notes (Signed)
Both forearms assessed thoroughly with ultrasound; veins in each forearm are deeper than 65m, which does not allow for an ultrasound guided iv to be placed for vasopressors; current site near left posterior forearm area does still have a blood return , and a "watch" is in place; emphasized to both the RN and pt to watch the site for redness, swelling or pain ; BP at this time is 100/47.

## 2022-01-31 NOTE — Plan of Care (Signed)

## 2022-02-01 ENCOUNTER — Other Ambulatory Visit: Payer: Medicare Other

## 2022-02-01 ENCOUNTER — Inpatient Hospital Stay: Payer: Medicare Other

## 2022-02-01 LAB — HEPARIN LEVEL (UNFRACTIONATED)
Heparin Unfractionated: 0.44 [IU]/mL (ref 0.30–0.70)
Heparin Unfractionated: 0.63 IU/mL (ref 0.30–0.70)

## 2022-02-01 LAB — GLUCOSE, CAPILLARY
Glucose-Capillary: 188 mg/dL — ABNORMAL HIGH (ref 70–99)
Glucose-Capillary: 175 mg/dL — ABNORMAL HIGH (ref 70–99)
Glucose-Capillary: 158 mg/dL — ABNORMAL HIGH (ref 70–99)
Glucose-Capillary: 177 mg/dL — ABNORMAL HIGH (ref 70–99)

## 2022-02-01 LAB — CULTURE, BLOOD (ROUTINE X 2)
Culture: NO GROWTH
Special Requests: ADEQUATE

## 2022-02-01 LAB — COMPREHENSIVE METABOLIC PANEL
ALT: 11 U/L (ref 0–44)
AST: 21 U/L (ref 15–41)
Albumin: 1.6 g/dL — ABNORMAL LOW (ref 3.5–5.0)
Alkaline Phosphatase: 101 U/L (ref 38–126)
Anion gap: 11 (ref 5–15)
BUN: 43 mg/dL — ABNORMAL HIGH (ref 8–23)
CO2: 13 mmol/L — ABNORMAL LOW (ref 22–32)
Calcium: 6.7 mg/dL — ABNORMAL LOW (ref 8.9–10.3)
Chloride: 103 mmol/L (ref 98–111)
Creatinine, Ser: 2.66 mg/dL — ABNORMAL HIGH (ref 0.44–1.00)
GFR, Estimated: 18 mL/min — ABNORMAL LOW (ref >60)
Glucose, Bld: 217 mg/dL — ABNORMAL HIGH (ref 70–99)
Potassium: 3.9 mmol/L (ref 3.5–5.1)
Sodium: 127 mmol/L — ABNORMAL LOW (ref 135–145)
Total Bilirubin: 1.5 mg/dL — ABNORMAL HIGH (ref 0.3–1.2)
Total Protein: 5.1 g/dL — ABNORMAL LOW (ref 6.5–8.1)

## 2022-02-01 LAB — CBC
HCT: 26.8 % — ABNORMAL LOW (ref 36.0–46.0)
Hemoglobin: 8.4 g/dL — ABNORMAL LOW (ref 12.0–15.0)
MCH: 29.1 pg (ref 26.0–34.0)
MCHC: 31.3 g/dL (ref 30.0–36.0)
MCV: 92.7 fL (ref 80.0–100.0)
Platelets: 266 10*3/uL (ref 150–400)
RBC: 2.89 MIL/uL — ABNORMAL LOW (ref 3.87–5.11)
RDW: 15.3 % (ref 11.5–15.5)
WBC: 39.5 10*3/uL — ABNORMAL HIGH (ref 4.0–10.5)
nRBC: 0 % (ref 0.0–0.2)

## 2022-02-01 LAB — LACTIC ACID, PLASMA: Lactic Acid, Venous: 1 mmol/L (ref 0.5–1.9)

## 2022-02-01 MED ORDER — HYDROCORTISONE SOD SUC (PF) 100 MG IJ SOLR
50.0000 mg | Freq: Four times a day (QID) | INTRAMUSCULAR | Status: DC
  Administered 2022-02-01 – 2022-02-03 (×9): 50 mg via INTRAVENOUS
  Filled 2022-02-01 (×9): qty 2

## 2022-02-01 MED ORDER — SODIUM BICARBONATE 650 MG PO TABS
1300.0000 mg | ORAL_TABLET | Freq: Two times a day (BID) | ORAL | Status: DC
  Administered 2022-02-01 – 2022-02-06 (×11): 1300 mg via ORAL
  Filled 2022-02-01 (×11): qty 2

## 2022-02-01 MED ORDER — LACTATED RINGERS IV BOLUS
500.0000 mL | Freq: Once | INTRAVENOUS | Status: AC
  Administered 2022-02-01: 500 mL via INTRAVENOUS

## 2022-02-01 MED ORDER — LACTATED RINGERS IV BOLUS: 1000.0000 mL | Freq: Once | INTRAVENOUS | Status: DC

## 2022-02-01 MED ORDER — HYDROCORTISONE NICU INJ SYRINGE 50 MG/ML: 50.0000 mg | Freq: Four times a day (QID) | INTRAVENOUS | Status: DC

## 2022-02-01 MED ORDER — NOREPINEPHRINE 4 MG/250ML-% IV SOLN
0.0000 ug/min | INTRAVENOUS | Status: DC
  Administered 2022-02-01: 26 ug/min via INTRAVENOUS
  Administered 2022-02-01: 14 ug/min via INTRAVENOUS
  Administered 2022-02-01: 30 ug/min via INTRAVENOUS
  Filled 2022-02-01 (×2): qty 250

## 2022-02-01 MED ORDER — MIDODRINE HCL 5 MG PO TABS
10.0000 mg | ORAL_TABLET | Freq: Three times a day (TID) | ORAL | Status: DC
  Administered 2022-02-01 – 2022-02-04 (×11): 10 mg via ORAL
  Filled 2022-02-01 (×11): qty 2

## 2022-02-01 MED ORDER — LACTATED RINGERS IV BOLUS
1000.0000 mL | Freq: Once | INTRAVENOUS | Status: AC
  Administered 2022-02-01: 1000 mL via INTRAVENOUS

## 2022-02-01 MED ORDER — NOREPINEPHRINE 16 MG/250ML-% IV SOLN
0.0000 ug/min | INTRAVENOUS | Status: DC
  Administered 2022-02-01: 3 ug/min via INTRAVENOUS
  Administered 2022-02-01: 10 ug/min via INTRAVENOUS
  Filled 2022-02-01 (×3): qty 250

## 2022-02-01 MED ORDER — FAMOTIDINE 20 MG PO TABS
20.0000 mg | ORAL_TABLET | Freq: Every day | ORAL | Status: DC
  Administered 2022-02-01 – 2022-02-05 (×5): 20 mg via ORAL
  Filled 2022-02-01 (×5): qty 1

## 2022-02-01 MED ORDER — VASOPRESSIN 20 UNITS/100 ML INFUSION FOR SHOCK
0.0000 [IU]/min | INTRAVENOUS | Status: DC
  Administered 2022-02-01 – 2022-02-02 (×3): 0.03 [IU]/min via INTRAVENOUS
  Filled 2022-02-01 (×3): qty 100

## 2022-02-01 NOTE — Progress Notes (Signed)
1930 patient alert x4 able to make all needs known family at bedside

## 2022-02-01 NOTE — Plan of Care (Signed)

## 2022-02-01 NOTE — Progress Notes (Signed)
[facility]  ROUNDING NOTE   Subjective:   Patient seen and evaluated at bedside in ICU Friend currently at bedside Patient states she feels well today Denies pain or discomfort States she does have more swelling in her lower extremities and hands than normal  Room air Pressors: Levo and vaso Heparin drip in place Lower extremity edema present  Objective:  Vital signs in last 24 hours:  Temp:  [97.7 F (36.5 C)-98.1 F (36.7 C)] 97.7 F (36.5 C) (12/04 0800) Pulse Rate:  [54-87] 64 (12/04 1300) Resp:  [9-27] 15 (12/04 1300) BP: (74-148)/(33-59) 125/57 (12/04 1300) SpO2:  [97 %-100 %] 100 % (12/04 1300)  Weight change: -2.2 kg Filed Weights   01/29/22 0422 01/31/22 0500 01/31/22 1205  Weight: 103.3 kg 109.6 kg 107.4 kg    Intake/Output: I/O last 3 completed shifts: In: 6446.9 [P.O.:300; I.V.:5417.1; Other:10; IV Piggyback:719.8] Out: 550 [Urine:250; Drains:300]   Intake/Output this shift:  Total I/O In: 23.8 [I.V.:13.8; Other:10] Out: 190 [Drains:190]  Physical Exam: General: NAD  Head: Normocephalic, atraumatic.  Dry oral mucosal membranes  Eyes: Anicteric  Lungs:  Clear to auscultation  Heart: Regular rate and rhythm  Abdomen:  Soft, LLQ tender, drain in place  Extremities: Trace peripheral edema.  Neurologic: Nonfocal, moving all four extremities  Skin: No lesions  Access: None    Basic Metabolic Panel: Recent Labs  Lab 01/28/22 0322 01/29/22 0805 01/30/22 1330 01/31/22 0508 02/01/22 0420  NA 133* 129* 128* 131* 127*  K 3.7 4.3 3.9 4.1 3.9  CL 104 101 101 106 103  CO2 20* 15* 15* 12* 13*  GLUCOSE 113* 213* 153* 125* 217*  BUN 35* 36* 42* 42* 43*  CREATININE 1.21* 1.46* 2.29* 2.54* 2.66*  CALCIUM 7.5* 7.7* 7.3* 7.1* 6.7*    Liver Function Tests: Recent Labs  Lab 01/27/22 0942 01/31/22 0508 02/01/22 0420  AST 35 19 21  ALT '20 11 11  '$ ALKPHOS 115 96 101  BILITOT 0.6 1.7* 1.5*  PROT 6.0* 5.1* 5.1*  ALBUMIN 2.3* 1.7* 1.6*    No results for input(s): "LIPASE", "AMYLASE" in the last 168 hours. No results for input(s): "AMMONIA" in the last 168 hours.  CBC: Recent Labs  Lab 01/27/22 0942 01/28/22 0322 01/29/22 0822 01/30/22 0716 01/31/22 0508 02/01/22 0420  WBC 6.4 16.4* 25.5* 44.6* 47.4* 39.5*  NEUTROABS 5.4  --   --   --   --   --   HGB 9.6* 9.3* 10.7* 9.7* 8.6* 8.4*  HCT 30.1* 28.8* 33.7* 30.8* 27.7* 26.8*  MCV 93.5 91.1 92.8 93.3 95.2 92.7  PLT 118* 112* 141* 186 205 266    Cardiac Enzymes: No results for input(s): "CKTOTAL", "CKMB", "CKMBINDEX", "TROPONINI" in the last 168 hours.  BNP: Invalid input(s): "POCBNP"  CBG: Recent Labs  Lab 01/31/22 0759 01/31/22 1205 01/31/22 2121 02/01/22 0722 02/01/22 1237  GLUCAP 126* 109* 139* 188* 175*    Microbiology: Results for orders placed or performed during the hospital encounter of 01/27/22  Blood culture (routine x 2)     Status: Abnormal   Collection Time: 01/27/22 10:48 AM   Specimen: BLOOD  Result Value Ref Range Status   Specimen Description   Final    BLOOD BLOOD LEFT ARM Performed at [facility], [address], [place], [place] [address]    Special Requests   Final    BOTTLES DRAWN AEROBIC AND ANAEROBIC Blood Culture results may not be optimal due to an excessive volume of blood received in  culture bottles Performed at [facility], [address] [place], [place] [address]    Culture  Setup Time   Final    GRAM NEGATIVE RODS AEROBIC BOTTLE ONLY CRITICAL RESULT CALLED TO, READ BACK BY AND VERIFIED WITH: Loura Back PHARMD 0800 01/28/22 HNM Performed at [facility], [place] [address], [place], [place] [address]    Culture KLEBSIELLA PNEUMONIAE (A)  Final   Report Status 01/30/2022 FINAL  Final   Organism ID, Bacteria KLEBSIELLA PNEUMONIAE  Final      Susceptibility   Klebsiella pneumoniae - MIC*    AMPICILLIN >=32 RESISTANT Resistant     CEFAZOLIN <=4 SENSITIVE Sensitive     CEFEPIME  <=0.12 SENSITIVE Sensitive     CEFTAZIDIME <=1 SENSITIVE Sensitive     CEFTRIAXONE <=0.25 SENSITIVE Sensitive     CIPROFLOXACIN <=0.25 SENSITIVE Sensitive     GENTAMICIN <=1 SENSITIVE Sensitive     IMIPENEM <=0.25 SENSITIVE Sensitive     TRIMETH/SULFA <=20 SENSITIVE Sensitive     AMPICILLIN/SULBACTAM 4 SENSITIVE Sensitive     PIP/TAZO <=4 SENSITIVE Sensitive     * KLEBSIELLA PNEUMONIAE  Resp Panel by RT-PCR (Flu A&B, Covid) Urine, Clean Catch     Status: None   Collection Time: 01/27/22 10:48 AM   Specimen: Urine, Clean Catch; Nasal Swab  Result Value Ref Range Status   SARS Coronavirus 2 by RT PCR NEGATIVE NEGATIVE Final    Comment: (NOTE) SARS-CoV-2 target nucleic acids are NOT DETECTED.  The SARS-CoV-2 RNA is generally detectable in upper respiratory specimens during the acute phase of infection. The lowest concentration of SARS-CoV-2 viral copies this assay can detect is 138 copies/mL. A negative result does not preclude SARS-Cov-2 infection and should not be used as the sole basis for treatment or other patient management decisions. A negative result may occur with  improper specimen collection/handling, submission of specimen other than nasopharyngeal swab, presence of viral mutation(s) within the areas targeted by this assay, and inadequate number of viral copies(<138 copies/mL). A negative result must be combined with clinical observations, patient history, and epidemiological information. The expected result is Negative.  Fact Sheet for Patients:  EntrepreneurPulse.com.au  Fact Sheet for Healthcare Providers:  IncredibleEmployment.be  This test is no t yet approved or cleared by the [place] FDA and  has been authorized for detection and/or diagnosis of SARS-CoV-2 by FDA under an Emergency Use Authorization (EUA). This EUA will remain  in effect (meaning this test can be used) for the duration of the COVID-19 declaration under  Section 564(b)(1) of the Act, 21 U.S.C.section 360bbb-3(b)(1), unless the authorization is terminated  or revoked sooner.       Influenza A by PCR NEGATIVE NEGATIVE Final   Influenza B by PCR NEGATIVE NEGATIVE Final    Comment: (NOTE) The Xpert Xpress SARS-CoV-2/FLU/RSV plus assay is intended as an aid in the diagnosis of influenza from Nasopharyngeal swab specimens and should not be used as a sole basis for treatment. Nasal washings and aspirates are unacceptable for Xpert Xpress SARS-CoV-2/FLU/RSV testing.  Fact Sheet for Patients: EntrepreneurPulse.com.au  Fact Sheet for Healthcare Providers: IncredibleEmployment.be  This test is not yet approved or cleared by the [place] FDA and has been authorized for detection and/or diagnosis of SARS-CoV-2 by FDA under an Emergency Use Authorization (EUA). This EUA will remain in effect (meaning this test can be used) for the duration of the COVID-19 declaration under Section 564(b)(1) of the Act, 21 U.S.C. section 360bbb-3(b)(1), unless the authorization is  terminated or revoked.  Performed at [facility], [address] [place], [place] [address]   Blood Culture ID Panel (Reflexed)     Status: Abnormal   Collection Time: 01/27/22 10:48 AM  Result Value Ref Range Status   Enterococcus faecalis NOT DETECTED NOT DETECTED Final   Enterococcus Faecium NOT DETECTED NOT DETECTED Final   Listeria monocytogenes NOT DETECTED NOT DETECTED Final   Staphylococcus species NOT DETECTED NOT DETECTED Final   Staphylococcus aureus (BCID) NOT DETECTED NOT DETECTED Final   Staphylococcus epidermidis NOT DETECTED NOT DETECTED Final   Staphylococcus lugdunensis NOT DETECTED NOT DETECTED Final   Streptococcus species NOT DETECTED NOT DETECTED Final   Streptococcus agalactiae NOT DETECTED NOT DETECTED Final   Streptococcus pneumoniae NOT DETECTED NOT DETECTED Final   Streptococcus pyogenes NOT  DETECTED NOT DETECTED Final   A.calcoaceticus-baumannii NOT DETECTED NOT DETECTED Final   Bacteroides fragilis NOT DETECTED NOT DETECTED Final   Enterobacterales DETECTED (A) NOT DETECTED Final    Comment: Enterobacterales represent a large order of gram negative bacteria, not a single organism. CRITICAL RESULT CALLED TO, READ BACK BY AND VERIFIED WITH: GREGORY GREENWOOD PHARMD 0800 01/28/22 HNM    Enterobacter cloacae complex NOT DETECTED NOT DETECTED Final   Escherichia coli NOT DETECTED NOT DETECTED Final   Klebsiella aerogenes NOT DETECTED NOT DETECTED Final   Klebsiella oxytoca NOT DETECTED NOT DETECTED Final   Klebsiella pneumoniae DETECTED (A) NOT DETECTED Final    Comment: CRITICAL RESULT CALLED TO, READ BACK BY AND VERIFIED WITH: GREGORY GREENWOOD PHARMD 0800 01/28/22 HNM    Proteus species NOT DETECTED NOT DETECTED Final   Salmonella species NOT DETECTED NOT DETECTED Final   Serratia marcescens NOT DETECTED NOT DETECTED Final   Haemophilus influenzae NOT DETECTED NOT DETECTED Final   Neisseria meningitidis NOT DETECTED NOT DETECTED Final   Pseudomonas aeruginosa NOT DETECTED NOT DETECTED Final   Stenotrophomonas maltophilia NOT DETECTED NOT DETECTED Final   Candida albicans NOT DETECTED NOT DETECTED Final   Candida auris NOT DETECTED NOT DETECTED Final   Candida glabrata NOT DETECTED NOT DETECTED Final   Candida krusei NOT DETECTED NOT DETECTED Final   Candida parapsilosis NOT DETECTED NOT DETECTED Final   Candida tropicalis NOT DETECTED NOT DETECTED Final   Cryptococcus neoformans/gattii NOT DETECTED NOT DETECTED Final   CTX-M ESBL NOT DETECTED NOT DETECTED Final   Carbapenem resistance IMP NOT DETECTED NOT DETECTED Final   Carbapenem resistance KPC NOT DETECTED NOT DETECTED Final   Carbapenem resistance NDM NOT DETECTED NOT DETECTED Final   Carbapenem resist OXA 48 LIKE NOT DETECTED NOT DETECTED Final   Carbapenem resistance VIM NOT DETECTED NOT DETECTED Final     Comment: Performed at [facility], [address] [place], [place] [address]  Urine Culture     Status: None   Collection Time: 01/27/22 11:03 AM   Specimen: Urine, Clean Catch  Result Value Ref Range Status   Specimen Description   Final    URINE, CLEAN CATCH Performed at [facility], [address] [place], [place] [address]    Special Requests   Final    NONE Performed at [facility], [address] [place], [place] [address]    Culture   Final    NO GROWTH Performed at [facility], [address] [place], [place] [address]    Report Status 01/28/2022 FINAL  Final  Blood culture (routine x 2)     Status: None   Collection Time:  01/27/22 11:30 AM   Specimen: BLOOD  Result Value Ref Range Status   Specimen Description BLOOD RIGHT ANTECUBITAL  Final   Special Requests   Final    BOTTLES DRAWN AEROBIC AND ANAEROBIC Blood Culture adequate volume   Culture   Final    NO GROWTH 5 DAYS Performed at [facility], [address], [place], [place] [address]    Report Status 02/01/2022 FINAL  Final  Aerobic/Anaerobic Culture w Gram Stain (surgical/deep wound)     Status: None (Preliminary result)   Collection Time: 01/28/22 10:23 AM   Specimen: Gallbladder; Bile  Result Value Ref Range Status   Specimen Description   Final    GALL BLADDER Performed at [facility], [address] [place], [place] [address]    Special Requests   Final    NONE Performed at [facility], [address], [place], [place] [address]    Gram Stain   Final    FEW WBC PRESENT, PREDOMINANTLY PMN FEW GRAM NEGATIVE RODS Performed at [facility], [place] [address], [place], [place] [address]    Culture   Final    ABUNDANT KLEBSIELLA PNEUMONIAE NO ANAEROBES ISOLATED; CULTURE IN PROGRESS FOR 5 DAYS    Report Status PENDING  Incomplete   Organism ID, Bacteria KLEBSIELLA PNEUMONIAE  Final      Susceptibility   Klebsiella  pneumoniae - MIC*    AMPICILLIN >=32 RESISTANT Resistant     CEFAZOLIN <=4 SENSITIVE Sensitive     CEFEPIME <=0.12 SENSITIVE Sensitive     CEFTAZIDIME <=1 SENSITIVE Sensitive     CEFTRIAXONE <=0.25 SENSITIVE Sensitive     CIPROFLOXACIN <=0.25 SENSITIVE Sensitive     GENTAMICIN <=1 SENSITIVE Sensitive     IMIPENEM <=0.25 SENSITIVE Sensitive     TRIMETH/SULFA <=20 SENSITIVE Sensitive     AMPICILLIN/SULBACTAM 8 SENSITIVE Sensitive     PIP/TAZO <=4 SENSITIVE Sensitive     * ABUNDANT KLEBSIELLA PNEUMONIAE  MRSA Next Gen by PCR, Nasal     Status: None   Collection Time: 01/31/22 12:05 PM   Specimen: Nasal Mucosa; Nasal Swab  Result Value Ref Range Status   MRSA by PCR Next Gen NOT DETECTED NOT DETECTED Final    Comment: (NOTE) The GeneXpert MRSA Assay (FDA approved for NASAL specimens only), is one component of a comprehensive MRSA colonization surveillance program. It is not intended to diagnose MRSA infection nor to guide or monitor treatment for MRSA infections. Test performance is not FDA approved in patients less than 31 years old. Performed at [facility], [address] [place],  [address]     Coagulation Studies: No results for input(s): "LABPROT", "INR" in the last 72 hours.  Urinalysis: No results for input(s): "COLORURINE", "LABSPEC", "PHURINE", "GLUCOSEU", "HGBUR", "BILIRUBINUR", "KETONESUR", "PROTEINUR", "UROBILINOGEN", "NITRITE", "LEUKOCYTESUR" in the last 72 hours.  Invalid input(s): "APPERANCEUR"    Imaging: DG Chest Port 1 View  Result Date: 02/01/2022 CLINICAL DATA:  Central line placement EXAM: PORTABLE CHEST 1 VIEW COMPARISON:  01/27/2022 FINDINGS: Right central line tip in the upper right atrium. No pneumothorax. Low lung volumes with bibasilar airspace opacities, new since prior study. Heart is normal size. No visible effusion. No acute bony abnormality. IMPRESSION: Right central line tip in the upper right atrium.  No pneumothorax.  Bilateral lower lobe airspace opacities. This could reflect atelectasis or pneumonia. Electronically Signed   By: Rolm Baptise M.D.   On: 02/01/2022 00:19   IR Perc Cholecystostomy  Result Date:  01/31/2022 INDICATION: Dislodged cholecystostomy tube, leukocytosis and worsening right upper quadrant pain EXAM: Placement of percutaneous cholecystostomy tube using ultrasound and fluoroscopic guidance MEDICATIONS: Per EMR ANESTHESIA/SEDATION: Local analgesia FLUOROSCOPY: Radiation Exposure Index (as provided by the fluoroscopic device): 2.0 minutes (42 mGy) COMPLICATIONS: None immediate. PROCEDURE: Informed written consent was obtained from the patient after a thorough discussion of the procedural risks, benefits and alternatives. All questions were addressed. Maximal Sterile Barrier Technique was utilized including caps, mask, sterile gowns, sterile gloves, sterile drape, hand hygiene and skin antiseptic. A timeout was performed prior to the initiation of the procedure. The patient was placed supine on the exam table. The existing cholecystostomy tube was removed, as it was noted to be dislodged base on recent cross-sectional imaging. The right upper quadrant was prepped and draped in the standard sterile fashion. Ultrasound of the right upper quadrant was performed for planning purposes. This again demonstrated a distended gallbladder with wall edema and large gallstone, consistent with acute cholecystitis. An intercostal transhepatic approach was planned. A relatively high intercostal access was used due to known colonic interposition. Skin entry site was marked, and local analgesia was obtained with 1% lidocaine. Under ultrasound guidance, percutaneous access was obtained into the gallbladder via an intercostal transhepatic approach using a 21 gauge Chiba needle. Access was confirmed with visualization of needle tip within the gallbladder lumen, and free return of bile. An 018 Nitrex wire was then advanced through  the access needle and coiled within the gallbladder lumen. A transition dilator was advanced over this wire, through which an antegrade cholecystogram was performed. Antegrade cholecystogram demonstrated appropriate location in the gallbladder lumen with large central gallstone. Over an Amplatz wire, the percutaneous tract was serially dilated followed by placement of a 10 French locking multipurpose drainage catheter into the gallbladder lumen. Locking loop was formed. Additional biliary sludge was drained. Gentle hand injection of contrast material confirmed location of the gallbladder lumen. The drainage catheter was secured to the skin using silk suture and a dressing. It was placed to bag drainage. The patient tolerated the procedure well without immediate complication. IMPRESSION: Successful placement of a new 10 French percutaneous cholecystostomy tube via intercostal approach. Cholecystostomy tube placed to bag drainage. Electronically Signed   By: Albin Felling M.D.   On: 01/31/2022 12:01     Medications:    sodium chloride 100 mL/hr at 02/01/22 0700   sodium chloride Stopped (01/31/22 2120)   heparin 1,550 Units/hr (02/01/22 0700)   lactated ringers     norepinephrine (LEVOPHED) Adult infusion 4 mcg/min (02/01/22 1307)   piperacillin-tazobactam (ZOSYN)  IV 3.375 g (02/01/22 1336)   vasopressin 0.03 Units/min (02/01/22 0846)    aspirin EC  81 mg Oral Daily   Chlorhexidine Gluconate Cloth  6 each Topical Q0600   ezetimibe  10 mg Oral Daily   famotidine  20 mg Oral QHS   FLUoxetine  10 mg Oral Daily   hydrocortisone sod succinate (SOLU-CORTEF) inj  50 mg Intravenous Q6H   insulin aspart  0-5 Units Subcutaneous QHS   insulin aspart  0-9 Units Subcutaneous TID WC   levothyroxine  125 mcg Oral Q0600   midodrine  10 mg Oral TID WC   polyethylene glycol  17 g Oral Daily   rosuvastatin  20 mg Oral Daily   sodium chloride flush  5 mL Intracatheter Q8H   acetaminophen, ALPRAZolam,  bisacodyl, HYDROmorphone (DILAUDID) injection, levalbuterol, nitroGLYCERIN, ondansetron (ZOFRAN) IV, ondansetron, [COMPLETED] senna-docusate **FOLLOWED BY** senna-docusate  Assessment/ Plan:  Ms. Grace Heinsohn  Simmons is a 76 y.o.  female  with medical problems of  DM-2, CAD, non-STEMI, hypertension, hyperlipidemia, hypothyroidism, chronic diastolic CHF, thrombocytopenia, major depressive disorder in remission, obstructive sleep apnea, portal vein thrombosis, obesity, acute left posterior tibial vein DVT,  was admitted on 01/27/2022 for Cholecystitis [K81.9] NSTEMI (non-ST elevated myocardial infarction) ([place]) [I21.4] Fever, unspecified fever cause [R50.9]   Acute kidney injury, acute metabolic acidosis nongap, Sepsis Likely ATN due to multifactorial etiology from hypotension, Klebsiella sepsis, IV contrast exposure, IV Toradol (nonsteroidals) Serum mean trends are worsening.  Baseline creatinine of 0.9, GFR greater than 60 from 12/11/2020.  Lab Results  Component Value Date   CREATININE 2.66 (H) 02/01/2022   CREATININE 2.54 (H) 01/31/2022   CREATININE 2.29 (H) 01/30/2022    Intake/Output Summary (Last 24 hours) at 02/01/2022 1350 Last data filed at 02/01/2022 1246 Gross per 24 hour  Intake 4296.75 ml  Output 720 ml  Net 3576.75 ml   Renal function slightly worse today, urine output 260m in past 24 hours. Continues to receive treatment for cholecystitis. Will order sodium bicarb '1300mg'$  twice daily. Continue to avoid nephrotoxic agents and therapies. Discussed with patient that renal function will slowly improve as the underlying illnesses are treated.     LOS: 5 Malina Geers 12/4/20231:50 PM

## 2022-02-01 NOTE — Progress Notes (Signed)
ANTICOAGULATION CONSULT NOTE  Pharmacy Consult for Heparin Infusion Indication: DVT  No Known Allergies  Patient Measurements: Height: '5\' 6"'$  (167.6 cm) Weight: 107.4 kg (236 lb 12.4 oz) IBW/kg (Calculated) : 59.3 Heparin Dosing Weight: 82.9 kg  Vital Signs: Temp: 97.7 F (36.5 C) (12/04 0800) Temp Source: Oral (12/04 0800) BP: 125/41 (12/04 0715) Pulse Rate: 64 (12/04 0715)  Labs: Recent Labs    01/30/22 0716 01/30/22 1330 01/31/22 0508 02/01/22 0028 02/01/22 0420  HGB 9.7*  --  8.6*  --  8.4*  HCT 30.8*  --  27.7*  --  26.8*  PLT 186  --  205  --  266  HEPARINUNFRC 0.39  --  <0.10* 0.44  --   CREATININE  --  2.29* 2.54*  --  2.66*     Estimated Creatinine Clearance: 22.3 mL/min (A) (by C-G formula based on SCr of 2.66 mg/dL (H)).   Medical History: Past Medical History:  Diagnosis Date   Coronary artery disease    a. 07/2013 STEMI/PCI ([place]): LM nl, LAD 100p (thrombectomy & 3.5x23 Alpine DES), LCX nl, RCA nl, EF 45%.   Depression    Diastolic dysfunction    a.) TTE 11/26/2020 - LVEF 50-55%; G2DD.   Endometrial cancer ([place])    Gravida 3 para 3    Hyperlipidemia    Hypertension    Hypertensive heart disease    Hypothyroid    Ischemic cardiomyopathy    a.) 07/2013 EF 45% @ time of STEMI; b.) 11/2013 Echo: EF 55-60%, mildly dil LA, nl RV fxn. c.) TTE 07/29/2015 - LVEF 55-60%, mild LA and RV dilation. d.) TTE 11/26/2020 - LVEF 50-55%; G2DD.   Menopause    Obstructive sleep apnea on CPAP    Osteoarthritis    PONV (postoperative nausea and vomiting)    ST elevation myocardial infarction (STEMI) of anterolateral wall ([place]) 08/14/2013   a.) LVEF reduced at 45%; Tx'd with trombectomy and PCI to 100% LAD; 3.5 x 23 mm Xience Alpine DES placed.   T2DM (type 2 diabetes mellitus) ([place])    Thyroid cancer ([place])    a.) s/p total thyroidectomy. b.) on daily levothyroxine.    Medications:  Scheduled:   aspirin EC  81 mg Oral Daily   Chlorhexidine Gluconate Cloth  6 each  Topical Q0600   ezetimibe  10 mg Oral Daily   FLUoxetine  10 mg Oral Daily   hydrocortisone sod succinate (SOLU-CORTEF) inj  50 mg Intravenous Q6H   insulin aspart  0-5 Units Subcutaneous QHS   insulin aspart  0-9 Units Subcutaneous TID WC   levothyroxine  125 mcg Oral Q0600   midodrine  10 mg Oral TID WC   polyethylene glycol  17 g Oral Daily   rosuvastatin  20 mg Oral Daily   sodium chloride flush  5 mL Intracatheter Q8H   Infusions:   sodium chloride 100 mL/hr at 02/01/22 0700   sodium chloride Stopped (01/31/22 2120)   heparin 1,550 Units/hr (02/01/22 0700)   norepinephrine (LEVOPHED) Adult infusion 10 mcg/min (02/01/22 0758)   piperacillin-tazobactam (ZOSYN)  IV 12.5 mL/hr at 02/01/22 0700   vasopressin 0.03 Units/min (02/01/22 0846)   PRN: acetaminophen, ALPRAZolam, bisacodyl, HYDROmorphone (DILAUDID) injection, levalbuterol, nitroGLYCERIN, ondansetron (ZOFRAN) IV, ondansetron, [COMPLETED] senna-docusate **FOLLOWED BY** senna-docusate  Assessment: [name] is a 76 y.o. female presenting with elevated troponins and D-Dimer. PMH significant for CAD (STEMI s/p stent 2015), HLD, HTN, DM, hypothyroidism (s/p thyroidectomy), depression, OSA on CPAP, endometrial cancer, dCHF, knee replacements (R-11/2020,  L-11/2021), CKD-3 . Patient was not on [facility] PTA per chart review. [place] found acute occlusive DVT in the left posterior tibial vein at the mid calf. Pharmacy has been consulted to initiate and manage heparin infusion.   Labs for monitoring heparin infusion have been delayed on multiple occasions due to inability to draw sample.   Baseline Labs: aPTT 42, PT 14.2, INR 1.1, Hgb 9.6, Hct 30.1, Plt 118   Goal of Therapy:  Heparin level 0.3-0.7 units/ml Monitor platelets by anticoagulation protocol: Yes   Plan: heparin level therapeutic x 2 Continue heparin drip at 1550 units/hr Recheck heparin level once daily, next 12/05 am *completed NSTEMI tx, now continuing for DVT  treatment CBC daily while on heparin infusion   Vallery Sa, PharmD, BCPS 02/01/2022 8:50 AM

## 2022-02-01 NOTE — Progress Notes (Signed)
ANTICOAGULATION CONSULT NOTE  Pharmacy Consult for Heparin Infusion Indication: DVT  No Known Allergies  Patient Measurements: Height: '5\' 6"'$  (167.6 cm) Weight: 107.4 kg (236 lb 12.4 oz) IBW/kg (Calculated) : 59.3 Heparin Dosing Weight: 82.9 kg  Vital Signs: Temp: 98 F (36.7 C) (12/04 0000) Temp Source: Oral (12/04 0000) BP: 104/40 (12/04 0015) Pulse Rate: 72 (12/04 0015)  Labs: Recent Labs     0000 01/29/22 0805 01/29/22 0822 01/29/22 1102 01/30/22 0716 01/30/22 1330 01/31/22 0508 02/01/22 0028  HGB   < >  --  10.7*  --  9.7*  --  8.6*  --   HCT  --   --  33.7*  --  30.8*  --  27.7*  --   PLT  --   --  141*  --  186  --  205  --   HEPARINUNFRC  --   --   --    < > 0.39  --  <0.10* 0.44  CREATININE  --  1.46*  --   --   --  2.29* 2.54*  --    < > = values in this interval not displayed.     Estimated Creatinine Clearance: 23.4 mL/min (A) (by C-G formula based on SCr of 2.54 mg/dL (H)).   Medical History: Past Medical History:  Diagnosis Date   Coronary artery disease    a. 07/2013 STEMI/PCI ([place]): LM nl, LAD 100p (thrombectomy & 3.5x23 Alpine DES), LCX nl, RCA nl, EF 45%.   Depression    Diastolic dysfunction    a.) TTE 11/26/2020 - LVEF 50-55%; G2DD.   Endometrial cancer ([place])    Gravida 3 para 3    Hyperlipidemia    Hypertension    Hypertensive heart disease    Hypothyroid    Ischemic cardiomyopathy    a.) 07/2013 EF 45% @ time of STEMI; b.) 11/2013 Echo: EF 55-60%, mildly dil LA, nl RV fxn. c.) TTE 07/29/2015 - LVEF 55-60%, mild LA and RV dilation. d.) TTE 11/26/2020 - LVEF 50-55%; G2DD.   Menopause    Obstructive sleep apnea on CPAP    Osteoarthritis    PONV (postoperative nausea and vomiting)    ST elevation myocardial infarction (STEMI) of anterolateral wall ([place]) 08/14/2013   a.) LVEF reduced at 45%; Tx'd with trombectomy and PCI to 100% LAD; 3.5 x 23 mm Xience Alpine DES placed.   T2DM (type 2 diabetes mellitus) ([place])    Thyroid cancer ([place])     a.) s/p total thyroidectomy. b.) on daily levothyroxine.    Medications:  Scheduled:   aspirin EC  81 mg Oral Daily   Chlorhexidine Gluconate Cloth  6 each Topical Q0600   ezetimibe  10 mg Oral Daily   FLUoxetine  10 mg Oral Daily   insulin aspart  0-5 Units Subcutaneous QHS   insulin aspart  0-9 Units Subcutaneous TID WC   levothyroxine  125 mcg Oral Q0600   polyethylene glycol  17 g Oral Daily   rosuvastatin  20 mg Oral Daily   sodium chloride flush  5 mL Intracatheter Q8H   Infusions:   sodium chloride 100 mL/hr at 02/01/22 0101   sodium chloride Stopped (01/31/22 2120)   heparin 1,550 Units/hr (02/01/22 0000)   norepinephrine (LEVOPHED) Adult infusion 14 mcg/min (02/01/22 0058)   piperacillin-tazobactam (ZOSYN)  IV 12.5 mL/hr at 02/01/22 0000   PRN: acetaminophen, ALPRAZolam, bisacodyl, HYDROmorphone (DILAUDID) injection, levalbuterol, nitroGLYCERIN, ondansetron (ZOFRAN) IV, ondansetron, [COMPLETED] senna-docusate **FOLLOWED BY** senna-docusate  Assessment: Grace Bucks  Simmons is a 76 y.o. female presenting with elevated troponins and D-Dimer. PMH significant for CAD (STEMI s/p stent 2015), HLD, HTN, DM, hypothyroidism (s/p thyroidectomy), depression, OSA on CPAP, endometrial cancer, dCHF, knee replacements (R-11/2020, L-11/2021), CKD-3 . Patient was not on [facility] PTA per chart review. [place] found acute occlusive DVT in the left posterior tibial vein at the mid calf. Pharmacy has been consulted to initiate and manage heparin infusion.   Labs for monitoring heparin infusion have been delayed on multiple occasions due to inability to draw sample.   Baseline Labs: aPTT 42, PT 14.2, INR 1.1, Hgb 9.6, Hct 30.1, Plt 118   Goal of Therapy:  Heparin level 0.3-0.7 units/ml Monitor platelets by anticoagulation protocol: Yes    Date Time HL Rate/Comment  11/29 2242 0.25 1050/SUBtherapeutic 11/30 0830 --- 0/paused for procedure 11/30 1141 --- 1200/resumed post procedure 11/30   2030    0.28      1200/SUBtherapeutic  12/1 1102 0.28 1350/SUBtherapeutic 12/01 2111 0.45 Therapeutic x 1 12/2 0716 0.39 Therapeutic x2 12/3  Heparin drip stopped at 0757 for procedure- now resuming ~1545 12/4 0028 0.44 Therapeutic x 1   Plan:  Continue heparin drip at 1550 units/hr Recheck HL in 8 hrs to confirm *completed NSTEMI tx, now continuing for DVT treatment CBC daily while on heparin infusion   Renda Rolls, PharmD, [facility] 02/01/2022 1:26 AM

## 2022-02-02 DIAGNOSIS — I214 Non-ST elevation (NSTEMI) myocardial infarction: Secondary | ICD-10-CM | POA: Diagnosis not present

## 2022-02-02 LAB — BASIC METABOLIC PANEL
Anion gap: 12 (ref 5–15)
BUN: 42 mg/dL — ABNORMAL HIGH (ref 8–23)
CO2: 14 mmol/L — ABNORMAL LOW (ref 22–32)
Calcium: 6.9 mg/dL — ABNORMAL LOW (ref 8.9–10.3)
Chloride: 102 mmol/L (ref 98–111)
Creatinine, Ser: 2.86 mg/dL — ABNORMAL HIGH (ref 0.44–1.00)
GFR, Estimated: 17 mL/min — ABNORMAL LOW (ref >60)
Glucose, Bld: 228 mg/dL — ABNORMAL HIGH (ref 70–99)
Potassium: 4.6 mmol/L (ref 3.5–5.1)
Sodium: 128 mmol/L — ABNORMAL LOW (ref 135–145)

## 2022-02-02 LAB — AEROBIC/ANAEROBIC CULTURE W GRAM STAIN (SURGICAL/DEEP WOUND)

## 2022-02-02 LAB — CBC
HCT: 26.6 % — ABNORMAL LOW (ref 36.0–46.0)
Hemoglobin: 8.4 g/dL — ABNORMAL LOW (ref 12.0–15.0)
MCH: 29.3 pg (ref 26.0–34.0)
MCHC: 31.6 g/dL (ref 30.0–36.0)
MCV: 92.7 fL (ref 80.0–100.0)
Platelets: 252 10*3/uL (ref 150–400)
RBC: 2.87 MIL/uL — ABNORMAL LOW (ref 3.87–5.11)
RDW: 15.2 % (ref 11.5–15.5)
WBC: 34.3 10*3/uL — ABNORMAL HIGH (ref 4.0–10.5)
nRBC: 0 % (ref 0.0–0.2)

## 2022-02-02 LAB — GLUCOSE, CAPILLARY
Glucose-Capillary: 160 mg/dL — ABNORMAL HIGH (ref 70–99)
Glucose-Capillary: 192 mg/dL — ABNORMAL HIGH (ref 70–99)
Glucose-Capillary: 238 mg/dL — ABNORMAL HIGH (ref 70–99)
Glucose-Capillary: 221 mg/dL — ABNORMAL HIGH (ref 70–99)

## 2022-02-02 LAB — HEPARIN LEVEL (UNFRACTIONATED): Heparin Unfractionated: 0.95 IU/mL — ABNORMAL HIGH (ref 0.30–0.70)

## 2022-02-02 LAB — PHOSPHORUS: Phosphorus: 6.9 mg/dL — ABNORMAL HIGH (ref 2.5–4.6)

## 2022-02-02 LAB — MAGNESIUM: Magnesium: 2 mg/dL (ref 1.7–2.4)

## 2022-02-02 MED ORDER — ADULT MULTIVITAMIN W/MINERALS CH
1.0000 | ORAL_TABLET | Freq: Every day | ORAL | Status: DC
  Administered 2022-02-03 – 2022-02-06 (×4): 1 via ORAL
  Filled 2022-02-02 (×4): qty 1

## 2022-02-02 MED ORDER — TRAMADOL HCL 50 MG PO TABS
50.0000 mg | ORAL_TABLET | Freq: Four times a day (QID) | ORAL | Status: DC | PRN
  Administered 2022-02-02 – 2022-02-06 (×6): 50 mg via ORAL
  Filled 2022-02-02 (×7): qty 1

## 2022-02-02 MED ORDER — APIXABAN 5 MG PO TABS
10.0000 mg | ORAL_TABLET | Freq: Two times a day (BID) | ORAL | Status: AC
  Administered 2022-02-02 – 2022-02-04 (×6): 10 mg via ORAL
  Filled 2022-02-02 (×6): qty 2

## 2022-02-02 MED ORDER — TRAMADOL HCL 50 MG PO TABS
50.0000 mg | ORAL_TABLET | Freq: Four times a day (QID) | ORAL | Status: DC
  Administered 2022-02-02: 50 mg via ORAL
  Filled 2022-02-02: qty 1

## 2022-02-02 MED ORDER — ENSURE MAX PROTEIN PO LIQD
11.0000 [oz_av] | Freq: Two times a day (BID) | ORAL | Status: DC
  Administered 2022-02-03 – 2022-02-06 (×4): 11 [oz_av] via ORAL
  Filled 2022-02-02: qty 330

## 2022-02-02 MED ORDER — APIXABAN 5 MG PO TABS
5.0000 mg | ORAL_TABLET | Freq: Two times a day (BID) | ORAL | Status: DC
  Administered 2022-02-05 – 2022-02-06 (×3): 5 mg via ORAL
  Filled 2022-02-02 (×3): qty 1

## 2022-02-02 MED ORDER — LACTATED RINGERS IV BOLUS
500.0000 mL | Freq: Once | INTRAVENOUS | Status: AC
  Administered 2022-02-02: 500 mL via INTRAVENOUS

## 2022-02-02 NOTE — Progress Notes (Addendum)
Good day. Slowly weaned from Levophed and Vasopressin as blood pressure allowed. Up to [facility] with help and a front wheel walker. Had medium size BM on BSC. Transferred from [facility] to recliner. Sat up for 2 hours. Transferred back to bed with help. Buttocks massaged with coconut oil as requested per patient. After back in bed given Tramadol for pain. Slept 1.5 hours after dose. Awaked for medications.

## 2022-02-02 NOTE — Progress Notes (Signed)
ANTICOAGULATION CONSULT NOTE  Pharmacy Consult for Heparin Infusion Indication: DVT  No Known Allergies  Patient Measurements: Height: '5\' 6"'$  (167.6 cm) Weight: 107.4 kg (236 lb 12.4 oz) IBW/kg (Calculated) : 59.3 Heparin Dosing Weight: 82.9 kg  Vital Signs: Temp: 97.8 F (36.6 C) (12/05 0400) Temp Source: Axillary (12/05 0400) BP: 140/46 (12/05 0545) Pulse Rate: 66 (12/05 0545)  Labs: Recent Labs    01/30/22 0716 01/30/22 1330 01/31/22 0508 02/01/22 0028 02/01/22 0420 02/01/22 0948 02/02/22 0500  HGB 9.7*  --  8.6*  --  8.4*  --   --   HCT 30.8*  --  27.7*  --  26.8*  --   --   PLT 186  --  205  --  266  --   --   HEPARINUNFRC 0.39  --  <0.10* 0.44  --  0.63 0.95*  CREATININE  --  2.29* 2.54*  --  2.66*  --   --      Estimated Creatinine Clearance: 22.3 mL/min (A) (by C-G formula based on SCr of 2.66 mg/dL (H)).   Medical History: Past Medical History:  Diagnosis Date   Coronary artery disease    a. 07/2013 [place] LM nl, LAD 100p (thrombectomy & 3.5x23 Alpine DES), LCX nl, RCA nl, EF 45%.   Depression    Diastolic dysfunction    a.) TTE 11/26/2020 - LVEF 50-55%; G2DD.   Endometrial cancer ([place])    Gravida 3 para 3    Hyperlipidemia    Hypertension    Hypertensive heart disease    Hypothyroid    Ischemic cardiomyopathy    a.) 07/2013 EF 45% @ time of STEMI; b.) 11/2013 Echo: EF 55-60%, mildly dil LA, nl RV fxn. c.) TTE 07/29/2015 - LVEF 55-60%, mild LA and RV dilation. d.) TTE 11/26/2020 - LVEF 50-55%; G2DD.   Menopause    Obstructive sleep apnea on CPAP    Osteoarthritis    PONV (postoperative nausea and vomiting)    ST elevation myocardial infarction (STEMI) of anterolateral wall ([place]) 08/14/2013   a.) LVEF reduced at 45%; Tx'd with trombectomy and PCI to 100% LAD; 3.5 x 23 mm Xience Alpine DES placed.   T2DM (type 2 diabetes mellitus) ([place])    Thyroid cancer ([place])    a.) s/p total thyroidectomy. b.) on daily levothyroxine.    Medications:   Scheduled:   aspirin EC  81 mg Oral Daily   Chlorhexidine Gluconate Cloth  6 each Topical Q0600   ezetimibe  10 mg Oral Daily   famotidine  20 mg Oral QHS   FLUoxetine  10 mg Oral Daily   hydrocortisone sod succinate (SOLU-CORTEF) inj  50 mg Intravenous Q6H   insulin aspart  0-5 Units Subcutaneous QHS   insulin aspart  0-9 Units Subcutaneous TID WC   levothyroxine  125 mcg Oral Q0600   midodrine  10 mg Oral TID WC   polyethylene glycol  17 g Oral Daily   rosuvastatin  20 mg Oral Daily   sodium bicarbonate  1,300 mg Oral BID   sodium chloride flush  5 mL Intracatheter Q8H   Infusions:   sodium chloride 100 mL/hr at 02/02/22 0515   sodium chloride Stopped (01/31/22 2120)   heparin 1,400 Units/hr (02/02/22 0551)   norepinephrine (LEVOPHED) Adult infusion 4 mcg/min (02/02/22 0500)   piperacillin-tazobactam (ZOSYN)  IV 3.375 g (02/02/22 0514)   vasopressin 0.03 Units/min (02/02/22 0500)   PRN: acetaminophen, ALPRAZolam, bisacodyl, HYDROmorphone (DILAUDID) injection, levalbuterol, nitroGLYCERIN, ondansetron (ZOFRAN) IV,  ondansetron  Assessment: [name] is a 76 y.o. female presenting with elevated troponins and D-Dimer. PMH significant for CAD (STEMI s/p stent 2015), HLD, HTN, DM, hypothyroidism (s/p thyroidectomy), depression, OSA on CPAP, endometrial cancer, dCHF, knee replacements (R-11/2020, L-11/2021), CKD-3 . Patient was not on [facility] PTA per chart review. [place] found acute occlusive DVT in the left posterior tibial vein at the mid calf. Pharmacy has been consulted to initiate and manage heparin infusion.   Labs for monitoring heparin infusion have been delayed on multiple occasions due to inability to draw sample.   Baseline Labs: aPTT 42, PT 14.2, INR 1.1, Hgb 9.6, Hct 30.1, Plt 118   Goal of Therapy:  Heparin level 0.3-0.7 units/ml Monitor platelets by anticoagulation protocol: Yes   Plan:  12/5:  HL @ 0500 = 0.95, elevated  Will decrease heparin drip to 1400 units/hr  and recheck HL 8 hrs after rate change.   Paul Trettin D 02/02/2022 5:52 AM

## 2022-02-02 NOTE — Progress Notes (Signed)
NAME:  [name], MRN:  [id number], DOB:  1945-10-09, LOS: 6 ADMISSION DATE:  01/27/2022, CONSULTATION DATE:  12/3 REFERRING MD:  Neomia Glass  REASON FOR CONSULT:  Hypotension   Brief HPI  76 y.o  female with significant PMH of CAD s/p STENT, HLD, DM, Hypothyroidism, OSA, Endometrial cancer, Diastolic CHF, CKD stage III, who presented to the ED with chief complaints of fever, nausea and vomiting found to have NSTEMI ,dilated CBD and acute cholecystitis with large gallstone in the neck of the gallbladder.    ED Course: Initial vital signs showed HR of 72 beats/minute, BP 80/48 mm Hg, the RR 30 breaths/minute, and the oxygen saturation 94% on RA and a temperature of 98.71F (36.9C).  Pertinent Labs/Diagnostics Findings: Chemistry:Na+/ K+: 132/3.2  Glucose:133  BUN/Cr.:45/1.32 CBC: WBC: 6.4 Hgb/Hct: 9.6/30.14 Plts:118 Other Lab findings:   PCT: 12.71  Lactic acid:1.0  COVID PCR: Negative, Troponin: 865 BNP: 38.4  Imaging:  CT findings as below noted for  portal vein thrombosis, dilated CBD and acute Left DVT.  Hospital Course: Patient admitted to stepdown unit under hospitalist service. Due to findings as above General surgery, GI and Cardiology was consulted. Patient was evaluated by cardiology who with recs to start heparin gtt x 48 hrs and General surgery with recs to obtain MRI and perc chole. Patient underwent image-guided percutaneous cholecystostomy tube placement. Patient developed worsening pain with increased WBC, and hypotension. Repeat CT showed abnormal position of the GB drain. IR consulted and drain replaced. Patient transferred to ICU for pressors. PCCM consulted.   Past Medical History    Coronary artery disease    a. 07/2013 STEMI/PCI ([place]): LM nl, LAD 100p (thrombectomy & 3.5x23 Alpine DES), LCX nl, RCA nl, EF 45%.   Depression   Diastolic dysfunction    a.) TTE 11/26/2020 - LVEF 50-55%; G2DD.   Endometrial cancer ([place])   Gravida 3 para 3   Hyperlipidemia    Hypertension   Hypertensive heart disease   Hypothyroid   Ischemic cardiomyopathy    a.) 07/2013 EF 45% @ time of STEMI; b.) 11/2013 Echo: EF 55-60%, mildly dil LA, nl RV fxn. c.) TTE 07/29/2015 - LVEF 55-60%, mild LA and RV dilation. d.) TTE 11/26/2020 - LVEF 50-55%; G2DD.   Menopause   Obstructive sleep apnea on CPAP   Osteoarthritis   PONV (postoperative nausea and vomiting)   ST elevation myocardial infarction (STEMI) of anterolateral wall (HCC)    a.) LVEF reduced at 45%; Tx'd with trombectomy and PCI to 100% LAD; 3.5 x 23 mm Xience Alpine DES placed.   T2DM (type 2 diabetes mellitus) ([place])   Thyroid cancer ([place])    a.) s/p total thyroidectomy. b.) on daily levothyroxine.   Significant Hospital Events   11/29: Admit to stepdown unit with Klebsiella sepsis secondary to acute cholecystitis/gallstones  11/30: Patient underwent image-guided percutaneous cholecystostomy tube placement.  Started on heparin drip for NSTEMI 12/2: Patient with worsening pain and increased white count.  Repeat CT abdomen showed abnormal positioning GB drain 12/3: S/p replacement of new 10 FR percutaneous cholecystectomy tube by IR.  Remained hypotensive transferred to ICU for pressors PCCM consulted 12/14 remains on pressors 12/5 remains on pressors  Consults:  Gastroenterology Cardiology IR General surgery PCCM  Procedures:  11/30: Percutaneous cholecystostomy drain placement 12/3:  Replacement of percutaneous cholecystostomy drain   Significant Diagnostic Tests:   12/29 US Abdomen RUQ>IMPRESSION: 1. 2.6 cm gallstone at the gallbladder neck with gallbladder wall thickening. No Murphy sign. Findings  worrisome for acute cholecystitis, despite the absence of a demonstrable Murphy sign. 2. Dilated common bile duct to 12 mm. No visible ductal stone.  12/29:MR Abdomen MRCP 1. Acute left portal vein thrombosis. No definite underlying mass is identified, although sensitivity is adversely affected by  motion artifact. 2. Cholelithiasis with gallbladder wall thickening and accentuated arterial phase hepatic parenchymal enhancement adjacent to the gallbladder which can be associated with acute cholecystitis. 3. Common bile duct 1.0 cm in diameter, without a definite CBD stone identified. 4. Atrophic pancreas. 5. Lumbar spondylosis and degenerative disc disease.  12/29: CTA abdomen and pelvis>  1. Non-enhancement of the left portal vein suspicious for portal vein thrombosis. Main portal vein and right portal veins enhance homogeneously. 2. Cholelithiasis with a 4.4 cm stone in the region of the gallbladder neck. Gallbladder is moderately distended with diffuse gallbladder wall thickening and mild intra and extrahepatic biliary ductal dilatation. Findings are concerning for acute cholecystitis. 3. Colonic diverticulosis without evidence of acute diverticulitis. 4. Aortic atherosclerosis (ICD10-I70.0).  12/29: CTA Chest> 1. No evidence of pulmonary embolism. 2. Trace layering bilateral pleural effusions with mild bibasilar subsegmental atelectasis. 3. Mildly dilated pulmonary trunk suggesting pulmonary arterial hypertension. 4. Aortic and coronary artery atherosclerosis.  11/29 :US DVT: Acute occlusive DVT in the left posterior tibial vein at the mid Calf. No DVT on the right.  Micro Data:  11/29 BCx: 1 of 4 bottles, Klebsiella pneumoniae (susceptibilities pending) 11/29 UCx: no growth final  11/30 Bile Cx: abundant GNR  Antimicrobials:  11/29 Cefepime, Metronidazole, Vancomycin x1 11/30 Zosyn >        SUBJECTIVE/EVENTS OVERNIGHT   Remains on pressors Remains septic shock On 2 vasopressors-coming down slowly +BACTEREMIA     OBJECTIVE  Blood pressure (!) 115/33, pulse (!) 54, temperature 97.8 F (36.6 C), temperature source Axillary, resp. rate 14, height '5\' 6"'$  (1.676 m), weight 107.4 kg, SpO2 100 %. CVP:  [10 mmHg-18 mmHg] 10 mmHg      Intake/Output Summary  (Last 24 hours) at 02/02/2022 7209 Last data filed at 02/02/2022 0700 Gross per 24 hour  Intake 6143.08 ml  Output 880 ml  Net 5263.08 ml    Filed Weights   01/31/22 0500 01/31/22 1205  Weight: 109.6 kg 107.4 kg        Review of Systems: Gen:  Denies  fever, sweats, chills weight loss  HEENT: Denies blurred vision, double vision, ear pain, eye pain, hearing loss, nose bleeds, sore throat Cardiac:  No dizziness, chest pain or heaviness, chest tightness,edema, No JVD Resp:   No cough, -sputum production, -shortness of breath,-wheezing, -hemoptysis,  Other:  All other systems negative    Physical Examination:   General Appearance: No distress  EYES PERRLA, EOM intact.   NECK Supple, No JVD Pulmonary: normal breath sounds, No wheezing.  CardiovascularNormal S1,S2.  No m/r/g.   Abdomen: Benign, Soft, non-tender.right sided GB drain+ ALL OTHER ROS ARE NEGATIVE        Labs/imaging that I havepersonally reviewed  (right click and "Reselect all SmartList Selections" daily)     Labs   CBC: Recent Labs  Lab 01/27/22 0942 01/28/22 0322 01/29/22 0822 01/30/22 0716 01/31/22 0508 02/01/22 0420 02/02/22 0500  WBC 6.4   < > 25.5* 44.6* 47.4* 39.5* 34.3*  NEUTROABS 5.4  --   --   --   --   --   --   HGB 9.6*   < > 10.7* 9.7* 8.6* 8.4* 8.4*  HCT 30.1*   < > 33.7*  30.8* 27.7* 26.8* 26.6*  MCV 93.5   < > 92.8 93.3 95.2 92.7 92.7  PLT 118*   < > 141* 186 205 266 252   < > = values in this interval not displayed.     Basic Metabolic Panel: Recent Labs  Lab 01/29/22 0805 01/30/22 1330 01/31/22 0508 02/01/22 0420 02/02/22 0500  NA 129* 128* 131* 127* 128*  K 4.3 3.9 4.1 3.9 4.6  CL 101 101 106 103 102  CO2 15* 15* 12* 13* 14*  GLUCOSE 213* 153* 125* 217* 228*  BUN 36* 42* 42* 43* 42*  CREATININE 1.46* 2.29* 2.54* 2.66* 2.86*  CALCIUM 7.7* 7.3* 7.1* 6.7* 6.9*  MG  --   --   --   --  2.0  PHOS  --   --   --   --  6.9*    GFR: Estimated Creatinine Clearance:  20.7 mL/min (A) (by C-G formula based on SCr of 2.86 mg/dL (H)). Recent Labs  Lab 01/27/22 0942 01/27/22 1048 01/27/22 1308 01/28/22 0322 01/29/22 0822 01/30/22 0716 01/31/22 0508 02/01/22 0420 02/02/22 0500  PROCALCITON 12.71  --   --  24.58 9.14  --   --   --   --   WBC 6.4  --   --  16.4* 25.5* 44.6* 47.4* 39.5* 34.3*  LATICACIDVEN  --  1.0 0.9  --   --   --   --  1.0  --      Liver Function Tests: Recent Labs  Lab 01/27/22 0942 01/31/22 0508 02/01/22 0420  AST 35 19 21  ALT '20 11 11  '$ ALKPHOS 115 96 101  BILITOT 0.6 1.7* 1.5*  PROT 6.0* 5.1* 5.1*  ALBUMIN 2.3* 1.7* 1.6*   HbA1C: Hemoglobin A1C  Date/Time Value Ref Range Status  08/10/2018 12:00 AM 7.7  Final   Hgb A1c MFr Bld  Date/Time Value Ref Range Status  01/27/2022 02:50 PM 6.8 (H) 4.8 - 5.6 % Final    Comment:    (NOTE)         Prediabetes: 5.7 - 6.4         Diabetes: >6.4         Glycemic control for adults with diabetes: <7.0   10/01/2021 09:35 AM 6.6 (H) <5.7 % of total Hgb Final    Comment:    For someone without known diabetes, a hemoglobin A1c value of 6.5% or greater indicates that they may have  diabetes and this should be confirmed with a follow-up  test. . For someone with known diabetes, a value <7% indicates  that their diabetes is well controlled and a value  greater than or equal to 7% indicates suboptimal  control. A1c targets should be individualized based on  duration of diabetes, age, comorbid conditions, and  other considerations. . Currently, no consensus exists regarding use of hemoglobin A1c for diagnosis of diabetes for children. .     CBG: Recent Labs  Lab 01/31/22 2121 02/01/22 0722 02/01/22 1237 02/01/22 1739 02/01/22 2212  GLUCAP 139* 188* 175* 158* 177*     Review of Systems:     Past Medical History  She,  has a past medical history of Coronary artery disease, Depression, Diastolic dysfunction, Endometrial cancer ([place]), Gravida 3 para 3,  Hyperlipidemia, Hypertension, Hypertensive heart disease, Hypothyroid, Ischemic cardiomyopathy, Menopause, Obstructive sleep apnea on CPAP, Osteoarthritis, PONV (postoperative nausea and vomiting), ST elevation myocardial infarction (STEMI) of anterolateral wall ([place]) (08/14/2013), T2DM (type 2 diabetes mellitus) ([place]), and Thyroid  cancer ([place]).   Surgical History    Past Surgical History:  Procedure Laterality Date   CATARACT EXTRACTION W/PHACO Left 08/30/2016   Procedure: CATARACT EXTRACTION PHACO AND INTRAOCULAR LENS PLACEMENT ([place]) Left daibetic;  Surgeon: Leandrew Koyanagi, MD;  Location: [facility];  Service: Ophthalmology;  Laterality: Left;  Diabetic - insulin and oral meds sleep apnea   CATARACT EXTRACTION W/PHACO Right 10/07/2016   Procedure: CATARACT EXTRACTION PHACO AND INTRAOCULAR LENS PLACEMENT ([facility]);  Surgeon: Leandrew Koyanagi, MD;  Location: [facility];  Service: Ophthalmology;  Laterality: Right;  Lot# [id number] H [place]: 00:43.3 AP%: 12.2 CDE: 5.28   CORONARY ANGIOPLASTY WITH STENT PLACEMENT Left 08/14/2013   Procedure: CORONARY ANGIOPLASTY WITH STENT PLACEMENT (3.5 x 23 mm Xience Alpine DES to LAD); Location: [facility], [place]; Surgeon: Algernon Huxley, MD   [facility] CYST EXCISION Right 1980   IR PERC CHOLECYSTOSTOMY  01/28/2022   IR PERC CHOLECYSTOSTOMY  01/31/2022   LAPAROSCOPIC HYSTERECTOMY     THYROIDECTOMY  1995   total   TOTAL KNEE ARTHROPLASTY Right 12/18/2020   Procedure: RIGHT TOTAL KNEE ARTHROPLASTY;  Surgeon: Thornton Park, MD;  Location: [facility];  Service: Orthopedics;  Laterality: Right;   TOTAL KNEE ARTHROPLASTY Left 12/10/2021   Procedure: TOTAL KNEE ARTHROPLASTY;  Surgeon: Thornton Park, MD;  Location: [facility];  Service: Orthopedics;  Laterality: Left;     Social History   reports that she has never smoked. She has never used smokeless tobacco. She reports that she does not drink alcohol and does not use drugs.   Family  History   Her family history includes Heart attack in her father; Heart disease (age of onset: 20) in her father; Heart failure in her father; Hyperlipidemia in her father; Hypertension in her father; Pulmonary fibrosis in her son. There is no history of Breast cancer.   Allergies No Known Allergies   Home Medications  Prior to Admission medications   Medication Sig Start Date End Date Taking? Authorizing Provider  acetaminophen (TYLENOL) 650 MG CR tablet Take 1,950 mg by mouth daily.   Yes [provider]  amLODipine (NORVASC) 2.5 MG tablet TAKE 1 TABLET BY MOUTH DAILY 06/16/21  Yes Gollan, Kathlene November, MD  aspirin EC (ASPIRIN 81) 81 MG tablet Take 81 mg by mouth daily.   Yes [provider]  bisacodyl (DULCOLAX) 5 MG EC tablet Take 2 tablets (10 mg total) by mouth daily as needed for moderate constipation. 12/11/21  Yes Thornton Park, MD  carvedilol (COREG) 3.125 MG tablet Take 3.125 mg by mouth daily.   Yes [provider]  docusate sodium (COLACE) 100 MG capsule Take 1 capsule (100 mg total) by mouth 2 (two) times daily. 12/11/21  Yes Thornton Park, MD  ezetimibe (ZETIA) 10 MG tablet TAKE 1 TABLET BY MOUTH DAILY 07/02/21  Yes Gollan, Kathlene November, MD  FLUoxetine (PROZAC) 10 MG capsule TAKE 1 CAPSULE BY MOUTH DAILY 12/07/21  Yes Karamalegos, Devonne Doughty, DO  fluticasone (FLONASE) 50 MCG/ACT nasal spray Place 2 sprays into both nostrils daily. Patient taking differently: Place 2-3 sprays into both nostrils at bedtime as needed. 02/18/21  Yes Brunetta Jeans, PA-C  levothyroxine (SYNTHROID) 125 MCG tablet TAKE 1 TABLET BY MOUTH  DAILY BEFORE BREAKFAST 09/29/21  Yes Karamalegos, Devonne Doughty, DO  losartan (COZAAR) 100 MG tablet TAKE 1 TABLET BY MOUTH DAILY 08/17/21  Yes Gollan, Kathlene November, MD  metFORMIN (GLUCOPHAGE-XR) 500 MG 24 hr tablet TAKE 3 TABLETS BY MOUTH DAILY  WITH BREAKFAST 01/08/22  Yes Karamalegos, Alexander J, DO  ondansetron (ZOFRAN-ODT) 4 MG disintegrating  tablet Take 4 mg by mouth every 8 (eight) hours as needed. 01/23/22  Yes [provider]  OZEMPIC, 1 MG/DOSE, 4 MG/3ML SOPN INJECT SUBCUTANEOUSLY 1 MG  WEEKLY 01/18/22  Yes Karamalegos, Alexander J, DO  rosuvastatin (CRESTOR) 20 MG tablet TAKE 1 TABLET BY MOUTH DAILY 07/28/21  Yes Gollan, Kathlene November, MD  traMADol (ULTRAM) 50 MG tablet Take 50 mg by mouth every 6 (six) hours as needed. 12/23/21  Yes [provider]  [place] test strip U UTD BID Patient not taking: Reported on 01/26/2022 11/10/16   Olin Hauser, DO  carvedilol (COREG) 3.125 MG tablet TAKE 1 TABLET BY MOUTH TWICE  DAILY Patient not taking: Reported on 01/27/2022 07/28/21   Minna Merritts, MD  fexofenadine (ALLEGRA) 180 MG tablet Take 180 mg by mouth daily as needed for allergies or rhinitis. Patient not taking: Reported on 11/30/2021    [provider]  nitroGLYCERIN (NITROSTAT) 0.4 MG SL tablet Place 1 tablet (0.4 mg total) under the tongue every 5 (five) minutes as needed for chest pain. 01/29/20   Minna Merritts, MD  oxyCODONE (OXY IR/ROXICODONE) 5 MG immediate release tablet Take 1-2 tablets (5-10 mg total) by mouth every 4 (four) hours as needed for moderate pain (pain score 4-6). Patient not taking: Reported on 01/26/2022 12/11/21   Thornton Park, MD    Scheduled Meds:  aspirin EC  81 mg Oral Daily   Chlorhexidine Gluconate Cloth  6 each Topical Q0600   ezetimibe  10 mg Oral Daily   famotidine  20 mg Oral QHS   FLUoxetine  10 mg Oral Daily   hydrocortisone sod succinate (SOLU-CORTEF) inj  50 mg Intravenous Q6H   insulin aspart  0-5 Units Subcutaneous QHS   insulin aspart  0-9 Units Subcutaneous TID WC   levothyroxine  125 mcg Oral Q0600   midodrine  10 mg Oral TID WC   polyethylene glycol  17 g Oral Daily   rosuvastatin  20 mg Oral Daily   sodium bicarbonate  1,300 mg Oral BID   sodium chloride flush  5 mL Intracatheter Q8H   Continuous Infusions:  sodium  chloride 100 mL/hr at 02/02/22 0700   sodium chloride Stopped (01/31/22 2120)   heparin 1,400 Units/hr (02/02/22 0700)   norepinephrine (LEVOPHED) Adult infusion 4 mcg/min (02/02/22 0700)   piperacillin-tazobactam (ZOSYN)  IV 12.5 mL/hr at 02/02/22 0700   vasopressin 0.03 Units/min (02/02/22 0700)   PRN Meds:.acetaminophen, ALPRAZolam, bisacodyl, HYDROmorphone (DILAUDID) injection, levalbuterol, nitroGLYCERIN, ondansetron (ZOFRAN) IV, ondansetron  Active Hospital Problem list     Assessment & Plan:   Severe Sepsis due to Klebsiella Bacteremia Acute Cholecystitis/Gallstone S/p Percutaneous cholecystostomy drain placement  on 11/30 revision on 12/3  SEPTIC shock -use vasopressors to keep MAP>65 as needed -follow ABG and LA as needed - stress dose steroids -aggressive IV fluid Resuscitation -Plan for outpatient cholecystectomy    NSTEMI Chronic Diastolic CHF PMHx: CAD s/p LAD PCI, NSTEMI, HLD, HTN, OSA on CPAP -Continuous cardiac monitoring -Continue Aspirin and Heparin gtt -Continue Rosuvastatin and Zetia -Cardiology following, appreciate input -CPAP at bedtime     ACUTE KIDNEY INJURY/Renal Failure -continue Foley Catheter-assess need -Avoid nephrotoxic agents -Follow urine output, BMP -Ensure adequate renal perfusion, optimize oxygenation -Renal dose medications   Intake/Output Summary (Last 24 hours) at 02/02/2022 0726 Last data filed at 02/02/2022 0700 Gross per 24 hour  Intake 6143.08 ml  Output 880 ml  Net 5263.08 ml       Latest Ref Rng & Units 02/02/2022    5:00 AM 02/01/2022    4:20 AM 01/31/2022    5:08 AM  BMP  Glucose 70 - 99 mg/dL 228  217  125   BUN 8 - 23 mg/dL 42  43  42   Creatinine 0.44 - 1.00 mg/dL 2.86  2.66  2.54   Sodium 135 - 145 mmol/L 128  127  131   Potassium 3.5 - 5.1 mmol/L 4.6  3.9  4.1   Chloride 98 - 111 mmol/L 102  103  106   CO2 22 - 32 mmol/L '14  13  12   '$ Calcium 8.9 - 10.3 mg/dL 6.9  6.7  7.1      Acute Left post tibial  vein DVT (deep venous thrombosis)  Portal Vein Thrombosis CTA negative for PE -Continue IV heparin -Plan ot switch to po DOAC pending cardiac w/u    ENDO - ICU hypoglycemic\Hyperglycemia protocol -check FSBS per protocol   GI GI PROPHYLAXIS as indicated  NUTRITIONAL STATUS DIET--> as tolerated Constipation protocol as indicated   ELECTROLYTES -follow labs as needed -replace as needed -pharmacy consultation and following      Best practice:  Diet:  Oral Pain/Anxiety/Delirium protocol (if indicated): No VAP protocol (if indicated): Not indicated DVT prophylaxis: Systemic AC GI prophylaxis: PPI Glucose control:  SSI Yes Central venous access:  Yes, and it is still needed Arterial line:  N/A Foley:  Yes, and it is still needed Mobility:  bed rest  PT consulted: N/A Last date of multidisciplinary goals of care discussion [12/3] Code Status:  full code Disposition: ICU     DVT/GI PRX  assessed I Assessed the need for Labs I Assessed the need for Foley I Assessed the need for Central Venous Line Family Discussion when available I Assessed the need for Mobilization I made an Assessment of medications to be adjusted accordingly Safety Risk assessment completed  CASE DISCUSSED IN MULTIDISCIPLINARY ROUNDS WITH ICU TEAM     Critical Care Time devoted to patient care services described in this note is 55 minutes.  Critical care was necessary to treat /prevent imminent and life-threatening deterioration. Overall, patient is critically ill, prognosis is guarded.  Patient with Multiorgan failure and at high risk for cardiac arrest and death.    Corrin Parker, M.D.  [facility]  Medical Director [place] Director [facility] Cardio-Pulmonary Department

## 2022-02-02 NOTE — Progress Notes (Signed)
[facility]  ROUNDING NOTE   Subjective:   Patient seen and evaluated at bedside in ICU Alert and oriented, room air Tolerating small meals Denies pain  Room air Pressors:vaso, Levo stopped  Heparin drip in place Lower extremity edema present  Objective:  Vital signs in last 24 hours:  Temp:  [97.8 F (36.6 C)-98.1 F (36.7 C)] 97.8 F (36.6 C) (12/05 0400) Pulse Rate:  [48-74] 54 (12/05 0700) Resp:  [9-22] 14 (12/05 0700) BP: (112-148)/(32-122) 115/33 (12/05 0700) SpO2:  [97 %-100 %] 100 % (12/05 0700)  Weight change:  Filed Weights   01/31/22 0500 01/31/22 1205  Weight: 109.6 kg 107.4 kg    Intake/Output: I/O last 3 completed shifts: In: 8892.5 [P.O.:340; I.V.:5652.4; Other:40; IV Piggyback:2860.1] Out: 1300 [Urine:650; Drains:650]   Intake/Output this shift:  No intake/output data recorded.  Physical Exam: General: NAD  Head: Normocephalic, atraumatic.  Dry oral mucosal membranes  Eyes: Anicteric  Lungs:  Clear to auscultation, normal effort  Heart: Regular rate and rhythm  Abdomen:  Soft, LLQ tender, drain in place  Extremities: Trace peripheral edema.  Neurologic: Nonfocal, moving all four extremities  Skin: No lesions  Access: None    Basic Metabolic Panel: Recent Labs  Lab 01/29/22 0805 01/30/22 1330 01/31/22 0508 02/01/22 0420 02/02/22 0500  NA 129* 128* 131* 127* 128*  K 4.3 3.9 4.1 3.9 4.6  CL 101 101 106 103 102  CO2 15* 15* 12* 13* 14*  GLUCOSE 213* 153* 125* 217* 228*  BUN 36* 42* 42* 43* 42*  CREATININE 1.46* 2.29* 2.54* 2.66* 2.86*  CALCIUM 7.7* 7.3* 7.1* 6.7* 6.9*  MG  --   --   --   --  2.0  PHOS  --   --   --   --  6.9*     Liver Function Tests: Recent Labs  Lab 01/27/22 0942 01/31/22 0508 02/01/22 0420  AST 35 19 21  ALT '20 11 11  '$ ALKPHOS 115 96 101  BILITOT 0.6 1.7* 1.5*  PROT 6.0* 5.1* 5.1*  ALBUMIN 2.3* 1.7* 1.6*    No results for input(s): "LIPASE", "AMYLASE" in the last 168 hours. No results  for input(s): "AMMONIA" in the last 168 hours.  CBC: Recent Labs  Lab 01/27/22 0942 01/28/22 0322 01/29/22 0822 01/30/22 0716 01/31/22 0508 02/01/22 0420 02/02/22 0500  WBC 6.4   < > 25.5* 44.6* 47.4* 39.5* 34.3*  NEUTROABS 5.4  --   --   --   --   --   --   HGB 9.6*   < > 10.7* 9.7* 8.6* 8.4* 8.4*  HCT 30.1*   < > 33.7* 30.8* 27.7* 26.8* 26.6*  MCV 93.5   < > 92.8 93.3 95.2 92.7 92.7  PLT 118*   < > 141* 186 205 266 252   < > = values in this interval not displayed.     Cardiac Enzymes: No results for input(s): "CKTOTAL", "CKMB", "CKMBINDEX", "TROPONINI" in the last 168 hours.  BNP: Invalid input(s): "POCBNP"  CBG: Recent Labs  Lab 02/01/22 0722 02/01/22 1237 02/01/22 1739 02/01/22 2212 02/02/22 0755  GLUCAP 188* 175* 158* 177* 160*     Microbiology: Results for orders placed or performed during the hospital encounter of 01/27/22  Blood culture (routine x 2)     Status: Abnormal   Collection Time: 01/27/22 10:48 AM   Specimen: BLOOD  Result Value Ref Range Status   Specimen Description   Final    BLOOD BLOOD LEFT ARM  Performed at [facility], [address] [place], [place] [address]    Special Requests   Final    BOTTLES DRAWN AEROBIC AND ANAEROBIC Blood Culture results may not be optimal due to an excessive volume of blood received in culture bottles Performed at [facility], [address] [place], [place] [address]    Culture  Setup Time   Final    GRAM NEGATIVE RODS AEROBIC BOTTLE ONLY CRITICAL RESULT CALLED TO, READ BACK BY AND VERIFIED WITH: Loura Back PHARMD 0800 01/28/22 HNM Performed at [facility], [place] [address] [place], [place] [address]    Culture KLEBSIELLA PNEUMONIAE (A)  Final   Report Status 01/30/2022 FINAL  Final   Organism ID, Bacteria KLEBSIELLA PNEUMONIAE  Final      Susceptibility   Klebsiella pneumoniae - MIC*    AMPICILLIN >=32 RESISTANT Resistant     CEFAZOLIN <=4 SENSITIVE Sensitive      CEFEPIME <=0.12 SENSITIVE Sensitive     CEFTAZIDIME <=1 SENSITIVE Sensitive     CEFTRIAXONE <=0.25 SENSITIVE Sensitive     CIPROFLOXACIN <=0.25 SENSITIVE Sensitive     GENTAMICIN <=1 SENSITIVE Sensitive     IMIPENEM <=0.25 SENSITIVE Sensitive     TRIMETH/SULFA <=20 SENSITIVE Sensitive     AMPICILLIN/SULBACTAM 4 SENSITIVE Sensitive     PIP/TAZO <=4 SENSITIVE Sensitive     * KLEBSIELLA PNEUMONIAE  Resp Panel by RT-PCR (Flu A&B, Covid) Urine, Clean Catch     Status: None   Collection Time: 01/27/22 10:48 AM   Specimen: Urine, Clean Catch; Nasal Swab  Result Value Ref Range Status   SARS Coronavirus 2 by RT PCR NEGATIVE NEGATIVE Final    Comment: (NOTE) SARS-CoV-2 target nucleic acids are NOT DETECTED.  The SARS-CoV-2 RNA is generally detectable in upper respiratory specimens during the acute phase of infection. The lowest concentration of SARS-CoV-2 viral copies this assay can detect is 138 copies/mL. A negative result does not preclude SARS-Cov-2 infection and should not be used as the sole basis for treatment or other patient management decisions. A negative result may occur with  improper specimen collection/handling, submission of specimen other than nasopharyngeal swab, presence of viral mutation(s) within the areas targeted by this assay, and inadequate number of viral copies(<138 copies/mL). A negative result must be combined with clinical observations, patient history, and epidemiological information. The expected result is Negative.  Fact Sheet for Patients:  EntrepreneurPulse.com.au  Fact Sheet for Healthcare Providers:  IncredibleEmployment.be  This test is no t yet approved or cleared by the [place] FDA and  has been authorized for detection and/or diagnosis of SARS-CoV-2 by FDA under an Emergency Use Authorization (EUA). This EUA will remain  in effect (meaning this test can be used) for the duration of the COVID-19  declaration under Section 564(b)(1) of the Act, 21 U.S.C.section 360bbb-3(b)(1), unless the authorization is terminated  or revoked sooner.       Influenza A by PCR NEGATIVE NEGATIVE Final   Influenza B by PCR NEGATIVE NEGATIVE Final    Comment: (NOTE) The Xpert Xpress SARS-CoV-2/FLU/RSV plus assay is intended as an aid in the diagnosis of influenza from Nasopharyngeal swab specimens and should not be used as a sole basis for treatment. Nasal washings and aspirates are unacceptable for Xpert Xpress SARS-CoV-2/FLU/RSV testing.  Fact Sheet for Patients: EntrepreneurPulse.com.au  Fact Sheet for Healthcare Providers: IncredibleEmployment.be  This test is not yet approved or cleared by the [place] FDA and has been authorized for detection and/or  diagnosis of SARS-CoV-2 by FDA under an Emergency Use Authorization (EUA). This EUA will remain in effect (meaning this test can be used) for the duration of the COVID-19 declaration under Section 564(b)(1) of the Act, 21 U.S.C. section 360bbb-3(b)(1), unless the authorization is terminated or revoked.  Performed at [facility], [address] [place], [place] [address]   Blood Culture ID Panel (Reflexed)     Status: Abnormal   Collection Time: 01/27/22 10:48 AM  Result Value Ref Range Status   Enterococcus faecalis NOT DETECTED NOT DETECTED Final   Enterococcus Faecium NOT DETECTED NOT DETECTED Final   Listeria monocytogenes NOT DETECTED NOT DETECTED Final   Staphylococcus species NOT DETECTED NOT DETECTED Final   Staphylococcus aureus (BCID) NOT DETECTED NOT DETECTED Final   Staphylococcus epidermidis NOT DETECTED NOT DETECTED Final   Staphylococcus lugdunensis NOT DETECTED NOT DETECTED Final   Streptococcus species NOT DETECTED NOT DETECTED Final   Streptococcus agalactiae NOT DETECTED NOT DETECTED Final   Streptococcus pneumoniae NOT DETECTED NOT DETECTED Final   Streptococcus  pyogenes NOT DETECTED NOT DETECTED Final   A.calcoaceticus-baumannii NOT DETECTED NOT DETECTED Final   Bacteroides fragilis NOT DETECTED NOT DETECTED Final   Enterobacterales DETECTED (A) NOT DETECTED Final    Comment: Enterobacterales represent a large order of gram negative bacteria, not a single organism. CRITICAL RESULT CALLED TO, READ BACK BY AND VERIFIED WITH: GREGORY GREENWOOD PHARMD 0800 01/28/22 HNM    Enterobacter cloacae complex NOT DETECTED NOT DETECTED Final   Escherichia coli NOT DETECTED NOT DETECTED Final   Klebsiella aerogenes NOT DETECTED NOT DETECTED Final   Klebsiella oxytoca NOT DETECTED NOT DETECTED Final   Klebsiella pneumoniae DETECTED (A) NOT DETECTED Final    Comment: CRITICAL RESULT CALLED TO, READ BACK BY AND VERIFIED WITH: GREGORY GREENWOOD PHARMD 0800 01/28/22 HNM    Proteus species NOT DETECTED NOT DETECTED Final   Salmonella species NOT DETECTED NOT DETECTED Final   Serratia marcescens NOT DETECTED NOT DETECTED Final   Haemophilus influenzae NOT DETECTED NOT DETECTED Final   Neisseria meningitidis NOT DETECTED NOT DETECTED Final   Pseudomonas aeruginosa NOT DETECTED NOT DETECTED Final   Stenotrophomonas maltophilia NOT DETECTED NOT DETECTED Final   Candida albicans NOT DETECTED NOT DETECTED Final   Candida auris NOT DETECTED NOT DETECTED Final   Candida glabrata NOT DETECTED NOT DETECTED Final   Candida krusei NOT DETECTED NOT DETECTED Final   Candida parapsilosis NOT DETECTED NOT DETECTED Final   Candida tropicalis NOT DETECTED NOT DETECTED Final   Cryptococcus neoformans/gattii NOT DETECTED NOT DETECTED Final   CTX-M ESBL NOT DETECTED NOT DETECTED Final   Carbapenem resistance IMP NOT DETECTED NOT DETECTED Final   Carbapenem resistance KPC NOT DETECTED NOT DETECTED Final   Carbapenem resistance NDM NOT DETECTED NOT DETECTED Final   Carbapenem resist OXA 48 LIKE NOT DETECTED NOT DETECTED Final   Carbapenem resistance VIM NOT DETECTED NOT DETECTED  Final    Comment: Performed at [facility], [address] [place], [place] [address]  Urine Culture     Status: None   Collection Time: 01/27/22 11:03 AM   Specimen: Urine, Clean Catch  Result Value Ref Range Status   Specimen Description   Final    URINE, CLEAN CATCH Performed at [facility], [address], [place], [place] [address]    Special Requests   Final    NONE Performed at [facility], [address], [place], [place] [address]    Culture  Final    NO GROWTH Performed at [facility], [place] [address] [place], [place] [address]    Report Status 01/28/2022 FINAL  Final  Blood culture (routine x 2)     Status: None   Collection Time: 01/27/22 11:30 AM   Specimen: BLOOD  Result Value Ref Range Status   Specimen Description BLOOD RIGHT ANTECUBITAL  Final   Special Requests   Final    BOTTLES DRAWN AEROBIC AND ANAEROBIC Blood Culture adequate volume   Culture   Final    NO GROWTH 5 DAYS Performed at [facility], [address] [place], [place] [address]    Report Status 02/01/2022 FINAL  Final  Aerobic/Anaerobic Culture w Gram Stain (surgical/deep wound)     Status: None (Preliminary result)   Collection Time: 01/28/22 10:23 AM   Specimen: Gallbladder; Bile  Result Value Ref Range Status   Specimen Description   Final    GALL BLADDER Performed at [facility], [address], [place], [place] [address]    Special Requests   Final    NONE Performed at [facility], [address], [place], [place] [address]    Gram Stain   Final    FEW WBC PRESENT, PREDOMINANTLY PMN FEW GRAM NEGATIVE RODS Performed at [facility], [place] [address], [place], [place] [address]    Culture   Final    ABUNDANT KLEBSIELLA PNEUMONIAE NO ANAEROBES ISOLATED; CULTURE IN PROGRESS FOR 5 DAYS    Report Status PENDING  Incomplete   Organism ID, Bacteria KLEBSIELLA PNEUMONIAE  Final      Susceptibility    Klebsiella pneumoniae - MIC*    AMPICILLIN >=32 RESISTANT Resistant     CEFAZOLIN <=4 SENSITIVE Sensitive     CEFEPIME <=0.12 SENSITIVE Sensitive     CEFTAZIDIME <=1 SENSITIVE Sensitive     CEFTRIAXONE <=0.25 SENSITIVE Sensitive     CIPROFLOXACIN <=0.25 SENSITIVE Sensitive     GENTAMICIN <=1 SENSITIVE Sensitive     IMIPENEM <=0.25 SENSITIVE Sensitive     TRIMETH/SULFA <=20 SENSITIVE Sensitive     AMPICILLIN/SULBACTAM 8 SENSITIVE Sensitive     PIP/TAZO <=4 SENSITIVE Sensitive     * ABUNDANT KLEBSIELLA PNEUMONIAE  MRSA Next Gen by PCR, Nasal     Status: None   Collection Time: 01/31/22 12:05 PM   Specimen: Nasal Mucosa; Nasal Swab  Result Value Ref Range Status   MRSA by PCR Next Gen NOT DETECTED NOT DETECTED Final    Comment: (NOTE) The GeneXpert MRSA Assay (FDA approved for NASAL specimens only), is one component of a comprehensive MRSA colonization surveillance program. It is not intended to diagnose MRSA infection nor to guide or monitor treatment for MRSA infections. Test performance is not FDA approved in patients less than 68 years old. Performed at [facility], [address] [place], [place] [address]     Coagulation Studies: No results for input(s): "LABPROT", "INR" in the last 72 hours.  Urinalysis: No results for input(s): "COLORURINE", "LABSPEC", "PHURINE", "GLUCOSEU", "HGBUR", "BILIRUBINUR", "KETONESUR", "PROTEINUR", "UROBILINOGEN", "NITRITE", "LEUKOCYTESUR" in the last 72 hours.  Invalid input(s): "APPERANCEUR"    Imaging: DG Chest Port 1 View  Result Date: 02/01/2022 CLINICAL DATA:  Central line placement EXAM: PORTABLE CHEST 1 VIEW COMPARISON:  01/27/2022 FINDINGS: Right central line tip in the upper right atrium. No pneumothorax. Low lung volumes with bibasilar airspace opacities, new since prior study. Heart is normal size. No visible effusion. No acute bony abnormality. IMPRESSION: Right central  line tip in the upper right atrium.  No  pneumothorax. Bilateral lower lobe airspace opacities. This could reflect atelectasis or pneumonia. Electronically Signed   By: Rolm Baptise M.D.   On: 02/01/2022 00:19     Medications:    sodium chloride 100 mL/hr at 02/02/22 0700   sodium chloride Stopped (01/31/22 2120)   heparin 1,400 Units/hr (02/02/22 0700)   norepinephrine (LEVOPHED) Adult infusion Stopped (02/02/22 0811)   piperacillin-tazobactam (ZOSYN)  IV 12.5 mL/hr at 02/02/22 0700   vasopressin 0.03 Units/min (02/02/22 0700)    aspirin EC  81 mg Oral Daily   Chlorhexidine Gluconate Cloth  6 each Topical Q0600   ezetimibe  10 mg Oral Daily   famotidine  20 mg Oral QHS   FLUoxetine  10 mg Oral Daily   hydrocortisone sod succinate (SOLU-CORTEF) inj  50 mg Intravenous Q6H   insulin aspart  0-5 Units Subcutaneous QHS   insulin aspart  0-9 Units Subcutaneous TID WC   levothyroxine  125 mcg Oral Q0600   midodrine  10 mg Oral TID WC   polyethylene glycol  17 g Oral Daily   rosuvastatin  20 mg Oral Daily   sodium bicarbonate  1,300 mg Oral BID   sodium chloride flush  5 mL Intracatheter Q8H   acetaminophen, ALPRAZolam, bisacodyl, HYDROmorphone (DILAUDID) injection, levalbuterol, nitroGLYCERIN, ondansetron (ZOFRAN) IV, ondansetron  Assessment/ Plan:  [name] is a 77 y.o.  female  with medical problems of  DM-2, CAD, non-STEMI, hypertension, hyperlipidemia, hypothyroidism, chronic diastolic CHF, thrombocytopenia, major depressive disorder in remission, obstructive sleep apnea, portal vein thrombosis, obesity, acute left posterior tibial vein DVT,  was admitted on 01/27/2022 for Cholecystitis [K81.9] NSTEMI (non-ST elevated myocardial infarction) ([facility]) [I21.4] Fever, unspecified fever cause [R50.9]   Acute kidney injury, acute metabolic acidosis nongap, Sepsis Likely ATN due to multifactorial etiology from hypotension, Klebsiella sepsis, IV contrast exposure, IV Toradol (nonsteroidals) Serum mean trends are  worsening.  Baseline creatinine of 0.9, GFR greater than 60 from 12/11/2020.  Lab Results  Component Value Date   CREATININE 2.86 (H) 02/02/2022   CREATININE 2.66 (H) 02/01/2022   CREATININE 2.54 (H) 01/31/2022    Intake/Output Summary (Last 24 hours) at 02/02/2022 1055 Last data filed at 02/02/2022 0700 Gross per 24 hour  Intake 6133.08 ml  Output 880 ml  Net 5253.08 ml    Creatinine elevated today. UOP slowly improving, 427m in past 24 hours. No acute need for dialysis. Remains on Zosyn for sepsis. Remains on heparin drip. Gallbladder drain remains in place. Bicarb 14 today, continue oral supplementation. Sodium also slowly improving, 128. Continue IVF    LOS: [place]12/5/202310:55 AM

## 2022-02-03 DIAGNOSIS — N179 Acute kidney failure, unspecified: Secondary | ICD-10-CM | POA: Diagnosis not present

## 2022-02-03 DIAGNOSIS — D509 Iron deficiency anemia, unspecified: Secondary | ICD-10-CM

## 2022-02-03 DIAGNOSIS — A419 Sepsis, unspecified organism: Secondary | ICD-10-CM | POA: Diagnosis not present

## 2022-02-03 DIAGNOSIS — I5032 Chronic diastolic (congestive) heart failure: Secondary | ICD-10-CM

## 2022-02-03 DIAGNOSIS — I214 Non-ST elevation (NSTEMI) myocardial infarction: Secondary | ICD-10-CM | POA: Diagnosis not present

## 2022-02-03 DIAGNOSIS — R6521 Severe sepsis with septic shock: Secondary | ICD-10-CM

## 2022-02-03 DIAGNOSIS — I81 Portal vein thrombosis: Secondary | ICD-10-CM

## 2022-02-03 DIAGNOSIS — E871 Hypo-osmolality and hyponatremia: Secondary | ICD-10-CM

## 2022-02-03 DIAGNOSIS — D696 Thrombocytopenia, unspecified: Secondary | ICD-10-CM

## 2022-02-03 LAB — GLUCOSE, CAPILLARY
Glucose-Capillary: 218 mg/dL — ABNORMAL HIGH (ref 70–99)
Glucose-Capillary: 278 mg/dL — ABNORMAL HIGH (ref 70–99)
Glucose-Capillary: 258 mg/dL — ABNORMAL HIGH (ref 70–99)
Glucose-Capillary: 255 mg/dL — ABNORMAL HIGH (ref 70–99)

## 2022-02-03 LAB — CBC WITH DIFFERENTIAL/PLATELET
Abs Immature Granulocytes: 1.42 10*3/uL — ABNORMAL HIGH (ref 0.00–0.07)
Basophils Absolute: 0 10*3/uL (ref 0.0–0.1)
Basophils Relative: 0 %
Eosinophils Absolute: 0 10*3/uL (ref 0.0–0.5)
Eosinophils Relative: 0 %
HCT: 23.1 % — ABNORMAL LOW (ref 36.0–46.0)
Hemoglobin: 7.5 g/dL — ABNORMAL LOW (ref 12.0–15.0)
Immature Granulocytes: 6 %
Lymphocytes Relative: 6 %
Lymphs Abs: 1.5 10*3/uL (ref 0.7–4.0)
MCH: 29.5 pg (ref 26.0–34.0)
MCHC: 32.5 g/dL (ref 30.0–36.0)
MCV: 90.9 fL (ref 80.0–100.0)
Monocytes Absolute: 1.3 10*3/uL — ABNORMAL HIGH (ref 0.1–1.0)
Monocytes Relative: 6 %
Neutro Abs: 19.7 10*3/uL — ABNORMAL HIGH (ref 1.7–7.7)
Neutrophils Relative %: 82 %
Platelets: 238 10*3/uL (ref 150–400)
RBC: 2.54 MIL/uL — ABNORMAL LOW (ref 3.87–5.11)
RDW: 15.4 % (ref 11.5–15.5)
Smear Review: NORMAL
WBC: 24 10*3/uL — ABNORMAL HIGH (ref 4.0–10.5)
nRBC: 0 % (ref 0.0–0.2)

## 2022-02-03 LAB — BASIC METABOLIC PANEL
Anion gap: 6 (ref 5–15)
BUN: 48 mg/dL — ABNORMAL HIGH (ref 8–23)
CO2: 17 mmol/L — ABNORMAL LOW (ref 22–32)
Calcium: 6.6 mg/dL — ABNORMAL LOW (ref 8.9–10.3)
Chloride: 105 mmol/L (ref 98–111)
Creatinine, Ser: 3.16 mg/dL — ABNORMAL HIGH (ref 0.44–1.00)
GFR, Estimated: 15 mL/min — ABNORMAL LOW (ref >60)
Glucose, Bld: 227 mg/dL — ABNORMAL HIGH (ref 70–99)
Potassium: 3.9 mmol/L (ref 3.5–5.1)
Sodium: 128 mmol/L — ABNORMAL LOW (ref 135–145)

## 2022-02-03 LAB — MAGNESIUM: Magnesium: 2 mg/dL (ref 1.7–2.4)

## 2022-02-03 LAB — PHOSPHORUS: Phosphorus: 6.4 mg/dL — ABNORMAL HIGH (ref 2.5–4.6)

## 2022-02-03 MED ORDER — CALCIUM CARBONATE ANTACID 500 MG PO CHEW
1.0000 | CHEWABLE_TABLET | Freq: Two times a day (BID) | ORAL | Status: DC
  Administered 2022-02-03 – 2022-02-04 (×3): 200 mg via ORAL
  Filled 2022-02-03 (×3): qty 1

## 2022-02-03 MED ORDER — FLUCONAZOLE 50 MG PO TABS
150.0000 mg | ORAL_TABLET | Freq: Once | ORAL | Status: AC
  Administered 2022-02-03: 150 mg via ORAL
  Filled 2022-02-03: qty 1

## 2022-02-03 MED ORDER — INSULIN GLARGINE-YFGN 100 UNIT/ML ~~LOC~~ SOLN
5.0000 [IU] | Freq: Every day | SUBCUTANEOUS | Status: DC
  Administered 2022-02-03 – 2022-02-05 (×3): 5 [IU] via SUBCUTANEOUS
  Filled 2022-02-03 (×4): qty 0.05

## 2022-02-03 MED ORDER — HYDROCORTISONE SOD SUC (PF) 100 MG IJ SOLR
50.0000 mg | Freq: Two times a day (BID) | INTRAMUSCULAR | Status: DC
  Administered 2022-02-03: 50 mg via INTRAVENOUS
  Filled 2022-02-03 (×2): qty 1

## 2022-02-03 MED ORDER — PIPERACILLIN-TAZOBACTAM IN DEX 2-0.25 GM/50ML IV SOLN
2.2500 g | Freq: Three times a day (TID) | INTRAVENOUS | Status: DC
  Administered 2022-02-03 – 2022-02-04 (×3): 2.25 g via INTRAVENOUS
  Filled 2022-02-03 (×4): qty 50

## 2022-02-03 MED ORDER — INSULIN ASPART 100 UNIT/ML IJ SOLN
2.0000 [IU] | Freq: Three times a day (TID) | INTRAMUSCULAR | Status: DC
  Administered 2022-02-03 – 2022-02-06 (×9): 2 [IU] via SUBCUTANEOUS
  Filled 2022-02-03 (×9): qty 1

## 2022-02-03 NOTE — Assessment & Plan Note (Addendum)
Discontinue fluids.  No signs of heart failure currently.  Last EF 60%.  Will give a dose of albumin.

## 2022-02-03 NOTE — Progress Notes (Signed)
PHARMACY NOTE:  ANTIMICROBIAL RENAL DOSAGE ADJUSTMENT  Current antimicrobial regimen includes a mismatch between antimicrobial dosage and estimated renal function.  As per policy approved by the Pharmacy & Therapeutics and Medical Executive Committees, the antimicrobial dosage will be adjusted accordingly.  Current antimicrobial dosage:  Zosyn 3.375 grams IV every 8 hours (EI)  Indication: bacteremia  Renal Function:  Estimated Creatinine Clearance: 18.8 mL/min (A) (by C-G formula based on SCr of 3.16 mg/dL (H)).    Antimicrobial dosage has been changed to:  Zosyn 2.25 grams IV every 8 hours  Thank you for allowing pharmacy to be a part of this patient's care.  Dallie Piles, [facility] 02/03/2022 7:44 AM

## 2022-02-03 NOTE — Assessment & Plan Note (Signed)
Resolved.  Platelet count normal range.

## 2022-02-03 NOTE — Assessment & Plan Note (Signed)
On Eliquis

## 2022-02-03 NOTE — Hospital Course (Addendum)
76 y.o  female with significant PMH of CAD s/p STENT, HLD, DM, Hypothyroidism, OSA, Endometrial cancer, Diastolic CHF, CKD stage III, who presented to the ED with chief complaints of fever, nausea and vomiting found to have NSTEMI ,dilated CBD and acute cholecystitis with large gallstone in the neck of the gallbladder.    ED Course: Initial vital signs showed HR of 72 beats/minute, BP 80/48 mm Hg, the RR 30 breaths/minute, and the oxygen saturation 94% on RA and a temperature of 98.73F (36.9C).  Pertinent Labs/Diagnostics Findings: Chemistry:Na+/ K+: 132/3.2  Glucose:133  BUN/Cr.:45/1.32 CBC: WBC: 6.4 Hgb/Hct: 9.6/30.14 Plts:118 Other Lab findings:   PCT: 12.71  Lactic acid:1.0  COVID PCR: Negative, Troponin: 865 BNP: 38.4  Imaging:  CT findings as below noted for  portal vein thrombosis, dilated CBD and acute Left DVT.   Hospital Course: Patient admitted to stepdown unit under hospitalist service. Due to findings as above General surgery, GI and Cardiology was consulted. Patient was evaluated by cardiology who with recs to start heparin gtt x 48 hrs and General surgery with recs to obtain MRI and perc chole. Patient underwent image-guided percutaneous cholecystostomy tube placement. Patient developed worsening pain with increased WBC, and hypotension. Repeat CT showed abnormal position of the GB drain. IR consulted and drain replaced. Patient transferred to ICU for pressors. PCCM consulted.  11/29: Admit to stepdown unit with Klebsiella sepsis secondary to acute cholecystitis/gallstones  11/30: Patient underwent image-guided percutaneous cholecystostomy tube placement.  Started on heparin drip for NSTEMI 12/2: Patient with worsening pain and increased white count.  Repeat CT abdomen showed abnormal positioning GB drain 12/3: S/p replacement of new 10 FR percutaneous cholecystectomy tube by IR.  Remained hypotensive transferred to ICU for pressors PCCM consulted 12/14 remains on pressors 12/5  remains on pressors 12/6 Off pressors 12/7 transferred out of the ICU 12/8 creatinine continues to rise at 3.41 patient was able to urinate last night and this morning.  With hemoglobin 7.5 will give a transfusion of 1 unit of packed red blood cells.  Change antibiotics from Zosyn over to Unasyn.  Discontinue central line. 12/9.  Creatinine improved to 2.99.  Patient urinating very well.  Hemoglobin up to 8.5 after transfusion.

## 2022-02-03 NOTE — Progress Notes (Signed)
Progress Note   Patient: [name] GNF:[id number] DOB: [birth year] DOA: 01/27/2022     7 DOS: the patient was seen and examined on 02/03/2022   Brief hospital course: 76 y.o  female with significant PMH of CAD s/p STENT, HLD, DM, Hypothyroidism, OSA, Endometrial cancer, Diastolic CHF, CKD stage III, who presented to the ED with chief complaints of fever, nausea and vomiting found to have NSTEMI ,dilated CBD and acute cholecystitis with large gallstone in the neck of the gallbladder.    ED Course: Initial vital signs showed HR of 72 beats/minute, BP 80/48 mm Hg, the RR 30 breaths/minute, and the oxygen saturation 94% on RA and a temperature of 98.70F (36.9C).  Pertinent Labs/Diagnostics Findings: Chemistry:Na+/ K+: 132/3.2  Glucose:133  BUN/Cr.:45/1.32 CBC: WBC: 6.4 Hgb/Hct: 9.6/30.14 Plts:118 Other Lab findings:   PCT: 12.71  Lactic acid:1.0  COVID PCR: Negative, Troponin: 865 BNP: 38.4  Imaging:  CT findings as below noted for  portal vein thrombosis, dilated CBD and acute Left DVT.   Hospital Course: Patient admitted to stepdown unit under hospitalist service. Due to findings as above General surgery, GI and Cardiology was consulted. Patient was evaluated by cardiology who with recs to start heparin gtt x 48 hrs and General surgery with recs to obtain MRI and perc chole. Patient underwent image-guided percutaneous cholecystostomy tube placement. Patient developed worsening pain with increased WBC, and hypotension. Repeat CT showed abnormal position of the GB drain. IR consulted and drain replaced. Patient transferred to ICU for pressors. PCCM consulted.  11/29: Admit to stepdown unit with Klebsiella sepsis secondary to acute cholecystitis/gallstones  11/30: Patient underwent image-guided percutaneous cholecystostomy tube placement.  Started on heparin drip for NSTEMI 12/2: Patient with worsening pain and increased white count.  Repeat CT abdomen showed abnormal positioning GB drain 12/3:  S/p replacement of new 10 FR percutaneous cholecystectomy tube by IR.  Remained hypotensive transferred to ICU for pressors PCCM consulted 12/14 remains on pressors 12/5 remains on pressors 12/6 Off pressors  Assessment and Plan: * Septic shock ([place]) Present on admission.  Klebsiella growing out of blood cultures.  Percutaneous gallbladder drain placement on 11/30 with revision on 12/3.  Gallbladder is the cause of the patient's sepsis.  Off pressors.  Continue midodrine.  Taper stress dose steroids.  Can transfer out of ICU today.  NSTEMI (non-ST elevated myocardial infarction) ([place]) Secondary to septic shock.  Continue aspirin.  Continue Crestor and Zetia  AKI (acute kidney injury) ([place]) Baseline creatinine 0.93.  Creatinine continues to rise at 3.16.  Gentle IV fluid hydration  Hyponatremia Decrease rate of IV fluids.  Advancing diet.  Hyperlipidemia associated with type 2 diabetes mellitus (HCC) Hemoglobin A1c 6.8.  Continue Crestor.  On sliding scale insulin.  Continue to hold metformin.  Chronic diastolic CHF (congestive heart failure) (HCC) Continue to monitor with IV fluids.  No signs of heart failure currently.  Last EF 60%.  Thrombocytopenia ([place]) Resolved.  Platelet count normal range.  Obstructive sleep apnea on CPAP CPAP at night  Obesity (BMI 30.0-34.9) BMI 38.22  Iron deficiency anemia Watch closely while on blood thinners.  Last hemoglobin 7.5.  Decrease IV fluids.  May end up needing blood during the hospital course.  DVT (deep venous thrombosis) ([place]) On Eliquis        Subjective: Patient feeling better.  Ate a little bit today.  No abdominal pain.  No nausea or vomiting.  Physical Exam: Vitals:   02/03/22 0400 02/03/22 0500 02/03/22 0600 02/03/22 0700  BP: Marland Kitchen)  107/41 (!) 107/41 (!) 114/46 (!) 126/40  Pulse: (!) 47 (!) 47 (!) 49 (!) 55  Resp: 16 (!) '9 14 20  '$ Temp: 97.8 F (36.6 C)     TempSrc: Oral     SpO2: 97% 98% 98% 100%  Weight:       Height:       Physical Exam HENT:     Head: Normocephalic.     Mouth/Throat:     Pharynx: No oropharyngeal exudate.  Eyes:     General: Lids are normal.     Conjunctiva/sclera: Conjunctivae normal.  Cardiovascular:     Rate and Rhythm: Normal rate and regular rhythm.     Heart sounds: Normal heart sounds, S1 normal and S2 normal.  Pulmonary:     Breath sounds: No decreased breath sounds, wheezing, rhonchi or rales.  Abdominal:     Palpations: Abdomen is soft.     Tenderness: There is no abdominal tenderness.  Musculoskeletal:     Right lower leg: Swelling present.     Left lower leg: Swelling present.  Skin:    General: Skin is warm.     Findings: No rash.  Neurological:     Mental Status: She is alert and oriented to person, place, and time.     Data Reviewed: Creatinine 3.16, sodium 128, white blood cell count 24, hemoglobin 7.5, platelet count 238  Family Communication: Spoke with Patient's daughter  Disposition: Status is: Inpatient Remains inpatient appropriate because: We will try to transfer out of the ICU today if blood pressure remains good.  Continue midodrine.  Taper stress dose steroids.  Still on IV antibiotics for septic shock.  Planned Discharge Destination: To be determined    Time spent: 28 minutes  Author: Loletha Grayer, MD 02/03/2022 12:02 PM  For on call review [email or web address].

## 2022-02-03 NOTE — Inpatient Diabetes Management (Signed)
Inpatient Diabetes Program Recommendations  AACE/ADA: New Consensus Statement on Inpatient Glycemic Control   Target Ranges:  Prepandial:   less than 140 mg/dL      Peak postprandial:   less than 180 mg/dL (1-2 hours)      Critically ill patients:  140 - 180 mg/dL    Latest Reference Range & Units 02/02/22 07:55 02/02/22 12:13 02/02/22 16:50 02/02/22 21:16 02/03/22 08:11  Glucose-Capillary 70 - 99 mg/dL 160 (H) 192 (H) 238 (H) 221 (H) 218 (H)   Review of Glycemic Control  Diabetes history: DM2 Outpatient Diabetes medications: Metformin XR 1500 mg QAM, Ozempic 1 mg Qweek Current orders for Inpatient glycemic control: Novolog 0-9 units TID with meals, Novolog 0-5 units QHS; Solucortef 50 mg Q12H  Inpatient Diabetes Program Recommendations:    Insulin: If steroids are continued as ordered, please consider ordering Semglee 5 units Q24H and Novolog 2 units TID with meals for meal coverage if patient eats at least 50% of meals. .  Thanks, Barnie Alderman, RN, MSN, [place] Diabetes Coordinator Inpatient Diabetes Program [phone] (Team Pager from 8am to [place])

## 2022-02-03 NOTE — Assessment & Plan Note (Addendum)
Hemoglobin A1c 6.8.  Continue Crestor.  On sliding scale insulin.  Continue to hold metformin.  Hold Ozempic upon discharge.  Will try low-dose Tradjenta.

## 2022-02-03 NOTE — Assessment & Plan Note (Signed)
-   CPAP at night °

## 2022-02-03 NOTE — Assessment & Plan Note (Addendum)
Watch closely while on blood thinners.  Last hemoglobin 8.7.  Received a blood transfusion on 12/7.

## 2022-02-03 NOTE — Assessment & Plan Note (Addendum)
Sodium 136 today.

## 2022-02-03 NOTE — Assessment & Plan Note (Addendum)
Present on admission.  Klebsiella growing out of blood cultures.  Percutaneous gallbladder drain placement on 11/30 with revision on 12/3.  Acute cholecystitis and gallbladder is the cause of the patient's sepsis.  Off pressors since 12/7.  Discontinued stress dose steroids on 12/7.  Discontinued midodrine on 12/8.  Patient was on Zosyn most of the hospital course and switch over to Unasyn.  Will switch to Augmentin and complete total of 14 days of antibiotics.

## 2022-02-03 NOTE — Progress Notes (Addendum)
[facility]  ROUNDING NOTE   Subjective:   Patient seen and evaluated at bedside in ICU Alert and oriented, room air Tolerating small meals Denies pain  Remains on room air Pressors off Heparin drip stopped, on Eliquis Lower extremity edema remains  Objective:  Vital signs in last 24 hours:  Temp:  [97.8 F (36.6 C)-98.3 F (36.8 C)] 97.8 F (36.6 C) (12/06 0400) Pulse Rate:  [47-69] 55 (12/06 0700) Resp:  [9-22] 20 (12/06 0700) BP: (96-126)/(30-57) 126/40 (12/06 0700) SpO2:  [96 %-100 %] 100 % (12/06 0700) FiO2 (%):  [21 %] 21 % (12/06 0332)  Weight change:  Filed Weights   01/31/22 0500 01/31/22 1205  Weight: 109.6 kg 107.4 kg    Intake/Output: I/O last 3 completed shifts: In: 8864.7 [P.O.:740; I.V.:5248.2; Other:20; IV Piggyback:2856.5] Out: 2505 [Urine:1250; Drains:255; Other:150]   Intake/Output this shift:  No intake/output data recorded.  Physical Exam: General: NAD  Head: Normocephalic, atraumatic.  Dry oral mucosal membranes  Eyes: Anicteric  Lungs:  Clear to auscultation, normal effort  Heart: Regular rate and rhythm  Abdomen:  Soft, LLQ tender, drain in place  Extremities: Trace peripheral edema.  Neurologic: Nonfocal, moving all four extremities  Skin: No lesions  Access: None    Basic Metabolic Panel: Recent Labs  Lab 01/30/22 1330 01/31/22 0508 02/01/22 0420 02/02/22 0500 02/03/22 0430  NA 128* 131* 127* 128* 128*  K 3.9 4.1 3.9 4.6 3.9  CL 101 106 103 102 105  CO2 15* 12* 13* 14* 17*  GLUCOSE 153* 125* 217* 228* 227*  BUN 42* 42* 43* 42* 48*  CREATININE 2.29* 2.54* 2.66* 2.86* 3.16*  CALCIUM 7.3* 7.1* 6.7* 6.9* 6.6*  MG  --   --   --  2.0 2.0  PHOS  --   --   --  6.9* 6.4*     Liver Function Tests: Recent Labs  Lab 01/31/22 0508 02/01/22 0420  AST 19 21  ALT 11 11  ALKPHOS 96 101  BILITOT 1.7* 1.5*  PROT 5.1* 5.1*  ALBUMIN 1.7* 1.6*    No results for input(s): "LIPASE", "AMYLASE" in the last 168  hours. No results for input(s): "AMMONIA" in the last 168 hours.  CBC: Recent Labs  Lab 01/30/22 0716 01/31/22 0508 02/01/22 0420 02/02/22 0500 02/03/22 0430  WBC 44.6* 47.4* 39.5* 34.3* 24.0*  NEUTROABS  --   --   --   --  19.7*  HGB 9.7* 8.6* 8.4* 8.4* 7.5*  HCT 30.8* 27.7* 26.8* 26.6* 23.1*  MCV 93.3 95.2 92.7 92.7 90.9  PLT 186 205 266 252 238     Cardiac Enzymes: No results for input(s): "CKTOTAL", "CKMB", "CKMBINDEX", "TROPONINI" in the last 168 hours.  BNP: Invalid input(s): "POCBNP"  CBG: Recent Labs  Lab 02/02/22 0755 02/02/22 1213 02/02/22 1650 02/02/22 2116 02/03/22 0811  GLUCAP 160* 192* 238* 221* 218*     Microbiology: Results for orders placed or performed during the hospital encounter of 01/27/22  Blood culture (routine x 2)     Status: Abnormal   Collection Time: 01/27/22 10:48 AM   Specimen: BLOOD  Result Value Ref Range Status   Specimen Description   Final    BLOOD BLOOD LEFT ARM Performed at [facility], [address] [place], [place] [address]    Special Requests   Final    BOTTLES DRAWN AEROBIC AND ANAEROBIC Blood Culture results may not be optimal due to an excessive volume of blood received in culture bottles Performed  at [facility], [address], [place],  [address]    Culture  Setup Time   Final    GRAM NEGATIVE RODS AEROBIC BOTTLE ONLY CRITICAL RESULT CALLED TO, READ BACK BY AND VERIFIED WITH: Loura Back PHARMD 0800 01/28/22 HNM Performed at [facility], [place] [address], [place], [place] [address]    Culture KLEBSIELLA PNEUMONIAE (A)  Final   Report Status 01/30/2022 FINAL  Final   Organism ID, Bacteria KLEBSIELLA PNEUMONIAE  Final      Susceptibility   Klebsiella pneumoniae - MIC*    AMPICILLIN >=32 RESISTANT Resistant     CEFAZOLIN <=4 SENSITIVE Sensitive     CEFEPIME <=0.12 SENSITIVE Sensitive     CEFTAZIDIME <=1 SENSITIVE Sensitive     CEFTRIAXONE <=0.25 SENSITIVE Sensitive      CIPROFLOXACIN <=0.25 SENSITIVE Sensitive     GENTAMICIN <=1 SENSITIVE Sensitive     IMIPENEM <=0.25 SENSITIVE Sensitive     TRIMETH/SULFA <=20 SENSITIVE Sensitive     AMPICILLIN/SULBACTAM 4 SENSITIVE Sensitive     PIP/TAZO <=4 SENSITIVE Sensitive     * KLEBSIELLA PNEUMONIAE  Resp Panel by RT-PCR (Flu A&B, Covid) Urine, Clean Catch     Status: None   Collection Time: 01/27/22 10:48 AM   Specimen: Urine, Clean Catch; Nasal Swab  Result Value Ref Range Status   SARS Coronavirus 2 by RT PCR NEGATIVE NEGATIVE Final    Comment: (NOTE) SARS-CoV-2 target nucleic acids are NOT DETECTED.  The SARS-CoV-2 RNA is generally detectable in upper respiratory specimens during the acute phase of infection. The lowest concentration of SARS-CoV-2 viral copies this assay can detect is 138 copies/mL. A negative result does not preclude SARS-Cov-2 infection and should not be used as the sole basis for treatment or other patient management decisions. A negative result may occur with  improper specimen collection/handling, submission of specimen other than nasopharyngeal swab, presence of viral mutation(s) within the areas targeted by this assay, and inadequate number of viral copies(<138 copies/mL). A negative result must be combined with clinical observations, patient history, and epidemiological information. The expected result is Negative.  Fact Sheet for Patients:  EntrepreneurPulse.com.au  Fact Sheet for Healthcare Providers:  IncredibleEmployment.be  This test is no t yet approved or cleared by the [place] FDA and  has been authorized for detection and/or diagnosis of SARS-CoV-2 by FDA under an Emergency Use Authorization (EUA). This EUA will remain  in effect (meaning this test can be used) for the duration of the COVID-19 declaration under Section 564(b)(1) of the Act, 21 U.S.C.section 360bbb-3(b)(1), unless the authorization is terminated  or  revoked sooner.       Influenza A by PCR NEGATIVE NEGATIVE Final   Influenza B by PCR NEGATIVE NEGATIVE Final    Comment: (NOTE) The Xpert Xpress SARS-CoV-2/FLU/RSV plus assay is intended as an aid in the diagnosis of influenza from Nasopharyngeal swab specimens and should not be used as a sole basis for treatment. Nasal washings and aspirates are unacceptable for Xpert Xpress SARS-CoV-2/FLU/RSV testing.  Fact Sheet for Patients: EntrepreneurPulse.com.au  Fact Sheet for Healthcare Providers: IncredibleEmployment.be  This test is not yet approved or cleared by the [place] FDA and has been authorized for detection and/or diagnosis of SARS-CoV-2 by FDA under an Emergency Use Authorization (EUA). This EUA will remain in effect (meaning this test can be used) for the duration of the COVID-19 declaration under Section 564(b)(1) of the Act, 21 U.S.C. section 360bbb-3(b)(1), unless the authorization is terminated or revoked.  Performed at [facility], [address] [place], [place] [address]   Blood Culture ID Panel (Reflexed)     Status: Abnormal   Collection Time: 01/27/22 10:48 AM  Result Value Ref Range Status   Enterococcus faecalis NOT DETECTED NOT DETECTED Final   Enterococcus Faecium NOT DETECTED NOT DETECTED Final   Listeria monocytogenes NOT DETECTED NOT DETECTED Final   Staphylococcus species NOT DETECTED NOT DETECTED Final   Staphylococcus aureus (BCID) NOT DETECTED NOT DETECTED Final   Staphylococcus epidermidis NOT DETECTED NOT DETECTED Final   Staphylococcus lugdunensis NOT DETECTED NOT DETECTED Final   Streptococcus species NOT DETECTED NOT DETECTED Final   Streptococcus agalactiae NOT DETECTED NOT DETECTED Final   Streptococcus pneumoniae NOT DETECTED NOT DETECTED Final   Streptococcus pyogenes NOT DETECTED NOT DETECTED Final   A.calcoaceticus-baumannii NOT DETECTED NOT DETECTED Final   Bacteroides fragilis  NOT DETECTED NOT DETECTED Final   Enterobacterales DETECTED (A) NOT DETECTED Final    Comment: Enterobacterales represent a large order of gram negative bacteria, not a single organism. CRITICAL RESULT CALLED TO, READ BACK BY AND VERIFIED WITH: GREGORY GREENWOOD PHARMD 0800 01/28/22 HNM    Enterobacter cloacae complex NOT DETECTED NOT DETECTED Final   Escherichia coli NOT DETECTED NOT DETECTED Final   Klebsiella aerogenes NOT DETECTED NOT DETECTED Final   Klebsiella oxytoca NOT DETECTED NOT DETECTED Final   Klebsiella pneumoniae DETECTED (A) NOT DETECTED Final    Comment: CRITICAL RESULT CALLED TO, READ BACK BY AND VERIFIED WITH: GREGORY GREENWOOD PHARMD 0800 01/28/22 HNM    Proteus species NOT DETECTED NOT DETECTED Final   Salmonella species NOT DETECTED NOT DETECTED Final   Serratia marcescens NOT DETECTED NOT DETECTED Final   Haemophilus influenzae NOT DETECTED NOT DETECTED Final   Neisseria meningitidis NOT DETECTED NOT DETECTED Final   Pseudomonas aeruginosa NOT DETECTED NOT DETECTED Final   Stenotrophomonas maltophilia NOT DETECTED NOT DETECTED Final   Candida albicans NOT DETECTED NOT DETECTED Final   Candida auris NOT DETECTED NOT DETECTED Final   Candida glabrata NOT DETECTED NOT DETECTED Final   Candida krusei NOT DETECTED NOT DETECTED Final   Candida parapsilosis NOT DETECTED NOT DETECTED Final   Candida tropicalis NOT DETECTED NOT DETECTED Final   Cryptococcus neoformans/gattii NOT DETECTED NOT DETECTED Final   CTX-M ESBL NOT DETECTED NOT DETECTED Final   Carbapenem resistance IMP NOT DETECTED NOT DETECTED Final   Carbapenem resistance KPC NOT DETECTED NOT DETECTED Final   Carbapenem resistance NDM NOT DETECTED NOT DETECTED Final   Carbapenem resist OXA 48 LIKE NOT DETECTED NOT DETECTED Final   Carbapenem resistance VIM NOT DETECTED NOT DETECTED Final    Comment: Performed at [facility], [address] [place], [place] [address]  Urine Culture     Status:  None   Collection Time: 01/27/22 11:03 AM   Specimen: Urine, Clean Catch  Result Value Ref Range Status   Specimen Description   Final    URINE, CLEAN CATCH Performed at [facility], [address], [place], [place] [address]    Special Requests   Final    NONE Performed at [facility], [address], [place], [place] [address]    Culture   Final    NO GROWTH Performed at [facility], [address], [place], [place] [address]    Report Status 01/28/2022 FINAL  Final  Blood culture (routine x 2)     Status: None   Collection Time: 01/27/22 11:30 AM  Specimen: BLOOD  Result Value Ref Range Status   Specimen Description BLOOD RIGHT ANTECUBITAL  Final   Special Requests   Final    BOTTLES DRAWN AEROBIC AND ANAEROBIC Blood Culture adequate volume   Culture   Final    NO GROWTH 5 DAYS Performed at [facility], [address], [place], [place] [address]    Report Status 02/01/2022 FINAL  Final  Aerobic/Anaerobic Culture w Gram Stain (surgical/deep wound)     Status: None   Collection Time: 01/28/22 10:23 AM   Specimen: Gallbladder; Bile  Result Value Ref Range Status   Specimen Description   Final    GALL BLADDER Performed at [facility], [address], [place], [place] [address]    Special Requests   Final    NONE Performed at [facility], [address] [place], [place] [address]    Gram Stain   Final    FEW WBC PRESENT, PREDOMINANTLY PMN FEW GRAM NEGATIVE RODS    Culture   Final    ABUNDANT KLEBSIELLA PNEUMONIAE NO ANAEROBES ISOLATED Performed at [facility], [place] [address] [place], [place] [address]    Report Status 02/02/2022 FINAL  Final   Organism ID, Bacteria KLEBSIELLA PNEUMONIAE  Final      Susceptibility   Klebsiella pneumoniae - MIC*    AMPICILLIN >=32 RESISTANT Resistant     CEFAZOLIN <=4 SENSITIVE Sensitive     CEFEPIME <=0.12 SENSITIVE Sensitive     CEFTAZIDIME <=1 SENSITIVE  Sensitive     CEFTRIAXONE <=0.25 SENSITIVE Sensitive     CIPROFLOXACIN <=0.25 SENSITIVE Sensitive     GENTAMICIN <=1 SENSITIVE Sensitive     IMIPENEM <=0.25 SENSITIVE Sensitive     TRIMETH/SULFA <=20 SENSITIVE Sensitive     AMPICILLIN/SULBACTAM 8 SENSITIVE Sensitive     PIP/TAZO <=4 SENSITIVE Sensitive     * ABUNDANT KLEBSIELLA PNEUMONIAE  MRSA Next Gen by PCR, Nasal     Status: None   Collection Time: 01/31/22 12:05 PM   Specimen: Nasal Mucosa; Nasal Swab  Result Value Ref Range Status   MRSA by PCR Next Gen NOT DETECTED NOT DETECTED Final    Comment: (NOTE) The GeneXpert MRSA Assay (FDA approved for NASAL specimens only), is one component of a comprehensive MRSA colonization surveillance program. It is not intended to diagnose MRSA infection nor to guide or monitor treatment for MRSA infections. Test performance is not FDA approved in patients less than 10 years old. Performed at [facility], [address] [place],  [address]     Coagulation Studies: No results for input(s): "LABPROT", "INR" in the last 72 hours.  Urinalysis: No results for input(s): "COLORURINE", "LABSPEC", "PHURINE", "GLUCOSEU", "HGBUR", "BILIRUBINUR", "KETONESUR", "PROTEINUR", "UROBILINOGEN", "NITRITE", "LEUKOCYTESUR" in the last 72 hours.  Invalid input(s): "APPERANCEUR"    Imaging: No results found.   Medications:    sodium chloride 100 mL/hr at 02/03/22 0832   sodium chloride Stopped (01/31/22 2120)   piperacillin-tazobactam (ZOSYN)  IV      apixaban  10 mg Oral BID   Followed by   Derrill Memo ON 02/05/2022] apixaban  5 mg Oral BID   aspirin EC  81 mg Oral Daily   Chlorhexidine Gluconate Cloth  6 each Topical Q0600   ezetimibe  10 mg Oral Daily   famotidine  20 mg Oral QHS   FLUoxetine  10 mg Oral Daily   hydrocortisone sod succinate (SOLU-CORTEF) inj  50 mg Intravenous Q12H   insulin aspart  0-5  Units Subcutaneous QHS   insulin aspart  0-9 Units Subcutaneous TID WC    levothyroxine  125 mcg Oral Q0600   midodrine  10 mg Oral TID WC   multivitamin with minerals  1 tablet Oral Daily   polyethylene glycol  17 g Oral Daily   Ensure Max Protein  11 oz Oral BID   rosuvastatin  20 mg Oral Daily   sodium bicarbonate  1,300 mg Oral BID   sodium chloride flush  5 mL Intracatheter Q8H   acetaminophen, ALPRAZolam, bisacodyl, HYDROmorphone (DILAUDID) injection, levalbuterol, nitroGLYCERIN, ondansetron (ZOFRAN) IV, ondansetron, traMADol  Assessment/ Plan:  Ms. [name] is a 76 y.o.  female  with medical problems of  DM-2, CAD, non-STEMI, hypertension, hyperlipidemia, hypothyroidism, chronic diastolic CHF, thrombocytopenia, major depressive disorder in remission, obstructive sleep apnea, portal vein thrombosis, obesity, acute left posterior tibial vein DVT,  was admitted on 01/27/2022 for Cholecystitis [K81.9] NSTEMI (non-ST elevated myocardial infarction) ([place]) [I21.4] Fever, unspecified fever cause [R50.9]   Acute kidney injury, acute metabolic acidosis nongap, Sepsis Likely ATN due to multifactorial etiology from hypotension, Klebsiella sepsis, IV contrast exposure, IV Toradol (nonsteroidals) Serum mean trends are worsening.  Baseline creatinine of 0.9, GFR greater than 60 from 12/11/2020.  Lab Results  Component Value Date   CREATININE 3.16 (H) 02/03/2022   CREATININE 2.86 (H) 02/02/2022   CREATININE 2.66 (H) 02/01/2022    Intake/Output Summary (Last 24 hours) at 02/03/2022 1105 Last data filed at 02/03/2022 0700 Gross per 24 hour  Intake 2645.44 ml  Output 1260 ml  Net 1385.44 ml    Renal function continues to decline, however urine output increasing, 967m. No acute need for dialysis today. Antibiotics ordered for sepsis. Bicarb 17, continue oral supplementation. Transitioned to Eliquis. Sodium remains 128.  Will order calcium carbonate 500 mg twice daily for hypocalcemia.    LOS: 7 Terrell Ostrand 12/6/202311:05 AM

## 2022-02-03 NOTE — Progress Notes (Signed)
I am concern that this [patient is making much urine, she I&O cath last night and I she have not pee all day and I just in & out cath her again and got 400 amber cloudy /sedimented color urine, with thick white discharges. BUN is 48 and Creat 3.16   Pt started on Diflucan, Dr Candiss Norse and PA added on the communication note. Murlean Iba, MD and Colon Flattery NP concerning urine retention.  Reported to Primary RN getting her in room 148.  Pt transferred to room 148 with family at bedside.

## 2022-02-03 NOTE — Progress Notes (Signed)
Physical Therapy Evaluation Patient Details Name: [name] MRN: [id number] DOB: 02/19/1946 Today's Date: 02/03/2022  History of Present Illness  76 y.o  female with significant PMH of CAD s/p STENT, HLD, DM, Hypothyroidism, OSA, Endometrial cancer, Diastolic CHF, CKD stage III, who presented to the ED with chief complaints of fever, nausea and vomiting found to have NSTEMI ,dilated CBD and acute cholecystitis with large gallstone in the neck of the gallbladder.  Clinical Impression  Pt eager to work with PT and ultimately did very well.  She needed a little assist with bed mobility and standing, but was able to circumambulate the CCU with walker displaying consistent cadence and good overall confidence.  Pt is not at her baseline and will benefit from further PT to address functional limitations.       Recommendations for follow up therapy are one component of a multi-disciplinary discharge planning process, led by the attending physician.  Recommendations may be updated based on patient status, additional functional criteria and insurance authorization.  Follow Up Recommendations Home health PT      Assistance Recommended at Discharge Set up Supervision/Assistance  Patient can return home with the following  Assist for transportation;Assistance with cooking/housework    Equipment Recommendations None recommended by PT  Recommendations for Other Services       Functional Status Assessment Patient has had a recent decline in their functional status and demonstrates the ability to make significant improvements in function in a reasonable and predictable amount of time.     Precautions / Restrictions Precautions Precautions: Fall Restrictions Weight Bearing Restrictions: No      Mobility  Bed Mobility Overal bed mobility: Needs Assistance Bed Mobility: Supine to Sit     Supine to sit: Min assist     General bed mobility comments: light HHA to get trunk to upright but  able to get LEs off EOB w/o assist    Transfers Overall transfer level: Needs assistance Equipment used: Rolling walker (2 wheels) Transfers: Sit to/from Stand Sit to Stand: Min guard, Min assist, From elevated surface           General transfer comment: unable to rise from standard height bed, raised ~2" and gave minimal assist to insure sustained forward momentum    Ambulation/Gait Ambulation/Gait assistance: Supervision Gait Distance (Feet): 200 Feet Assistive device: Rolling walker (2 wheels)         General Gait Details: Pt able to ambulate with consistent cadence and good confidence. O2 remains in the high 90s on room air and HR generally in the 70-80s range  Stairs            Wheelchair Mobility    Modified Rankin (Stroke Patients Only)       Balance Overall balance assessment: Modified Independent                                           Pertinent Vitals/Pain Pain Assessment Pain Assessment: No/denies pain    Home Living Family/patient expects to be discharged to:: Private residence Living Arrangements: Alone Available Help at Discharge: Family Type of Home: House ("tiny home" across the driveway from family) Home Access: Stairs to enter Entrance Stairs-Rails: Left Entrance Stairs-Number of Steps: 3   Home Layout: One level Home Equipment: Conservation officer, nature (2 wheels);BSC/3in1;Grab bars - tub/shower;Grab bars - toilet      Prior Function Prior Level of Function :  Independent/Modified Independent             Mobility Comments: Pt lives alone, recovering well from TKA 8 weeks ago, has not been needing the walker ADLs Comments: Reports being able to manage well in the home     Hand Dominance        Extremity/Trunk Assessment   Upper Extremity Assessment Upper Extremity Assessment: Overall WFL for tasks assessed    Lower Extremity Assessment Lower Extremity Assessment: Overall WFL for tasks assessed (near TKE on L  (flexion >90))       Communication   Communication: No difficulties  Cognition Arousal/Alertness: Awake/alert Behavior During Therapy: WFL for tasks assessed/performed Overall Cognitive Status: Within Functional Limits for tasks assessed                                          General Comments      Exercises     Assessment/Plan    PT Assessment Patient needs continued PT services  PT Problem List Decreased strength;Decreased range of motion;Decreased activity tolerance;Decreased balance;Decreased mobility;Decreased knowledge of use of DME;Decreased safety awareness       PT Treatment Interventions Gait training;Stair training;Functional mobility training;Therapeutic activities;Balance training;Therapeutic exercise;Patient/family education    PT Goals (Current goals can be found in the Care Plan section)  Acute Rehab PT Goals Patient Stated Goal: go home PT Goal Formulation: With patient Time For Goal Achievement: 02/16/22 Potential to Achieve Goals: Good    Frequency Min 2X/week     Co-evaluation               AM-PAC PT "6 Clicks" Mobility  Outcome Measure Help needed turning from your back to your side while in a flat bed without using bedrails?: A Little Help needed moving from lying on your back to sitting on the side of a flat bed without using bedrails?: A Little Help needed moving to and from a bed to a chair (including a wheelchair)?: A Little Help needed standing up from a chair using your arms (e.g., wheelchair or bedside chair)?: A Little Help needed to walk in hospital room?: A Little Help needed climbing 3-5 steps with a railing? : A Little 6 Click Score: 18    End of Session Equipment Utilized During Treatment: Gait belt Activity Tolerance: Patient tolerated treatment well Patient left: in chair;with nursing/sitter in room;with family/visitor present Nurse Communication: Mobility status PT Visit Diagnosis: Muscle weakness  (generalized) (M62.81);Difficulty in walking, not elsewhere classified (R26.2)    Time: 5170-0174 PT Time Calculation (min) (ACUTE ONLY): 35 min   Charges:   PT Evaluation $PT Eval Moderate Complexity: 1 Mod PT Treatments $Gait Training: 8-22 mins        Kreg Shropshire, DPT 02/03/2022, 5:30 PM

## 2022-02-03 NOTE — Assessment & Plan Note (Addendum)
Baseline creatinine 0.93.  Creatinine peaked on 12/7 at 3.41.  Creatinine on 12/8 2.99.  Creatinine 2.13 on the day of discharge.  Nephrology would like to keep sodium bicarb going daily until follow-up appointment.

## 2022-02-03 NOTE — Assessment & Plan Note (Addendum)
BMI 38.22

## 2022-02-03 NOTE — Assessment & Plan Note (Addendum)
Secondary to septic shock.  Continue aspirin.  Continue Crestor and Zetia.  Will try half dose of Toprol-XL at night upon discharge since blood pressure and heart rate are better.

## 2022-02-04 DIAGNOSIS — A419 Sepsis, unspecified organism: Secondary | ICD-10-CM | POA: Diagnosis not present

## 2022-02-04 DIAGNOSIS — D509 Iron deficiency anemia, unspecified: Secondary | ICD-10-CM | POA: Diagnosis not present

## 2022-02-04 DIAGNOSIS — N179 Acute kidney failure, unspecified: Secondary | ICD-10-CM | POA: Diagnosis not present

## 2022-02-04 DIAGNOSIS — I214 Non-ST elevation (NSTEMI) myocardial infarction: Secondary | ICD-10-CM | POA: Diagnosis not present

## 2022-02-04 LAB — GLUCOSE, CAPILLARY
Glucose-Capillary: 214 mg/dL — ABNORMAL HIGH (ref 70–99)
Glucose-Capillary: 266 mg/dL — ABNORMAL HIGH (ref 70–99)
Glucose-Capillary: 276 mg/dL — ABNORMAL HIGH (ref 70–99)
Glucose-Capillary: 252 mg/dL — ABNORMAL HIGH (ref 70–99)

## 2022-02-04 LAB — BASIC METABOLIC PANEL
Anion gap: 8 (ref 5–15)
BUN: 50 mg/dL — ABNORMAL HIGH (ref 8–23)
CO2: 16 mmol/L — ABNORMAL LOW (ref 22–32)
Calcium: 6.9 mg/dL — ABNORMAL LOW (ref 8.9–10.3)
Chloride: 106 mmol/L (ref 98–111)
Creatinine, Ser: 3.41 mg/dL — ABNORMAL HIGH (ref 0.44–1.00)
GFR, Estimated: 13 mL/min — ABNORMAL LOW (ref >60)
Glucose, Bld: 232 mg/dL — ABNORMAL HIGH (ref 70–99)
Potassium: 3.8 mmol/L (ref 3.5–5.1)
Sodium: 130 mmol/L — ABNORMAL LOW (ref 135–145)

## 2022-02-04 LAB — HEMOGLOBIN: Hemoglobin: 7.5 g/dL — ABNORMAL LOW (ref 12.0–15.0)

## 2022-02-04 LAB — PREPARE RBC (CROSSMATCH)

## 2022-02-04 MED ORDER — HYDROCORTISONE SOD SUC (PF) 100 MG IJ SOLR
25.0000 mg | Freq: Two times a day (BID) | INTRAMUSCULAR | Status: DC
  Filled 2022-02-04: qty 0.5

## 2022-02-04 MED ORDER — SODIUM CHLORIDE 0.9 % IV SOLN
3.0000 g | Freq: Two times a day (BID) | INTRAVENOUS | Status: DC
  Administered 2022-02-04 – 2022-02-05 (×4): 3 g via INTRAVENOUS
  Filled 2022-02-04 (×5): qty 8

## 2022-02-04 MED ORDER — CALCIUM CARBONATE ANTACID 500 MG PO CHEW
2.0000 | CHEWABLE_TABLET | Freq: Two times a day (BID) | ORAL | Status: DC
  Administered 2022-02-04 – 2022-02-06 (×4): 400 mg via ORAL
  Filled 2022-02-04 (×4): qty 2

## 2022-02-04 MED ORDER — MIDODRINE HCL 5 MG PO TABS
5.0000 mg | ORAL_TABLET | Freq: Three times a day (TID) | ORAL | Status: DC
  Administered 2022-02-04: 5 mg via ORAL
  Filled 2022-02-04: qty 1

## 2022-02-04 MED ORDER — ACETAMINOPHEN 325 MG PO TABS
650.0000 mg | ORAL_TABLET | Freq: Once | ORAL | Status: AC
  Administered 2022-02-04: 650 mg via ORAL
  Filled 2022-02-04: qty 2

## 2022-02-04 MED ORDER — HYDROMORPHONE HCL 1 MG/ML IJ SOLN: 0.5000 mg | INTRAMUSCULAR | Status: DC | PRN

## 2022-02-04 MED ORDER — SODIUM CHLORIDE 0.9% IV SOLUTION: Freq: Once | INTRAVENOUS | Status: AC

## 2022-02-04 NOTE — Progress Notes (Signed)
Mobility Specialist - Progress Note   02/04/22 0913  Mobility  Activity Ambulated with assistance in room;Ambulated with assistance to bathroom;Dangled on edge of bed;Stood at bedside  Level of Assistance Standby assist, set-up cues, supervision of patient - no hands on  Assistive Device Front wheel walker  Distance Ambulated (ft) 12 ft  Activity Response Tolerated well  Mobility Referral Yes  $Mobility charge 1 Mobility   Pt EOB on RA upon arrival. Pt STS indep with increased bed height. Pt ambulates to/from restroom SBA. Pt left in recliner with needs in reach, family in room, and RN in room.   Gretchen Short  Mobility Specialist  02/04/22 9:15 AM

## 2022-02-04 NOTE — Progress Notes (Signed)
Pharmacy Antibiotic Note  [name] is a 76 y.o. female admitted on 01/27/2022 with K. Pneumoniae bacteremia from acute cholecystitis.  Pharmacy has been consulted for ampicillin/sulbactam dosing.  Today, 02/04/2022 Day #8 antibiotics - previously on piperacillin/tazobactam Renal: Scr worsening WBC improving with repositioning cholecystostomy tube on 12/3 afebrile K pneumonia in blood and gallbladder cultures resistant to ampicillin, susc to others  Plan: Base on current renal function, start ampicillin/sulbactam 3gm IV q12h May need to decrease to q24h if renal function continues to decline  Height: '5\' 6"'$  (167.6 cm) Weight: 103 kg (227 lb 1.2 oz) IBW/kg (Calculated) : 59.3  Temp (24hrs), Avg:97.5 F (36.4 C), Min:97.3 F (36.3 C), Max:97.8 F (36.6 C)  Recent Labs  Lab 01/30/22 0716 01/30/22 1330 01/31/22 0508 02/01/22 0420 02/02/22 0500 02/03/22 0430 02/04/22 0814  WBC 44.6*  --  47.4* 39.5* 34.3* 24.0*  --   CREATININE  --    < > 2.54* 2.66* 2.86* 3.16* 3.41*  LATICACIDVEN  --   --   --  1.0  --   --   --    < > = values in this interval not displayed.    Estimated Creatinine Clearance: 17 mL/min (A) (by C-G formula based on SCr of 3.41 mg/dL (H)).    No Known Allergies  Antimicrobials this admission: 11/30 Zosyn >> 12/7 12/7 amp/sulb >>  Dose adjustments this admission:  Microbiology results: 11/29 Bcx: BCID 1/4, aerobic, K pneumo (R to amp) 11/30 Bile cx: Abundant K. pneumo R to amp  Thank you for allowing pharmacy to be a part of this patient's care.  Doreene Eland, PharmD, BCPS, BCIDP Work Cell: [phone] 02/04/2022 1:33 PM

## 2022-02-04 NOTE — Progress Notes (Signed)
[facility]  ROUNDING NOTE   Subjective:   Patient seen sitting up in bed, transferred from ICU yesterday Family at bedside Alert and oriented Just received breakfast tray, preparing for meal Remains on room air Lower extremity edema remains  Creatinine 3.41 Urine output 625 mL recorded in past 24 hours.   Objective:  Vital signs in last 24 hours:  Temp:  [97.3 F (36.3 C)-98 F (36.7 C)] 98 F (36.7 C) (12/07 1510) Pulse Rate:  [46-97] 46 (12/07 1510) Resp:  [17-20] 18 (12/07 1510) BP: (115-138)/(47-59) 138/57 (12/07 1510) SpO2:  [100 %] 100 % (12/07 1510) Weight:  [103 kg] 103 kg (12/07 0500)  Weight change:  Filed Weights   01/31/22 0500 01/31/22 1205 02/04/22 0500  Weight: 109.6 kg 107.4 kg 103 kg    Intake/Output: I/O last 3 completed shifts: In: 2480.5 [P.O.:360; I.V.:2035.3; Other:20; IV Piggyback:65.1] Out: 1500 [Urine:1425; Drains:75]   Intake/Output this shift:  No intake/output data recorded.  Physical Exam: General: NAD  Head: Normocephalic, atraumatic.  Dry oral mucosal membranes  Eyes: Anicteric  Lungs:  Clear to auscultation, normal effort  Heart: Regular rate and rhythm  Abdomen:  Soft, LLQ tender, drain in place  Extremities: Trace peripheral edema.  Neurologic: Nonfocal, moving all four extremities  Skin: No lesions  Access: None    Basic Metabolic Panel: Recent Labs  Lab 01/31/22 0508 02/01/22 0420 02/02/22 0500 02/03/22 0430 02/04/22 0814  NA 131* 127* 128* 128* 130*  K 4.1 3.9 4.6 3.9 3.8  CL 106 103 102 105 106  CO2 12* 13* 14* 17* 16*  GLUCOSE 125* 217* 228* 227* 232*  BUN 42* 43* 42* 48* 50*  CREATININE 2.54* 2.66* 2.86* 3.16* 3.41*  CALCIUM 7.1* 6.7* 6.9* 6.6* 6.9*  MG  --   --  2.0 2.0  --   PHOS  --   --  6.9* 6.4*  --      Liver Function Tests: Recent Labs  Lab 01/31/22 0508 02/01/22 0420  AST 19 21  ALT 11 11  ALKPHOS 96 101  BILITOT 1.7* 1.5*  PROT 5.1* 5.1*  ALBUMIN 1.7* 1.6*    No  results for input(s): "LIPASE", "AMYLASE" in the last 168 hours. No results for input(s): "AMMONIA" in the last 168 hours.  CBC: Recent Labs  Lab 01/30/22 0716 01/31/22 0508 02/01/22 0420 02/02/22 0500 02/03/22 0430 02/04/22 0814  WBC 44.6* 47.4* 39.5* 34.3* 24.0*  --   NEUTROABS  --   --   --   --  19.7*  --   HGB 9.7* 8.6* 8.4* 8.4* 7.5* 7.5*  HCT 30.8* 27.7* 26.8* 26.6* 23.1*  --   MCV 93.3 95.2 92.7 92.7 90.9  --   PLT 186 205 266 252 238  --      Cardiac Enzymes: No results for input(s): "CKTOTAL", "CKMB", "CKMBINDEX", "TROPONINI" in the last 168 hours.  BNP: Invalid input(s): "POCBNP"  CBG: Recent Labs  Lab 02/03/22 1224 02/03/22 1617 02/03/22 2054 02/04/22 0801 02/04/22 1154  GLUCAP 278* 258* 255* 214* 266*     Microbiology: Results for orders placed or performed during the hospital encounter of 01/27/22  Blood culture (routine x 2)     Status: Abnormal   Collection Time: 01/27/22 10:48 AM   Specimen: BLOOD  Result Value Ref Range Status   Specimen Description   Final    BLOOD BLOOD LEFT ARM Performed at [facility], [address], [place], [place] [address]    Special Requests  Final    BOTTLES DRAWN AEROBIC AND ANAEROBIC Blood Culture results may not be optimal due to an excessive volume of blood received in culture bottles Performed at [facility], [address] [place], [place] [address]    Culture  Setup Time   Final    GRAM NEGATIVE RODS AEROBIC BOTTLE ONLY CRITICAL RESULT CALLED TO, READ BACK BY AND VERIFIED WITH: Loura Back PHARMD 0800 01/28/22 HNM Performed at [facility], [address], [place], [place] [address]    Culture KLEBSIELLA PNEUMONIAE (A)  Final   Report Status 01/30/2022 FINAL  Final   Organism ID, Bacteria KLEBSIELLA PNEUMONIAE  Final      Susceptibility   Klebsiella pneumoniae - MIC*    AMPICILLIN >=32 RESISTANT Resistant     CEFAZOLIN <=4 SENSITIVE Sensitive     CEFEPIME <=0.12  SENSITIVE Sensitive     CEFTAZIDIME <=1 SENSITIVE Sensitive     CEFTRIAXONE <=0.25 SENSITIVE Sensitive     CIPROFLOXACIN <=0.25 SENSITIVE Sensitive     GENTAMICIN <=1 SENSITIVE Sensitive     IMIPENEM <=0.25 SENSITIVE Sensitive     TRIMETH/SULFA <=20 SENSITIVE Sensitive     AMPICILLIN/SULBACTAM 4 SENSITIVE Sensitive     PIP/TAZO <=4 SENSITIVE Sensitive     * KLEBSIELLA PNEUMONIAE  Resp Panel by RT-PCR (Flu A&B, Covid) Urine, Clean Catch     Status: None   Collection Time: 01/27/22 10:48 AM   Specimen: Urine, Clean Catch; Nasal Swab  Result Value Ref Range Status   SARS Coronavirus 2 by RT PCR NEGATIVE NEGATIVE Final    Comment: (NOTE) SARS-CoV-2 target nucleic acids are NOT DETECTED.  The SARS-CoV-2 RNA is generally detectable in upper respiratory specimens during the acute phase of infection. The lowest concentration of SARS-CoV-2 viral copies this assay can detect is 138 copies/mL. A negative result does not preclude SARS-Cov-2 infection and should not be used as the sole basis for treatment or other patient management decisions. A negative result may occur with  improper specimen collection/handling, submission of specimen other than nasopharyngeal swab, presence of viral mutation(s) within the areas targeted by this assay, and inadequate number of viral copies(<138 copies/mL). A negative result must be combined with clinical observations, patient history, and epidemiological information. The expected result is Negative.  Fact Sheet for Patients:  EntrepreneurPulse.com.au  Fact Sheet for Healthcare Providers:  IncredibleEmployment.be  This test is no t yet approved or cleared by the [place] FDA and  has been authorized for detection and/or diagnosis of SARS-CoV-2 by FDA under an Emergency Use Authorization (EUA). This EUA will remain  in effect (meaning this test can be used) for the duration of the COVID-19 declaration under Section  564(b)(1) of the Act, 21 U.S.C.section 360bbb-3(b)(1), unless the authorization is terminated  or revoked sooner.       Influenza A by PCR NEGATIVE NEGATIVE Final   Influenza B by PCR NEGATIVE NEGATIVE Final    Comment: (NOTE) The Xpert Xpress SARS-CoV-2/FLU/RSV plus assay is intended as an aid in the diagnosis of influenza from Nasopharyngeal swab specimens and should not be used as a sole basis for treatment. Nasal washings and aspirates are unacceptable for Xpert Xpress SARS-CoV-2/FLU/RSV testing.  Fact Sheet for Patients: EntrepreneurPulse.com.au  Fact Sheet for Healthcare Providers: IncredibleEmployment.be  This test is not yet approved or cleared by the [place] FDA and has been authorized for detection and/or diagnosis of SARS-CoV-2 by FDA under an Emergency Use Authorization (EUA). This EUA will remain in effect (meaning this  test can be used) for the duration of the COVID-19 declaration under Section 564(b)(1) of the Act, 21 U.S.C. section 360bbb-3(b)(1), unless the authorization is terminated or revoked.  Performed at [facility], [address] [place], [place] [address]   Blood Culture ID Panel (Reflexed)     Status: Abnormal   Collection Time: 01/27/22 10:48 AM  Result Value Ref Range Status   Enterococcus faecalis NOT DETECTED NOT DETECTED Final   Enterococcus Faecium NOT DETECTED NOT DETECTED Final   Listeria monocytogenes NOT DETECTED NOT DETECTED Final   Staphylococcus species NOT DETECTED NOT DETECTED Final   Staphylococcus aureus (BCID) NOT DETECTED NOT DETECTED Final   Staphylococcus epidermidis NOT DETECTED NOT DETECTED Final   Staphylococcus lugdunensis NOT DETECTED NOT DETECTED Final   Streptococcus species NOT DETECTED NOT DETECTED Final   Streptococcus agalactiae NOT DETECTED NOT DETECTED Final   Streptococcus pneumoniae NOT DETECTED NOT DETECTED Final   Streptococcus pyogenes NOT DETECTED NOT  DETECTED Final   A.calcoaceticus-baumannii NOT DETECTED NOT DETECTED Final   Bacteroides fragilis NOT DETECTED NOT DETECTED Final   Enterobacterales DETECTED (A) NOT DETECTED Final    Comment: Enterobacterales represent a large order of gram negative bacteria, not a single organism. CRITICAL RESULT CALLED TO, READ BACK BY AND VERIFIED WITH: GREGORY GREENWOOD PHARMD 0800 01/28/22 HNM    Enterobacter cloacae complex NOT DETECTED NOT DETECTED Final   Escherichia coli NOT DETECTED NOT DETECTED Final   Klebsiella aerogenes NOT DETECTED NOT DETECTED Final   Klebsiella oxytoca NOT DETECTED NOT DETECTED Final   Klebsiella pneumoniae DETECTED (A) NOT DETECTED Final    Comment: CRITICAL RESULT CALLED TO, READ BACK BY AND VERIFIED WITH: GREGORY GREENWOOD PHARMD 0800 01/28/22 HNM    Proteus species NOT DETECTED NOT DETECTED Final   Salmonella species NOT DETECTED NOT DETECTED Final   Serratia marcescens NOT DETECTED NOT DETECTED Final   Haemophilus influenzae NOT DETECTED NOT DETECTED Final   Neisseria meningitidis NOT DETECTED NOT DETECTED Final   Pseudomonas aeruginosa NOT DETECTED NOT DETECTED Final   Stenotrophomonas maltophilia NOT DETECTED NOT DETECTED Final   Candida albicans NOT DETECTED NOT DETECTED Final   Candida auris NOT DETECTED NOT DETECTED Final   Candida glabrata NOT DETECTED NOT DETECTED Final   Candida krusei NOT DETECTED NOT DETECTED Final   Candida parapsilosis NOT DETECTED NOT DETECTED Final   Candida tropicalis NOT DETECTED NOT DETECTED Final   Cryptococcus neoformans/gattii NOT DETECTED NOT DETECTED Final   CTX-M ESBL NOT DETECTED NOT DETECTED Final   Carbapenem resistance IMP NOT DETECTED NOT DETECTED Final   Carbapenem resistance KPC NOT DETECTED NOT DETECTED Final   Carbapenem resistance NDM NOT DETECTED NOT DETECTED Final   Carbapenem resist OXA 48 LIKE NOT DETECTED NOT DETECTED Final   Carbapenem resistance VIM NOT DETECTED NOT DETECTED Final    Comment: Performed  at [facility], [address] [place], [place] [address]  Urine Culture     Status: None   Collection Time: 01/27/22 11:03 AM   Specimen: Urine, Clean Catch  Result Value Ref Range Status   Specimen Description   Final    URINE, CLEAN CATCH Performed at [facility], [address] [place], [place] [address]    Special Requests   Final    NONE Performed at [facility], [address], [place], [place] [address]    Culture   Final    NO GROWTH Performed at [facility], [address] [place], [place] [address]  Report Status 01/28/2022 FINAL  Final  Blood culture (routine x 2)     Status: None   Collection Time: 01/27/22 11:30 AM   Specimen: BLOOD  Result Value Ref Range Status   Specimen Description BLOOD RIGHT ANTECUBITAL  Final   Special Requests   Final    BOTTLES DRAWN AEROBIC AND ANAEROBIC Blood Culture adequate volume   Culture   Final    NO GROWTH 5 DAYS Performed at [facility], [address], [place], [place] [address]    Report Status 02/01/2022 FINAL  Final  Aerobic/Anaerobic Culture w Gram Stain (surgical/deep wound)     Status: None   Collection Time: 01/28/22 10:23 AM   Specimen: Gallbladder; Bile  Result Value Ref Range Status   Specimen Description   Final    GALL BLADDER Performed at [facility], [address] [place], [place] [address]    Special Requests   Final    NONE Performed at [facility], [address] [place], [place] [address]    Gram Stain   Final    FEW WBC PRESENT, PREDOMINANTLY PMN FEW GRAM NEGATIVE RODS    Culture   Final    ABUNDANT KLEBSIELLA PNEUMONIAE NO ANAEROBES ISOLATED Performed at [facility], [address], [place], [place] [address]    Report Status 02/02/2022 FINAL  Final   Organism ID, Bacteria KLEBSIELLA PNEUMONIAE  Final      Susceptibility   Klebsiella pneumoniae - MIC*    AMPICILLIN >=32 RESISTANT Resistant     CEFAZOLIN  <=4 SENSITIVE Sensitive     CEFEPIME <=0.12 SENSITIVE Sensitive     CEFTAZIDIME <=1 SENSITIVE Sensitive     CEFTRIAXONE <=0.25 SENSITIVE Sensitive     CIPROFLOXACIN <=0.25 SENSITIVE Sensitive     GENTAMICIN <=1 SENSITIVE Sensitive     IMIPENEM <=0.25 SENSITIVE Sensitive     TRIMETH/SULFA <=20 SENSITIVE Sensitive     AMPICILLIN/SULBACTAM 8 SENSITIVE Sensitive     PIP/TAZO <=4 SENSITIVE Sensitive     * ABUNDANT KLEBSIELLA PNEUMONIAE  MRSA Next Gen by PCR, Nasal     Status: None   Collection Time: 01/31/22 12:05 PM   Specimen: Nasal Mucosa; Nasal Swab  Result Value Ref Range Status   MRSA by PCR Next Gen NOT DETECTED NOT DETECTED Final    Comment: (NOTE) The GeneXpert MRSA Assay (FDA approved for NASAL specimens only), is one component of a comprehensive MRSA colonization surveillance program. It is not intended to diagnose MRSA infection nor to guide or monitor treatment for MRSA infections. Test performance is not FDA approved in patients less than 41 years old. Performed at [facility], [address] [place], [place] [address]     Coagulation Studies: No results for input(s): "LABPROT", "INR" in the last 72 hours.  Urinalysis: No results for input(s): "COLORURINE", "LABSPEC", "PHURINE", "GLUCOSEU", "HGBUR", "BILIRUBINUR", "KETONESUR", "PROTEINUR", "UROBILINOGEN", "NITRITE", "LEUKOCYTESUR" in the last 72 hours.  Invalid input(s): "APPERANCEUR"    Imaging: No results found.   Medications:    sodium chloride Stopped (01/31/22 2120)   ampicillin-sulbactam (UNASYN) IV      apixaban  10 mg Oral BID   Followed by   Derrill Memo ON 02/05/2022] apixaban  5 mg Oral BID   aspirin EC  81 mg Oral Daily   calcium carbonate  1 tablet Oral BID   Chlorhexidine Gluconate Cloth  6 each Topical Q0600   ezetimibe  10 mg Oral Daily   famotidine  20 mg Oral QHS  FLUoxetine  10 mg Oral Daily   insulin aspart  0-5 Units Subcutaneous QHS   insulin aspart  0-9 Units Subcutaneous  TID WC   insulin aspart  2 Units Subcutaneous TID WC   insulin glargine-yfgn  5 Units Subcutaneous QHS   levothyroxine  125 mcg Oral Q0600   midodrine  5 mg Oral TID WC   multivitamin with minerals  1 tablet Oral Daily   polyethylene glycol  17 g Oral Daily   Ensure Max Protein  11 oz Oral BID   rosuvastatin  20 mg Oral Daily   sodium bicarbonate  1,300 mg Oral BID   sodium chloride flush  5 mL Intracatheter Q8H   acetaminophen, ALPRAZolam, bisacodyl, HYDROmorphone (DILAUDID) injection, levalbuterol, nitroGLYCERIN, ondansetron (ZOFRAN) IV, ondansetron, traMADol  Assessment/ Plan:  Ms. GALILEE PIERRON is a 76 y.o.  female  with medical problems of  DM-2, CAD, non-STEMI, hypertension, hyperlipidemia, hypothyroidism, chronic diastolic CHF, thrombocytopenia, major depressive disorder in remission, obstructive sleep apnea, portal vein thrombosis, obesity, acute left posterior tibial vein DVT,  was admitted on 01/27/2022 for Cholecystitis [K81.9] NSTEMI (non-ST elevated myocardial infarction) ([place]) [I21.4] Fever, unspecified fever cause [R50.9]   Acute kidney injury, acute metabolic acidosis nongap, Sepsis Likely ATN due to multifactorial etiology from hypotension, Klebsiella sepsis, IV contrast exposure, IV Toradol (nonsteroidals) Serum mean trends are worsening.  Baseline creatinine of 0.9, GFR greater than 60 from 12/11/2020.  Lab Results  Component Value Date   CREATININE 3.41 (H) 02/04/2022   CREATININE 3.16 (H) 02/03/2022   CREATININE 2.86 (H) 02/02/2022    Intake/Output Summary (Last 24 hours) at 02/04/2022 1648 Last data filed at 02/04/2022 0649 Gross per 24 hour  Intake 10 ml  Output 660 ml  Net -650 ml    Creatinine continues to rise however due to absence of uremic symptoms and urine output.  No acute need for dialysis at this time.  Will continue to monitor.  Continue avoid nephrotoxic agents and therapies.  Bicarb stable with oral supplementation.  Calcium remains  decreased.  Will increase calcium carbonate to 2 tablets twice daily.  Remains on Unasyn and Diflucan.    LOS: 8 Kalya Troeger 12/7/20234:48 PM

## 2022-02-04 NOTE — Progress Notes (Signed)
Subjective:  I saw the patient yesterday in the ICU.  She has now relocated to the orthopedic floor.  She has a cholecystostomy tube in place.  She was given a unit of PRBC today for hemoglobin of 7.5.  Patient's antibiotics were changed from Zosyn to Unasyn.  Her anemia is 3.41.  Patient is seen this evening up out of bed to a chair.  She is alert and laughing.  Her family is in the room.  Patient states she is spent the majority of the day in the chair.  She was able to get up with physical therapy.   Objective:   VITALS:   Vitals:   02/04/22 0758 02/04/22 1152 02/04/22 1216 02/04/22 1510  BP: (!) 118/47 (!) 131/56 (!) 135/59 (!) 138/57  Pulse: (!) 54 (!) 51 (!) 54 (!) 46  Resp: '18 17 18 18  '$ Temp: (!) 97.3 F (36.3 C) 97.7 F (36.5 C) (!) 97.5 F (36.4 C) 98 F (36.7 C)  TempSrc:  Oral Oral   SpO2: 100% 100%  100%  Weight:      Height:        PHYSICAL EXAM: I examined her knees yesterday in the ICU.  Her total knee incisions are well-healed from her prior surgeries with me.  There is no erythema ecchymosis or effusions in either knee.  She was neurovascular intact in both lower extremities.  LABS  Results for orders placed or performed during the hospital encounter of 01/27/22 (from the past 24 hour(s))  Glucose, capillary     Status: Abnormal   Collection Time: 02/03/22  8:54 PM  Result Value Ref Range   Glucose-Capillary 255 (H) 70 - 99 mg/dL  Glucose, capillary     Status: Abnormal   Collection Time: 02/04/22  8:01 AM  Result Value Ref Range   Glucose-Capillary 214 (H) 70 - 99 mg/dL  Type and screen [place]     Status: None (Preliminary result)   Collection Time: 02/04/22  8:04 AM  Result Value Ref Range   ABO/RH(D) O POS    Antibody Screen NEG    Sample Expiration 02/07/2022,2359    Unit Number [id number]    Blood Component Type RED CELLS,LR    Unit division 00    Status of Unit ISSUED    Transfusion Status OK TO TRANSFUSE     Crossmatch Result      Compatible Performed at [facility], [address] [place], [place] [address]   Basic metabolic panel     Status: Abnormal   Collection Time: 02/04/22  8:14 AM  Result Value Ref Range   Sodium 130 (L) 135 - 145 mmol/L   Potassium 3.8 3.5 - 5.1 mmol/L   Chloride 106 98 - 111 mmol/L   CO2 16 (L) 22 - 32 mmol/L   Glucose, Bld 232 (H) 70 - 99 mg/dL   BUN 50 (H) 8 - 23 mg/dL   Creatinine, Ser 3.41 (H) 0.44 - 1.00 mg/dL   Calcium 6.9 (L) 8.9 - 10.3 mg/dL   GFR, Estimated 13 (L) >60 mL/min   Anion gap 8 5 - 15  Hemoglobin     Status: Abnormal   Collection Time: 02/04/22  8:14 AM  Result Value Ref Range   Hemoglobin 7.5 (L) 12.0 - 15.0 g/dL  Prepare RBC (crossmatch)     Status: None   Collection Time: 02/04/22  9:26 AM  Result Value Ref Range   Order Confirmation  ORDER PROCESSED BY BLOOD BANK Performed at [facility], [address] [place], [place] [address]   Glucose, capillary     Status: Abnormal   Collection Time: 02/04/22 11:54 AM  Result Value Ref Range   Glucose-Capillary 266 (H) 70 - 99 mg/dL  Glucose, capillary     Status: Abnormal   Collection Time: 02/04/22  5:18 PM  Result Value Ref Range   Glucose-Capillary 276 (H) 70 - 99 mg/dL    No results found.  Assessment/Plan:     Principal Problem:   Septic shock (HCC) Active Problems:   Controlled type 2 diabetes mellitus with diabetic nephropathy (HCC)   Obstructive sleep apnea on CPAP   Hyperlipidemia associated with type 2 diabetes mellitus ([place])   Major depressive disorder, recurrent, in remission ([place])   Obesity (BMI 30.0-34.9)   NSTEMI (non-ST elevated myocardial infarction) (HCC)   Chronic kidney disease, stage 3a (HCC)   HTN (hypertension)   Hypothyroidism   CAD (coronary artery disease)   Chronic diastolic CHF (congestive heart failure) (HCC)   Cholecystitis   Thrombocytopenia (HCC)   Portal vein thrombosis   DVT (deep venous thrombosis) (HCC)    Fever   AKI (acute kidney injury) (HCC)   Hyponatremia   Iron deficiency anemia  Continue IV antibiotics as ordered by the hospital service.  Continue physical therapy as medically appropriate.  I will continue to follow.    Thornton Park , MD 02/04/2022, 7:57 PM

## 2022-02-04 NOTE — TOC Initial Note (Addendum)
Transition of Care [facility]) - Initial/Assessment Note    Patient Details  Name: [name] MRN: [id number] Date of Birth: [birth year]  Transition of Care [facility]) CM/SW Contact:    Shelbie Hutching, RN Phone Number: 02/04/2022, 3:21 PM  Clinical Narrative:                 RNCM met with patient at the bedside yesterday in the ICU, introduced self and explained role in DC planning.  Patient is from home where she lives in a tiny home behind her daughter's house.  Patient is independent and very active.  Patient drives.  Patient has been going to Outpatient physical therapy at [facility] in [place].  Patient has a cane and walker at home but usually does not need them.   Spoke with patient and family via phone today, PT is currently recommending [facility].  Family says they agree with that to start with until patient gets back up and about.  They do not have a preference on agency.  [facility] is unable to accept at this time.  Referral for PT given to [place] with [facility].  Might not be a bad idea to have home health RN follow as well.    TOC will cont to follow.   Expected Discharge Plan: [place] Barriers to Discharge: Continued Medical Work up   Patient Goals and CMS Choice Patient states their goals for this hospitalization and ongoing recovery are:: get back home and back to where she was CMS Medicare.gov Compare Post Acute Care list provided to:: Patient Choice offered to / list presented to : Patient  Expected Discharge Plan and Services Expected Discharge Plan: [place]   Discharge Planning Services: CM Consult Post Acute Care Choice: [place] arrangements for the past 2 months: Single Family Home                 DME Arranged: N/A DME Agency: NA       HH Arranged: PT HH Agency: [place] Date [place]: 02/04/22 Time [place]: 37 Representative spoke with at [place]: [place]  Prior Living  Arrangements/Services Living arrangements for the past 2 months: [place] with:: Self Patient language and need for interpreter reviewed:: Yes Do you feel safe going back to the place where you live?: Yes      Need for Family Participation in Patient Care: Yes (Comment) Care giver support system in place?: Yes (comment) Current home services: DME (RW, cane) Criminal Activity/Legal Involvement Pertinent to Current Situation/Hospitalization: No - Comment as needed  Activities of Daily Living Home Assistive Devices/Equipment: None ADL Screening (condition at time of admission) Patient's cognitive ability adequate to safely complete daily activities?: Yes Is the patient deaf or have difficulty hearing?: No Does the patient have difficulty seeing, even when wearing glasses/contacts?: No Does the patient have difficulty concentrating, remembering, or making decisions?: No Patient able to express need for assistance with ADLs?: Yes Does the patient have difficulty dressing or bathing?: No Independently performs ADLs?: Yes (appropriate for developmental age) Does the patient have difficulty walking or climbing stairs?: No Weakness of Legs: None Weakness of Arms/Hands: None  Permission Sought/Granted Permission sought to share information with : Family Supports Permission granted to share information with : Yes, Verbal Permission Granted  Share Information with NAME: Myles Lipps  Permission granted to share info w AGENCY: home health  Permission granted to share info w Relationship:  daughter  Permission granted to share info w Contact Information: [phone]  Emotional Assessment Appearance:: Appears stated age Attitude/Demeanor/Rapport: Engaged Affect (typically observed): Accepting Orientation: : Oriented to Self, Oriented to Place, Oriented to  Time, Oriented to Situation Alcohol / Substance Use: Not Applicable Psych Involvement: No (comment)  Admission diagnosis:   Cholecystitis [K81.9] NSTEMI (non-ST elevated myocardial infarction) ([place]) [I21.4] Fever, unspecified fever cause [R50.9] Patient Active Problem List   Diagnosis Date Noted   Septic shock ([place]) 02/03/2022   AKI (acute kidney injury) ([place]) 02/03/2022   Hyponatremia 02/03/2022   Iron deficiency anemia 02/03/2022   Fever 01/28/2022   NSTEMI (non-ST elevated myocardial infarction) ([place]) 01/27/2022   Chronic kidney disease, stage 3a ([place]) 01/27/2022   HTN (hypertension) 01/27/2022   Hypothyroidism 01/27/2022   CAD (coronary artery disease) 01/27/2022   Chronic diastolic CHF (congestive heart failure) ([place]) 01/27/2022   Cholecystitis 01/27/2022   Thrombocytopenia ([place]) 01/27/2022   Portal vein thrombosis 01/27/2022   DVT (deep venous thrombosis) ([place]) 01/27/2022   S/P TKR (total knee replacement) using cement, left 12/10/2021   S/P TKR (total knee replacement) using cement, right 12/18/2020   Obesity (BMI 30.0-34.9) 01/23/2018   Ear noise/buzzing, bilateral 06/23/2017   Coronary artery disease of native artery of native heart with stable angina pectoris ([place])    Hypertensive heart disease    Ischemic cardiomyopathy    CKD (chronic kidney disease), stage III ([place]) 08/08/2014   Neuritis 07/11/2014   Hypothyroidism, postop 07/11/2014   Arthritis of knee, degenerative 07/11/2014   Major depressive disorder, recurrent, in remission ([place]) 07/11/2014   History of endometrial cancer 07/11/2014   Benign hypertension with CKD (chronic kidney disease) stage III ([place]) 07/11/2014   Cardiovascular degeneration (with mention of arteriosclerosis) 07/11/2014   History of coronary artery stent placement 08/30/2013   Controlled type 2 diabetes mellitus with diabetic nephropathy ([place]) 08/30/2013   Obstructive sleep apnea on CPAP 08/30/2013   Hyperlipidemia associated with type 2 diabetes mellitus ([place]) 08/30/2013   History of ST elevation myocardial infarction (STEMI) 08/30/2013   PCP:  Olin Hauser, DO Pharmacy:   [facility] - [place], [place] - [address] [facility] [address] [place] [address] [place] [address] Phone: [phone] Fax: [id number] - [place], [place] - [place] [facility] [address] [place] [address] Phone: [phone] Fax: [place], [place] [address] [place] [address] Phone: [phone] Fax: [phone]     Social Determinants of Health (SDOH) Interventions    Readmission Risk Interventions     No data to display

## 2022-02-04 NOTE — Progress Notes (Signed)
PT Cancellation Note  Patient Details Name: [name] MRN: [id number] DOB: September 12, 1945   Cancelled Treatment:    Reason Eval/Treat Not Completed: Other (comment).  Pt currently receiving blood transfusion.  Will re-attempt PT session at a later date/time.  Leitha Bleak, PT 02/04/22, 2:45 PM

## 2022-02-04 NOTE — Progress Notes (Signed)
Progress Note   Patient: [name] HMC:[id number] DOB: September 13, 1945 DOA: 01/27/2022     8 DOS: the patient was seen and examined on 02/04/2022   Brief hospital course: 76 y.o  female with significant PMH of CAD s/p STENT, HLD, DM, Hypothyroidism, OSA, Endometrial cancer, Diastolic CHF, CKD stage III, who presented to the ED with chief complaints of fever, nausea and vomiting found to have NSTEMI ,dilated CBD and acute cholecystitis with large gallstone in the neck of the gallbladder.    ED Course: Initial vital signs showed HR of 72 beats/minute, BP 80/48 mm Hg, the RR 30 breaths/minute, and the oxygen saturation 94% on RA and a temperature of 98.23F (36.9C).  Pertinent Labs/Diagnostics Findings: Chemistry:Na+/ K+: 132/3.2  Glucose:133  BUN/Cr.:45/1.32 CBC: WBC: 6.4 Hgb/Hct: 9.6/30.14 Plts:118 Other Lab findings:   PCT: 12.71  Lactic acid:1.0  COVID PCR: Negative, Troponin: 865 BNP: 38.4  Imaging:  CT findings as below noted for  portal vein thrombosis, dilated CBD and acute Left DVT.   Hospital Course: Patient admitted to stepdown unit under hospitalist service. Due to findings as above General surgery, GI and Cardiology was consulted. Patient was evaluated by cardiology who with recs to start heparin gtt x 48 hrs and General surgery with recs to obtain MRI and perc chole. Patient underwent image-guided percutaneous cholecystostomy tube placement. Patient developed worsening pain with increased WBC, and hypotension. Repeat CT showed abnormal position of the GB drain. IR consulted and drain replaced. Patient transferred to ICU for pressors. PCCM consulted.  11/29: Admit to stepdown unit with Klebsiella sepsis secondary to acute cholecystitis/gallstones  11/30: Patient underwent image-guided percutaneous cholecystostomy tube placement.  Started on heparin drip for NSTEMI 12/2: Patient with worsening pain and increased white count.  Repeat CT abdomen showed abnormal positioning GB drain 12/3:  S/p replacement of new 10 FR percutaneous cholecystectomy tube by IR.  Remained hypotensive transferred to ICU for pressors PCCM consulted 12/14 remains on pressors 12/5 remains on pressors 12/6 Off pressors 12/7 transferred out of the ICU 12/8 creatinine continues to rise at 3.41 patient was able to urinate last night and this morning.  With hemoglobin 7.5 will give a transfusion of 1 unit of packed red blood cells.  Change antibiotics from Zosyn over to Unasyn.  Discontinue central line.  Assessment and Plan: * Septic shock ([place]) Present on admission.  Klebsiella growing out of blood cultures.  Percutaneous gallbladder drain placement on 11/30 with revision on 12/3.  Gallbladder is the cause of the patient's sepsis.  Off pressors since 12 7.  Taper midodrine to 5 mg 3 times daily.  Discontinued stress dose steroids after this morning's dose.    AKI (acute kidney injury) ([place]) Baseline creatinine 0.93.  Creatinine continues to rise at 3.41.  We will need to see creatinine peak and then come down prior to disposition.  Patient urinating now.  NSTEMI (non-ST elevated myocardial infarction) ([place]) Secondary to septic shock.  Continue aspirin.  Continue Crestor and Zetia  Iron deficiency anemia Watch closely while on blood thinners.  Last hemoglobin 7.5.  Transfuse 1 unit of packed red blood cells.  Hyponatremia Discontinue fluids.  Advancing diet.  Hyperlipidemia associated with type 2 diabetes mellitus (HCC) Hemoglobin A1c 6.8.  Continue Crestor.  On sliding scale insulin.  Continue to hold metformin.  Chronic diastolic CHF (congestive heart failure) (HCC) Discontinue fluids.  No signs of heart failure currently.  Last EF 60%.  Thrombocytopenia ([place]) Resolved.  Platelet count normal range.  Obstructive sleep apnea  on CPAP CPAP at night  Obesity (BMI 30.0-34.9) BMI 36.65  DVT (deep venous thrombosis) (HCC) On Eliquis        Subjective: Patient feels okay.  Little short of  breath.  Feels the anemia may be driving this.  She is okay with the blood transfusion.  Admitted initially with septic shock.  Physical Exam: Vitals:   02/04/22 0758 02/04/22 1152 02/04/22 1216 02/04/22 1510  BP: (!) 118/47 (!) 131/56 (!) 135/59 (!) 138/57  Pulse: (!) 54 (!) 51 (!) 54 (!) 46  Resp: '18 17 18 18  '$ Temp: (!) 97.3 F (36.3 C) 97.7 F (36.5 C) (!) 97.5 F (36.4 C) 98 F (36.7 C)  TempSrc:  Oral Oral   SpO2: 100% 100%  100%  Weight:      Height:       Physical Exam HENT:     Head: Normocephalic.     Mouth/Throat:     Pharynx: No oropharyngeal exudate.  Eyes:     General: Lids are normal.     Conjunctiva/sclera: Conjunctivae normal.  Cardiovascular:     Rate and Rhythm: Normal rate and regular rhythm.     Heart sounds: Normal heart sounds, S1 normal and S2 normal.  Pulmonary:     Breath sounds: No decreased breath sounds, wheezing, rhonchi or rales.  Abdominal:     Palpations: Abdomen is soft.     Tenderness: There is no abdominal tenderness.  Musculoskeletal:     Right forearm: Swelling present.     Left forearm: Swelling present.     Right lower leg: Swelling present.     Left lower leg: Swelling present.  Skin:    General: Skin is warm.     Findings: No rash.  Neurological:     Mental Status: She is alert and oriented to person, place, and time.     Data Reviewed: Creatinine 3.41, sodium 130, hemoglobin 7.5  Family Communication: Permission to speak in front of visitor at the bedside  Disposition: Status is: Inpatient Remains inpatient appropriate because: Receiving blood transfusion today.  With creatinine worsening will need to see creatinine peak and start to come down prior to any disposition.  Planned Discharge Destination: Home with home health    Time spent: 27 minutes  Author: Loletha Grayer, MD 02/04/2022 4:30 PM  For on call review [email or web address].

## 2022-02-04 NOTE — Plan of Care (Signed)

## 2022-02-05 DIAGNOSIS — N179 Acute kidney failure, unspecified: Secondary | ICD-10-CM | POA: Diagnosis not present

## 2022-02-05 DIAGNOSIS — I214 Non-ST elevation (NSTEMI) myocardial infarction: Secondary | ICD-10-CM | POA: Diagnosis not present

## 2022-02-05 DIAGNOSIS — D509 Iron deficiency anemia, unspecified: Secondary | ICD-10-CM | POA: Diagnosis not present

## 2022-02-05 DIAGNOSIS — A419 Sepsis, unspecified organism: Secondary | ICD-10-CM | POA: Diagnosis not present

## 2022-02-05 LAB — GLUCOSE, CAPILLARY
Glucose-Capillary: 185 mg/dL — ABNORMAL HIGH (ref 70–99)
Glucose-Capillary: 207 mg/dL — ABNORMAL HIGH (ref 70–99)
Glucose-Capillary: 222 mg/dL — ABNORMAL HIGH (ref 70–99)
Glucose-Capillary: 217 mg/dL — ABNORMAL HIGH (ref 70–99)

## 2022-02-05 LAB — CBC
HCT: 26 % — ABNORMAL LOW (ref 36.0–46.0)
Hemoglobin: 8.5 g/dL — ABNORMAL LOW (ref 12.0–15.0)
MCH: 28.9 pg (ref 26.0–34.0)
MCHC: 32.7 g/dL (ref 30.0–36.0)
MCV: 88.4 fL (ref 80.0–100.0)
Platelets: 274 10*3/uL (ref 150–400)
RBC: 2.94 MIL/uL — ABNORMAL LOW (ref 3.87–5.11)
RDW: 15 % (ref 11.5–15.5)
WBC: 14.5 10*3/uL — ABNORMAL HIGH (ref 4.0–10.5)
nRBC: 0 % (ref 0.0–0.2)

## 2022-02-05 LAB — TYPE AND SCREEN
ABO/RH(D): O POS
Antibody Screen: NEGATIVE
Unit division: 0

## 2022-02-05 LAB — BPAM RBC
Blood Product Expiration Date: 202401052359
ISSUE DATE / TIME: 202312071154
Unit Type and Rh: 5100

## 2022-02-05 LAB — BASIC METABOLIC PANEL
Anion gap: 5 (ref 5–15)
BUN: 50 mg/dL — ABNORMAL HIGH (ref 8–23)
CO2: 17 mmol/L — ABNORMAL LOW (ref 22–32)
Calcium: 7 mg/dL — ABNORMAL LOW (ref 8.9–10.3)
Chloride: 108 mmol/L (ref 98–111)
Creatinine, Ser: 2.99 mg/dL — ABNORMAL HIGH (ref 0.44–1.00)
GFR, Estimated: 16 mL/min — ABNORMAL LOW (ref >60)
Glucose, Bld: 231 mg/dL — ABNORMAL HIGH (ref 70–99)
Potassium: 3.2 mmol/L — ABNORMAL LOW (ref 3.5–5.1)
Sodium: 130 mmol/L — ABNORMAL LOW (ref 135–145)

## 2022-02-05 MED ORDER — POTASSIUM CHLORIDE CRYS ER 20 MEQ PO TBCR
40.0000 meq | EXTENDED_RELEASE_TABLET | Freq: Once | ORAL | Status: AC
  Administered 2022-02-05: 40 meq via ORAL
  Filled 2022-02-05: qty 2

## 2022-02-05 MED ORDER — SODIUM CHLORIDE FLUSH 0.9 % IV SOLN: 10.0000 mL | INTRAVENOUS | 1 refills | Status: AC | PRN

## 2022-02-05 MED ORDER — ALBUMIN HUMAN 25 % IV SOLN
12.5000 g | Freq: Once | INTRAVENOUS | Status: AC
  Administered 2022-02-05: 12.5 g via INTRAVENOUS
  Filled 2022-02-05: qty 50

## 2022-02-05 MED ORDER — MIDODRINE HCL 5 MG PO TABS
2.5000 mg | ORAL_TABLET | Freq: Three times a day (TID) | ORAL | Status: DC
  Administered 2022-02-05: 2.5 mg via ORAL
  Filled 2022-02-05: qty 1

## 2022-02-05 NOTE — Progress Notes (Signed)
Progress Note   Patient: [name] EXH:[id number] DOB: [birth year] DOA: 01/27/2022     9 DOS: the patient was seen and examined on 02/05/2022   Brief hospital course: 76 y.o  female with significant PMH of CAD s/p STENT, HLD, DM, Hypothyroidism, OSA, Endometrial cancer, Diastolic CHF, CKD stage III, who presented to the ED with chief complaints of fever, nausea and vomiting found to have NSTEMI ,dilated CBD and acute cholecystitis with large gallstone in the neck of the gallbladder.    ED Course: Initial vital signs showed HR of 72 beats/minute, BP 80/48 mm Hg, the RR 30 breaths/minute, and the oxygen saturation 94% on RA and a temperature of 98.4F (36.9C).  Pertinent Labs/Diagnostics Findings: Chemistry:Na+/ K+: 132/3.2  Glucose:133  BUN/Cr.:45/1.32 CBC: WBC: 6.4 Hgb/Hct: 9.6/30.14 Plts:118 Other Lab findings:   PCT: 12.71  Lactic acid:1.0  COVID PCR: Negative, Troponin: 865 BNP: 38.4  Imaging:  CT findings as below noted for  portal vein thrombosis, dilated CBD and acute Left DVT.   Hospital Course: Patient admitted to stepdown unit under hospitalist service. Due to findings as above General surgery, GI and Cardiology was consulted. Patient was evaluated by cardiology who with recs to start heparin gtt x 48 hrs and General surgery with recs to obtain MRI and perc chole. Patient underwent image-guided percutaneous cholecystostomy tube placement. Patient developed worsening pain with increased WBC, and hypotension. Repeat CT showed abnormal position of the GB drain. IR consulted and drain replaced. Patient transferred to ICU for pressors. PCCM consulted.  11/29: Admit to stepdown unit with Klebsiella sepsis secondary to acute cholecystitis/gallstones  11/30: Patient underwent image-guided percutaneous cholecystostomy tube placement.  Started on heparin drip for NSTEMI 12/2: Patient with worsening pain and increased white count.  Repeat CT abdomen showed abnormal positioning GB drain 12/3:  S/p replacement of new 10 FR percutaneous cholecystectomy tube by IR.  Remained hypotensive transferred to ICU for pressors PCCM consulted 12/14 remains on pressors 12/5 remains on pressors 12/6 Off pressors 12/7 transferred out of the ICU 12/8 creatinine continues to rise at 3.41 patient was able to urinate last night and this morning.  With hemoglobin 7.5 will give a transfusion of 1 unit of packed red blood cells.  Change antibiotics from Zosyn over to Unasyn.  Discontinue central line. 12/9.  Creatinine improved to 2.99.  Patient urinating very well.  Hemoglobin up to 8.5 after transfusion.  Assessment and Plan: * Septic shock ([place]) Present on admission.  Klebsiella growing out of blood cultures.  Percutaneous gallbladder drain placement on 11/30 with revision on 12/3.  Gallbladder is the cause of the patient's sepsis.  Off pressors since 12/7.  Discontinued stress dose steroids on 12/7.  Discontinued midodrine today.  AKI (acute kidney injury) ([place]) Baseline creatinine 0.93.  Creatinine peaked on 12/7 at 3.41.  Creatinine on 12/8 2.99.  NSTEMI (non-ST elevated myocardial infarction) ([place]) Secondary to septic shock.  Continue aspirin.  Continue Crestor and Zetia  Iron deficiency anemia Watch closely while on blood thinners.  Last hemoglobin 8.5.  Received a blood transfusion on 12/7.  Hyponatremia Sodium 130 today.  Hyperlipidemia associated with type 2 diabetes mellitus (HCC) Hemoglobin A1c 6.8.  Continue Crestor.  On sliding scale insulin.  Continue to hold metformin.  Chronic diastolic CHF (congestive heart failure) (HCC) Discontinue fluids.  No signs of heart failure currently.  Last EF 60%.  Will give a dose of albumin.  Thrombocytopenia ([place]) Resolved.  Platelet count normal range.  Obstructive sleep apnea on CPAP CPAP  at night  Obesity (BMI 30.0-34.9) BMI 36.65  DVT (deep venous thrombosis) (HCC) On Eliquis        Subjective: Patient feeling well.  Urinating  a lot better.  Still swollen.  Blood pressure starting to come up.  Felt better after blood transfusion.  Initially admitted with septic shock.  Physical Exam: Vitals:   02/04/22 1216 02/04/22 1510 02/05/22 0500 02/05/22 0800  BP: (!) 135/59 (!) 138/57 (!) 131/55 (!) 148/59  Pulse: (!) 54 (!) 46 (!) 50 (!) 52  Resp: '18 18 20 17  '$ Temp: (!) 97.5 F (36.4 C) 98 F (36.7 C) 98.7 F (37.1 C) 97.6 F (36.4 C)  TempSrc: Oral     SpO2:  100% 100% 97%  Weight:      Height:       Physical Exam HENT:     Head: Normocephalic.     Mouth/Throat:     Pharynx: No oropharyngeal exudate.  Eyes:     General: Lids are normal.     Conjunctiva/sclera: Conjunctivae normal.  Cardiovascular:     Rate and Rhythm: Normal rate and regular rhythm.     Heart sounds: Normal heart sounds, S1 normal and S2 normal.  Pulmonary:     Breath sounds: No decreased breath sounds, wheezing, rhonchi or rales.  Abdominal:     Palpations: Abdomen is soft.     Tenderness: There is no abdominal tenderness.  Musculoskeletal:     Right forearm: Swelling present.     Left forearm: Swelling present.     Right lower leg: Swelling present.     Left lower leg: Swelling present.  Skin:    General: Skin is warm.     Findings: No rash.  Neurological:     Mental Status: She is alert and oriented to person, place, and time.     Data Reviewed: Creatinine 2.99, potassium 3.2, sodium 130, white blood cell count 14.5, hemoglobin 8.5, platelet count 274  Family Communication: Updated patient's daughter on the phone  Disposition: Status is: Inpatient Remains inpatient appropriate because:   Planned Discharge Destination: Home    Time spent: 28 minutes  Author: Loletha Grayer, MD 02/05/2022 1:17 PM  For on call review [email or web address].

## 2022-02-05 NOTE — Progress Notes (Signed)
Physical Therapy Treatment Patient Details Name: [name] MRN: [id number] DOB: Aug 22, 1945 Today's Date: 02/05/2022   History of Present Illness 76 y.o  female with significant PMH of CAD s/p STENT, HLD, DM, Hypothyroidism, OSA, Endometrial cancer, Diastolic CHF, CKD stage III, who presented to the ED with chief complaints of fever, nausea and vomiting found to have NSTEMI ,dilated CBD and acute cholecystitis with large gallstone in the neck of the gallbladder.    PT Comments    Patient relaxed in recliner upon arrival but agreeable to PT. Patient needed cuing for improved forwards shift and required multiple attempts to stand from chair to RW using B UE support and supervision from PT for safety.  Patient ambulated approx 260 feet around nursing station with RW and two standing breaks due to feeling short of breath. HR and SpO2 remained WFL throughout session. Patient demonstrated good safety awareness and AD/body positioning for safe mobility. Her sister who is visiting her from [place] since she has been sick was present in room during session and adept at helping patient. Discharge plan remains appropriate. Patient would benefit from continued skilled physical therapy to address impairments and functional limitations (see PT Problem List below) to work towards stated goals and return to PLOF or maximal functional independence.     Recommendations for follow up therapy are one component of a multi-disciplinary discharge planning process, led by the attending physician.  Recommendations may be updated based on patient status, additional functional criteria and insurance authorization.  Follow Up Recommendations  Home health PT     Assistance Recommended at Discharge Set up Supervision/Assistance  Patient can return home with the following Assist for transportation;Assistance with cooking/housework;A little help with walking and/or transfers;A little help with bathing/dressing/bathroom    Equipment Recommendations  None recommended by PT    Recommendations for Other Services       Precautions / Restrictions Precautions Precautions: Fall Restrictions Weight Bearing Restrictions: No     Mobility  Bed Mobility                    Transfers Overall transfer level: Needs assistance Equipment used: Rolling walker (2 wheels) Transfers: Sit to/from Stand Sit to Stand: Supervision           General transfer comment: able to rise with B UE support from chair with multiple attempts and cuing to shift weight forwards.    Ambulation/Gait Ambulation/Gait assistance: Supervision Gait Distance (Feet): 260 Feet Assistive device: Rolling walker (2 wheels) Gait Pattern/deviations: WFL(Within Functional Limits)       General Gait Details: Patient ambulated around nursing station with RW and supervision. She needed to stop breifly twice due to feeling short of breath but SpO2 was in high 90s and HR within expected limites.   Stairs             Wheelchair Mobility    Modified Rankin (Stroke Patients Only)       Balance Overall balance assessment: Needs assistance Sitting-balance support: Feet supported Sitting balance-Leahy Scale: Good     Standing balance support: Reliant on assistive device for balance, During functional activity Standing balance-Leahy Scale: Good Standing balance comment: reliant on RW for balance during ambulation.                            Cognition Arousal/Alertness: Awake/alert Behavior During Therapy: WFL for tasks assessed/performed Overall Cognitive Status: Within Functional Limits for tasks assessed  Exercises      General Comments        Pertinent Vitals/Pain Pain Assessment Pain Assessment: No/denies pain    Home Living                          Prior Function            PT Goals (current goals can now be found in  the care plan section) Acute Rehab PT Goals Patient Stated Goal: go home PT Goal Formulation: With patient Time For Goal Achievement: 02/16/22 Potential to Achieve Goals: Good Progress towards PT goals: Progressing toward goals    Frequency    Min 2X/week      PT Plan Current plan remains appropriate    Co-evaluation              AM-PAC PT "6 Clicks" Mobility   Outcome Measure  Help needed turning from your back to your side while in a flat bed without using bedrails?: A Little Help needed moving from lying on your back to sitting on the side of a flat bed without using bedrails?: A Little Help needed moving to and from a bed to a chair (including a wheelchair)?: A Little Help needed standing up from a chair using your arms (e.g., wheelchair or bedside chair)?: A Little Help needed to walk in hospital room?: A Little Help needed climbing 3-5 steps with a railing? : A Little 6 Click Score: 18    End of Session Equipment Utilized During Treatment: Gait belt Activity Tolerance: Patient tolerated treatment well Patient left: in chair;with family/visitor present;with call bell/phone within reach Nurse Communication: Mobility status PT Visit Diagnosis: Muscle weakness (generalized) (M62.81);Difficulty in walking, not elsewhere classified (R26.2)     Time: 1219-7588 PT Time Calculation (min) (ACUTE ONLY): 23 min  Charges:  $Gait Training: 8-22 mins $Therapeutic Activity: 8-22 mins                     Everlean Alstrom. Graylon Good, PT, DPT 02/05/22, 2:21 PM

## 2022-02-05 NOTE — Progress Notes (Signed)
Pharmacy Antibiotic Note  [name] is a 76 y.o. female admitted on 01/27/2022 with K. Pneumoniae bacteremia from acute cholecystitis.  Pharmacy has been consulted for ampicillin/sulbactam dosing.  Today, 02/05/2022 Day #9 antibiotics - previously on piperacillin/tazobactam Renal: Scr worsening WBC improving with repositioning cholecystostomy tube on 12/3 afebrile K pneumonia in blood and gallbladder cultures resistant to ampicillin, susc to others WBC trending down. 34 > 24 > 14  Plan: Base on current renal function, start ampicillin/sulbactam 3gm IV q12h May need to decrease to q24h if renal function continues to decline  Height: '5\' 6"'$  (167.6 cm) Weight: 103 kg (227 lb 1.2 oz) IBW/kg (Calculated) : 59.3  Temp (24hrs), Avg:97.9 F (36.6 C), Min:97.5 F (36.4 C), Max:98.7 F (37.1 C)  Recent Labs  Lab 01/31/22 0508 02/01/22 0420 02/02/22 0500 02/03/22 0430 02/04/22 0814 02/05/22 0542  WBC 47.4* 39.5* 34.3* 24.0*  --  14.5*  CREATININE 2.54* 2.66* 2.86* 3.16* 3.41* 2.99*  LATICACIDVEN  --  1.0  --   --   --   --      Estimated Creatinine Clearance: 19.4 mL/min (A) (by C-G formula based on SCr of 2.99 mg/dL (H)).    No Known Allergies  Antimicrobials this admission: 11/30 Zosyn >> 12/7 12/7 amp/sulb >>  Dose adjustments this admission:  Microbiology results: 11/29 Bcx: BCID 1/4, aerobic, K pneumo (R to amp) 11/30 Bile cx: Abundant K. pneumo R to amp  Thank you for allowing pharmacy to be a part of this patient's care.  Eleonore Chiquito, PharmD, BCPS Work Cell: [phone] 02/05/2022 8:13 AM

## 2022-02-05 NOTE — Plan of Care (Signed)

## 2022-02-05 NOTE — Care Management Important Message (Signed)
Important Message  Patient Details  Name: [name] MRN: [id number] Date of Birth: 1945/05/23   Medicare Important Message Given:  Yes     Dannette Barbara 02/05/2022, 12:36 PM

## 2022-02-05 NOTE — TOC Progression Note (Addendum)
Transition of Care [facility]) - Progression Note    Patient Details  Name: [name] MRN: [id number] Date of Birth: [birth year]  Transition of [facility]) CM/SW [place], RN Phone Number: 02/05/2022, 9:58 AM  Clinical Narrative:   Spoke with the patient and notified her that  [place] has accepted the patient for [facility] services Provided [employer] for meal delivery phone number and added to the DC AVS She stated that she has no additional needs   Expected Discharge Plan: [place] Barriers to Discharge: Continued Medical Work up  Expected Discharge Plan and Services Expected Discharge Plan: [place]   Discharge Planning Services: CM Consult Post Acute Care Choice: [place] arrangements for the past 2 months: Single Family Home                 DME Arranged: N/A DME Agency: NA       HH Arranged: PT HH Agency: [place] Date [place]: 02/04/22 Time [place]: 1520 Representative spoke with at [place]: [place]   Social Determinants of Health ([place]) Interventions    Readmission Risk Interventions     No data to display

## 2022-02-05 NOTE — Progress Notes (Addendum)
[facility]  ROUNDING NOTE   Subjective:   Patient seen sitting up in chair Alert and oriented Friend at bedside Appetite slowly improving, denies nausea and vomiting States she is able to void spontaneously Room air  Creatinine 2.99 Urine output 600 mL overnight    Objective:  Vital signs in last 24 hours:  Temp:  [97.6 F (36.4 C)-98.7 F (37.1 C)] 97.6 F (36.4 C) (12/08 0800) Pulse Rate:  [46-52] 52 (12/08 0800) Resp:  [17-20] 17 (12/08 0800) BP: (131-148)/(55-59) 148/59 (12/08 0800) SpO2:  [97 %-100 %] 97 % (12/08 0800)  Weight change:  Filed Weights   01/31/22 0500 01/31/22 1205 02/04/22 0500  Weight: 109.6 kg 107.4 kg 103 kg    Intake/Output: I/O last 3 completed shifts: In: 697 [Blood:482; Other:15; IV Piggyback:200] Out: 690 [Urine:675; Drains:15]   Intake/Output this shift:  Total I/O In: -  Out: 400 [Urine:400]  Physical Exam: General: NAD  Head: Normocephalic, atraumatic.  Dry oral mucosal membranes  Eyes: Anicteric  Lungs:  Clear to auscultation, normal effort  Heart: Regular rate and rhythm  Abdomen:  Soft, LLQ tender, drain in place  Extremities: Trace peripheral edema.  Neurologic: Nonfocal, moving all four extremities  Skin: No lesions  Access: None    Basic Metabolic Panel: Recent Labs  Lab 02/01/22 0420 02/02/22 0500 02/03/22 0430 02/04/22 0814 02/05/22 0542  NA 127* 128* 128* 130* 130*  K 3.9 4.6 3.9 3.8 3.2*  CL 103 102 105 106 108  CO2 13* 14* 17* 16* 17*  GLUCOSE 217* 228* 227* 232* 231*  BUN 43* 42* 48* 50* 50*  CREATININE 2.66* 2.86* 3.16* 3.41* 2.99*  CALCIUM 6.7* 6.9* 6.6* 6.9* 7.0*  MG  --  2.0 2.0  --   --   PHOS  --  6.9* 6.4*  --   --      Liver Function Tests: Recent Labs  Lab 01/31/22 0508 02/01/22 0420  AST 19 21  ALT 11 11  ALKPHOS 96 101  BILITOT 1.7* 1.5*  PROT 5.1* 5.1*  ALBUMIN 1.7* 1.6*    No results for input(s): "LIPASE", "AMYLASE" in the last 168 hours. No results for  input(s): "AMMONIA" in the last 168 hours.  CBC: Recent Labs  Lab 01/31/22 0508 02/01/22 0420 02/02/22 0500 02/03/22 0430 02/04/22 0814 02/05/22 0542  WBC 47.4* 39.5* 34.3* 24.0*  --  14.5*  NEUTROABS  --   --   --  19.7*  --   --   HGB 8.6* 8.4* 8.4* 7.5* 7.5* 8.5*  HCT 27.7* 26.8* 26.6* 23.1*  --  26.0*  MCV 95.2 92.7 92.7 90.9  --  88.4  PLT 205 266 252 238  --  274     Cardiac Enzymes: No results for input(s): "CKTOTAL", "CKMB", "CKMBINDEX", "TROPONINI" in the last 168 hours.  BNP: Invalid input(s): "POCBNP"  CBG: Recent Labs  Lab 02/04/22 1154 02/04/22 1718 02/04/22 2151 02/05/22 0825 02/05/22 1211  GLUCAP 266* 276* 252* 185* 207*     Microbiology: Results for orders placed or performed during the hospital encounter of 01/27/22  Blood culture (routine x 2)     Status: Abnormal   Collection Time: 01/27/22 10:48 AM   Specimen: BLOOD  Result Value Ref Range Status   Specimen Description   Final    BLOOD BLOOD LEFT ARM Performed at [facility], [address] [place], [place] [address]    Special Requests   Final    BOTTLES DRAWN AEROBIC AND ANAEROBIC  Blood Culture results may not be optimal due to an excessive volume of blood received in culture bottles Performed at [facility], [address] [place], [place] [address]    Culture  Setup Time   Final    GRAM NEGATIVE RODS AEROBIC BOTTLE ONLY CRITICAL RESULT CALLED TO, READ BACK BY AND VERIFIED WITH: Loura Back PHARMD 0800 01/28/22 HNM Performed at [facility], [place] [address] [place], [place] [address]    Culture KLEBSIELLA PNEUMONIAE (A)  Final   Report Status 01/30/2022 FINAL  Final   Organism ID, Bacteria KLEBSIELLA PNEUMONIAE  Final      Susceptibility   Klebsiella pneumoniae - MIC*    AMPICILLIN >=32 RESISTANT Resistant     CEFAZOLIN <=4 SENSITIVE Sensitive     CEFEPIME <=0.12 SENSITIVE Sensitive     CEFTAZIDIME <=1 SENSITIVE Sensitive     CEFTRIAXONE  <=0.25 SENSITIVE Sensitive     CIPROFLOXACIN <=0.25 SENSITIVE Sensitive     GENTAMICIN <=1 SENSITIVE Sensitive     IMIPENEM <=0.25 SENSITIVE Sensitive     TRIMETH/SULFA <=20 SENSITIVE Sensitive     AMPICILLIN/SULBACTAM 4 SENSITIVE Sensitive     PIP/TAZO <=4 SENSITIVE Sensitive     * KLEBSIELLA PNEUMONIAE  Resp Panel by RT-PCR (Flu A&B, Covid) Urine, Clean Catch     Status: None   Collection Time: 01/27/22 10:48 AM   Specimen: Urine, Clean Catch; Nasal Swab  Result Value Ref Range Status   SARS Coronavirus 2 by RT PCR NEGATIVE NEGATIVE Final    Comment: (NOTE) SARS-CoV-2 target nucleic acids are NOT DETECTED.  The SARS-CoV-2 RNA is generally detectable in upper respiratory specimens during the acute phase of infection. The lowest concentration of SARS-CoV-2 viral copies this assay can detect is 138 copies/mL. A negative result does not preclude SARS-Cov-2 infection and should not be used as the sole basis for treatment or other patient management decisions. A negative result may occur with  improper specimen collection/handling, submission of specimen other than nasopharyngeal swab, presence of viral mutation(s) within the areas targeted by this assay, and inadequate number of viral copies(<138 copies/mL). A negative result must be combined with clinical observations, patient history, and epidemiological information. The expected result is Negative.  Fact Sheet for Patients:  EntrepreneurPulse.com.au  Fact Sheet for Healthcare Providers:  IncredibleEmployment.be  This test is no t yet approved or cleared by the [place] FDA and  has been authorized for detection and/or diagnosis of SARS-CoV-2 by FDA under an Emergency Use Authorization (EUA). This EUA will remain  in effect (meaning this test can be used) for the duration of the COVID-19 declaration under Section 564(b)(1) of the Act, 21 U.S.C.section 360bbb-3(b)(1), unless the  authorization is terminated  or revoked sooner.       Influenza A by PCR NEGATIVE NEGATIVE Final   Influenza B by PCR NEGATIVE NEGATIVE Final    Comment: (NOTE) The Xpert Xpress SARS-CoV-2/FLU/RSV plus assay is intended as an aid in the diagnosis of influenza from Nasopharyngeal swab specimens and should not be used as a sole basis for treatment. Nasal washings and aspirates are unacceptable for Xpert Xpress SARS-CoV-2/FLU/RSV testing.  Fact Sheet for Patients: EntrepreneurPulse.com.au  Fact Sheet for Healthcare Providers: IncredibleEmployment.be  This test is not yet approved or cleared by the [place] FDA and has been authorized for detection and/or diagnosis of SARS-CoV-2 by FDA under an Emergency Use Authorization (EUA). This EUA will remain in effect (meaning this test can be used) for the duration of the  COVID-19 declaration under Section 564(b)(1) of the Act, 21 U.S.C. section 360bbb-3(b)(1), unless the authorization is terminated or revoked.  Performed at [facility], [address] [place], [place] [address]   Blood Culture ID Panel (Reflexed)     Status: Abnormal   Collection Time: 01/27/22 10:48 AM  Result Value Ref Range Status   Enterococcus faecalis NOT DETECTED NOT DETECTED Final   Enterococcus Faecium NOT DETECTED NOT DETECTED Final   Listeria monocytogenes NOT DETECTED NOT DETECTED Final   Staphylococcus species NOT DETECTED NOT DETECTED Final   Staphylococcus aureus (BCID) NOT DETECTED NOT DETECTED Final   Staphylococcus epidermidis NOT DETECTED NOT DETECTED Final   Staphylococcus lugdunensis NOT DETECTED NOT DETECTED Final   Streptococcus species NOT DETECTED NOT DETECTED Final   Streptococcus agalactiae NOT DETECTED NOT DETECTED Final   Streptococcus pneumoniae NOT DETECTED NOT DETECTED Final   Streptococcus pyogenes NOT DETECTED NOT DETECTED Final   A.calcoaceticus-baumannii NOT DETECTED NOT DETECTED  Final   Bacteroides fragilis NOT DETECTED NOT DETECTED Final   Enterobacterales DETECTED (A) NOT DETECTED Final    Comment: Enterobacterales represent a large order of gram negative bacteria, not a single organism. CRITICAL RESULT CALLED TO, READ BACK BY AND VERIFIED WITH: GREGORY GREENWOOD PHARMD 0800 01/28/22 HNM    Enterobacter cloacae complex NOT DETECTED NOT DETECTED Final   Escherichia coli NOT DETECTED NOT DETECTED Final   Klebsiella aerogenes NOT DETECTED NOT DETECTED Final   Klebsiella oxytoca NOT DETECTED NOT DETECTED Final   Klebsiella pneumoniae DETECTED (A) NOT DETECTED Final    Comment: CRITICAL RESULT CALLED TO, READ BACK BY AND VERIFIED WITH: GREGORY GREENWOOD PHARMD 0800 01/28/22 HNM    Proteus species NOT DETECTED NOT DETECTED Final   Salmonella species NOT DETECTED NOT DETECTED Final   Serratia marcescens NOT DETECTED NOT DETECTED Final   Haemophilus influenzae NOT DETECTED NOT DETECTED Final   Neisseria meningitidis NOT DETECTED NOT DETECTED Final   Pseudomonas aeruginosa NOT DETECTED NOT DETECTED Final   Stenotrophomonas maltophilia NOT DETECTED NOT DETECTED Final   Candida albicans NOT DETECTED NOT DETECTED Final   Candida auris NOT DETECTED NOT DETECTED Final   Candida glabrata NOT DETECTED NOT DETECTED Final   Candida krusei NOT DETECTED NOT DETECTED Final   Candida parapsilosis NOT DETECTED NOT DETECTED Final   Candida tropicalis NOT DETECTED NOT DETECTED Final   Cryptococcus neoformans/gattii NOT DETECTED NOT DETECTED Final   CTX-M ESBL NOT DETECTED NOT DETECTED Final   Carbapenem resistance IMP NOT DETECTED NOT DETECTED Final   Carbapenem resistance KPC NOT DETECTED NOT DETECTED Final   Carbapenem resistance NDM NOT DETECTED NOT DETECTED Final   Carbapenem resist OXA 48 LIKE NOT DETECTED NOT DETECTED Final   Carbapenem resistance VIM NOT DETECTED NOT DETECTED Final    Comment: Performed at [facility], [address], [place], [place] [address]   Urine Culture     Status: None   Collection Time: 01/27/22 11:03 AM   Specimen: Urine, Clean Catch  Result Value Ref Range Status   Specimen Description   Final    URINE, CLEAN CATCH Performed at [facility], [address] [place], [place] [address]    Special Requests   Final    NONE Performed at [facility], [address] [place], [place] [address]    Culture   Final    NO GROWTH Performed at [facility], [address], [place], [place] [address]    Report Status 01/28/2022 FINAL  Final  Blood culture (routine x 2)     Status: None   Collection Time: 01/27/22 11:30 AM   Specimen: BLOOD  Result Value Ref Range Status   Specimen Description BLOOD RIGHT ANTECUBITAL  Final   Special Requests   Final    BOTTLES DRAWN AEROBIC AND ANAEROBIC Blood Culture adequate volume   Culture   Final    NO GROWTH 5 DAYS Performed at [facility], [address] [place], [place] [address]    Report Status 02/01/2022 FINAL  Final  Aerobic/Anaerobic Culture w Gram Stain (surgical/deep wound)     Status: None   Collection Time: 01/28/22 10:23 AM   Specimen: Gallbladder; Bile  Result Value Ref Range Status   Specimen Description   Final    GALL BLADDER Performed at [facility], [address] [place], [place] [address]    Special Requests   Final    NONE Performed at [facility], [address] [place], [place] [address]    Gram Stain   Final    FEW WBC PRESENT, PREDOMINANTLY PMN FEW GRAM NEGATIVE RODS    Culture   Final    ABUNDANT KLEBSIELLA PNEUMONIAE NO ANAEROBES ISOLATED Performed at [facility], [place] [address], [place], [place] [address]    Report Status 02/02/2022 FINAL  Final   Organism ID, Bacteria KLEBSIELLA PNEUMONIAE  Final      Susceptibility   Klebsiella pneumoniae - MIC*    AMPICILLIN >=32 RESISTANT Resistant     CEFAZOLIN <=4 SENSITIVE Sensitive     CEFEPIME <=0.12 SENSITIVE Sensitive      CEFTAZIDIME <=1 SENSITIVE Sensitive     CEFTRIAXONE <=0.25 SENSITIVE Sensitive     CIPROFLOXACIN <=0.25 SENSITIVE Sensitive     GENTAMICIN <=1 SENSITIVE Sensitive     IMIPENEM <=0.25 SENSITIVE Sensitive     TRIMETH/SULFA <=20 SENSITIVE Sensitive     AMPICILLIN/SULBACTAM 8 SENSITIVE Sensitive     PIP/TAZO <=4 SENSITIVE Sensitive     * ABUNDANT KLEBSIELLA PNEUMONIAE  MRSA Next Gen by PCR, Nasal     Status: None   Collection Time: 01/31/22 12:05 PM   Specimen: Nasal Mucosa; Nasal Swab  Result Value Ref Range Status   MRSA by PCR Next Gen NOT DETECTED NOT DETECTED Final    Comment: (NOTE) The GeneXpert MRSA Assay (FDA approved for NASAL specimens only), is one component of a comprehensive MRSA colonization surveillance program. It is not intended to diagnose MRSA infection nor to guide or monitor treatment for MRSA infections. Test performance is not FDA approved in patients less than 63 years old. Performed at [facility], [address] [place], [place] [address]     Coagulation Studies: No results for input(s): "LABPROT", "INR" in the last 72 hours.  Urinalysis: No results for input(s): "COLORURINE", "LABSPEC", "PHURINE", "GLUCOSEU", "HGBUR", "BILIRUBINUR", "KETONESUR", "PROTEINUR", "UROBILINOGEN", "NITRITE", "LEUKOCYTESUR" in the last 72 hours.  Invalid input(s): "APPERANCEUR"    Imaging: No results found.   Medications:    sodium chloride Stopped (01/31/22 2120)   albumin human     ampicillin-sulbactam (UNASYN) IV 3 g (02/05/22 0933)    apixaban  5 mg Oral BID   aspirin EC  81 mg Oral Daily   calcium carbonate  2 tablet Oral BID   ezetimibe  10 mg Oral Daily   famotidine  20 mg Oral QHS   FLUoxetine  10 mg Oral Daily   insulin aspart  0-5 Units Subcutaneous QHS   insulin aspart  0-9 Units  Subcutaneous TID WC   insulin aspart  2 Units Subcutaneous TID WC   insulin glargine-yfgn  5 Units Subcutaneous QHS   levothyroxine  125 mcg Oral Q0600    multivitamin with minerals  1 tablet Oral Daily   polyethylene glycol  17 g Oral Daily   Ensure Max Protein  11 oz Oral BID   rosuvastatin  20 mg Oral Daily   sodium bicarbonate  1,300 mg Oral BID   sodium chloride flush  5 mL Intracatheter Q8H   acetaminophen, ALPRAZolam, bisacodyl, HYDROmorphone (DILAUDID) injection, levalbuterol, nitroGLYCERIN, ondansetron (ZOFRAN) IV, ondansetron, traMADol  Assessment/ Plan:  [name] is a 76 y.o.  female  with medical problems of  DM-2, CAD, non-STEMI, hypertension, hyperlipidemia, hypothyroidism, chronic diastolic CHF, thrombocytopenia, major depressive disorder in remission, obstructive sleep apnea, portal vein thrombosis, obesity, acute left posterior tibial vein DVT,  was admitted on 01/27/2022 for Cholecystitis [K81.9] NSTEMI (non-ST elevated myocardial infarction) ([place]) [I21.4] Fever, unspecified fever cause [R50.9]   Acute kidney injury, acute metabolic acidosis nongap, Sepsis Likely ATN due to multifactorial etiology from hypotension, Klebsiella sepsis, IV contrast exposure, IV Toradol (nonsteroidals) Serum mean trends are worsening.  Baseline creatinine of 0.9, GFR greater than 60 from 12/11/2020.  Lab Results  Component Value Date   CREATININE 2.99 (H) 02/05/2022   CREATININE 3.41 (H) 02/04/2022   CREATININE 3.16 (H) 02/03/2022    Intake/Output Summary (Last 24 hours) at 02/05/2022 1415 Last data filed at 02/05/2022 0850 Gross per 24 hour  Intake 682 ml  Output 1000 ml  Net -318 ml    Creatinine has began to improve.  Urine output has also increased, patient has become voiding.  Lower extremity edema remains however slightly improved.  No acute need for dialysis, appears renal recovery in progress.  Remains on antibiotics for infection, managed by primary team.  Sodium and potassium both decreased.  Oral potassium supplementation ordered.  These should improve with renal function.  Calcium also remains decreased however  patient started on calcium carbonate 2 tablets twice daily.  She will need follow-up in our office at discharge.    LOS: [place] 12/8/20232:15 PM

## 2022-02-05 NOTE — Progress Notes (Signed)
Referring Physician(s): Dr. Lysle Pearl  Supervising Physician: Juliet Rude  Patient Status:  [facility] : [facility] - In-pt  Chief Complaint: Acute cholecystitis s/p percutaneous cholecystostomy in IR 01/28/22 and drain exchange/reposition 01/31/22  Subjective: Patient sitting up in the chair, no apparent discomfort or distress observed. Patient's daughter and another female visitor at the bedside.   Allergies: Patient has no known allergies.  Medications: Prior to Admission medications   Medication Sig Start Date End Date Taking? Authorizing Provider  acetaminophen (TYLENOL) 650 MG CR tablet Take 1,950 mg by mouth daily.   Yes [provider]  amLODipine (NORVASC) 2.5 MG tablet TAKE 1 TABLET BY MOUTH DAILY 06/16/21  Yes Gollan, Kathlene November, MD  aspirin EC (ASPIRIN 81) 81 MG tablet Take 81 mg by mouth daily.   Yes [provider]  bisacodyl (DULCOLAX) 5 MG EC tablet Take 2 tablets (10 mg total) by mouth daily as needed for moderate constipation. 12/11/21  Yes [place] Park, MD  carvedilol (COREG) 3.125 MG tablet Take 3.125 mg by mouth daily.   Yes [provider]  docusate sodium (COLACE) 100 MG capsule Take 1 capsule (100 mg total) by mouth 2 (two) times daily. 12/11/21  Yes Thornton [place], MD  ezetimibe (ZETIA) 10 MG tablet TAKE 1 TABLET BY MOUTH DAILY 07/02/21  Yes Gollan, Kathlene November, MD  FLUoxetine (PROZAC) 10 MG capsule TAKE 1 CAPSULE BY MOUTH DAILY 12/07/21  Yes Karamalegos, Devonne Doughty, DO  fluticasone (FLONASE) 50 MCG/ACT nasal spray Place 2 sprays into both nostrils daily. Patient taking differently: Place 2-3 sprays into both nostrils at bedtime as needed. 02/18/21  Yes Brunetta Jeans, PA-C  levothyroxine (SYNTHROID) 125 MCG tablet TAKE 1 TABLET BY MOUTH  DAILY BEFORE BREAKFAST 09/29/21  Yes Karamalegos, Devonne Doughty, DO  losartan (COZAAR) 100 MG tablet TAKE 1 TABLET BY MOUTH DAILY 08/17/21  Yes Gollan, Kathlene November, MD  metFORMIN (GLUCOPHAGE-XR) 500 MG 24 hr tablet TAKE  3 TABLETS BY MOUTH DAILY  WITH BREAKFAST 01/08/22  Yes Karamalegos, Alexander J, DO  ondansetron (ZOFRAN-ODT) 4 MG disintegrating tablet Take 4 mg by mouth every 8 (eight) hours as needed. 01/23/22  Yes [provider]  OZEMPIC, 1 MG/DOSE, 4 MG/3ML SOPN INJECT SUBCUTANEOUSLY 1 MG  WEEKLY 01/18/22  Yes Karamalegos, Alexander J, DO  rosuvastatin (CRESTOR) 20 MG tablet TAKE 1 TABLET BY MOUTH DAILY 07/28/21  Yes Gollan, Kathlene November, MD  traMADol (ULTRAM) 50 MG tablet Take 50 mg by mouth every 6 (six) hours as needed. 12/23/21  Yes [provider]  [place] test strip U UTD BID Patient not taking: Reported on 01/26/2022 11/10/16   Olin Hauser, DO  carvedilol (COREG) 3.125 MG tablet TAKE 1 TABLET BY MOUTH TWICE  DAILY Patient not taking: Reported on 01/27/2022 07/28/21   Minna Merritts, MD  fexofenadine (ALLEGRA) 180 MG tablet Take 180 mg by mouth daily as needed for allergies or rhinitis. Patient not taking: Reported on 11/30/2021    [provider]  nitroGLYCERIN (NITROSTAT) 0.4 MG SL tablet Place 1 tablet (0.4 mg total) under the tongue every 5 (five) minutes as needed for chest pain. 01/29/20   Minna Merritts, MD  oxyCODONE (OXY IR/ROXICODONE) 5 MG immediate release tablet Take 1-2 tablets (5-10 mg total) by mouth every 4 (four) hours as needed for moderate pain (pain score 4-6). Patient not taking: Reported on 01/26/2022 12/11/21   Thornton Park, MD     Vital Signs: BP (!) 148/59 (BP Location: Left Arm)  Pulse (!) 52   Temp 97.6 F (36.4 C)   Resp 17   Ht '5\' 6"'$  (1.676 m)   Wt 227 lb 1.2 oz (103 kg)   SpO2 97%   BMI 36.65 kg/m   Physical Exam Constitutional:      General: She is not in acute distress. Pulmonary:     Effort: Pulmonary effort is normal.  Abdominal:     Palpations: Abdomen is soft.     Tenderness: There is no abdominal tenderness.     Comments: RUQ drain to gravity. Approximately 10 ml of bloody fluid in bag.  Drain easily flushed with 10 ml NS. Dressing is clean/dry and intact  Skin:    General: Skin is warm and dry.  Neurological:     Mental Status: She is alert and oriented to person, place, and time.     Imaging: No results found.  Labs:  CBC: Recent Labs    02/01/22 0420 02/02/22 0500 02/03/22 0430 02/04/22 0814 02/05/22 0542  WBC 39.5* 34.3* 24.0*  --  14.5*  HGB 8.4* 8.4* 7.5* 7.5* 8.5*  HCT 26.8* 26.6* 23.1*  --  26.0*  PLT 266 252 238  --  274    COAGS: Recent Labs    11/30/21 1000 01/27/22 1446  INR 1.1 1.1  APTT  --  42*    BMP: Recent Labs    02/02/22 0500 02/03/22 0430 02/04/22 0814 02/05/22 0542  NA 128* 128* 130* 130*  K 4.6 3.9 3.8 3.2*  CL 102 105 106 108  CO2 14* 17* 16* 17*  GLUCOSE 228* 227* 232* 231*  BUN 42* 48* 50* 50*  CALCIUM 6.9* 6.6* 6.9* 7.0*  CREATININE 2.86* 3.16* 3.41* 2.99*  GFRNONAA 17* 15* 13* 16*    LIVER FUNCTION TESTS: Recent Labs    01/27/22 0942 01/31/22 0508 02/01/22 0420  BILITOT 0.6 1.7* 1.5*  AST 35 19 21  ALT '20 11 11  '$ ALKPHOS 115 96 101  PROT 6.0* 5.1* 5.1*  ALBUMIN 2.3* 1.7* 1.6*    Assessment and Plan:  Acute cholecystitis s/p percutaneous cholecystostomy in IR 01/28/22 and drain reposition 01/31/22  Drain Location: RUQ Size: Fr size: 10 Fr Date of placement: 01/31/22 Currently to: Drain collection device: gravity 24 hour output:  Output by Drain (mL) 02/03/22 0700 - 02/03/22 1459 02/03/22 1500 - 02/03/22 2259 02/03/22 2300 - 02/04/22 0659 02/04/22 0700 - 02/04/22 1459 02/04/22 1500 - 02/04/22 2259 02/04/22 2300 - 02/05/22 0659 02/05/22 0700 - 02/05/22 1312  Biliary Tube Cook slip-coat 10.2 Fr. RUQ  '25 10 5       '$ Interval imaging/drain manipulation:  None  Current examination: Flushes/aspirates easily.  Insertion site unremarkable. Suture and stat lock in place. Dressed appropriately.   Plan: Continue TID flushes with 5 cc NS. Record output Q shift. Dressing changes QD or PRN if soiled.   Call IR APP or on call IR MD if difficulty flushing or sudden change in drain output.  Repeat imaging/possible drain injection once output < 10 mL/QD (excluding flush material). Consideration for drain removal if output is < 10 mL/QD (excluding flush material), pending discussion with the providing surgical service.  Discharge planning: Percutaneous cholecystostomy drain to remain in place at least 6 weeks. Recommend fluoroscopy with injection of the drain in IR to evaluate for patency of the cystic duct. If the duct is patent and general surgery feels patient is stable for cholecystectomy, the drain would be removed at time of surgery. If the duct is patent  and general surgery feels patient is NEVER a candidate for cholecystectomy, drain can be capped for a trial. If symptoms recur, then place to gravity bag again. If trial is successful, discuss possible removal of the drain.  An outpatient order has been placed for the patient to follow up with IR in 6-8 weeks. A scheduler from our office will call her with a date/time. A prescription for NS flushes has been sent in to her pharmacy. I did bedside drain care teaching with the patient and her daughter. Her daughter was able to demonstrate how to flush the drain. I also gave them a handful of saline flushes in case her pharmacy doesn't carry them in stock. They know the site needs to stay clean and dry. The patient and her daughter know they can call our office any time with any questions/concerns.   IR will continue to follow - please call with questions or concerns.  Electronically Signed: Soyla Dryer, AGACNP-BC [phone] 02/05/2022, 1:07 PM   I spent a total of 15 Minutes at the the patient's bedside AND on the patient's hospital floor or unit, greater than 50% of which was counseling/coordinating care for percutaneous cholecystostomy

## 2022-02-05 NOTE — Discharge Instructions (Signed)
Meal delivery free of charge, [employer] [phone]

## 2022-02-06 DIAGNOSIS — A419 Sepsis, unspecified organism: Secondary | ICD-10-CM | POA: Diagnosis not present

## 2022-02-06 DIAGNOSIS — N179 Acute kidney failure, unspecified: Secondary | ICD-10-CM | POA: Diagnosis not present

## 2022-02-06 DIAGNOSIS — K819 Cholecystitis, unspecified: Secondary | ICD-10-CM | POA: Diagnosis not present

## 2022-02-06 DIAGNOSIS — I214 Non-ST elevation (NSTEMI) myocardial infarction: Secondary | ICD-10-CM | POA: Diagnosis not present

## 2022-02-06 LAB — CBC
HCT: 26.2 % — ABNORMAL LOW (ref 36.0–46.0)
Hemoglobin: 8.7 g/dL — ABNORMAL LOW (ref 12.0–15.0)
MCH: 29.9 pg (ref 26.0–34.0)
MCHC: 33.2 g/dL (ref 30.0–36.0)
MCV: 90 fL (ref 80.0–100.0)
Platelets: 303 10*3/uL (ref 150–400)
RBC: 2.91 MIL/uL — ABNORMAL LOW (ref 3.87–5.11)
RDW: 15 % (ref 11.5–15.5)
WBC: 18.4 10*3/uL — ABNORMAL HIGH (ref 4.0–10.5)
nRBC: 0 % (ref 0.0–0.2)

## 2022-02-06 LAB — GLUCOSE, CAPILLARY: Glucose-Capillary: 119 mg/dL — ABNORMAL HIGH (ref 70–99)

## 2022-02-06 LAB — BASIC METABOLIC PANEL
Anion gap: 3 — ABNORMAL LOW (ref 5–15)
BUN: 39 mg/dL — ABNORMAL HIGH (ref 8–23)
CO2: 20 mmol/L — ABNORMAL LOW (ref 22–32)
Calcium: 7.4 mg/dL — ABNORMAL LOW (ref 8.9–10.3)
Chloride: 113 mmol/L — ABNORMAL HIGH (ref 98–111)
Creatinine, Ser: 2.13 mg/dL — ABNORMAL HIGH (ref 0.44–1.00)
GFR, Estimated: 24 mL/min — ABNORMAL LOW (ref >60)
Glucose, Bld: 157 mg/dL — ABNORMAL HIGH (ref 70–99)
Potassium: 3.5 mmol/L (ref 3.5–5.1)
Sodium: 136 mmol/L (ref 135–145)

## 2022-02-06 MED ORDER — AMOXICILLIN-POT CLAVULANATE 875-125 MG PO TABS
1.0000 | ORAL_TABLET | Freq: Two times a day (BID) | ORAL | Status: DC
  Administered 2022-02-06: 1 via ORAL
  Filled 2022-02-06: qty 1

## 2022-02-06 MED ORDER — SODIUM BICARBONATE 650 MG PO TABS: 1300.0000 mg | ORAL_TABLET | Freq: Every day | ORAL | 0 refills | Status: DC

## 2022-02-06 MED ORDER — METOPROLOL SUCCINATE ER 25 MG PO TB24: 12.5000 mg | ORAL_TABLET | Freq: Every day | ORAL | 0 refills | Status: DC

## 2022-02-06 MED ORDER — FLUCONAZOLE 150 MG PO TABS: 150.0000 mg | ORAL_TABLET | Freq: Once | ORAL | 0 refills | Status: AC

## 2022-02-06 MED ORDER — ADULT MULTIVITAMIN W/MINERALS CH: 1.0000 | ORAL_TABLET | Freq: Every day | ORAL | Status: DC

## 2022-02-06 MED ORDER — AMOXICILLIN-POT CLAVULANATE 875-125 MG PO TABS: 1.0000 | ORAL_TABLET | Freq: Two times a day (BID) | ORAL | 0 refills | Status: AC

## 2022-02-06 MED ORDER — APIXABAN 5 MG PO TABS: 5.0000 mg | ORAL_TABLET | Freq: Two times a day (BID) | ORAL | 0 refills | Status: DC

## 2022-02-06 MED ORDER — AMOXICILLIN-POT CLAVULANATE 500-125 MG PO TABS
1.0000 | ORAL_TABLET | Freq: Two times a day (BID) | ORAL | Status: DC
  Filled 2022-02-06: qty 1

## 2022-02-06 MED ORDER — CALCIUM CARBONATE ANTACID 500 MG PO CHEW: 2.0000 | CHEWABLE_TABLET | Freq: Two times a day (BID) | ORAL | 0 refills | Status: DC

## 2022-02-06 MED ORDER — LINAGLIPTIN 5 MG PO TABS: 5.0000 mg | ORAL_TABLET | Freq: Every day | ORAL | 0 refills | Status: DC

## 2022-02-06 MED ORDER — POLYETHYLENE GLYCOL 3350 17 G PO PACK: 17.0000 g | PACK | Freq: Every day | ORAL | 0 refills | Status: DC | PRN

## 2022-02-06 MED ORDER — AMOXICILLIN-POT CLAVULANATE 875-125 MG PO TABS: 1.0000 | ORAL_TABLET | Freq: Two times a day (BID) | ORAL | Status: DC

## 2022-02-06 MED ORDER — FLUCONAZOLE 50 MG PO TABS
150.0000 mg | ORAL_TABLET | Freq: Once | ORAL | Status: AC
  Administered 2022-02-06: 150 mg via ORAL
  Filled 2022-02-06: qty 1

## 2022-02-06 NOTE — TOC Progression Note (Addendum)
Transition of Care [facility]) - Progression Note    Patient Details  Name: [name] MRN: [id number] Date of Birth: [birth year]  [facility] CM/SW Contact  Izola Price, RN Phone Number: 02/06/2022, 9:26 AM  Clinical Narrative: 02/06/22:  Eliquis discount coupon provided to patient. Patient indicated to Unit RN that she has Medicare part D in determining if she will be able to pay after the 30 free trial is up. Set up with [facility] service post discharge. [place] orders requested from provider for PT/OT/RN per prior PT and Ed CM recommendations.  Simmie Davies RN CM        Expected Discharge Plan: [place] Barriers to Discharge: Continued Medical Work up  Expected Discharge Plan and Services Expected Discharge Plan: [place]   Discharge Planning Services: CM Consult Post Acute Care Choice: [place] arrangements for the past 2 months: Single Family Home Expected Discharge Date: 02/06/22               DME Arranged: N/A DME Agency: NA       HH Arranged: PT HH Agency: [place] Date [place]: 02/04/22 Time [place]: [id number] Representative spoke with at [place]: [place]   Social Determinants of Health ([place]) Interventions    Readmission Risk Interventions     No data to display

## 2022-02-06 NOTE — Discharge Summary (Signed)
Physician Discharge Summary   Patient: [name] MRN: [id number] DOB: 07-17-1945  Admit date:     01/27/2022  Discharge date: 02/06/22  Discharge Physician: Loletha Grayer   PCP: Olin Hauser, DO   Recommendations at discharge:   Follow-up PCP 5 days Follow-up general surgery Dr. Lysle Pearl 3 weeks Follow-up cardiology Dr. Rockey Situ 2 weeks Follow-up nephrology next week Follow-up with the drain clinic for biliary drain.  Discharge Diagnoses: Principal Problem:   Septic shock ([place]) Active Problems:   AKI (acute kidney injury) ([place])   NSTEMI (non-ST elevated myocardial infarction) ([place])   Iron deficiency anemia   HTN (hypertension)   Hyponatremia   Controlled type 2 diabetes mellitus with diabetic nephropathy (HCC)   Hypothyroidism   Hyperlipidemia associated with type 2 diabetes mellitus (HCC)   CAD (coronary artery disease)   Chronic diastolic CHF (congestive heart failure) (HCC)   Thrombocytopenia (HCC)   Major depressive disorder, recurrent, in remission ([place])   Obstructive sleep apnea on CPAP   Cholecystitis   Chronic kidney disease, stage 3a (HCC)   Portal vein thrombosis   Obesity (BMI 30.0-34.9)   DVT (deep venous thrombosis) ([place])   Fever     Hospital Course: 76 y.o  female with significant PMH of CAD s/p STENT, HLD, DM, Hypothyroidism, OSA, Endometrial cancer, Diastolic CHF, CKD stage III, who presented to the ED with chief complaints of fever, nausea and vomiting found to have NSTEMI ,dilated CBD and acute cholecystitis with large gallstone in the neck of the gallbladder.    ED Course: Initial vital signs showed HR of 72 beats/minute, BP 80/48 mm Hg, the RR 30 breaths/minute, and the oxygen saturation 94% on RA and a temperature of 98.45F (36.9C).  Pertinent Labs/Diagnostics Findings: Chemistry:Na+/ K+: 132/3.2  Glucose:133  BUN/Cr.:45/1.32 CBC: WBC: 6.4 Hgb/Hct: 9.6/30.14 Plts:118 Other Lab findings:   PCT: 12.71  Lactic acid:1.0  COVID PCR:  Negative, Troponin: 865 BNP: 38.4  Imaging:  CT findings as below noted for  portal vein thrombosis, dilated CBD and acute Left DVT.   Hospital Course: Patient admitted to stepdown unit under hospitalist service. Due to findings as above General surgery, GI and Cardiology was consulted. Patient was evaluated by cardiology who with recs to start heparin gtt x 48 hrs and General surgery with recs to obtain MRI and perc chole. Patient underwent image-guided percutaneous cholecystostomy tube placement. Patient developed worsening pain with increased WBC, and hypotension. Repeat CT showed abnormal position of the GB drain. IR consulted and drain replaced. Patient transferred to ICU for pressors. PCCM consulted.  11/29: Admit to stepdown unit with Klebsiella sepsis secondary to acute cholecystitis/gallstones  11/30: Patient underwent image-guided percutaneous cholecystostomy tube placement.  Started on heparin drip for NSTEMI 12/2: Patient with worsening pain and increased white count.  Repeat CT abdomen showed abnormal positioning GB drain 12/3: S/p replacement of new 10 FR percutaneous cholecystectomy tube by IR.  Remained hypotensive transferred to ICU for pressors PCCM consulted 12/14 remains on pressors 12/5 remains on pressors 12/6 Off pressors and transferred out of the ICU 12/7 creatinine continues to rise at 3.41 patient was able to urinate last night and this morning.  With hemoglobin 7.5 will give a transfusion of 1 unit of packed red blood cells.  Change antibiotics from Zosyn over to Unasyn.  Discontinue central line. 12/8.  Creatinine improved to 2.99.  Patient urinating very well.  Hemoglobin up to 8.5 after transfusion. 12/9.  Creatinine down to 2.13, hemoglobin 8.7, stable for discharge.  Assessment  and Plan: * Septic shock ([place]) Present on admission.  Klebsiella growing out of blood cultures.  Percutaneous gallbladder drain placement on 11/30 with revision on 12/3.  Acute cholecystitis  and gallbladder is the cause of the patient's sepsis.  Off pressors since 12/7.  Discontinued stress dose steroids on 12/7.  Discontinued midodrine on 12/8.  Patient was on Zosyn most of the hospital course and switch over to Unasyn.  Will switch to Augmentin and complete total of 14 days of antibiotics.  AKI (acute kidney injury) ([place]) Baseline creatinine 0.93.  Creatinine peaked on 12/7 at 3.41.  Creatinine on 12/8 2.99.  Creatinine 2.13 on the day of discharge.  Nephrology would like to keep sodium bicarb going daily until follow-up appointment.  NSTEMI (non-ST elevated myocardial infarction) ([place]) Secondary to septic shock.  Continue aspirin.  Continue Crestor and Zetia.  Will try half dose of Toprol-XL at night upon discharge since blood pressure and heart rate are better.  Iron deficiency anemia Watch closely while on blood thinners.  Last hemoglobin 8.7.  Received a blood transfusion on 12/7.  Hyponatremia Sodium 136 today.  Hyperlipidemia associated with type 2 diabetes mellitus (HCC) Hemoglobin A1c 6.8.  Continue Crestor.  On sliding scale insulin.  Continue to hold metformin.  Hold Ozempic upon discharge.  Will try low-dose Tradjenta.  Chronic diastolic CHF (congestive heart failure) (HCC) Discontinue fluids.  No signs of heart failure currently.  Last EF 60%.  Status post a dose of albumin on 02/05/2022.  Thrombocytopenia ([place]) Resolved.  Platelet count normal range.  Obstructive sleep apnea on CPAP CPAP at night  Obesity (BMI 30.0-34.9) BMI 36.65  DVT (deep venous thrombosis) ([place]) On Eliquis         Consultants: Interventional radiology, general surgery, cardiology, nephrology Procedures performed: Percutaneous gallbladder drain and revision Disposition: Home health Diet recommendation:  Cardiac and Carb modified diet DISCHARGE MEDICATION: Allergies as of 02/06/2022   No Known Allergies      Medication List     STOP taking these medications     Accu-Chek Aviva Plus test strip Generic drug: glucose blood   amLODipine 2.5 MG tablet Commonly known as: NORVASC   carvedilol 3.125 MG tablet Commonly known as: COREG   docusate sodium 100 MG capsule Commonly known as: COLACE   fexofenadine 180 MG tablet Commonly known as: ALLEGRA   losartan 100 MG tablet Commonly known as: COZAAR   metFORMIN 500 MG 24 hr tablet Commonly known as: GLUCOPHAGE-XR   ondansetron 4 MG disintegrating tablet Commonly known as: ZOFRAN-ODT   oxyCODONE 5 MG immediate release tablet Commonly known as: Oxy IR/ROXICODONE   Ozempic (1 MG/DOSE) 4 MG/3ML Sopn Generic drug: Semaglutide (1 MG/DOSE)       TAKE these medications    acetaminophen 650 MG CR tablet Commonly known as: TYLENOL Take 1,950 mg by mouth daily.   amoxicillin-clavulanate 875-125 MG tablet Commonly known as: AUGMENTIN Take 1 tablet by mouth every 12 (twelve) hours for 5 days.   apixaban 5 MG Tabs tablet Commonly known as: ELIQUIS Take 1 tablet (5 mg total) by mouth 2 (two) times daily.   Aspirin 81 81 MG tablet Generic drug: aspirin EC Take 81 mg by mouth daily.   bisacodyl 5 MG EC tablet Commonly known as: DULCOLAX Take 2 tablets (10 mg total) by mouth daily as needed for moderate constipation.   calcium carbonate 500 MG chewable tablet Commonly known as: TUMS - dosed in mg elemental calcium Chew 2 tablets (400 mg of elemental calcium  total) by mouth 2 (two) times daily.   ezetimibe 10 MG tablet Commonly known as: ZETIA TAKE 1 TABLET BY MOUTH DAILY   fluconazole 150 MG tablet Commonly known as: DIFLUCAN Take 1 tablet (150 mg total) by mouth once for 1 dose. Start taking on: February 10, 2022   FLUoxetine 10 MG capsule Commonly known as: PROZAC TAKE 1 CAPSULE BY MOUTH DAILY   fluticasone 50 MCG/ACT nasal spray Commonly known as: FLONASE Place 2 sprays into both nostrils daily. What changed:  how much to take when to take this reasons to take this    levothyroxine 125 MCG tablet Commonly known as: SYNTHROID TAKE 1 TABLET BY MOUTH  DAILY BEFORE BREAKFAST   linagliptin 5 MG Tabs tablet Commonly known as: TRADJENTA Take 1 tablet (5 mg total) by mouth daily.   metoprolol succinate 25 MG 24 hr tablet Commonly known as: Toprol XL Take 0.5 tablets (12.5 mg total) by mouth at bedtime.   multivitamin with minerals Tabs tablet Take 1 tablet by mouth daily.   nitroGLYCERIN 0.4 MG SL tablet Commonly known as: NITROSTAT Place 1 tablet (0.4 mg total) under the tongue every 5 (five) minutes as needed for chest pain.   polyethylene glycol 17 g packet Commonly known as: MIRALAX / GLYCOLAX Take 17 g by mouth daily as needed.   rosuvastatin 20 MG tablet Commonly known as: CRESTOR TAKE 1 TABLET BY MOUTH DAILY   sodium bicarbonate 650 MG tablet Take 2 tablets (1,300 mg total) by mouth daily.   sodium chloride flush 0.9 % Soln injection 10 mLs by Intracatheter route as needed. Flush gallbladder drain with 5-10 ml Normal Saline once daily   traMADol 50 MG tablet Commonly known as: ULTRAM Take 50 mg by mouth every 6 (six) hours as needed.        Follow-up Information     Olin Hauser, DO Follow up in 5 day(s).   Specialty: Family Medicine Contact information: [place] [address] [phone]         Benjamine Sprague, DO Follow up in 3 week(s).   Specialty: Surgery Contact information: [place] [address] [phone]         [facility] Follow up.   Why: Please follow up with Interventional Radiology in 6-8 weeks for a drain evaluation. A scheduler from our office will call you with a date/time of your appointment. Please call our office with any questions/concerns prior to your visit. Contact information: [place] [address] [phone]         Minna Merritts, MD Follow up in 2 week(s).   Specialty: Cardiology Contact  information: [place] [address] [phone]         Murlean Iba, MD Follow up in 1 week(s).   Specialty: Nephrology Contact information: [place] [address] [phone]                Discharge Exam: Danley Danker Weights   01/31/22 0500 01/31/22 1205 02/04/22 0500  Weight: 109.6 kg 107.4 kg 103 kg   Physical Exam HENT:     Head: Normocephalic.     Mouth/Throat:     Pharynx: No oropharyngeal exudate.  Eyes:     General: Lids are normal.     Conjunctiva/sclera: Conjunctivae normal.  Cardiovascular:     Rate and Rhythm: Normal rate and regular rhythm.     Heart sounds: Normal  heart sounds, S1 normal and S2 normal.  Pulmonary:     Breath sounds: No decreased breath sounds, wheezing, rhonchi or rales.  Abdominal:     Palpations: Abdomen is soft.     Tenderness: There is no abdominal tenderness.  Musculoskeletal:     Right forearm: Swelling present.     Left forearm: Swelling present.     Right lower leg: Swelling present.     Left lower leg: Swelling present.  Skin:    General: Skin is warm.     Findings: No rash.  Neurological:     Mental Status: She is alert and oriented to person, place, and time.      Condition at discharge: stable  The results of significant diagnostics from this hospitalization (including imaging, microbiology, ancillary and laboratory) are listed below for reference.   Imaging Studies: DG Chest Port 1 View  Result Date: 02/01/2022 CLINICAL DATA:  Central line placement EXAM: PORTABLE CHEST 1 VIEW COMPARISON:  01/27/2022 FINDINGS: Right central line tip in the upper right atrium. No pneumothorax. Low lung volumes with bibasilar airspace opacities, new since prior study. Heart is normal size. No visible effusion. No acute bony abnormality. IMPRESSION: Right central line tip in the upper right atrium.  No pneumothorax. Bilateral lower lobe airspace opacities. This could  reflect atelectasis or pneumonia. Electronically Signed   By: Rolm Baptise M.D.   On: 02/01/2022 00:19   IR Perc Cholecystostomy  Result Date: 01/31/2022 INDICATION: Dislodged cholecystostomy tube, leukocytosis and worsening right upper quadrant pain EXAM: Placement of percutaneous cholecystostomy tube using ultrasound and fluoroscopic guidance MEDICATIONS: Per EMR ANESTHESIA/SEDATION: Local analgesia FLUOROSCOPY: Radiation Exposure Index (as provided by the fluoroscopic device): 2.0 minutes (42 mGy) COMPLICATIONS: None immediate. PROCEDURE: Informed written consent was obtained from the patient after a thorough discussion of the procedural risks, benefits and alternatives. All questions were addressed. Maximal Sterile Barrier Technique was utilized including caps, mask, sterile gowns, sterile gloves, sterile drape, hand hygiene and skin antiseptic. A timeout was performed prior to the initiation of the procedure. The patient was placed supine on the exam table. The existing cholecystostomy tube was removed, as it was noted to be dislodged base on recent cross-sectional imaging. The right upper quadrant was prepped and draped in the standard sterile fashion. Ultrasound of the right upper quadrant was performed for planning purposes. This again demonstrated a distended gallbladder with wall edema and large gallstone, consistent with acute cholecystitis. An intercostal transhepatic approach was planned. A relatively high intercostal access was used due to known colonic interposition. Skin entry site was marked, and local analgesia was obtained with 1% lidocaine. Under ultrasound guidance, percutaneous access was obtained into the gallbladder via an intercostal transhepatic approach using a 21 gauge Chiba needle. Access was confirmed with visualization of needle tip within the gallbladder lumen, and free return of bile. An 018 Nitrex wire was then advanced through the access needle and coiled within the gallbladder  lumen. A transition dilator was advanced over this wire, through which an antegrade cholecystogram was performed. Antegrade cholecystogram demonstrated appropriate location in the gallbladder lumen with large central gallstone. Over an Amplatz wire, the percutaneous tract was serially dilated followed by placement of a 10 French locking multipurpose drainage catheter into the gallbladder lumen. Locking loop was formed. Additional biliary sludge was drained. Gentle hand injection of contrast material confirmed location of the gallbladder lumen. The drainage catheter was secured to the skin using silk suture and a dressing. It was placed to bag drainage. The  patient tolerated the procedure well without immediate complication. IMPRESSION: Successful placement of a new 10 French percutaneous cholecystostomy tube via intercostal approach. Cholecystostomy tube placed to bag drainage. Electronically Signed   By: Albin Felling M.D.   On: 01/31/2022 12:01   NM Myocar Multi W/Spect W/Wall Motion / EF  Result Date: 01/30/2022   The study is normal. Findings are consistent with no ischemia and no prior myocardial infarction. The study is low risk.   No ST deviation was noted.   LV perfusion is normal. There is no evidence of ischemia. There is no evidence of infarction.   Left ventricular function is normal. Nuclear stress EF: 60 %. The left ventricular ejection fraction is normal (55-65%). End diastolic cavity size is normal. End systolic cavity size is normal.   CT ABDOMEN W CONTRAST  Result Date: 01/30/2022 CLINICAL DATA:  Acute abdominal pain. Cholecystitis. Status post percutaneous cholecystostomy. EXAM: CT ABDOMEN WITH CONTRAST TECHNIQUE: Multidetector CT imaging of the abdomen was performed using the standard protocol following bolus administration of intravenous contrast. RADIATION DOSE REDUCTION: This exam was performed according to the departmental dose-optimization program which includes automated exposure  control, adjustment of the mA and/or kV according to patient size and/or use of iterative reconstruction technique. CONTRAST:  184m OMNIPAQUE IOHEXOL 300 MG/ML  SOLN COMPARISON:  01/27/2022 FINDINGS: Lower chest: Mild dependent atelectasis in both lung bases. Hepatobiliary: No hepatic masses or abscess identified. Thrombosis of the left portal vein is again seen. Large gallstone and diffuse gallbladder wall thickening unchanged in appearance, consistent with acute cholecystitis. Mild biliary ductal dilatation remains stable. A cholecystostomy tube is seen in abnormal position, with the pigtail loop located in the left perihepatic space. Mild perihepatic ascites is noted. Pancreas:  No mass or inflammatory changes. Spleen:  Within normal limits in size and appearance. Adrenals/Urinary Tract: No suspicious masses identified. No evidence of hydronephrosis. Stomach/Bowel: Increased gaseous distention of nondependent transverse colon, consistent with colonic ileus. Vascular/Lymphatic: No pathologically enlarged lymph nodes identified. No acute vascular findings. Aortic atherosclerotic calcification incidentally noted. Other: Mild bilateral upper quadrant ascites. Mild diffuse body wall edema. Musculoskeletal:  No suspicious bone lesions identified. IMPRESSION: Cholecystostomy tube in abnormal position, with the pigtail loop located in the left perihepatic space. Stable findings consistent with acute cholecystitis. Stable mild biliary ductal dilatation. Stable left portal vein thrombosis. New mild ascites and diffuse body wall edema. Mild colonic ileus. Aortic Atherosclerosis (ICD10-I70.0). Electronically Signed   By: JMarlaine HindM.D.   On: 01/30/2022 13:06   ECHOCARDIOGRAM COMPLETE  Result Date: 01/28/2022    ECHOCARDIOGRAM REPORT   Patient Name:   LSKYE PLAMONDONDate of Exam: 01/28/2022 Medical Rec #:  [id number]        Height:       66.0 in Accession #:    [id number]       Weight:       217.2 lb Date of  Birth:  [birth year]         BSA:          2.071 m Patient Age:    89years          BP:           112/47 mmHg Patient Gender: F                 HR:           76 bpm. Exam Location:  [facility] Procedure: 2D Echo, Color Doppler, Cardiac Doppler and Intracardiac  Opacification Agent Indications:     I21.4 NSTEMI  History:         Patient has no prior history of Echocardiogram examinations and                  Patient has prior history of Echocardiogram examinations, most                  recent 11/26/2020. ICM, CAD; Risk Factors:Hypertension,                  Dyslipidemia and Sleep Apnea.  Sonographer:     Charmayne Sheer Referring Phys:  [id number] Kate Sable Diagnosing Phys: Ida Rogue MD  Sonographer Comments: Suboptimal apical window and no subcostal window. Image acquisition challenging due to patient body habitus. IMPRESSIONS  1. Left ventricular ejection fraction, by estimation, is 60 to 65%. The left ventricle has normal function. The left ventricle has no regional wall motion abnormalities. Left ventricular diastolic parameters are consistent with Grade I diastolic dysfunction (impaired relaxation).  2. Right ventricular systolic function is normal. The right ventricular size is normal. Tricuspid regurgitation signal is inadequate for assessing PA pressure.  3. The mitral valve is normal in structure. No evidence of mitral valve regurgitation. No evidence of mitral stenosis.  4. The aortic valve is normal in structure. Aortic valve regurgitation is not visualized. No aortic stenosis is present.  5. The inferior vena cava is normal in size with greater than 50% respiratory variability, suggesting right atrial pressure of 3 mmHg. FINDINGS  Left Ventricle: Left ventricular ejection fraction, by estimation, is 60 to 65%. The left ventricle has normal function. The left ventricle has no regional wall motion abnormalities. Definity contrast agent was given IV to delineate the left ventricular  endocardial  borders. The left ventricular internal cavity size was normal in size. There is no left ventricular hypertrophy. Left ventricular diastolic parameters are consistent with Grade I diastolic dysfunction (impaired relaxation). Right Ventricle: The right ventricular size is normal. No increase in right ventricular wall thickness. Right ventricular systolic function is normal. Tricuspid regurgitation signal is inadequate for assessing PA pressure. Left Atrium: Left atrial size was normal in size. Right Atrium: Right atrial size was normal in size. Pericardium: There is no evidence of pericardial effusion. Mitral Valve: The mitral valve is normal in structure. No evidence of mitral valve regurgitation. No evidence of mitral valve stenosis. Tricuspid Valve: The tricuspid valve is normal in structure. Tricuspid valve regurgitation is not demonstrated. No evidence of tricuspid stenosis. Aortic Valve: The aortic valve is normal in structure. Aortic valve regurgitation is not visualized. No aortic stenosis is present. Aortic valve mean gradient measures 8.0 mmHg. Aortic valve peak gradient measures 12.2 mmHg. Aortic valve area, by VTI measures 2.49 cm. Pulmonic Valve: The pulmonic valve was normal in structure. Pulmonic valve regurgitation is not visualized. No evidence of pulmonic stenosis. Aorta: The aortic root is normal in size and structure. Venous: The inferior vena cava is normal in size with greater than 50% respiratory variability, suggesting right atrial pressure of 3 mmHg. IAS/Shunts: No atrial level shunt detected by color flow Doppler.  LEFT VENTRICLE PLAX 2D LVIDd:         5.10 cm   Diastology LVIDs:         2.80 cm   LV e' medial:    8.27 cm/s LV PW:         1.10 cm   LV E/e' medial:  8.5 LV IVS:  0.90 cm   LV e' lateral:   6.42 cm/s LVOT diam:     2.00 cm   LV E/e' lateral: 10.9 LV SV:         69 LV SV Index:   34 LVOT Area:     3.14 cm  LEFT ATRIUM           Index LA diam:      3.80 cm 1.83 cm/m LA  Vol (A4C): 60.1 ml 29.02 ml/m  AORTIC VALVE                     PULMONIC VALVE AV Area (Vmax):    2.59 cm      PV Vmax:       0.86 m/s AV Area (Vmean):   2.28 cm      PV Vmean:      66.200 cm/s AV Area (VTI):     2.49 cm      PV VTI:        0.136 m AV Vmax:           175.00 cm/s   PV Peak grad:  2.9 mmHg AV Vmean:          137.000 cm/s  PV Mean grad:  2.0 mmHg AV VTI:            0.279 m AV Peak Grad:      12.2 mmHg AV Mean Grad:      8.0 mmHg LVOT Vmax:         144.00 cm/s LVOT Vmean:        99.400 cm/s LVOT VTI:          0.221 m LVOT/AV VTI ratio: 0.79  AORTA Ao Root diam: 2.80 cm MITRAL VALVE MV Area (PHT): 4.77 cm     SHUNTS MV Decel Time: 159 msec     Systemic VTI:  0.22 m MV E velocity: 70.20 cm/s   Systemic Diam: 2.00 cm MV A velocity: 102.00 cm/s MV E/A ratio:  0.69 Ida Rogue MD Electronically signed by Ida Rogue MD Signature Date/Time: 01/28/2022/5:34:28 PM    Final    IR Perc Cholecystostomy  Result Date: 01/28/2022 INDICATION: 76 year old female with a history of EXAM: CHOLECYSTOSTOMY MEDICATIONS: None ANESTHESIA/SEDATION: Moderate (conscious) sedation was employed during this procedure. A total of Versed 0.5 mg and Fentanyl 25 mcg was administered intravenously. Moderate Sedation Time: 12 minutes. The patient's level of consciousness and vital signs were monitored continuously by radiology nursing throughout the procedure under my direct supervision. FLUOROSCOPY TIME:  Fluoroscopy Time: 0 minutes 42 seconds (16 mGy). COMPLICATIONS: None PROCEDURE: Informed written consent was obtained from the patient and the patient's family after a thorough discussion of the procedural risks, benefits and alternatives. All questions were addressed. Maximal Sterile Barrier Technique was utilized including caps, mask, sterile gowns, sterile gloves, sterile drape, hand hygiene and skin antiseptic. A timeout was performed prior to the initiation of the procedure. Ultrasound survey of the right upper  quadrant was performed for planning purposes. Once the patient is prepped and draped in the usual sterile fashion, the skin and subcutaneous tissues overlying the gallbladder were generously infiltrated 1% lidocaine for local anesthesia. A coaxial needle was advanced under ultrasound guidance through the skin subcutaneous tissues and a small segment of liver into the gallbladder lumen. With removal of the stylet, spontaneous dark bile drainage occurred. Using modified Seldinger technique, a 10 French drain was placed into the gallbladder fossa, with aspiration of the sample for  the lab. Contrast injection confirmed position of the tube within the gallbladder lumen. Drainage catheter was attached to gravity drain with a suture retention placed. Patient tolerated the procedure well and remained hemodynamically stable throughout. No complications were encountered and no significant blood loss encountered. IMPRESSION: Status post percutaneous cholecystostomy Signed, Dulcy Fanny. Nadene Rubins, RPVI [facility] Radiology Electronically Signed   By: Corrie Mckusick D.O.   On: 01/28/2022 16:35   MR ABDOMEN MRCP W WO CONTAST  Result Date: 01/27/2022 CLINICAL DATA:  Right upper quadrant abdominal pain EXAM: MRI ABDOMEN WITHOUT AND WITH CONTRAST (INCLUDING MRCP) TECHNIQUE: Multiplanar multisequence MR imaging of the abdomen was performed both before and after the administration of intravenous contrast. Heavily T2-weighted images of the biliary and pancreatic ducts were obtained, and three-dimensional MRCP images were rendered by post processing. CONTRAST:  38m GADAVIST GADOBUTROL 1 MMOL/ML IV SOLN COMPARISON:  CT scan 01/27/2022 FINDINGS: Despite efforts by the technologist and patient, motion artifact is present on today's exam and could not be eliminated. This reduces exam sensitivity and specificity. Lower chest: Unremarkable Hepatobiliary: 0.2 cm gallstone in the  gallbladder with sludge and additional small gallstones noted. There is gallbladder wall thickening as well as accentuated arterial phase enhancement in the hepatic parenchyma adjacent to the gallbladder which can be associated with cholecystitis. Common bile duct 1.0 cm in diameter. I do not see a definite CBD stone, sensitivity adversely affected by motion. There is some patchy and faint accentuated T2 signal in the left hepatic lobe prior to contrast administration. Post-contrast images confirm left portal vein thrombosis for example with substantial filling defect in the left portal vein on image 605 of series 26. The right portal vein, main portal vein, superior mesenteric vein, and splenic vein appear patent. I do not see definite tumor in the liver to indicate that the portal vein thrombosis is from a malignant cause, although clearly motion artifact adversely affects assessment. Pancreas:  Atrophic pancreas, otherwise unremarkable. Spleen:  Unremarkable Adrenals/Urinary Tract: Small benign renal cysts were no further workup. Adrenal glands unremarkable. Stomach/Bowel: Unremarkable Vascular/Lymphatic: Atherosclerosis is present, including aortoiliac atherosclerotic disease. Left portal vein thrombosis as noted above. Other:  No supplemental non-categorized findings. Musculoskeletal: Mild dextroconvex lumbar scoliosis with lumbar spondylosis and degenerative disc disease. IMPRESSION: 1. Acute left portal vein thrombosis. No definite underlying mass is identified, although sensitivity is adversely affected by motion artifact. 2. Cholelithiasis with gallbladder wall thickening and accentuated arterial phase hepatic parenchymal enhancement adjacent to the gallbladder which can be associated with acute cholecystitis. 3. Common bile duct 1.0 cm in diameter, without a definite CBD stone identified. 4. Atrophic pancreas. 5. Lumbar spondylosis and degenerative disc disease. Electronically Signed   By: WVan ClinesM.D.   On: 01/27/2022 21:13   MR 3D Recon At Scanner  Result Date: 01/27/2022 CLINICAL DATA:  Right upper quadrant abdominal pain EXAM: MRI ABDOMEN WITHOUT AND WITH CONTRAST (INCLUDING MRCP) TECHNIQUE: Multiplanar multisequence MR imaging of the abdomen was performed both before and after the administration of intravenous contrast. Heavily T2-weighted images of the biliary and pancreatic ducts were obtained, and three-dimensional MRCP images were rendered by post processing. CONTRAST:  163mGADAVIST GADOBUTROL 1 MMOL/ML IV SOLN COMPARISON:  CT scan 01/27/2022 FINDINGS: Despite efforts by the technologist and patient, motion artifact is present on today's exam and could not be eliminated. This reduces exam sensitivity and specificity. Lower chest: Unremarkable Hepatobiliary: 0.2 cm gallstone in the gallbladder with sludge and additional small gallstones noted.  There is gallbladder wall thickening as well as accentuated arterial phase enhancement in the hepatic parenchyma adjacent to the gallbladder which can be associated with cholecystitis. Common bile duct 1.0 cm in diameter. I do not see a definite CBD stone, sensitivity adversely affected by motion. There is some patchy and faint accentuated T2 signal in the left hepatic lobe prior to contrast administration. Post-contrast images confirm left portal vein thrombosis for example with substantial filling defect in the left portal vein on image 605 of series 26. The right portal vein, main portal vein, superior mesenteric vein, and splenic vein appear patent. I do not see definite tumor in the liver to indicate that the portal vein thrombosis is from a malignant cause, although clearly motion artifact adversely affects assessment. Pancreas:  Atrophic pancreas, otherwise unremarkable. Spleen:  Unremarkable Adrenals/Urinary Tract: Small benign renal cysts were no further workup. Adrenal glands unremarkable. Stomach/Bowel: Unremarkable  Vascular/Lymphatic: Atherosclerosis is present, including aortoiliac atherosclerotic disease. Left portal vein thrombosis as noted above. Other:  No supplemental non-categorized findings. Musculoskeletal: Mild dextroconvex lumbar scoliosis with lumbar spondylosis and degenerative disc disease. IMPRESSION: 1. Acute left portal vein thrombosis. No definite underlying mass is identified, although sensitivity is adversely affected by motion artifact. 2. Cholelithiasis with gallbladder wall thickening and accentuated arterial phase hepatic parenchymal enhancement adjacent to the gallbladder which can be associated with acute cholecystitis. 3. Common bile duct 1.0 cm in diameter, without a definite CBD stone identified. 4. Atrophic pancreas. 5. Lumbar spondylosis and degenerative disc disease. Electronically Signed   By: Van Clines M.D.   On: 01/27/2022 21:13   US Venous Img Lower Bilateral (DVT)  Result Date: 01/27/2022 CLINICAL DATA:  Knee replacements bilaterally in October with positive D-dimer EXAM: BILATERAL LOWER EXTREMITY VENOUS DOPPLER ULTRASOUND TECHNIQUE: Gray-scale sonography with graded compression, as well as color Doppler and duplex ultrasound were performed to evaluate the lower extremity deep venous systems from the level of the common femoral vein and including the common femoral, femoral, profunda femoral, popliteal and calf veins including the posterior tibial, peroneal and gastrocnemius veins when visible. The superficial great saphenous vein was also interrogated. Spectral Doppler was utilized to evaluate flow at rest and with distal augmentation maneuvers in the common femoral, femoral and popliteal veins. COMPARISON:  None Available. FINDINGS: RIGHT LOWER EXTREMITY Common Femoral Vein: No evidence of thrombus. Normal compressibility, respiratory phasicity and response to augmentation. Saphenofemoral Junction: No evidence of thrombus. Normal compressibility and flow on color Doppler  imaging. Profunda Femoral Vein: No evidence of thrombus. Normal compressibility and flow on color Doppler imaging. Femoral Vein: No evidence of thrombus. Normal compressibility, respiratory phasicity and response to augmentation. Popliteal Vein: No evidence of thrombus. Normal compressibility, respiratory phasicity and response to augmentation. Calf Veins: No evidence of thrombus. Normal compressibility and flow on color Doppler imaging. Superficial Great Saphenous Vein: No evidence of thrombus. Normal compressibility. Venous Reflux:  None. Other Findings:  None. LEFT LOWER EXTREMITY Common Femoral Vein: No evidence of thrombus. Normal compressibility, respiratory phasicity and response to augmentation. Saphenofemoral Junction: No evidence of thrombus. Normal compressibility and flow on color Doppler imaging. Profunda Femoral Vein: No evidence of thrombus. Normal compressibility and flow on color Doppler imaging. Femoral Vein: No evidence of thrombus. Normal compressibility, respiratory phasicity and response to augmentation. Popliteal Vein: No evidence of thrombus. Normal compressibility, respiratory phasicity and response to augmentation. Calf Veins: The posterior tibial vein is occluded at the mid calf due to acute DVT. Superficial Great Saphenous Vein: No evidence of thrombus. Normal compressibility.  Venous Reflux:  None. Other Findings:  None. IMPRESSION: Acute occlusive DVT in the left posterior tibial vein at the mid calf. No DVT on the right. These results were called by telephone at the time of interpretation on 01/27/2022 at 6:35 pm to provider Ivor Costa , who verbally acknowledged these results. Electronically Signed   By: Placido Sou M.D.   On: 01/27/2022 18:36   US ABDOMEN LIMITED RUQ (LIVER/GB)  Result Date: 01/27/2022 CLINICAL DATA:  Right upper quadrant abdominal pain EXAM: ULTRASOUND ABDOMEN LIMITED RIGHT UPPER QUADRANT COMPARISON:  CT same day FINDINGS: Gallbladder: Gallbladder wall  thickening up to 9 mm. No Murphy sign. 2.6 cm gallstone at the gallbladder neck. Findings worrisome for acute cholecystitis despite the absence of a Murphy sign. Common bile duct: Diameter: Dilated to 12 mm.  No visible ductal stone. Liver: Normal appearance of the liver parenchyma itself. Portal vein is patent on color Doppler imaging with normal direction of blood flow towards the liver. Other: None. IMPRESSION: 1. 2.6 cm gallstone at the gallbladder neck with gallbladder wall thickening. No Murphy sign. Findings worrisome for acute cholecystitis, despite the absence of a demonstrable Murphy sign. 2. Dilated common bile duct to 12 mm. No visible ductal stone. Electronically Signed   By: Nelson Chimes M.D.   On: 01/27/2022 14:41   CT Angio Chest PE W and/or Wo Contrast  Result Date: 01/27/2022 CLINICAL DATA:  Pulmonary embolism (PE) suspected, high prob; Abdominal pain, acute, nonlocalized EXAM: CT ANGIOGRAPHY CHEST CT ABDOMEN AND PELVIS WITH CONTRAST TECHNIQUE: Multidetector CT imaging of the chest was performed using the standard protocol during bolus administration of intravenous contrast. Multiplanar CT image reconstructions and MIPs were obtained to evaluate the vascular anatomy. Multidetector CT imaging of the abdomen and pelvis was performed using the standard protocol during bolus administration of intravenous contrast. RADIATION DOSE REDUCTION: This exam was performed according to the departmental dose-optimization program which includes automated exposure control, adjustment of the mA and/or kV according to patient size and/or use of iterative reconstruction technique. CONTRAST:  55m OMNIPAQUE IOHEXOL 350 MG/ML SOLN COMPARISON:  None Available. FINDINGS: CTA CHEST FINDINGS Cardiovascular: Satisfactory opacification of the pulmonary arteries to the segmental level. No evidence of pulmonary embolism. Pulmonary trunk measures 3.5 cm in diameter. Thoracic aorta is nonaneurysmal. Scattered atherosclerotic  vascular calcifications of the aorta and coronary arteries. Heart size is upper limits of normal. No pericardial effusion. Mediastinum/Nodes: No enlarged mediastinal, hilar, or axillary lymph nodes. Thyroid gland, trachea, and esophagus demonstrate no significant findings. Lungs/Pleura: Trace layering bilateral pleural effusions. Mild bibasilar subsegmental atelectasis. No pneumothorax. Musculoskeletal: No chest wall abnormality. No acute or significant osseous findings. Degenerative thoracic spondylosis. Review of the MIP images confirms the above findings. CT ABDOMEN and PELVIS FINDINGS Hepatobiliary: 4.4 cm stone in the region of the gallbladder neck. Numerous small stones and/or sludge layering within the gallbladder fundus. Gallbladder is moderately distended with diffuse gallbladder wall thickening. No focal liver lesion is identified. There is mild intra and extrahepatic biliary ductal dilatation. Pancreas: Fatty atrophy of the pancreas. No focal pancreatic lesion. No ductal dilatation or inflammatory changes. Spleen: Normal in size without focal abnormality. Adrenals/Urinary Tract: Unremarkable adrenal glands. Kidneys enhance symmetrically without solid lesion, stone, or hydronephrosis. Ureters are nondilated. Urinary bladder appears unremarkable for the degree of distention. Stomach/Bowel: Stomach is within normal limits. Appendix appears normal. Scattered colonic diverticulosis. No evidence of bowel wall thickening, distention, or inflammatory changes. Vascular/Lymphatic: Non-enhancement of the left portal vein suspicious for portal vein thrombosis (series 2,  image 26). Main portal vein and right portal veins enhance homogeneously. Aortoiliac atherosclerosis. No abdominopelvic lymphadenopathy. Reproductive: Status post hysterectomy. No adnexal masses. Other: No free fluid. No abdominopelvic fluid collection. No pneumoperitoneum. Musculoskeletal: No acute or significant osseous findings. Degenerative lumbar  spondylosis. Review of the MIP images confirms the above findings. IMPRESSION: CTA Chest: 1. No evidence of pulmonary embolism. 2. Trace layering bilateral pleural effusions with mild bibasilar subsegmental atelectasis. 3. Mildly dilated pulmonary trunk suggesting pulmonary arterial hypertension. 4. Aortic and coronary artery atherosclerosis. CT Abdomen/Pelvis: 1. Non-enhancement of the left portal vein suspicious for portal vein thrombosis. Main portal vein and right portal veins enhance homogeneously. 2. Cholelithiasis with a 4.4 cm stone in the region of the gallbladder neck. Gallbladder is moderately distended with diffuse gallbladder wall thickening and mild intra and extrahepatic biliary ductal dilatation. Findings are concerning for acute cholecystitis. 3. Colonic diverticulosis without evidence of acute diverticulitis. 4. Aortic atherosclerosis (ICD10-I70.0). Electronically Signed   By: Davina Poke D.O.   On: 01/27/2022 13:38   CT ABDOMEN PELVIS W CONTRAST  Result Date: 01/27/2022 CLINICAL DATA:  Pulmonary embolism (PE) suspected, high prob; Abdominal pain, acute, nonlocalized EXAM: CT ANGIOGRAPHY CHEST CT ABDOMEN AND PELVIS WITH CONTRAST TECHNIQUE: Multidetector CT imaging of the chest was performed using the standard protocol during bolus administration of intravenous contrast. Multiplanar CT image reconstructions and MIPs were obtained to evaluate the vascular anatomy. Multidetector CT imaging of the abdomen and pelvis was performed using the standard protocol during bolus administration of intravenous contrast. RADIATION DOSE REDUCTION: This exam was performed according to the departmental dose-optimization program which includes automated exposure control, adjustment of the mA and/or kV according to patient size and/or use of iterative reconstruction technique. CONTRAST:  3m OMNIPAQUE IOHEXOL 350 MG/ML SOLN COMPARISON:  None Available. FINDINGS: CTA CHEST FINDINGS Cardiovascular:  Satisfactory opacification of the pulmonary arteries to the segmental level. No evidence of pulmonary embolism. Pulmonary trunk measures 3.5 cm in diameter. Thoracic aorta is nonaneurysmal. Scattered atherosclerotic vascular calcifications of the aorta and coronary arteries. Heart size is upper limits of normal. No pericardial effusion. Mediastinum/Nodes: No enlarged mediastinal, hilar, or axillary lymph nodes. Thyroid gland, trachea, and esophagus demonstrate no significant findings. Lungs/Pleura: Trace layering bilateral pleural effusions. Mild bibasilar subsegmental atelectasis. No pneumothorax. Musculoskeletal: No chest wall abnormality. No acute or significant osseous findings. Degenerative thoracic spondylosis. Review of the MIP images confirms the above findings. CT ABDOMEN and PELVIS FINDINGS Hepatobiliary: 4.4 cm stone in the region of the gallbladder neck. Numerous small stones and/or sludge layering within the gallbladder fundus. Gallbladder is moderately distended with diffuse gallbladder wall thickening. No focal liver lesion is identified. There is mild intra and extrahepatic biliary ductal dilatation. Pancreas: Fatty atrophy of the pancreas. No focal pancreatic lesion. No ductal dilatation or inflammatory changes. Spleen: Normal in size without focal abnormality. Adrenals/Urinary Tract: Unremarkable adrenal glands. Kidneys enhance symmetrically without solid lesion, stone, or hydronephrosis. Ureters are nondilated. Urinary bladder appears unremarkable for the degree of distention. Stomach/Bowel: Stomach is within normal limits. Appendix appears normal. Scattered colonic diverticulosis. No evidence of bowel wall thickening, distention, or inflammatory changes. Vascular/Lymphatic: Non-enhancement of the left portal vein suspicious for portal vein thrombosis (series 2, image 26). Main portal vein and right portal veins enhance homogeneously. Aortoiliac atherosclerosis. No abdominopelvic lymphadenopathy.  Reproductive: Status post hysterectomy. No adnexal masses. Other: No free fluid. No abdominopelvic fluid collection. No pneumoperitoneum. Musculoskeletal: No acute or significant osseous findings. Degenerative lumbar spondylosis. Review of the MIP images confirms the above findings. IMPRESSION:  CTA Chest: 1. No evidence of pulmonary embolism. 2. Trace layering bilateral pleural effusions with mild bibasilar subsegmental atelectasis. 3. Mildly dilated pulmonary trunk suggesting pulmonary arterial hypertension. 4. Aortic and coronary artery atherosclerosis. CT Abdomen/Pelvis: 1. Non-enhancement of the left portal vein suspicious for portal vein thrombosis. Main portal vein and right portal veins enhance homogeneously. 2. Cholelithiasis with a 4.4 cm stone in the region of the gallbladder neck. Gallbladder is moderately distended with diffuse gallbladder wall thickening and mild intra and extrahepatic biliary ductal dilatation. Findings are concerning for acute cholecystitis. 3. Colonic diverticulosis without evidence of acute diverticulitis. 4. Aortic atherosclerosis (ICD10-I70.0). Electronically Signed   By: Davina Poke D.O.   On: 01/27/2022 13:38   DG Chest 2 View  Result Date: 01/27/2022 CLINICAL DATA:  Fevers EXAM: CHEST - 2 VIEW COMPARISON:  08/03/16 CXR FINDINGS: No pleural effusion. No pneumothorax. Normal cardiac and mediastinal contours. No displaced rib fractures. Degenerative changes of the bilateral glenohumeral joints, progressed on the right compared to 2018. Vertebral body heights are maintained. Rounded densities in the upper abdomen only visualized on the lateral view could represent a combination of renal and gallstones. IMPRESSION: 1.  No active cardiopulmonary disease. 2.  Possible small left sided renal stone. Electronically Signed   By: Marin Roberts M.D.   On: 01/27/2022 10:06    Microbiology: Results for orders placed or performed during the hospital encounter of 01/27/22  Blood  culture (routine x 2)     Status: Abnormal   Collection Time: 01/27/22 10:48 AM   Specimen: BLOOD  Result Value Ref Range Status   Specimen Description   Final    BLOOD BLOOD LEFT ARM Performed at [facility], [address] [place], [place] [address]    Special Requests   Final    BOTTLES DRAWN AEROBIC AND ANAEROBIC Blood Culture results may not be optimal due to an excessive volume of blood received in culture bottles Performed at [facility], [address], [place], [place] [address]    Culture  Setup Time   Final    GRAM NEGATIVE RODS AEROBIC BOTTLE ONLY CRITICAL RESULT CALLED TO, READ BACK BY AND VERIFIED WITH: Loura Back PHARMD 0800 01/28/22 HNM Performed at [facility], [place] [address] [place], [place] [address]    Culture KLEBSIELLA PNEUMONIAE (A)  Final   Report Status 01/30/2022 FINAL  Final   Organism ID, Bacteria KLEBSIELLA PNEUMONIAE  Final      Susceptibility   Klebsiella pneumoniae - MIC*    AMPICILLIN >=32 RESISTANT Resistant     CEFAZOLIN <=4 SENSITIVE Sensitive     CEFEPIME <=0.12 SENSITIVE Sensitive     CEFTAZIDIME <=1 SENSITIVE Sensitive     CEFTRIAXONE <=0.25 SENSITIVE Sensitive     CIPROFLOXACIN <=0.25 SENSITIVE Sensitive     GENTAMICIN <=1 SENSITIVE Sensitive     IMIPENEM <=0.25 SENSITIVE Sensitive     TRIMETH/SULFA <=20 SENSITIVE Sensitive     AMPICILLIN/SULBACTAM 4 SENSITIVE Sensitive     PIP/TAZO <=4 SENSITIVE Sensitive     * KLEBSIELLA PNEUMONIAE  Resp Panel by RT-PCR (Flu A&B, Covid) Urine, Clean Catch     Status: None   Collection Time: 01/27/22 10:48 AM   Specimen: Urine, Clean Catch; Nasal Swab  Result Value Ref Range Status   SARS Coronavirus 2 by RT PCR NEGATIVE NEGATIVE Final    Comment: (NOTE) SARS-CoV-2 target nucleic acids are NOT DETECTED.  The SARS-CoV-2 RNA is generally detectable in upper respiratory specimens during the acute phase  of infection. The lowest concentration of SARS-CoV-2 viral  copies this assay can detect is 138 copies/mL. A negative result does not preclude SARS-Cov-2 infection and should not be used as the sole basis for treatment or other patient management decisions. A negative result may occur with  improper specimen collection/handling, submission of specimen other than nasopharyngeal swab, presence of viral mutation(s) within the areas targeted by this assay, and inadequate number of viral copies(<138 copies/mL). A negative result must be combined with clinical observations, patient history, and epidemiological information. The expected result is Negative.  Fact Sheet for Patients:  EntrepreneurPulse.com.au  Fact Sheet for Healthcare Providers:  IncredibleEmployment.be  This test is no t yet approved or cleared by the [place] FDA and  has been authorized for detection and/or diagnosis of SARS-CoV-2 by FDA under an Emergency Use Authorization (EUA). This EUA will remain  in effect (meaning this test can be used) for the duration of the COVID-19 declaration under Section 564(b)(1) of the Act, 21 U.S.C.section 360bbb-3(b)(1), unless the authorization is terminated  or revoked sooner.       Influenza A by PCR NEGATIVE NEGATIVE Final   Influenza B by PCR NEGATIVE NEGATIVE Final    Comment: (NOTE) The Xpert Xpress SARS-CoV-2/FLU/RSV plus assay is intended as an aid in the diagnosis of influenza from Nasopharyngeal swab specimens and should not be used as a sole basis for treatment. Nasal washings and aspirates are unacceptable for Xpert Xpress SARS-CoV-2/FLU/RSV testing.  Fact Sheet for Patients: EntrepreneurPulse.com.au  Fact Sheet for Healthcare Providers: IncredibleEmployment.be  This test is not yet approved or cleared by the [place] FDA and has been authorized for detection and/or diagnosis of SARS-CoV-2 by FDA under an Emergency Use Authorization (EUA). This  EUA will remain in effect (meaning this test can be used) for the duration of the COVID-19 declaration under Section 564(b)(1) of the Act, 21 U.S.C. section 360bbb-3(b)(1), unless the authorization is terminated or revoked.  Performed at [facility], [address] [place], [place] [address]   Blood Culture ID Panel (Reflexed)     Status: Abnormal   Collection Time: 01/27/22 10:48 AM  Result Value Ref Range Status   Enterococcus faecalis NOT DETECTED NOT DETECTED Final   Enterococcus Faecium NOT DETECTED NOT DETECTED Final   Listeria monocytogenes NOT DETECTED NOT DETECTED Final   Staphylococcus species NOT DETECTED NOT DETECTED Final   Staphylococcus aureus (BCID) NOT DETECTED NOT DETECTED Final   Staphylococcus epidermidis NOT DETECTED NOT DETECTED Final   Staphylococcus lugdunensis NOT DETECTED NOT DETECTED Final   Streptococcus species NOT DETECTED NOT DETECTED Final   Streptococcus agalactiae NOT DETECTED NOT DETECTED Final   Streptococcus pneumoniae NOT DETECTED NOT DETECTED Final   Streptococcus pyogenes NOT DETECTED NOT DETECTED Final   A.calcoaceticus-baumannii NOT DETECTED NOT DETECTED Final   Bacteroides fragilis NOT DETECTED NOT DETECTED Final   Enterobacterales DETECTED (A) NOT DETECTED Final    Comment: Enterobacterales represent a large order of gram negative bacteria, not a single organism. CRITICAL RESULT CALLED TO, READ BACK BY AND VERIFIED WITH: GREGORY GREENWOOD PHARMD 0800 01/28/22 HNM    Enterobacter cloacae complex NOT DETECTED NOT DETECTED Final   Escherichia coli NOT DETECTED NOT DETECTED Final   Klebsiella aerogenes NOT DETECTED NOT DETECTED Final   Klebsiella oxytoca NOT DETECTED NOT DETECTED Final   Klebsiella pneumoniae DETECTED (A) NOT DETECTED Final    Comment: CRITICAL RESULT CALLED TO, READ BACK BY AND VERIFIED WITH: GREGORY GREENWOOD PHARMD 0800 01/28/22 HNM  Proteus species NOT DETECTED NOT DETECTED Final   Salmonella species NOT  DETECTED NOT DETECTED Final   Serratia marcescens NOT DETECTED NOT DETECTED Final   Haemophilus influenzae NOT DETECTED NOT DETECTED Final   Neisseria meningitidis NOT DETECTED NOT DETECTED Final   Pseudomonas aeruginosa NOT DETECTED NOT DETECTED Final   Stenotrophomonas maltophilia NOT DETECTED NOT DETECTED Final   Candida albicans NOT DETECTED NOT DETECTED Final   Candida auris NOT DETECTED NOT DETECTED Final   Candida glabrata NOT DETECTED NOT DETECTED Final   Candida krusei NOT DETECTED NOT DETECTED Final   Candida parapsilosis NOT DETECTED NOT DETECTED Final   Candida tropicalis NOT DETECTED NOT DETECTED Final   Cryptococcus neoformans/gattii NOT DETECTED NOT DETECTED Final   CTX-M ESBL NOT DETECTED NOT DETECTED Final   Carbapenem resistance IMP NOT DETECTED NOT DETECTED Final   Carbapenem resistance KPC NOT DETECTED NOT DETECTED Final   Carbapenem resistance NDM NOT DETECTED NOT DETECTED Final   Carbapenem resist OXA 48 LIKE NOT DETECTED NOT DETECTED Final   Carbapenem resistance VIM NOT DETECTED NOT DETECTED Final    Comment: Performed at [facility], [address], [place], [place] [address]  Urine Culture     Status: None   Collection Time: 01/27/22 11:03 AM   Specimen: Urine, Clean Catch  Result Value Ref Range Status   Specimen Description   Final    URINE, CLEAN CATCH Performed at [facility], [address], [place], [place] [address]    Special Requests   Final    NONE Performed at [facility], [address] [place], [place] [address]    Culture   Final    NO GROWTH Performed at [facility], [address], [place], [place] [address]    Report Status 01/28/2022 FINAL  Final  Blood culture (routine x 2)     Status: None   Collection Time: 01/27/22 11:30 AM   Specimen: BLOOD  Result Value Ref Range Status   Specimen Description BLOOD RIGHT ANTECUBITAL  Final   Special Requests   Final    BOTTLES DRAWN AEROBIC AND  ANAEROBIC Blood Culture adequate volume   Culture   Final    NO GROWTH 5 DAYS Performed at [facility], [address] [place], [place] [address]    Report Status 02/01/2022 FINAL  Final  Aerobic/Anaerobic Culture w Gram Stain (surgical/deep wound)     Status: None   Collection Time: 01/28/22 10:23 AM   Specimen: Gallbladder; Bile  Result Value Ref Range Status   Specimen Description   Final    GALL BLADDER Performed at [facility], [address], [place], [place] [address]    Special Requests   Final    NONE Performed at [facility], [address] [place], [place] [address]    Gram Stain   Final    FEW WBC PRESENT, PREDOMINANTLY PMN FEW GRAM NEGATIVE RODS    Culture   Final    ABUNDANT KLEBSIELLA PNEUMONIAE NO ANAEROBES ISOLATED Performed at [facility], [place] [address] [place],  [address]    Report Status 02/02/2022 FINAL  Final   Organism ID, Bacteria KLEBSIELLA PNEUMONIAE  Final      Susceptibility   Klebsiella pneumoniae - MIC*    AMPICILLIN >=32 RESISTANT Resistant     CEFAZOLIN <=4 SENSITIVE Sensitive     CEFEPIME <=0.12 SENSITIVE Sensitive     CEFTAZIDIME <=1 SENSITIVE Sensitive     CEFTRIAXONE <=  0.25 SENSITIVE Sensitive     CIPROFLOXACIN <=0.25 SENSITIVE Sensitive     GENTAMICIN <=1 SENSITIVE Sensitive     IMIPENEM <=0.25 SENSITIVE Sensitive     TRIMETH/SULFA <=20 SENSITIVE Sensitive     AMPICILLIN/SULBACTAM 8 SENSITIVE Sensitive     PIP/TAZO <=4 SENSITIVE Sensitive     * ABUNDANT KLEBSIELLA PNEUMONIAE  MRSA Next Gen by PCR, Nasal     Status: None   Collection Time: 01/31/22 12:05 PM   Specimen: Nasal Mucosa; Nasal Swab  Result Value Ref Range Status   MRSA by PCR Next Gen NOT DETECTED NOT DETECTED Final    Comment: (NOTE) The GeneXpert MRSA Assay (FDA approved for NASAL specimens only), is one component of a comprehensive MRSA colonization surveillance program. It is not intended to diagnose MRSA infection  nor to guide or monitor treatment for MRSA infections. Test performance is not FDA approved in patients less than 21 years old. Performed at [facility], [address] [place], [place] [address]     Labs: CBC: Recent Labs  Lab 02/01/22 0420 02/02/22 0500 02/03/22 0430 02/04/22 0814 02/05/22 0542 02/06/22 0526  WBC 39.5* 34.3* 24.0*  --  14.5* 18.4*  NEUTROABS  --   --  19.7*  --   --   --   HGB 8.4* 8.4* 7.5* 7.5* 8.5* 8.7*  HCT 26.8* 26.6* 23.1*  --  26.0* 26.2*  MCV 92.7 92.7 90.9  --  88.4 90.0  PLT 266 252 238  --  274 017   Basic Metabolic Panel: Recent Labs  Lab 02/02/22 0500 02/03/22 0430 02/04/22 0814 02/05/22 0542 02/06/22 0526  NA 128* 128* 130* 130* 136  K 4.6 3.9 3.8 3.2* 3.5  CL 102 105 106 108 113*  CO2 14* 17* 16* 17* 20*  GLUCOSE 228* 227* 232* 231* 157*  BUN 42* 48* 50* 50* 39*  CREATININE 2.86* 3.16* 3.41* 2.99* 2.13*  CALCIUM 6.9* 6.6* 6.9* 7.0* 7.4*  MG 2.0 2.0  --   --   --   PHOS 6.9* 6.4*  --   --   --    Liver Function Tests: Recent Labs  Lab 01/31/22 0508 02/01/22 0420  AST 19 21  ALT 11 11  ALKPHOS 96 101  BILITOT 1.7* 1.5*  PROT 5.1* 5.1*  ALBUMIN 1.7* 1.6*   CBG: Recent Labs  Lab 02/05/22 0825 02/05/22 1211 02/05/22 1707 02/05/22 2115 02/06/22 0831  GLUCAP 185* 207* 222* 217* 119*    Discharge time spent: greater than 30 minutes.  Signed: Loletha Grayer, MD [facility] 02/06/2022

## 2022-02-06 NOTE — Plan of Care (Signed)

## 2022-02-06 NOTE — Progress Notes (Signed)
[facility]  ROUNDING NOTE   Subjective:   Patient seen sitting up in chair Alert and oriented cousin at bedside Appetite slowly improving, denies nausea and vomiting States she is able to void spontaneously Room air Continues to have significant amount of lower extremity edema   Objective:  Vital signs in last 24 hours:  Temp:  [97.8 F (36.6 C)-98 F (36.7 C)] 98 F (36.7 C) (12/09 0739) Pulse Rate:  [59-82] 72 (12/09 0739) Resp:  [17] 17 (12/09 0739) BP: (136-149)/(53-65) 149/65 (12/09 0739) SpO2:  [98 %-100 %] 100 % (12/09 0739)  Weight change:  Filed Weights   01/31/22 0500 01/31/22 1205 02/04/22 0500  Weight: 109.6 kg 107.4 kg 103 kg    Intake/Output: I/O last 3 completed shifts: In: 344.4 [Other:5; IV Piggyback:339.4] Out: 1000 [Urine:1000]   Intake/Output this shift:  No intake/output data recorded.  Physical Exam: General: NAD  Head: Normocephalic, atraumatic.  Dry oral mucosal membranes  Eyes: Anicteric  Lungs:  Clear to auscultation, normal effort  Heart: Regular rate and rhythm  Abdomen:  Soft, LLQ tender, drain in place  Extremities: 2+ peripheral edema.  Neurologic: Alert and oriented  Skin: No lesions  Access: None    Basic Metabolic Panel: Recent Labs  Lab 02/02/22 0500 02/03/22 0430 02/04/22 0814 02/05/22 0542 02/06/22 0526  NA 128* 128* 130* 130* 136  K 4.6 3.9 3.8 3.2* 3.5  CL 102 105 106 108 113*  CO2 14* 17* 16* 17* 20*  GLUCOSE 228* 227* 232* 231* 157*  BUN 42* 48* 50* 50* 39*  CREATININE 2.86* 3.16* 3.41* 2.99* 2.13*  CALCIUM 6.9* 6.6* 6.9* 7.0* 7.4*  MG 2.0 2.0  --   --   --   PHOS 6.9* 6.4*  --   --   --      Liver Function Tests: Recent Labs  Lab 01/31/22 0508 02/01/22 0420  AST 19 21  ALT 11 11  ALKPHOS 96 101  BILITOT 1.7* 1.5*  PROT 5.1* 5.1*  ALBUMIN 1.7* 1.6*    No results for input(s): "LIPASE", "AMYLASE" in the last 168 hours. No results for input(s): "AMMONIA" in the last 168  hours.  CBC: Recent Labs  Lab 02/01/22 0420 02/02/22 0500 02/03/22 0430 02/04/22 0814 02/05/22 0542 02/06/22 0526  WBC 39.5* 34.3* 24.0*  --  14.5* 18.4*  NEUTROABS  --   --  19.7*  --   --   --   HGB 8.4* 8.4* 7.5* 7.5* 8.5* 8.7*  HCT 26.8* 26.6* 23.1*  --  26.0* 26.2*  MCV 92.7 92.7 90.9  --  88.4 90.0  PLT 266 252 238  --  274 303     Cardiac Enzymes: No results for input(s): "CKTOTAL", "CKMB", "CKMBINDEX", "TROPONINI" in the last 168 hours.  BNP: Invalid input(s): "POCBNP"  CBG: Recent Labs  Lab 02/05/22 0825 02/05/22 1211 02/05/22 1707 02/05/22 2115 02/06/22 0831  GLUCAP 185* 207* 222* 217* 119*     Microbiology: Results for orders placed or performed during the hospital encounter of 01/27/22  Blood culture (routine x 2)     Status: Abnormal   Collection Time: 01/27/22 10:48 AM   Specimen: BLOOD  Result Value Ref Range Status   Specimen Description   Final    BLOOD BLOOD LEFT ARM Performed at [facility], [address] [place], [place] [address]    Special Requests   Final    BOTTLES DRAWN AEROBIC AND ANAEROBIC Blood Culture results may not be optimal due  to an excessive volume of blood received in culture bottles Performed at [facility], [address] [place], [place] [address]    Culture  Setup Time   Final    GRAM NEGATIVE RODS AEROBIC BOTTLE ONLY CRITICAL RESULT CALLED TO, READ BACK BY AND VERIFIED WITH: Loura Back PHARMD 0800 01/28/22 HNM Performed at [facility], [place] [address] [place], [place] [address]    Culture KLEBSIELLA PNEUMONIAE (A)  Final   Report Status 01/30/2022 FINAL  Final   Organism ID, Bacteria KLEBSIELLA PNEUMONIAE  Final      Susceptibility   Klebsiella pneumoniae - MIC*    AMPICILLIN >=32 RESISTANT Resistant     CEFAZOLIN <=4 SENSITIVE Sensitive     CEFEPIME <=0.12 SENSITIVE Sensitive     CEFTAZIDIME <=1 SENSITIVE Sensitive     CEFTRIAXONE <=0.25 SENSITIVE Sensitive      CIPROFLOXACIN <=0.25 SENSITIVE Sensitive     GENTAMICIN <=1 SENSITIVE Sensitive     IMIPENEM <=0.25 SENSITIVE Sensitive     TRIMETH/SULFA <=20 SENSITIVE Sensitive     AMPICILLIN/SULBACTAM 4 SENSITIVE Sensitive     PIP/TAZO <=4 SENSITIVE Sensitive     * KLEBSIELLA PNEUMONIAE  Resp Panel by RT-PCR (Flu A&B, Covid) Urine, Clean Catch     Status: None   Collection Time: 01/27/22 10:48 AM   Specimen: Urine, Clean Catch; Nasal Swab  Result Value Ref Range Status   SARS Coronavirus 2 by RT PCR NEGATIVE NEGATIVE Final    Comment: (NOTE) SARS-CoV-2 target nucleic acids are NOT DETECTED.  The SARS-CoV-2 RNA is generally detectable in upper respiratory specimens during the acute phase of infection. The lowest concentration of SARS-CoV-2 viral copies this assay can detect is 138 copies/mL. A negative result does not preclude SARS-Cov-2 infection and should not be used as the sole basis for treatment or other patient management decisions. A negative result may occur with  improper specimen collection/handling, submission of specimen other than nasopharyngeal swab, presence of viral mutation(s) within the areas targeted by this assay, and inadequate number of viral copies(<138 copies/mL). A negative result must be combined with clinical observations, patient history, and epidemiological information. The expected result is Negative.  Fact Sheet for Patients:  EntrepreneurPulse.com.au  Fact Sheet for Healthcare Providers:  IncredibleEmployment.be  This test is no t yet approved or cleared by the [place] FDA and  has been authorized for detection and/or diagnosis of SARS-CoV-2 by FDA under an Emergency Use Authorization (EUA). This EUA will remain  in effect (meaning this test can be used) for the duration of the COVID-19 declaration under Section 564(b)(1) of the Act, 21 U.S.C.section 360bbb-3(b)(1), unless the authorization is terminated  or revoked  sooner.       Influenza A by PCR NEGATIVE NEGATIVE Final   Influenza B by PCR NEGATIVE NEGATIVE Final    Comment: (NOTE) The Xpert Xpress SARS-CoV-2/FLU/RSV plus assay is intended as an aid in the diagnosis of influenza from Nasopharyngeal swab specimens and should not be used as a sole basis for treatment. Nasal washings and aspirates are unacceptable for Xpert Xpress SARS-CoV-2/FLU/RSV testing.  Fact Sheet for Patients: EntrepreneurPulse.com.au  Fact Sheet for Healthcare Providers: IncredibleEmployment.be  This test is not yet approved or cleared by the [place] FDA and has been authorized for detection and/or diagnosis of SARS-CoV-2 by FDA under an Emergency Use Authorization (EUA). This EUA will remain in effect (meaning this test can be used) for the duration of the COVID-19 declaration under Section 564(b)(1) of the Act,  21 U.S.C. section 360bbb-3(b)(1), unless the authorization is terminated or revoked.  Performed at [facility], [address] [place], [place] [address]   Blood Culture ID Panel (Reflexed)     Status: Abnormal   Collection Time: 01/27/22 10:48 AM  Result Value Ref Range Status   Enterococcus faecalis NOT DETECTED NOT DETECTED Final   Enterococcus Faecium NOT DETECTED NOT DETECTED Final   Listeria monocytogenes NOT DETECTED NOT DETECTED Final   Staphylococcus species NOT DETECTED NOT DETECTED Final   Staphylococcus aureus (BCID) NOT DETECTED NOT DETECTED Final   Staphylococcus epidermidis NOT DETECTED NOT DETECTED Final   Staphylococcus lugdunensis NOT DETECTED NOT DETECTED Final   Streptococcus species NOT DETECTED NOT DETECTED Final   Streptococcus agalactiae NOT DETECTED NOT DETECTED Final   Streptococcus pneumoniae NOT DETECTED NOT DETECTED Final   Streptococcus pyogenes NOT DETECTED NOT DETECTED Final   A.calcoaceticus-baumannii NOT DETECTED NOT DETECTED Final   Bacteroides fragilis NOT  DETECTED NOT DETECTED Final   Enterobacterales DETECTED (A) NOT DETECTED Final    Comment: Enterobacterales represent a large order of gram negative bacteria, not a single organism. CRITICAL RESULT CALLED TO, READ BACK BY AND VERIFIED WITH: GREGORY GREENWOOD PHARMD 0800 01/28/22 HNM    Enterobacter cloacae complex NOT DETECTED NOT DETECTED Final   Escherichia coli NOT DETECTED NOT DETECTED Final   Klebsiella aerogenes NOT DETECTED NOT DETECTED Final   Klebsiella oxytoca NOT DETECTED NOT DETECTED Final   Klebsiella pneumoniae DETECTED (A) NOT DETECTED Final    Comment: CRITICAL RESULT CALLED TO, READ BACK BY AND VERIFIED WITH: GREGORY GREENWOOD PHARMD 0800 01/28/22 HNM    Proteus species NOT DETECTED NOT DETECTED Final   Salmonella species NOT DETECTED NOT DETECTED Final   Serratia marcescens NOT DETECTED NOT DETECTED Final   Haemophilus influenzae NOT DETECTED NOT DETECTED Final   Neisseria meningitidis NOT DETECTED NOT DETECTED Final   Pseudomonas aeruginosa NOT DETECTED NOT DETECTED Final   Stenotrophomonas maltophilia NOT DETECTED NOT DETECTED Final   Candida albicans NOT DETECTED NOT DETECTED Final   Candida auris NOT DETECTED NOT DETECTED Final   Candida glabrata NOT DETECTED NOT DETECTED Final   Candida krusei NOT DETECTED NOT DETECTED Final   Candida parapsilosis NOT DETECTED NOT DETECTED Final   Candida tropicalis NOT DETECTED NOT DETECTED Final   Cryptococcus neoformans/gattii NOT DETECTED NOT DETECTED Final   CTX-M ESBL NOT DETECTED NOT DETECTED Final   Carbapenem resistance IMP NOT DETECTED NOT DETECTED Final   Carbapenem resistance KPC NOT DETECTED NOT DETECTED Final   Carbapenem resistance NDM NOT DETECTED NOT DETECTED Final   Carbapenem resist OXA 48 LIKE NOT DETECTED NOT DETECTED Final   Carbapenem resistance VIM NOT DETECTED NOT DETECTED Final    Comment: Performed at [facility], [address], [place], [place] [address]  Urine Culture     Status: None    Collection Time: 01/27/22 11:03 AM   Specimen: Urine, Clean Catch  Result Value Ref Range Status   Specimen Description   Final    URINE, CLEAN CATCH Performed at [facility], [address] [place], [place] [address]    Special Requests   Final    NONE Performed at [facility], [address] [place], [place] [address]    Culture   Final    NO GROWTH Performed at [facility], [address] [place],  [address]    Report Status 01/28/2022 FINAL  Final  Blood culture (routine x 2)  Status: None   Collection Time: 01/27/22 11:30 AM   Specimen: BLOOD  Result Value Ref Range Status   Specimen Description BLOOD RIGHT ANTECUBITAL  Final   Special Requests   Final    BOTTLES DRAWN AEROBIC AND ANAEROBIC Blood Culture adequate volume   Culture   Final    NO GROWTH 5 DAYS Performed at [facility], [address], [place], [place] [address]    Report Status 02/01/2022 FINAL  Final  Aerobic/Anaerobic Culture w Gram Stain (surgical/deep wound)     Status: None   Collection Time: 01/28/22 10:23 AM   Specimen: Gallbladder; Bile  Result Value Ref Range Status   Specimen Description   Final    GALL BLADDER Performed at [facility], [address] [place], [place] [address]    Special Requests   Final    NONE Performed at [facility], [address] [place], [place] [address]    Gram Stain   Final    FEW WBC PRESENT, PREDOMINANTLY PMN FEW GRAM NEGATIVE RODS    Culture   Final    ABUNDANT KLEBSIELLA PNEUMONIAE NO ANAEROBES ISOLATED Performed at [facility], [place] [address] [place], [place] [address]    Report Status 02/02/2022 FINAL  Final   Organism ID, Bacteria KLEBSIELLA PNEUMONIAE  Final      Susceptibility   Klebsiella pneumoniae - MIC*    AMPICILLIN >=32 RESISTANT Resistant     CEFAZOLIN <=4 SENSITIVE Sensitive     CEFEPIME <=0.12 SENSITIVE Sensitive     CEFTAZIDIME <=1 SENSITIVE  Sensitive     CEFTRIAXONE <=0.25 SENSITIVE Sensitive     CIPROFLOXACIN <=0.25 SENSITIVE Sensitive     GENTAMICIN <=1 SENSITIVE Sensitive     IMIPENEM <=0.25 SENSITIVE Sensitive     TRIMETH/SULFA <=20 SENSITIVE Sensitive     AMPICILLIN/SULBACTAM 8 SENSITIVE Sensitive     PIP/TAZO <=4 SENSITIVE Sensitive     * ABUNDANT KLEBSIELLA PNEUMONIAE  MRSA Next Gen by PCR, Nasal     Status: None   Collection Time: 01/31/22 12:05 PM   Specimen: Nasal Mucosa; Nasal Swab  Result Value Ref Range Status   MRSA by PCR Next Gen NOT DETECTED NOT DETECTED Final    Comment: (NOTE) The GeneXpert MRSA Assay (FDA approved for NASAL specimens only), is one component of a comprehensive MRSA colonization surveillance program. It is not intended to diagnose MRSA infection nor to guide or monitor treatment for MRSA infections. Test performance is not FDA approved in patients less than 27 years old. Performed at [facility], [address] [place], [place] [address]     Coagulation Studies: No results for input(s): "LABPROT", "INR" in the last 72 hours.  Urinalysis: No results for input(s): "COLORURINE", "LABSPEC", "PHURINE", "GLUCOSEU", "HGBUR", "BILIRUBINUR", "KETONESUR", "PROTEINUR", "UROBILINOGEN", "NITRITE", "LEUKOCYTESUR" in the last 72 hours.  Invalid input(s): "APPERANCEUR"    Imaging: No results found.   Medications:    sodium chloride Stopped (01/31/22 2120)    amoxicillin-clavulanate  1 tablet Oral Q12H   apixaban  5 mg Oral BID   aspirin EC  81 mg Oral Daily   calcium carbonate  2 tablet Oral BID   ezetimibe  10 mg Oral Daily   famotidine  20 mg Oral QHS   FLUoxetine  10 mg Oral Daily   insulin aspart  0-5 Units Subcutaneous QHS   insulin aspart  0-9 Units Subcutaneous TID WC   insulin aspart  2 Units Subcutaneous TID WC   insulin  glargine-yfgn  5 Units Subcutaneous QHS   levothyroxine  125 mcg Oral Q0600   multivitamin with minerals  1 tablet Oral Daily   polyethylene  glycol  17 g Oral Daily   Ensure Max Protein  11 oz Oral BID   rosuvastatin  20 mg Oral Daily   sodium bicarbonate  1,300 mg Oral BID   sodium chloride flush  5 mL Intracatheter Q8H   acetaminophen, ALPRAZolam, bisacodyl, HYDROmorphone (DILAUDID) injection, levalbuterol, nitroGLYCERIN, ondansetron (ZOFRAN) IV, ondansetron, traMADol  Assessment/ Plan:  [name] is a 76 y.o.  female  with medical problems of  DM-2, CAD, non-STEMI, hypertension, hyperlipidemia, hypothyroidism, chronic diastolic CHF, thrombocytopenia, major depressive disorder in remission, obstructive sleep apnea, portal vein thrombosis, obesity, acute left posterior tibial vein DVT,  was admitted on 01/27/2022 for Cholecystitis [K81.9] NSTEMI (non-ST elevated myocardial infarction) ([place]) [I21.4] Fever, unspecified fever cause [R50.9]   Acute kidney injury, acute metabolic acidosis nongap, Sepsis, LE edema Likely ATN due to multifactorial etiology from hypotension, Klebsiella sepsis, IV contrast exposure, IV Toradol (nonsteroidals) Baseline creatinine of 0.9, GFR greater than 60 from 12/11/2020.  Lab Results  Component Value Date   CREATININE 2.13 (H) 02/06/2022   CREATININE 2.99 (H) 02/05/2022   CREATININE 3.41 (H) 02/04/2022    Intake/Output Summary (Last 24 hours) at 02/06/2022 1129 Last data filed at 02/06/2022 0417 Gross per 24 hour  Intake 244.36 ml  Output --  Net 244.36 ml    Creatinine level continues to improve along with urine output.  Lower extremity edema remains however slightly improved.  No acute need for dialysis, appears renal recovery in progress.  Remains on antibiotics for infection, managed by primary team.  We will arrange follow-up in our office at discharge.    LOS: [place] 12/9/202311:29 AM

## 2022-02-08 ENCOUNTER — Telehealth: Payer: Self-pay

## 2022-02-08 DIAGNOSIS — E1140 Type 2 diabetes mellitus with diabetic neuropathy, unspecified: Secondary | ICD-10-CM | POA: Diagnosis not present

## 2022-02-08 DIAGNOSIS — Z792 Long term (current) use of antibiotics: Secondary | ICD-10-CM | POA: Diagnosis not present

## 2022-02-08 DIAGNOSIS — I5032 Chronic diastolic (congestive) heart failure: Secondary | ICD-10-CM | POA: Diagnosis not present

## 2022-02-08 DIAGNOSIS — E871 Hypo-osmolality and hyponatremia: Secondary | ICD-10-CM | POA: Diagnosis not present

## 2022-02-08 DIAGNOSIS — D509 Iron deficiency anemia, unspecified: Secondary | ICD-10-CM | POA: Diagnosis not present

## 2022-02-08 DIAGNOSIS — N179 Acute kidney failure, unspecified: Secondary | ICD-10-CM | POA: Diagnosis not present

## 2022-02-08 DIAGNOSIS — U071 COVID-19: Secondary | ICD-10-CM | POA: Diagnosis not present

## 2022-02-08 DIAGNOSIS — G4733 Obstructive sleep apnea (adult) (pediatric): Secondary | ICD-10-CM | POA: Diagnosis not present

## 2022-02-08 DIAGNOSIS — D696 Thrombocytopenia, unspecified: Secondary | ICD-10-CM | POA: Diagnosis not present

## 2022-02-08 DIAGNOSIS — Z7982 Long term (current) use of aspirin: Secondary | ICD-10-CM | POA: Diagnosis not present

## 2022-02-08 DIAGNOSIS — E1122 Type 2 diabetes mellitus with diabetic chronic kidney disease: Secondary | ICD-10-CM | POA: Diagnosis not present

## 2022-02-08 DIAGNOSIS — N1831 Chronic kidney disease, stage 3a: Secondary | ICD-10-CM | POA: Diagnosis not present

## 2022-02-08 DIAGNOSIS — Z7984 Long term (current) use of oral hypoglycemic drugs: Secondary | ICD-10-CM | POA: Diagnosis not present

## 2022-02-08 DIAGNOSIS — I130 Hypertensive heart and chronic kidney disease with heart failure and stage 1 through stage 4 chronic kidney disease, or unspecified chronic kidney disease: Secondary | ICD-10-CM | POA: Diagnosis not present

## 2022-02-08 DIAGNOSIS — E1169 Type 2 diabetes mellitus with other specified complication: Secondary | ICD-10-CM | POA: Diagnosis not present

## 2022-02-08 DIAGNOSIS — I214 Non-ST elevation (NSTEMI) myocardial infarction: Secondary | ICD-10-CM | POA: Diagnosis not present

## 2022-02-08 DIAGNOSIS — A419 Sepsis, unspecified organism: Secondary | ICD-10-CM | POA: Diagnosis not present

## 2022-02-08 DIAGNOSIS — K819 Cholecystitis, unspecified: Secondary | ICD-10-CM | POA: Diagnosis not present

## 2022-02-08 DIAGNOSIS — Z7901 Long term (current) use of anticoagulants: Secondary | ICD-10-CM | POA: Diagnosis not present

## 2022-02-08 DIAGNOSIS — I2510 Atherosclerotic heart disease of native coronary artery without angina pectoris: Secondary | ICD-10-CM | POA: Diagnosis not present

## 2022-02-08 DIAGNOSIS — E039 Hypothyroidism, unspecified: Secondary | ICD-10-CM | POA: Diagnosis not present

## 2022-02-08 NOTE — Patient Outreach (Signed)
  Care Coordination TOC Note Transition Care Management Follow-up Telephone Call Date of discharge and from where[facility] 01/27/22-02/06/22 How have you been since you were released from the hospital? Per patients daughter, she is better but has mobility issues.  Currently working with [facility]. Any questions or concerns? No  Items Reviewed: Did the pt receive and understand the discharge instructions provided? Yes  Medications obtained and verified? Yes  Other? No  Any new allergies since your discharge? No  Dietary orders reviewed? No Do you have support at home? Yes   Home Care and Equipment/Supplies: Were home health services ordered? yes If so, what is the name of the agency? [facility]  Has the agency set up a time to come to the patient's home? yes Were any new equipment or medical supplies ordered?  No What is the name of the medical supply agency? N/a Were you able to get the supplies/equipment? not applicable Do you have any questions related to the use of the equipment or supplies? No  Functional Questionnaire: (I = Independent and D = Dependent) ADLs: Needs assistance  Bathing/Dressing- Needs assistance  Meal Prep- Needs assistance  Eating- I  Maintaining continence- I  Transferring/Ambulation- Needs assistance  Managing Meds- needs assistance  Follow up appointments reviewed:  PCP Hospital f/u appt confirmed? Yes  Scheduled to see Dr. [place] Ranger on 02/10/22 @ 2:40. [facility] f/u appt confirmed? No   Are transportation arrangements needed? No  If their condition worsens, is the pt aware to call PCP or go to the Emergency Dept.? Yes Was the patient provided with contact information for the PCP's office or ED? Yes Was to pt encouraged to call back with questions or concerns? Yes  SDOH assessments and interventions completed:   Yes SDOH Interventions Today    Flowsheet Row Most Recent Value  SDOH Interventions   Food Insecurity Interventions  Intervention Not Indicated  Housing Interventions Intervention Not Indicated       Care Coordination Interventions:  No Care Coordination interventions needed at this time.   Encounter Outcome:  Pt. Visit Completed

## 2022-02-09 ENCOUNTER — Telehealth: Payer: Self-pay

## 2022-02-09 NOTE — Telephone Encounter (Signed)
Copied from [place] [phone]. Topic: Quick Communication - Home Health Verbal Orders >> Feb 09, 2022 12:17 PM [place] C wrote: Caller/Agency: [facility] / [place] Number: [phone] Requesting OT/PT/Skilled Nursing/Social Work/Speech Therapy: PT Frequency: 2w2, 1w3, 1 every 2 w 4 working on strength, endurance and balance

## 2022-02-09 NOTE — Telephone Encounter (Signed)
Copied from [place] [phone]. Topic: Quick Communication - Home Health Verbal Orders >> Feb 09, 2022 12:17 PM [place] C wrote: Caller/Agency: [facility] / [place] Number: [phone] Requesting OT/PT/Skilled Nursing/Social Work/Speech Therapy: PT Frequency: 2w2, 1w3, 1 every 2 w 4 working on strength, endurance and balance

## 2022-02-09 NOTE — Telephone Encounter (Signed)
Okay to proceed w/ verbal orders  Nobie Putnam, [facility] 02/09/2022, 4:53 PM

## 2022-02-10 ENCOUNTER — Encounter: Payer: Self-pay | Admitting: Family Medicine

## 2022-02-10 ENCOUNTER — Ambulatory Visit (INDEPENDENT_AMBULATORY_CARE_PROVIDER_SITE_OTHER): Payer: Medicare Other | Admitting: Family Medicine

## 2022-02-10 VITALS — BP 132/78 | HR 74 | Ht 66.0 in | Wt 263.0 lb

## 2022-02-10 DIAGNOSIS — R600 Localized edema: Secondary | ICD-10-CM | POA: Diagnosis not present

## 2022-02-10 DIAGNOSIS — R110 Nausea: Secondary | ICD-10-CM | POA: Diagnosis not present

## 2022-02-10 DIAGNOSIS — E1121 Type 2 diabetes mellitus with diabetic nephropathy: Secondary | ICD-10-CM

## 2022-02-10 DIAGNOSIS — K810 Acute cholecystitis: Secondary | ICD-10-CM | POA: Diagnosis not present

## 2022-02-10 DIAGNOSIS — Z8619 Personal history of other infectious and parasitic diseases: Secondary | ICD-10-CM | POA: Diagnosis not present

## 2022-02-10 DIAGNOSIS — E890 Postprocedural hypothyroidism: Secondary | ICD-10-CM

## 2022-02-10 DIAGNOSIS — N179 Acute kidney failure, unspecified: Secondary | ICD-10-CM | POA: Diagnosis not present

## 2022-02-10 DIAGNOSIS — N189 Chronic kidney disease, unspecified: Secondary | ICD-10-CM | POA: Diagnosis not present

## 2022-02-10 MED ORDER — ONETOUCH ULTRA VI STRP: ORAL_STRIP | 0 refills | Status: DC

## 2022-02-10 MED ORDER — ONETOUCH ULTRASOFT LANCETS MISC: 0 refills | Status: DC

## 2022-02-10 MED ORDER — FUROSEMIDE 20 MG PO TABS: 20.0000 mg | ORAL_TABLET | Freq: Every day | ORAL | 0 refills | Status: DC | PRN

## 2022-02-10 MED ORDER — ONDANSETRON 4 MG PO TBDP: 4.0000 mg | ORAL_TABLET | Freq: Three times a day (TID) | ORAL | 0 refills | Status: DC | PRN

## 2022-02-10 NOTE — Telephone Encounter (Signed)
Secure message let for [name].

## 2022-02-10 NOTE — Patient Instructions (Addendum)
Thank you for coming to the office today.  Ordered OneTouch lancets and test strips to [facility], let mek now in future if you need 90 day sent to mail order.  Recommend OTC Align Probiotic  Ordered Zofran dissolving tab for nausea as needed  Okay to keep Tradjenta for now for sugars, may need to adjust in future based on results. I can re order if / when ready.  Try Lasix '20mg'$  daily for few days to see if it helps, if you are urinating well keep it until Monday to talk to Nephrology and repeat lab for kidney function  Limit fluids to 1.5 L intake   Please schedule a Follow-up Appointment to: Return in about 6 weeks (around 03/24/2022) for 6 week follow-up updates specialists, DM, fluid.  If you have any other questions or concerns, please feel free to call the office or send a message through [place]. You may also schedule an earlier appointment if necessary.  Additionally, you may be receiving a survey about your experience at our office within a few days to 1 week by e-mail or mail. We value your feedback.  Nobie Putnam, DO [place]

## 2022-02-10 NOTE — Progress Notes (Unsigned)
Subjective:    Patient ID: [name], female    DOB: [birth year], 76 y.o.   MRN: [id number]  DONELLE HISE is a 76 y.o. female presenting on 02/10/2022 for Hospitalization Follow-up  Here with friend Langley Gauss.  [place]  ED visit 01/21/22 [facility] visit, ruled out MI cardiac etiology on their eval in observation. She did have nausea, shaking chills, hives episodes, discharged. She felt worse without improvement.   Hospital/Location: [facility]  Date of Admission: 01/27/22 Date of Discharge: 02/06/22 Transitions of care telephone call: Completed 02/08/22  Reason for Admission: Cholecystitis, acute / septic shock / NSTEMI / AKI on CKD 3, Anemia  - Hospital H&P and Discharge Summary have been reviewed - Patient presents today 4 days after recent hospitalization. Brief summary of recent course, patient had course as follows:  11/29: Admit to stepdown unit with Klebsiella sepsis secondary to acute cholecystitis/gallstones  11/30: Patient underwent image-guided percutaneous cholecystostomy tube placement.  Started on heparin drip for NSTEMI 12/2: Patient with worsening pain and increased white count.  Repeat CT abdomen showed abnormal positioning GB drain 12/3: S/p replacement of new 10 FR percutaneous cholecystectomy tube by IR.  Remained hypotensive transferred to ICU for pressors PCCM consulted 12/14 remains on pressors 12/5 remains on pressors 12/6 Off pressors and transferred out of the ICU 12/7 creatinine continues to rise at 3.41 patient was able to urinate last night and this morning.  With hemoglobin 7.5 will give a transfusion of 1 unit of packed red blood cells.  Change antibiotics from Zosyn over to Unasyn.  Discontinue central line. 12/8.  Creatinine improved to 2.99.  Patient urinating very well.  Hemoglobin up to 8.5 after transfusion. 12/9.  Creatinine down to 2.13, hemoglobin 8.7, stable for discharge.  She has acquired a lift chair since discharged  to help elevate and has improved some of her edema. She has been able to escalate activity to bathroom now.  NSTEMI  Patient was on Zosyn most of the hospital course and switch over to Unasyn. Will switch to Augmentin and complete total of 14 days of antibiotics.   Verbal orders now - [facility] *** needs order for help bathing?  She was changed off medications Ozempic and Metformin to Tradjenta to avoid biliary / pancreatic involvement.  She has OneTouch Ultra 2, needs test strips. Needs ordered to [facility]  DVT in new knee. On Eliquis 6 months. She was doing well previously on Physical Therapy and Ortho.  Gen Surgery 3 weeks later, hopeful to remove stent and drain  Nephrology upcoming. Now established after hospital. They are checking labs on Monday  She weighs daily now. Down 4-5 lbs.  Creatinine down to 2.13 (12/9) prior 3.41 (12/7 at peak), not on lasix but interested  Significant edema upper extremities. Lower Extremities. Pitting edema ***  Symptoms of *** ***have resolved ***are much improved ***seem to be worsening again. - New medications on discharge: *** - Changes to current meds on discharge: ***  I have reviewed the discharge medication list, and have reconciled the current and discharge medications today.   Current Outpatient Medications:    acetaminophen (TYLENOL) 650 MG CR tablet, Take 1,950 mg by mouth daily., Disp: , Rfl:    amoxicillin-clavulanate (AUGMENTIN) 875-125 MG tablet, Take 1 tablet by mouth every 12 (twelve) hours for 5 days., Disp: 10 tablet, Rfl: 0   apixaban (ELIQUIS) 5 MG TABS tablet, Take 1 tablet (5 mg total) by mouth 2 (two) times daily., Disp: 60 tablet,  Rfl: 0   aspirin EC (ASPIRIN 81) 81 MG tablet, Take 81 mg by mouth daily., Disp: , Rfl:    bisacodyl (DULCOLAX) 5 MG EC tablet, Take 2 tablets (10 mg total) by mouth daily as needed for moderate constipation., Disp: 30 tablet, Rfl: 0   calcium carbonate (TUMS - DOSED IN MG ELEMENTAL  CALCIUM) 500 MG chewable tablet, Chew 2 tablets (400 mg of elemental calcium total) by mouth 2 (two) times daily., Disp: 180 tablet, Rfl: 0   carvedilol (COREG) 3.125 MG tablet, Take 3.125 mg by mouth 2 (two) times daily., Disp: , Rfl:    ezetimibe (ZETIA) 10 MG tablet, TAKE 1 TABLET BY MOUTH DAILY, Disp: 90 tablet, Rfl: 2   fluconazole (DIFLUCAN) 150 MG tablet, Take 1 tablet (150 mg total) by mouth once for 1 dose., Disp: 1 tablet, Rfl: 0   FLUoxetine (PROZAC) 10 MG capsule, TAKE 1 CAPSULE BY MOUTH DAILY, Disp: 90 capsule, Rfl: 3   fluticasone (FLONASE) 50 MCG/ACT nasal spray, Place 2 sprays into both nostrils daily. (Patient taking differently: Place 2-3 sprays into both nostrils at bedtime as needed.), Disp: 16 g, Rfl: 0   Lancets (ONETOUCH ULTRASOFT) lancets, Use to check blood sugar daily, Disp: 100 each, Rfl: 0   levothyroxine (SYNTHROID) 125 MCG tablet, TAKE 1 TABLET BY MOUTH  DAILY BEFORE BREAKFAST, Disp: 90 tablet, Rfl: 3   linagliptin (TRADJENTA) 5 MG TABS tablet, Take 1 tablet (5 mg total) by mouth daily., Disp: 30 tablet, Rfl: 0   metoprolol succinate (TOPROL XL) 25 MG 24 hr tablet, Take 0.5 tablets (12.5 mg total) by mouth at bedtime., Disp: 15 tablet, Rfl: 0   Multiple Vitamin (MULTIVITAMIN WITH MINERALS) TABS tablet, Take 1 tablet by mouth daily., Disp: , Rfl:    nitroGLYCERIN (NITROSTAT) 0.4 MG SL tablet, Place 1 tablet (0.4 mg total) under the tongue every 5 (five) minutes as needed for chest pain., Disp: 30 tablet, Rfl: 11   ondansetron (ZOFRAN-ODT) 4 MG disintegrating tablet, Take 1 tablet (4 mg total) by mouth every 8 (eight) hours as needed for nausea or vomiting., Disp: 30 tablet, Rfl: 0   ONETOUCH ULTRA test strip, Use to check blood sugar daily, Disp: 100 each, Rfl: 0   polyethylene glycol (MIRALAX / GLYCOLAX) 17 g packet, Take 17 g by mouth daily as needed., Disp: 30 each, Rfl: 0   rosuvastatin (CRESTOR) 20 MG tablet, TAKE 1 TABLET BY MOUTH DAILY, Disp: 90 tablet, Rfl: 3    sodium bicarbonate 650 MG tablet, Take 2 tablets (1,300 mg total) by mouth daily., Disp: 60 tablet, Rfl: 0   sodium chloride flush 0.9 % SOLN injection, 10 mLs by Intracatheter route as needed. Flush gallbladder drain with 5-10 ml Normal Saline once daily, Disp: 560 mL, Rfl: 1   traMADol (ULTRAM) 50 MG tablet, Take 50 mg by mouth every 6 (six) hours as needed., Disp: , Rfl:   Past Surgical History:  Procedure Laterality Date   CATARACT EXTRACTION W/PHACO Left 08/30/2016   Procedure: CATARACT EXTRACTION PHACO AND INTRAOCULAR LENS PLACEMENT ([place]) Left daibetic;  Surgeon: Leandrew Koyanagi, MD;  Location: [facility];  Service: Ophthalmology;  Laterality: Left;  Diabetic - insulin and oral meds sleep apnea   CATARACT EXTRACTION W/PHACO Right 10/07/2016   Procedure: CATARACT EXTRACTION PHACO AND INTRAOCULAR LENS PLACEMENT ([facility]);  Surgeon: Leandrew Koyanagi, MD;  Location: [facility];  Service: Ophthalmology;  Laterality: Right;  Lot# [id number] H [place]: 00:43.3 AP%: 12.2 CDE: 5.28   CORONARY ANGIOPLASTY WITH STENT PLACEMENT Left  08/14/2013   Procedure: CORONARY ANGIOPLASTY WITH STENT PLACEMENT (3.5 x 23 mm Xience [place] DES to LAD); Location: [facility]; Surgeon: Algernon Huxley, MD   [facility] CYST EXCISION Right 1980   IR PERC CHOLECYSTOSTOMY  01/28/2022   IR PERC CHOLECYSTOSTOMY  01/31/2022   LAPAROSCOPIC HYSTERECTOMY     THYROIDECTOMY  1995   total   TOTAL KNEE ARTHROPLASTY Right 12/18/2020   Procedure: RIGHT TOTAL KNEE ARTHROPLASTY;  Surgeon: Thornton Park, MD;  Location: [facility];  Service: Orthopedics;  Laterality: Right;   TOTAL KNEE ARTHROPLASTY Left 12/10/2021   Procedure: TOTAL KNEE ARTHROPLASTY;  Surgeon: Thornton Park, MD;  Location: [facility];  Service: Orthopedics;  Laterality: Left;     ------------------------------------------------------------------------- Social History   Tobacco Use   Smoking status: Never   Smokeless tobacco: Never   Vaping Use   Vaping Use: Never used  Substance Use Topics   Alcohol use: No   Drug use: No    Review of Systems Per HPI unless specifically indicated above     Objective:    BP 132/78   Pulse 74   Ht '5\' 6"'$  (1.676 m)   Wt 263 lb (119.3 kg)   SpO2 99%   BMI 42.45 kg/m   Wt Readings from Last 3 Encounters:  02/10/22 263 lb (119.3 kg)  02/04/22 227 lb 1.2 oz (103 kg)  01/26/22 223 lb 8 oz (101.4 kg)    Physical Exam*** Results for orders placed or performed during the hospital encounter of 01/27/22  Blood culture (routine x 2)   Specimen: BLOOD  Result Value Ref Range   Specimen Description BLOOD RIGHT ANTECUBITAL    Special Requests      BOTTLES DRAWN AEROBIC AND ANAEROBIC Blood Culture adequate volume   Culture      NO GROWTH 5 DAYS Performed at [facility], [address] [place], [place] [address]    Report Status 02/01/2022 FINAL   Blood culture (routine x 2)   Specimen: BLOOD  Result Value Ref Range   Specimen Description      BLOOD BLOOD LEFT ARM Performed at [facility], [address] [place], [place] [address]    Special Requests      BOTTLES DRAWN AEROBIC AND ANAEROBIC Blood Culture results may not be optimal due to an excessive volume of blood received in culture bottles Performed at [facility], [address] [place], [place] [address]    Culture  Setup Time      GRAM NEGATIVE RODS AEROBIC BOTTLE ONLY CRITICAL RESULT CALLED TO, READ BACK BY AND VERIFIED WITH: Loura Back PHARMD 0800 01/28/22 HNM Performed at [facility], [place] [address], [place], [place] [address]    Culture KLEBSIELLA PNEUMONIAE (A)    Report Status 01/30/2022 FINAL    Organism ID, Bacteria KLEBSIELLA PNEUMONIAE       Susceptibility   Klebsiella pneumoniae - MIC*    AMPICILLIN >=32 RESISTANT Resistant     CEFAZOLIN <=4 SENSITIVE Sensitive     CEFEPIME <=0.12 SENSITIVE Sensitive     CEFTAZIDIME <=1 SENSITIVE Sensitive      CEFTRIAXONE <=0.25 SENSITIVE Sensitive     CIPROFLOXACIN <=0.25 SENSITIVE Sensitive     GENTAMICIN <=1 SENSITIVE Sensitive     IMIPENEM <=0.25 SENSITIVE Sensitive     TRIMETH/SULFA <=20 SENSITIVE Sensitive     AMPICILLIN/SULBACTAM 4 SENSITIVE Sensitive     PIP/TAZO <=4 SENSITIVE Sensitive     * KLEBSIELLA PNEUMONIAE  Resp Panel by  RT-PCR (Flu A&B, Covid) Urine, Clean Catch   Specimen: Urine, Clean Catch; Nasal Swab  Result Value Ref Range   SARS Coronavirus 2 by RT PCR NEGATIVE NEGATIVE   Influenza A by PCR NEGATIVE NEGATIVE   Influenza B by PCR NEGATIVE NEGATIVE  Urine Culture   Specimen: Urine, Clean Catch  Result Value Ref Range   Specimen Description      URINE, CLEAN CATCH Performed at [facility], [address] [place], [place] [address]    Special Requests      NONE Performed at [facility], [address] [place], [place] [address]    Culture      NO GROWTH Performed at [facility], [place] [address] [place], [place] [address]    Report Status 01/28/2022 FINAL   Blood Culture ID Panel (Reflexed)  Result Value Ref Range   Enterococcus faecalis NOT DETECTED NOT DETECTED   Enterococcus Faecium NOT DETECTED NOT DETECTED   Listeria monocytogenes NOT DETECTED NOT DETECTED   Staphylococcus species NOT DETECTED NOT DETECTED   Staphylococcus aureus (BCID) NOT DETECTED NOT DETECTED   Staphylococcus epidermidis NOT DETECTED NOT DETECTED   Staphylococcus lugdunensis NOT DETECTED NOT DETECTED   Streptococcus species NOT DETECTED NOT DETECTED   Streptococcus agalactiae NOT DETECTED NOT DETECTED   Streptococcus pneumoniae NOT DETECTED NOT DETECTED   Streptococcus pyogenes NOT DETECTED NOT DETECTED   A.calcoaceticus-baumannii NOT DETECTED NOT DETECTED   Bacteroides fragilis NOT DETECTED NOT DETECTED   Enterobacterales DETECTED (A) NOT DETECTED   Enterobacter cloacae complex NOT DETECTED NOT DETECTED   Escherichia coli NOT DETECTED NOT DETECTED    Klebsiella aerogenes NOT DETECTED NOT DETECTED   Klebsiella oxytoca NOT DETECTED NOT DETECTED   Klebsiella pneumoniae DETECTED (A) NOT DETECTED   Proteus species NOT DETECTED NOT DETECTED   Salmonella species NOT DETECTED NOT DETECTED   Serratia marcescens NOT DETECTED NOT DETECTED   Haemophilus influenzae NOT DETECTED NOT DETECTED   Neisseria meningitidis NOT DETECTED NOT DETECTED   Pseudomonas aeruginosa NOT DETECTED NOT DETECTED   Stenotrophomonas maltophilia NOT DETECTED NOT DETECTED   Candida albicans NOT DETECTED NOT DETECTED   Candida auris NOT DETECTED NOT DETECTED   Candida glabrata NOT DETECTED NOT DETECTED   Candida krusei NOT DETECTED NOT DETECTED   Candida parapsilosis NOT DETECTED NOT DETECTED   Candida tropicalis NOT DETECTED NOT DETECTED   Cryptococcus neoformans/gattii NOT DETECTED NOT DETECTED   CTX-M ESBL NOT DETECTED NOT DETECTED   Carbapenem resistance IMP NOT DETECTED NOT DETECTED   Carbapenem resistance KPC NOT DETECTED NOT DETECTED   Carbapenem resistance NDM NOT DETECTED NOT DETECTED   Carbapenem resist OXA 48 LIKE NOT DETECTED NOT DETECTED   Carbapenem resistance VIM NOT DETECTED NOT DETECTED  Aerobic/Anaerobic Culture w Gram Stain (surgical/deep wound)   Specimen: Gallbladder; Bile  Result Value Ref Range   Specimen Description      GALL BLADDER Performed at [facility], [address], [place], [place] [address]    Special Requests      NONE Performed at [facility], [address] [place], [place] [address]    Gram Stain      FEW WBC PRESENT, PREDOMINANTLY PMN FEW GRAM NEGATIVE RODS    Culture      ABUNDANT KLEBSIELLA PNEUMONIAE NO ANAEROBES ISOLATED Performed at [facility], [address], [place], [place] [address]    Report Status 02/02/2022 FINAL    Organism ID, Bacteria KLEBSIELLA PNEUMONIAE  Susceptibility   Klebsiella pneumoniae - MIC*    AMPICILLIN >=32 RESISTANT Resistant     CEFAZOLIN <=4  SENSITIVE Sensitive     CEFEPIME <=0.12 SENSITIVE Sensitive     CEFTAZIDIME <=1 SENSITIVE Sensitive     CEFTRIAXONE <=0.25 SENSITIVE Sensitive     CIPROFLOXACIN <=0.25 SENSITIVE Sensitive     GENTAMICIN <=1 SENSITIVE Sensitive     IMIPENEM <=0.25 SENSITIVE Sensitive     TRIMETH/SULFA <=20 SENSITIVE Sensitive     AMPICILLIN/SULBACTAM 8 SENSITIVE Sensitive     PIP/TAZO <=4 SENSITIVE Sensitive     * ABUNDANT KLEBSIELLA PNEUMONIAE  MRSA Next Gen by PCR, Nasal   Specimen: Nasal Mucosa; Nasal Swab  Result Value Ref Range   MRSA by PCR Next Gen NOT DETECTED NOT DETECTED  NM Myocar Multi W/Spect W/Wall Motion / EF  Result Value Ref Range   Rest HR 78.0 bpm   Rest BP 100/35 mmHg   Peak HR 88 bpm   Peak BP 168/49 mmHg   ST Depression (mm) 0 mm   Rest Nuclear Isotope Dose 10.2 mCi   Stress Nuclear Isotope Dose 29.1 mCi   SSS 6.0    SRS 7.0    SDS 6.0    TID 1.60    LV sys vol 38.0 mL   LV dias vol 79.0 46 - 106 mL   Nuc Stress EF 60 %   Base ST Depression (mm) 0 mm  Lactic acid, plasma  Result Value Ref Range   Lactic Acid, Venous 0.9 0.5 - 1.9 mmol/L  Lactic acid, plasma  Result Value Ref Range   Lactic Acid, Venous 1.0 0.5 - 1.9 mmol/L  Comprehensive metabolic panel  Result Value Ref Range   Sodium 132 (L) 135 - 145 mmol/L   Potassium 3.6 3.5 - 5.1 mmol/L   Chloride 102 98 - 111 mmol/L   CO2 23 22 - 32 mmol/L   Glucose, Bld 133 (H) 70 - 99 mg/dL   BUN 45 (H) 8 - 23 mg/dL   Creatinine, Ser 1.32 (H) 0.44 - 1.00 mg/dL   Calcium 7.5 (L) 8.9 - 10.3 mg/dL   Total Protein 6.0 (L) 6.5 - 8.1 g/dL   Albumin 2.3 (L) 3.5 - 5.0 g/dL   AST 35 15 - 41 U/L   ALT 20 0 - 44 U/L   Alkaline Phosphatase 115 38 - 126 U/L   Total Bilirubin 0.6 0.3 - 1.2 mg/dL   GFR, Estimated 42 (L) >60 mL/min   Anion gap 7 5 - 15  CBC with Differential  Result Value Ref Range   WBC 6.4 4.0 - 10.5 K/uL   RBC 3.22 (L) 3.87 - 5.11 MIL/uL   Hemoglobin 9.6 (L) 12.0 - 15.0 g/dL   HCT 30.1 (L) 36.0 - 46.0 %    MCV 93.5 80.0 - 100.0 fL   MCH 29.8 26.0 - 34.0 pg   MCHC 31.9 30.0 - 36.0 g/dL   RDW 13.5 11.5 - 15.5 %   Platelets 118 (L) 150 - 400 K/uL   nRBC 0.0 0.0 - 0.2 %   Neutrophils Relative % 84 %   Neutro Abs 5.4 1.7 - 7.7 K/uL   Lymphocytes Relative 5 %   Lymphs Abs 0.4 (L) 0.7 - 4.0 K/uL   Monocytes Relative 7 %   Monocytes Absolute 0.5 0.1 - 1.0 K/uL   Eosinophils Relative 2 %   Eosinophils Absolute 0.1 0.0 - 0.5 K/uL   Basophils Relative 0 %   Basophils Absolute 0.0  0.0 - 0.1 K/uL   Immature Granulocytes 2 %   Abs Immature Granulocytes 0.10 (H) 0.00 - 0.07 K/uL  Urinalysis, Routine w reflex microscopic Urine, Clean Catch  Result Value Ref Range   Color, Urine YELLOW (A) YELLOW   APPearance CLOUDY (A) CLEAR   Specific Gravity, Urine 1.017 1.005 - 1.030   pH 5.0 5.0 - 8.0   Glucose, UA NEGATIVE NEGATIVE mg/dL   Hgb urine dipstick NEGATIVE NEGATIVE   Bilirubin Urine NEGATIVE NEGATIVE   Ketones, ur NEGATIVE NEGATIVE mg/dL   Protein, ur 30 (A) NEGATIVE mg/dL   Nitrite NEGATIVE NEGATIVE   Leukocytes,Ua SMALL (A) NEGATIVE   RBC / HPF 0-5 0 - 5 RBC/hpf   WBC, UA 6-10 0 - 5 WBC/hpf   Bacteria, UA RARE (A) NONE SEEN   Squamous Epithelial / LPF 0-5 0 - 5  Procalcitonin - Baseline  Result Value Ref Range   Procalcitonin 12.71 ng/mL  D-dimer, quantitative  Result Value Ref Range   D-Dimer, Quant 10.28 (H) 0.00 - 0.50 ug/mL-FEU  APTT  Result Value Ref Range   aPTT 42 (H) 24 - 36 seconds  Protime-INR  Result Value Ref Range   Prothrombin Time 14.2 11.4 - 15.2 seconds   INR 1.1 0.8 - 1.2  Brain natriuretic peptide  Result Value Ref Range   B Natriuretic Peptide 38.4 0.0 - 100.0 pg/mL  Technologist smear review  Result Value Ref Range   WBC MORPHOLOGY MORPHOLOGY UNREMARKABLE    RBC MORPHOLOGY MORPHOLOGY UNREMARKABLE    Plt Morphology Normal platelet morphology    Clinical Information thrombocytopenea   Hemoglobin A1c  Result Value Ref Range   Hgb A1c MFr Bld 6.8 (H) 4.8 -  5.6 %   Mean Plasma Glucose 148 mg/dL  Heparin level (unfractionated)  Result Value Ref Range   Heparin Unfractionated 0.25 (L) 0.30 - 0.70 IU/mL  Procalcitonin  Result Value Ref Range   Procalcitonin 24.58 ng/mL  CBC  Result Value Ref Range   WBC 16.4 (H) 4.0 - 10.5 K/uL   RBC 3.16 (L) 3.87 - 5.11 MIL/uL   Hemoglobin 9.3 (L) 12.0 - 15.0 g/dL   HCT 28.8 (L) 36.0 - 46.0 %   MCV 91.1 80.0 - 100.0 fL   MCH 29.4 26.0 - 34.0 pg   MCHC 32.3 30.0 - 36.0 g/dL   RDW 13.9 11.5 - 15.5 %   Platelets 112 (L) 150 - 400 K/uL   nRBC 0.0 0.0 - 0.2 %  Basic metabolic panel  Result Value Ref Range   Sodium 133 (L) 135 - 145 mmol/L   Potassium 3.7 3.5 - 5.1 mmol/L   Chloride 104 98 - 111 mmol/L   CO2 20 (L) 22 - 32 mmol/L   Glucose, Bld 113 (H) 70 - 99 mg/dL   BUN 35 (H) 8 - 23 mg/dL   Creatinine, Ser 1.21 (H) 0.44 - 1.00 mg/dL   Calcium 7.5 (L) 8.9 - 10.3 mg/dL   GFR, Estimated 46 (L) >60 mL/min   Anion gap 9 5 - 15  Lactate dehydrogenase  Result Value Ref Range   LDH 120 98 - 192 U/L  Lipid panel  Result Value Ref Range   Cholesterol 51 0 - 200 mg/dL   Triglycerides 177 (H) <150 mg/dL   HDL <10 (L) >40 mg/dL   Total CHOL/HDL Ratio UNABLE TO CALCULATE RATIO   VLDL 35 0 - 40 mg/dL   LDL Cholesterol UNABLE TO CALCULATE 0 - 99 mg/dL  Glucose, capillary  Result Value Ref Range   Glucose-Capillary 98 70 - 99 mg/dL  Glucose, capillary  Result Value Ref Range   Glucose-Capillary 93 70 - 99 mg/dL  Heparin level (unfractionated)  Result Value Ref Range   Heparin Unfractionated 0.28 (L) 0.30 - 0.70 IU/mL  Glucose, capillary  Result Value Ref Range   Glucose-Capillary 75 70 - 99 mg/dL  Glucose, capillary  Result Value Ref Range   Glucose-Capillary 158 (H) 70 - 99 mg/dL  Glucose, capillary  Result Value Ref Range   Glucose-Capillary 216 (H) 70 - 99 mg/dL  CBC  Result Value Ref Range   WBC 25.5 (H) 4.0 - 10.5 K/uL   RBC 3.63 (L) 3.87 - 5.11 MIL/uL   Hemoglobin 10.7 (L) 12.0 - 15.0  g/dL   HCT 33.7 (L) 36.0 - 46.0 %   MCV 92.8 80.0 - 100.0 fL   MCH 29.5 26.0 - 34.0 pg   MCHC 31.8 30.0 - 36.0 g/dL   RDW 14.5 11.5 - 15.5 %   Platelets 141 (L) 150 - 400 K/uL   nRBC 0.0 0.0 - 0.2 %  Procalcitonin  Result Value Ref Range   Procalcitonin 9.14 ng/mL  Glucose, capillary  Result Value Ref Range   Glucose-Capillary 167 (H) 70 - 99 mg/dL  Heparin level (unfractionated)  Result Value Ref Range   Heparin Unfractionated 0.28 (L) 0.30 - 0.70 IU/mL  Basic metabolic panel  Result Value Ref Range   Sodium 129 (L) 135 - 145 mmol/L   Potassium 4.3 3.5 - 5.1 mmol/L   Chloride 101 98 - 111 mmol/L   CO2 15 (L) 22 - 32 mmol/L   Glucose, Bld 213 (H) 70 - 99 mg/dL   BUN 36 (H) 8 - 23 mg/dL   Creatinine, Ser 1.46 (H) 0.44 - 1.00 mg/dL   Calcium 7.7 (L) 8.9 - 10.3 mg/dL   GFR, Estimated 37 (L) >60 mL/min   Anion gap 13 5 - 15  Glucose, capillary  Result Value Ref Range   Glucose-Capillary 184 (H) 70 - 99 mg/dL  Heparin level (unfractionated)  Result Value Ref Range   Heparin Unfractionated 0.45 0.30 - 0.70 IU/mL  Glucose, capillary  Result Value Ref Range   Glucose-Capillary 176 (H) 70 - 99 mg/dL  CBC  Result Value Ref Range   WBC 44.6 (H) 4.0 - 10.5 K/uL   RBC 3.30 (L) 3.87 - 5.11 MIL/uL   Hemoglobin 9.7 (L) 12.0 - 15.0 g/dL   HCT 30.8 (L) 36.0 - 46.0 %   MCV 93.3 80.0 - 100.0 fL   MCH 29.4 26.0 - 34.0 pg   MCHC 31.5 30.0 - 36.0 g/dL   RDW 14.7 11.5 - 15.5 %   Platelets 186 150 - 400 K/uL   nRBC 0.0 0.0 - 0.2 %  Glucose, capillary  Result Value Ref Range   Glucose-Capillary 156 (H) 70 - 99 mg/dL  Heparin level (unfractionated)  Result Value Ref Range   Heparin Unfractionated 0.39 0.30 - 0.70 IU/mL  Glucose, capillary  Result Value Ref Range   Glucose-Capillary 152 (H) 70 - 99 mg/dL  Basic metabolic panel  Result Value Ref Range   Sodium 128 (L) 135 - 145 mmol/L   Potassium 3.9 3.5 - 5.1 mmol/L   Chloride 101 98 - 111 mmol/L   CO2 15 (L) 22 - 32 mmol/L    Glucose, Bld 153 (H) 70 - 99 mg/dL   BUN 42 (H) 8 - 23 mg/dL   Creatinine, Ser 2.29 (H) 0.44 -  1.00 mg/dL   Calcium 7.3 (L) 8.9 - 10.3 mg/dL   GFR, Estimated 22 (L) >60 mL/min   Anion gap 12 5 - 15  Glucose, capillary  Result Value Ref Range   Glucose-Capillary 147 (H) 70 - 99 mg/dL  Glucose, capillary  Result Value Ref Range   Glucose-Capillary 157 (H) 70 - 99 mg/dL  Heparin level (unfractionated)  Result Value Ref Range   Heparin Unfractionated <0.10 (L) 0.30 - 0.70 IU/mL  CBC  Result Value Ref Range   WBC 47.4 (H) 4.0 - 10.5 K/uL   RBC 2.91 (L) 3.87 - 5.11 MIL/uL   Hemoglobin 8.6 (L) 12.0 - 15.0 g/dL   HCT 27.7 (L) 36.0 - 46.0 %   MCV 95.2 80.0 - 100.0 fL   MCH 29.6 26.0 - 34.0 pg   MCHC 31.0 30.0 - 36.0 g/dL   RDW 15.1 11.5 - 15.5 %   Platelets 205 150 - 400 K/uL   nRBC 0.0 0.0 - 0.2 %  Comprehensive metabolic panel  Result Value Ref Range   Sodium 131 (L) 135 - 145 mmol/L   Potassium 4.1 3.5 - 5.1 mmol/L   Chloride 106 98 - 111 mmol/L   CO2 12 (L) 22 - 32 mmol/L   Glucose, Bld 125 (H) 70 - 99 mg/dL   BUN 42 (H) 8 - 23 mg/dL   Creatinine, Ser 2.54 (H) 0.44 - 1.00 mg/dL   Calcium 7.1 (L) 8.9 - 10.3 mg/dL   Total Protein 5.1 (L) 6.5 - 8.1 g/dL   Albumin 1.7 (L) 3.5 - 5.0 g/dL   AST 19 15 - 41 U/L   ALT 11 0 - 44 U/L   Alkaline Phosphatase 96 38 - 126 U/L   Total Bilirubin 1.7 (H) 0.3 - 1.2 mg/dL   GFR, Estimated 19 (L) >60 mL/min   Anion gap 13 5 - 15  Glucose, capillary  Result Value Ref Range   Glucose-Capillary 149 (H) 70 - 99 mg/dL  Glucose, capillary  Result Value Ref Range   Glucose-Capillary 126 (H) 70 - 99 mg/dL  Glucose, capillary  Result Value Ref Range   Glucose-Capillary 109 (H) 70 - 99 mg/dL  Heparin level (unfractionated)  Result Value Ref Range   Heparin Unfractionated 0.44 0.30 - 0.70 IU/mL  CBC  Result Value Ref Range   WBC 39.5 (H) 4.0 - 10.5 K/uL   RBC 2.89 (L) 3.87 - 5.11 MIL/uL   Hemoglobin 8.4 (L) 12.0 - 15.0 g/dL   HCT 26.8 (L)  36.0 - 46.0 %   MCV 92.7 80.0 - 100.0 fL   MCH 29.1 26.0 - 34.0 pg   MCHC 31.3 30.0 - 36.0 g/dL   RDW 15.3 11.5 - 15.5 %   Platelets 266 150 - 400 K/uL   nRBC 0.0 0.0 - 0.2 %  Comprehensive metabolic panel  Result Value Ref Range   Sodium 127 (L) 135 - 145 mmol/L   Potassium 3.9 3.5 - 5.1 mmol/L   Chloride 103 98 - 111 mmol/L   CO2 13 (L) 22 - 32 mmol/L   Glucose, Bld 217 (H) 70 - 99 mg/dL   BUN 43 (H) 8 - 23 mg/dL   Creatinine, Ser 2.66 (H) 0.44 - 1.00 mg/dL   Calcium 6.7 (L) 8.9 - 10.3 mg/dL   Total Protein 5.1 (L) 6.5 - 8.1 g/dL   Albumin 1.6 (L) 3.5 - 5.0 g/dL   AST 21 15 - 41 U/L   ALT 11 0 - 44 U/L  Alkaline Phosphatase 101 38 - 126 U/L   Total Bilirubin 1.5 (H) 0.3 - 1.2 mg/dL   GFR, Estimated 18 (L) >60 mL/min   Anion gap 11 5 - 15  Glucose, capillary  Result Value Ref Range   Glucose-Capillary 139 (H) 70 - 99 mg/dL  Heparin level (unfractionated)  Result Value Ref Range   Heparin Unfractionated 0.63 0.30 - 0.70 IU/mL  Lactic acid, plasma  Result Value Ref Range   Lactic Acid, Venous 1.0 0.5 - 1.9 mmol/L  Glucose, capillary  Result Value Ref Range   Glucose-Capillary 188 (H) 70 - 99 mg/dL  Glucose, capillary  Result Value Ref Range   Glucose-Capillary 175 (H) 70 - 99 mg/dL  Heparin level (unfractionated)  Result Value Ref Range   Heparin Unfractionated 0.95 (H) 0.30 - 0.70 IU/mL  Basic metabolic panel  Result Value Ref Range   Sodium 128 (L) 135 - 145 mmol/L   Potassium 4.6 3.5 - 5.1 mmol/L   Chloride 102 98 - 111 mmol/L   CO2 14 (L) 22 - 32 mmol/L   Glucose, Bld 228 (H) 70 - 99 mg/dL   BUN 42 (H) 8 - 23 mg/dL   Creatinine, Ser 2.86 (H) 0.44 - 1.00 mg/dL   Calcium 6.9 (L) 8.9 - 10.3 mg/dL   GFR, Estimated 17 (L) >60 mL/min   Anion gap 12 5 - 15  Magnesium  Result Value Ref Range   Magnesium 2.0 1.7 - 2.4 mg/dL  Phosphorus  Result Value Ref Range   Phosphorus 6.9 (H) 2.5 - 4.6 mg/dL  Glucose, capillary  Result Value Ref Range   Glucose-Capillary  158 (H) 70 - 99 mg/dL  Glucose, capillary  Result Value Ref Range   Glucose-Capillary 177 (H) 70 - 99 mg/dL  CBC  Result Value Ref Range   WBC 34.3 (H) 4.0 - 10.5 K/uL   RBC 2.87 (L) 3.87 - 5.11 MIL/uL   Hemoglobin 8.4 (L) 12.0 - 15.0 g/dL   HCT 26.6 (L) 36.0 - 46.0 %   MCV 92.7 80.0 - 100.0 fL   MCH 29.3 26.0 - 34.0 pg   MCHC 31.6 30.0 - 36.0 g/dL   RDW 15.2 11.5 - 15.5 %   Platelets 252 150 - 400 K/uL   nRBC 0.0 0.0 - 0.2 %  Glucose, capillary  Result Value Ref Range   Glucose-Capillary 160 (H) 70 - 99 mg/dL  Glucose, capillary  Result Value Ref Range   Glucose-Capillary 192 (H) 70 - 99 mg/dL  Glucose, capillary  Result Value Ref Range   Glucose-Capillary 238 (H) 70 - 99 mg/dL  CBC with Differential/Platelet  Result Value Ref Range   WBC 24.0 (H) 4.0 - 10.5 K/uL   RBC 2.54 (L) 3.87 - 5.11 MIL/uL   Hemoglobin 7.5 (L) 12.0 - 15.0 g/dL   HCT 23.1 (L) 36.0 - 46.0 %   MCV 90.9 80.0 - 100.0 fL   MCH 29.5 26.0 - 34.0 pg   MCHC 32.5 30.0 - 36.0 g/dL   RDW 15.4 11.5 - 15.5 %   Platelets 238 150 - 400 K/uL   nRBC 0.0 0.0 - 0.2 %   Neutrophils Relative % 82 %   Neutro Abs 19.7 (H) 1.7 - 7.7 K/uL   Lymphocytes Relative 6 %   Lymphs Abs 1.5 0.7 - 4.0 K/uL   Monocytes Relative 6 %   Monocytes Absolute 1.3 (H) 0.1 - 1.0 K/uL   Eosinophils Relative 0 %   Eosinophils Absolute 0.0 0.0 - 0.5  K/uL   Basophils Relative 0 %   Basophils Absolute 0.0 0.0 - 0.1 K/uL   WBC Morphology MILD LEFT SHIFT (1-5% METAS, OCC MYELO, OCC BANDS)    RBC Morphology MIXED RBC MORPHOLOGY    Smear Review Normal platelet morphology    Immature Granulocytes 6 %   Abs Immature Granulocytes 1.42 (H) 0.00 - 0.07 K/uL  Basic metabolic panel  Result Value Ref Range   Sodium 128 (L) 135 - 145 mmol/L   Potassium 3.9 3.5 - 5.1 mmol/L   Chloride 105 98 - 111 mmol/L   CO2 17 (L) 22 - 32 mmol/L   Glucose, Bld 227 (H) 70 - 99 mg/dL   BUN 48 (H) 8 - 23 mg/dL   Creatinine, Ser 3.16 (H) 0.44 - 1.00 mg/dL    Calcium 6.6 (L) 8.9 - 10.3 mg/dL   GFR, Estimated 15 (L) >60 mL/min   Anion gap 6 5 - 15  Magnesium  Result Value Ref Range   Magnesium 2.0 1.7 - 2.4 mg/dL  Phosphorus  Result Value Ref Range   Phosphorus 6.4 (H) 2.5 - 4.6 mg/dL  Glucose, capillary  Result Value Ref Range   Glucose-Capillary 221 (H) 70 - 99 mg/dL  Glucose, capillary  Result Value Ref Range   Glucose-Capillary 218 (H) 70 - 99 mg/dL  Glucose, capillary  Result Value Ref Range   Glucose-Capillary 278 (H) 70 - 99 mg/dL  Glucose, capillary  Result Value Ref Range   Glucose-Capillary 258 (H) 70 - 99 mg/dL  Glucose, capillary  Result Value Ref Range   Glucose-Capillary 255 (H) 70 - 99 mg/dL  Basic metabolic panel  Result Value Ref Range   Sodium 130 (L) 135 - 145 mmol/L   Potassium 3.8 3.5 - 5.1 mmol/L   Chloride 106 98 - 111 mmol/L   CO2 16 (L) 22 - 32 mmol/L   Glucose, Bld 232 (H) 70 - 99 mg/dL   BUN 50 (H) 8 - 23 mg/dL   Creatinine, Ser 3.41 (H) 0.44 - 1.00 mg/dL   Calcium 6.9 (L) 8.9 - 10.3 mg/dL   GFR, Estimated 13 (L) >60 mL/min   Anion gap 8 5 - 15  Hemoglobin  Result Value Ref Range   Hemoglobin 7.5 (L) 12.0 - 15.0 g/dL  Glucose, capillary  Result Value Ref Range   Glucose-Capillary 214 (H) 70 - 99 mg/dL  Glucose, capillary  Result Value Ref Range   Glucose-Capillary 266 (H) 70 - 99 mg/dL  CBC  Result Value Ref Range   WBC 14.5 (H) 4.0 - 10.5 K/uL   RBC 2.94 (L) 3.87 - 5.11 MIL/uL   Hemoglobin 8.5 (L) 12.0 - 15.0 g/dL   HCT 26.0 (L) 36.0 - 46.0 %   MCV 88.4 80.0 - 100.0 fL   MCH 28.9 26.0 - 34.0 pg   MCHC 32.7 30.0 - 36.0 g/dL   RDW 15.0 11.5 - 15.5 %   Platelets 274 150 - 400 K/uL   nRBC 0.0 0.0 - 0.2 %  Basic metabolic panel  Result Value Ref Range   Sodium 130 (L) 135 - 145 mmol/L   Potassium 3.2 (L) 3.5 - 5.1 mmol/L   Chloride 108 98 - 111 mmol/L   CO2 17 (L) 22 - 32 mmol/L   Glucose, Bld 231 (H) 70 - 99 mg/dL   BUN 50 (H) 8 - 23 mg/dL   Creatinine, Ser 2.99 (H) 0.44 - 1.00 mg/dL    Calcium 7.0 (L) 8.9 - 10.3 mg/dL  GFR, Estimated 16 (L) >60 mL/min   Anion gap 5 5 - 15  Glucose, capillary  Result Value Ref Range   Glucose-Capillary 276 (H) 70 - 99 mg/dL  Glucose, capillary  Result Value Ref Range   Glucose-Capillary 252 (H) 70 - 99 mg/dL  Glucose, capillary  Result Value Ref Range   Glucose-Capillary 185 (H) 70 - 99 mg/dL  Glucose, capillary  Result Value Ref Range   Glucose-Capillary 207 (H) 70 - 99 mg/dL  Basic metabolic panel  Result Value Ref Range   Sodium 136 135 - 145 mmol/L   Potassium 3.5 3.5 - 5.1 mmol/L   Chloride 113 (H) 98 - 111 mmol/L   CO2 20 (L) 22 - 32 mmol/L   Glucose, Bld 157 (H) 70 - 99 mg/dL   BUN 39 (H) 8 - 23 mg/dL   Creatinine, Ser 2.13 (H) 0.44 - 1.00 mg/dL   Calcium 7.4 (L) 8.9 - 10.3 mg/dL   GFR, Estimated 24 (L) >60 mL/min   Anion gap 3 (L) 5 - 15  CBC  Result Value Ref Range   WBC 18.4 (H) 4.0 - 10.5 K/uL   RBC 2.91 (L) 3.87 - 5.11 MIL/uL   Hemoglobin 8.7 (L) 12.0 - 15.0 g/dL   HCT 26.2 (L) 36.0 - 46.0 %   MCV 90.0 80.0 - 100.0 fL   MCH 29.9 26.0 - 34.0 pg   MCHC 33.2 30.0 - 36.0 g/dL   RDW 15.0 11.5 - 15.5 %   Platelets 303 150 - 400 K/uL   nRBC 0.0 0.0 - 0.2 %  Glucose, capillary  Result Value Ref Range   Glucose-Capillary 222 (H) 70 - 99 mg/dL   Comment 1 Notify RN   Glucose, capillary  Result Value Ref Range   Glucose-Capillary 217 (H) 70 - 99 mg/dL   Comment 1 Notify RN   Glucose, capillary  Result Value Ref Range   Glucose-Capillary 119 (H) 70 - 99 mg/dL  CBG monitoring, ED  Result Value Ref Range   Glucose-Capillary 146 (H) 70 - 99 mg/dL   Comment 1 Notify RN    Comment 2 Document in Chart   CBG monitoring, ED  Result Value Ref Range   Glucose-Capillary 115 (H) 70 - 99 mg/dL   Comment 1 Notify RN    Comment 2 Document in Chart   ECHOCARDIOGRAM COMPLETE  Result Value Ref Range   Weight 3,474.45 oz   Height 66 in   BP 112/47 mmHg   Ao pk vel 1.75 m/s   AV Area VTI 2.49 cm2   AR max vel 2.59  cm2   AV Mean grad 8.0 mmHg   AV Peak grad 12.3 mmHg   S' Lateral 2.80 cm   AV Area mean vel 2.28 cm2   Area-P 1/2 4.77 cm2  Type and screen [place]  Result Value Ref Range   ABO/RH(D) O POS    Antibody Screen NEG    Sample Expiration 02/07/2022,2359    Unit Number [id number]    Blood Component Type RED CELLS,LR    Unit division 00    Status of Unit ISSUED,FINAL    Transfusion Status OK TO TRANSFUSE    Crossmatch Result      Compatible Performed at [facility], [address] [place], [place] [address]   Prepare RBC (crossmatch)  Result Value Ref Range   Order Confirmation      ORDER PROCESSED BY BLOOD BANK Performed at [facility], [place]  [address], [place], [place] [address]   BPAM RBC  Result Value Ref Range   ISSUE DATE / TIME 850277412878    Blood Product Unit Number [id number]    PRODUCT CODE [id number]    Unit Type and Rh 5100    Blood Product Expiration Date [id number]   Troponin I (High Sensitivity)  Result Value Ref Range   Troponin I (High Sensitivity) 865 (HH) <18 ng/L  Troponin I (High Sensitivity)  Result Value Ref Range   Troponin I (High Sensitivity) 3,514 (HH) <18 ng/L  Troponin I (High Sensitivity)  Result Value Ref Range   Troponin I (High Sensitivity) 1,003 (HH) <18 ng/L  Troponin I (High Sensitivity)  Result Value Ref Range   Troponin I (High Sensitivity) 5,864 (HH) <18 ng/L  Troponin I (High Sensitivity)  Result Value Ref Range   Troponin I (High Sensitivity) 3,344 (HH) <18 ng/L      Assessment & Plan:   Problem List Items Addressed This Visit     Controlled type 2 diabetes mellitus with diabetic nephropathy (HCC)   Relevant Medications   ONETOUCH ULTRA test strip   Lancets (ONETOUCH ULTRASOFT) lancets   Hypothyroidism, postop   Relevant Medications   carvedilol (COREG) 3.125 MG tablet   Other Visit Diagnoses     Acute cholecystitis    -  Primary   History of sepsis       Acute  kidney injury superimposed on chronic kidney disease (HCC)       Nausea       Relevant Medications   ondansetron (ZOFRAN-ODT) 4 MG disintegrating tablet       ***  Meds ordered this encounter  Medications   ONETOUCH ULTRA test strip    Sig: Use to check blood sugar daily    Dispense:  100 each    Refill:  0    E11.21, OneTouch Ultra 2   Lancets (ONETOUCH ULTRASOFT) lancets    Sig: Use to check blood sugar daily    Dispense:  100 each    Refill:  0    E11.21, OneTouch Ultra 2   ondansetron (ZOFRAN-ODT) 4 MG disintegrating tablet    Sig: Take 1 tablet (4 mg total) by mouth every 8 (eight) hours as needed for nausea or vomiting.    Dispense:  30 tablet    Refill:  0    Follow up plan: Return in about 6 weeks (around 03/24/2022) for 6 week follow-up updates specialists, DM, fluid.   Nobie Putnam, DO [facility] 02/10/2022, 3:08 PM

## 2022-02-11 DIAGNOSIS — K819 Cholecystitis, unspecified: Secondary | ICD-10-CM | POA: Diagnosis not present

## 2022-02-11 DIAGNOSIS — I2510 Atherosclerotic heart disease of native coronary artery without angina pectoris: Secondary | ICD-10-CM | POA: Diagnosis not present

## 2022-02-11 DIAGNOSIS — I5032 Chronic diastolic (congestive) heart failure: Secondary | ICD-10-CM | POA: Diagnosis not present

## 2022-02-11 DIAGNOSIS — A419 Sepsis, unspecified organism: Secondary | ICD-10-CM | POA: Diagnosis not present

## 2022-02-11 DIAGNOSIS — I214 Non-ST elevation (NSTEMI) myocardial infarction: Secondary | ICD-10-CM | POA: Diagnosis not present

## 2022-02-11 DIAGNOSIS — E039 Hypothyroidism, unspecified: Secondary | ICD-10-CM | POA: Diagnosis not present

## 2022-02-11 DIAGNOSIS — G4733 Obstructive sleep apnea (adult) (pediatric): Secondary | ICD-10-CM | POA: Diagnosis not present

## 2022-02-11 DIAGNOSIS — D696 Thrombocytopenia, unspecified: Secondary | ICD-10-CM | POA: Diagnosis not present

## 2022-02-11 DIAGNOSIS — Z7984 Long term (current) use of oral hypoglycemic drugs: Secondary | ICD-10-CM | POA: Diagnosis not present

## 2022-02-11 DIAGNOSIS — E1140 Type 2 diabetes mellitus with diabetic neuropathy, unspecified: Secondary | ICD-10-CM | POA: Diagnosis not present

## 2022-02-11 DIAGNOSIS — Z792 Long term (current) use of antibiotics: Secondary | ICD-10-CM | POA: Diagnosis not present

## 2022-02-11 DIAGNOSIS — Z7901 Long term (current) use of anticoagulants: Secondary | ICD-10-CM | POA: Diagnosis not present

## 2022-02-11 DIAGNOSIS — D509 Iron deficiency anemia, unspecified: Secondary | ICD-10-CM | POA: Diagnosis not present

## 2022-02-11 DIAGNOSIS — N1831 Chronic kidney disease, stage 3a: Secondary | ICD-10-CM | POA: Diagnosis not present

## 2022-02-11 DIAGNOSIS — N179 Acute kidney failure, unspecified: Secondary | ICD-10-CM | POA: Diagnosis not present

## 2022-02-11 DIAGNOSIS — I130 Hypertensive heart and chronic kidney disease with heart failure and stage 1 through stage 4 chronic kidney disease, or unspecified chronic kidney disease: Secondary | ICD-10-CM | POA: Diagnosis not present

## 2022-02-11 DIAGNOSIS — U071 COVID-19: Secondary | ICD-10-CM | POA: Diagnosis not present

## 2022-02-11 DIAGNOSIS — E1122 Type 2 diabetes mellitus with diabetic chronic kidney disease: Secondary | ICD-10-CM | POA: Diagnosis not present

## 2022-02-11 DIAGNOSIS — Z7982 Long term (current) use of aspirin: Secondary | ICD-10-CM | POA: Diagnosis not present

## 2022-02-11 DIAGNOSIS — E871 Hypo-osmolality and hyponatremia: Secondary | ICD-10-CM | POA: Diagnosis not present

## 2022-02-11 DIAGNOSIS — E1169 Type 2 diabetes mellitus with other specified complication: Secondary | ICD-10-CM | POA: Diagnosis not present

## 2022-02-12 DIAGNOSIS — E1140 Type 2 diabetes mellitus with diabetic neuropathy, unspecified: Secondary | ICD-10-CM | POA: Diagnosis not present

## 2022-02-12 DIAGNOSIS — Z7901 Long term (current) use of anticoagulants: Secondary | ICD-10-CM | POA: Diagnosis not present

## 2022-02-12 DIAGNOSIS — I5032 Chronic diastolic (congestive) heart failure: Secondary | ICD-10-CM | POA: Diagnosis not present

## 2022-02-12 DIAGNOSIS — D509 Iron deficiency anemia, unspecified: Secondary | ICD-10-CM | POA: Diagnosis not present

## 2022-02-12 DIAGNOSIS — Z792 Long term (current) use of antibiotics: Secondary | ICD-10-CM | POA: Diagnosis not present

## 2022-02-12 DIAGNOSIS — D696 Thrombocytopenia, unspecified: Secondary | ICD-10-CM | POA: Diagnosis not present

## 2022-02-12 DIAGNOSIS — E039 Hypothyroidism, unspecified: Secondary | ICD-10-CM | POA: Diagnosis not present

## 2022-02-12 DIAGNOSIS — I130 Hypertensive heart and chronic kidney disease with heart failure and stage 1 through stage 4 chronic kidney disease, or unspecified chronic kidney disease: Secondary | ICD-10-CM | POA: Diagnosis not present

## 2022-02-12 DIAGNOSIS — E1169 Type 2 diabetes mellitus with other specified complication: Secondary | ICD-10-CM | POA: Diagnosis not present

## 2022-02-12 DIAGNOSIS — N1831 Chronic kidney disease, stage 3a: Secondary | ICD-10-CM | POA: Diagnosis not present

## 2022-02-12 DIAGNOSIS — Z7984 Long term (current) use of oral hypoglycemic drugs: Secondary | ICD-10-CM | POA: Diagnosis not present

## 2022-02-12 DIAGNOSIS — A419 Sepsis, unspecified organism: Secondary | ICD-10-CM | POA: Diagnosis not present

## 2022-02-12 DIAGNOSIS — K819 Cholecystitis, unspecified: Secondary | ICD-10-CM | POA: Diagnosis not present

## 2022-02-12 DIAGNOSIS — N179 Acute kidney failure, unspecified: Secondary | ICD-10-CM | POA: Diagnosis not present

## 2022-02-12 DIAGNOSIS — I214 Non-ST elevation (NSTEMI) myocardial infarction: Secondary | ICD-10-CM | POA: Diagnosis not present

## 2022-02-12 DIAGNOSIS — I2510 Atherosclerotic heart disease of native coronary artery without angina pectoris: Secondary | ICD-10-CM | POA: Diagnosis not present

## 2022-02-12 DIAGNOSIS — E871 Hypo-osmolality and hyponatremia: Secondary | ICD-10-CM | POA: Diagnosis not present

## 2022-02-12 DIAGNOSIS — E1122 Type 2 diabetes mellitus with diabetic chronic kidney disease: Secondary | ICD-10-CM | POA: Diagnosis not present

## 2022-02-12 DIAGNOSIS — U071 COVID-19: Secondary | ICD-10-CM | POA: Diagnosis not present

## 2022-02-12 DIAGNOSIS — Z7982 Long term (current) use of aspirin: Secondary | ICD-10-CM | POA: Diagnosis not present

## 2022-02-12 DIAGNOSIS — G4733 Obstructive sleep apnea (adult) (pediatric): Secondary | ICD-10-CM | POA: Diagnosis not present

## 2022-02-14 ENCOUNTER — Encounter: Payer: Self-pay | Admitting: Family Medicine

## 2022-02-14 DIAGNOSIS — F419 Anxiety disorder, unspecified: Secondary | ICD-10-CM

## 2022-02-14 DIAGNOSIS — E1121 Type 2 diabetes mellitus with diabetic nephropathy: Secondary | ICD-10-CM

## 2022-02-15 DIAGNOSIS — N179 Acute kidney failure, unspecified: Secondary | ICD-10-CM | POA: Diagnosis not present

## 2022-02-15 DIAGNOSIS — R600 Localized edema: Secondary | ICD-10-CM | POA: Diagnosis not present

## 2022-02-15 DIAGNOSIS — I10 Essential (primary) hypertension: Secondary | ICD-10-CM | POA: Diagnosis not present

## 2022-02-15 MED ORDER — ONETOUCH ULTRASOFT LANCETS MISC: 3 refills | Status: AC

## 2022-02-15 MED ORDER — ONETOUCH ULTRA VI STRP: ORAL_STRIP | 3 refills | Status: AC

## 2022-02-15 MED ORDER — BUSPIRONE HCL 5 MG PO TABS: 5.0000 mg | ORAL_TABLET | Freq: Three times a day (TID) | ORAL | 0 refills | Status: DC | PRN

## 2022-02-16 ENCOUNTER — Telehealth: Payer: Self-pay

## 2022-02-16 DIAGNOSIS — Z7984 Long term (current) use of oral hypoglycemic drugs: Secondary | ICD-10-CM | POA: Diagnosis not present

## 2022-02-16 DIAGNOSIS — G4733 Obstructive sleep apnea (adult) (pediatric): Secondary | ICD-10-CM | POA: Diagnosis not present

## 2022-02-16 DIAGNOSIS — E1169 Type 2 diabetes mellitus with other specified complication: Secondary | ICD-10-CM | POA: Diagnosis not present

## 2022-02-16 DIAGNOSIS — Z7901 Long term (current) use of anticoagulants: Secondary | ICD-10-CM | POA: Diagnosis not present

## 2022-02-16 DIAGNOSIS — Z792 Long term (current) use of antibiotics: Secondary | ICD-10-CM | POA: Diagnosis not present

## 2022-02-16 DIAGNOSIS — D696 Thrombocytopenia, unspecified: Secondary | ICD-10-CM | POA: Diagnosis not present

## 2022-02-16 DIAGNOSIS — E039 Hypothyroidism, unspecified: Secondary | ICD-10-CM | POA: Diagnosis not present

## 2022-02-16 DIAGNOSIS — E1122 Type 2 diabetes mellitus with diabetic chronic kidney disease: Secondary | ICD-10-CM | POA: Diagnosis not present

## 2022-02-16 DIAGNOSIS — E871 Hypo-osmolality and hyponatremia: Secondary | ICD-10-CM | POA: Diagnosis not present

## 2022-02-16 DIAGNOSIS — I2510 Atherosclerotic heart disease of native coronary artery without angina pectoris: Secondary | ICD-10-CM | POA: Diagnosis not present

## 2022-02-16 DIAGNOSIS — E1140 Type 2 diabetes mellitus with diabetic neuropathy, unspecified: Secondary | ICD-10-CM | POA: Diagnosis not present

## 2022-02-16 DIAGNOSIS — N179 Acute kidney failure, unspecified: Secondary | ICD-10-CM | POA: Diagnosis not present

## 2022-02-16 DIAGNOSIS — N1831 Chronic kidney disease, stage 3a: Secondary | ICD-10-CM | POA: Diagnosis not present

## 2022-02-16 DIAGNOSIS — D509 Iron deficiency anemia, unspecified: Secondary | ICD-10-CM | POA: Diagnosis not present

## 2022-02-16 DIAGNOSIS — I214 Non-ST elevation (NSTEMI) myocardial infarction: Secondary | ICD-10-CM | POA: Diagnosis not present

## 2022-02-16 DIAGNOSIS — I130 Hypertensive heart and chronic kidney disease with heart failure and stage 1 through stage 4 chronic kidney disease, or unspecified chronic kidney disease: Secondary | ICD-10-CM | POA: Diagnosis not present

## 2022-02-16 DIAGNOSIS — K819 Cholecystitis, unspecified: Secondary | ICD-10-CM | POA: Diagnosis not present

## 2022-02-16 DIAGNOSIS — Z7982 Long term (current) use of aspirin: Secondary | ICD-10-CM | POA: Diagnosis not present

## 2022-02-16 DIAGNOSIS — U071 COVID-19: Secondary | ICD-10-CM | POA: Diagnosis not present

## 2022-02-16 DIAGNOSIS — A419 Sepsis, unspecified organism: Secondary | ICD-10-CM | POA: Diagnosis not present

## 2022-02-16 DIAGNOSIS — I5032 Chronic diastolic (congestive) heart failure: Secondary | ICD-10-CM | POA: Diagnosis not present

## 2022-02-16 NOTE — Telephone Encounter (Signed)
Okay to proceed w verbal orders  Nobie Putnam, [facility] 02/16/2022, 10:58 AM

## 2022-02-16 NOTE — Telephone Encounter (Signed)
Copied from [place] [id number]. Topic: Quick Communication - Home Health Verbal Orders >> Feb 12, 2022  2:24 PM Everette C wrote: Caller/Agency: Damian Leavell Number: [phone] Requesting OT/PT/Skilled Nursing/Social Work/Speech Therapy: OT Frequency: 1w8

## 2022-02-18 NOTE — Telephone Encounter (Signed)
Message left on VM.

## 2022-02-19 DIAGNOSIS — A419 Sepsis, unspecified organism: Secondary | ICD-10-CM | POA: Diagnosis not present

## 2022-02-19 DIAGNOSIS — E871 Hypo-osmolality and hyponatremia: Secondary | ICD-10-CM | POA: Diagnosis not present

## 2022-02-19 DIAGNOSIS — Z7982 Long term (current) use of aspirin: Secondary | ICD-10-CM | POA: Diagnosis not present

## 2022-02-19 DIAGNOSIS — E1169 Type 2 diabetes mellitus with other specified complication: Secondary | ICD-10-CM | POA: Diagnosis not present

## 2022-02-19 DIAGNOSIS — G4733 Obstructive sleep apnea (adult) (pediatric): Secondary | ICD-10-CM | POA: Diagnosis not present

## 2022-02-19 DIAGNOSIS — Z7901 Long term (current) use of anticoagulants: Secondary | ICD-10-CM | POA: Diagnosis not present

## 2022-02-19 DIAGNOSIS — K819 Cholecystitis, unspecified: Secondary | ICD-10-CM | POA: Diagnosis not present

## 2022-02-19 DIAGNOSIS — Z792 Long term (current) use of antibiotics: Secondary | ICD-10-CM | POA: Diagnosis not present

## 2022-02-19 DIAGNOSIS — E039 Hypothyroidism, unspecified: Secondary | ICD-10-CM | POA: Diagnosis not present

## 2022-02-19 DIAGNOSIS — E1122 Type 2 diabetes mellitus with diabetic chronic kidney disease: Secondary | ICD-10-CM | POA: Diagnosis not present

## 2022-02-19 DIAGNOSIS — N1831 Chronic kidney disease, stage 3a: Secondary | ICD-10-CM | POA: Diagnosis not present

## 2022-02-19 DIAGNOSIS — I5032 Chronic diastolic (congestive) heart failure: Secondary | ICD-10-CM | POA: Diagnosis not present

## 2022-02-19 DIAGNOSIS — D696 Thrombocytopenia, unspecified: Secondary | ICD-10-CM | POA: Diagnosis not present

## 2022-02-19 DIAGNOSIS — E1140 Type 2 diabetes mellitus with diabetic neuropathy, unspecified: Secondary | ICD-10-CM | POA: Diagnosis not present

## 2022-02-19 DIAGNOSIS — I214 Non-ST elevation (NSTEMI) myocardial infarction: Secondary | ICD-10-CM | POA: Diagnosis not present

## 2022-02-19 DIAGNOSIS — N179 Acute kidney failure, unspecified: Secondary | ICD-10-CM | POA: Diagnosis not present

## 2022-02-19 DIAGNOSIS — I2510 Atherosclerotic heart disease of native coronary artery without angina pectoris: Secondary | ICD-10-CM | POA: Diagnosis not present

## 2022-02-19 DIAGNOSIS — Z7984 Long term (current) use of oral hypoglycemic drugs: Secondary | ICD-10-CM | POA: Diagnosis not present

## 2022-02-19 DIAGNOSIS — U071 COVID-19: Secondary | ICD-10-CM | POA: Diagnosis not present

## 2022-02-19 DIAGNOSIS — I130 Hypertensive heart and chronic kidney disease with heart failure and stage 1 through stage 4 chronic kidney disease, or unspecified chronic kidney disease: Secondary | ICD-10-CM | POA: Diagnosis not present

## 2022-02-19 DIAGNOSIS — D509 Iron deficiency anemia, unspecified: Secondary | ICD-10-CM | POA: Diagnosis not present

## 2022-02-23 ENCOUNTER — Ambulatory Visit
Admission: RE | Admit: 2022-02-23 | Discharge: 2022-02-23 | Disposition: A | Discharge status: Home / Self Care | Payer: Medicare Other | Source: Ambulatory Visit | Attending: Interventional Radiology | Admitting: Interventional Radiology

## 2022-02-23 ENCOUNTER — Other Ambulatory Visit: Payer: Self-pay | Admitting: Interventional Radiology

## 2022-02-23 DIAGNOSIS — K810 Acute cholecystitis: Secondary | ICD-10-CM

## 2022-02-23 DIAGNOSIS — T85638A Leakage of other specified internal prosthetic devices, implants and grafts, initial encounter: Secondary | ICD-10-CM | POA: Diagnosis not present

## 2022-02-23 DIAGNOSIS — I10 Essential (primary) hypertension: Secondary | ICD-10-CM | POA: Diagnosis not present

## 2022-02-23 DIAGNOSIS — Z434 Encounter for attention to other artificial openings of digestive tract: Secondary | ICD-10-CM | POA: Insufficient documentation

## 2022-02-23 DIAGNOSIS — N179 Acute kidney failure, unspecified: Secondary | ICD-10-CM | POA: Diagnosis not present

## 2022-02-23 DIAGNOSIS — R600 Localized edema: Secondary | ICD-10-CM | POA: Diagnosis not present

## 2022-02-23 HISTORY — PX: IR EXCHANGE BILIARY DRAIN: IMG6046

## 2022-02-23 MED ORDER — LIDOCAINE HCL 1 % IJ SOLN
INTRAMUSCULAR | Status: AC
  Administered 2022-02-23: 10 mL
  Filled 2022-02-23: qty 20

## 2022-02-23 MED ORDER — IOHEXOL 300 MG/ML  SOLN
8.0000 mL | Freq: Once | INTRAMUSCULAR | Status: AC | PRN
  Administered 2022-02-23: 8 mL

## 2022-02-23 NOTE — Procedures (Signed)
Vascular and Interventional Radiology Procedure Note  Patient: [name] DOB: May 21, 1945 Medical Record Number: [id number] Note Date/Time: 02/23/22 4:01 PM   Performing Physician: Michaelle Birks, MD Assistant(s): None  Diagnosis: Hx of perc chole. Catheter malfunction / leaking.  Procedure:  CHOLECYSTOSTOMY TUBE EXCHANGE, REPOSITIONING and UPSIZE ANTEROGRADE CHOLANGIOGRAM  Anesthesia: Local Anesthetic Complications: None Estimated Blood Loss: Minimal Specimens:  None  Findings:  Successful exchange, repositioning and upsize to a 55F cholecystostomy tube.  Plan: Flush tube w 10 mL sterile NS and record drain output qShift. Follow up for routine tube evaluation in 8 week(s).   See detailed procedure note with images in PACS. The patient tolerated the procedure well without incident or complication and was returned to Recovery in stable condition.    Michaelle Birks, MD [facility] Radiology   Pager. [place]

## 2022-02-24 ENCOUNTER — Encounter: Payer: Self-pay | Admitting: Family Medicine

## 2022-02-24 ENCOUNTER — Other Ambulatory Visit: Payer: Self-pay | Admitting: Interventional Radiology

## 2022-02-24 DIAGNOSIS — K810 Acute cholecystitis: Secondary | ICD-10-CM | POA: Diagnosis not present

## 2022-02-24 DIAGNOSIS — I82442 Acute embolism and thrombosis of left tibial vein: Secondary | ICD-10-CM

## 2022-02-24 DIAGNOSIS — I824Z9 Acute embolism and thrombosis of unspecified deep veins of unspecified distal lower extremity: Secondary | ICD-10-CM | POA: Diagnosis not present

## 2022-02-25 ENCOUNTER — Telehealth (INDEPENDENT_AMBULATORY_CARE_PROVIDER_SITE_OTHER): Payer: Self-pay

## 2022-02-25 DIAGNOSIS — E1169 Type 2 diabetes mellitus with other specified complication: Secondary | ICD-10-CM | POA: Diagnosis not present

## 2022-02-25 DIAGNOSIS — Z792 Long term (current) use of antibiotics: Secondary | ICD-10-CM | POA: Diagnosis not present

## 2022-02-25 DIAGNOSIS — I130 Hypertensive heart and chronic kidney disease with heart failure and stage 1 through stage 4 chronic kidney disease, or unspecified chronic kidney disease: Secondary | ICD-10-CM | POA: Diagnosis not present

## 2022-02-25 DIAGNOSIS — N1831 Chronic kidney disease, stage 3a: Secondary | ICD-10-CM | POA: Diagnosis not present

## 2022-02-25 DIAGNOSIS — I5032 Chronic diastolic (congestive) heart failure: Secondary | ICD-10-CM | POA: Diagnosis not present

## 2022-02-25 DIAGNOSIS — Z7982 Long term (current) use of aspirin: Secondary | ICD-10-CM | POA: Diagnosis not present

## 2022-02-25 DIAGNOSIS — K819 Cholecystitis, unspecified: Secondary | ICD-10-CM | POA: Diagnosis not present

## 2022-02-25 DIAGNOSIS — Z7901 Long term (current) use of anticoagulants: Secondary | ICD-10-CM | POA: Diagnosis not present

## 2022-02-25 DIAGNOSIS — G4733 Obstructive sleep apnea (adult) (pediatric): Secondary | ICD-10-CM | POA: Diagnosis not present

## 2022-02-25 DIAGNOSIS — E039 Hypothyroidism, unspecified: Secondary | ICD-10-CM | POA: Diagnosis not present

## 2022-02-25 DIAGNOSIS — U071 COVID-19: Secondary | ICD-10-CM | POA: Diagnosis not present

## 2022-02-25 DIAGNOSIS — E871 Hypo-osmolality and hyponatremia: Secondary | ICD-10-CM | POA: Diagnosis not present

## 2022-02-25 DIAGNOSIS — Z7984 Long term (current) use of oral hypoglycemic drugs: Secondary | ICD-10-CM | POA: Diagnosis not present

## 2022-02-25 DIAGNOSIS — E1122 Type 2 diabetes mellitus with diabetic chronic kidney disease: Secondary | ICD-10-CM | POA: Diagnosis not present

## 2022-02-25 DIAGNOSIS — E1140 Type 2 diabetes mellitus with diabetic neuropathy, unspecified: Secondary | ICD-10-CM | POA: Diagnosis not present

## 2022-02-25 DIAGNOSIS — I2510 Atherosclerotic heart disease of native coronary artery without angina pectoris: Secondary | ICD-10-CM | POA: Diagnosis not present

## 2022-02-25 DIAGNOSIS — N179 Acute kidney failure, unspecified: Secondary | ICD-10-CM | POA: Diagnosis not present

## 2022-02-25 DIAGNOSIS — D509 Iron deficiency anemia, unspecified: Secondary | ICD-10-CM | POA: Diagnosis not present

## 2022-02-25 DIAGNOSIS — I214 Non-ST elevation (NSTEMI) myocardial infarction: Secondary | ICD-10-CM | POA: Diagnosis not present

## 2022-02-25 DIAGNOSIS — D696 Thrombocytopenia, unspecified: Secondary | ICD-10-CM | POA: Diagnosis not present

## 2022-02-25 DIAGNOSIS — A419 Sepsis, unspecified organism: Secondary | ICD-10-CM | POA: Diagnosis not present

## 2022-02-25 MED ORDER — APIXABAN 5 MG PO TABS: 5.0000 mg | ORAL_TABLET | Freq: Two times a day (BID) | ORAL | 1 refills | Status: DC

## 2022-02-25 NOTE — Telephone Encounter (Signed)
Per secure chat - LVM for pt TCB and schedule appt  np. consult. possible IVC filter placement proceeding with gallbladder surgery - referred by sakai, isami

## 2022-02-27 ENCOUNTER — Encounter: Payer: Self-pay | Admitting: Family Medicine

## 2022-02-27 DIAGNOSIS — E1121 Type 2 diabetes mellitus with diabetic nephropathy: Secondary | ICD-10-CM

## 2022-02-27 DIAGNOSIS — I129 Hypertensive chronic kidney disease with stage 1 through stage 4 chronic kidney disease, or unspecified chronic kidney disease: Secondary | ICD-10-CM

## 2022-03-01 NOTE — Progress Notes (Signed)
Cardiology Office Note:    Date:  03/02/2022   ID:  [name], DOB 11-13-1945, MRN [id number]  PCP:  Olin Hauser, [place] Providers Cardiologist:  Ida Rogue, MD     Referring MD: Nobie Putnam *   Chief Complaint  Patient presents with   Follow-up    PreOp clearance, no new cardiac concerns     History of Present Illness:    [name] is a 77 y.o. female with a hx of CAD (07/2013 anterior STEMI with DES to LAD in [place]), diastolic dysfuntion, HTN, HLD, DM2, CKD III, OSA on CPAP.   Last office visit with Dr. Rockey Situ on 01/26/22, general malaise, febrile and hypotensive 80/48. Was admitted with sepsis the following day.   Admitted to the hospital 01/27/22 - 02/06/22 with septic shock secondary to cholecystitis/gallstones and NSTEMI. Heparin drip was started for her NSTEMI, which was felt to be secondary to her sepsis. Echo revealed EF 60-65% grade I DD. Myoview showed findings are consistent with no ischemia and no prior myocardial infarction, low risk study. She underwent percutaneous cholecystostomy stent that later had to be replaced d/t abnormal positioning. She developed AKI on CKD, requiring pressor support. LE venous doppler was positive for acute occlusive DVT in the left posterior tibial vein and she was eventually discharged on Eliquis.   Since her discharge, she has followed up with her PCP, found to be volume overloaded (+40 lbs) and started on lasix 20 mg once/day and to follow up with nephrology. Evaluated by nephrology on 12/18 (wt 254) and again on 12/26, lasix was increased to 20 mg twice daily, she has diuresed to ~ 5 lbs of her baseline weight, which is 225. She has followed up with general surgery and they would like for her to have an IVC filter placed prior to removing her gallbladder. She will see [facility] on 03/05/21 to discuss her DVT and other options as she does not want to have an IVC placed.   She presents today for  follow up post hospitalization. She has home health coming twice/week and they are checking her BP each visit, reports they are "130's/70's". Weight today is 229, baseline pre-hospital weight is 225. She denies chest pain, palpitations, dyspnea, pnd, orthopnea, n, v, dizziness, syncope, weight gain, or early satiety. She does have +2 pedal edema that is much improved. She is weighing herself daily. Discussed sodium restrictions. Several upcoming appointments and potential upcoming surgery (cholecystectomy, possible IVC placement).    Past Medical History:  Diagnosis Date   Coronary artery disease    a. 07/2013 STEMI/PCI ([place]): LM nl, LAD 100p (thrombectomy & 3.5x23 Alpine DES), LCX nl, RCA nl, EF 45%.   Depression    Diastolic dysfunction    a.) TTE 11/26/2020 - LVEF 50-55%; G2DD.   Endometrial cancer ([place])    Gravida 3 para 3    Hyperlipidemia    Hypertension    Hypertensive heart disease    Hypothyroid    Ischemic cardiomyopathy    a.) 07/2013 EF 45% @ time of STEMI; b.) 11/2013 Echo: EF 55-60%, mildly dil LA, nl RV fxn. c.) TTE 07/29/2015 - LVEF 55-60%, mild LA and RV dilation. d.) TTE 11/26/2020 - LVEF 50-55%; G2DD.   Menopause    Obstructive sleep apnea on CPAP    Osteoarthritis    PONV (postoperative nausea and vomiting)    ST elevation myocardial infarction (STEMI) of anterolateral wall ([place]) 08/14/2013   a.) LVEF reduced at  45%; Tx'd with trombectomy and PCI to 100% LAD; 3.5 x 23 mm Xience Alpine DES placed.   T2DM (type 2 diabetes mellitus) ([place])    Thyroid cancer ([place])    a.) s/p total thyroidectomy. b.) on daily levothyroxine.    Past Surgical History:  Procedure Laterality Date   CATARACT EXTRACTION W/PHACO Left 08/30/2016   Procedure: CATARACT EXTRACTION PHACO AND INTRAOCULAR LENS PLACEMENT ([place]) Left daibetic;  Surgeon: Leandrew Koyanagi, MD;  Location: [facility];  Service: Ophthalmology;  Laterality: Left;  Diabetic - insulin and oral meds sleep apnea    CATARACT EXTRACTION W/PHACO Right 10/07/2016   Procedure: CATARACT EXTRACTION PHACO AND INTRAOCULAR LENS PLACEMENT ([facility]);  Surgeon: Leandrew Koyanagi, MD;  Location: [facility];  Service: Ophthalmology;  Laterality: Right;  Lot# [id number] H [place]: 00:43.3 AP%: 12.2 CDE: 5.28   CORONARY ANGIOPLASTY WITH STENT PLACEMENT Left 08/14/2013   Procedure: CORONARY ANGIOPLASTY WITH STENT PLACEMENT (3.5 x 23 mm Xience Alpine DES to LAD); Location: [facility], [place]; Surgeon: Algernon Huxley, MD   GANGLION CYST EXCISION Right 1980   IR EXCHANGE BILIARY DRAIN  02/23/2022   IR PERC CHOLECYSTOSTOMY  01/28/2022   IR PERC CHOLECYSTOSTOMY  01/31/2022   LAPAROSCOPIC HYSTERECTOMY     THYROIDECTOMY  1995   total   TOTAL KNEE ARTHROPLASTY Right 12/18/2020   Procedure: RIGHT TOTAL KNEE ARTHROPLASTY;  Surgeon: Thornton Park, MD;  Location: [facility];  Service: Orthopedics;  Laterality: Right;   TOTAL KNEE ARTHROPLASTY Left 12/10/2021   Procedure: TOTAL KNEE ARTHROPLASTY;  Surgeon: Thornton Park, MD;  Location: [facility];  Service: Orthopedics;  Laterality: Left;    Current Medications: Current Meds  Medication Sig   acetaminophen (TYLENOL) 650 MG CR tablet Take 1,950 mg by mouth daily.   apixaban (ELIQUIS) 5 MG TABS tablet Take 1 tablet (5 mg total) by mouth 2 (two) times daily.   aspirin EC (ASPIRIN 81) 81 MG tablet Take 81 mg by mouth daily.   bisacodyl (DULCOLAX) 5 MG EC tablet Take 2 tablets (10 mg total) by mouth daily as needed for moderate constipation.   busPIRone (BUSPAR) 5 MG tablet Take 1 tablet (5 mg total) by mouth 3 (three) times daily as needed (anxiety).   calcium carbonate (TUMS - DOSED IN MG ELEMENTAL CALCIUM) 500 MG chewable tablet Chew 2 tablets (400 mg of elemental calcium total) by mouth 2 (two) times daily.   ezetimibe (ZETIA) 10 MG tablet TAKE 1 TABLET BY MOUTH DAILY   FLUoxetine (PROZAC) 10 MG capsule TAKE 1 CAPSULE BY MOUTH DAILY   fluticasone (FLONASE) 50 MCG/ACT nasal  spray Place 2 sprays into both nostrils daily. (Patient taking differently: Place 2-3 sprays into both nostrils at bedtime as needed.)   furosemide (LASIX) 20 MG tablet Take 1 tablet (20 mg total) by mouth daily as needed for edema or fluid.   Lancets (ONETOUCH ULTRASOFT) lancets Use to check blood sugar daily   levothyroxine (SYNTHROID) 125 MCG tablet TAKE 1 TABLET BY MOUTH  DAILY BEFORE BREAKFAST   linagliptin (TRADJENTA) 5 MG TABS tablet Take 1 tablet (5 mg total) by mouth daily.   metoprolol succinate (TOPROL XL) 25 MG 24 hr tablet Take 0.5 tablets (12.5 mg total) by mouth at bedtime.   Multiple Vitamin (MULTIVITAMIN WITH MINERALS) TABS tablet Take 1 tablet by mouth daily.   nitroGLYCERIN (NITROSTAT) 0.4 MG SL tablet Place 1 tablet (0.4 mg total) under the tongue every 5 (five) minutes as needed for chest pain.   ondansetron (ZOFRAN-ODT) 4 MG disintegrating  tablet Take 1 tablet (4 mg total) by mouth every 8 (eight) hours as needed for nausea or vomiting.   ONETOUCH ULTRA test strip Use to check blood sugar daily   polyethylene glycol (MIRALAX / GLYCOLAX) 17 g packet Take 17 g by mouth daily as needed.   rosuvastatin (CRESTOR) 20 MG tablet TAKE 1 TABLET BY MOUTH DAILY   sodium bicarbonate 650 MG tablet Take 2 tablets (1,300 mg total) by mouth daily.   sodium chloride flush 0.9 % SOLN injection 10 mLs by Intracatheter route as needed. Flush gallbladder drain with 5-10 ml Normal Saline once daily   traMADol (ULTRAM) 50 MG tablet Take 50 mg by mouth every 6 (six) hours as needed.   [DISCONTINUED] carvedilol (COREG) 3.125 MG tablet Take 3.125 mg by mouth 2 (two) times daily.     Allergies:   Patient has no known allergies.   Social History   Socioeconomic History   Marital status: Widowed    Spouse name: Not on file   Number of children: 3   Years of education: Not on file   Highest education level: Not on file  Occupational History   Occupation: retired  Tobacco Use   Smoking status:  Never   Smokeless tobacco: Never  Vaping Use   Vaping Use: Never used  Substance and Sexual Activity   Alcohol use: No   Drug use: No   Sexual activity: Not on file  Other Topics Concern   Not on file  Social History Narrative   Not on file   Social Determinants of Health   Financial Resource Strain: Low Risk  (03/27/2021)   Overall Financial Resource Strain (CARDIA)    Difficulty of Paying Living Expenses: Not hard at all  Food Insecurity: No Food Insecurity (02/08/2022)   Hunger Vital Sign    Worried About Running Out of Food in the Last Year: Never true    [place] in the Last Year: Never true  Transportation Needs: No Transportation Needs (01/28/2022)   PRAPARE - Hydrologist (Medical): No    Lack of Transportation (Non-Medical): No  Physical Activity: Insufficiently Active (03/27/2021)   Exercise Vital Sign    Days of Exercise per Week: 3 days    Minutes of Exercise per Session: 30 min  Stress: No Stress Concern Present (03/27/2021)   [place]    Feeling of Stress : Only a little  Social Connections: Moderately Integrated (03/27/2021)   Social Connection and Isolation Panel [NHANES]    Frequency of Communication with Friends and Family: More than three times a week    Frequency of Social Gatherings with Friends and Family: More than three times a week    Attends Religious Services: More than 4 times per year    Active Member of [employer] or Organizations: Yes    Attends Archivist Meetings: More than 4 times per year    Marital Status: Widowed     Family History: The patient's family history includes Heart attack in her father; Heart disease (age of onset: 38) in her father; Heart failure in her father; Hyperlipidemia in her father; Hypertension in her father; Pulmonary fibrosis in her son. There is no history of Breast cancer.  ROS:   Review of Systems   Constitutional:  Positive for malaise/fatigue (improving since hospital d/c).  HENT: Negative.    Eyes: Negative.   Respiratory: Negative.    Cardiovascular:  Positive  for leg swelling. Negative for chest pain, palpitations, orthopnea, claudication and PND.  Gastrointestinal:  Positive for abdominal pain (r/t biliary drain site). Negative for melena.  Genitourinary:  Negative for hematuria.  Musculoskeletal: Negative.   Skin: Negative.   Neurological:  Negative for dizziness.  Endo/Heme/Allergies: Negative.   Psychiatric/Behavioral: Negative.       EKGs/Labs/Other Studies Reviewed:    The following studies were reviewed today:  01/30/22 Myoview - The study is normal. Findings are consistent with no ischemia and no prior myocardial infarction. The study is low risk.  01/28/22 Echo complete - EF 60 to 65%, no RWMA, grade I DD, valves OK  01/27/22 Venous doppler - Calf Veins: The posterior tibial vein is occluded at the mid calf due to acute DVT   EKG:  EKG is ordered today.  The ekg ordered today demonstrates NSR, 73 bpm, consistent with previous tracings.   Recent Labs: 10/01/2021: TSH 1.73 01/27/2022: B Natriuretic Peptide 38.4 02/01/2022: ALT 11 02/03/2022: Magnesium 2.0 02/06/2022: BUN 39; Creatinine, Ser 2.13; Hemoglobin 8.7; Platelets 303; Potassium 3.5; Sodium 136  Recent Lipid Panel    Component Value Date/Time   CHOL 51 01/27/2022 1450   CHOL 160 11/21/2014 0951   TRIG 177 (H) 01/27/2022 1450   HDL <10 (L) 01/27/2022 1450   HDL 56 11/21/2014 0951   CHOLHDL UNABLE TO CALCULATE 01/27/2022 1450   VLDL 35 01/27/2022 1450   LDLCALC UNABLE TO CALCULATE 01/27/2022 1450   LDLCALC 43 12/26/2019 0851     Risk Assessment/Calculations:                Physical Exam:    VS:  BP 122/64 (BP Location: Left Arm, Patient Position: Sitting, Cuff Size: Normal)   Pulse 73   Ht '5\' 6"'$  (1.676 m)   Wt 229 lb 12.8 oz (104.2 kg) Comment: 229.8lb  SpO2 100%   BMI 37.09 kg/m      Wt Readings from Last 3 Encounters:  03/02/22 229 lb 12.8 oz (104.2 kg)  02/10/22 263 lb (119.3 kg)  02/04/22 227 lb 1.2 oz (103 kg)     GEN:  Well nourished, well developed in no acute distress HEENT: Normal NECK: No JVD; No carotid bruits LYMPHATICS: No lymphadenopathy CARDIAC: RRR, no murmurs, rubs, gallops RESPIRATORY:  Clear to auscultation without rales, wheezing or rhonchi  ABDOMEN: Soft, non-tender, non-distended, biliary drain present MUSCULOSKELETAL:  +2 pedal edema; No deformity  SKIN: Warm and dry NEUROLOGIC:  Alert and oriented x 3 PSYCHIATRIC:  Normal affect   ASSESSMENT:    1. Coronary artery disease of native artery of native heart with stable angina pectoris ([place])   2. Essential (primary) hypertension   3. Lower extremity edema   4. Acute deep vein thrombosis (DVT) of tibial vein of left lower extremity (HCC)   5. Hyperlipidemia, unspecified hyperlipidemia type   6. Acute cholecystitis   7. Preoperative cardiovascular examination    PLAN:    In order of problems listed above:  CAD - Denies chest pain or acute decompensation. Home health is coming to her home twice/day. Energy is slowing improving. Continue ASA 81 mg daily, metoprolol succinate 12.5 mg daily.  HTN - BP today 122/64 and is well controlled, continue metoprolol succinate 12.5 mg once daily.  Lower extremity edema - +2 pedal edema upper calf, weight is almost back to her baseline of 225 lbs, continue lasix 20 mg twice/day per nephrology recommendations. She was able to get her regular shoes back on today. Will see [facility]  later this week and see what recommendations they have for compression socks.  DVT - Will see [facility] on 03/05/21 to discuss possible IVC filter placement to she can proceed with cholecystectomy.  Continue Eliquis 5 mg twice/day.  HLD - Recent LDL of 50 05/2021 well controlled on current regimen, continue rosuvastatin 20 mg daily, zetia 10 mg daily. Acute cholecystitis - Followed by general  surgery, they are suggesting cholecystectomy d/t DVT, she is seeking a second opinion. Biliary drain is in place, dressing CDI.  Preoperative cardiovascular examination -   According to the Revised Cardiac Risk Index (RCRI), her Perioperative Risk of Major Cardiac Event is (%): 0.9  Her Functional Capacity in METs is: 5.81 according to the [facility] Activity Status Index (DASI).   Therefore, based on ACC/AHA guidelines, patient would be at acceptable risk for the planned procedure without further cardiovascular testing. I will route this recommendation to the requesting party via Epic fax function. `          Medication Adjustments/Labs and Tests Ordered: Current medicines are reviewed at length with the patient today.  Concerns regarding medicines are outlined above.  Orders Placed This Encounter  Procedures   EKG 12-Lead   No orders of the defined types were placed in this encounter.   Patient Instructions  Medication Instructions:  Your physician recommends that you continue on your current medications as directed. Please refer to the Current Medication list given to you today.  *If you need a refill on your cardiac medications before your next appointment, please call your pharmacy*  Lab Work: None ordered  If you have labs (blood work) drawn today and your tests are completely normal, you will receive your results only by: [place] (if you have MyChart) OR A paper copy in the mail If you have any lab test that is abnormal or we need to change your treatment, we will call you to review the results.   Testing/Procedures: None ordered  Follow-Up: At [facility], you and your health needs are our priority.  As part of our continuing mission to provide you with exceptional heart care, we have created designated Provider Care Teams.  These Care Teams include your primary Cardiologist (physician) and Advanced Practice Providers (APPs -  Physician Assistants and  Nurse Practitioners) who all work together to provide you with the care you need, when you need it.  We recommend signing up for the patient portal called "MyChart".  Sign up information is provided on this After Visit Summary.  MyChart is used to connect with patients for Virtual Visits (Telemedicine).  Patients are able to view lab/test results, encounter notes, upcoming appointments, etc.  Non-urgent messages can be sent to your provider as well.   To learn more about what you can do with MyChart, go to [email or web address].    Your next appointment:   3-6 month(s)  The format for your next appointment:   In Person  Provider:   Ida Rogue, MD   Important Information About Sugar         Signed, Trudi Ida, NP  03/02/2022 12:28 PM    [place]

## 2022-03-02 ENCOUNTER — Ambulatory Visit: Payer: Medicare Other | Attending: Physician Assistant | Admitting: Cardiology

## 2022-03-02 ENCOUNTER — Encounter: Payer: Self-pay | Admitting: Physician Assistant

## 2022-03-02 VITALS — BP 122/64 | HR 73 | Ht 66.0 in | Wt 229.8 lb

## 2022-03-02 DIAGNOSIS — E785 Hyperlipidemia, unspecified: Secondary | ICD-10-CM | POA: Diagnosis not present

## 2022-03-02 DIAGNOSIS — R600 Localized edema: Secondary | ICD-10-CM | POA: Diagnosis not present

## 2022-03-02 DIAGNOSIS — I10 Essential (primary) hypertension: Secondary | ICD-10-CM | POA: Diagnosis not present

## 2022-03-02 DIAGNOSIS — K810 Acute cholecystitis: Secondary | ICD-10-CM | POA: Diagnosis not present

## 2022-03-02 DIAGNOSIS — I82442 Acute embolism and thrombosis of left tibial vein: Secondary | ICD-10-CM

## 2022-03-02 DIAGNOSIS — I25118 Atherosclerotic heart disease of native coronary artery with other forms of angina pectoris: Secondary | ICD-10-CM

## 2022-03-02 DIAGNOSIS — Z01810 Encounter for preprocedural cardiovascular examination: Secondary | ICD-10-CM

## 2022-03-02 MED ORDER — METOPROLOL SUCCINATE ER 25 MG PO TB24: 12.5000 mg | ORAL_TABLET | Freq: Every day | ORAL | 1 refills | Status: DC

## 2022-03-02 MED ORDER — FUROSEMIDE 20 MG PO TABS: 20.0000 mg | ORAL_TABLET | Freq: Every day | ORAL | 0 refills | Status: DC | PRN

## 2022-03-02 MED ORDER — TRADJENTA 5 MG PO TABS: 5.0000 mg | ORAL_TABLET | Freq: Every day | ORAL | 1 refills | Status: DC

## 2022-03-02 NOTE — Patient Instructions (Addendum)
Medication Instructions:  Your physician recommends that you continue on your current medications as directed. Please refer to the Current Medication list given to you today.  *If you need a refill on your cardiac medications before your next appointment, please call your pharmacy*  Lab Work: None ordered  If you have labs (blood work) drawn today and your tests are completely normal, you will receive your results only by: [place] (if you have MyChart) OR A paper copy in the mail If you have any lab test that is abnormal or we need to change your treatment, we will call you to review the results.   Testing/Procedures: None ordered  Follow-Up: At [facility], you and your health needs are our priority.  As part of our continuing mission to provide you with exceptional heart care, we have created designated Provider Care Teams.  These Care Teams include your primary Cardiologist (physician) and Advanced Practice Providers (APPs -  Physician Assistants and Nurse Practitioners) who all work together to provide you with the care you need, when you need it.  We recommend signing up for the patient portal called "MyChart".  Sign up information is provided on this After Visit Summary.  MyChart is used to connect with patients for Virtual Visits (Telemedicine).  Patients are able to view lab/test results, encounter notes, upcoming appointments, etc.  Non-urgent messages can be sent to your provider as well.   To learn more about what you can do with MyChart, go to [email or web address].    Your next appointment:   3-6 month(s)  The format for your next appointment:   In Person  Provider:   Ida Rogue, MD   Important Information About Sugar

## 2022-03-04 ENCOUNTER — Encounter (INDEPENDENT_AMBULATORY_CARE_PROVIDER_SITE_OTHER): Payer: Self-pay | Admitting: Vascular Surgery

## 2022-03-04 DIAGNOSIS — K8080 Other cholelithiasis without obstruction: Secondary | ICD-10-CM | POA: Diagnosis not present

## 2022-03-05 ENCOUNTER — Telehealth: Payer: Self-pay | Admitting: Internal Medicine

## 2022-03-05 DIAGNOSIS — U071 COVID-19: Secondary | ICD-10-CM | POA: Diagnosis not present

## 2022-03-05 DIAGNOSIS — E1122 Type 2 diabetes mellitus with diabetic chronic kidney disease: Secondary | ICD-10-CM | POA: Diagnosis not present

## 2022-03-05 DIAGNOSIS — E039 Hypothyroidism, unspecified: Secondary | ICD-10-CM | POA: Diagnosis not present

## 2022-03-05 DIAGNOSIS — D509 Iron deficiency anemia, unspecified: Secondary | ICD-10-CM | POA: Diagnosis not present

## 2022-03-05 DIAGNOSIS — A419 Sepsis, unspecified organism: Secondary | ICD-10-CM | POA: Diagnosis not present

## 2022-03-05 DIAGNOSIS — E871 Hypo-osmolality and hyponatremia: Secondary | ICD-10-CM | POA: Diagnosis not present

## 2022-03-05 DIAGNOSIS — I214 Non-ST elevation (NSTEMI) myocardial infarction: Secondary | ICD-10-CM | POA: Diagnosis not present

## 2022-03-05 DIAGNOSIS — I2510 Atherosclerotic heart disease of native coronary artery without angina pectoris: Secondary | ICD-10-CM | POA: Diagnosis not present

## 2022-03-05 DIAGNOSIS — N1831 Chronic kidney disease, stage 3a: Secondary | ICD-10-CM | POA: Diagnosis not present

## 2022-03-05 DIAGNOSIS — I130 Hypertensive heart and chronic kidney disease with heart failure and stage 1 through stage 4 chronic kidney disease, or unspecified chronic kidney disease: Secondary | ICD-10-CM | POA: Diagnosis not present

## 2022-03-05 DIAGNOSIS — D696 Thrombocytopenia, unspecified: Secondary | ICD-10-CM | POA: Diagnosis not present

## 2022-03-05 DIAGNOSIS — Z7982 Long term (current) use of aspirin: Secondary | ICD-10-CM | POA: Diagnosis not present

## 2022-03-05 DIAGNOSIS — Z792 Long term (current) use of antibiotics: Secondary | ICD-10-CM | POA: Diagnosis not present

## 2022-03-05 DIAGNOSIS — E1169 Type 2 diabetes mellitus with other specified complication: Secondary | ICD-10-CM | POA: Diagnosis not present

## 2022-03-05 DIAGNOSIS — E1140 Type 2 diabetes mellitus with diabetic neuropathy, unspecified: Secondary | ICD-10-CM | POA: Diagnosis not present

## 2022-03-05 DIAGNOSIS — I5032 Chronic diastolic (congestive) heart failure: Secondary | ICD-10-CM | POA: Diagnosis not present

## 2022-03-05 DIAGNOSIS — Z7901 Long term (current) use of anticoagulants: Secondary | ICD-10-CM | POA: Diagnosis not present

## 2022-03-05 DIAGNOSIS — N179 Acute kidney failure, unspecified: Secondary | ICD-10-CM | POA: Diagnosis not present

## 2022-03-05 DIAGNOSIS — G4733 Obstructive sleep apnea (adult) (pediatric): Secondary | ICD-10-CM | POA: Diagnosis not present

## 2022-03-05 DIAGNOSIS — Z7984 Long term (current) use of oral hypoglycemic drugs: Secondary | ICD-10-CM | POA: Diagnosis not present

## 2022-03-05 DIAGNOSIS — K819 Cholecystitis, unspecified: Secondary | ICD-10-CM | POA: Diagnosis not present

## 2022-03-05 NOTE — Progress Notes (Addendum)
Pt called stating she had cholecystostomy drain exchanged/upsized 02/23/22. She flushed this a.m. and reports that she had to use more pressure to flush with some discomfort. She reports good OP in drain, sutures in place and no leaking around site. Pt was advised to flush, aspirate and flush again to make sure there is no blockage in the drain. Pt verbalized understanding of instructions and ageed with plan to call if she has any other issues.     Narda Rutherford, AGNP-BC 03/05/2022, 12:51 PM

## 2022-03-08 ENCOUNTER — Other Ambulatory Visit: Payer: Self-pay

## 2022-03-08 ENCOUNTER — Telehealth: Payer: Self-pay | Admitting: Student

## 2022-03-08 ENCOUNTER — Ambulatory Visit: Payer: Medicare Other | Admitting: Surgery

## 2022-03-08 DIAGNOSIS — Z7901 Long term (current) use of anticoagulants: Secondary | ICD-10-CM | POA: Diagnosis not present

## 2022-03-08 DIAGNOSIS — T8143XA Infection following a procedure, organ and space surgical site, initial encounter: Secondary | ICD-10-CM | POA: Insufficient documentation

## 2022-03-08 DIAGNOSIS — R109 Unspecified abdominal pain: Secondary | ICD-10-CM | POA: Diagnosis present

## 2022-03-08 DIAGNOSIS — K8013 Calculus of gallbladder with acute and chronic cholecystitis with obstruction: Secondary | ICD-10-CM | POA: Diagnosis not present

## 2022-03-08 DIAGNOSIS — Z96653 Presence of artificial knee joint, bilateral: Secondary | ICD-10-CM | POA: Insufficient documentation

## 2022-03-08 DIAGNOSIS — N189 Chronic kidney disease, unspecified: Secondary | ICD-10-CM | POA: Insufficient documentation

## 2022-03-08 DIAGNOSIS — K8020 Calculus of gallbladder without cholecystitis without obstruction: Secondary | ICD-10-CM | POA: Diagnosis not present

## 2022-03-08 DIAGNOSIS — Y658 Other specified misadventures during surgical and medical care: Secondary | ICD-10-CM | POA: Diagnosis not present

## 2022-03-08 DIAGNOSIS — Z79899 Other long term (current) drug therapy: Secondary | ICD-10-CM | POA: Diagnosis not present

## 2022-03-08 DIAGNOSIS — I700 Atherosclerosis of aorta: Secondary | ICD-10-CM | POA: Diagnosis not present

## 2022-03-08 DIAGNOSIS — E1122 Type 2 diabetes mellitus with diabetic chronic kidney disease: Secondary | ICD-10-CM | POA: Insufficient documentation

## 2022-03-08 DIAGNOSIS — I129 Hypertensive chronic kidney disease with stage 1 through stage 4 chronic kidney disease, or unspecified chronic kidney disease: Secondary | ICD-10-CM | POA: Insufficient documentation

## 2022-03-08 DIAGNOSIS — Z8679 Personal history of other diseases of the circulatory system: Secondary | ICD-10-CM | POA: Diagnosis not present

## 2022-03-08 DIAGNOSIS — K6811 Postprocedural retroperitoneal abscess: Secondary | ICD-10-CM | POA: Diagnosis not present

## 2022-03-08 DIAGNOSIS — Z7982 Long term (current) use of aspirin: Secondary | ICD-10-CM | POA: Insufficient documentation

## 2022-03-08 DIAGNOSIS — Z8542 Personal history of malignant neoplasm of other parts of uterus: Secondary | ICD-10-CM | POA: Insufficient documentation

## 2022-03-08 DIAGNOSIS — R000 Tachycardia, unspecified: Secondary | ICD-10-CM | POA: Diagnosis not present

## 2022-03-08 LAB — CBC WITH DIFFERENTIAL/PLATELET
Abs Immature Granulocytes: 0.04 10*3/uL (ref 0.00–0.07)
Basophils Absolute: 0 10*3/uL (ref 0.0–0.1)
Basophils Relative: 0 %
Eosinophils Absolute: 0.2 10*3/uL (ref 0.0–0.5)
Eosinophils Relative: 1 %
HCT: 33 % — ABNORMAL LOW (ref 36.0–46.0)
Hemoglobin: 9.9 g/dL — ABNORMAL LOW (ref 12.0–15.0)
Immature Granulocytes: 0 %
Lymphocytes Relative: 10 %
Lymphs Abs: 1.4 10*3/uL (ref 0.7–4.0)
MCH: 28.9 pg (ref 26.0–34.0)
MCHC: 30 g/dL (ref 30.0–36.0)
MCV: 96.2 fL (ref 80.0–100.0)
Monocytes Absolute: 0.9 10*3/uL (ref 0.1–1.0)
Monocytes Relative: 7 %
Neutro Abs: 11.2 10*3/uL — ABNORMAL HIGH (ref 1.7–7.7)
Neutrophils Relative %: 82 %
Platelets: 386 10*3/uL (ref 150–400)
RBC: 3.43 MIL/uL — ABNORMAL LOW (ref 3.87–5.11)
RDW: 15.1 % (ref 11.5–15.5)
WBC: 13.8 10*3/uL — ABNORMAL HIGH (ref 4.0–10.5)
nRBC: 0 % (ref 0.0–0.2)

## 2022-03-08 LAB — COMPREHENSIVE METABOLIC PANEL
ALT: 30 U/L (ref 0–44)
AST: 40 U/L (ref 15–41)
Albumin: 2.6 g/dL — ABNORMAL LOW (ref 3.5–5.0)
Alkaline Phosphatase: 127 U/L — ABNORMAL HIGH (ref 38–126)
Anion gap: 9 (ref 5–15)
BUN: 12 mg/dL (ref 8–23)
CO2: 25 mmol/L (ref 22–32)
Calcium: 8 mg/dL — ABNORMAL LOW (ref 8.9–10.3)
Chloride: 98 mmol/L (ref 98–111)
Creatinine, Ser: 0.8 mg/dL (ref 0.44–1.00)
GFR, Estimated: >60 mL/min (ref >60)
Glucose, Bld: 279 mg/dL — ABNORMAL HIGH (ref 70–99)
Potassium: 3.9 mmol/L (ref 3.5–5.1)
Sodium: 132 mmol/L — ABNORMAL LOW (ref 135–145)
Total Bilirubin: 0.7 mg/dL (ref 0.3–1.2)
Total Protein: 7.6 g/dL (ref 6.5–8.1)

## 2022-03-08 LAB — LIPASE, BLOOD: Lipase: 27 U/L (ref 11–51)

## 2022-03-08 LAB — LACTIC ACID, PLASMA: Lactic Acid, Venous: 1.8 mmol/L (ref 0.5–1.9)

## 2022-03-08 NOTE — Progress Notes (Signed)
Patient called with concern of significant pain around her cholecystostomy drain site, with significant green drainage reported by the patient on her bandage with enough volume to soak through the bandage. The patient states she has not been having a fever. Further evaluation limited by encounter being over the telephone. After coordinating with IR schedulers, the IR scheduler will reach out to the patient with an appointment time to be seen in clinic as soon as possible.

## 2022-03-08 NOTE — ED Triage Notes (Signed)
Pt presents to ER with daughter with c/o pain to right side of abdomen.  Pt had gall bladder drain placed before christmas, and then had drain replaced 02/24/22.  Pt states she has had drainage from incision site that started on 1/5 and has been monitoring it.  Pt now reports that she is having pain to abdomen and was running a fever today.  Pt states she is unable to flush out drain.  Pt is otherwise A&O x4 in NAD.    Pt states she was running fever pta, but took 1g tylenol at 2100.

## 2022-03-09 ENCOUNTER — Emergency Department: Payer: Medicare Other

## 2022-03-09 ENCOUNTER — Emergency Department
Admission: EM | Admit: 2022-03-09 | Discharge: 2022-03-09 | Disposition: A | Discharge status: Home / Self Care | Payer: Medicare Other | Attending: Emergency Medicine | Admitting: Emergency Medicine

## 2022-03-09 ENCOUNTER — Encounter: Payer: Self-pay | Admitting: Family Medicine

## 2022-03-09 DIAGNOSIS — T8143XA Infection following a procedure, organ and space surgical site, initial encounter: Secondary | ICD-10-CM

## 2022-03-09 DIAGNOSIS — K8013 Calculus of gallbladder with acute and chronic cholecystitis with obstruction: Secondary | ICD-10-CM

## 2022-03-09 DIAGNOSIS — Z434 Encounter for attention to other artificial openings of digestive tract: Secondary | ICD-10-CM | POA: Diagnosis not present

## 2022-03-09 DIAGNOSIS — I700 Atherosclerosis of aorta: Secondary | ICD-10-CM | POA: Diagnosis not present

## 2022-03-09 DIAGNOSIS — K828 Other specified diseases of gallbladder: Secondary | ICD-10-CM | POA: Diagnosis not present

## 2022-03-09 DIAGNOSIS — I129 Hypertensive chronic kidney disease with stage 1 through stage 4 chronic kidney disease, or unspecified chronic kidney disease: Secondary | ICD-10-CM

## 2022-03-09 DIAGNOSIS — R109 Unspecified abdominal pain: Secondary | ICD-10-CM | POA: Diagnosis not present

## 2022-03-09 LAB — URINALYSIS, ROUTINE W REFLEX MICROSCOPIC
Bilirubin Urine: NEGATIVE
Glucose, UA: >=500 mg/dL — AB
Hgb urine dipstick: NEGATIVE
Ketones, ur: NEGATIVE mg/dL
Leukocytes,Ua: NEGATIVE
Nitrite: POSITIVE — AB
Protein, ur: 30 mg/dL — AB
Specific Gravity, Urine: 1.04 — ABNORMAL HIGH (ref 1.005–1.030)
Squamous Epithelial / HPF: NONE SEEN /HPF (ref 0–5)
pH: 6 (ref 5.0–8.0)

## 2022-03-09 LAB — LACTIC ACID, PLASMA: Lactic Acid, Venous: 1.4 mmol/L (ref 0.5–1.9)

## 2022-03-09 MED ORDER — LACTATED RINGERS IV BOLUS
250.0000 mL | Freq: Once | INTRAVENOUS | Status: AC
  Administered 2022-03-09: 250 mL via INTRAVENOUS

## 2022-03-09 MED ORDER — SODIUM CHLORIDE 0.9 % IV SOLN: 2.0000 g | INTRAVENOUS | Status: DC

## 2022-03-09 MED ORDER — METOPROLOL SUCCINATE ER 25 MG PO TB24: 12.5000 mg | ORAL_TABLET | Freq: Every day | ORAL | 0 refills | Status: DC

## 2022-03-09 MED ORDER — ACETAMINOPHEN 325 MG PO TABS
650.0000 mg | ORAL_TABLET | Freq: Once | ORAL | Status: AC
  Administered 2022-03-09: 650 mg via ORAL
  Filled 2022-03-09: qty 2

## 2022-03-09 MED ORDER — MORPHINE SULFATE (PF) 2 MG/ML IV SOLN
2.0000 mg | Freq: Once | INTRAVENOUS | Status: AC
  Administered 2022-03-09: 2 mg via INTRAVENOUS
  Filled 2022-03-09: qty 1

## 2022-03-09 MED ORDER — SODIUM CHLORIDE 0.9 % IV SOLN
2.0000 g | Freq: Once | INTRAVENOUS | Status: AC
  Administered 2022-03-09: 2 g via INTRAVENOUS
  Filled 2022-03-09: qty 20

## 2022-03-09 MED ORDER — IOHEXOL 350 MG/ML SOLN
100.0000 mL | Freq: Once | INTRAVENOUS | Status: AC | PRN
  Administered 2022-03-09: 100 mL via INTRAVENOUS

## 2022-03-09 MED ORDER — AMOXICILLIN-POT CLAVULANATE 875-125 MG PO TABS: 1.0000 | ORAL_TABLET | Freq: Two times a day (BID) | ORAL | 0 refills | Status: AC

## 2022-03-09 MED ORDER — MORPHINE SULFATE (PF) 4 MG/ML IV SOLN
4.0000 mg | Freq: Once | INTRAVENOUS | Status: AC
  Administered 2022-03-09: 4 mg via INTRAVENOUS
  Filled 2022-03-09: qty 1

## 2022-03-09 MED ORDER — METRONIDAZOLE 500 MG/100ML IV SOLN
500.0000 mg | Freq: Three times a day (TID) | INTRAVENOUS | Status: DC
  Administered 2022-03-09: 500 mg via INTRAVENOUS
  Filled 2022-03-09: qty 100

## 2022-03-09 NOTE — ED Provider Notes (Signed)
[facility] Provider Note    Event Date/Time   First MD Initiated Contact with Patient 03/09/22 [phone]     (approximate)   History   Chief Complaint: Abdominal Pain   HPI  [name] is a 77 y.o. female with a history of hypertension, diabetes, CKD, who comes ED complaining of abnormal drainage through her cholecystostomy exit site outside of the tubing.  Patient initially had gallbladder obstruction from a 3 cm gallstone, underwent percutaneous cholecystostomy drainage around December 20.  It has been replaced 2 times since then.  Yesterday she started having increased fevers, and when she tried to flush the drain, purulent fluid leaked out of her abdominal wall.  She is also having some increased abdominal pain.     Physical Exam   Triage Vital Signs: ED Triage Vitals [03/08/22 2239]  Enc Vitals Group     BP (!) 148/76     Pulse Rate (!) 104     Resp 20     Temp 98.3 F (36.8 C)     Temp Source Oral     SpO2 98 %     Weight 231 lb (104.8 kg)     Height '5\' 6"'$  (1.676 m)     Head Circumference      Peak Flow      Pain Score 6     Pain Loc      Pain Edu?      Excl. in [place]?     Most recent vital signs: Vitals:   03/08/22 2239 03/09/22 0429  BP: (!) 148/76 (!) 126/56  Pulse: (!) 104 86  Resp: 20 19  Temp: 98.3 F (36.8 C) 98.7 F (37.1 C)  SpO2: 98% 100%    General: Awake, no distress.  CV:  Good peripheral perfusion.  Regular rate and rhythm Resp:  Normal effort.  Clear to auscultation bilaterally Abd:  No distention.  Soft with mild diffuse right-sided tenderness. Other:  Somewhat dry mucous membranes.   ED Results / Procedures / Treatments   Labs (all labs ordered are listed, but only abnormal results are displayed) Labs Reviewed  CBC WITH DIFFERENTIAL/PLATELET - Abnormal; Notable for the following components:      Result Value   WBC 13.8 (*)    RBC 3.43 (*)    Hemoglobin 9.9 (*)    HCT 33.0 (*)    Neutro Abs 11.2 (*)     All other components within normal limits  COMPREHENSIVE METABOLIC PANEL - Abnormal; Notable for the following components:   Sodium 132 (*)    Glucose, Bld 279 (*)    Calcium 8.0 (*)    Albumin 2.6 (*)    Alkaline Phosphatase 127 (*)    All other components within normal limits  URINALYSIS, ROUTINE W REFLEX MICROSCOPIC - Abnormal; Notable for the following components:   Color, Urine YELLOW (*)    APPearance HAZY (*)    Specific Gravity, Urine 1.040 (*)    Glucose, UA >=500 (*)    Protein, ur 30 (*)    Nitrite POSITIVE (*)    Bacteria, UA MANY (*)    All other components within normal limits  CULTURE, BLOOD (ROUTINE X 2)  CULTURE, BLOOD (ROUTINE X 2)  URINE CULTURE  LIPASE, BLOOD  LACTIC ACID, PLASMA  LACTIC ACID, PLASMA     EKG Interpreted by me Sinus tachycardia rate 103.  Right axis, normal intervals.  Poor R wave progression.  Normal ST segments and T waves  RADIOLOGY CT abdomen pelvis interpreted by me, shows cholecystostomy tube in position in the gallbladder.  Gallbladder is distended.  Radiology report reviewed noting increased signs of cholecystitis as well as a pelvic abscess.   PROCEDURES:  Procedures   MEDICATIONS ORDERED IN ED: Medications  cefTRIAXone (ROCEPHIN) 2 g in sodium chloride 0.9 % 100 mL IVPB (2 g Intravenous New Bag/Given 03/09/22 0535)  metroNIDAZOLE (FLAGYL) IVPB 500 mg (500 mg Intravenous New Bag/Given 03/09/22 0557)  iohexol (OMNIPAQUE) 350 MG/ML injection 100 mL (100 mLs Intravenous Contrast Given 03/09/22 0240)  morphine (PF) 2 MG/ML injection 2 mg (2 mg Intravenous Given 03/09/22 0530)  lactated ringers bolus 250 mL (0 mLs Intravenous Stopped 03/09/22 0555)     IMPRESSION / MDM / ASSESSMENT AND PLAN / ED COURSE  I reviewed the triage vital signs and the nursing notes.                              Differential diagnosis includes, but is not limited to, cholecystitis, intra-abdominal abscess, GI perforation, dehydration, electrolyte  abnormality  Patient's presentation is most consistent with acute presentation with potential threat to life or bodily function.  Patient presents with cholecystostomy tube dysfunction, fever, increased abdominal pain.  Found to have acute on chronic cholecystitis of the gallbladder which has a large gallstone.  Also has an intra-abdominal abscess in the pelvis. She is not septic.  Contacted [facility] transfer center who has contacted her surgeon, Dr. Narda Bonds, who plans to have patient transferred to [facility] today.  Due to lack of bed availability there and logistical barriers, ED team will need to recontact [facility] transfer center after 7:30 AM today to finalize the transfer process.  Anticipate patient will be transported directly to the [facility] preop holding area.       FINAL CLINICAL IMPRESSION(S) / ED DIAGNOSES   Final diagnoses:  Calculus of gallbladder with acute on chronic cholecystitis with obstruction  Postprocedural intraabdominal abscess     Rx / DC Orders   ED Discharge Orders     None        Note:  This document was prepared using Dragon voice recognition software and may include unintentional dictation errors.   Carrie Mew, MD 03/09/22 [phone]

## 2022-03-09 NOTE — Discharge Instructions (Signed)
Contact Dr. Narda Bonds for follow-up visit to schedule your gallbladder surgery.  Take care of the drain as advised by interventional radiology  Take antibiotics for 7 days.  Thank you for choosing [place] for your health care today!  Please see your primary doctor this week for a follow up appointment.   Sometimes, in the early stages of certain disease courses it is difficult to detect in the emergency department evaluation -- so, it is important that you continue to monitor your symptoms and call your doctor right away or return to the emergency department if you develop any new or worsening symptoms.  Please go to the following website to schedule new (and existing) patient appointments:   [email or web address]  If you do not have a primary doctor try calling the following clinics to establish care:  If you have insurance:  [facility] [phone] [place] [address]   [facility]  [phone] [place] [place] [address]   If you do not have insurance:  [facility]  [phone] [address] [place] [address]   The following is another list of primary care offices in the area who are accepting new patients at this time.  Please reach out to one of them directly and let them know you would like to schedule an appointment to follow up on an Emergency Department visit, and/or to establish a new primary care provider (PCP).  There are likely other primary care clinics in the are who are accepting new patients, but this is an excellent place to start:  [place] physician: Dr Lavon Paganini [address] [place], [place] [address] [phone]  [facility] Lead Physician: Dr Steele Sizer [address], [place], [place] [address] [phone]  [place] Physician: Dr Park Liter [address] [place], [place] [address] [phone]  [facility] Lead Physician: Dr Dewaine Oats [place], [place], [place] [address] [phone]  [facility] Physician: Dr Halina Maidens [address] [place], [place] [address] [phone]   It was my pleasure to care for you today.   Hoover Brunette Jacelyn Grip, MD

## 2022-03-09 NOTE — Progress Notes (Signed)
Referring Physician(s): Carrie Mew, MD  Supervising Physician: Michaelle Birks  Patient Status:  [facility] - In-pt  Chief Complaint:  S/p perc chole placement 01/28/22, dislodgement/replacement 01/31/22 and upsize to 12 F drain 02/23/22. She presents to ED c/o abd pain and leaking around insertion site  Subjective:  Pt reports leaking around insertion site when flushing PTA.  She endorses fever and increased pain around site.  She reports that drain is flushing well and OP to bag remains about the same as before.    Allergies: Patient has no known allergies.  Medications: Prior to Admission medications   Medication Sig Start Date End Date Taking? Authorizing Provider  acetaminophen (TYLENOL) 650 MG CR tablet Take 1,950 mg by mouth daily.    [provider]  apixaban (ELIQUIS) 5 MG TABS tablet Take 1 tablet (5 mg total) by mouth 2 (two) times daily. 02/25/22   Karamalegos, Devonne Doughty, DO  aspirin EC (ASPIRIN 81) 81 MG tablet Take 81 mg by mouth daily.    [provider]  bisacodyl (DULCOLAX) 5 MG EC tablet Take 2 tablets (10 mg total) by mouth daily as needed for moderate constipation. 12/11/21   Thornton Park, MD  busPIRone (BUSPAR) 5 MG tablet Take 1 tablet (5 mg total) by mouth 3 (three) times daily as needed (anxiety). 02/15/22   Karamalegos, Devonne Doughty, DO  calcium carbonate (TUMS - DOSED IN MG ELEMENTAL CALCIUM) 500 MG chewable tablet Chew 2 tablets (400 mg of elemental calcium total) by mouth 2 (two) times daily. 02/06/22   Loletha Grayer, MD  ezetimibe (ZETIA) 10 MG tablet TAKE 1 TABLET BY MOUTH DAILY 07/02/21   Minna Merritts, MD  FLUoxetine (PROZAC) 10 MG capsule TAKE 1 CAPSULE BY MOUTH DAILY 12/07/21   Karamalegos, Devonne Doughty, DO  fluticasone (FLONASE) 50 MCG/ACT nasal spray Place 2 sprays into both nostrils daily. Patient taking differently: Place 2-3 sprays into both nostrils at bedtime as needed. 02/18/21   Brunetta Jeans, PA-C  furosemide  (LASIX) 20 MG tablet Take 1 tablet (20 mg total) by mouth daily as needed for edema or fluid. 03/02/22   Trudi Ida, NP  Lancets [facility] ULTRASOFT) lancets Use to check blood sugar daily 02/15/22   Olin Hauser, DO  levothyroxine (SYNTHROID) 125 MCG tablet TAKE 1 TABLET BY MOUTH  DAILY BEFORE BREAKFAST 09/29/21   Karamalegos, Devonne Doughty, DO  metoprolol succinate (TOPROL XL) 25 MG 24 hr tablet Take 0.5 tablets (12.5 mg total) by mouth at bedtime. 03/02/22   Karamalegos, Devonne Doughty, DO  Multiple Vitamin (MULTIVITAMIN WITH MINERALS) TABS tablet Take 1 tablet by mouth daily. 02/06/22   Loletha Grayer, MD  nitroGLYCERIN (NITROSTAT) 0.4 MG SL tablet Place 1 tablet (0.4 mg total) under the tongue every 5 (five) minutes as needed for chest pain. 01/29/20   Minna Merritts, MD  ondansetron (ZOFRAN-ODT) 4 MG disintegrating tablet Take 1 tablet (4 mg total) by mouth every 8 (eight) hours as needed for nausea or vomiting. 02/10/22   Parks Ranger, Devonne Doughty, DO  ONETOUCH ULTRA test strip Use to check blood sugar daily 02/15/22   Karamalegos, Devonne Doughty, DO  polyethylene glycol (MIRALAX / GLYCOLAX) 17 g packet Take 17 g by mouth daily as needed. 02/06/22   Loletha Grayer, MD  rosuvastatin (CRESTOR) 20 MG tablet TAKE 1 TABLET BY MOUTH DAILY 07/28/21   Minna Merritts, MD  sodium bicarbonate 650 MG tablet Take 2 tablets (1,300 mg total) by mouth daily. 02/06/22   Wieting,  Richard, MD  sodium chloride flush 0.9 % SOLN injection 10 mLs by Intracatheter route as needed. Flush gallbladder drain with 5-10 ml Normal Saline once daily 02/05/22 04/02/22  Theresa Duty, NP  TRADJENTA 5 MG TABS tablet Take 1 tablet (5 mg total) by mouth daily. 03/02/22   Karamalegos, Devonne Doughty, DO  traMADol (ULTRAM) 50 MG tablet Take 50 mg by mouth every 6 (six) hours as needed. 12/23/21   [provider]     Vital Signs: BP (!) 107/50   Pulse 87   Temp 99.5 F (37.5 C) (Oral)   Resp 16   Ht '5\' 6"'$   (1.676 m)   Wt 231 lb (104.8 kg)   SpO2 94%   BMI 37.28 kg/m   Physical Exam Vitals reviewed.  Constitutional:      General: She is not in acute distress.    Appearance: Normal appearance. She is not ill-appearing.  Pulmonary:     Effort: Pulmonary effort is normal. No respiratory distress.  Abdominal:     Comments: Drain flushes/aspirates easily. Insertion site has circumferential skin irritation. Purulent, yellow discharge noted around insertion site. Sutures/statlock in place. ~ 50 cc serosanguineous  OP in gravity bag. Dressing change completed.    Skin:    General: Skin is warm and dry.  Neurological:     Mental Status: She is alert and oriented to person, place, and time.  Psychiatric:        Mood and Affect: Mood normal.        Behavior: Behavior normal.        Thought Content: Thought content normal.        Judgment: Judgment normal.     Imaging: CT ABDOMEN PELVIS W CONTRAST  Result Date: 03/09/2022 CLINICAL DATA:  Right-sided abdominal pain. Gallbladder drain placed before Christmas and then replaced 02/24/2022. Drainage from incision site. New fever. EXAM: CT ABDOMEN AND PELVIS WITH CONTRAST TECHNIQUE: Multidetector CT imaging of the abdomen and pelvis was performed using the standard protocol following bolus administration of intravenous contrast. RADIATION DOSE REDUCTION: This exam was performed according to the departmental dose-optimization program which includes automated exposure control, adjustment of the mA and/or kV according to patient size and/or use of iterative reconstruction technique. CONTRAST:  171m OMNIPAQUE IOHEXOL 350 MG/ML SOLN COMPARISON:  CT 01/30/2022 FINDINGS: Lower chest: Bibasilar atelectasis/consolidation. Coronary artery calcification. No acute abnormality. Hepatobiliary: Percutaneous cholecystostomy tube within the gallbladder. Mild edema along the tract without fluid collection or abscess. The gallbladder is distended. Gallstone in the  gallbladder neck. Gallbladder wall thickening and hyperemia. Small amount of pericholecystic fluid. The common bile duct is dilated measuring 1.1 cm in diameter. Mild intrahepatic biliary dilation. No focal hepatic lesion. Redemonstrated poor enhancement of the left portal vein compatible with known portal vein thrombosis. The main and right portal vein are patent. Pancreas: Fatty atrophy of the pancreas. Spleen: Unremarkable. Adrenals/Urinary Tract: Normal adrenal glands. Multiple low-attenuation lesions in both kidneys are too small to definitively characterize but statistically likely to represent cysts. No specific follow-up recommended. No urinary calculi or hydronephrosis. Unremarkable bladder. Stomach/Bowel: Normal caliber large and small bowel. Large colonic stool load. Normal appendix. Vascular/Lymphatic: Aortic atherosclerosis. No enlarged abdominal or pelvic lymph nodes. Reproductive: Hysterectomy. Other: Peripherally enhancing mildly thick-walled fluid collection in the anterior right pelvis measures 7.3 x 6.4 x 5.9 cm. This is below the inferior aspect of the scan on 01/30/2022 and was not present on 01/27/2022. Adjacent edema/stranding. Small amount of free fluid in the pelvis and right  pericolic gutter. No free intraperitoneal air. Musculoskeletal: No acute osseous abnormality diffuse body wall edema. IMPRESSION: Cholecystostomy tube with pigtail portion in the gallbladder fundus. The gallbladder is distended with large stone in the gallbladder neck. There is wall thickening and pericholecystic fluid slightly increased from prior. Clinical correlation recommended to ensure the drain is properly functioning. Peripherally enhancing mildly thick-walled 7.3 cm fluid collection in the anterior right pelvis concerning for abscess. This is below the inferior aspect of the scan on 01/30/2022. Large colonic stool burden.  Correlate for constipation. Redemonstrated chronic left portal vein thrombus. Aortic  Atherosclerosis (ICD10-I70.0). Body wall anasarca. Electronically Signed   By: Placido Sou M.D.   On: 03/09/2022 03:02    Labs:  CBC: Recent Labs    02/03/22 0430 02/04/22 0814 02/05/22 0542 02/06/22 0526 03/08/22 2248  WBC 24.0*  --  14.5* 18.4* 13.8*  HGB 7.5* 7.5* 8.5* 8.7* 9.9*  HCT 23.1*  --  26.0* 26.2* 33.0*  PLT 238  --  274 303 386    COAGS: Recent Labs    11/30/21 1000 01/27/22 1446  INR 1.1 1.1  APTT  --  42*    BMP: Recent Labs    02/04/22 0814 02/05/22 0542 02/06/22 0526 03/08/22 2248  NA 130* 130* 136 132*  K 3.8 3.2* 3.5 3.9  CL 106 108 113* 98  CO2 16* 17* 20* 25  GLUCOSE 232* 231* 157* 279*  BUN 50* 50* 39* 12  CALCIUM 6.9* 7.0* 7.4* 8.0*  CREATININE 3.41* 2.99* 2.13* 0.80  GFRNONAA 13* 16* 24* >60    LIVER FUNCTION TESTS: Recent Labs    01/27/22 0942 01/31/22 0508 02/01/22 0420 03/08/22 2248  BILITOT 0.6 1.7* 1.5* 0.7  AST 35 19 21 40  ALT '20 11 11 30  '$ ALKPHOS 115 96 101 127*  PROT 6.0* 5.1* 5.1* 7.6  ALBUMIN 2.3* 1.7* 1.6* 2.6*    Assessment and Plan:  Drain assessed at bedside in the ED.  Drain F/A easily with serosanguineous OP with no leaking of fluid around insertion site. Gravity bag removed, drain placed to JP.  Pt educated on JP drain care.   WBC 13.8 Tbili 0.7 Tmax 100.6, VSS  Plan:  Follow up with surgery to proceed with discussed plan.  Change drain flushing at home from 5 cc once per day to 5 cc three times daily with normal saline.  Keep dressing clean, dry and intact.  Record daily OP.   Call IR with questions or concerns.    Electronically Signed: Tyson Alias, NP 03/09/2022, 9:53 AM   I spent a total of 25 Minutes at the the patient's bedside AND on the patient's hospital floor or unit, greater than 50% of which was counseling/coordinating care for cholecystostomy drain.

## 2022-03-09 NOTE — ED Notes (Signed)
Called [facility] transfer center for transfer per Stafford,MD. Images power shared

## 2022-03-09 NOTE — Consult Note (Signed)
[facility] SURGICAL CONSULTATION NOTE (initial) - cpt: [id number]   HISTORY OF PRESENT ILLNESS (HPI):  77 y.o. female presented to [facility] ED today for evaluation of cholecystostomy problem. Patient with recent history of acute cholecystitis with admission from 11/29 - 12/09 for similar. Unfortunately was on Eliquis for provoked DVT following orthopedic procedure and decision was made to have cholecystostomy tube placed on 11/30. The was replaced on 12/03 and then again on 12/26. She was ultimately discharged home on 12/09. She elected to follow up with [facility] and is seeing Dr Narda Bonds there. She presented to the ED overnight for evaluation of drainage around her cholecystostomy tube. She described this as purulent in nature. Increasing over the last few days. No increase in abdominal pain. She tried flushing this without significant improvements. Work up here revealed very mild leukocytosis to 13.8K and labs were otherwise reassuring. CT Abdomen/Pelvis showed appropriately placed drained without gross abscess around gallbladder nor around drain itself. Initial discussion with [facility] overnight resulted in acceptance with plan for surgery; however, there was no beds and asked to call back this AM. This morning, [facility] was unable to accept patient and surgery team did not feel she was appropriate for surgery at this time. Of note, she continues to be on Eliquis.   Surgery is consulted by emergency medicine physician Dr. Lucillie Garfinkel, MD in this context for evaluation and management of her cholecystostomy.  PAST MEDICAL HISTORY (PMH):  Past Medical History:  Diagnosis Date   Coronary artery disease    a. 07/2013 STEMI/PCI ([place]): LM nl, LAD 100p (thrombectomy & 3.5x23 Alpine DES), LCX nl, RCA nl, EF 45%.   Depression    Diastolic dysfunction    a.) TTE 11/26/2020 - LVEF 50-55%; G2DD.   Endometrial cancer ([place])    Gravida 3 para 3    Hyperlipidemia    Hypertension    Hypertensive heart disease     Hypothyroid    Ischemic cardiomyopathy    a.) 07/2013 EF 45% @ time of STEMI; b.) 11/2013 Echo: EF 55-60%, mildly dil LA, nl RV fxn. c.) TTE 07/29/2015 - LVEF 55-60%, mild LA and RV dilation. d.) TTE 11/26/2020 - LVEF 50-55%; G2DD.   Menopause    Obstructive sleep apnea on CPAP    Osteoarthritis    PONV (postoperative nausea and vomiting)    ST elevation myocardial infarction (STEMI) of anterolateral wall ([place]) 08/14/2013   a.) LVEF reduced at 45%; Tx'd with trombectomy and PCI to 100% LAD; 3.5 x 23 mm Xience Alpine DES placed.   T2DM (type 2 diabetes mellitus) ([place])    Thyroid cancer ([place])    a.) s/p total thyroidectomy. b.) on daily levothyroxine.     PAST SURGICAL HISTORY [facility]):  Past Surgical History:  Procedure Laterality Date   CATARACT EXTRACTION W/PHACO Left 08/30/2016   Procedure: CATARACT EXTRACTION PHACO AND INTRAOCULAR LENS PLACEMENT ([place]) Left daibetic;  Surgeon: Leandrew Koyanagi, MD;  Location: [place];  Service: Ophthalmology;  Laterality: Left;  Diabetic - insulin and oral meds sleep apnea   CATARACT EXTRACTION W/PHACO Right 10/07/2016   Procedure: CATARACT EXTRACTION PHACO AND INTRAOCULAR LENS PLACEMENT (IOC);  Surgeon: Leandrew Koyanagi, MD;  Location: [facility];  Service: Ophthalmology;  Laterality: Right;  Lot# [id number] H [place]: 00:43.3 AP%: 12.2 CDE: 5.28   CORONARY ANGIOPLASTY WITH STENT PLACEMENT Left 08/14/2013   Procedure: CORONARY ANGIOPLASTY WITH STENT PLACEMENT (3.5 x 23 mm Xience Alpine DES to LAD); Location: [facility] [place]; Surgeon: Algernon Huxley, MD  GANGLION CYST EXCISION Right 1980   IR EXCHANGE BILIARY DRAIN  02/23/2022   IR PERC CHOLECYSTOSTOMY  01/28/2022   IR PERC CHOLECYSTOSTOMY  01/31/2022   LAPAROSCOPIC HYSTERECTOMY     THYROIDECTOMY  1995   total   TOTAL KNEE ARTHROPLASTY Right 12/18/2020   Procedure: RIGHT TOTAL KNEE ARTHROPLASTY;  Surgeon: Thornton Park, MD;  Location: [facility];  Service: Orthopedics;   Laterality: Right;   TOTAL KNEE ARTHROPLASTY Left 12/10/2021   Procedure: TOTAL KNEE ARTHROPLASTY;  Surgeon: Thornton Park, MD;  Location: [facility];  Service: Orthopedics;  Laterality: Left;     MEDICATIONS:  Prior to Admission medications   Medication Sig Start Date End Date Taking? Authorizing Provider  acetaminophen (TYLENOL) 650 MG CR tablet Take 1,950 mg by mouth daily.    [provider]  apixaban (ELIQUIS) 5 MG TABS tablet Take 1 tablet (5 mg total) by mouth 2 (two) times daily. 02/25/22   Karamalegos, Devonne Doughty, DO  aspirin EC (ASPIRIN 81) 81 MG tablet Take 81 mg by mouth daily.    [provider]  bisacodyl (DULCOLAX) 5 MG EC tablet Take 2 tablets (10 mg total) by mouth daily as needed for moderate constipation. 12/11/21   Thornton Park, MD  busPIRone (BUSPAR) 5 MG tablet Take 1 tablet (5 mg total) by mouth 3 (three) times daily as needed (anxiety). 02/15/22   Karamalegos, Devonne Doughty, DO  calcium carbonate (TUMS - DOSED IN MG ELEMENTAL CALCIUM) 500 MG chewable tablet Chew 2 tablets (400 mg of elemental calcium total) by mouth 2 (two) times daily. 02/06/22   Loletha Grayer, MD  ezetimibe (ZETIA) 10 MG tablet TAKE 1 TABLET BY MOUTH DAILY 07/02/21   Minna Merritts, MD  FLUoxetine (PROZAC) 10 MG capsule TAKE 1 CAPSULE BY MOUTH DAILY 12/07/21   Karamalegos, Devonne Doughty, DO  fluticasone (FLONASE) 50 MCG/ACT nasal spray Place 2 sprays into both nostrils daily. Patient taking differently: Place 2-3 sprays into both nostrils at bedtime as needed. 02/18/21   Brunetta Jeans, PA-C  furosemide (LASIX) 20 MG tablet Take 1 tablet (20 mg total) by mouth daily as needed for edema or fluid. 03/02/22   Trudi Ida, NP  Lancets [facility] ULTRASOFT) lancets Use to check blood sugar daily 02/15/22   Olin Hauser, DO  levothyroxine (SYNTHROID) 125 MCG tablet TAKE 1 TABLET BY MOUTH  DAILY BEFORE BREAKFAST 09/29/21   Karamalegos, Devonne Doughty, DO  metoprolol succinate  (TOPROL XL) 25 MG 24 hr tablet Take 0.5 tablets (12.5 mg total) by mouth at bedtime. 03/02/22   Karamalegos, Devonne Doughty, DO  Multiple Vitamin (MULTIVITAMIN WITH MINERALS) TABS tablet Take 1 tablet by mouth daily. 02/06/22   Loletha Grayer, MD  nitroGLYCERIN (NITROSTAT) 0.4 MG SL tablet Place 1 tablet (0.4 mg total) under the tongue every 5 (five) minutes as needed for chest pain. 01/29/20   Minna Merritts, MD  ondansetron (ZOFRAN-ODT) 4 MG disintegrating tablet Take 1 tablet (4 mg total) by mouth every 8 (eight) hours as needed for nausea or vomiting. 02/10/22   Parks Ranger, Devonne Doughty, DO  ONETOUCH ULTRA test strip Use to check blood sugar daily 02/15/22   Karamalegos, Devonne Doughty, DO  polyethylene glycol (MIRALAX / GLYCOLAX) 17 g packet Take 17 g by mouth daily as needed. 02/06/22   Loletha Grayer, MD  rosuvastatin (CRESTOR) 20 MG tablet TAKE 1 TABLET BY MOUTH DAILY 07/28/21   Minna Merritts, MD  sodium bicarbonate 650 MG tablet Take 2 tablets (1,300 mg total) by  mouth daily. 02/06/22   Wieting, Richard, MD  sodium chloride flush 0.9 % SOLN injection 10 mLs by Intracatheter route as needed. Flush gallbladder drain with 5-10 ml Normal Saline once daily 02/05/22 04/02/22  Theresa Duty, NP  TRADJENTA 5 MG TABS tablet Take 1 tablet (5 mg total) by mouth daily. 03/02/22   Karamalegos, Devonne Doughty, DO  traMADol (ULTRAM) 50 MG tablet Take 50 mg by mouth every 6 (six) hours as needed. 12/23/21   [provider]     ALLERGIES:  No Known Allergies   SOCIAL HISTORY:  Social History   Socioeconomic History   Marital status: Widowed    Spouse name: Not on file   Number of children: 3   Years of education: Not on file   Highest education level: Not on file  Occupational History   Occupation: retired  Tobacco Use   Smoking status: Never   Smokeless tobacco: Never  Vaping Use   Vaping Use: Never used  Substance and Sexual Activity   Alcohol use: No   Drug use: No   Sexual  activity: Not on file  Other Topics Concern   Not on file  Social History Narrative   Not on file   Social Determinants of Health   Financial Resource Strain: Low Risk  (03/27/2021)   Overall Financial Resource Strain (CARDIA)    Difficulty of Paying Living Expenses: Not hard at all  Food Insecurity: No Food Insecurity (02/08/2022)   Hunger Vital Sign    Worried About Running Out of Food in the Last Year: Never true    [place] in the Last Year: Never true  Transportation Needs: No Transportation Needs (01/28/2022)   PRAPARE - Hydrologist (Medical): No    Lack of Transportation (Non-Medical): No  Physical Activity: Insufficiently Active (03/27/2021)   Exercise Vital Sign    Days of Exercise per Week: 3 days    Minutes of Exercise per Session: 30 min  Stress: No Stress Concern Present (03/27/2021)   [place]    Feeling of Stress : Only a little  Social Connections: Moderately Integrated (03/27/2021)   Social Connection and Isolation Panel [NHANES]    Frequency of Communication with Friends and Family: More than three times a week    Frequency of Social Gatherings with Friends and Family: More than three times a week    Attends Religious Services: More than 4 times per year    Active Member of [employer] or Organizations: Yes    Attends Archivist Meetings: More than 4 times per year    Marital Status: Widowed  Intimate Partner Violence: Not At Risk (01/28/2022)   Humiliation, Afraid, Rape, and Kick questionnaire    Fear of Current or Ex-Partner: No    Emotionally Abused: No    Physically Abused: No    Sexually Abused: No     FAMILY HISTORY:  Family History  Problem Relation Age of Onset   Heart attack Father    Heart failure Father    Hypertension Father    Hyperlipidemia Father    Heart disease Father 26       CABG    Pulmonary fibrosis Son    Breast cancer Neg  Hx       REVIEW OF SYSTEMS:  Review of Systems  Constitutional:  Negative for chills and fever.  Respiratory:  Negative for cough and shortness of breath.  Cardiovascular:  Negative for chest pain and palpitations.  Gastrointestinal:  Negative for abdominal pain, nausea and vomiting.       + cholecystostomy   Genitourinary:  Negative for dysuria and urgency.  All other systems reviewed and are negative.   VITAL SIGNS:  Temp:  [98.3 F (36.8 C)-100.6 F (38.1 C)] 99.5 F (37.5 C) (01/09 0834) Pulse Rate:  [81-104] 82 (01/09 1100) Resp:  [16-20] 16 (01/09 1100) BP: (107-148)/(37-76) 116/38 (01/09 1100) SpO2:  [93 %-100 %] 100 % (01/09 1100) Weight:  [104.8 kg] 104.8 kg (01/08 2239)     Height: '5\' 6"'$  (167.6 cm) Weight: 104.8 kg BMI (Calculated): 37.3   INTAKE/OUTPUT:  No intake/output data recorded.  PHYSICAL EXAM:  Physical Exam Vitals and nursing note reviewed. Exam conducted with a chaperone present.  Constitutional:      General: She is not in acute distress.    Appearance: She is well-developed. She is obese. She is not ill-appearing.     Comments: Patient resting comfortably; NAD  HENT:     Head: Normocephalic and atraumatic.  Eyes:     General: No scleral icterus.    Extraocular Movements: Extraocular movements intact.  Cardiovascular:     Rate and Rhythm: Normal rate.     Heart sounds: Normal heart sounds. No murmur heard. Pulmonary:     Effort: Pulmonary effort is normal. No respiratory distress.     Breath sounds: Normal breath sounds.  Abdominal:     General: There is no distension.     Palpations: Abdomen is soft.     Tenderness: There is no abdominal tenderness. There is no guarding or rebound.       Comments: Abdomen is soft, non-tender, non-distended, no rebound/guarding. Cholecystostomy in the RUQ; skin is slightly macerated. There is a scant amount of fibrinous drainage around the tube, no gross purulence, no gross abscess. Drain now connected to JP  bulb.   Genitourinary:    Comments: Deferred Skin:    General: Skin is warm and dry.     Coloration: Skin is not jaundiced or pale.  Neurological:     General: No focal deficit present.     Mental Status: She is alert and oriented to person, place, and time.  Psychiatric:        Mood and Affect: Mood normal.        Behavior: Behavior normal.    Cholecystostomy Tube (03/09/2021):     Labs:     Latest Ref Rng & Units 03/08/2022   10:48 PM 02/06/2022    5:26 AM 02/05/2022    5:42 AM  CBC  WBC 4.0 - 10.5 K/uL 13.8  18.4  14.5   Hemoglobin 12.0 - 15.0 g/dL 9.9  8.7  8.5   Hematocrit 36.0 - 46.0 % 33.0  26.2  26.0   Platelets 150 - 400 K/uL 386  303  274       Latest Ref Rng & Units 03/08/2022   10:48 PM 02/06/2022    5:26 AM 02/05/2022    5:42 AM  CMP  Glucose 70 - 99 mg/dL 279  157  231   BUN 8 - 23 mg/dL 12  39  50   Creatinine 0.44 - 1.00 mg/dL 0.80  2.13  2.99   Sodium 135 - 145 mmol/L 132  136  130   Potassium 3.5 - 5.1 mmol/L 3.9  3.5  3.2   Chloride 98 - 111 mmol/L 98  113  108   CO2 22 -  32 mmol/L '25  20  17   '$ Calcium 8.9 - 10.3 mg/dL 8.0  7.4  7.0   Total Protein 6.5 - 8.1 g/dL 7.6     Total Bilirubin 0.3 - 1.2 mg/dL 0.7     Alkaline Phos 38 - 126 U/L 127     AST 15 - 41 U/L 40     ALT 0 - 44 U/L 30        Imaging studies:   CT Abdomen/Pelvis (03/09/2021) personally reviewed which shows adequately positioned cholecystostomy tube, no gross abscess around the tube, and radiologist report reviewed below:  IMPRESSION: Cholecystostomy tube with pigtail portion in the gallbladder fundus. The gallbladder is distended with large stone in the gallbladder neck. There is wall thickening and pericholecystic fluid slightly increased from prior. Clinical correlation recommended to ensure the drain is properly functioning.   Peripherally enhancing mildly thick-walled 7.3 cm fluid collection in the anterior right pelvis concerning for abscess. This is below the inferior  aspect of the scan on 01/30/2022.   Large colonic stool burden.  Correlate for constipation.   Redemonstrated chronic left portal vein thrombus.   Aortic Atherosclerosis (ICD10-I70.0).   Body wall anasarca.   Assessment/Plan 77 y.o. female with drainage around cholecystostomy tube, otherwise doing well, complicated by need for Eliquis   - I believe that the drainage around her tubing is more fibrinous in nature, does not appear grossly purulent. There is no evidence of abscess around the drain nor the gallbladder on imaging. Drain is adequately positioned - I do not think there is any emergent indication for surgical intervention in this setting especially given her need anticoagulation. She plans to follow up outpatient with [facility] as scheduled  - Reasonable to do 7 day course of Augmentin as a precaution  - Continue percutaneous cholecystostomy drain; monitor output; now with JP bulb  - We will be available if needed  All of the above findings and recommendations were discussed with the patient and her family, and all of patient's and her family's questions were answered to their expressed satisfaction.  Thank you for the opportunity to participate in this patient's care.   -- Edison Simon, PA-C [facility] 03/09/2022, 11:43 AM M-F: 7am - 4pm

## 2022-03-09 NOTE — ED Provider Notes (Signed)
Signout received by overnight provider.  Contacted Dr. Narda Bonds at [facility], no operation to be performed today given comorbidities and anticoagulated status.  No bed availability at [facility].  Recommendation to contact IR today at [facility].  I consulted with interventional radiology who evaluated the drain, placed to suction instead of gravity, and note that the drain is now completely functional.  I also consulted with general surgery here at [facility] who evaluated the patient and agree that discharge is appropriate at this time with follow-up with Dr. Narda Bonds at [facility] for cholecystectomy at a later date, now drain is functional, patient is stable, and plan for antibiotics Augmentin for 7 days as suggested by surgery consultation.  I spoke with the patient and her son and they are in agreement with this plan.  They understand to return to the emergency department with any progression of symptoms or infectious symptoms.     Lucillie Garfinkel, MD 03/09/22 [phone]

## 2022-03-10 DIAGNOSIS — D696 Thrombocytopenia, unspecified: Secondary | ICD-10-CM | POA: Diagnosis not present

## 2022-03-10 DIAGNOSIS — G4733 Obstructive sleep apnea (adult) (pediatric): Secondary | ICD-10-CM | POA: Diagnosis not present

## 2022-03-10 DIAGNOSIS — K819 Cholecystitis, unspecified: Secondary | ICD-10-CM | POA: Diagnosis not present

## 2022-03-10 DIAGNOSIS — N1831 Chronic kidney disease, stage 3a: Secondary | ICD-10-CM | POA: Diagnosis not present

## 2022-03-10 DIAGNOSIS — Z792 Long term (current) use of antibiotics: Secondary | ICD-10-CM | POA: Diagnosis not present

## 2022-03-10 DIAGNOSIS — I2510 Atherosclerotic heart disease of native coronary artery without angina pectoris: Secondary | ICD-10-CM | POA: Diagnosis not present

## 2022-03-10 DIAGNOSIS — N179 Acute kidney failure, unspecified: Secondary | ICD-10-CM | POA: Diagnosis not present

## 2022-03-10 DIAGNOSIS — Z7982 Long term (current) use of aspirin: Secondary | ICD-10-CM | POA: Diagnosis not present

## 2022-03-10 DIAGNOSIS — I5032 Chronic diastolic (congestive) heart failure: Secondary | ICD-10-CM | POA: Diagnosis not present

## 2022-03-10 DIAGNOSIS — I214 Non-ST elevation (NSTEMI) myocardial infarction: Secondary | ICD-10-CM | POA: Diagnosis not present

## 2022-03-10 DIAGNOSIS — E1140 Type 2 diabetes mellitus with diabetic neuropathy, unspecified: Secondary | ICD-10-CM | POA: Diagnosis not present

## 2022-03-10 DIAGNOSIS — I130 Hypertensive heart and chronic kidney disease with heart failure and stage 1 through stage 4 chronic kidney disease, or unspecified chronic kidney disease: Secondary | ICD-10-CM | POA: Diagnosis not present

## 2022-03-10 DIAGNOSIS — D509 Iron deficiency anemia, unspecified: Secondary | ICD-10-CM | POA: Diagnosis not present

## 2022-03-10 DIAGNOSIS — U071 COVID-19: Secondary | ICD-10-CM | POA: Diagnosis not present

## 2022-03-10 DIAGNOSIS — Z7984 Long term (current) use of oral hypoglycemic drugs: Secondary | ICD-10-CM | POA: Diagnosis not present

## 2022-03-10 DIAGNOSIS — E871 Hypo-osmolality and hyponatremia: Secondary | ICD-10-CM | POA: Diagnosis not present

## 2022-03-10 DIAGNOSIS — E039 Hypothyroidism, unspecified: Secondary | ICD-10-CM | POA: Diagnosis not present

## 2022-03-10 DIAGNOSIS — E1122 Type 2 diabetes mellitus with diabetic chronic kidney disease: Secondary | ICD-10-CM | POA: Diagnosis not present

## 2022-03-10 DIAGNOSIS — E1169 Type 2 diabetes mellitus with other specified complication: Secondary | ICD-10-CM | POA: Diagnosis not present

## 2022-03-10 DIAGNOSIS — Z7901 Long term (current) use of anticoagulants: Secondary | ICD-10-CM | POA: Diagnosis not present

## 2022-03-10 DIAGNOSIS — A419 Sepsis, unspecified organism: Secondary | ICD-10-CM | POA: Diagnosis not present

## 2022-03-10 NOTE — Telephone Encounter (Signed)
Scarlett from [employer]. She is stating she is from [facility] and they had placed an order on Dec 16th for OT eval and then followed up requesting the actual order request. She states they did not ever get further communication from office. She is reaching out to office and due to expiration of order and now is submitting an order again for OT   Requesting OT for 1 wk 2, then every 2 wks for 2, and an additional 1 wk 5.  She wants the PCP to call [facility] at [phone] to give order and not leave message but ask for [facility]. States it has to be done this way as time expired on order for their system. Again Call [facility] at [phone] and they will handle from there.

## 2022-03-11 ENCOUNTER — Other Ambulatory Visit: Payer: Self-pay | Admitting: Cardiovascular Disease

## 2022-03-11 ENCOUNTER — Other Ambulatory Visit: Payer: Self-pay | Admitting: Family Medicine

## 2022-03-11 DIAGNOSIS — E1121 Type 2 diabetes mellitus with diabetic nephropathy: Secondary | ICD-10-CM

## 2022-03-11 LAB — URINE CULTURE: Culture: >=100000 — AB

## 2022-03-12 NOTE — Telephone Encounter (Signed)
Unable to refill per protocol, Rx expired. Medication was discontinued 02/06/22. Will refuse.  Requested Prescriptions  Pending Prescriptions Disp Refills   metFORMIN (GLUCOPHAGE-XR) 500 MG 24 hr tablet [Pharmacy Med Name: metFORMIN HCl ER 500 MG Oral Tablet Extended Release 24 Hour] 270 tablet 3    Sig: TAKE 3 TABLETS BY MOUTH DAILY  WITH BREAKFAST     Endocrinology:  Diabetes - Biguanides Failed - 03/11/2022 11:08 PM      Failed - B12 Level in normal range and within 720 days    No results found for: "VITAMINB12"       Failed - CBC within normal limits and completed in the last 12 months    WBC  Date Value Ref Range Status  03/08/2022 13.8 (H) 4.0 - 10.5 K/uL Final   RBC  Date Value Ref Range Status  03/08/2022 3.43 (L) 3.87 - 5.11 MIL/uL Final   Hemoglobin  Date Value Ref Range Status  03/08/2022 9.9 (L) 12.0 - 15.0 g/dL Final   HGB  Date Value Ref Range Status  02/14/2013 11.6 (L) 12.0 - 16.0 g/dL Final   HCT  Date Value Ref Range Status  03/08/2022 33.0 (L) 36.0 - 46.0 % Final  02/06/2013 35.9 35.0 - 47.0 % Final   MCHC  Date Value Ref Range Status  03/08/2022 30.0 30.0 - 36.0 g/dL Final   [facility]  Date Value Ref Range Status  03/08/2022 28.9 26.0 - 34.0 pg Final   MCV  Date Value Ref Range Status  03/08/2022 96.2 80.0 - 100.0 fL Final  02/06/2013 91 80 - 100 fL Final   No results found for: "PLTCOUNTKUC", "LABPLAT", "POCPLA" RDW  Date Value Ref Range Status  03/08/2022 15.1 11.5 - 15.5 % Final  02/06/2013 12.7 11.5 - 14.5 % Final         Passed - Cr in normal range and within 360 days    Creat  Date Value Ref Range Status  06/24/2020 0.96 (H) 0.60 - 0.93 mg/dL Final    Comment:    For patients >44 years of age, the reference limit for Creatinine is approximately 13% higher for people identified as African-American. .    Creatinine, Ser  Date Value Ref Range Status  03/08/2022 0.80 0.44 - 1.00 mg/dL Final         Passed - HBA1C is between 0 and  7.9 and within 180 days    Hemoglobin A1C  Date Value Ref Range Status  08/10/2018 7.7  Final   Hgb A1c MFr Bld  Date Value Ref Range Status  01/27/2022 6.8 (H) 4.8 - 5.6 % Final    Comment:    (NOTE)         Prediabetes: 5.7 - 6.4         Diabetes: >6.4         Glycemic control for adults with diabetes: <7.0          Passed - eGFR in normal range and within 360 days    GFR, Est African American  Date Value Ref Range Status  06/24/2020 67 > OR = 60 mL/min/1.103m Final   GFR, Est Non African American  Date Value Ref Range Status  06/24/2020 58 (L) > OR = 60 mL/min/1.770mFinal   GFR, Estimated  Date Value Ref Range Status  03/08/2022 >60 >60 mL/min Final    Comment:    (NOTE) Calculated using the CKD-EPI Creatinine Equation (2021)          Passed -  Valid encounter within last 6 months    Recent Outpatient Visits           1 month ago Acute cholecystitis   [place], DO   9 months ago Controlled type 2 diabetes mellitus with diabetic nephropathy, without long-term current use of insulin ([place])   [facility], Devonne Doughty, DO   1 year ago Controlled type 2 diabetes mellitus with diabetic nephropathy, without long-term current use of insulin [facility])   [place], DO   2 years ago Annual physical exam   [facility] Olin Hauser, DO   2 years ago Controlled type 2 diabetes mellitus with diabetic nephropathy, without long-term current use of insulin ([place])   [facility] Olin Hauser, DO       Future Appointments             In 2 months Gollan, Kathlene November, MD [place]

## 2022-03-13 ENCOUNTER — Other Ambulatory Visit: Payer: Self-pay | Admitting: Family Medicine

## 2022-03-13 DIAGNOSIS — E1121 Type 2 diabetes mellitus with diabetic nephropathy: Secondary | ICD-10-CM

## 2022-03-13 LAB — CULTURE, BLOOD (ROUTINE X 2)
Culture: NO GROWTH
Culture: NO GROWTH

## 2022-03-15 NOTE — Telephone Encounter (Signed)
Requested medication (s) are due for refill today: see below   Requested medication (s) are on the active medication list: no  Last refill:    Future visit scheduled: no  Notes to clinic:  discontinued on 02/06/2022 by Loletha Grayer, MD for the following reason: Stop Taking at Discharge. Please advise if appropriate for refill.      Requested Prescriptions  Pending Prescriptions Disp Refills   metFORMIN (GLUCOPHAGE-XR) 500 MG 24 hr tablet [Pharmacy Med Name: metFORMIN HCl ER 500 MG Oral Tablet Extended Release 24 Hour] 270 tablet 3    Sig: TAKE 3 TABLETS BY MOUTH DAILY  WITH BREAKFAST     Endocrinology:  Diabetes - Biguanides Failed - 03/13/2022 10:56 PM      Failed - B12 Level in normal range and within 720 days    No results found for: "VITAMINB12"       Failed - CBC within normal limits and completed in the last 12 months    WBC  Date Value Ref Range Status  03/08/2022 13.8 (H) 4.0 - 10.5 K/uL Final   RBC  Date Value Ref Range Status  03/08/2022 3.43 (L) 3.87 - 5.11 MIL/uL Final   Hemoglobin  Date Value Ref Range Status  03/08/2022 9.9 (L) 12.0 - 15.0 g/dL Final   HGB  Date Value Ref Range Status  02/14/2013 11.6 (L) 12.0 - 16.0 g/dL Final   HCT  Date Value Ref Range Status  03/08/2022 33.0 (L) 36.0 - 46.0 % Final  02/06/2013 35.9 35.0 - 47.0 % Final   MCHC  Date Value Ref Range Status  03/08/2022 30.0 30.0 - 36.0 g/dL Final   [facility]  Date Value Ref Range Status  03/08/2022 28.9 26.0 - 34.0 pg Final   MCV  Date Value Ref Range Status  03/08/2022 96.2 80.0 - 100.0 fL Final  02/06/2013 91 80 - 100 fL Final   No results found for: "PLTCOUNTKUC", "LABPLAT", "POCPLA" RDW  Date Value Ref Range Status  03/08/2022 15.1 11.5 - 15.5 % Final  02/06/2013 12.7 11.5 - 14.5 % Final         Passed - Cr in normal range and within 360 days    Creat  Date Value Ref Range Status  06/24/2020 0.96 (H) 0.60 - 0.93 mg/dL Final    Comment:    For patients >49 years of  age, the reference limit for Creatinine is approximately 13% higher for people identified as African-American. .    Creatinine, Ser  Date Value Ref Range Status  03/08/2022 0.80 0.44 - 1.00 mg/dL Final         Passed - HBA1C is between 0 and 7.9 and within 180 days    Hemoglobin A1C  Date Value Ref Range Status  08/10/2018 7.7  Final   Hgb A1c MFr Bld  Date Value Ref Range Status  01/27/2022 6.8 (H) 4.8 - 5.6 % Final    Comment:    (NOTE)         Prediabetes: 5.7 - 6.4         Diabetes: >6.4         Glycemic control for adults with diabetes: <7.0          Passed - eGFR in normal range and within 360 days    GFR, Est African American  Date Value Ref Range Status  06/24/2020 67 > OR = 60 mL/min/1.51m Final   GFR, Est Non African American  Date Value Ref Range Status  06/24/2020 58 (  L) > OR = 60 mL/min/1.38m Final   GFR, Estimated  Date Value Ref Range Status  03/08/2022 >60 >60 mL/min Final    Comment:    (NOTE) Calculated using the CKD-EPI Creatinine Equation (2021)          Passed - Valid encounter within last 6 months    Recent Outpatient Visits           1 month ago [facility]   9 months ago Controlled type 2 diabetes mellitus with diabetic nephropathy, without long-term current use of insulin ([place]   [facility]   1 year ago Controlled type 2 diabetes mellitus with diabetic nephropathy, without long-term current use of insulin ([place]   [place]   2 years ago Annual physical exam   [facility]   2 years ago Controlled type 2 diabetes mellitus with diabetic nephropathy, without long-term current use of insulin ([place]   [facility]       Future Appointments             In 2 months Gollan, TKathlene November MD  [place]

## 2022-03-17 DIAGNOSIS — I255 Ischemic cardiomyopathy: Secondary | ICD-10-CM | POA: Diagnosis not present

## 2022-03-17 DIAGNOSIS — E890 Postprocedural hypothyroidism: Secondary | ICD-10-CM | POA: Diagnosis not present

## 2022-03-17 DIAGNOSIS — G4730 Sleep apnea, unspecified: Secondary | ICD-10-CM | POA: Diagnosis not present

## 2022-03-17 DIAGNOSIS — I10 Essential (primary) hypertension: Secondary | ICD-10-CM | POA: Diagnosis not present

## 2022-03-17 DIAGNOSIS — K8080 Other cholelithiasis without obstruction: Secondary | ICD-10-CM | POA: Diagnosis not present

## 2022-03-17 DIAGNOSIS — E1121 Type 2 diabetes mellitus with diabetic nephropathy: Secondary | ICD-10-CM | POA: Diagnosis not present

## 2022-03-17 DIAGNOSIS — I8290 Acute embolism and thrombosis of unspecified vein: Secondary | ICD-10-CM | POA: Diagnosis not present

## 2022-03-17 DIAGNOSIS — I2510 Atherosclerotic heart disease of native coronary artery without angina pectoris: Secondary | ICD-10-CM | POA: Diagnosis not present

## 2022-03-18 DIAGNOSIS — R600 Localized edema: Secondary | ICD-10-CM | POA: Diagnosis not present

## 2022-03-18 DIAGNOSIS — N179 Acute kidney failure, unspecified: Secondary | ICD-10-CM | POA: Diagnosis not present

## 2022-03-18 DIAGNOSIS — E871 Hypo-osmolality and hyponatremia: Secondary | ICD-10-CM | POA: Diagnosis not present

## 2022-03-18 DIAGNOSIS — I10 Essential (primary) hypertension: Secondary | ICD-10-CM | POA: Diagnosis not present

## 2022-03-24 ENCOUNTER — Other Ambulatory Visit: Payer: Self-pay | Admitting: Cardiology

## 2022-03-24 DIAGNOSIS — R600 Localized edema: Secondary | ICD-10-CM

## 2022-03-26 DIAGNOSIS — K810 Acute cholecystitis: Secondary | ICD-10-CM | POA: Diagnosis not present

## 2022-03-26 DIAGNOSIS — K5730 Diverticulosis of large intestine without perforation or abscess without bleeding: Secondary | ICD-10-CM | POA: Diagnosis not present

## 2022-03-26 DIAGNOSIS — K828 Other specified diseases of gallbladder: Secondary | ICD-10-CM | POA: Diagnosis not present

## 2022-03-26 DIAGNOSIS — I2510 Atherosclerotic heart disease of native coronary artery without angina pectoris: Secondary | ICD-10-CM | POA: Diagnosis not present

## 2022-03-26 DIAGNOSIS — I255 Ischemic cardiomyopathy: Secondary | ICD-10-CM | POA: Diagnosis not present

## 2022-03-26 DIAGNOSIS — Z7982 Long term (current) use of aspirin: Secondary | ICD-10-CM | POA: Diagnosis not present

## 2022-03-26 DIAGNOSIS — K8000 Calculus of gallbladder with acute cholecystitis without obstruction: Secondary | ICD-10-CM | POA: Diagnosis not present

## 2022-03-26 DIAGNOSIS — E119 Type 2 diabetes mellitus without complications: Secondary | ICD-10-CM | POA: Diagnosis not present

## 2022-03-26 DIAGNOSIS — J90 Pleural effusion, not elsewhere classified: Secondary | ICD-10-CM | POA: Diagnosis not present

## 2022-03-26 DIAGNOSIS — Z7984 Long term (current) use of oral hypoglycemic drugs: Secondary | ICD-10-CM | POA: Diagnosis not present

## 2022-03-26 DIAGNOSIS — M47816 Spondylosis without myelopathy or radiculopathy, lumbar region: Secondary | ICD-10-CM | POA: Diagnosis not present

## 2022-03-26 DIAGNOSIS — K8010 Calculus of gallbladder with chronic cholecystitis without obstruction: Secondary | ICD-10-CM | POA: Diagnosis not present

## 2022-03-26 DIAGNOSIS — Z7901 Long term (current) use of anticoagulants: Secondary | ICD-10-CM | POA: Diagnosis not present

## 2022-03-26 DIAGNOSIS — K660 Peritoneal adhesions (postprocedural) (postinfection): Secondary | ICD-10-CM | POA: Diagnosis not present

## 2022-03-26 DIAGNOSIS — Z79899 Other long term (current) drug therapy: Secondary | ICD-10-CM | POA: Diagnosis not present

## 2022-03-26 NOTE — Telephone Encounter (Signed)
Please advise on directions for furosemide refill. Per Anderson [facility] 03/02/22 office note: continue lasix 20 mg twice/day per nephrology recommendations  Rx was not changed on AVS and it still listed as once daily PRN. Thank you!

## 2022-03-26 NOTE — Telephone Encounter (Signed)
Reviewed the patient's chart. She was seen by nephrology on 03/18/22 and per Dr. Keturah Barre office note: Change Lasix to 20 mg daily as needed. No need to take daily.   I think nephrology should manage her lasix refill as they are changing her doses.

## 2022-03-28 ENCOUNTER — Encounter: Payer: Self-pay | Admitting: Family Medicine

## 2022-04-06 ENCOUNTER — Ambulatory Visit: Payer: Medicare Other | Admitting: Radiology

## 2022-04-14 DIAGNOSIS — R509 Fever, unspecified: Secondary | ICD-10-CM | POA: Diagnosis not present

## 2022-04-15 DIAGNOSIS — Z9049 Acquired absence of other specified parts of digestive tract: Secondary | ICD-10-CM | POA: Diagnosis not present

## 2022-04-15 DIAGNOSIS — R932 Abnormal findings on diagnostic imaging of liver and biliary tract: Secondary | ICD-10-CM | POA: Diagnosis not present

## 2022-04-15 DIAGNOSIS — R509 Fever, unspecified: Secondary | ICD-10-CM | POA: Diagnosis not present

## 2022-04-16 ENCOUNTER — Encounter: Payer: Self-pay | Admitting: Family Medicine

## 2022-04-19 DIAGNOSIS — Z7984 Long term (current) use of oral hypoglycemic drugs: Secondary | ICD-10-CM | POA: Diagnosis not present

## 2022-04-19 DIAGNOSIS — R935 Abnormal findings on diagnostic imaging of other abdominal regions, including retroperitoneum: Secondary | ICD-10-CM | POA: Diagnosis not present

## 2022-04-19 DIAGNOSIS — I255 Ischemic cardiomyopathy: Secondary | ICD-10-CM | POA: Diagnosis not present

## 2022-04-19 DIAGNOSIS — I252 Old myocardial infarction: Secondary | ICD-10-CM | POA: Diagnosis not present

## 2022-04-19 DIAGNOSIS — E1165 Type 2 diabetes mellitus with hyperglycemia: Secondary | ICD-10-CM | POA: Diagnosis not present

## 2022-04-19 DIAGNOSIS — E7849 Other hyperlipidemia: Secondary | ICD-10-CM | POA: Diagnosis not present

## 2022-04-19 DIAGNOSIS — E785 Hyperlipidemia, unspecified: Secondary | ICD-10-CM | POA: Diagnosis not present

## 2022-04-19 DIAGNOSIS — Z9049 Acquired absence of other specified parts of digestive tract: Secondary | ICD-10-CM | POA: Diagnosis not present

## 2022-04-19 DIAGNOSIS — T8143XA Infection following a procedure, organ and space surgical site, initial encounter: Secondary | ICD-10-CM | POA: Diagnosis not present

## 2022-04-19 DIAGNOSIS — K750 Abscess of liver: Secondary | ICD-10-CM | POA: Diagnosis not present

## 2022-04-19 DIAGNOSIS — T8142XS Infection following a procedure, deep incisional surgical site, sequela: Secondary | ICD-10-CM | POA: Diagnosis not present

## 2022-04-19 DIAGNOSIS — E1121 Type 2 diabetes mellitus with diabetic nephropathy: Secondary | ICD-10-CM | POA: Diagnosis not present

## 2022-04-19 DIAGNOSIS — Z9889 Other specified postprocedural states: Secondary | ICD-10-CM | POA: Diagnosis not present

## 2022-04-19 DIAGNOSIS — Z9189 Other specified personal risk factors, not elsewhere classified: Secondary | ICD-10-CM | POA: Diagnosis not present

## 2022-04-19 DIAGNOSIS — Z792 Long term (current) use of antibiotics: Secondary | ICD-10-CM | POA: Diagnosis not present

## 2022-04-19 DIAGNOSIS — I2510 Atherosclerotic heart disease of native coronary artery without angina pectoris: Secondary | ICD-10-CM | POA: Diagnosis not present

## 2022-04-19 DIAGNOSIS — Z955 Presence of coronary angioplasty implant and graft: Secondary | ICD-10-CM | POA: Diagnosis not present

## 2022-04-19 DIAGNOSIS — A498 Other bacterial infections of unspecified site: Secondary | ICD-10-CM | POA: Diagnosis not present

## 2022-04-19 DIAGNOSIS — I82409 Acute embolism and thrombosis of unspecified deep veins of unspecified lower extremity: Secondary | ICD-10-CM | POA: Diagnosis not present

## 2022-04-19 DIAGNOSIS — R5383 Other fatigue: Secondary | ICD-10-CM | POA: Diagnosis not present

## 2022-04-19 DIAGNOSIS — A491 Streptococcal infection, unspecified site: Secondary | ICD-10-CM | POA: Diagnosis not present

## 2022-04-19 DIAGNOSIS — T8141XA Infection following a procedure, superficial incisional surgical site, initial encounter: Secondary | ICD-10-CM | POA: Diagnosis not present

## 2022-04-19 DIAGNOSIS — E119 Type 2 diabetes mellitus without complications: Secondary | ICD-10-CM | POA: Diagnosis not present

## 2022-04-19 DIAGNOSIS — I82442 Acute embolism and thrombosis of left tibial vein: Secondary | ICD-10-CM | POA: Diagnosis not present

## 2022-04-19 DIAGNOSIS — K651 Peritoneal abscess: Secondary | ICD-10-CM | POA: Diagnosis not present

## 2022-04-19 DIAGNOSIS — L02211 Cutaneous abscess of abdominal wall: Secondary | ICD-10-CM | POA: Diagnosis not present

## 2022-04-19 DIAGNOSIS — Z7901 Long term (current) use of anticoagulants: Secondary | ICD-10-CM | POA: Diagnosis not present

## 2022-04-19 DIAGNOSIS — R509 Fever, unspecified: Secondary | ICD-10-CM | POA: Diagnosis not present

## 2022-04-19 DIAGNOSIS — K7689 Other specified diseases of liver: Secondary | ICD-10-CM | POA: Diagnosis not present

## 2022-04-19 DIAGNOSIS — Z7982 Long term (current) use of aspirin: Secondary | ICD-10-CM | POA: Diagnosis not present

## 2022-04-19 DIAGNOSIS — K810 Acute cholecystitis: Secondary | ICD-10-CM | POA: Diagnosis not present

## 2022-04-19 DIAGNOSIS — I82402 Acute embolism and thrombosis of unspecified deep veins of left lower extremity: Secondary | ICD-10-CM | POA: Diagnosis not present

## 2022-04-19 DIAGNOSIS — R932 Abnormal findings on diagnostic imaging of liver and biliary tract: Secondary | ICD-10-CM | POA: Diagnosis not present

## 2022-04-19 DIAGNOSIS — E890 Postprocedural hypothyroidism: Secondary | ICD-10-CM | POA: Diagnosis not present

## 2022-04-19 DIAGNOSIS — Z8619 Personal history of other infectious and parasitic diseases: Secondary | ICD-10-CM | POA: Diagnosis not present

## 2022-04-19 DIAGNOSIS — Z96653 Presence of artificial knee joint, bilateral: Secondary | ICD-10-CM | POA: Diagnosis not present

## 2022-04-19 DIAGNOSIS — E8809 Other disorders of plasma-protein metabolism, not elsewhere classified: Secondary | ICD-10-CM | POA: Diagnosis not present

## 2022-04-19 DIAGNOSIS — I10 Essential (primary) hypertension: Secondary | ICD-10-CM | POA: Diagnosis not present

## 2022-04-20 DIAGNOSIS — I10 Essential (primary) hypertension: Secondary | ICD-10-CM | POA: Diagnosis not present

## 2022-04-20 DIAGNOSIS — K750 Abscess of liver: Secondary | ICD-10-CM | POA: Diagnosis not present

## 2022-04-20 DIAGNOSIS — E785 Hyperlipidemia, unspecified: Secondary | ICD-10-CM | POA: Diagnosis not present

## 2022-04-20 DIAGNOSIS — I82409 Acute embolism and thrombosis of unspecified deep veins of unspecified lower extremity: Secondary | ICD-10-CM | POA: Diagnosis not present

## 2022-04-20 DIAGNOSIS — E119 Type 2 diabetes mellitus without complications: Secondary | ICD-10-CM | POA: Diagnosis not present

## 2022-04-21 DIAGNOSIS — K750 Abscess of liver: Secondary | ICD-10-CM | POA: Diagnosis not present

## 2022-04-21 DIAGNOSIS — I82409 Acute embolism and thrombosis of unspecified deep veins of unspecified lower extremity: Secondary | ICD-10-CM | POA: Diagnosis not present

## 2022-04-21 DIAGNOSIS — E119 Type 2 diabetes mellitus without complications: Secondary | ICD-10-CM | POA: Diagnosis not present

## 2022-04-21 DIAGNOSIS — I10 Essential (primary) hypertension: Secondary | ICD-10-CM | POA: Diagnosis not present

## 2022-04-21 DIAGNOSIS — E785 Hyperlipidemia, unspecified: Secondary | ICD-10-CM | POA: Diagnosis not present

## 2022-04-22 DIAGNOSIS — K7689 Other specified diseases of liver: Secondary | ICD-10-CM | POA: Diagnosis not present

## 2022-04-22 DIAGNOSIS — E785 Hyperlipidemia, unspecified: Secondary | ICD-10-CM | POA: Diagnosis not present

## 2022-04-22 DIAGNOSIS — R509 Fever, unspecified: Secondary | ICD-10-CM | POA: Diagnosis not present

## 2022-04-22 DIAGNOSIS — Z9889 Other specified postprocedural states: Secondary | ICD-10-CM | POA: Diagnosis not present

## 2022-04-22 DIAGNOSIS — I10 Essential (primary) hypertension: Secondary | ICD-10-CM | POA: Diagnosis not present

## 2022-04-22 DIAGNOSIS — I82409 Acute embolism and thrombosis of unspecified deep veins of unspecified lower extremity: Secondary | ICD-10-CM | POA: Diagnosis not present

## 2022-04-22 DIAGNOSIS — K750 Abscess of liver: Secondary | ICD-10-CM | POA: Diagnosis not present

## 2022-04-23 DIAGNOSIS — K750 Abscess of liver: Secondary | ICD-10-CM | POA: Diagnosis not present

## 2022-04-23 DIAGNOSIS — R932 Abnormal findings on diagnostic imaging of liver and biliary tract: Secondary | ICD-10-CM | POA: Diagnosis not present

## 2022-04-23 DIAGNOSIS — Z9049 Acquired absence of other specified parts of digestive tract: Secondary | ICD-10-CM | POA: Diagnosis not present

## 2022-04-24 DIAGNOSIS — K750 Abscess of liver: Secondary | ICD-10-CM | POA: Diagnosis not present

## 2022-04-25 DIAGNOSIS — K651 Peritoneal abscess: Secondary | ICD-10-CM | POA: Diagnosis not present

## 2022-04-25 DIAGNOSIS — K750 Abscess of liver: Secondary | ICD-10-CM | POA: Diagnosis not present

## 2022-04-25 DIAGNOSIS — I10 Essential (primary) hypertension: Secondary | ICD-10-CM | POA: Diagnosis not present

## 2022-04-25 DIAGNOSIS — E119 Type 2 diabetes mellitus without complications: Secondary | ICD-10-CM | POA: Diagnosis not present

## 2022-04-25 LAB — HEMOGLOBIN A1C: Hemoglobin A1C: 7.9

## 2022-04-26 DIAGNOSIS — I82402 Acute embolism and thrombosis of unspecified deep veins of left lower extremity: Secondary | ICD-10-CM | POA: Diagnosis not present

## 2022-04-26 DIAGNOSIS — E119 Type 2 diabetes mellitus without complications: Secondary | ICD-10-CM | POA: Diagnosis not present

## 2022-04-26 DIAGNOSIS — K750 Abscess of liver: Secondary | ICD-10-CM | POA: Diagnosis not present

## 2022-04-26 DIAGNOSIS — K651 Peritoneal abscess: Secondary | ICD-10-CM | POA: Diagnosis not present

## 2022-04-26 DIAGNOSIS — I10 Essential (primary) hypertension: Secondary | ICD-10-CM | POA: Diagnosis not present

## 2022-04-27 DIAGNOSIS — E119 Type 2 diabetes mellitus without complications: Secondary | ICD-10-CM | POA: Diagnosis not present

## 2022-04-27 DIAGNOSIS — K810 Acute cholecystitis: Secondary | ICD-10-CM | POA: Diagnosis not present

## 2022-04-27 DIAGNOSIS — I82402 Acute embolism and thrombosis of unspecified deep veins of left lower extremity: Secondary | ICD-10-CM | POA: Diagnosis not present

## 2022-04-27 DIAGNOSIS — I10 Essential (primary) hypertension: Secondary | ICD-10-CM | POA: Diagnosis not present

## 2022-04-27 DIAGNOSIS — K750 Abscess of liver: Secondary | ICD-10-CM | POA: Diagnosis not present

## 2022-04-27 DIAGNOSIS — K651 Peritoneal abscess: Secondary | ICD-10-CM | POA: Diagnosis not present

## 2022-04-27 DIAGNOSIS — I2510 Atherosclerotic heart disease of native coronary artery without angina pectoris: Secondary | ICD-10-CM | POA: Diagnosis not present

## 2022-04-28 DIAGNOSIS — K750 Abscess of liver: Secondary | ICD-10-CM | POA: Diagnosis not present

## 2022-04-28 DIAGNOSIS — E119 Type 2 diabetes mellitus without complications: Secondary | ICD-10-CM | POA: Diagnosis not present

## 2022-04-28 DIAGNOSIS — I10 Essential (primary) hypertension: Secondary | ICD-10-CM | POA: Diagnosis not present

## 2022-04-28 DIAGNOSIS — K651 Peritoneal abscess: Secondary | ICD-10-CM | POA: Diagnosis not present

## 2022-04-29 ENCOUNTER — Encounter: Payer: Self-pay | Admitting: *Deleted

## 2022-04-29 NOTE — Telephone Encounter (Signed)
This encounter was created in error - please disregard.

## 2022-04-30 ENCOUNTER — Ambulatory Visit (INDEPENDENT_AMBULATORY_CARE_PROVIDER_SITE_OTHER): Payer: Medicare Other | Admitting: Family Medicine

## 2022-04-30 ENCOUNTER — Ambulatory Visit: Payer: Medicare Other | Admitting: Family Medicine

## 2022-04-30 ENCOUNTER — Encounter: Payer: Self-pay | Admitting: Family Medicine

## 2022-04-30 VITALS — BP 118/64 | HR 68 | Ht 66.0 in | Wt 210.6 lb

## 2022-04-30 DIAGNOSIS — E1121 Type 2 diabetes mellitus with diabetic nephropathy: Secondary | ICD-10-CM | POA: Diagnosis not present

## 2022-04-30 DIAGNOSIS — K750 Abscess of liver: Secondary | ICD-10-CM | POA: Diagnosis not present

## 2022-04-30 NOTE — Patient Instructions (Addendum)
Thank you for coming to the office today.  Finish Augmentin course.  Keep an eye on the belly button, but agree that no further intervention is the plan.  Okay to take Metformin XR '500mg'$  x 4 = at once.  No additional insulin or other therapy  Avoid Ozempic for a period of time, can revisit down the road if need.   Please schedule a Follow-up Appointment to: Return if symptoms worsen or fail to improve.  If you have any other questions or concerns, please feel free to call the office or send a message through [place]. You may also schedule an earlier appointment if necessary.  Additionally, you may be receiving a survey about your experience at our office within a few days to 1 week by e-mail or mail. We value your feedback.  Nobie Putnam, DO [place]

## 2022-04-30 NOTE — Progress Notes (Signed)
Subjective:    Patient ID: Modena Nunnery, female    DOB: [birth year], 77 y.o.   MRN: [id number]  SHIRLE STARKES is a 77 y.o. female presenting on 04/30/2022 for Diabetes and Hospitalization Follow-up   HPI  Gallbladder surgery removal 03/26/22, did well 2 weeks Had 2 weeks with low grade temps 99-100 She went back to [facility] and did not feel well She did labs and CT abdomen, and showed 5 cm abscess around liver She was placed into hospital 04/19/22 for IV antibiotics, eventually came off eliquis/aspirin, and then by time ready for abscess IR drainage, nothing to drain They did cultures Sent to [facility] Infectious Disease  Also small abscess behind navel, mostly clear drainage, small amount of drainage (post op site from robotic surgery)  Discharged to home and did home IV antibiotics Sat 2/24 > Tues 2/27, then [facility] 2[facility]28 switched to oral antibiotics with Augmentin twice a day for 20 days.  Diabetes medications On Tradjenta '5mg'$  On Metformin XR '500mg'$  x 4  now, '2000mg'$  daily, was previously '1000mg'$  daily  No longer on insulin from hospital  A1c 7.9 (04/25/22)  Eliquis until beginning of June 2024    HOSPITAL FOLLOW-UP VISIT  Hospital/Location: [facility] Date of Admission: 04/19/22 Date of Discharge: 04/28/22 Transitions of care telephone call: Completed [place] Sep 04/29/22  Reason for Admission: Hepatic Abscess  - Hospital H&P and Discharge Summary have been reviewed - Patient presents today 2 days after recent hospitalization. Brief summary of recent course, patient had symptoms of abdominal pain fever, hospitalized, liver abscess post op from cholecystectomy treated with IV antibiotics discharged to home on oral augmentin course.  - Today reports overall has done well after discharge. Symptoms of pain have improved  - New medications on discharge: Augmentin - Changes to current meds on discharge: none  I have reviewed the discharge medication list, and have reconciled the  current and discharge medications today.   Current Outpatient Medications:    acetaminophen (TYLENOL) 650 MG CR tablet, Take 1,950 mg by mouth daily., Disp: , Rfl:    amLODipine (NORVASC) 2.5 MG tablet, Take 2.5 mg by mouth daily., Disp: , Rfl:    apixaban (ELIQUIS) 5 MG TABS tablet, Take 1 tablet (5 mg total) by mouth 2 (two) times daily., Disp: 180 tablet, Rfl: 1   aspirin EC (ASPIRIN 81) 81 MG tablet, Take 81 mg by mouth daily., Disp: , Rfl:    bisacodyl (DULCOLAX) 5 MG EC tablet, Take 2 tablets (10 mg total) by mouth daily as needed for moderate constipation., Disp: 30 tablet, Rfl: 0   calcium carbonate (TUMS - DOSED IN MG ELEMENTAL CALCIUM) 500 MG chewable tablet, Chew 2 tablets (400 mg of elemental calcium total) by mouth 2 (two) times daily., Disp: 180 tablet, Rfl: 0   enoxaparin (LOVENOX) 40 MG/0.4ML injection, Inject 40 mg into the skin daily., Disp: , Rfl:    ezetimibe (ZETIA) 10 MG tablet, TAKE 1 TABLET BY MOUTH DAILY, Disp: 90 tablet, Rfl: 0   FLUoxetine (PROZAC) 10 MG capsule, TAKE 1 CAPSULE BY MOUTH DAILY, Disp: 90 capsule, Rfl: 3   fluticasone (FLONASE) 50 MCG/ACT nasal spray, Place 2 sprays into both nostrils daily. (Patient taking differently: Place 2-3 sprays into both nostrils at bedtime as needed.), Disp: 16 g, Rfl: 0   furosemide (LASIX) 20 MG tablet, Take 1 tablet (20 mg total) by mouth daily as needed for edema or fluid., Disp: 30 tablet, Rfl: 0   Lancets (ONETOUCH ULTRASOFT) lancets, Use  to check blood sugar daily, Disp: 100 each, Rfl: 3   levothyroxine (SYNTHROID) 125 MCG tablet, TAKE 1 TABLET BY MOUTH  DAILY BEFORE BREAKFAST, Disp: 90 tablet, Rfl: 3   metFORMIN (GLUCOPHAGE-XR) 500 MG 24 hr tablet, Take 4 tablets (2,000 mg total) by mouth daily with breakfast., Disp: 360 tablet, Rfl: 1   metoprolol succinate (TOPROL XL) 25 MG 24 hr tablet, Take 0.5 tablets (12.5 mg total) by mouth at bedtime., Disp: 7 tablet, Rfl: 0   Multiple Vitamin (MULTIVITAMIN WITH MINERALS) TABS  tablet, Take 1 tablet by mouth daily., Disp: , Rfl:    mupirocin ointment (BACTROBAN) 2 %, Apply 1 Application topically daily., Disp: , Rfl:    nitroGLYCERIN (NITROSTAT) 0.4 MG SL tablet, Place 1 tablet (0.4 mg total) under the tongue every 5 (five) minutes as needed for chest pain., Disp: 30 tablet, Rfl: 11   ondansetron (ZOFRAN-ODT) 4 MG disintegrating tablet, Take 1 tablet (4 mg total) by mouth every 8 (eight) hours as needed for nausea or vomiting., Disp: 30 tablet, Rfl: 0   ONETOUCH ULTRA test strip, Use to check blood sugar daily, Disp: 100 each, Rfl: 3   polyethylene glycol (MIRALAX / GLYCOLAX) 17 g packet, Take 17 g by mouth daily as needed., Disp: 30 each, Rfl: 0   rosuvastatin (CRESTOR) 20 MG tablet, TAKE 1 TABLET BY MOUTH DAILY, Disp: 90 tablet, Rfl: 3   TRADJENTA 5 MG TABS tablet, Take 1 tablet (5 mg total) by mouth daily., Disp: 90 tablet, Rfl: 1   traMADol (ULTRAM) 50 MG tablet, Take 50 mg by mouth every 6 (six) hours as needed., Disp: , Rfl:   ------------------------------------------------------------------------- Social History   Tobacco Use   Smoking status: Never   Smokeless tobacco: Never  Vaping Use   Vaping Use: Never used  Substance Use Topics   Alcohol use: No   Drug use: No    Review of Systems Per HPI unless specifically indicated above     Objective:    BP 118/64   Pulse 68   Ht '5\' 6"'$  (1.676 m)   Wt 210 lb 9.6 oz (95.5 kg)   SpO2 99%   BMI 33.99 kg/m   Wt Readings from Last 3 Encounters:  04/30/22 210 lb 9.6 oz (95.5 kg)  03/08/22 231 lb (104.8 kg)  03/02/22 229 lb 12.8 oz (104.2 kg)    Physical Exam Vitals and nursing note reviewed.  Constitutional:      General: She is not in acute distress.    Appearance: Normal appearance. She is well-developed. She is obese. She is not diaphoretic.     Comments: Well-appearing, comfortable, cooperative  HENT:     Head: Normocephalic and atraumatic.  Eyes:     General:        Right eye: No discharge.         Left eye: No discharge.     Conjunctiva/sclera: Conjunctivae normal.  Cardiovascular:     Rate and Rhythm: Normal rate.  Pulmonary:     Effort: Pulmonary effort is normal.  Skin:    General: Skin is warm and dry.     Findings: No erythema or rash.  Neurological:     Mental Status: She is alert and oriented to person, place, and time.  Psychiatric:        Mood and Affect: Mood normal.        Behavior: Behavior normal.        Thought Content: Thought content normal.     Comments: Well  groomed, good eye contact, normal speech and thoughts      Results for orders placed or performed in visit on 04/30/22  Hemoglobin A1c  Result Value Ref Range   Hemoglobin A1C 7.9       Assessment & Plan:   Problem List Items Addressed This Visit     Controlled type 2 diabetes mellitus with diabetic nephropathy ([place]) - Primary   Relevant Medications   metFORMIN (GLUCOPHAGE-XR) 500 MG 24 hr tablet   Other Visit Diagnoses     Hepatic abscess           HFU S/p hepatic abscess following cholecystectomy previously [facility] Hospitalization Reviewed discharge summary Continue Augmentin oral antibiotic for 20 days on discharge, completed IV antibiotic therapy No further IR drainage of abscess  Umbilical drainage, seems post op, no active infection  Type 2 Diabetes A1c 7.9, previously done in hospital CBG 160-230 range approx Has been higher due to infection overall but has not had complication Remains OFF Ozempic due to issues she had with biliary disease On Tradjenta and Metformin XR '500mg'$  x 4 = '2000mg'$  daily now Continue current course, no changes to treatment, suspect improvement longer time out from infection / gallbladder / hepatic abscess  Finish Eliquis through 07/28/22 6 month total for the previously provoked DVT   No orders of the defined types were placed in this encounter.   Follow up plan: Return if symptoms worsen or fail to improve.   Nobie Putnam,  [facility] 04/30/2022, 11:36 AM

## 2022-05-19 ENCOUNTER — Encounter: Payer: Self-pay | Admitting: Family Medicine

## 2022-05-19 DIAGNOSIS — K750 Abscess of liver: Secondary | ICD-10-CM | POA: Diagnosis not present

## 2022-05-19 DIAGNOSIS — R932 Abnormal findings on diagnostic imaging of liver and biliary tract: Secondary | ICD-10-CM | POA: Diagnosis not present

## 2022-05-20 DIAGNOSIS — R935 Abnormal findings on diagnostic imaging of other abdominal regions, including retroperitoneum: Secondary | ICD-10-CM | POA: Diagnosis not present

## 2022-05-20 DIAGNOSIS — Z79899 Other long term (current) drug therapy: Secondary | ICD-10-CM | POA: Diagnosis not present

## 2022-05-20 DIAGNOSIS — Z9049 Acquired absence of other specified parts of digestive tract: Secondary | ICD-10-CM | POA: Diagnosis not present

## 2022-05-20 DIAGNOSIS — T8143XA Infection following a procedure, organ and space surgical site, initial encounter: Secondary | ICD-10-CM | POA: Diagnosis not present

## 2022-05-20 DIAGNOSIS — Z9189 Other specified personal risk factors, not elsewhere classified: Secondary | ICD-10-CM | POA: Diagnosis not present

## 2022-05-24 ENCOUNTER — Other Ambulatory Visit: Payer: Self-pay | Admitting: Cardiovascular Disease

## 2022-05-25 NOTE — Telephone Encounter (Signed)
Lmovm to verify amlodipine told to hold at previous ov.

## 2022-05-26 NOTE — Telephone Encounter (Signed)
Lmovm to verify Amlodipine.

## 2022-05-28 NOTE — Telephone Encounter (Signed)
Based on pt's medication history of Amlodipine 2.5 1 tablet daily. Pt has been taking this medication for years. Pt's medication sent to pt's pharmacy as requested. Confirmation received.

## 2022-05-31 ENCOUNTER — Encounter: Payer: Self-pay | Admitting: Family Medicine

## 2022-06-06 ENCOUNTER — Other Ambulatory Visit: Payer: Self-pay | Admitting: Cardiovascular Disease

## 2022-06-07 NOTE — Progress Notes (Unsigned)
Cardiology Office Note  Date:  06/08/2022   ID:  Ambrose FinlandLindia M Brendlinger, DOB 10/22/1945, MRN [id number]  PCP:  Smitty CordsKaramalegos, Alexander J, DO   Chief Complaint  Patient presents with   Follow-up    Patient denies new or acute cardiac problems/concerns today.      HPI:  Ms. Nadara EatonLaFrance is a pleasant [age] year old woman with past medical history of CAD June 2015, [place]  thrombectomy and drug-eluting stent placement to the LAD.  EF 45%  echocardiogram in October 2015 showed normalization of LV function, at 55-60%.  hypertension, hyperlipidemia,  diabetes morbid obesity son passed in December 2016. She presents for follow-up of her coronary artery disease, follow-up after sepsis, home non-STEMI, DVT  Last seen by myself in clinic November 2023 Seen by one of our providers January 2024  Admitted to the hospital 01/27/22 - 02/06/22 with septic shock secondary to cholecystitis/gallstones and NSTEMI. Heparin drip was started for her NSTEMI, which was felt to be secondary to her sepsis.   Echo revealed EF 60-65% grade I DD.  Myoview showed findings are consistent with no ischemia and no prior myocardial infarction, low risk study.  underwent percutaneous cholecystostomy stent that later had to be replaced d/t abnormal positioning. She developed AKI on CKD, requiring pressor support.  LE venous doppler was positive for acute occlusive DVT in the left posterior tibial vein and she was eventually discharged on Eliquis.    Since her discharge, she has followed up with her PCP, found to be volume overloaded (+40 lbs) and started on lasix 20 mg once/day and to follow up with nephrology. Evaluated by nephrology on 12/18 (wt 254) and again on 12/26, lasix was increased to 20 mg twice daily, she has diuresed to ~ 5 lbs of her baseline weight, which is 225.   IVC filter placed prior to removing her gallbladder. She will see [facility] on 03/05/21 to discuss her DVT and other  options as she does not want to have an IVC placed.   Drain replaced February 24, 2022 In th er 03/09/22: Fever, pain right side of abdomen, biliary drain placed to suction  03/26/22: gall bladder out Then low grade fevers: abscess noted x3 Back in hospital, on ABX IV, 6 days , then home on ABX Finished last week,   Daughter died,ovarian cancer  since 01/23/22 Spiking fever , malaise, hives [facility]22 Did not check for covid, they focused on nontrending troponin averaging 70-80 Fever at home 103.8, taking Tylenol around-the-clock, sometimes NSAIDs  Other lab work reviewed  total cholesterol 115 LDL 43   PMH:   has a past medical history of Coronary artery disease, Depression, Diastolic dysfunction, Endometrial cancer, Gravida 3 para 3, Hyperlipidemia, Hypertension, Hypertensive heart disease, Hypothyroid, Ischemic cardiomyopathy, Menopause, Obstructive sleep apnea on CPAP, Osteoarthritis, PONV (postoperative nausea and vomiting), ST elevation myocardial infarction (STEMI) of anterolateral wall (08/14/2013), T2DM (type 2 diabetes mellitus), and Thyroid cancer.  PSH:    Past Surgical History:  Procedure Laterality Date   CATARACT EXTRACTION W/PHACO Left 08/30/2016   Procedure: CATARACT EXTRACTION PHACO AND INTRAOCULAR LENS PLACEMENT (IOC) Left daibetic;  Surgeon: Lockie MolaBrasington, Chadwick, MD;  [facility];  Service: Ophthalmology;  Laterality: Left;  Diabetic - insulin and oral meds sleep apnea   CATARACT EXTRACTION W/PHACO Right 10/07/2016   Procedure: CATARACT EXTRACTION PHACO AND INTRAOCULAR LENS PLACEMENT (IOC);  Surgeon: Lockie MolaBrasington, Chadwick, MD;  Location: [facility];  Service: Ophthalmology;  Laterality: Right;  Lot#  [id number] H [place]: 00:43.3 AP%: 12.2 CDE: 5.28   CORONARY ANGIOPLASTY WITH STENT PLACEMENT Left 08/14/2013   Procedure: CORONARY ANGIOPLASTY WITH STENT PLACEMENT (3.5 x 23 mm Xience Alpine DES to LAD); Location: [facility], [place],  [place]; Surgeon: Artist Pais, MD   GANGLION CYST EXCISION Right 1980   IR EXCHANGE BILIARY DRAIN  02/23/2022   IR PERC CHOLECYSTOSTOMY  01/28/2022   IR PERC CHOLECYSTOSTOMY  01/31/2022   LAPAROSCOPIC HYSTERECTOMY     THYROIDECTOMY  1995   total   TOTAL KNEE ARTHROPLASTY Right 12/18/2020   Procedure: RIGHT TOTAL KNEE ARTHROPLASTY;  Surgeon: Juanell Fairly, MD;  Location: [facility];  Service: Orthopedics;  Laterality: Right;   TOTAL KNEE ARTHROPLASTY Left 12/10/2021   Procedure: TOTAL KNEE ARTHROPLASTY;  Surgeon: Juanell Fairly, MD;  Location: [facility];  Service: Orthopedics;  Laterality: Left;    Current Outpatient Medications  Medication Sig Dispense Refill   acetaminophen (TYLENOL) 650 MG CR tablet Take 1,950 mg by mouth daily.     apixaban (ELIQUIS) 5 MG TABS tablet Take 1 tablet (5 mg total) by mouth 2 (two) times daily. 180 tablet 1   aspirin EC (ASPIRIN 81) 81 MG tablet Take 81 mg by mouth daily.     calcium carbonate (TUMS - DOSED IN MG ELEMENTAL CALCIUM) 500 MG chewable tablet Chew 2 tablets (400 mg of elemental calcium total) by mouth 2 (two) times daily. 180 tablet 0   ezetimibe (ZETIA) 10 MG tablet TAKE 1 TABLET BY MOUTH DAILY 90 tablet 0   FLUoxetine (PROZAC) 10 MG capsule TAKE 1 CAPSULE BY MOUTH DAILY 90 capsule 3   fluticasone (FLONASE) 50 MCG/ACT nasal spray Place 2 sprays into both nostrils daily. (Patient taking differently: Place 2-3 sprays into both nostrils at bedtime as needed.) 16 g 0   furosemide (LASIX) 20 MG tablet Take 1 tablet (20 mg total) by mouth daily as needed for edema or fluid. 30 tablet 0   Lancets (ONETOUCH ULTRASOFT) lancets Use to check blood sugar daily 100 each 3   levothyroxine (SYNTHROID) 125 MCG tablet TAKE 1 TABLET BY MOUTH  DAILY BEFORE BREAKFAST 90 tablet 3   metFORMIN (GLUCOPHAGE-XR) 500 MG 24 hr tablet Take 4 tablets (2,000 mg total) by mouth daily with breakfast. 360 tablet 1   metoprolol succinate (TOPROL XL) 25 MG 24 hr tablet Take 0.5  tablets (12.5 mg total) by mouth at bedtime. 7 tablet 0   Multiple Vitamin (MULTIVITAMIN WITH MINERALS) TABS tablet Take 1 tablet by mouth daily.     nitroGLYCERIN (NITROSTAT) 0.4 MG SL tablet Place 1 tablet (0.4 mg total) under the tongue every 5 (five) minutes as needed for chest pain. 30 tablet 11   ONETOUCH ULTRA test strip Use to check blood sugar daily 100 each 3   rosuvastatin (CRESTOR) 20 MG tablet TAKE 1 TABLET BY MOUTH DAILY 90 tablet 3   TRADJENTA 5 MG TABS tablet Take 1 tablet (5 mg total) by mouth daily. 90 tablet 1   polyethylene glycol (MIRALAX / GLYCOLAX) 17 g packet Take 17 g by mouth daily as needed. (Patient not taking: Reported on 06/08/2022) 30 each 0   traMADol (ULTRAM) 50 MG tablet Take 50 mg by mouth every 6 (six) hours as needed. (Patient not taking: Reported on 06/08/2022)     No current facility-administered medications for this visit.    Allergies:   Patient has no known allergies.   Social History:  The patient  reports that she has never smoked. She has never  used smokeless tobacco. She reports that she does not drink alcohol and does not use drugs.   Family History:   family history includes Heart attack in her father; Heart disease (age of onset: 80) in her father; Heart failure in her father; Hyperlipidemia in her father; Hypertension in her father; Pulmonary fibrosis in her son.    Review of Systems: Review of Systems  Constitutional: Negative.   HENT: Negative.    Respiratory: Negative.    Cardiovascular: Negative.   Gastrointestinal: Negative.   Musculoskeletal: Negative.   Neurological: Negative.   Psychiatric/Behavioral: Negative.    All other systems reviewed and are negative.   PHYSICAL EXAM: VS:  BP (!) 156/68 (BP Location: Left Arm, Patient Position: Sitting)   Pulse (!) 58   Ht 5\' 6"  (1.676 m)   Wt 217 lb 3.2 oz (98.5 kg)   SpO2 98%   BMI 35.06 kg/m  , BMI Body mass index is 35.06 kg/m. Constitutional:  oriented to person, place, and  time. No distress.  HENT:  Head: Grossly normal Eyes:  no discharge. No scleral icterus.  Neck: No JVD, no carotid bruits  Cardiovascular: Regular rate and rhythm, no murmurs appreciated Pulmonary/Chest: Clear to auscultation bilaterally, no wheezes or rails Abdominal: Soft.  no distension.  no tenderness.  Musculoskeletal: Normal range of motion Neurological:  normal muscle tone. Coordination normal. No atrophy Skin: Skin warm and dry Psychiatric: normal affect, pleasant  Recent Labs: 10/01/2021: TSH 1.73 01/27/2022: B Natriuretic Peptide 38.4 02/03/2022: Magnesium 2.0 03/08/2022: ALT 30; BUN 12; Creatinine, Ser 0.80; Hemoglobin 9.9; Platelets 386; Potassium 3.9; Sodium 132    Lipid Panel Lab Results  Component Value Date   CHOL 51 01/27/2022   HDL <10 (L) 01/27/2022   LDLCALC UNABLE TO CALCULATE 01/27/2022   TRIG 177 (H) 01/27/2022     Wt Readings from Last 3 Encounters:  06/08/22 217 lb 3.2 oz (98.5 kg)  04/30/22 210 lb 9.6 oz (95.5 kg)  03/08/22 231 lb (104.8 kg)     ASSESSMENT AND PLAN:  GAll bladder disease Extensive recent history with multiple hospitalizations for gallbladder disease requiring drain, cholecystectomy, residual abscess noted requiring long-term antibiotics Finally feeling better, off antibiotics this past week, has follow-up with ID  Coronary artery disease of native artery of native heart with stable angina pectoris (HCC) -  Currently with no symptoms of angina. No further workup at this time. Continue current medication regimen.  Essential (primary) hypertension - Plan: EKG 12-Lead Blood pressure is well controlled on today's visit on recheck. No changes made to the medications.  Mixed hyperlipidemia -  on Crestor Zetia, cholesterol well controlled  Type 2 diabetes mellitus with other circulatory complication, with long-term current use of insulin (HCC) - A1C 7.9 up from the 6 range Weight has dropped,  Previously on Ozempic  Chronic kidney  disease (CKD) stage G3a/A1,  Stable numbers Followed by primary care  Obstructive sleep apnea on CPAP Compliant with her cpap (old machine?)  Obesity Weight down after recent hospitalizations Recommend she continue to follow low carbohydrate diet  Ischemic cardiomyopathy - EF 50 to 55% Appears euvolemic  DVT: On eliquis 6 months   Total encounter time more than 40 minutes  Greater than 50% was spent in counseling and coordination of care with the patient   No orders of the defined types were placed in this encounter.    Signed, Dossie Arbour, M.D., Ph.D. 06/08/2022  [facility] [place], [place] [phone]

## 2022-06-07 NOTE — Telephone Encounter (Signed)
Patient is scheduled to see Dr. Mariah Milling tomorrow. Please refill if appropriate. Thank you!

## 2022-06-08 ENCOUNTER — Ambulatory Visit: Payer: Medicare Other | Attending: Cardiovascular Disease | Admitting: Cardiovascular Disease

## 2022-06-08 ENCOUNTER — Encounter: Payer: Self-pay | Admitting: Cardiovascular Disease

## 2022-06-08 VITALS — BP 135/70 | HR 58 | Ht 66.0 in | Wt 217.2 lb

## 2022-06-08 DIAGNOSIS — K810 Acute cholecystitis: Secondary | ICD-10-CM | POA: Diagnosis not present

## 2022-06-08 DIAGNOSIS — I255 Ischemic cardiomyopathy: Secondary | ICD-10-CM

## 2022-06-08 DIAGNOSIS — E1169 Type 2 diabetes mellitus with other specified complication: Secondary | ICD-10-CM | POA: Diagnosis not present

## 2022-06-08 DIAGNOSIS — I82442 Acute embolism and thrombosis of left tibial vein: Secondary | ICD-10-CM

## 2022-06-08 DIAGNOSIS — I10 Essential (primary) hypertension: Secondary | ICD-10-CM | POA: Diagnosis not present

## 2022-06-08 DIAGNOSIS — E785 Hyperlipidemia, unspecified: Secondary | ICD-10-CM | POA: Diagnosis not present

## 2022-06-08 DIAGNOSIS — E1159 Type 2 diabetes mellitus with other circulatory complications: Secondary | ICD-10-CM | POA: Diagnosis not present

## 2022-06-08 DIAGNOSIS — Z794 Long term (current) use of insulin: Secondary | ICD-10-CM

## 2022-06-08 DIAGNOSIS — R600 Localized edema: Secondary | ICD-10-CM

## 2022-06-08 DIAGNOSIS — I25118 Atherosclerotic heart disease of native coronary artery with other forms of angina pectoris: Secondary | ICD-10-CM | POA: Diagnosis not present

## 2022-06-08 MED ORDER — FUROSEMIDE 20 MG PO TABS
20.0000 mg | ORAL_TABLET | Freq: Every day | ORAL | 0 refills | Status: DC | PRN
Start: 2022-06-08 — End: 2022-08-09

## 2022-06-08 MED ORDER — ROSUVASTATIN CALCIUM 20 MG PO TABS
20.0000 mg | ORAL_TABLET | Freq: Every day | ORAL | 3 refills | Status: DC
Start: 2022-06-08 — End: 2023-03-25

## 2022-06-08 MED ORDER — EZETIMIBE 10 MG PO TABS: 10.0000 mg | ORAL_TABLET | Freq: Every day | ORAL | 3 refills | Status: DC

## 2022-06-08 NOTE — Patient Instructions (Signed)
Medication Instructions:  No changes  If you need a refill on your cardiac medications before your next appointment, please call your pharmacy.   Lab work: No new labs needed  Testing/Procedures: No new testing needed  Follow-Up: At [facility], you and your health needs are our priority.  As part of our continuing mission to provide you with exceptional heart care, we have created designated Provider Care Teams.  These Care Teams include your primary Cardiologist (physician) and Advanced Practice Providers (APPs -  Physician Assistants and Nurse Practitioners) who all work together to provide you with the care you need, when you need it.  You will need a follow up appointment in 12 months  Providers on your designated Care Team:   Christopher Berge, NP Ryan Dunn, PA-C Cadence Furth, PA-C  COVID-19 Vaccine Information can be found at: [email or web address] For questions related to vaccine distribution or appointments, please email [email or web address] or call [phone].   

## 2022-06-10 DIAGNOSIS — I10 Essential (primary) hypertension: Secondary | ICD-10-CM | POA: Diagnosis not present

## 2022-06-10 DIAGNOSIS — R600 Localized edema: Secondary | ICD-10-CM | POA: Diagnosis not present

## 2022-06-10 DIAGNOSIS — E871 Hypo-osmolality and hyponatremia: Secondary | ICD-10-CM | POA: Diagnosis not present

## 2022-06-10 LAB — PROTEIN / CREATININE RATIO, URINE
Albumin, U: 12
Creatinine, Urine: 79

## 2022-06-15 ENCOUNTER — Telehealth: Payer: Self-pay | Admitting: Family Medicine

## 2022-06-15 NOTE — Telephone Encounter (Signed)
Contacted [name] to schedule their annual wellness visit. Appointment made for 07/02/2022.  Verlee Rossetti; Care Guide Ambulatory Clinical Support [facility] Direct Dial: [phone]

## 2022-06-17 DIAGNOSIS — N178 Other acute kidney failure: Secondary | ICD-10-CM | POA: Diagnosis not present

## 2022-06-17 DIAGNOSIS — R600 Localized edema: Secondary | ICD-10-CM | POA: Diagnosis not present

## 2022-06-17 DIAGNOSIS — I10 Essential (primary) hypertension: Secondary | ICD-10-CM | POA: Diagnosis not present

## 2022-06-17 DIAGNOSIS — T8143XA Infection following a procedure, organ and space surgical site, initial encounter: Secondary | ICD-10-CM | POA: Diagnosis not present

## 2022-06-21 DIAGNOSIS — R262 Difficulty in walking, not elsewhere classified: Secondary | ICD-10-CM | POA: Diagnosis not present

## 2022-06-21 DIAGNOSIS — M25662 Stiffness of left knee, not elsewhere classified: Secondary | ICD-10-CM | POA: Diagnosis not present

## 2022-06-21 DIAGNOSIS — M25661 Stiffness of right knee, not elsewhere classified: Secondary | ICD-10-CM | POA: Diagnosis not present

## 2022-06-21 DIAGNOSIS — M25562 Pain in left knee: Secondary | ICD-10-CM | POA: Diagnosis not present

## 2022-06-25 DIAGNOSIS — M25562 Pain in left knee: Secondary | ICD-10-CM | POA: Diagnosis not present

## 2022-06-25 DIAGNOSIS — M25662 Stiffness of left knee, not elsewhere classified: Secondary | ICD-10-CM | POA: Diagnosis not present

## 2022-06-25 DIAGNOSIS — M25661 Stiffness of right knee, not elsewhere classified: Secondary | ICD-10-CM | POA: Diagnosis not present

## 2022-06-25 DIAGNOSIS — R262 Difficulty in walking, not elsewhere classified: Secondary | ICD-10-CM | POA: Diagnosis not present

## 2022-06-29 DIAGNOSIS — M25662 Stiffness of left knee, not elsewhere classified: Secondary | ICD-10-CM | POA: Diagnosis not present

## 2022-06-29 DIAGNOSIS — R262 Difficulty in walking, not elsewhere classified: Secondary | ICD-10-CM | POA: Diagnosis not present

## 2022-06-29 DIAGNOSIS — M25562 Pain in left knee: Secondary | ICD-10-CM | POA: Diagnosis not present

## 2022-06-29 DIAGNOSIS — M25661 Stiffness of right knee, not elsewhere classified: Secondary | ICD-10-CM | POA: Diagnosis not present

## 2022-07-01 DIAGNOSIS — M25661 Stiffness of right knee, not elsewhere classified: Secondary | ICD-10-CM | POA: Diagnosis not present

## 2022-07-01 DIAGNOSIS — M25562 Pain in left knee: Secondary | ICD-10-CM | POA: Diagnosis not present

## 2022-07-01 DIAGNOSIS — R262 Difficulty in walking, not elsewhere classified: Secondary | ICD-10-CM | POA: Diagnosis not present

## 2022-07-01 DIAGNOSIS — M25662 Stiffness of left knee, not elsewhere classified: Secondary | ICD-10-CM | POA: Diagnosis not present

## 2022-07-02 ENCOUNTER — Ambulatory Visit (INDEPENDENT_AMBULATORY_CARE_PROVIDER_SITE_OTHER): Payer: Medicare Other

## 2022-07-02 VITALS — Wt 217.0 lb

## 2022-07-02 DIAGNOSIS — Z0000 Encounter for general adult medical examination without abnormal findings: Secondary | ICD-10-CM | POA: Diagnosis not present

## 2022-07-02 DIAGNOSIS — H905 Unspecified sensorineural hearing loss: Secondary | ICD-10-CM | POA: Diagnosis not present

## 2022-07-02 NOTE — Patient Instructions (Signed)
Ms. Dimattia , Thank you for taking time to come for your Medicare Wellness Visit. I appreciate your ongoing commitment to your health goals. Please review the following plan we discussed and let me know if I can assist you in the future.   These are the goals we discussed:  Goals      DIET - EAT MORE FRUITS AND VEGETABLES     DIET - INCREASE WATER INTAKE     Patient Stated     02/12/2020, wants to get below 200 pounds     PharmD - Medication Adherence     CARE PLAN ENTRY (see longitudinal plan of care for additional care plan information)   Current Barriers:  Chronic Disease Management support, education, and care coordination needs related to HTN, CAD, T2DM Medication Adherence gap as identified by health plan  Pharmacist Clinical Goal(s):  Over the next 1 days, patient will work with CM Pharmacist to address needs related to medication adherence  Interventions: Assess adherence to losartan 100 mg once daily (as identified by health plan).  Patient denies barriers to taking losartan. Reports using weekly pillboxes as adherence aid Review dispensing history in chart. No gaps in recent refill history noted. Encourage patient to continue adherence to medication and use of weekly pillbox Patient reports often takes carvedilol once daily rather than twice daily as difficult to take evening dose Counsel on rational for taking carvedilol twice daily and discuss methods to aid with adherence to taking evening dose. Note carvedilol comes in extended release formulation, but not covered on current health plan formulary Denies current home monitoring of blood pressure Patient denies further medication questions/concerns at this time.  Patient Self Care Activities:  Attends all scheduled provider appointments  Initial goal documentation      Reduce sugar intake to X grams per day     Recommend eliminating carbs and sweets from diet        This is a list of the screening recommended  for you and due dates:  Health Maintenance  Topic Date Due   Yearly kidney health urinalysis for diabetes  Never done   Zoster (Shingles) Vaccine (1 of 2) Never done   Complete foot exam   07/01/2021   COVID-19 Vaccine (4 - 2023-24 season) 10/30/2021   Eye exam for diabetics  05/29/2022   Flu Shot  09/30/2022   Hemoglobin A1C  10/24/2022   Yearly kidney function blood test for diabetes  03/09/2023   Medicare Annual Wellness Visit  07/02/2023   DTaP/Tdap/Td vaccine (2 - Td or Tdap) 08/03/2023   Pneumonia Vaccine  Completed   DEXA scan (bone density measurement)  Completed   Hepatitis C Screening: USPSTF Recommendation to screen - Ages 77-79 yo.  Completed   HPV Vaccine  Aged Out   Cologuard (Stool DNA test)  Discontinued    Advanced directives: yes  Conditions/risks identified: no  Next appointment: Follow up in one year for your annual wellness visit 07/08/23 @ 10:45 am by phone   Preventive Care 65 Years and Older, Female Preventive care refers to lifestyle choices and visits with your health care provider that can promote health and wellness. What does preventive care include? A yearly physical exam. This is also called an annual well check. Dental exams once or twice a year. Routine eye exams. Ask your health care provider how often you should have your eyes checked. Personal lifestyle choices, including: Daily care of your teeth and gums. Regular physical activity. Eating a healthy diet. Avoiding  tobacco and drug use. Limiting alcohol use. Practicing safe sex. Taking low-dose aspirin every day. Taking vitamin and mineral supplements as recommended by your health care provider. What happens during an annual well check? The services and screenings done by your health care provider during your annual well check will depend on your age, overall health, lifestyle risk factors, and family history of disease. Counseling  Your health care provider may ask you questions about  your: Alcohol use. Tobacco use. Drug use. Emotional well-being. Home and relationship well-being. Sexual activity. Eating habits. History of falls. Memory and ability to understand (cognition). Work and work Astronomer. Reproductive health. Screening  You may have the following tests or measurements: Height, weight, and BMI. Blood pressure. Lipid and cholesterol levels. These may be checked every 5 years, or more frequently if you are over 19 years old. Skin check. Lung cancer screening. You may have this screening every year starting at age 59 if you have a 30-pack-year history of smoking and currently smoke or have quit within the past 15 years. Fecal occult blood test (FOBT) of the stool. You may have this test every year starting at age 80. Flexible sigmoidoscopy or colonoscopy. You may have a sigmoidoscopy every 5 years or a colonoscopy every 10 years starting at age 32. Hepatitis C blood test. Hepatitis B blood test. Sexually transmitted disease (STD) testing. Diabetes screening. This is done by checking your blood sugar (glucose) after you have not eaten for a while (fasting). You may have this done every 1-3 years. Bone density scan. This is done to screen for osteoporosis. You may have this done starting at age 24. Mammogram. This may be done every 1-2 years. Talk to your health care provider about how often you should have regular mammograms. Talk with your health care provider about your test results, treatment options, and if necessary, the need for more tests. Vaccines  Your health care provider may recommend certain vaccines, such as: Influenza vaccine. This is recommended every year. Tetanus, diphtheria, and acellular pertussis (Tdap, Td) vaccine. You may need a Td booster every 10 years. Zoster vaccine. You may need this after age 17. Pneumococcal 13-valent conjugate (PCV13) vaccine. One dose is recommended after age 34. Pneumococcal polysaccharide (PPSV23) vaccine.  One dose is recommended after age 36. Talk to your health care provider about which screenings and vaccines you need and how often you need them. This information is not intended to replace advice given to you by your health care provider. Make sure you discuss any questions you have with your health care provider. Document Released: 03/14/2015 Document Revised: 11/05/2015 Document Reviewed: 12/17/2014 Elsevier Interactive Patient Education  2017 [employer].  Fall Prevention in the Home Falls can cause injuries. They can happen to people of all ages. There are many things you can do to make your home safe and to help prevent falls. What can I do on the outside of my home? Regularly fix the edges of walkways and driveways and fix any cracks. Remove anything that might make you trip as you walk through a door, such as a raised step or threshold. Trim any bushes or trees on the path to your home. Use bright outdoor lighting. Clear any walking paths of anything that might make someone trip, such as rocks or tools. Regularly check to see if handrails are loose or broken. Make sure that both sides of any steps have handrails. Any raised decks and porches should have guardrails on the edges. Have any leaves, snow,  or ice cleared regularly. Use sand or salt on walking paths during winter. Clean up any spills in your garage right away. This includes oil or grease spills. What can I do in the bathroom? Use night lights. Install grab bars by the toilet and in the tub and shower. Do not use towel bars as grab bars. Use non-skid mats or decals in the tub or shower. If you need to sit down in the shower, use a plastic, non-slip stool. Keep the floor dry. Clean up any water that spills on the floor as soon as it happens. Remove soap buildup in the tub or shower regularly. Attach bath mats securely with double-sided non-slip rug tape. Do not have throw rugs and other things on the floor that can make  you trip. What can I do in the bedroom? Use night lights. Make sure that you have a light by your bed that is easy to reach. Do not use any sheets or blankets that are too big for your bed. They should not hang down onto the floor. Have a firm chair that has side arms. You can use this for support while you get dressed. Do not have throw rugs and other things on the floor that can make you trip. What can I do in the kitchen? Clean up any spills right away. Avoid walking on wet floors. Keep items that you use a lot in easy-to-reach places. If you need to reach something above you, use a strong step stool that has a grab bar. Keep electrical cords out of the way. Do not use floor polish or wax that makes floors slippery. If you must use wax, use non-skid floor wax. Do not have throw rugs and other things on the floor that can make you trip. What can I do with my stairs? Do not leave any items on the stairs. Make sure that there are handrails on both sides of the stairs and use them. Fix handrails that are broken or loose. Make sure that handrails are as long as the stairways. Check any carpeting to make sure that it is firmly attached to the stairs. Fix any carpet that is loose or worn. Avoid having throw rugs at the top or bottom of the stairs. If you do have throw rugs, attach them to the floor with carpet tape. Make sure that you have a light switch at the top of the stairs and the bottom of the stairs. If you do not have them, ask someone to add them for you. What else can I do to help prevent falls? Wear shoes that: Do not have high heels. Have rubber bottoms. Are comfortable and fit you well. Are closed at the toe. Do not wear sandals. If you use a stepladder: Make sure that it is fully opened. Do not climb a closed stepladder. Make sure that both sides of the stepladder are locked into place. Ask someone to hold it for you, if possible. Clearly mark and make sure that you can  see: Any grab bars or handrails. First and last steps. Where the edge of each step is. Use tools that help you move around (mobility aids) if they are needed. These include: Canes. Walkers. Scooters. Crutches. Turn on the lights when you go into a dark area. Replace any light bulbs as soon as they burn out. Set up your furniture so you have a clear path. Avoid moving your furniture around. If any of your floors are uneven, fix them. If there are  any pets around you, be aware of where they are. Review your medicines with your doctor. Some medicines can make you feel dizzy. This can increase your chance of falling. Ask your doctor what other things that you can do to help prevent falls. This information is not intended to replace advice given to you by your health care provider. Make sure you discuss any questions you have with your health care provider. Document Released: 12/12/2008 Document Revised: 07/24/2015 Document Reviewed: 03/22/2014 Elsevier Interactive Patient Education  2017 [employer].

## 2022-07-02 NOTE — Progress Notes (Signed)
I connected with  [name] on 07/02/22 by a audio enabled telemedicine application and verified that I am speaking with the correct person using two identifiers.  Patient Location: Home  Provider Location: Office/Clinic  I discussed the limitations of evaluation and management by telemedicine. The patient expressed understanding and agreed to proceed.  Subjective:   [name] is a 77 y.o. female who presents for Medicare Annual (Subsequent) preventive examination.  Review of Systems     Cardiac Risk Factors include: advanced age (>13men, >61 women);diabetes mellitus;dyslipidemia     Objective:    There were no vitals filed for this visit. There is no height or weight on file to calculate BMI.     07/02/2022   12:22 PM 03/08/2022   10:40 PM 02/01/2022   10:00 PM 01/28/2022    3:59 AM 01/27/2022    9:42 AM 12/10/2021   12:15 PM 12/10/2021    6:35 AM  Advanced Directives  Does Patient Have a Medical Advance Directive? Yes No  Yes Yes Yes Yes  Type of Estate agent of [place];Living will   Healthcare Power of [place];Living will Healthcare Power of [place];Living will Healthcare Power of [place];Living will Healthcare Power of [place];Living will  Does patient want to make changes to medical advance directive? No - Patient declined  No - Patient declined   No - Patient declined No - Patient declined  Copy of Healthcare Power of Attorney in Chart? Yes - validated most recent copy scanned in chart (See row information)      Yes - validated most recent copy scanned in chart (See row information)    Current Medications (verified) Outpatient Encounter Medications as of 07/02/2022  Medication Sig   acetaminophen (TYLENOL) 650 MG CR tablet Take 1,950 mg by mouth daily.   apixaban (ELIQUIS) 5 MG TABS tablet Take 1 tablet (5 mg total) by mouth 2 (two) times daily.   ezetimibe (ZETIA) 10 MG tablet Take 1 tablet (10 mg total) by mouth daily.    FLUoxetine (PROZAC) 10 MG capsule TAKE 1 CAPSULE BY MOUTH DAILY   fluticasone (FLONASE) 50 MCG/ACT nasal spray Place 2 sprays into both nostrils daily. (Patient taking differently: Place 2-3 sprays into both nostrils at bedtime as needed.)   furosemide (LASIX) 20 MG tablet Take 1 tablet (20 mg total) by mouth daily as needed for edema or fluid.   Lancets (ONETOUCH ULTRASOFT) lancets Use to check blood sugar daily   levothyroxine (SYNTHROID) 125 MCG tablet TAKE 1 TABLET BY MOUTH  DAILY BEFORE BREAKFAST   metFORMIN (GLUCOPHAGE-XR) 500 MG 24 hr tablet Take 4 tablets (2,000 mg total) by mouth daily with breakfast.   metoprolol succinate (TOPROL XL) 25 MG 24 hr tablet Take 0.5 tablets (12.5 mg total) by mouth at bedtime.   nitroGLYCERIN (NITROSTAT) 0.4 MG SL tablet Place 1 tablet (0.4 mg total) under the tongue every 5 (five) minutes as needed for chest pain.   ONETOUCH ULTRA test strip Use to check blood sugar daily   rosuvastatin (CRESTOR) 20 MG tablet Take 1 tablet (20 mg total) by mouth daily.   TRADJENTA 5 MG TABS tablet Take 1 tablet (5 mg total) by mouth daily.   traMADol (ULTRAM) 50 MG tablet Take 50 mg by mouth every 6 (six) hours as needed.   [DISCONTINUED] aspirin EC (ASPIRIN 81) 81 MG tablet Take 81 mg by mouth daily.   [DISCONTINUED] calcium carbonate (TUMS - DOSED IN MG ELEMENTAL CALCIUM) 500 MG chewable tablet Chew 2  tablets (400 mg of elemental calcium total) by mouth 2 (two) times daily.   [DISCONTINUED] Multiple Vitamin (MULTIVITAMIN WITH MINERALS) TABS tablet Take 1 tablet by mouth daily.   [DISCONTINUED] polyethylene glycol (MIRALAX / GLYCOLAX) 17 g packet Take 17 g by mouth daily as needed. (Patient not taking: Reported on 06/08/2022)   No facility-administered encounter medications on file as of 07/02/2022.    Allergies (verified) Patient has no known allergies.   History: Past Medical History:  Diagnosis Date   Coronary artery disease    a. 07/2013 STEMI/PCI ([place]): LM nl, LAD  100p (thrombectomy & 3.5x23 Alpine DES), LCX nl, RCA nl, EF 45%.   Depression    Diastolic dysfunction    a.) TTE 11/26/2020 - LVEF 50-55%; G2DD.   Endometrial cancer (HCC)    Gravida 3 para 3    Hyperlipidemia    Hypertension    Hypertensive heart disease    Hypothyroid    Ischemic cardiomyopathy    a.) 07/2013 EF 45% @ time of STEMI; b.) 11/2013 Echo: EF 55-60%, mildly dil LA, nl RV fxn. c.) TTE 07/29/2015 - LVEF 55-60%, mild LA and RV dilation. d.) TTE 11/26/2020 - LVEF 50-55%; G2DD.   Menopause    Obstructive sleep apnea on CPAP    Osteoarthritis    PONV (postoperative nausea and vomiting)    ST elevation myocardial infarction (STEMI) of anterolateral wall (HCC) 08/14/2013   a.) LVEF reduced at 45%; Tx'd with trombectomy and PCI to 100% LAD; 3.5 x 23 mm Xience Alpine DES placed.   T2DM (type 2 diabetes mellitus) (HCC)    Thyroid cancer (HCC)    a.) s/p total thyroidectomy. b.) on daily levothyroxine.   Past Surgical History:  Procedure Laterality Date   CATARACT EXTRACTION W/PHACO Left 08/30/2016   Procedure: CATARACT EXTRACTION PHACO AND INTRAOCULAR LENS PLACEMENT (IOC) Left daibetic;  Surgeon: Lockie Mola, MD;  [facility];  Service: Ophthalmology;  Laterality: Left;  Diabetic - insulin and oral meds sleep apnea   CATARACT EXTRACTION W/PHACO Right 10/07/2016   Procedure: CATARACT EXTRACTION PHACO AND INTRAOCULAR LENS PLACEMENT (IOC);  Surgeon: Lockie Mola, MD;  Location: [facility];  Service: Ophthalmology;  Laterality: Right;  Lot# [id number] H [place]: 00:43.3 AP%: 12.2 CDE: 5.28   CORONARY ANGIOPLASTY WITH STENT PLACEMENT Left 08/14/2013   Procedure: CORONARY ANGIOPLASTY WITH STENT PLACEMENT (3.5 x 23 mm Xience Alpine DES to LAD); [facility]: [facility], [place], [place]; Surgeon: Artist Pais, MD   GANGLION CYST EXCISION Right 1980   IR EXCHANGE BILIARY DRAIN  02/23/2022   IR PERC CHOLECYSTOSTOMY  01/28/2022   IR PERC CHOLECYSTOSTOMY   01/31/2022   LAPAROSCOPIC HYSTERECTOMY     THYROIDECTOMY  1995   total   TOTAL KNEE ARTHROPLASTY Right 12/18/2020   Procedure: RIGHT TOTAL KNEE ARTHROPLASTY;  Surgeon: Juanell Fairly, MD;  Location: [facility];  Service: Orthopedics;  Laterality: Right;   TOTAL KNEE ARTHROPLASTY Left 12/10/2021   Procedure: TOTAL KNEE ARTHROPLASTY;  Surgeon: Juanell Fairly, MD;  Location: [facility];  Service: Orthopedics;  Laterality: Left;   Family History  Problem Relation Age of Onset   Heart attack Father    Heart failure Father    Hypertension Father    Hyperlipidemia Father    Heart disease Father 45       CABG    Pulmonary fibrosis Son    Breast cancer Neg Hx    Social History   Socioeconomic History   Marital status: Widowed    Spouse name: Not  on file   Number of children: 3   Years of education: Not on file   Highest education level: Not on file  Occupational History   Occupation: retired  Tobacco Use   Smoking status: Never   Smokeless tobacco: Never  Vaping Use   Vaping Use: Never used  Substance and Sexual Activity   Alcohol use: No   Drug use: No   Sexual activity: Not on file  Other Topics Concern   Not on file  Social History Narrative   Not on file   Social Determinants of Health   Financial Resource Strain: Low Risk  (07/02/2022)   Overall Financial Resource Strain (CARDIA)    Difficulty of Paying Living Expenses: Not hard at all  Food Insecurity: No Food Insecurity (07/02/2022)   Hunger Vital Sign    Worried About Running Out of Food in the Last Year: Never true    Ran Out of Food in the Last Year: Never true  Transportation Needs: No Transportation Needs (07/02/2022)   PRAPARE - Administrator, Civil Service (Medical): No    Lack of Transportation (Non-Medical): No  Physical Activity: Insufficiently Active (07/02/2022)   Exercise Vital Sign    Days of Exercise per Week: 3 days    Minutes of Exercise per Session: 30 min  Stress: Stress Concern Present  (07/02/2022)   [employer] of Occupational Health - Occupational Stress Questionnaire    Feeling of Stress : To some extent  Social Connections: Moderately Integrated (03/27/2021)   Social Connection and Isolation Panel [NHANES]    Frequency of Communication with Friends and Family: More than three times a week    Frequency of Social Gatherings with Friends and Family: More than three times a week    Attends Religious Services: More than 4 times per year    Active Member of [employer] or Organizations: Yes    Attends Banker Meetings: More than 4 times per year    Marital Status: Widowed    Tobacco Counseling Counseling given: Not Answered   Clinical Intake:  Pre-visit preparation completed: Yes  Pain : No/denies pain     Nutritional Risks: None Diabetes: Yes CBG done?: No Did pt. bring in CBG monitor from home?: No  How often do you need to have someone help you when you read instructions, pamphlets, or other written materials from your doctor or pharmacy?: 1 - Never  Diabetic?yes Nutrition Risk Assessment:  Has the patient had any N/V/D within the last 2 months?  No  Does the patient have any non-healing wounds?  No  Has the patient had any unintentional weight loss or weight gain?  No   Diabetes:  Is the patient diabetic?  Yes  If diabetic, was a CBG obtained today?  No  Did the patient bring in their glucometer from home?  No  How often do you monitor your CBG's? Every day.   Financial Strains and Diabetes Management:  Are you having any financial strains with the device, your supplies or your medication? No .  Does the patient want to be seen by Chronic Care Management for management of their diabetes?  No  Would the patient like to be referred to a Nutritionist or for Diabetic Management?  No   Diabetic Exams:  Diabetic Eye Exam: Completed 05/18/21. Overdue for diabetic eye exam. Pt has been advised about the importance in completing this exam.   Diabetic Foot Exam: Completed 07/01/20. Pt has been advised about the importance  in completing this exam.   Interpreter Needed?: No  Information entered by :: Kennedy Bucker, LPN   Activities of Daily Living    07/02/2022   12:24 PM 06/29/2022   12:02 AM  In your present state of health, do you have any difficulty performing the following activities:  Hearing? 0 0  Vision? 0 0  Difficulty concentrating or making decisions? 0 0  Walking or climbing stairs? 1 1  Dressing or bathing? 0 0  Doing errands, shopping? 0 0  Preparing Food and eating ? N N  Using the Toilet? N N  In the past six months, have you accidently leaked urine? N N  Do you have problems with loss of bowel control? N N  Managing your Medications? N N  Managing your Finances? N N  Housekeeping or managing your Housekeeping? N N    Patient Care Team: Smitty Cords, DO as PCP - General (Family Medicine) Mariah Milling Tollie Pizza, MD as PCP - Cardiology (Cardiology) Lockie Mola, MD as Referring Physician (Ophthalmology) Antonieta Iba, MD as Consulting Physician (Cardiology)  Indicate any recent Medical Services you may have received from other than [facility] providers in the past year (date may be approximate).     Assessment:   This is a routine wellness examination for Kaleyah.  Hearing/Vision screen Hearing Screening - Comments:: No aids, but going to get some Vision Screening - Comments:: Wears glasses- Dr. Inez Pilgrim  Dietary issues and exercise activities discussed: Current Exercise Habits: Home exercise routine, Type of exercise: Other - see comments (P.T.), Time (Minutes): 30, Frequency (Times/Week): 3, Weekly Exercise (Minutes/Week): 90, Intensity: Mild   Goals Addressed             This Visit's Progress    DIET - INCREASE WATER INTAKE         Depression Screen    07/02/2022   12:21 PM 04/30/2022   11:10 AM 06/11/2021    8:44 AM 03/27/2021   10:29 AM 07/01/2020    9:26 AM 02/12/2020     9:41 AM 01/02/2020    1:48 PM  PHQ 2/9 Scores  PHQ - 2 Score 4 2 0 0 1 1 0  PHQ- 9 Score 10 3 3  3  2     Fall Risk    07/02/2022   12:23 PM 06/29/2022   12:02 AM 04/30/2022   11:10 AM 04/19/2022   10:24 AM 06/11/2021    8:44 AM  Fall Risk   Falls in the past year? 0 0 0 0 0  Number falls in past yr: 0  0  0  Injury with Fall? 0  0  0  Risk for fall due to : No Fall Risks  No Fall Risks  No Fall Risks  Follow up Falls prevention discussed;Falls evaluation completed  Falls evaluation completed  Falls evaluation completed    FALL RISK PREVENTION PERTAINING TO THE HOME:  Any stairs in or around the home? No  If so, are there any without handrails? No  Home free of loose throw rugs in walkways, pet beds, electrical cords, etc? Yes  Adequate lighting in your home to reduce risk of falls? Yes   ASSISTIVE DEVICES UTILIZED TO PREVENT FALLS:  Life alert? No  Use of a cane, walker or w/c? No  Grab bars in the bathroom? No  Shower chair or bench in shower? No  Elevated toilet seat or a handicapped toilet? No   Cognitive Function:  07/02/2022   12:29 PM 02/12/2020    9:43 AM 11/30/2016    1:47 PM  6CIT Screen  What Year? 0 points 0 points 0 points  What month? 0 points 0 points 0 points  What time? 0 points 0 points 0 points  Count back from 20 0 points 0 points 0 points  Months in reverse 0 points 0 points 0 points  Repeat phrase 0 points 0 points 0 points  Total Score 0 points 0 points 0 points    Immunizations Immunization History  Administered Date(s) Administered   Fluad Quad(high Dose 65+) 12/19/2020, 12/11/2021   Influenza Split 12/29/2011   Influenza, High Dose Seasonal PF 11/19/2014, 12/11/2015, 11/30/2016, 12/16/2017   Influenza,inj,Quad PF,6+ Mos 12/20/2012, 12/05/2018   Influenza-Unspecified 12/14/2019   PFIZER(Purple Top)SARS-COV-2 Vaccination 05/02/2019, 05/23/2019, 11/26/2019   PPD Test 01/19/2016   Pneumococcal Conjugate-13 01/18/2014   Pneumococcal  Polysaccharide-23 07/30/2004, 12/20/2012   Tdap 08/02/2013    TDAP status: Up to date  Flu Vaccine status: Up to date  Pneumococcal vaccine status: Up to date  Covid-19 vaccine status: Completed vaccines  Qualifies for Shingles Vaccine? Yes   Zostavax completed No   Shingrix Completed?: No.    Education has been provided regarding the importance of this vaccine. Patient has been advised to call insurance company to determine out of pocket expense if they have not yet received this vaccine. Advised may also receive vaccine at local pharmacy or Health Dept. Verbalized acceptance and understanding.  Screening Tests Health Maintenance  Topic Date Due   Diabetic kidney evaluation - Urine ACR  Never done   Zoster Vaccines- Shingrix (1 of 2) Never done   FOOT EXAM  07/01/2021   COVID-19 Vaccine (4 - 2023-24 season) 10/30/2021   OPHTHALMOLOGY EXAM  05/29/2022   INFLUENZA VACCINE  09/30/2022   HEMOGLOBIN A1C  10/24/2022   Diabetic kidney evaluation - eGFR measurement  03/09/2023   Medicare Annual Wellness (AWV)  07/02/2023   DTaP/Tdap/Td (2 - Td or Tdap) 08/03/2023   Pneumonia Vaccine 87+ Years old  Completed   DEXA SCAN  Completed   Hepatitis C Screening  Completed   HPV VACCINES  Aged Out   Fecal DNA (Cologuard)  Discontinued    Health Maintenance  Health Maintenance Due  Topic Date Due   Diabetic kidney evaluation - Urine ACR  Never done   Zoster Vaccines- Shingrix (1 of 2) Never done   FOOT EXAM  07/01/2021   COVID-19 Vaccine (4 - 2023-24 season) 10/30/2021   OPHTHALMOLOGY EXAM  05/29/2022    Colorectal cancer screening: No longer required.   Mammogram status: No longer required due to age.  Bone Density status: Completed 12/26/12. Results reflect: Bone density results: NORMAL. Repeat every 5 years.- referral declined  Lung Cancer Screening: (Low Dose CT Chest recommended if Age 14-80 years, 30 pack-year currently smoking OR have quit w/in 15years.) does not qualify.    Additional Screening:  Hepatitis C Screening: does qualify; Completed 12/20/16  Vision Screening: Recommended annual ophthalmology exams for early detection of glaucoma and other disorders of the eye. Is the patient up to date with their annual eye exam?  Yes  Who is the provider or what is the name of the office in which the patient attends annual eye exams? Dr.Brasington If pt is not established with a provider, would they like to be referred to a provider to establish care? No .   Dental Screening: Recommended annual dental exams for proper oral hygiene  [employer]  Referral / Chronic Care Management: CRR required this visit?  No   CCM required this visit?  No      Plan:     I have personally reviewed and noted the following in the patient's chart:   Medical and social history Use of alcohol, tobacco or illicit drugs  Current medications and supplements including opioid prescriptions. Patient is not currently taking opioid prescriptions. Functional ability and status Nutritional status Physical activity Advanced directives List of other physicians Hospitalizations, surgeries, and ER visits in previous 12 months Vitals Screenings to include cognitive, depression, and falls Referrals and appointments  In addition, I have reviewed and discussed with patient certain preventive protocols, quality metrics, and best practice recommendations. A written personalized care plan for preventive services as well as general preventive health recommendations were provided to patient.     Hal Hope, LPN   6/0/4540   Nurse Notes: none

## 2022-07-06 DIAGNOSIS — M25562 Pain in left knee: Secondary | ICD-10-CM | POA: Diagnosis not present

## 2022-07-06 DIAGNOSIS — M25662 Stiffness of left knee, not elsewhere classified: Secondary | ICD-10-CM | POA: Diagnosis not present

## 2022-07-06 DIAGNOSIS — M25661 Stiffness of right knee, not elsewhere classified: Secondary | ICD-10-CM | POA: Diagnosis not present

## 2022-07-06 DIAGNOSIS — R262 Difficulty in walking, not elsewhere classified: Secondary | ICD-10-CM | POA: Diagnosis not present

## 2022-07-08 DIAGNOSIS — M25662 Stiffness of left knee, not elsewhere classified: Secondary | ICD-10-CM | POA: Diagnosis not present

## 2022-07-08 DIAGNOSIS — M25661 Stiffness of right knee, not elsewhere classified: Secondary | ICD-10-CM | POA: Diagnosis not present

## 2022-07-08 DIAGNOSIS — M25562 Pain in left knee: Secondary | ICD-10-CM | POA: Diagnosis not present

## 2022-07-08 DIAGNOSIS — R262 Difficulty in walking, not elsewhere classified: Secondary | ICD-10-CM | POA: Diagnosis not present

## 2022-07-13 ENCOUNTER — Other Ambulatory Visit: Payer: Self-pay | Admitting: Cardiovascular Disease

## 2022-07-13 DIAGNOSIS — M25562 Pain in left knee: Secondary | ICD-10-CM | POA: Diagnosis not present

## 2022-07-13 DIAGNOSIS — R262 Difficulty in walking, not elsewhere classified: Secondary | ICD-10-CM | POA: Diagnosis not present

## 2022-07-13 DIAGNOSIS — M25661 Stiffness of right knee, not elsewhere classified: Secondary | ICD-10-CM | POA: Diagnosis not present

## 2022-07-13 DIAGNOSIS — M25662 Stiffness of left knee, not elsewhere classified: Secondary | ICD-10-CM | POA: Diagnosis not present

## 2022-07-16 DIAGNOSIS — H40003 Preglaucoma, unspecified, bilateral: Secondary | ICD-10-CM | POA: Diagnosis not present

## 2022-07-20 DIAGNOSIS — R262 Difficulty in walking, not elsewhere classified: Secondary | ICD-10-CM | POA: Diagnosis not present

## 2022-07-20 DIAGNOSIS — M25661 Stiffness of right knee, not elsewhere classified: Secondary | ICD-10-CM | POA: Diagnosis not present

## 2022-07-20 DIAGNOSIS — M25662 Stiffness of left knee, not elsewhere classified: Secondary | ICD-10-CM | POA: Diagnosis not present

## 2022-07-20 DIAGNOSIS — M25562 Pain in left knee: Secondary | ICD-10-CM | POA: Diagnosis not present

## 2022-07-23 DIAGNOSIS — E113293 Type 2 diabetes mellitus with mild nonproliferative diabetic retinopathy without macular edema, bilateral: Secondary | ICD-10-CM | POA: Diagnosis not present

## 2022-07-23 DIAGNOSIS — Z961 Presence of intraocular lens: Secondary | ICD-10-CM | POA: Diagnosis not present

## 2022-07-23 DIAGNOSIS — H40003 Preglaucoma, unspecified, bilateral: Secondary | ICD-10-CM | POA: Diagnosis not present

## 2022-07-23 DIAGNOSIS — M25562 Pain in left knee: Secondary | ICD-10-CM | POA: Diagnosis not present

## 2022-07-23 DIAGNOSIS — M25662 Stiffness of left knee, not elsewhere classified: Secondary | ICD-10-CM | POA: Diagnosis not present

## 2022-07-23 DIAGNOSIS — R262 Difficulty in walking, not elsewhere classified: Secondary | ICD-10-CM | POA: Diagnosis not present

## 2022-07-23 DIAGNOSIS — M25661 Stiffness of right knee, not elsewhere classified: Secondary | ICD-10-CM | POA: Diagnosis not present

## 2022-07-23 LAB — HM DIABETES EYE EXAM

## 2022-07-28 DIAGNOSIS — R262 Difficulty in walking, not elsewhere classified: Secondary | ICD-10-CM | POA: Diagnosis not present

## 2022-07-28 DIAGNOSIS — M25562 Pain in left knee: Secondary | ICD-10-CM | POA: Diagnosis not present

## 2022-07-28 DIAGNOSIS — M25662 Stiffness of left knee, not elsewhere classified: Secondary | ICD-10-CM | POA: Diagnosis not present

## 2022-07-28 DIAGNOSIS — M25661 Stiffness of right knee, not elsewhere classified: Secondary | ICD-10-CM | POA: Diagnosis not present

## 2022-07-30 ENCOUNTER — Encounter: Payer: Self-pay | Admitting: Family Medicine

## 2022-07-30 DIAGNOSIS — M25661 Stiffness of right knee, not elsewhere classified: Secondary | ICD-10-CM | POA: Diagnosis not present

## 2022-07-30 DIAGNOSIS — M25562 Pain in left knee: Secondary | ICD-10-CM | POA: Diagnosis not present

## 2022-07-30 DIAGNOSIS — M25662 Stiffness of left knee, not elsewhere classified: Secondary | ICD-10-CM | POA: Diagnosis not present

## 2022-07-30 DIAGNOSIS — R262 Difficulty in walking, not elsewhere classified: Secondary | ICD-10-CM | POA: Diagnosis not present

## 2022-08-02 ENCOUNTER — Ambulatory Visit: Payer: Medicare Other | Admitting: Family Medicine

## 2022-08-02 ENCOUNTER — Ambulatory Visit (INDEPENDENT_AMBULATORY_CARE_PROVIDER_SITE_OTHER): Payer: Medicare Other | Admitting: Family Medicine

## 2022-08-02 ENCOUNTER — Encounter: Payer: Self-pay | Admitting: Family Medicine

## 2022-08-02 ENCOUNTER — Other Ambulatory Visit: Payer: Self-pay | Admitting: Family Medicine

## 2022-08-02 VITALS — BP 128/58 | HR 62 | Ht 66.0 in | Wt 220.0 lb

## 2022-08-02 DIAGNOSIS — I700 Atherosclerosis of aorta: Secondary | ICD-10-CM

## 2022-08-02 DIAGNOSIS — Z0000 Encounter for general adult medical examination without abnormal findings: Secondary | ICD-10-CM

## 2022-08-02 DIAGNOSIS — E1121 Type 2 diabetes mellitus with diabetic nephropathy: Secondary | ICD-10-CM | POA: Diagnosis not present

## 2022-08-02 DIAGNOSIS — I82442 Acute embolism and thrombosis of left tibial vein: Secondary | ICD-10-CM

## 2022-08-02 DIAGNOSIS — I129 Hypertensive chronic kidney disease with stage 1 through stage 4 chronic kidney disease, or unspecified chronic kidney disease: Secondary | ICD-10-CM

## 2022-08-02 DIAGNOSIS — Z792 Long term (current) use of antibiotics: Secondary | ICD-10-CM

## 2022-08-02 DIAGNOSIS — E1169 Type 2 diabetes mellitus with other specified complication: Secondary | ICD-10-CM

## 2022-08-02 DIAGNOSIS — E039 Hypothyroidism, unspecified: Secondary | ICD-10-CM

## 2022-08-02 LAB — POCT GLYCOSYLATED HEMOGLOBIN (HGB A1C): Hemoglobin A1C: 7.6 % — AB (ref 4.0–5.6)

## 2022-08-02 MED ORDER — AMOXICILLIN 500 MG PO CAPS
ORAL_CAPSULE | ORAL | 2 refills | Status: DC
Start: 2022-08-02 — End: 2022-12-13

## 2022-08-02 NOTE — Assessment & Plan Note (Signed)
Completed 6 months of Eliquis for DVT, provoked by orthopedic procedure. No increased risk of DVT Discontinue Eliquis now, may take ASA 81mg  daily resumed now after finish Eliquis

## 2022-08-02 NOTE — Patient Instructions (Addendum)
Thank you for coming to the office today.  Recent Labs    01/27/22 1450 04/25/22 0000 08/02/22 1341  HGBA1C 6.8* 7.9 7.6*   Reduce Metformin XR 500mg  x 4 daily now down to  x 3 daily 500mg  x 3 = 1500mg   If this goes well, we can change to Metformin XR 750mg  x 2 = 1500mg  in future. Let me know before filling again  Discontinue / Complete Eliquis course, when finished last pill  ReStart Aspirin 81mg  daily  ----------------------------------------------------------------------  These offices have both PSYCHIATRY doctors and THERAPISTS  [facility], water quality Available) [place] [place] [address] [place], [place] [address] Phone: [phone]  [facility] Address: [address] [place], [place] [address] [email or web address] Phone: [phone]  [facility]) Address: [address], [place], [place] [address] Hours: 8:30AM-5PM Phone: [phone]  [facility] (Adult, Peds, Geriatric, Counseling) [address] [place], [place] [address] Phone: [phone] Fax: [phone]  [facility] at [facility] [address] [place], [place] [address] Phone: [phone]  [facility] (All [facility]) [address], [address] Kn[address] [place], [id number] Phone: [phone] (Option 1) [email or web address]  ----------------------------------------------------------------- THERAPIST ONLY  (No Psychiatry)  [facility] [address] [place], [place] [address] [place] P: [phone]  [facility]) [facility] Through Healing Therapy, [facility] [address] [place], [place] [address] [phone]  [facility]   Address: [address] [place], [place], [place] [address] Hours: Open today  9AM-7PM Phone: [phone]  [facility] [address] [facility]  -  [facility]ness Center Address: [address] [place], [place] [address] Phone: [phone]  [facility] [place], [place] [address] Phone: [phone]  DUE for FASTING BLOOD WORK (no food or drink after midnight before the lab appointment, only water or coffee without cream/sugar on the morning of)  SCHEDULE "Lab Only" visit in the morning at the clinic for lab draw in 4 MONTHS   - Make sure Lab Only appointment is at about 1 week before your next appointment, so that results will be available  For Lab Results, once available within 2-3 days of blood draw, you can can log in to MyChart online to view your results and a brief explanation. Also, we can discuss results at next follow-up visit.   Please schedule a Follow-up Appointment to: Return in about 4 months (around 12/02/2022) for 4 months fasting lab only then 1 week later Annual Physical.  If you have any other questions or concerns, please feel free to call the office or send a message through MyChart. You may also schedule an earlier appointment if necessary.  Additionally, you may be receiving a survey about your experience at our office within a few days to 1 week by e-mail or mail. We value your feedback.  Saralyn Pilar, DO [facility], [place]

## 2022-08-02 NOTE — Progress Notes (Signed)
Subjective:    Patient ID: [name], female    DOB: 25-Mar-1945, 77 y.o.   MRN: [id number]  [name] is a 77 y.o. female presenting on 08/02/2022 for Diabetes   HPI  CHRONIC DM, Type 2: A1c trend 7.3 > 6.6 > 7.9 > 7.6 Concern with diet less adherent lately with major stressors see below Meds: Tradjenta 5mg  daily, Metformin XR 500mg  x 4 = 2000mg  daily Previously on Ozempic but higher cost and issues with biliary problems Reports  good compliance. Tolerating well w/o side-effects Improving diet goals Denies hypoglycemia, polyuria, visual changes, numbness or tingling.  Acute DVT, provoked - Resolved Completed 6 month anticoagulation with Eliquis now in June 2024 No further complications related to DVT  Bereavement  Lost daughter in March 2024 Son in Social worker and daughters support system  Aortic Atherosclerosis On prior imaging 03/09/22 CT Abd She is on Statin therapy     08/02/2022    6:03 PM 07/02/2022   12:21 PM 04/30/2022   11:10 AM  Depression screen PHQ 2/9  Decreased Interest 1 2 1   Down, Depressed, Hopeless 1 2 1   PHQ - 2 Score 2 4 2   Altered sleeping 1 2 0  Tired, decreased energy 2 2 1   Change in appetite 0 0 0  Feeling bad or failure about yourself  0 0 0  Trouble concentrating 1 2 0  Moving slowly or fidgety/restless 0 0 0  Suicidal thoughts 0 0 0  PHQ-9 Score 6 10 3   Difficult doing work/chores Somewhat difficult Somewhat difficult Not difficult at all      04/30/2022   11:10 AM 06/11/2021    8:45 AM 01/02/2020    1:54 PM  GAD 7 : Generalized Anxiety Score  Nervous, Anxious, on Edge 0 0 1  Control/stop worrying 0 0 0  Worry too much - different things 0 0 0  Trouble relaxing 0 0 0  Restless 0 0 0  Easily annoyed or irritable 0 0 0  Afraid - awful might happen 0 0 0  Total GAD 7 Score 0 0 1  Anxiety Difficulty Not difficult at all Not difficult at all Not difficult at all     Current Outpatient Medications:    acetaminophen (TYLENOL) 650 MG  CR tablet, Take 1,950 mg by mouth daily., Disp: , Rfl:    amoxicillin (AMOXIL) 500 MG capsule, Take one dose 500mg  prior to dental procedure and take one dose 500mg  that evening after dental procedure, Disp: 2 capsule, Rfl: 2   aspirin EC 81 MG tablet, Take 81 mg by mouth daily. Swallow whole., Disp: , Rfl:    ezetimibe (ZETIA) 10 MG tablet, Take 1 tablet (10 mg total) by mouth daily., Disp: 90 tablet, Rfl: 3   FLUoxetine (PROZAC) 10 MG capsule, TAKE 1 CAPSULE BY MOUTH DAILY, Disp: 90 capsule, Rfl: 3   fluticasone (FLONASE) 50 MCG/ACT nasal spray, Place 2 sprays into both nostrils daily. (Patient taking differently: Place 2-3 sprays into both nostrils at bedtime as needed.), Disp: 16 g, Rfl: 0   furosemide (LASIX) 20 MG tablet, Take 1 tablet (20 mg total) by mouth daily as needed for edema or fluid., Disp: 90 tablet, Rfl: 0   Lancets (ONETOUCH ULTRASOFT) lancets, Use to check blood sugar daily, Disp: 100 each, Rfl: 3   levothyroxine (SYNTHROID) 125 MCG tablet, TAKE 1 TABLET BY MOUTH  DAILY BEFORE BREAKFAST, Disp: 90 tablet, Rfl: 3   metFORMIN (GLUCOPHAGE-XR) 500 MG 24 hr tablet, Take 1,500  mg by mouth daily with breakfast. Dose reduction, Disp: 360 tablet, Rfl: 1   metoprolol succinate (TOPROL XL) 25 MG 24 hr tablet, Take 0.5 tablets (12.5 mg total) by mouth at bedtime., Disp: 7 tablet, Rfl: 0   nitroGLYCERIN (NITROSTAT) 0.4 MG SL tablet, Place 1 tablet (0.4 mg total) under the tongue every 5 (five) minutes as needed for chest pain., Disp: 30 tablet, Rfl: 11   ONETOUCH ULTRA test strip, Use to check blood sugar daily, Disp: 100 each, Rfl: 3   rosuvastatin (CRESTOR) 20 MG tablet, Take 1 tablet (20 mg total) by mouth daily., Disp: 90 tablet, Rfl: 3   TRADJENTA 5 MG TABS tablet, Take 1 tablet (5 mg total) by mouth daily., Disp: 90 tablet, Rfl: 1   traMADol (ULTRAM) 50 MG tablet, Take 50 mg by mouth every 6 (six) hours as needed., Disp: , Rfl:    Social History   Tobacco Use   Smoking status: Never    Smokeless tobacco: Never  Vaping Use   Vaping Use: Never used  Substance Use Topics   Alcohol use: No   Drug use: No    Review of Systems Per HPI unless specifically indicated above     Objective:    BP (!) 128/58   Pulse 62   Ht 5\' 6"  (1.676 m)   Wt 220 lb (99.8 kg)   SpO2 99%   BMI 35.51 kg/m   Wt Readings from Last 3 Encounters:  08/02/22 220 lb (99.8 kg)  07/02/22 217 lb (98.4 kg)  06/08/22 217 lb 3.2 oz (98.5 kg)    Physical Exam Vitals and nursing note reviewed.  Constitutional:      General: She is not in acute distress.    Appearance: Normal appearance. She is well-developed. She is not diaphoretic.     Comments: Well-appearing, comfortable, cooperative  HENT:     Head: Normocephalic and atraumatic.  Eyes:     General:        Right eye: No discharge.        Left eye: No discharge.     Conjunctiva/sclera: Conjunctivae normal.  Cardiovascular:     Rate and Rhythm: Normal rate.  Pulmonary:     Effort: Pulmonary effort is normal.  Skin:    General: Skin is warm and dry.     Findings: No erythema or rash.  Neurological:     Mental Status: She is alert and oriented to person, place, and time.  Psychiatric:        Mood and Affect: Mood normal.        Behavior: Behavior normal.        Thought Content: Thought content normal.     Comments: Well groomed, good eye contact, normal speech and thoughts    I have personally reviewed the radiology report from 03/09/22 on CT Abd Pelvis.  CLINICAL DATA:  Right-sided abdominal pain. Gallbladder drain placed before Christmas and then replaced 02/24/2022. Drainage from incision site. New fever.   EXAM: CT ABDOMEN AND PELVIS WITH CONTRAST   TECHNIQUE: Multidetector CT imaging of the abdomen and pelvis was performed using the standard protocol following bolus administration of intravenous contrast.   RADIATION DOSE REDUCTION: This exam was performed according to the departmental dose-optimization program which  includes automated exposure control, adjustment of the mA and/or kV according to patient size and/or use of iterative reconstruction technique.   CONTRAST:  OMNIPAQUE IOHEXOL 350 MG/ML SOLN   COMPARISON:  CT 01/30/2022   FINDINGS: Lower chest:  Bibasilar atelectasis/consolidation. Coronary artery calcification. No acute abnormality.   Hepatobiliary: Percutaneous cholecystostomy tube within the gallbladder. Mild edema along the tract without fluid collection or abscess.   The gallbladder is distended. Gallstone in the gallbladder neck. Gallbladder wall thickening and hyperemia. Small amount of pericholecystic fluid. The common bile duct is dilated measuring 1.1 cm in diameter. Mild intrahepatic biliary dilation. No focal hepatic lesion. Redemonstrated poor enhancement of the left portal vein compatible with known portal vein thrombosis. The main and right portal vein are patent.   Pancreas: Fatty atrophy of the pancreas.   Spleen: Unremarkable.   Adrenals/Urinary Tract: Normal adrenal glands. Multiple low-attenuation lesions in both kidneys are too small to definitively characterize but statistically likely to represent cysts. No specific follow-up recommended. No urinary calculi or hydronephrosis. Unremarkable bladder.   Stomach/Bowel: Normal caliber large and small bowel. Large colonic stool load. Normal appendix.   Vascular/Lymphatic: Aortic atherosclerosis. No enlarged abdominal or pelvic lymph nodes.   Reproductive: Hysterectomy.   Other: Peripherally enhancing mildly thick-walled fluid collection in the anterior right pelvis measures 7.3 x 6.4 x 5.9 cm. This is below the inferior aspect of the scan on 01/30/2022 and was not present on 01/27/2022. Adjacent edema/stranding. Small amount of free fluid in the pelvis and right pericolic gutter. No free intraperitoneal air.   Musculoskeletal: No acute osseous abnormality diffuse body wall edema.    IMPRESSION: Cholecystostomy tube with pigtail portion in the gallbladder fundus. The gallbladder is distended with large stone in the gallbladder neck. There is wall thickening and pericholecystic fluid slightly increased from prior. Clinical correlation recommended to ensure the drain is properly functioning.   Peripherally enhancing mildly thick-walled 7.3 cm fluid collection in the anterior right pelvis concerning for abscess. This is below the inferior aspect of the scan on 01/30/2022.   Large colonic stool burden.  Correlate for constipation.   Redemonstrated chronic left portal vein thrombus.   Aortic Atherosclerosis (ICD10-I70.0).   Body wall anasarca.     Electronically Signed   By: Minerva Fester M.D.   On: 03/09/2022 03:02    Protein, Total, Random Urine w/Creatinine (Protein/Creat Ratio) Order: [id number] Component Ref Range & Units 1 mo ago  Creatinine, Ur 20 - 275 mg/dL 79  Urine Protein/Creatinine Ratio 24 - 184 mg/g creat 152  Protein/Creatinine Ratio, Urine 0.024 - 0.184 mg/mg creat 0.152  Protein Urine Random 5 - 24 mg/dL 12     Results for orders placed or performed in visit on 08/02/22  Protein / creatinine ratio, urine  Result Value Ref Range   Creatinine, Urine 79    Albumin, U 12   POCT HgB A1C  Result Value Ref Range   Hemoglobin A1C 7.6 (A) 4.0 - 5.6 %      Assessment & Plan:   Problem List Items Addressed This Visit     Atherosclerosis of aorta (HCC)    Stable on prior imaging CT 03/09/22 On Statin and Zetia      Relevant Medications   aspirin EC 81 MG tablet   Controlled type 2 diabetes mellitus with diabetic nephropathy (HCC) - Primary    Improved A1c to 7.6 Complications - CKD-III Hyperlipidemia, Hypothyroidism Prior meds: Januvia, insulin  Plan:  Remain OFF Ozempic for now due to biliary concern Continue Tradjenta 5mg  daily Reduce Metformin from XR 500mg  x 4 = 2000mg  daily to x 3 = 1500 daily, can reduce to XR 750 x 2  in future  2. Encourage improved lifestyle - low carb, low sugar  diet, reduce portion size, continue improving regular exercise 3. Check CBG, bring log to next visit for review 4. Continue ASA, ARB, Statin      Relevant Medications   aspirin EC 81 MG tablet   Other Relevant Orders   POCT HgB A1C (Completed)   RESOLVED: DVT (deep venous thrombosis) (HCC)    Completed 6 months of Eliquis for DVT, provoked by orthopedic procedure. No increased risk of DVT Discontinue Eliquis now, may take ASA 81mg  daily resumed now after finish Eliquis      Relevant Medications   aspirin EC 81 MG tablet   Other Visit Diagnoses     Need for antibiotic prophylaxis for dental procedure       Relevant Medications   amoxicillin (AMOXIL) 500 MG capsule       Meds ordered this encounter  Medications   amoxicillin (AMOXIL) 500 MG capsule    Sig: Take one dose 500mg  prior to dental procedure and take one dose 500mg  that evening after dental procedure    Dispense:  2 capsule    Refill:  2     Follow up plan: Return in about 4 months (around 12/02/2022) for 4 months fasting lab only then 1 week later Annual Physical.  Future labs ordered for 12/06/22   Saralyn Pilar, DO [facility] 08/02/2022, 1:30 PM

## 2022-08-02 NOTE — Assessment & Plan Note (Signed)
Improved A1c to 7.6 Complications - CKD-III Hyperlipidemia, Hypothyroidism Prior meds: Januvia, insulin  Plan:  Remain OFF Ozempic for now due to biliary concern Continue Tradjenta 5mg  daily Reduce Metformin from XR 500mg  x 4 = 2000mg  daily to x 3 = 1500 daily, can reduce to XR 750 x 2 in future  2. Encourage improved lifestyle - low carb, low sugar diet, reduce portion size, continue improving regular exercise 3. Check CBG, bring log to next visit for review 4. Continue ASA, ARB, Statin

## 2022-08-03 ENCOUNTER — Ambulatory Visit: Payer: Medicare Other | Admitting: Cardiovascular Disease

## 2022-08-03 DIAGNOSIS — M25662 Stiffness of left knee, not elsewhere classified: Secondary | ICD-10-CM | POA: Diagnosis not present

## 2022-08-03 DIAGNOSIS — I700 Atherosclerosis of aorta: Secondary | ICD-10-CM | POA: Insufficient documentation

## 2022-08-03 DIAGNOSIS — M25562 Pain in left knee: Secondary | ICD-10-CM | POA: Diagnosis not present

## 2022-08-03 DIAGNOSIS — M25661 Stiffness of right knee, not elsewhere classified: Secondary | ICD-10-CM | POA: Diagnosis not present

## 2022-08-03 DIAGNOSIS — R262 Difficulty in walking, not elsewhere classified: Secondary | ICD-10-CM | POA: Diagnosis not present

## 2022-08-03 NOTE — Assessment & Plan Note (Signed)
Stable on prior imaging CT 03/09/22 On Statin and Zetia

## 2022-08-06 DIAGNOSIS — R262 Difficulty in walking, not elsewhere classified: Secondary | ICD-10-CM | POA: Diagnosis not present

## 2022-08-06 DIAGNOSIS — M25662 Stiffness of left knee, not elsewhere classified: Secondary | ICD-10-CM | POA: Diagnosis not present

## 2022-08-06 DIAGNOSIS — M25661 Stiffness of right knee, not elsewhere classified: Secondary | ICD-10-CM | POA: Diagnosis not present

## 2022-08-06 DIAGNOSIS — M25562 Pain in left knee: Secondary | ICD-10-CM | POA: Diagnosis not present

## 2022-08-09 ENCOUNTER — Other Ambulatory Visit: Payer: Self-pay | Admitting: Cardiovascular Disease

## 2022-08-09 DIAGNOSIS — R600 Localized edema: Secondary | ICD-10-CM

## 2022-08-12 ENCOUNTER — Other Ambulatory Visit: Payer: Self-pay | Admitting: Family Medicine

## 2022-08-12 DIAGNOSIS — E1121 Type 2 diabetes mellitus with diabetic nephropathy: Secondary | ICD-10-CM

## 2022-08-12 DIAGNOSIS — I82442 Acute embolism and thrombosis of left tibial vein: Secondary | ICD-10-CM

## 2022-08-12 NOTE — Telephone Encounter (Signed)
Requested Prescriptions  Pending Prescriptions Disp Refills   ELIQUIS 5 MG TABS tablet [Pharmacy Med Name: Eliquis 5 MG Oral Tablet] 180 tablet 3    Sig: TAKE 1 TABLET BY MOUTH TWICE  DAILY     Hematology:  Anticoagulants - apixaban Failed - 08/12/2022  4:25 AM      Failed - HGB in normal range and within 360 days    Hemoglobin  Date Value Ref Range Status  03/08/2022 9.9 (L) 12.0 - 15.0 g/dL Final   HGB  Date Value Ref Range Status  02/14/2013 11.6 (L) 12.0 - 16.0 g/dL Final         Failed - HCT in normal range and within 360 days    HCT  Date Value Ref Range Status  03/08/2022 33.0 (L) 36.0 - 46.0 % Final  02/06/2013 35.9 35.0 - 47.0 % Final         Passed - PLT in normal range and within 360 days    Platelets  Date Value Ref Range Status  03/08/2022 386 150 - 400 K/uL Final   Platelet  Date Value Ref Range Status  02/06/2013 232 150 - 440 x10 3/mm 3 Final         Passed - Cr in normal range and within 360 days    Creat  Date Value Ref Range Status  06/24/2020 0.96 (H) 0.60 - 0.93 mg/dL Final    Comment:    For patients >19 years of age, the reference limit for Creatinine is approximately 13% higher for people identified as African-American. .    Creatinine, Ser  Date Value Ref Range Status  03/08/2022 0.80 0.44 - 1.00 mg/dL Final   Creatinine, Urine  Date Value Ref Range Status  06/10/2022 79  Final         Passed - AST in normal range and within 360 days    AST  Date Value Ref Range Status  03/08/2022 40 15 - 41 U/L Final         Passed - ALT in normal range and within 360 days    ALT  Date Value Ref Range Status  03/08/2022 30 0 - 44 U/L Final         Passed - Valid encounter within last 12 months    Recent Outpatient Visits           1 week ago Controlled type 2 diabetes mellitus with diabetic nephropathy, without long-term current use of insulin [facility])   [place] [facility] [place], Netta Neat, DO   3 months ago  Controlled type 2 diabetes mellitus with diabetic nephropathy, without long-term current use of insulin [facility])   [place] [facility] [place], Netta Neat, DO   6 months ago Acute cholecystitis   [place] [facility] [place], Netta Neat, DO   1 year ago Controlled type 2 diabetes mellitus with diabetic nephropathy, without long-term current use of insulin [facility])   [place] [facility] [place], Netta Neat, DO   2 years ago Controlled type 2 diabetes mellitus with diabetic nephropathy, without long-term current use of insulin (HCC)   [place] [facility] [place], Netta Neat, DO       Future Appointments             In 4 months Althea Charon, Netta Neat, DO [place] [facility], [facility]             [facility]  5 MG TABS tablet [Pharmacy Med Name: Tradjenta 5 MG Oral Tablet] 90 tablet 3    Sig: TAKE 1 TABLET BY MOUTH DAILY     Endocrinology:  Diabetes - DPP-4 Inhibitors - linagliptin Passed - 08/12/2022  4:25 AM      Passed - HBA1C is between 0 and 7.9 and within 180 days    Hemoglobin A1C  Date Value Ref Range Status  08/02/2022 7.6 (A) 4.0 - 5.6 % Final  04/25/2022 7.9  Final         Passed - Valid encounter within last 6 months    Recent Outpatient Visits           1 week ago Controlled type 2 diabetes mellitus with diabetic nephropathy, without long-term current use of insulin [facility])   [place] [facility] [place], Netta Neat, DO   3 months ago Controlled type 2 diabetes mellitus with diabetic nephropathy, without long-term current use of insulin [facility])   [place] [place], Netta Neat, DO   6 months ago Acute cholecystitis   [place] [facility] [place], Netta Neat, DO   1 year ago Controlled type 2 diabetes mellitus with diabetic nephropathy, without long-term current use of insulin [facility])    [place] [facility] [place], Netta Neat, DO   2 years ago Controlled type 2 diabetes mellitus with diabetic nephropathy, without long-term current use of insulin (HCC)   [place] [facility] [place], Netta Neat, DO       Future Appointments             In 4 months Althea Charon, Netta Neat, DO [place] [facility], [facility]

## 2022-08-16 ENCOUNTER — Other Ambulatory Visit: Payer: Self-pay | Admitting: Cardiovascular Disease

## 2022-08-16 ENCOUNTER — Other Ambulatory Visit: Payer: Self-pay | Admitting: Family Medicine

## 2022-08-16 DIAGNOSIS — E890 Postprocedural hypothyroidism: Secondary | ICD-10-CM

## 2022-08-17 ENCOUNTER — Encounter: Payer: Self-pay | Admitting: Family Medicine

## 2022-08-17 DIAGNOSIS — E1121 Type 2 diabetes mellitus with diabetic nephropathy: Secondary | ICD-10-CM

## 2022-08-17 DIAGNOSIS — I129 Hypertensive chronic kidney disease with stage 1 through stage 4 chronic kidney disease, or unspecified chronic kidney disease: Secondary | ICD-10-CM

## 2022-08-17 MED ORDER — METFORMIN HCL ER 750 MG PO TB24
1500.0000 mg | ORAL_TABLET | Freq: Every day | ORAL | 3 refills | Status: DC
Start: 2022-08-17 — End: 2023-04-27

## 2022-08-17 MED ORDER — LOSARTAN POTASSIUM 100 MG PO TABS
100.0000 mg | ORAL_TABLET | Freq: Every day | ORAL | 3 refills | Status: DC
Start: 2022-08-17 — End: 2023-04-27

## 2022-08-17 NOTE — Telephone Encounter (Signed)
Unable to refill per protocol, Rx request is too soon. Last refill 09/29/21 for 90 and 3 refills.  Requested Prescriptions  Pending Prescriptions Disp Refills   levothyroxine (SYNTHROID) 125 MCG tablet [Pharmacy Med Name: MYLAN-LEVOTHYROX TABLET] 90 tablet 3    Sig: TAKE 1 TABLET BY MOUTH DAILY  BEFORE BREAKFAST     Endocrinology:  Hypothyroid Agents Passed - 08/16/2022  6:21 PM      Passed - TSH in normal range and within 360 days    TSH  Date Value Ref Range Status  10/01/2021 1.73 0.40 - 4.50 mIU/L Final         Passed - Valid encounter within last 12 months    Recent Outpatient Visits           2 weeks ago Controlled type 2 diabetes mellitus with diabetic nephropathy, without long-term current use of insulin (HCC)   [place] [facility] [place], Netta Neat, DO   3 months ago Controlled type 2 diabetes mellitus with diabetic nephropathy, without long-term current use of insulin [facility])   [place] [facility] [place] C[place], DO   6 months ago Acute cholecystitis   [facility] [place], Netta Neat, DO   1 year ago Controlled type 2 diabetes mellitus with diabetic nephropathy, without long-term current use of insulin [facility])   [place] [facility] [place], Netta Neat, DO   2 years ago Controlled type 2 diabetes mellitus with diabetic nephropathy, without long-term current use of insulin (HCC)   [place] [facility] [place], Netta Neat, DO       Future Appointments             In 3 months Althea Charon, Netta Neat, DO [place] [facility], [facility]

## 2022-09-13 ENCOUNTER — Other Ambulatory Visit: Payer: Self-pay | Admitting: Family Medicine

## 2022-09-13 ENCOUNTER — Other Ambulatory Visit: Payer: Self-pay

## 2022-09-13 ENCOUNTER — Other Ambulatory Visit (HOSPITAL_COMMUNITY): Payer: Self-pay

## 2022-09-13 DIAGNOSIS — I129 Hypertensive chronic kidney disease with stage 1 through stage 4 chronic kidney disease, or unspecified chronic kidney disease: Secondary | ICD-10-CM

## 2022-09-13 MED ORDER — METOPROLOL SUCCINATE ER 25 MG PO TB24
12.5000 mg | ORAL_TABLET | Freq: Every day | ORAL | 0 refills | Status: DC
Start: 2022-09-13 — End: 2022-09-20
  Filled 2022-09-13: qty 7, 14d supply, fill #0

## 2022-09-18 ENCOUNTER — Encounter: Payer: Self-pay | Admitting: Family Medicine

## 2022-09-18 DIAGNOSIS — N183 Chronic kidney disease, stage 3 unspecified: Secondary | ICD-10-CM

## 2022-09-20 MED ORDER — METOPROLOL SUCCINATE ER 25 MG PO TB24
12.5000 mg | ORAL_TABLET | Freq: Every day | ORAL | 0 refills | Status: AC
Start: 2022-09-20 — End: ?

## 2022-10-26 ENCOUNTER — Telehealth: Payer: Self-pay | Admitting: Family Medicine

## 2022-10-26 ENCOUNTER — Encounter: Payer: Self-pay | Admitting: Family Medicine

## 2022-10-26 DIAGNOSIS — E890 Postprocedural hypothyroidism: Secondary | ICD-10-CM

## 2022-10-26 MED ORDER — LEVOTHYROXINE SODIUM 125 MCG PO TABS: ORAL_TABLET | ORAL | 1 refills | Status: DC

## 2022-10-26 MED ORDER — LEVOTHYROXINE SODIUM 125 MCG PO TABS
ORAL_TABLET | ORAL | 1 refills | Status: DC
Start: 2022-10-26 — End: 2022-10-26

## 2022-10-26 NOTE — Telephone Encounter (Signed)
Please review.  KP

## 2022-10-26 NOTE — Telephone Encounter (Signed)
Tobi Bastos from [facility] called to get approval to fill medication as they are changing manufacturers for levothyroxine (SYNTHROID) 125 MCG tablet from [employer] to [employer]. Please f/u with Ann at [phone] and [phone].

## 2022-10-26 NOTE — Telephone Encounter (Signed)
Tobi Bastos from [employer] requesting approval to change manufacturers for Levothyroxine 125 mcg. Routing for approval.

## 2022-12-01 ENCOUNTER — Other Ambulatory Visit: Payer: Self-pay | Admitting: Family Medicine

## 2022-12-01 DIAGNOSIS — I129 Hypertensive chronic kidney disease with stage 1 through stage 4 chronic kidney disease, or unspecified chronic kidney disease: Secondary | ICD-10-CM

## 2022-12-03 ENCOUNTER — Other Ambulatory Visit: Payer: Self-pay

## 2022-12-03 DIAGNOSIS — N183 Chronic kidney disease, stage 3 unspecified: Secondary | ICD-10-CM

## 2022-12-03 DIAGNOSIS — Z0000 Encounter for general adult medical examination without abnormal findings: Secondary | ICD-10-CM

## 2022-12-03 DIAGNOSIS — E1121 Type 2 diabetes mellitus with diabetic nephropathy: Secondary | ICD-10-CM

## 2022-12-03 DIAGNOSIS — E1169 Type 2 diabetes mellitus with other specified complication: Secondary | ICD-10-CM

## 2022-12-03 DIAGNOSIS — I129 Hypertensive chronic kidney disease with stage 1 through stage 4 chronic kidney disease, or unspecified chronic kidney disease: Secondary | ICD-10-CM

## 2022-12-03 DIAGNOSIS — E039 Hypothyroidism, unspecified: Secondary | ICD-10-CM

## 2022-12-06 ENCOUNTER — Other Ambulatory Visit: Payer: Medicare Other

## 2022-12-06 DIAGNOSIS — E1169 Type 2 diabetes mellitus with other specified complication: Secondary | ICD-10-CM | POA: Diagnosis not present

## 2022-12-06 DIAGNOSIS — E039 Hypothyroidism, unspecified: Secondary | ICD-10-CM | POA: Diagnosis not present

## 2022-12-06 DIAGNOSIS — E785 Hyperlipidemia, unspecified: Secondary | ICD-10-CM | POA: Diagnosis not present

## 2022-12-06 DIAGNOSIS — I129 Hypertensive chronic kidney disease with stage 1 through stage 4 chronic kidney disease, or unspecified chronic kidney disease: Secondary | ICD-10-CM | POA: Diagnosis not present

## 2022-12-06 DIAGNOSIS — E1121 Type 2 diabetes mellitus with diabetic nephropathy: Secondary | ICD-10-CM | POA: Diagnosis not present

## 2022-12-07 LAB — COMPLETE METABOLIC PANEL WITH GFR
AG Ratio: 1.2 (calc) (ref 1.0–2.5)
ALT: 12 U/L (ref 6–29)
AST: 15 U/L (ref 10–35)
Albumin: 3.8 g/dL (ref 3.6–5.1)
Alkaline phosphatase (APISO): 81 U/L (ref 37–153)
BUN/Creatinine Ratio: 25 (calc) — ABNORMAL HIGH (ref 6–22)
BUN: 25 mg/dL (ref 7–25)
CO2: 26 mmol/L (ref 20–32)
Calcium: 8.6 mg/dL (ref 8.6–10.4)
Chloride: 104 mmol/L (ref 98–110)
Creat: 1.01 mg/dL — ABNORMAL HIGH (ref 0.60–1.00)
Globulin: 3.1 g/dL (ref 1.9–3.7)
Glucose, Bld: 169 mg/dL — ABNORMAL HIGH (ref 65–99)
Potassium: 4.7 mmol/L (ref 3.5–5.3)
Sodium: 139 mmol/L (ref 135–146)
Total Bilirubin: 0.5 mg/dL (ref 0.2–1.2)
Total Protein: 6.9 g/dL (ref 6.1–8.1)
eGFR: 57 mL/min/{1.73_m2} — ABNORMAL LOW (ref >=60)

## 2022-12-07 LAB — CBC WITH DIFFERENTIAL/PLATELET
Absolute Monocytes: 494 {cells}/uL (ref 200–950)
Basophils Absolute: 31 {cells}/uL (ref 0–200)
Basophils Relative: 0.6 %
Eosinophils Absolute: 302 {cells}/uL (ref 15–500)
Eosinophils Relative: 5.8 %
HCT: 37.4 % (ref 35.0–45.0)
Hemoglobin: 11.5 g/dL — ABNORMAL LOW (ref 11.7–15.5)
Lymphs Abs: 1352 {cells}/uL (ref 850–3900)
MCH: 29.9 pg (ref 27.0–33.0)
MCHC: 30.7 g/dL — ABNORMAL LOW (ref 32.0–36.0)
MCV: 97.1 fL (ref 80.0–100.0)
MPV: 10.1 fL (ref 7.5–12.5)
Monocytes Relative: 9.5 %
Neutro Abs: 3021 {cells}/uL (ref 1500–7800)
Neutrophils Relative %: 58.1 %
Platelets: 201 10*3/uL (ref 140–400)
RBC: 3.85 10*6/uL (ref 3.80–5.10)
RDW: 11.4 % (ref 11.0–15.0)
Total Lymphocyte: 26 %
WBC: 5.2 10*3/uL (ref 3.8–10.8)

## 2022-12-07 LAB — HEMOGLOBIN A1C
Hgb A1c MFr Bld: 9.2 %{Hb} — ABNORMAL HIGH (ref <5.7)
Mean Plasma Glucose: 217 mg/dL
eAG (mmol/L): 12 mmol/L

## 2022-12-07 LAB — LIPID PANEL
Cholesterol: 112 mg/dL (ref <200)
HDL: 62 mg/dL (ref >=50)
LDL Cholesterol (Calc): 32 mg/dL
Non-HDL Cholesterol (Calc): 50 mg/dL (ref <130)
Total CHOL/HDL Ratio: 1.8 (calc) (ref <5.0)
Triglycerides: 94 mg/dL (ref <150)

## 2022-12-07 LAB — T4, FREE: Free T4: 1.1 ng/dL (ref 0.8–1.8)

## 2022-12-07 LAB — TSH: TSH: 4.89 m[IU]/L — ABNORMAL HIGH (ref 0.40–4.50)

## 2022-12-13 ENCOUNTER — Ambulatory Visit (INDEPENDENT_AMBULATORY_CARE_PROVIDER_SITE_OTHER): Payer: Medicare Other | Admitting: Family Medicine

## 2022-12-13 ENCOUNTER — Ambulatory Visit: Payer: Medicare Other

## 2022-12-13 ENCOUNTER — Encounter: Payer: Self-pay | Admitting: Family Medicine

## 2022-12-13 VITALS — BP 120/72 | HR 51 | Ht 66.0 in | Wt 238.6 lb

## 2022-12-13 DIAGNOSIS — E1169 Type 2 diabetes mellitus with other specified complication: Secondary | ICD-10-CM | POA: Diagnosis not present

## 2022-12-13 DIAGNOSIS — E039 Hypothyroidism, unspecified: Secondary | ICD-10-CM | POA: Diagnosis not present

## 2022-12-13 DIAGNOSIS — M150 Primary generalized (osteo)arthritis: Secondary | ICD-10-CM

## 2022-12-13 DIAGNOSIS — E1121 Type 2 diabetes mellitus with diabetic nephropathy: Secondary | ICD-10-CM | POA: Diagnosis not present

## 2022-12-13 DIAGNOSIS — Z0000 Encounter for general adult medical examination without abnormal findings: Secondary | ICD-10-CM | POA: Diagnosis not present

## 2022-12-13 DIAGNOSIS — I700 Atherosclerosis of aorta: Secondary | ICD-10-CM | POA: Diagnosis not present

## 2022-12-13 DIAGNOSIS — Z789 Other specified health status: Secondary | ICD-10-CM | POA: Diagnosis not present

## 2022-12-13 DIAGNOSIS — I129 Hypertensive chronic kidney disease with stage 1 through stage 4 chronic kidney disease, or unspecified chronic kidney disease: Secondary | ICD-10-CM | POA: Diagnosis not present

## 2022-12-13 DIAGNOSIS — Z23 Encounter for immunization: Secondary | ICD-10-CM | POA: Diagnosis not present

## 2022-12-13 DIAGNOSIS — Z719 Counseling, unspecified: Secondary | ICD-10-CM

## 2022-12-13 DIAGNOSIS — E785 Hyperlipidemia, unspecified: Secondary | ICD-10-CM | POA: Diagnosis not present

## 2022-12-13 DIAGNOSIS — N183 Chronic kidney disease, stage 3 unspecified: Secondary | ICD-10-CM | POA: Diagnosis not present

## 2022-12-13 DIAGNOSIS — Z111 Encounter for screening for respiratory tuberculosis: Secondary | ICD-10-CM

## 2022-12-13 MED ORDER — TRAMADOL HCL 50 MG PO TABS: 50.0000 mg | ORAL_TABLET | Freq: Four times a day (QID) | ORAL | 0 refills | Status: DC | PRN

## 2022-12-13 MED ORDER — MOUNJARO 2.5 MG/0.5ML ~~LOC~~ SOAJ
2.5000 mg | SUBCUTANEOUS | 0 refills | Status: DC
Start: 2022-12-13 — End: 2023-07-13

## 2022-12-13 NOTE — Patient Instructions (Addendum)
Thank you for coming to the office today.  Recent Labs    04/25/22 0000 08/02/22 1341 12/06/22 0907  HGBA1C 7.9 7.6* 9.2*    New order Mounjaro 2.5mg  weekly for 30 day trial, let me know before you run out if it works well and if you prefer a dose increase to 5mg , if you need one month locally or 3 months to mail order.  If / when starting the [facility], STOP Tradjenta  Keep Metformin.  Labs today  MMR Titer Varicella Titer And TB Quantiferon screening.  Cholesterol is well controlled on your Zetia + Rosuvastatin.  Refilled Tramadol as needed.  Please schedule a Follow-up Appointment to: Return in about 6 months (around 06/13/2023) for 6 month DM A1c.  If you have any other questions or concerns, please feel free to call the office or send a message through MyChart. You may also schedule an earlier appointment if necessary.  Additionally, you may be receiving a survey about your experience at our office within a few days to 1 week by e-mail or mail. We value your feedback.  Saralyn Pilar, DO [facility], [place]

## 2022-12-13 NOTE — Assessment & Plan Note (Signed)
Stable on prior imaging CT 03/09/22 On Statin and Zetia

## 2022-12-13 NOTE — Assessment & Plan Note (Addendum)
Well-controlled HTN Complication with CKD-III Off Amlodipine   Plan:  1. Continue current BP regimen Losartan 100mg  daily, Metoprolol XL 12.5mg  daily 2. Encourage improved lifestyle - low sodium diet, regular exercise 3. Continue monitor BP outside office, bring readings to next visit, if persistently >140/90 or new symptoms notify office sooner

## 2022-12-13 NOTE — Assessment & Plan Note (Signed)
Controlled cholesterol, improved TG on statin and zetia and diet  LDL goal < 70 per Cards ASCVD risk score is elevated due to known DM, CAD  The ASCVD Risk score (Arnett DK, et al., 2019) failed to calculate for the following reasons:   The patient has a prior MI or stroke diagnosis   Plan: 1. Continue current meds - Rosuvastatin 20mg  daily + Zetia per Cards - Reconsider [id number] in future 2. Continue ASA 81mg  for secondary ASCVD risk reduction 3. Encourage improved lifestyle - low carb/cholesterol, reduce portion size, continue improving exercise 4. Follow-up yearly w/ lipid

## 2022-12-13 NOTE — Assessment & Plan Note (Signed)
A1c up to 9.2 limited control on Tradjenta and Metformin Complications - CKD-III Hyperlipidemia, Hypothyroidism Prior meds: Januvia, insulin Off Ozempic due to biliary Now s/p cholecystectomy  Plan:  Trial on GLP GIP therapy Mounjaro 2.5mg  weekly inj dose adjust monthly up to 5mg  and on Discontinue Tradjenta Continue Metformin XR 750mg  x 2 = 1500 daily  2. Encourage improved lifestyle - low carb, low sugar diet, reduce portion size, continue improving regular exercise 3. Check CBG, bring log to next visit for review 4. Continue ASA, ARB, Statin

## 2022-12-13 NOTE — Progress Notes (Signed)
In nurse clinic for immunizations: Flu and Covid/ VIS provided. Tolerated vaccines well. NCIR updated and copy given to pt. No adverse reaction noted post .

## 2022-12-13 NOTE — Progress Notes (Signed)
Subjective:    Patient ID: [name], female    DOB: Aug 05, 1945, 77 y.o.   MRN: [id number]  [name] is a 77 y.o. female presenting on 12/13/2022 for Annual Exam   HPI  Here for Annual Physical and Lab Review  Discussed the use of AI scribe software for clinical note transcription with the patient, who gave verbal consent to proceed.     The patient, with a history of diabetes, hyperlipidemia, and hypothyroidism, presents for an annual check-up.     The patient is due for a flu and COVID vaccination, which she plans to receive later in the day. She also expresses a desire to volunteer at the [place], which requires proof of immunity to MMR, varicella, and tuberculosis. The patient recalls having measles in the past and believes her immunity should be sufficient. She also mentions having had chickenpox, suggesting she should be immune to varicella.  The patient reports a history of gallbladder removal and subsequent infection, which led to discontinuation of Ozempic due to digestive issues. She expresses a willingness to try a new medication, Mounjaro, to manage her diabetes. She also mentions a slight weight gain, which she attributes to stress related to personal issues.  The patient is currently on a regimen of Zetia and rosuvastatin for hyperlipidemia, which she reports has been effective in controlling her LDL levels. She also takes levothyroxine for hypothyroidism, and her recent lab results indicate stable thyroid function.  The patient occasionally uses tramadol for joint pain, particularly before participating in activities such as bowling. She reports having only a few tablets left and requests a refill. She also mentions having received an RSV shot about ten years ago when her grandchild was born.  The patient's foot check reveals intact sensation, and she reports no specific issues with her feet. She also mentions having had shingles in the past, which she believes  was stress-related. She expresses uncertainty about whether she should receive the shingles vaccine.        CHRONIC DM, Type 2: A1c elevated from 7s up to 9.2. Off GLP Interested to restart therapy Concern with diet less adherent lately with major stressors see below Meds: Tradjenta 5mg  daily, Metformin XR 750mg  x 2 = 1500mg  daily Previously on Ozempic but higher cost and issues with biliary problems Reports  good compliance. Tolerating well w/o side-effects Improving diet goals Denies hypoglycemia, polyuria, visual changes, numbness or tingling.   History provoked DVT - completed course anticoagulation   Aortic Atherosclerosis On prior imaging 03/09/22 CT Abd She is on Statin therapy  Hypothyroidism Last lab shows TSH 4.8 and Free T4 1.1 normal range Currently on Levothyroxine 125 mcg daily She is doing well overall on this dose.  HYPERTENSION CKD III  Reports BP controlled Improved Creatinine 1.01 Current Meds - Metoprolol 12.5mg  XL daily (half of 25), Losartan 100mg  daily   Reports good compliance, took meds today. Tolerating well, w/o complaints. Denies CP, dyspnea, HA, edema, dizziness / lightheadedness  HYPERLIPIDEMIA: - Reports no concerns. Last lipid panel controlled LDL 30s - Currently taking Zetia 10mg  and Rosuvastatin 20mg  , tolerating well without side effects or myalgias  Health Maintenance: She has apt at [facility] to [facility] for COVID + Flu     12/13/2022    1:20 PM 08/02/2022    6:03 PM 07/02/2022   12:21 PM  Depression screen PHQ 2/9  Decreased Interest 1 1 2   Down, Depressed, Hopeless 1 1 2   PHQ - 2 Score 2  2 4  Altered sleeping 2 1 2   Tired, decreased energy 1 2 2   Change in appetite 2 0 0  Feeling bad or failure about yourself   0 0  Trouble concentrating 1 1 2   Moving slowly or fidgety/restless 0 0 0  Suicidal thoughts 0 0 0  PHQ-9 Score 8 6 10   Difficult doing work/chores Somewhat difficult Somewhat difficult Somewhat difficult       12/13/2022    1:20 PM 04/30/2022   11:10 AM 06/11/2021    8:45 AM 01/02/2020    1:54 PM  GAD 7 : Generalized Anxiety Score  Nervous, Anxious, on Edge 0 0 0 1  Control/stop worrying 0 0 0 0  Worry too much - different things 0 0 0 0  Trouble relaxing 0 0 0 0  Restless 0 0 0 0  Easily annoyed or irritable 0 0 0 0  Afraid - awful might happen 0 0 0 0  Total GAD 7 Score 0 0 0 1  Anxiety Difficulty  Not difficult at all Not difficult at all Not difficult at all      Past Medical History:  Diagnosis Date   Coronary artery disease    a. 07/2013 STEMI/PCI ([place]): LM nl, LAD 100p (thrombectomy & 3.5x23 Alpine DES), LCX nl, RCA nl, EF 45%.   Depression    Diastolic dysfunction    a.) TTE 11/26/2020 - LVEF 50-55%; G2DD.   Endometrial cancer (HCC)    Gravida 3 para 3    Hyperlipidemia    Hypertension    Hypertensive heart disease    Hypothyroid    Ischemic cardiomyopathy    a.) 07/2013 EF 45% @ time of STEMI; b.) 11/2013 Echo: EF 55-60%, mildly dil LA, nl RV fxn. c.) TTE 07/29/2015 - LVEF 55-60%, mild LA and RV dilation. d.) TTE 11/26/2020 - LVEF 50-55%; G2DD.   Menopause    Obstructive sleep apnea on CPAP    Osteoarthritis    PONV (postoperative nausea and vomiting)    ST elevation myocardial infarction (STEMI) of anterolateral wall (HCC) 08/14/2013   a.) LVEF reduced at 45%; Tx'd with trombectomy and PCI to 100% LAD; 3.5 x 23 mm Xience Alpine DES placed.   T2DM (type 2 diabetes mellitus) (HCC)    Thyroid cancer (HCC)    a.) s/p total thyroidectomy. b.) on daily levothyroxine.   Past Surgical History:  Procedure Laterality Date   CATARACT EXTRACTION W/PHACO Left 08/30/2016   Procedure: CATARACT EXTRACTION PHACO AND INTRAOCULAR LENS PLACEMENT (IOC) Left daibetic;  Surgeon: Lockie Mola, MD;  Location: [facility];  Service: Ophthalmology;  Laterality: Left;  Diabetic - insulin and oral meds sleep apnea   CATARACT EXTRACTION W/PHACO Right 10/07/2016   Procedure: CATARACT  EXTRACTION PHACO AND INTRAOCULAR LENS PLACEMENT (IOC);  Surgeon: Lockie Mola, MD;  Location: [facility];  Service: Ophthalmology;  Laterality: Right;  Lot# [id number] H [place]: 00:43.3 AP%: 12.2 CDE: 5.28   CORONARY ANGIOPLASTY WITH STENT PLACEMENT Left 08/14/2013   Procedure: CORONARY ANGIOPLASTY WITH STENT PLACEMENT (3.5 x 23 mm Xience Alpine DES to LAD); Location: [facility], [place], [place]; Surgeon: Artist Pais, MD   GANGLION CYST EXCISION Right 1980   IR EXCHANGE BILIARY DRAIN  02/23/2022   IR PERC CHOLECYSTOSTOMY  01/28/2022   IR PERC CHOLECYSTOSTOMY  01/31/2022   LAPAROSCOPIC HYSTERECTOMY     THYROIDECTOMY  1995   total   TOTAL KNEE ARTHROPLASTY Right 12/18/2020   Procedure: RIGHT TOTAL KNEE ARTHROPLASTY;  Surgeon: Juanell Fairly, MD;  Location: [facility];  Service: Orthopedics;  Laterality: Right;   TOTAL KNEE ARTHROPLASTY Left 12/10/2021   Procedure: TOTAL KNEE ARTHROPLASTY;  Surgeon: Juanell Fairly, MD;  Location: [facility];  Service: Orthopedics;  Laterality: Left;   Social History   Socioeconomic History   Marital status: Widowed    Spouse name: Not on file   Number of children: 3   Years of education: Not on file   Highest education level: Bachelor's degree (e.g., BA, AB, BS)  Occupational History   Occupation: retired  Tobacco Use   Smoking status: Never   Smokeless tobacco: Never  Vaping Use   Vaping status: Never Used  Substance and Sexual Activity   Alcohol use: No   Drug use: No   Sexual activity: Not on file  Other Topics Concern   Not on file  Social History Narrative   Not on file   Social Determinants of Health   Financial Resource Strain: Low Risk  (07/30/2022)   Overall Financial Resource Strain (CARDIA)    Difficulty of Paying Living Expenses: Not hard at all  Food Insecurity: No Food Insecurity (07/30/2022)   Hunger Vital Sign    Worried About Running Out of Food in the Last Year: Never true    Ran Out of Food in the Last Year: Never  true  Transportation Needs: No Transportation Needs (07/30/2022)   PRAPARE - Administrator, Civil Service (Medical): No    Lack of Transportation (Non-Medical): No  Physical Activity: Insufficiently Active (07/30/2022)   Exercise Vital Sign    Days of Exercise per Week: 1 day    Minutes of Exercise per Session: 20 min  Stress: Stress Concern Present (07/30/2022)   [employer] of Occupational Health - Occupational Stress Questionnaire    Feeling of Stress : To some extent  Social Connections: Moderately Isolated (07/30/2022)   Social Connection and Isolation Panel [NHANES]    Frequency of Communication with Friends and Family: More than three times a week    Frequency of Social Gatherings with Friends and Family: Twice a week    Attends Religious Services: Never    Database administrator or Organizations: Yes    Attends Engineer, structural: More than 4 times per year    Marital Status: Widowed  Intimate Partner Violence: Not At Risk (07/02/2022)   Humiliation, Afraid, Rape, and Kick questionnaire    Fear of Current or Ex-Partner: No    Emotionally Abused: No    Physically Abused: No    Sexually Abused: No   Family History  Problem Relation Age of Onset   Heart attack Father    Heart failure Father    Hypertension Father    Hyperlipidemia Father    Heart disease Father 59       CABG    Pulmonary fibrosis Son    Breast cancer Neg Hx    Current Outpatient Medications on File Prior to Visit  Medication Sig   acetaminophen (TYLENOL) 650 MG CR tablet Take 1,950 mg by mouth daily.   aspirin EC 81 MG tablet Take 81 mg by mouth daily. Swallow whole.   ezetimibe (ZETIA) 10 MG tablet Take 1 tablet (10 mg total) by mouth daily.   FLUoxetine (PROZAC) 10 MG capsule TAKE 1 CAPSULE BY MOUTH DAILY   fluticasone (FLONASE) 50 MCG/ACT nasal spray Place 2 sprays into both nostrils daily. (Patient taking differently: Place 2-3 sprays into both nostrils at bedtime as  needed.)   furosemide (LASIX)  20 MG tablet TAKE 1 TABLET BY MOUTH DAILY AS  NEEDED FOR EDEMA OR FLUID   Lancets (ONETOUCH ULTRASOFT) lancets Use to check blood sugar daily   levothyroxine (SYNTHROID) 125 MCG tablet TAKE 1 TABLET BY MOUTH  DAILY BEFORE BREAKFAST   losartan (COZAAR) 100 MG tablet Take 1 tablet (100 mg total) by mouth daily.   metFORMIN (GLUCOPHAGE-XR) 750 MG 24 hr tablet Take 2 tablets (1,500 mg total) by mouth daily with breakfast.   metoprolol succinate (TOPROL-XL) 25 MG 24 hr tablet TAKE ONE-HALF TABLET BY MOUTH AT BEDTIME   ONETOUCH ULTRA test strip Use to check blood sugar daily   rosuvastatin (CRESTOR) 20 MG tablet Take 1 tablet (20 mg total) by mouth daily.   nitroGLYCERIN (NITROSTAT) 0.4 MG SL tablet Place 1 tablet (0.4 mg total) under the tongue every 5 (five) minutes as needed for chest pain. (Patient not taking: Reported on 12/13/2022)   No current facility-administered medications on file prior to visit.    Review of Systems  Constitutional:  Negative for activity change, appetite change, chills, diaphoresis, fatigue and fever.  HENT:  Negative for congestion and hearing loss.   Eyes:  Negative for visual disturbance.  Respiratory:  Negative for cough, chest tightness, shortness of breath and wheezing.   Cardiovascular:  Negative for chest pain, palpitations and leg swelling.  Gastrointestinal:  Negative for abdominal pain, constipation, diarrhea, nausea and vomiting.  Genitourinary:  Negative for dysuria, frequency and hematuria.  Musculoskeletal:  Negative for arthralgias and neck pain.  Skin:  Negative for rash.  Neurological:  Negative for dizziness, weakness, light-headedness, numbness and headaches.  Hematological:  Negative for adenopathy.  Psychiatric/Behavioral:  Negative for behavioral problems, dysphoric mood and sleep disturbance.    Per HPI unless specifically indicated above      Objective:    BP 120/72   Pulse (!) 51   Ht 5\' 6"  (1.676 m)    Wt 238 lb 9.6 oz (108.2 kg)   SpO2 98%   BMI 38.51 kg/m   Wt Readings from Last 3 Encounters:  12/13/22 238 lb 9.6 oz (108.2 kg)  08/02/22 220 lb (99.8 kg)  07/02/22 217 lb (98.4 kg)    Physical Exam Vitals and nursing note reviewed.  Constitutional:      General: She is not in acute distress.    Appearance: She is well-developed. She is obese. She is not diaphoretic.     Comments: Well-appearing, comfortable, cooperative  HENT:     Head: Normocephalic and atraumatic.  Eyes:     General:        Right eye: No discharge.        Left eye: No discharge.     Conjunctiva/sclera: Conjunctivae normal.     Pupils: Pupils are equal, round, and reactive to light.  Neck:     Thyroid: No thyromegaly.  Cardiovascular:     Rate and Rhythm: Normal rate and regular rhythm.     Pulses: Normal pulses.     Heart sounds: Normal heart sounds. No murmur heard. Pulmonary:     Effort: Pulmonary effort is normal. No respiratory distress.     Breath sounds: Normal breath sounds. No wheezing or rales.  Abdominal:     General: Bowel sounds are normal. There is no distension.     Palpations: Abdomen is soft. There is no mass.     Tenderness: There is no abdominal tenderness.  Musculoskeletal:        General: No tenderness. Normal range of motion.  Cervical back: Normal range of motion and neck supple.     Right lower leg: No edema.     Left lower leg: No edema.     Comments: Upper / Lower Extremities: - Normal muscle tone, strength bilateral upper extremities 5/5, lower extremities 5/5  Lymphadenopathy:     Cervical: No cervical adenopathy.  Skin:    General: Skin is warm and dry.     Findings: No erythema or rash.  Neurological:     Mental Status: She is alert and oriented to person, place, and time.     Comments: Distal sensation intact to light touch all extremities  Psychiatric:        Mood and Affect: Mood normal.        Behavior: Behavior normal.        Thought Content: Thought  content normal.     Comments: Well groomed, good eye contact, normal speech and thoughts     Diabetic Foot Exam - Simple   Simple Foot Form Diabetic Foot exam was performed with the following findings: Yes 12/13/2022  1:56 PM  Visual Inspection No deformities, no ulcerations, no other skin breakdown bilaterally: Yes Sensation Testing Intact to touch and monofilament testing bilaterally: Yes Pulse Check Posterior Tibialis and Dorsalis pulse intact bilaterally: Yes Comments      Results for orders placed or performed in visit on 12/03/22  T4, free  Result Value Ref Range   Free T4 1.1 0.8 - 1.8 ng/dL  TSH  Result Value Ref Range   TSH 4.89 (H) 0.40 - 4.50 mIU/L  Hemoglobin A1c  Result Value Ref Range   Hgb A1c MFr Bld 9.2 (H) <5.7 % of total Hgb   Mean Plasma Glucose 217 mg/dL   eAG (mmol/L) 84.6 mmol/L  Lipid panel  Result Value Ref Range   Cholesterol 112 <200 mg/dL   HDL 62 > OR = 50 mg/dL   Triglycerides 94 <962 mg/dL   LDL Cholesterol (Calc) 32 mg/dL (calc)   Total CHOL/HDL Ratio 1.8 <5.0 (calc)   Non-HDL Cholesterol (Calc) 50 <952 mg/dL (calc)  CBC with Differential/Platelet  Result Value Ref Range   WBC 5.2 3.8 - 10.8 Thousand/uL   RBC 3.85 3.80 - 5.10 Million/uL   Hemoglobin 11.5 (L) 11.7 - 15.5 g/dL   HCT 84.1 32.4 - 40.1 %   MCV 97.1 80.0 - 100.0 fL   MCH 29.9 27.0 - 33.0 pg   MCHC 30.7 (L) 32.0 - 36.0 g/dL   RDW 02.7 25.3 - 66.4 %   Platelets 201 140 - 400 Thousand/uL   MPV 10.1 7.5 - 12.5 fL   Neutro Abs 3,021 1,500 - 7,800 cells/uL   Lymphs Abs 1,352 850 - 3,900 cells/uL   Absolute Monocytes 494 200 - 950 cells/uL   Eosinophils Absolute 302 15 - 500 cells/uL   Basophils Absolute 31 0 - 200 cells/uL   Neutrophils Relative % 58.1 %   Total Lymphocyte 26.0 %   Monocytes Relative 9.5 %   Eosinophils Relative 5.8 %   Basophils Relative 0.6 %  COMPLETE METABOLIC PANEL WITH GFR  Result Value Ref Range   Glucose, Bld 169 (H) 65 - 99 mg/dL   BUN 25 7  - 25 mg/dL   Creat 4.03 (H) 4.74 - 1.00 mg/dL   eGFR 57 (L) > OR = 60 mL/min/1.60m2   BUN/Creatinine Ratio 25 (H) 6 - 22 (calc)   Sodium 139 135 - 146 mmol/L   Potassium 4.7 3.5 - 5.3 mmol/L  Chloride 104 98 - 110 mmol/L   CO2 26 20 - 32 mmol/L   Calcium 8.6 8.6 - 10.4 mg/dL   Total Protein 6.9 6.1 - 8.1 g/dL   Albumin 3.8 3.6 - 5.1 g/dL   Globulin 3.1 1.9 - 3.7 g/dL (calc)   AG Ratio 1.2 1.0 - 2.5 (calc)   Total Bilirubin 0.5 0.2 - 1.2 mg/dL   Alkaline phosphatase (APISO) 81 37 - 153 U/L   AST 15 10 - 35 U/L   ALT 12 6 - 29 U/L      Assessment & Plan:   Problem List Items Addressed This Visit     Atherosclerosis of aorta (HCC)    Stable on prior imaging CT 03/09/22 On Statin and Zetia      Benign hypertension with CKD (chronic kidney disease) stage III (HCC)    Well-controlled HTN Complication with CKD-III Off Amlodipine   Plan:  1. Continue current BP regimen Losartan 100mg  daily, Metoprolol XL 12.5mg  daily 2. Encourage improved lifestyle - low sodium diet, regular exercise 3. Continue monitor BP outside office, bring readings to next visit, if persistently >140/90 or new symptoms notify office sooner      Controlled type 2 diabetes mellitus with diabetic nephropathy (HCC)    A1c up to 9.2 limited control on Tradjenta and Metformin Complications - CKD-III Hyperlipidemia, Hypothyroidism Prior meds: Januvia, insulin Off Ozempic due to biliary Now s/p cholecystectomy  Plan:  Trial on GLP GIP therapy Mounjaro 2.5mg  weekly inj dose adjust monthly up to 5mg  and on Discontinue Tradjenta Continue Metformin XR 750mg  x 2 = 1500 daily  2. Encourage improved lifestyle - low carb, low sugar diet, reduce portion size, continue improving regular exercise 3. Check CBG, bring log to next visit for review 4. Continue ASA, ARB, Statin      Relevant Medications   MOUNJARO 2.5 MG/0.5ML Pen   Hyperlipidemia associated with type 2 diabetes mellitus (HCC)    Controlled  cholesterol, improved TG on statin and zetia and diet  LDL goal < 70 per Cards ASCVD risk score is elevated due to known DM, CAD  The ASCVD Risk score (Arnett DK, et al., 2019) failed to calculate for the following reasons:   The patient has a prior MI or stroke diagnosis   Plan: 1. Continue current meds - Rosuvastatin 20mg  daily + Zetia per Cards - Reconsider [id number] in future 2. Continue ASA 81mg  for secondary ASCVD risk reduction 3. Encourage improved lifestyle - low carb/cholesterol, reduce portion size, continue improving exercise 4. Follow-up yearly w/ lipid      Relevant Medications   MOUNJARO 2.5 MG/0.5ML Pen   Hypothyroidism    Labs reviewed Controlled on current dose Levothyroxine      Other Visit Diagnoses     Annual physical exam    -  Primary   Measles, mumps, rubella (MMR) vaccination status unknown       Relevant Orders   Measles/Mumps/Rubella Immunity   Varicella vaccination status unknown       Relevant Orders   Varicella zoster antibody, IgG   Screening-pulmonary TB       Relevant Orders   QuantiFERON-TB Gold Plus   Primary osteoarthritis involving multiple joints       Relevant Medications   traMADol (ULTRAM) 50 MG tablet       Updated Health Maintenance information Reviewed recent lab results with patient Encouraged improvement to lifestyle with diet and exercise Goal of weight loss  Assessment and Plan  Joint Pain Occasional use of Tramadol for joint pain, particularly before physical activity. -Refill Tramadol as needed for joint pain.  General Health Maintenance / Followup Plans -Flu and COVID vaccines scheduled for today. -Order titers for MMR, Varicella, and TB to meet [facility] volunteer requirements. -Continue foot checks for diabetic neuropathy, passed today's check. -Follow-up in 6 months or sooner if issues arise with new medication.     Orders Placed This Encounter  Procedures   QuantiFERON-TB Gold Plus    Measles/Mumps/Rubella Immunity   Varicella zoster antibody, IgG       Meds ordered this encounter  Medications   MOUNJARO 2.5 MG/0.5ML Pen    Sig: Inject 2.5 mg into the skin once a week.    Dispense:  2 mL    Refill:  0   traMADol (ULTRAM) 50 MG tablet    Sig: Take 1 tablet (50 mg total) by mouth every 6 (six) hours as needed.    Dispense:  30 tablet    Refill:  0      Follow up plan: Return in about 6 months (around 06/13/2023) for 6 month DM A1c.  Saralyn Pilar, DO [facility] 12/13/2022, 1:28 PM

## 2022-12-13 NOTE — Assessment & Plan Note (Signed)
Labs reviewed Controlled on current dose Levothyroxine 

## 2022-12-14 ENCOUNTER — Other Ambulatory Visit: Payer: Self-pay | Admitting: Family Medicine

## 2022-12-14 DIAGNOSIS — E890 Postprocedural hypothyroidism: Secondary | ICD-10-CM

## 2022-12-15 LAB — QUANTIFERON-TB GOLD PLUS
Mitogen-NIL: 6.69 [IU]/mL
NIL: 0.02 [IU]/mL
QuantiFERON-TB Gold Plus: NEGATIVE
TB1-NIL: 0.02 [IU]/mL
TB2-NIL: 0.02 [IU]/mL

## 2022-12-15 LAB — MEASLES/MUMPS/RUBELLA IMMUNITY
Mumps IgG: 253 [AU]/ml
Rubella: 27.7 {index}
Rubeola IgG: >300 [AU]/ml

## 2022-12-15 LAB — VARICELLA ZOSTER ANTIBODY, IGG: Varicella IgG: 19.7 {s_co_ratio}

## 2022-12-15 NOTE — Telephone Encounter (Signed)
Requested Prescriptions  Pending Prescriptions Disp Refills   levothyroxine (SYNTHROID) 125 MCG tablet [Pharmacy Med Name: ALVOGEN-LEVOTHYROXINE TAB 125MCG] 100 tablet 2    Sig: TAKE 1 TABLET BY MOUTH DAILY  BEFORE BREAKFAST     Endocrinology:  Hypothyroid Agents Failed - 12/14/2022  1:28 PM      Failed - TSH in normal range and within 360 days    TSH  Date Value Ref Range Status  12/06/2022 4.89 (H) 0.40 - 4.50 mIU/L Final         Passed - Valid encounter within last 12 months    Recent Outpatient Visits           2 days ago Annual physical exam   [facility] [place], Netta Neat, DO   4 months ago Controlled type 2 diabetes mellitus with diabetic nephropathy, without long-term current use of insulin [facility])   [facility] [place], Netta Neat, DO   7 months ago Controlled type 2 diabetes mellitus with diabetic nephropathy, without long-term current use of insulin [facility])   [facility] Smitty Cords, DO   10 months ago Acute cholecystitis   [facility] [place], Netta Neat, DO   1 year ago Controlled type 2 diabetes mellitus with diabetic nephropathy, without long-term current use of insulin [facility])   [facility], Netta Neat, DO       Future Appointments             In 6 months Althea Charon, Netta Neat, DO [facility]

## 2023-02-01 ENCOUNTER — Encounter: Payer: Self-pay | Admitting: Family Medicine

## 2023-02-01 ENCOUNTER — Other Ambulatory Visit: Payer: Self-pay | Admitting: Cardiovascular Disease

## 2023-02-01 ENCOUNTER — Other Ambulatory Visit: Payer: Self-pay | Admitting: Family Medicine

## 2023-02-01 DIAGNOSIS — N183 Chronic kidney disease, stage 3 unspecified: Secondary | ICD-10-CM

## 2023-02-01 DIAGNOSIS — E1121 Type 2 diabetes mellitus with diabetic nephropathy: Secondary | ICD-10-CM

## 2023-02-01 DIAGNOSIS — E890 Postprocedural hypothyroidism: Secondary | ICD-10-CM

## 2023-02-01 MED ORDER — LEVOTHYROXINE SODIUM 125 MCG PO TABS: ORAL_TABLET | ORAL | 3 refills | Status: DC

## 2023-03-13 IMAGING — MG DIGITAL DIAGNOSTIC BILAT W/ TOMO W/ CAD
6 of 10 series · 6 of 30 positions shown · non-contrast
Comparison: Previous exam(s).

CLINICAL DATA: Patient describes a palpable lump within the LEFT
axilla. Patient was able to express fluid from the area last week
with subsequent reduction in size.

EXAM:
DIGITAL DIAGNOSTIC BILATERAL MAMMOGRAM WITH TOMOSYNTHESIS AND CAD;
ULTRASOUND LEFT AXILLA LIMITED
TECHNIQUE: Bilateral digital diagnostic mammography and breast tomosynthesis
was performed. The images were evaluated with computer-aided
detection.; Targeted ultrasound examination of the left axilla was
performed

[L MLO synth-2D]
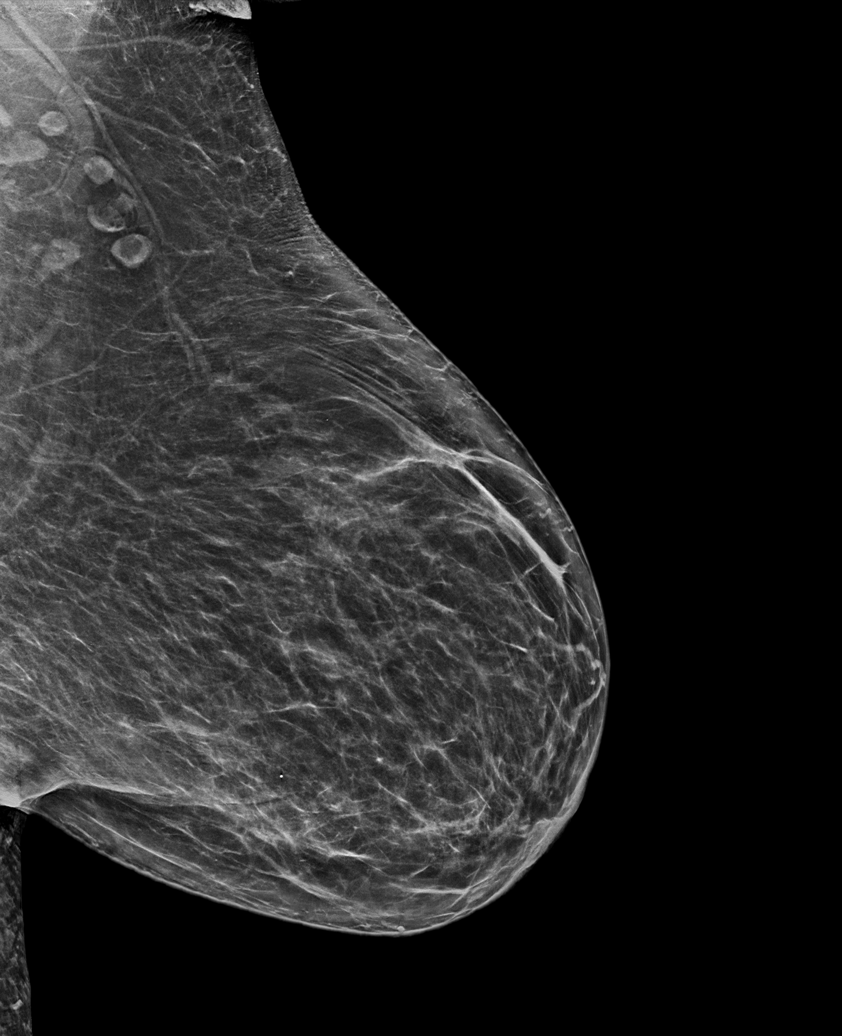

[L TAN synth-2D]
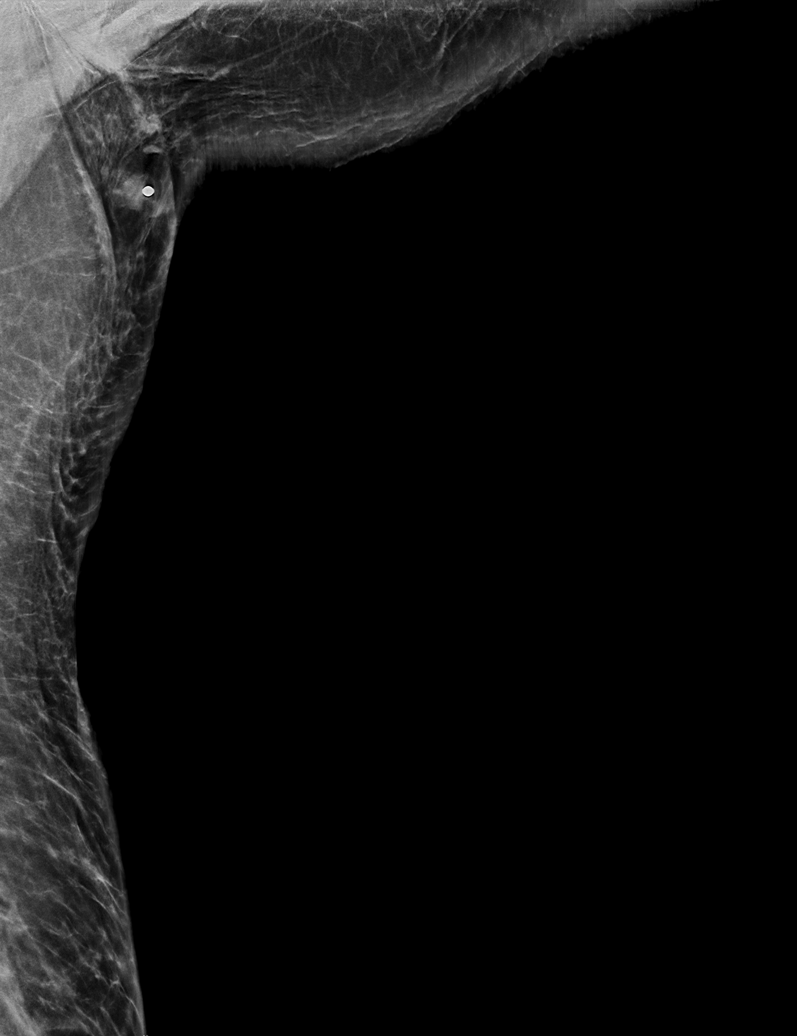

[L CC synth-2D]
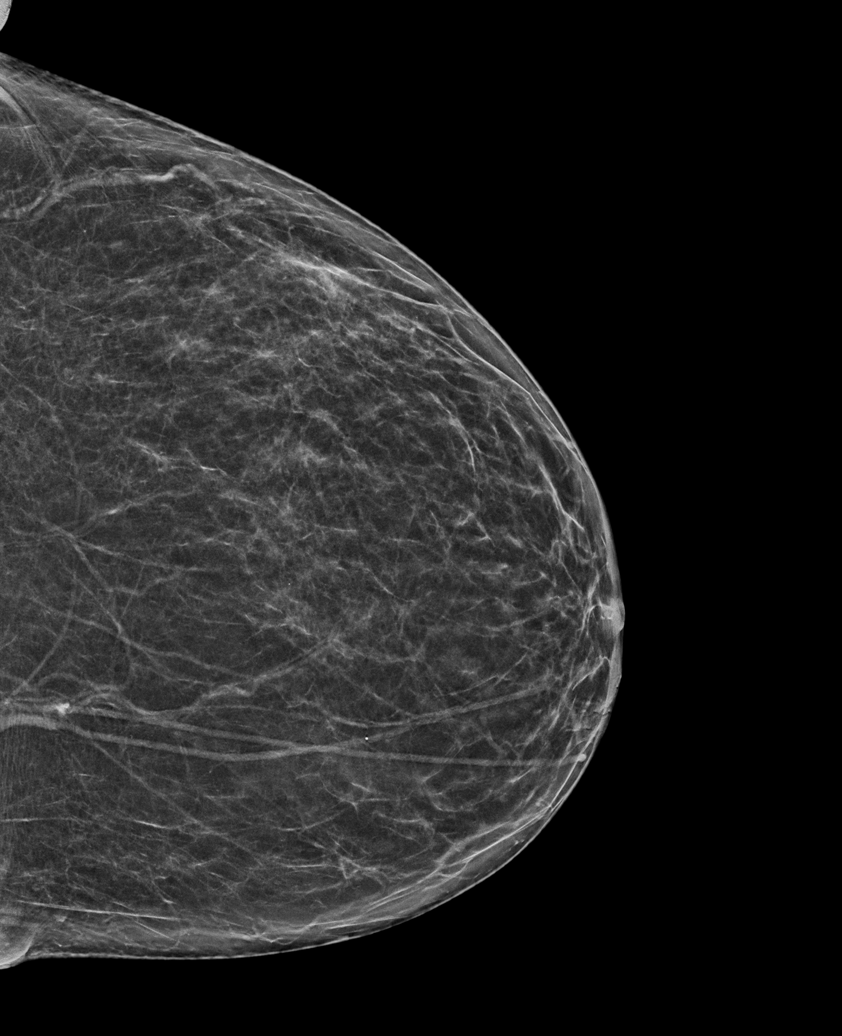

[R MLO synth-2D]
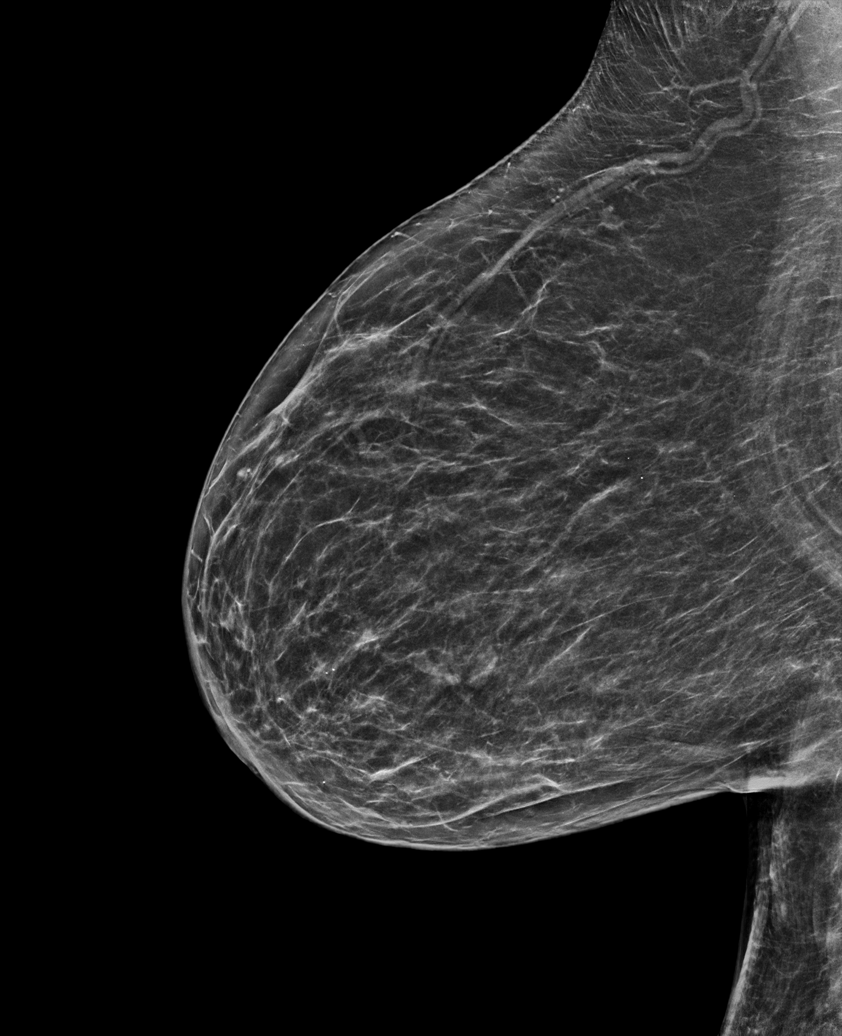

[R CC synth-2D]
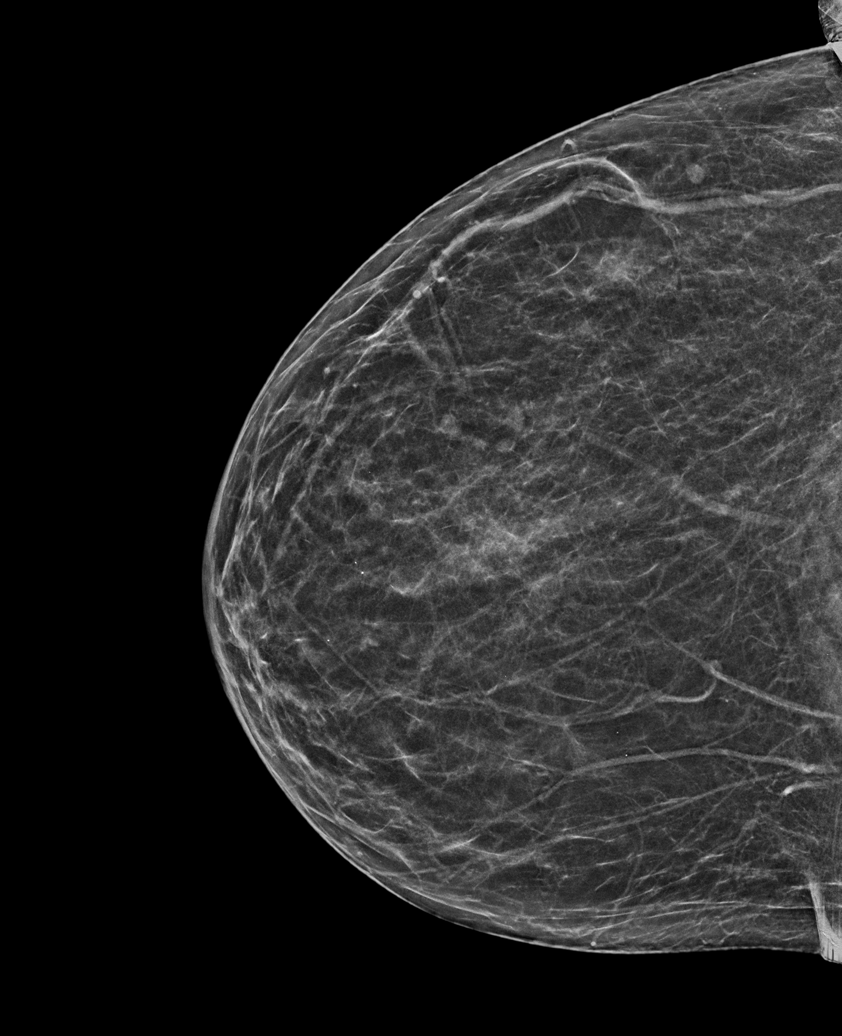

[L TAN tomo · tomo slice 31/62.0]
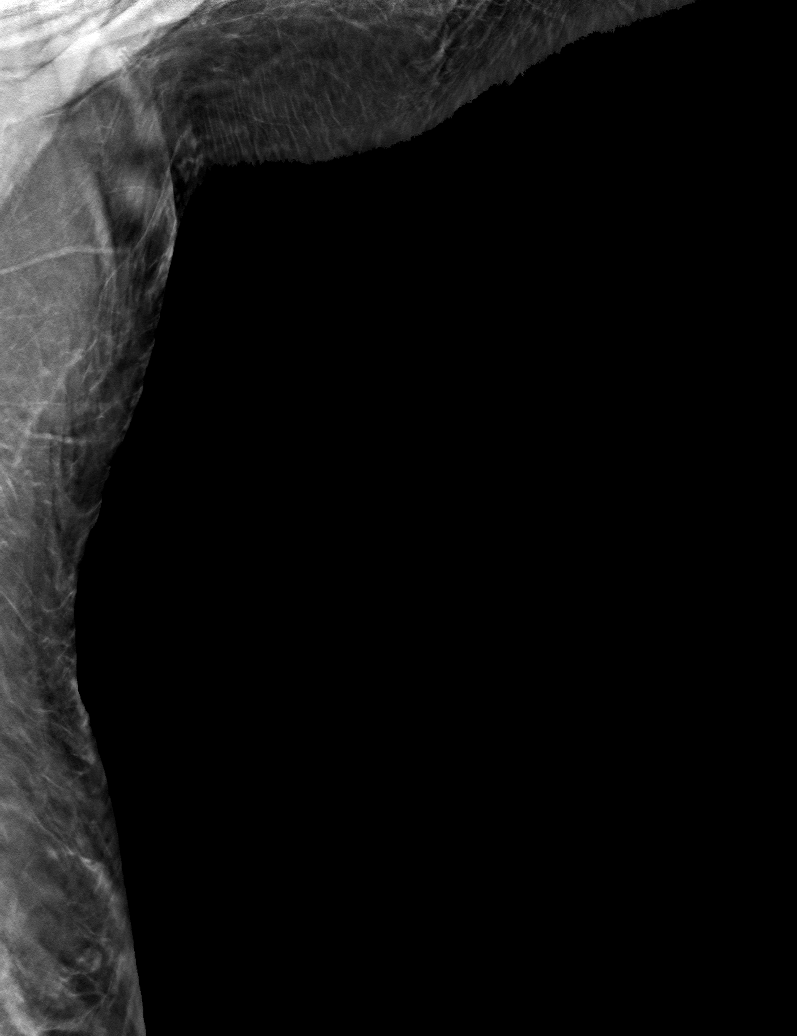

[6 of 30 positions shown; findings below may reference images not displayed]

ACR Breast Density Category b: There are scattered areas of
fibroglandular density.
FINDINGS: Bilateral diagnostic mammogram: There are no new dominant masses,
suspicious calcifications or secondary signs of malignancy within
either breast.

There is a partially obscured mass within the LEFT axilla, likely at
the skin based on tomosynthesis slice position, measuring
approximately 1 cm greatest dimension.

Targeted ultrasound is performed, evaluating the LEFT axilla as
directed by the patient, showing a complicated sebaceous cyst
localized to the skin of the LEFT axilla, measuring 6 mm greatest
dimension, with tract to the skin surface, corresponding to the
palpable area of concern. No mass or fluid collection/abscess within
the soft tissues underlying the skin. No enlarged or morphologically
abnormal lymph nodes are identified.
IMPRESSION: 1. Benign complicated sebaceous cyst localized to the skin of the
LEFT axilla, measuring 6 mm, corresponding to the palpable area of
concern.
2. No evidence of malignancy within either breast.

RECOMMENDATION:
1. Patient was reassured that sebaceous cysts are benign findings
and typically self-limiting. Patient was informed that warm
compresses can expedite healing. The patient was instructed to
return if the area that she feels becomes larger or firmer to
palpation. Patient was encouraged to follow-up with referring
physician if any associated skin redness, skin warmth, or pain
developed at the site as surgical consultation might then be needed
for definitive treatment.
2.  Screening mammogram in one year.(Code:NF-H-KDM)

I have discussed the findings and recommendations with the patient.
If applicable, a reminder letter will be sent to the patient
regarding the next appointment.

BI-RADS CATEGORY  2: Benign.

## 2023-03-24 ENCOUNTER — Other Ambulatory Visit: Payer: Self-pay | Admitting: Cardiovascular Disease

## 2023-03-24 DIAGNOSIS — E1169 Type 2 diabetes mellitus with other specified complication: Secondary | ICD-10-CM

## 2023-04-26 ENCOUNTER — Other Ambulatory Visit: Payer: Self-pay | Admitting: Family Medicine

## 2023-04-26 DIAGNOSIS — E1121 Type 2 diabetes mellitus with diabetic nephropathy: Secondary | ICD-10-CM

## 2023-04-26 DIAGNOSIS — I129 Hypertensive chronic kidney disease with stage 1 through stage 4 chronic kidney disease, or unspecified chronic kidney disease: Secondary | ICD-10-CM

## 2023-04-27 NOTE — Telephone Encounter (Signed)
 Requested Prescriptions  Pending Prescriptions Disp Refills   metFORMIN (GLUCOPHAGE-XR) 750 MG 24 hr tablet [Pharmacy Med Name: metFORMIN HCl ER 750 MG Oral Tablet Extended Release 24 Hour] 200 tablet 0    Sig: TAKE 2 TABLETS BY MOUTH DAILY  WITH BREAKFAST     Endocrinology:  Diabetes - Biguanides Failed - 04/27/2023  2:22 PM      Failed - Cr in normal range and within 360 days    Creat  Date Value Ref Range Status  12/06/2022 1.01 (H) 0.60 - 1.00 mg/dL Final   Creatinine, Urine  Date Value Ref Range Status  06/10/2022 79  Final         Failed - HBA1C is between 0 and 7.9 and within 180 days    Hemoglobin A1C  Date Value Ref Range Status  04/25/2022 7.9  Final   Hgb A1c MFr Bld  Date Value Ref Range Status  12/06/2022 9.2 (H) <5.7 % of total Hgb Final    Comment:    For someone without known diabetes, a hemoglobin A1c value of 6.5% or greater indicates that they may have  diabetes and this should be confirmed with a follow-up  test. . For someone with known diabetes, a value <7% indicates  that their diabetes is well controlled and a value  greater than or equal to 7% indicates suboptimal  control. A1c targets should be individualized based on  duration of diabetes, age, comorbid conditions, and  other considerations. . Currently, no consensus exists regarding use of hemoglobin A1c for diagnosis of diabetes for children. .          Failed - eGFR in normal range and within 360 days    GFR, Est African American  Date Value Ref Range Status  06/24/2020 67 > OR = 60 mL/min/1.6m2 Final   GFR, Est Non African American  Date Value Ref Range Status  06/24/2020 58 (L) > OR = 60 mL/min/1.10m2 Final   GFR, Estimated  Date Value Ref Range Status  03/08/2022 >60 >60 mL/min Final    Comment:    (NOTE) Calculated using the CKD-EPI Creatinine Equation (2021)    eGFR  Date Value Ref Range Status  12/06/2022 57 (L) > OR = 60 mL/min/1.36m2 Final         Failed - B12  Level in normal range and within 720 days    No results found for: "VITAMINB12"       Passed - Valid encounter within last 6 months    Recent Outpatient Visits           4 months ago Annual physical exam   [place] [facility] [place], Netta Neat, DO   8 months ago Controlled type 2 diabetes mellitus with diabetic nephropathy, without long-term current use of insulin [facility])   [place] [facility] [place], Netta Neat, DO   12 months ago Controlled type 2 diabetes mellitus with diabetic nephropathy, without long-term current use of insulin [facility])   [place] [facility] Smitty Cords, DO   1 year ago Acute cholecystitis   [place] [facility] [place], Netta Neat, DO   1 year ago Controlled type 2 diabetes mellitus with diabetic nephropathy, without long-term current use of insulin [facility])    [facility] Smitty Cords, DO       Future Appointments             In 1 month  Althe[place] Charon, Netta Neat, DO [place] [facility]            Passed - CBC within normal limits and completed in the last 12 months    WBC  Date Value Ref Range Status  12/06/2022 5.2 3.8 - 10.8 Thousand/uL Final   RBC  Date Value Ref Range Status  12/06/2022 3.85 3.80 - 5.10 Million/uL Final   Hemoglobin  Date Value Ref Range Status  12/06/2022 11.5 (L) 11.7 - 15.5 g/dL Final   HGB  Date Value Ref Range Status  02/14/2013 11.6 (L) 12.0 - 16.0 g/dL Final   HCT  Date Value Ref Range Status  12/06/2022 37.4 35.0 - 45.0 % Final  02/06/2013 35.9 35.0 - 47.0 % Final   MCHC  Date Value Ref Range Status  12/06/2022 30.7 (L) 32.0 - 36.0 g/dL Final    Comment:    For adults, a slight decrease in the calculated MCHC value (in the range of 30 to 32 g/dL) is most likely not clinically significant; however, it should be interpreted with caution in  correlation with other red cell parameters and the patient's clinical condition.    [facility]  Date Value Ref Range Status  12/06/2022 29.9 27.0 - 33.0 pg Final   MCV  Date Value Ref Range Status  12/06/2022 97.1 80.0 - 100.0 fL Final  02/06/2013 91 80 - 100 fL Final   No results found for: "PLTCOUNTKUC", "LABPLAT", "POCPLA" RDW  Date Value Ref Range Status  12/06/2022 11.4 11.0 - 15.0 % Final  02/06/2013 12.7 11.5 - 14.5 % Final          losartan (COZAAR) 100 MG tablet [Pharmacy Med Name: Losartan Potassium 100 MG Oral Tablet] 100 tablet 0    Sig: TAKE 1 TABLET BY MOUTH DAILY     Cardiovascular:  Angiotensin Receptor Blockers Failed - 04/27/2023  2:22 PM      Failed - Cr in normal range and within 180 days    Creat  Date Value Ref Range Status  12/06/2022 1.01 (H) 0.60 - 1.00 mg/dL Final   Creatinine, Urine  Date Value Ref Range Status  06/10/2022 79  Final         Passed - K in normal range and within 180 days    Potassium  Date Value Ref Range Status  12/06/2022 4.7 3.5 - 5.3 mmol/L Final  02/06/2013 4.1 3.5 - 5.1 mmol/L Final         Passed - Patient is not pregnant      Passed - Last BP in normal range    BP Readings from Last 1 Encounters:  12/13/22 120/72         Passed - Valid encounter within last 6 months    Recent Outpatient Visits           4 months ago Annual physical exam   [facility] Smitty Cords, DO   8 months ago Controlled type 2 diabetes mellitus with diabetic nephropathy, without long-term current use of insulin [facility])   [place] [facility] Smitty Cords, DO   12 months ago Controlled type 2 diabetes mellitus with diabetic nephropathy, without long-term current use of insulin [facility])   [place] [facility] Smitty Cords, DO   1 year ago Acute cholecystitis    [facility] Smitty Cords, DO   1 year ago  Controlled type 2 diabetes  mellitus with diabetic nephropathy, without long-term current use of insulin [facility])   [place] [facility] [place], Netta Neat, DO       Future Appointments             In 1 month Althea Charon, Netta Neat, DO [place] [facility]

## 2023-05-24 ENCOUNTER — Other Ambulatory Visit: Payer: Self-pay | Admitting: Cardiovascular Disease

## 2023-05-24 DIAGNOSIS — E1169 Type 2 diabetes mellitus with other specified complication: Secondary | ICD-10-CM

## 2023-06-08 ENCOUNTER — Encounter: Payer: Self-pay | Admitting: Family Medicine

## 2023-06-08 DIAGNOSIS — N189 Chronic kidney disease, unspecified: Secondary | ICD-10-CM | POA: Diagnosis not present

## 2023-06-08 DIAGNOSIS — E1122 Type 2 diabetes mellitus with diabetic chronic kidney disease: Secondary | ICD-10-CM | POA: Diagnosis not present

## 2023-06-08 DIAGNOSIS — D631 Anemia in chronic kidney disease: Secondary | ICD-10-CM | POA: Diagnosis not present

## 2023-06-08 DIAGNOSIS — N1831 Chronic kidney disease, stage 3a: Secondary | ICD-10-CM | POA: Diagnosis not present

## 2023-06-08 LAB — PROTEIN / CREATININE RATIO, URINE: Creatinine, Urine: 297

## 2023-06-08 LAB — MICROALBUMIN / CREATININE URINE RATIO: Microalb Creat Ratio: 77

## 2023-06-08 LAB — MICROALBUMIN, URINE: Microalb, Ur: 22.8

## 2023-06-13 ENCOUNTER — Ambulatory Visit (INDEPENDENT_AMBULATORY_CARE_PROVIDER_SITE_OTHER): Payer: Self-pay | Admitting: Family Medicine

## 2023-06-13 VITALS — BP 124/58 | HR 54 | Ht 66.0 in | Wt 238.4 lb

## 2023-06-13 DIAGNOSIS — E1121 Type 2 diabetes mellitus with diabetic nephropathy: Secondary | ICD-10-CM | POA: Diagnosis not present

## 2023-06-13 DIAGNOSIS — M150 Primary generalized (osteo)arthritis: Secondary | ICD-10-CM

## 2023-06-13 DIAGNOSIS — Z7984 Long term (current) use of oral hypoglycemic drugs: Secondary | ICD-10-CM | POA: Diagnosis not present

## 2023-06-13 DIAGNOSIS — N183 Chronic kidney disease, stage 3 unspecified: Secondary | ICD-10-CM

## 2023-06-13 DIAGNOSIS — I129 Hypertensive chronic kidney disease with stage 1 through stage 4 chronic kidney disease, or unspecified chronic kidney disease: Secondary | ICD-10-CM | POA: Diagnosis not present

## 2023-06-13 LAB — POCT GLYCOSYLATED HEMOGLOBIN (HGB A1C): Hemoglobin A1C: 10.9 % — AB (ref 4.0–5.6)

## 2023-06-13 MED ORDER — TRAMADOL HCL 50 MG PO TABS
50.0000 mg | ORAL_TABLET | Freq: Four times a day (QID) | ORAL | 0 refills | Status: DC | PRN
Start: 2023-06-13 — End: 2024-01-10

## 2023-06-13 NOTE — Progress Notes (Unsigned)
 Subjective:    Patient ID: [name], female    DOB: 05/03/1945, 78 y.o.   MRN: [id number]  [name] is a 78 y.o. female presenting on 06/13/2023 for No chief complaint on file.   HPI  *** Farxiga 10mg  started by Nephrology, has samples  Mounjaro 2.5mg  - x 2 doses only, she had nausea severely Metformin ER 750mg  x 2 = 1500mg  daily  ***    CHRONIC DM, Type 2: A1c elevated from 7s up to 9.2. Off GLP Interested to restart therapy Concern with diet less adherent lately with major stressors see below Meds: Tradjenta 5mg  daily, Metformin XR 750mg  x 2 = 1500mg  daily Previously on Ozempic but higher cost and issues with biliary problems Reports  good compliance. Tolerating well w/o side-effects Improving diet goals Denies hypoglycemia, polyuria, visual changes, numbness or tingling.   History provoked DVT - completed course anticoagulation   Aortic Atherosclerosis On prior imaging 03/09/22 CT Abd She is on Statin therapy   Hypothyroidism Last lab shows TSH 4.8 and Free T4 1.1 normal range Currently on Levothyroxine 125 mcg daily She is doing well overall on this dose.   HYPERTENSION CKD III  Reports BP controlled Improved Creatinine 1.01 Current Meds - Metoprolol 12.5mg  XL daily (half of 25), Losartan 100mg  daily   Reports good compliance, took meds today. Tolerating well, w/o complaints. Denies CP, dyspnea, HA, edema, dizziness / lightheadedness   HYPERLIPIDEMIA: - Reports no concerns. Last lipid panel controlled LDL 30s - Currently taking Zetia 10mg  and Rosuvastatin 20mg  , tolerating well without side effects or myalgias  The patient occasionally uses tramadol for joint pain, particularly before participating in activities such as bowling. She reports having only a few tablets left and requests a refill. She also mentions having received an RSV shot about ten years ago when her grandchild was born.    Health Maintenance: ***     06/13/2023    1:20  PM 12/13/2022    1:20 PM 08/02/2022    6:03 PM  Depression screen PHQ 2/9  Decreased Interest 0 1 1  Down, Depressed, Hopeless 0 1 1  PHQ - 2 Score 0 2 2  Altered sleeping 1 2 1   Tired, decreased energy 1 1 2   Change in appetite 1 2 0  Feeling bad or failure about yourself  0  0  Trouble concentrating 0 1 1  Moving slowly or fidgety/restless 0 0 0  Suicidal thoughts 0 0 0  PHQ-9 Score 3 8 6   Difficult doing work/chores Somewhat difficult Somewhat difficult Somewhat difficult       06/13/2023    1:20 PM 12/13/2022    1:20 PM 04/30/2022   11:10 AM 06/11/2021    8:45 AM  GAD 7 : Generalized Anxiety Score  Nervous, Anxious, on Edge 0 0 0 0  Control/stop worrying 0 0 0 0  Worry too much - different things 0 0 0 0  Trouble relaxing 0 0 0 0  Restless 0 0 0 0  Easily annoyed or irritable 0 0 0 0  Afraid - awful might happen 0 0 0 0  Total GAD 7 Score 0 0 0 0  Anxiety Difficulty   Not difficult at all Not difficult at all    Social History   Tobacco Use   Smoking status: Never   Smokeless tobacco: Never  Vaping Use   Vaping status: Never Used  Substance Use Topics   Alcohol use: No   Drug use: No  Review of Systems Per HPI unless specifically indicated above     Objective:    BP (!) 124/58 (BP Location: Left Arm, Patient Position: Sitting, Cuff Size: Large)   Pulse (!) 54   Ht 5\' 6"  (1.676 m)   Wt 238 lb 6 oz (108.1 kg)   SpO2 97%   BMI 38.47 kg/m   Wt Readings from Last 3 Encounters:  06/13/23 238 lb 6 oz (108.1 kg)  12/13/22 238 lb 9.6 oz (108.2 kg)  08/02/22 220 lb (99.8 kg)    Physical Exam  Urine Albumin / Creatinine Ratio Specimen: Urine - Urine, Clean Catch Component Ref Range & Units 5 d ago Comments  Creatinine, Ur 20 - 275 mg/dL 454 High    Urine Microalbumin See Note: mg/dL 09.8 Reference Range:                                  Reference Range                                 Not established  Microalb/Creat Ratio <30 mg/g creat 77 High               The ADA defines abnormalities in albumin      excretion as follows:             Albuminuria Category        Result (mg/g creatinine)             Normal to Mildly increased   <30      Moderately increased         30-299      Severely increased           > OR = 300             The ADA recommends that at least two of three      specimens collected within a 3-6 month period be      abnormal before considering a patient to be      within a diagnostic category.  Resulting Agency [facility]   Resulting Agency Comment  Performing Organization Information:     Site ID: [id number]     Name: [facility]     Address: [address] [place], [place] [address]     Director: Annamary Barrio [place] Specimen Collected: 06/08/23 10:45   Performed by: Stacie Dys Last Resulted: 06/09/23 12:15  Received From: [facility]  Result Received: 06/13/23 13:15    Results for orders placed or performed in visit on 06/13/23  Microalbumin, urine   Collection Time: 06/08/23 12:00 AM  Result Value Ref Range   Microalb, Ur 22.8   Microalbumin / creatinine urine ratio   Collection Time: 06/08/23 12:00 AM  Result Value Ref Range   Microalb Creat Ratio 77   Protein / creatinine ratio, urine   Collection Time: 06/08/23 12:00 AM  Result Value Ref Range   Creatinine, Urine 297   POCT HgB A1C   Collection Time: 06/13/23  1:27 PM  Result Value Ref Range   Hemoglobin A1C 10.9 (A) 4.0 - 5.6 %   HbA1c POC (<> result, manual entry)     HbA1c, POC (prediabetic range)     HbA1c, POC (controlled diabetic range)        Assessment & Plan:   Problem List Items Addressed  This Visit     Controlled type 2 diabetes mellitus with diabetic nephropathy (HCC) - Primary   Relevant Medications   dapagliflozin propanediol (FARXIGA) 10 MG TABS tablet   Other Relevant Orders   POCT HgB A1C (Completed)   Other Visit Diagnoses       Primary osteoarthritis involving multiple  joints       Relevant Medications   traMADol (ULTRAM) 50 MG tablet        ***  Orders Placed This Encounter  Procedures   Microalbumin, urine    This external order was created through the Results Console.   Microalbumin / creatinine urine ratio    This external order was created through the Results Console.   Protein / creatinine ratio, urine    This external order was created through the Results Console.   POCT HgB A1C    Meds ordered this encounter  Medications   traMADol (ULTRAM) 50 MG tablet    Sig: Take 1 tablet (50 mg total) by mouth every 6 (six) hours as needed.    Dispense:  30 tablet    Refill:  0    Follow up plan: Return in about 3 months (around 09/12/2023) for 3 month DM A1c.  Future labs ordered for ***   Domingo Friend, DO [facility] 06/13/2023, 1:28 PM

## 2023-06-13 NOTE — Patient Instructions (Addendum)
 Thank you for coming to the office today.  Recent Labs    08/02/22 1341 12/06/22 0907 06/13/23 1327  HGBA1C 7.6* 9.2* 10.9*   Okay to try the remaining samples of Mounjaro 2.5mg   Continue Farxiga 10mg   If still nauseas after sample  Try 3 month follow -up without extra meds just farxiga + metformin  Ask Nephrology about the PTH parathyroid  Please schedule a Follow-up Appointment to: Return in about 3 months (around 09/12/2023) for 3 month DM A1c.  If you have any other questions or concerns, please feel free to call the office or send a message through MyChart. You may also schedule an earlier appointment if necessary.  Additionally, you may be receiving a survey about your experience at our office within a few days to 1 week by e-mail or mail. We value your feedback.  Domingo Friend, DO [facility], [place]

## 2023-07-05 ENCOUNTER — Other Ambulatory Visit: Payer: Self-pay | Admitting: Family Medicine

## 2023-07-05 DIAGNOSIS — I129 Hypertensive chronic kidney disease with stage 1 through stage 4 chronic kidney disease, or unspecified chronic kidney disease: Secondary | ICD-10-CM

## 2023-07-05 DIAGNOSIS — E1121 Type 2 diabetes mellitus with diabetic nephropathy: Secondary | ICD-10-CM

## 2023-07-08 ENCOUNTER — Ambulatory Visit: Payer: Medicare Other

## 2023-07-08 DIAGNOSIS — E119 Type 2 diabetes mellitus without complications: Secondary | ICD-10-CM | POA: Diagnosis not present

## 2023-07-08 DIAGNOSIS — Z780 Asymptomatic menopausal state: Secondary | ICD-10-CM | POA: Diagnosis not present

## 2023-07-08 DIAGNOSIS — Z0000 Encounter for general adult medical examination without abnormal findings: Secondary | ICD-10-CM | POA: Diagnosis not present

## 2023-07-08 NOTE — Patient Instructions (Addendum)
 [name] , Thank you for taking time out of your busy schedule to complete your Annual Wellness Visit with me. I enjoyed our conversation and look forward to speaking with you again next year. I, as well as your care team,  appreciate your ongoing commitment to your health goals. Please review the following plan we discussed and let me know if I can assist you in the future.  Referrals: If you haven't heard from the office you've been referred to, please reach out to them at the phone provided.  [phone] FOR BONE DENSITY SCAN  Follow up Visits: Next Medicare AWV with our clinical staff: 07/13/24 @ 10:10 AM BY PHONE   Have you seen your provider in the last 6 months (3 months if uncontrolled diabetes)? Yes   Clinician Recommendations:  Aim for 30 minutes of exercise or brisk walking, 6-8 glasses of water , and 5 servings of fruits and vegetables each day. TAKE CARE!      This is a list of the screening recommended for you and due dates:  Health Maintenance  Topic Date Due   Zoster (Shingles) Vaccine (1 of 2) Never done   COVID-19 Vaccine (5 - Pfizer risk 2024-25 season) 06/13/2023   Eye exam for diabetics  07/23/2023   DTaP/Tdap/Td vaccine (2 - Td or Tdap) 08/03/2023   Flu Shot  09/30/2023   Yearly kidney function blood test for diabetes  12/06/2023   Complete foot exam   12/13/2023   Hemoglobin A1C  12/13/2023   Yearly kidney health urinalysis for diabetes  06/07/2024   Medicare Annual Wellness Visit  07/07/2024   Pneumonia Vaccine  Completed   DEXA scan (bone density measurement)  Completed   Hepatitis C Screening  Completed   HPV Vaccine  Aged Out   Meningitis B Vaccine  Aged Out   Cologuard (Stool DNA test)  Discontinued    Advanced directives: (ACP Link)Information on Advanced Care Planning can be found at [place]  Secretary of [facility] Advance Health Care Directives Advance Health Care Directives. [email or web address]  Advance Care Planning is important because it:  [x]  Makes  sure you receive the medical care that is consistent with your values, goals, and preferences  [x]  It provides guidance to your family and loved ones and reduces their decisional burden about whether or not they are making the right decisions based on your wishes.  Follow the link provided in your after visit summary or read over the paperwork we have mailed to you to help you started getting your Advance Directives in place. If you need assistance in completing these, please reach out to us  so that we can help you!

## 2023-07-08 NOTE — Telephone Encounter (Signed)
 Requested Prescriptions  Pending Prescriptions Disp Refills   losartan  (COZAAR ) 100 MG tablet [Pharmacy Med Name: Losartan  Potassium 100 MG Oral Tablet] 100 tablet 1    Sig: TAKE 1 TABLET BY MOUTH DAILY     Cardiovascular:  Angiotensin Receptor Blockers Failed - 07/08/2023  8:11 AM      Failed - Cr in normal range and within 180 days    Creat  Date Value Ref Range Status  12/06/2022 1.01 (H) 0.60 - 1.00 mg/dL Final   Creatinine, Urine  Date Value Ref Range Status  06/08/2023 297  Final         Failed - K in normal range and within 180 days    Potassium  Date Value Ref Range Status  12/06/2022 4.7 3.5 - 5.3 mmol/L Final  02/06/2013 4.1 3.5 - 5.1 mmol/L Final         Passed - Patient is not pregnant      Passed - Last BP in normal range    BP Readings from Last 1 Encounters:  06/13/23 (!) 124/58         Passed - Valid encounter within last 6 months    Recent Outpatient Visits           3 weeks ago Controlled type 2 diabetes mellitus with diabetic nephropathy, without long-term current use of insulin  (HCC)   [place] [facility] [place], Kayleen Party, DO               metFORMIN  (GLUCOPHAGE -XR) 750 MG 24 hr tablet [Pharmacy Med Name: metFORMIN  HCl ER 750 MG Oral Tablet Extended Release 24 Hour] 200 tablet 1    Sig: TAKE 2 TABLETS BY MOUTH DAILY  WITH BREAKFAST     Endocrinology:  Diabetes - Biguanides Failed - 07/08/2023  8:11 AM      Failed - Cr in normal range and within 360 days    Creat  Date Value Ref Range Status  12/06/2022 1.01 (H) 0.60 - 1.00 mg/dL Final   Creatinine, Urine  Date Value Ref Range Status  06/08/2023 297  Final         Failed - HBA1C is between 0 and 7.9 and within 180 days    Hemoglobin A1C  Date Value Ref Range Status  06/13/2023 10.9 (A) 4.0 - 5.6 % Final  04/25/2022 7.9  Final   Hgb A1c MFr Bld  Date Value Ref Range Status  12/06/2022 9.2 (H) <5.7 % of total Hgb Final    Comment:    For someone without known  diabetes, a hemoglobin A1c value of 6.5% or greater indicates that they may have  diabetes and this should be confirmed with a follow-up  test. . For someone with known diabetes, a value <7% indicates  that their diabetes is well controlled and a value  greater than or equal to 7% indicates suboptimal  control. A1c targets should be individualized based on  duration of diabetes, age, comorbid conditions, and  other considerations. . Currently, no consensus exists regarding use of hemoglobin A1c for diagnosis of diabetes for children. .          Failed - eGFR in normal range and within 360 days    GFR, Est African American  Date Value Ref Range Status  06/24/2020 67 > OR = 60 mL/min/1.9m2 Final   GFR, Est Non African American  Date Value Ref Range Status  06/24/2020 58 (L) > OR = 60 mL/min/1.99m2 Final   GFR, Estimated  Date Value Ref Range Status  03/08/2022 >60 >60 mL/min Final    Comment:    (NOTE) Calculated using the CKD-EPI Creatinine Equation (2021)    eGFR  Date Value Ref Range Status  12/06/2022 57 (L) > OR = 60 mL/min/1.22m2 Final         Failed - B12 Level in normal range and within 720 days    No results found for: "VITAMINB12"       Passed - Valid encounter within last 6 months    Recent Outpatient Visits           3 weeks ago Controlled type 2 diabetes mellitus with diabetic nephropathy, without long-term current use of insulin  [facility])   [place] [facility] [place], Kayleen Party, DO              Passed - CBC within normal limits and completed in the last 12 months    WBC  Date Value Ref Range Status  12/06/2022 5.2 3.8 - 10.8 Thousand/uL Final   RBC  Date Value Ref Range Status  12/06/2022 3.85 3.80 - 5.10 Million/uL Final   Hemoglobin  Date Value Ref Range Status  12/06/2022 11.5 (L) 11.7 - 15.5 g/dL Final   HGB  Date Value Ref Range Status  02/14/2013 11.6 (L) 12.0 - 16.0 g/dL Final   HCT  Date Value Ref  Range Status  12/06/2022 37.4 35.0 - 45.0 % Final  02/06/2013 35.9 35.0 - 47.0 % Final   MCHC  Date Value Ref Range Status  12/06/2022 30.7 (L) 32.0 - 36.0 g/dL Final    Comment:    For adults, a slight decrease in the calculated MCHC value (in the range of 30 to 32 g/dL) is most likely not clinically significant; however, it should be interpreted with caution in correlation with other red cell parameters and the patient's clinical condition.    [facility]  Date Value Ref Range Status  12/06/2022 29.9 27.0 - 33.0 pg Final   MCV  Date Value Ref Range Status  12/06/2022 97.1 80.0 - 100.0 fL Final  02/06/2013 91 80 - 100 fL Final   No results found for: "PLTCOUNTKUC", "LABPLAT", "POCPLA" RDW  Date Value Ref Range Status  12/06/2022 11.4 11.0 - 15.0 % Final  02/06/2013 12.7 11.5 - 14.5 % Final

## 2023-07-08 NOTE — Progress Notes (Signed)
 Subjective:   [name] is a 78 y.o. who presents for a Medicare Wellness preventive visit.  As a reminder, Annual Wellness Visits don't include a physical exam, and some assessments may be limited, especially if this visit is performed virtually. We may recommend an in-person visit if needed.  Visit Complete: Virtual I connected with  [name] on 07/08/23 by a audio enabled telemedicine application and verified that I am speaking with the correct person using two identifiers.  Patient Location: Home  Provider Location: Office/Clinic  I discussed the limitations of evaluation and management by telemedicine. The patient expressed understanding and agreed to proceed.  Vital Signs: Because this visit was a virtual/telehealth visit, some criteria may be missing or patient reported. Any vitals not documented were not able to be obtained and vitals that have been documented are patient reported.  VideoDeclined- This patient declined Librarian, academic. Therefore the visit was completed with audio only.  Persons Participating in Visit: Patient.  AWV Questionnaire: No: Patient Medicare AWV questionnaire was not completed prior to this visit.  Cardiac Risk Factors include: advanced age (>88men, >30 women);diabetes mellitus;hypertension;dyslipidemia;sedentary lifestyle;obesity (BMI >30kg/m2)     Objective:     There were no vitals filed for this visit. There is no height or weight on file to calculate BMI.     07/08/2023   10:57 AM 07/02/2022   12:22 PM 03/08/2022   10:40 PM 02/01/2022   10:00 PM 01/28/2022    3:59 AM 01/27/2022    9:42 AM 12/10/2021   12:15 PM  Advanced Directives  Does Patient Have a Medical Advance Directive? Yes Yes No  Yes Yes Yes  Type of Estate agent of [place];Living will Healthcare Power of [place];Living will   Healthcare Power of [place];Living will Healthcare Power of [place];Living will  Healthcare Power of [place];Living will  Does patient want to make changes to medical advance directive? No - Patient declined No - Patient declined  No - Patient declined   No - Patient declined  Copy of Healthcare Power of Attorney in Chart? Yes - validated most recent copy scanned in chart (See row information) Yes - validated most recent copy scanned in chart (See row information)         Current Medications (verified) Outpatient Encounter Medications as of 07/08/2023  Medication Sig   acetaminophen  (TYLENOL ) 650 MG CR tablet Take 1,950 mg by mouth daily.   aspirin  EC 81 MG tablet Take 81 mg by mouth daily. Swallow whole.   dapagliflozin propanediol (FARXIGA) 10 MG TABS tablet Take 10 mg by mouth daily.   ezetimibe  (ZETIA ) 10 MG tablet TAKE 1 TABLET BY MOUTH DAILY   FLUoxetine  (PROZAC ) 10 MG capsule TAKE 1 CAPSULE BY MOUTH DAILY   fluticasone  (FLONASE ) 50 MCG/ACT nasal spray Place 2 sprays into both nostrils daily. (Patient taking differently: Place 2-3 sprays into both nostrils at bedtime as needed.)   furosemide  (LASIX ) 20 MG tablet TAKE 1 TABLET BY MOUTH DAILY AS  NEEDED FOR EDEMA OR FLUID   Lancets (ONETOUCH ULTRASOFT) lancets Use to check blood sugar daily   levothyroxine  (SYNTHROID ) 125 MCG tablet TAKE 1 TABLET BY MOUTH  DAILY BEFORE BREAKFAST   losartan  (COZAAR ) 100 MG tablet TAKE 1 TABLET BY MOUTH DAILY   metFORMIN  (GLUCOPHAGE -XR) 750 MG 24 hr tablet TAKE 2 TABLETS BY MOUTH DAILY  WITH BREAKFAST   metoprolol  succinate (TOPROL -XL) 25 MG 24 hr tablet TAKE ONE-HALF TABLET BY MOUTH AT BEDTIME  MOUNJARO  2.5 MG/0.5ML Pen Inject 2.5 mg into the skin once a week.   nitroGLYCERIN  (NITROSTAT ) 0.4 MG SL tablet Place 1 tablet (0.4 mg total) under the tongue every 5 (five) minutes as needed for chest pain.   ONETOUCH ULTRA test strip Use to check blood sugar daily   rosuvastatin  (CRESTOR ) 20 MG tablet TAKE 1 TABLET BY MOUTH DAILY   traMADol  (ULTRAM ) 50 MG tablet Take 1 tablet (50 mg total) by  mouth every 6 (six) hours as needed.   [DISCONTINUED] losartan  (COZAAR ) 100 MG tablet TAKE 1 TABLET BY MOUTH DAILY   [DISCONTINUED] metFORMIN  (GLUCOPHAGE -XR) 750 MG 24 hr tablet TAKE 2 TABLETS BY MOUTH DAILY  WITH BREAKFAST   No facility-administered encounter medications on file as of 07/08/2023.    Allergies (verified) Patient has no known allergies.   History: Past Medical History:  Diagnosis Date   Coronary artery disease    a. 07/2013 STEMI/PCI ([place]): LM nl, LAD 100p (thrombectomy & 3.5x23 Alpine DES), LCX nl, RCA nl, EF 45%.   Depression    Diastolic dysfunction    a.) TTE 11/26/2020 - LVEF 50-55%; G2DD.   Endometrial cancer (HCC)    Gravida 3 para 3    Hyperlipidemia    Hypertension    Hypertensive heart disease    Hypothyroid    Ischemic cardiomyopathy    a.) 07/2013 EF 45% @ time of STEMI; b.) 11/2013 Echo: EF 55-60%, mildly dil LA, nl RV fxn. c.) TTE 07/29/2015 - LVEF 55-60%, mild LA and RV dilation. d.) TTE 11/26/2020 - LVEF 50-55%; G2DD.   Menopause    Obstructive sleep apnea on CPAP    Osteoarthritis    PONV (postoperative nausea and vomiting)    ST elevation myocardial infarction (STEMI) of anterolateral wall (HCC) 08/14/2013   a.) LVEF reduced at 45%; Tx'd with trombectomy and PCI to 100% LAD; 3.5 x 23 mm Xience Alpine DES placed.   T2DM (type 2 diabetes mellitus) (HCC)    Thyroid  cancer (HCC)    a.) s/p total thyroidectomy. b.) on daily levothyroxine .   Past Surgical History:  Procedure Laterality Date   CATARACT EXTRACTION W/PHACO Left 08/30/2016   Procedure: CATARACT EXTRACTION PHACO AND INTRAOCULAR LENS PLACEMENT (IOC) Left daibetic;  Surgeon: Annell Kidney, MD;  Location: [facility];  Service: Ophthalmology;  Laterality: Left;  Diabetic - insulin  and oral meds sleep apnea   CATARACT EXTRACTION W/PHACO Right 10/07/2016   Procedure: CATARACT EXTRACTION PHACO AND INTRAOCULAR LENS PLACEMENT (IOC);  Surgeon: Annell Kidney, MD;  Location: [facility]  [facility];  Service: Ophthalmology;  Laterality: Right;  Lot# [id number] H US : 00:43.3 AP%: 12.2 CDE: 5.28   CORONARY ANGIOPLASTY WITH STENT PLACEMENT Left 08/14/2013   Procedure: CORONARY ANGIOPLASTY WITH STENT PLACEMENT (3.5 x 23 mm Xience Alpine DES to LAD); Location: [facility], [place], [place]; Surgeon: Murlean Armour, MD   GANGLION CYST EXCISION Right 1980   IR EXCHANGE BILIARY DRAIN  02/23/2022   IR PERC CHOLECYSTOSTOMY  01/28/2022   IR PERC CHOLECYSTOSTOMY  01/31/2022   LAPAROSCOPIC HYSTERECTOMY     THYROIDECTOMY  1995   total   TOTAL KNEE ARTHROPLASTY Right 12/18/2020   Procedure: RIGHT TOTAL KNEE ARTHROPLASTY;  Surgeon: Rande Bushy, MD;  Location: [facility] OR[facility];  Service: Orthopedics;  Laterality: Right;   TOTAL KNEE ARTHROPLASTY Left 12/10/2021   Procedure: TOTAL KNEE ARTHROPLASTY;  Surgeon: Rande Bushy, MD;  Location: [facility];  Service: Orthopedics;  Laterality: Left;   Family History  Problem Relation Age of Onset   Heart attack  Father    Heart failure Father    Hypertension Father    Hyperlipidemia Father    Heart disease Father 34       CABG    Pulmonary fibrosis Son    Breast cancer Neg Hx    Social History   Socioeconomic History   Marital status: Widowed    Spouse name: Not on file   Number of children: 3   Years of education: Not on file   Highest education level: Bachelor's degree (e.g., BA, AB, BS)  Occupational History   Occupation: retired  Tobacco Use   Smoking status: Never   Smokeless tobacco: Never  Vaping Use   Vaping status: Never Used  Substance and Sexual Activity   Alcohol use: No   Drug use: No   Sexual activity: Not on file  Other Topics Concern   Not on file  Social History Narrative   Not on file   Social Drivers of Health   Financial Resource Strain: Low Risk  (07/08/2023)   Overall Financial Resource Strain (CARDIA)    Difficulty of Paying Living Expenses: Not hard at all  Food Insecurity: No Food Insecurity (07/08/2023)    Hunger Vital Sign    Worried About Running Out of Food in the Last Year: Never true    Ran Out of Food in the Last Year: Never true  Transportation Needs: No Transportation Needs (07/08/2023)   PRAPARE - Administrator, Civil Service (Medical): No    Lack of Transportation (Non-Medical): No  Physical Activity: Insufficiently Active (07/08/2023)   Exercise Vital Sign    Days of Exercise per Week: 2 days    Minutes of Exercise per Session: 20 min  Stress: No Stress Concern Present (07/08/2023)   [employer] of Occupational Health - Occupational Stress Questionnaire    Feeling of Stress : Only a little  Social Connections: Moderately Isolated (07/08/2023)   Social Connection and Isolation Panel [NHANES]    Frequency of Communication with Friends and Family: More than three times a week    Frequency of Social Gatherings with Friends and Family: More than three times a week    Attends Religious Services: Never    Database administrator or Organizations: Yes    Attends Engineer, structural: More than 4 times per year    Marital Status: Widowed    Tobacco Counseling Counseling given: Not Answered    Clinical Intake:  Pre-visit preparation completed: Yes  Pain : No/denies pain     BMI - recorded: 38.4 Nutritional Status: BMI > 30  Obese Nutritional Risks: None Diabetes: Yes CBG done?: No Did pt. bring in CBG monitor from home?: No  Lab Results  Component Value Date   HGBA1C 10.9 (A) 06/13/2023   HGBA1C 9.2 (H) 12/06/2022   HGBA1C 7.6 (A) 08/02/2022     How often do you need to have someone help you when you read instructions, pamphlets, or other written materials from your doctor or pharmacy?: 1 - Never  Interpreter Needed?: No  Information entered by :: Dellie Fergusson, LPN   Activities of Daily Living    07/08/2023   10:58 AM 07/07/2023    9:44 AM  In your present state of health, do you have any difficulty performing the following activities:   Hearing? 0 0  Vision? 0 0  Difficulty concentrating or making decisions? 0 0  Walking or climbing stairs? 0 0  Dressing or bathing? 0 0  Doing errands, shopping?  0 0  Preparing Food and eating ? N N  Using the Toilet? N N  In the past six months, have you accidently leaked urine? N N  Do you have problems with loss of bowel control? N N  Managing your Medications? N N  Managing your Finances? N N  Housekeeping or managing your Housekeeping? N N    Patient Care Team: Raina Bunting, DO as PCP - General (Family Medicine) Jerelene Monday Deadra Everts, MD as PCP - Cardiology (Cardiology) Annell Kidney, MD as Referring Physician (Ophthalmology) Devorah Fonder, MD as Consulting Physician (Cardiology)  Indicate any recent Medical Services you may have received from other than [facility] providers in the past year (date may be approximate).     Assessment:    This is a routine wellness examination for [name].  Hearing/Vision screen Hearing Screening - Comments:: NO AIDS- NEEDS SOME Vision Screening - Comments:: WEARS GLASSES ALL DAY- HAD CATARACT SGY- DR.BRASINGTON   Goals Addressed             This Visit's Progress    DIET - REDUCE SUGAR INTAKE         Depression Screen     07/08/2023   10:56 AM 06/13/2023    1:20 PM 12/13/2022    1:20 PM 08/02/2022    6:03 PM 07/02/2022   12:21 PM 04/30/2022   11:10 AM 06/11/2021    8:44 AM  PHQ 2/9 Scores  PHQ - 2 Score 0 0 2 2 4 2  0  PHQ- 9 Score 0 3 8 6 10 3 3     Fall Risk     07/08/2023   10:58 AM 07/07/2023    9:44 AM 06/13/2023    1:20 PM 12/13/2022    1:21 PM 07/02/2022   12:23 PM  Fall Risk   Falls in the past year? 0 0 0 0 0  Number falls in past yr: 0    0  Injury with Fall? 0 0  0 0  Risk for fall due to : No Fall Risks    No Fall Risks  Follow up Falls prevention discussed;Falls evaluation completed    Falls prevention discussed;Falls evaluation completed    MEDICARE RISK AT HOME:  Medicare Risk at Home Any stairs  in or around the home?: Yes If so, are there any without handrails?: Yes Home free of loose throw rugs in walkways, pet beds, electrical cords, etc?: Yes Adequate lighting in your home to reduce risk of falls?: Yes Life alert?: No Use of a cane, walker or w/c?: No Grab bars in the bathroom?: Yes Shower chair or bench in shower?: Yes Elevated toilet seat or a handicapped toilet?: No  TIMED UP AND GO:  Was the test performed?  No  Cognitive Function: 6CIT completed        07/08/2023   10:59 AM 07/02/2022   12:29 PM 02/12/2020    9:43 AM 11/30/2016    1:47 PM  6CIT Screen  What Year?  0 points 0 points 0 points  What month?  0 points 0 points 0 points  What time? 0 points 0 points 0 points 0 points  Count back from 20 0 points 0 points 0 points 0 points  Months in reverse 0 points 0 points 0 points 0 points  Repeat phrase 2 points 0 points 0 points 0 points  Total Score  0 points 0 points 0 points    Immunizations Immunization History  Administered Date(s) Administered   Fluad Quad(high  Dose 65+) 12/19/2020, 12/11/2021   Influenza Split 12/29/2011   Influenza, High Dose Seasonal PF 11/19/2014, 12/11/2015, 11/30/2016, 12/16/2017, 12/13/2022   Influenza,inj,Quad PF,6+ Mos 12/20/2012, 12/05/2018   Influenza-Unspecified 12/14/2019, 12/13/2022   PFIZER(Purple Top)SARS-COV-2 Vaccination 05/02/2019, 05/23/2019, 11/26/2019   PPD Test 01/19/2016   Pfizer(Comirnaty)Fall Seasonal Vaccine 12 years and older 12/13/2022   Pneumococcal Conjugate-13 01/18/2014   Pneumococcal Polysaccharide-23 07/30/2004, 12/20/2012   Tdap 08/02/2013    Screening Tests Health Maintenance  Topic Date Due   Zoster Vaccines- Shingrix (1 of 2) Never done   COVID-19 Vaccine (5 - Pfizer risk 2024-25 season) 06/13/2023   OPHTHALMOLOGY EXAM  07/23/2023   DTaP/Tdap/Td (2 - Td or Tdap) 08/03/2023   INFLUENZA VACCINE  09/30/2023   Diabetic kidney evaluation - eGFR measurement  12/06/2023   FOOT EXAM  12/13/2023    HEMOGLOBIN A1C  12/13/2023   Diabetic kidney evaluation - Urine ACR  06/07/2024   Medicare Annual Wellness (AWV)  07/07/2024   Pneumonia Vaccine 74+ Years old  Completed   DEXA SCAN  Completed   Hepatitis C Screening  Completed   HPV VACCINES  Aged Out   Meningococcal B Vaccine  Aged Out   Fecal DNA (Cologuard)  Discontinued    Health Maintenance  Health Maintenance Due  Topic Date Due   Zoster Vaccines- Shingrix (1 of 2) Never done   COVID-19 Vaccine (5 - Pfizer risk 2024-25 season) 06/13/2023   Health Maintenance Items Addressed: DEXA ordered; SHOTS UP TO DATE EXCEPT SHINGRIX; FAXED [facility] FOR LATEST EYE EXAM   Additional Screening:  Vision Screening: Recommended annual ophthalmology exams for early detection of glaucoma and other disorders of the eye.  Dental Screening: Recommended annual dental exams for proper oral hygiene  Community Resource Referral / Chronic Care Management: CRR required this visit?  No   CCM required this visit?  No   Plan:    I have personally reviewed and noted the following in the patient's chart:   Medical and social history Use of alcohol, tobacco or illicit drugs  Current medications and supplements including opioid prescriptions. Patient is not currently taking opioid prescriptions. Functional ability and status Nutritional status Physical activity Advanced directives List of other physicians Hospitalizations, surgeries, and ER visits in previous 12 months Vitals Screenings to include cognitive, depression, and falls Referrals and appointments  In addition, I have reviewed and discussed with patient certain preventive protocols, quality metrics, and best practice recommendations. A written personalized care plan for preventive services as well as general preventive health recommendations were provided to patient.   Pinky Bright, LPN   10/03/1322   After Visit Summary: (MyChart) Due to this being a telephonic visit, the  after visit summary with patients personalized plan was offered to patient via MyChart   Notes: DEXA ORDERED

## 2023-07-11 ENCOUNTER — Other Ambulatory Visit: Payer: Self-pay | Admitting: Family Medicine

## 2023-07-11 DIAGNOSIS — E1121 Type 2 diabetes mellitus with diabetic nephropathy: Secondary | ICD-10-CM

## 2023-07-13 MED ORDER — MOUNJARO 5 MG/0.5ML ~~LOC~~ SOAJ: 5.0000 mg | SUBCUTANEOUS | 2 refills | Status: DC

## 2023-07-14 ENCOUNTER — Other Ambulatory Visit: Payer: Self-pay

## 2023-07-14 ENCOUNTER — Telehealth: Payer: Self-pay

## 2023-07-14 DIAGNOSIS — E1121 Type 2 diabetes mellitus with diabetic nephropathy: Secondary | ICD-10-CM

## 2023-07-14 MED ORDER — MOUNJARO 5 MG/0.5ML ~~LOC~~ SOAJ: 5.0000 mg | SUBCUTANEOUS | 2 refills | Status: DC

## 2023-07-14 NOTE — Telephone Encounter (Signed)
 Spoke with patient, she will come by and get a sample box tomorrow to help until the pharmacy gets her prescription.

## 2023-07-14 NOTE — Telephone Encounter (Signed)
 She said she couldn't get the Mounjaro  from [facility], We received a fax from [facility] that they do not have this either. Any suggestions?

## 2023-07-14 NOTE — Telephone Encounter (Signed)
 Do you have the Mounjaro  drug rep card / phone #? I would contact them first and see if they can find out what the issue is if it is supply or back order or just that particular location.  I think our sample is only 2.5mg  dose here, so ideally we can find the 5mg  somewhere else. I have not heard of other patients with this issue.  Domingo Friend, DO [facility] 07/14/2023, 3:48 PM

## 2023-07-14 NOTE — Telephone Encounter (Signed)
 Left message for patient to return call Ok to give sample box until she can get from pharmacy per Dr. Romeo Co.

## 2023-07-20 DIAGNOSIS — H905 Unspecified sensorineural hearing loss: Secondary | ICD-10-CM | POA: Diagnosis not present

## 2023-07-25 ENCOUNTER — Other Ambulatory Visit: Payer: Self-pay | Admitting: Cardiovascular Disease

## 2023-07-25 DIAGNOSIS — E1169 Type 2 diabetes mellitus with other specified complication: Secondary | ICD-10-CM

## 2023-08-25 DIAGNOSIS — Z961 Presence of intraocular lens: Secondary | ICD-10-CM | POA: Diagnosis not present

## 2023-08-25 DIAGNOSIS — H401131 Primary open-angle glaucoma, bilateral, mild stage: Secondary | ICD-10-CM | POA: Diagnosis not present

## 2023-08-25 DIAGNOSIS — E113293 Type 2 diabetes mellitus with mild nonproliferative diabetic retinopathy without macular edema, bilateral: Secondary | ICD-10-CM | POA: Diagnosis not present

## 2023-08-25 DIAGNOSIS — H43813 Vitreous degeneration, bilateral: Secondary | ICD-10-CM | POA: Diagnosis not present

## 2023-08-25 LAB — HM DIABETES EYE EXAM

## 2023-08-29 ENCOUNTER — Encounter: Payer: Self-pay | Admitting: Family Medicine

## 2023-09-20 ENCOUNTER — Other Ambulatory Visit: Payer: Self-pay | Admitting: Family Medicine

## 2023-09-20 ENCOUNTER — Encounter: Payer: Self-pay | Admitting: Family Medicine

## 2023-09-20 ENCOUNTER — Ambulatory Visit (INDEPENDENT_AMBULATORY_CARE_PROVIDER_SITE_OTHER): Admitting: Family Medicine

## 2023-09-20 VITALS — BP 122/50 | HR 58 | Ht 66.0 in | Wt 229.2 lb

## 2023-09-20 DIAGNOSIS — G4733 Obstructive sleep apnea (adult) (pediatric): Secondary | ICD-10-CM

## 2023-09-20 DIAGNOSIS — E890 Postprocedural hypothyroidism: Secondary | ICD-10-CM

## 2023-09-20 DIAGNOSIS — I129 Hypertensive chronic kidney disease with stage 1 through stage 4 chronic kidney disease, or unspecified chronic kidney disease: Secondary | ICD-10-CM

## 2023-09-20 DIAGNOSIS — Z7985 Long-term (current) use of injectable non-insulin antidiabetic drugs: Secondary | ICD-10-CM | POA: Diagnosis not present

## 2023-09-20 DIAGNOSIS — N183 Chronic kidney disease, stage 3 unspecified: Secondary | ICD-10-CM | POA: Diagnosis not present

## 2023-09-20 DIAGNOSIS — E1121 Type 2 diabetes mellitus with diabetic nephropathy: Secondary | ICD-10-CM | POA: Diagnosis not present

## 2023-09-20 DIAGNOSIS — Z0000 Encounter for general adult medical examination without abnormal findings: Secondary | ICD-10-CM

## 2023-09-20 DIAGNOSIS — E1169 Type 2 diabetes mellitus with other specified complication: Secondary | ICD-10-CM

## 2023-09-20 LAB — POCT GLYCOSYLATED HEMOGLOBIN (HGB A1C): Hemoglobin A1C: 7.7 % — AB (ref 4.0–5.6)

## 2023-09-20 NOTE — Patient Instructions (Addendum)
 Thank you for coming to the office today.  Use free sample Freestyle Libre 10-14 day sample.  Then if you like, can purchase the Stelo (by [facility])  https://www.stelo.com/en-us /how-it-works  $80-100 per month, check into subscription plan.  [facility] Address: [address], [place], [place] [address] Phone: [phone] Fax: [phone]  DUE for FASTING BLOOD WORK (no food or drink after midnight before the lab appointment, only water  or coffee without cream/sugar on the morning of)  SCHEDULE Lab Only visit in the morning at the clinic for lab draw in 3 MONTHS   - Make sure Lab Only appointment is at about 1 week before your next appointment, so that results will be available  For Lab Results, once available within 2-3 days of blood draw, you can can log in to MyChart online to view your results and a brief explanation. Also, we can discuss results at next follow-up visit.   Please schedule a Follow-up Appointment to: Return in about 3 months (around 12/21/2023) for 3 month fasting lab > 1 week later Annual Physical.  If you have any other questions or concerns, please feel free to call the office or send a message through MyChart. You may also schedule an earlier appointment if necessary.  Additionally, you may be receiving a survey about your experience at our office within a few days to 1 week by e-mail or mail. We value your feedback.  Marsa Officer, DO [facility], [place]

## 2023-09-20 NOTE — Addendum Note (Signed)
 Addended by: EDMAN MARSA PARAS on: 09/20/2023 06:47 PM   Modules accepted: Orders

## 2023-09-20 NOTE — Progress Notes (Addendum)
 Subjective:    Patient ID: [name], female    DOB: 19-Feb-1946, 78 y.o.   MRN: [id number]  [name] is a 78 y.o. female presenting on 09/20/2023 for Medical Management of Chronic Issues and Diabetes   HPI  Discussed the use of AI scribe software for clinical note transcription with the patient, who gave verbal consent to proceed.  History of Present Illness   [name] is a 78 year old female with type 2 diabetes who presents for follow-up regarding her diabetes management.  Side effect on GLP1 Gastrointestinal adverse effects - Severe gas and vomiting, particularly at night, following administration of Mounjaro  (tirzepatide ) 5 mg - Symptoms begin a few days after taking Mounjaro  and resolve within a few days of discontinuation - Symptoms are more pronounced compared to prior use of Ozempic  (semaglutide ), which was well tolerated - Persistence of gastrointestinal symptoms despite use of the lowest dose of Mounjaro  on two separate occasions, leading to discontinuation of the medication - No current use of Mounjaro  for the past two weeks, with improvement in symptoms during this period - History of cholecystectomy; patient questions possible contribution to gastrointestinal symptoms  Type 2 Diabetes Today A1c 7.7 down from 10.9 Off Mounjaro  - Current medications: Farxiga (dapagliflozin) 10 mg daily, Metformin  750 mg = 1500mg  XR - No current use of Mounjaro  for two weeks due to adverse effects - Inconsistent blood glucose monitoring due to difficulty with finger-prick testing - Difficulty maintaining regular blood glucose monitoring due to inconvenience of finger-prick tests  Obstructive sleep apnea - Uses CPAP machine for management of obstructive sleep apnea - Current issues with CPAP mask losing air, requiring new tubing and mask     Denies hypoglycemia, polyuria, visual changes, numbness or tingling.   Hypothyroidism Controlled Currently on  Levothyroxine  125 mcg daily She is doing well overall on this dose.   Elevated PTH Managed by Nephrology   HYPERTENSION CKD III  Reports BP controlled Stable Creatinine 1.05 Current Meds - Metoprolol  12.5mg  XL daily (half of 25), Losartan  100mg  daily   Reports good compliance, took meds today. Tolerating well, w/o complaints. Denies CP, dyspnea, HA, edema, dizziness / lightheadedness    Osteoarthritis / Chronic Pain The patient occasionally uses tramadol  for joint pain, particularly before participating in activities    OSA on CPAP Needs nwe order CPAP tubing, mask, supplies Rx supplies to [employer]      09/20/2023    1:23 PM 09/20/2023    1:22 PM 07/08/2023   10:56 AM  Depression screen PHQ 2/9  Decreased Interest 0 0 0  Down, Depressed, Hopeless 0 0 0  PHQ - 2 Score 0 0 0  Altered sleeping 0  0  Tired, decreased energy 1  0  Change in appetite 1  0  Feeling bad or failure about yourself  0  0  Trouble concentrating 0  0  Moving slowly or fidgety/restless 0  0  Suicidal thoughts 0  0  PHQ-9 Score 2  0  Difficult doing work/chores   Not difficult at all       09/20/2023    1:23 PM 06/13/2023    1:20 PM 12/13/2022    1:20 PM 04/30/2022   11:10 AM  GAD 7 : Generalized Anxiety Score  Nervous, Anxious, on Edge 0 0 0 0  Control/stop worrying 0 0 0 0  Worry too much - different things 0 0 0 0  Trouble relaxing 0 0 0 0  Restless 0  0 0 0  Easily annoyed or irritable 0 0 0 0  Afraid - awful might happen 0 0 0 0  Total GAD 7 Score 0 0 0 0  Anxiety Difficulty    Not difficult at all    Social History   Tobacco Use  . Smoking status: Never  . Smokeless tobacco: Never  Vaping Use  . Vaping status: Never Used  Substance Use Topics  . Alcohol use: No  . Drug use: No    Review of Systems Per HPI unless specifically indicated above     Objective:    BP (!) 122/50 (BP Location: Left Arm, Patient Position: Sitting, Cuff Size: Normal)   Pulse (!) 58   Ht 5' 6 (1.676  m)   Wt 229 lb 4 oz (104 kg)   SpO2 99%   BMI 37.00 kg/m   Wt Readings from Last 3 Encounters:  09/20/23 229 lb 4 oz (104 kg)  06/13/23 238 lb 6 oz (108.1 kg)  12/13/22 238 lb 9.6 oz (108.2 kg)    Physical Exam Vitals and nursing note reviewed.  Constitutional:      General: She is not in acute distress.    Appearance: She is well-developed. She is not diaphoretic.     Comments: Well-appearing, comfortable, cooperative  HENT:     Head: Normocephalic and atraumatic.  Eyes:     General:        Right eye: No discharge.        Left eye: No discharge.     Conjunctiva/sclera: Conjunctivae normal.  Neck:     Thyroid : No thyromegaly.  Cardiovascular:     Rate and Rhythm: Normal rate and regular rhythm.     Heart sounds: Normal heart sounds. No murmur heard. Pulmonary:     Effort: Pulmonary effort is normal. No respiratory distress.     Breath sounds: Normal breath sounds. No wheezing or rales.  Musculoskeletal:        General: Normal range of motion.     Cervical back: Normal range of motion and neck supple.  Lymphadenopathy:     Cervical: No cervical adenopathy.  Skin:    General: Skin is warm and dry.     Findings: No erythema or rash.  Neurological:     Mental Status: She is alert and oriented to person, place, and time.  Psychiatric:        Behavior: Behavior normal.     Comments: Well groomed, good eye contact, normal speech and thoughts     Results for orders placed or performed in visit on 09/20/23  POCT HgB A1C   Collection Time: 09/20/23  1:26 PM  Result Value Ref Range   Hemoglobin A1C 7.7 (A) 4.0 - 5.6 %   HbA1c POC (<> result, manual entry)     HbA1c, POC (prediabetic range)     HbA1c, POC (controlled diabetic range)        Assessment & Plan:   Problem List Items Addressed This Visit     Benign hypertension with CKD (chronic kidney disease) stage III (HCC)   Controlled type 2 diabetes mellitus with diabetic nephropathy (HCC) - Primary   Relevant  Orders   POCT HgB A1C (Completed)   Hypothyroidism, postop   Obstructive sleep apnea on CPAP   Relevant Orders   For home use only DME continuous positive airway pressure (CPAP)   Other Visit Diagnoses       Long-term current use of injectable noninsulin antidiabetic medication  Type 2 Diabetes Mellitus A1c improved to 7.7. Mounjaro  causes intolerable gastrointestinal side effects. Previously tolerated Ozempic . Interested in CGM for better glucose management. Discussed Freestyle Libre sample today and Stelo CGM options. Emphasized dietary modifications. Hopeful that [name] helps manage her diabetes given this is relatively new  - Discontinue Mounjaro  5 mg. - Continue Metformin  750 mg extended-release, 1500 mg daily. - Continue Farxiga 10 mg daily. - Provide Freestyle Libre CGM sample for trial. - Educate on purchasing Stelo CGM. - Encourage CGM use for glucose monitoring. - Discuss dietary modifications.  Obstructive Sleep Apnea Current CPAP mask causes air leaks, requiring replacement. - Send prescription for CPAP tubing and mask to [facility].  General Health Maintenance Due for annual examination in October. Scheduled routine lab tests. - Schedule annual examination for October. - Order routine lab tests prior to annual examination.       Orders Placed This Encounter  Procedures  . For home use only DME continuous positive airway pressure (CPAP)    OSA on CPAP already. Needs supplies ONLY, tubing, masks. She has CPAP machine. Fax to [facility] [phone]    Length of Need:   Lifetime    Patient has OSA or probable OSA:   Yes    Is the patient currently using CPAP in the home:   Yes    If yes (to question two):   Determine DME provider and inform them of any new orders/settings    Settings:   11-15    CPAP supplies needed:   Mask, headgear, cushions, filters, heated tubing and water  chamber  . POCT HgB A1C    No orders of the  defined types were placed in this encounter.   Follow up plan: Return in about 3 months (around 12/21/2023) for 3 month fasting lab > 1 week later Annual Physical.  Future labs ordered for 12/22/23 lab orders   Marsa Officer, DO [facility] 09/20/2023, 1:33 PM

## 2023-09-21 ENCOUNTER — Ambulatory Visit: Admitting: Family Medicine

## 2023-09-22 DIAGNOSIS — H401131 Primary open-angle glaucoma, bilateral, mild stage: Secondary | ICD-10-CM | POA: Diagnosis not present

## 2023-09-23 ENCOUNTER — Other Ambulatory Visit: Payer: Self-pay | Admitting: Family Medicine

## 2023-09-23 DIAGNOSIS — I129 Hypertensive chronic kidney disease with stage 1 through stage 4 chronic kidney disease, or unspecified chronic kidney disease: Secondary | ICD-10-CM

## 2023-09-23 DIAGNOSIS — E890 Postprocedural hypothyroidism: Secondary | ICD-10-CM

## 2023-09-26 NOTE — Telephone Encounter (Signed)
 Requested Prescriptions  Refused Prescriptions Disp Refills   metoprolol  succinate (TOPROL -XL) 25 MG 24 hr tablet [Pharmacy Med Name: Metoprolol  Succinate ER 25 MG Oral Tablet Extended Release 24 Hour] 50 tablet 2    Sig: TAKE ONE-HALF TABLET BY MOUTH AT BEDTIME     Cardiovascular:  Beta Blockers Passed - 09/26/2023  1:30 PM      Passed - Last BP in normal range    BP Readings from Last 1 Encounters:  09/20/23 (!) 122/50         Passed - Last Heart Rate in normal range    Pulse Readings from Last 1 Encounters:  09/20/23 (!) 58         Passed - Valid encounter within last 6 months    Recent Outpatient Visits           6 days ago Controlled type 2 diabetes mellitus with diabetic nephropathy, without long-term current use of insulin  (HCC)   [facility] [place], Marsa PARAS, DO   3 months ago Controlled type 2 diabetes mellitus with diabetic nephropathy, without long-term current use of insulin  [facility])   [place] [place], Marsa PARAS, DO       Future Appointments             In 4 weeks Gollan, Evalene PARAS, MD [facility]             levothyroxine  (SYNTHROID ) 125 MCG tablet [Pharmacy Med Name: ALVOGEN-LEVOTHYROXINE  TAB 125MCG] 100 tablet 2    Sig: TAKE 1 TABLET BY MOUTH DAILY  BEFORE BREAKFAST     Endocrinology:  Hypothyroid Agents Failed - 09/26/2023  1:30 PM      Failed - TSH in normal range and within 360 days    TSH  Date Value Ref Range Status  12/06/2022 4.89 (H) 0.40 - 4.50 mIU/L Final         Passed - Valid encounter within last 12 months    Recent Outpatient Visits           6 days ago Controlled type 2 diabetes mellitus with diabetic nephropathy, without long-term current use of insulin  [facility])   [place] [facility] [place], Marsa PARAS, DO   3 months ago Controlled type 2 diabetes mellitus with diabetic nephropathy, without long-term current use of  insulin  [facility])   [place] [facility] [place], Marsa PARAS, DO       Future Appointments             In 4 weeks Gollan, Timothy J, MD [facility]

## 2023-10-18 ENCOUNTER — Other Ambulatory Visit: Payer: Self-pay | Admitting: Cardiovascular Disease

## 2023-10-18 DIAGNOSIS — E1169 Type 2 diabetes mellitus with other specified complication: Secondary | ICD-10-CM

## 2023-10-24 NOTE — Progress Notes (Unsigned)
 Cardiology Office Note  Date:  10/25/2023   ID:  [name], [name] Jan 03, 1946, MRN [id number]  PCP:  Edman Marsa PARAS, DO   Chief Complaint  Patient presents with   12 month follow up     Doing well.     HPI:  Ms. Loveridge is a pleasant 78 year old woman with past medical history of CAD June 2015, anterior ST elevation MI in [place]   thrombectomy and drug-eluting stent placement to the LAD.  EF 45%  echocardiogram in October 2015 showed normalization of LV function, at 55-60%.  hypertension, hyperlipidemia,  diabetes morbid obesity son passed in December 2016. She presents for follow-up of her coronary artery disease,DVT  Last seen by myself in clinic 4/24 In follow-up reports that she is doing well Denies chest pain or shortness of breath concerning for angina Starting exercise 3x a week at the [employer] center Tries to stay active  Reports blood pressure well-controlled Concerned pulse low, has Apple Watch but is not tracking rate  No significant lower extremity swelling, no PND orthopnea  Lab work reviewed A1c 7.7 down from 10.9 (she does not know why it ran up high) Total cholesterol 112 LDL 32  EKG personally reviewed by myself on todays visit EKG Interpretation Date/Time:  Tuesday October 25 2023 08:27:57 EDT Ventricular Rate:  52 PR Interval:  174 QRS Duration:  90 QT Interval:  448 QTC Calculation: 416 R Axis:   -23  Text Interpretation: Sinus bradycardia Low voltage QRS Cannot rule out Anterior infarct (cited on or before 08-Mar-2022) When compared with ECG of 25-Oct-2023 08:24, No significant change was found Confirmed by Perla Lye [phone]) on 10/25/2023 8:34:54 AM   Other past medical history  Admitted to the hospital 01/27/22 - 02/06/22 with septic shock secondary to cholecystitis/gallstones and NSTEMI. Heparin  drip was started for her NSTEMI, which was felt to be secondary to her sepsis.   Echo revealed EF 60-65% grade I  DD.  Myoview  showed findings are consistent with no ischemia and no prior myocardial infarction, low risk study.  underwent percutaneous cholecystostomy stent that later had to be replaced d/t abnormal positioning. She developed AKI on CKD, requiring pressor support.  LE venous doppler was positive for acute occlusive DVT in the left posterior tibial vein and she was eventually discharged on Eliquis .    Following discharge developed CHF  IVC filter placed prior to removing her gallbladder.  Drain replaced February 24, 2022 In th er 03/09/22: Fever, pain right side of abdomen, biliary drain placed to suction  03/26/22: gall bladder out Then low grade fevers: abscess noted x3 Back in hospital, on ABX IV, 6 days , then home on ABX Finished last week,   Daughter died,ovarian cancer  since February 19, 2022 Spiking fever , malaise, hives Seen in [facility] ER 01/22/22 Did not check for covid, they focused on nontrending troponin averaging 70-80 Fever at home 103.8, taking Tylenol  around-the-clock, sometimes NSAIDs   PMH:   has a past medical history of Coronary artery disease, Depression, Diastolic dysfunction, Endometrial cancer (HCC), Gravida 3 para 3, Hyperlipidemia, Hypertension, Hypertensive heart disease, Hypothyroid, Ischemic cardiomyopathy, Menopause, Obstructive sleep apnea on CPAP, Osteoarthritis, PONV (postoperative nausea and vomiting), ST elevation myocardial infarction (STEMI) of anterolateral wall (HCC) (08/14/2013), T2DM (type 2 diabetes mellitus) (HCC), and Thyroid  cancer (HCC).  PSH:    Past Surgical History:  Procedure Laterality Date   CATARACT EXTRACTION W/PHACO Left 08/30/2016   Procedure: CATARACT EXTRACTION PHACO AND INTRAOCULAR LENS PLACEMENT (IOC) Left daibetic;  Surgeon: Mittie Gaskin, MD;  Location[facility];  Service: Ophthalmology;  Laterality: Left;  Diabetic - insulin  and oral meds sleep apnea   CATARACT EXTRACTION W/PHACO Right 10/07/2016   Procedure:  CATARACT EXTRACTION PHACO AND INTRAOCULAR LENS PLACEMENT (IOC);  Surgeon: Mittie Gaskin, MD;  Location: [facility];  Service: Ophthalmology;  Laterality: Right;  Lot# [id number] H US : 00:43.3 AP%: 12.2 CDE: 5.28   CORONARY ANGIOPLASTY WITH STENT PLACEMENT Left 08/14/2013   Procedure: CORONARY ANGIOPLASTY WITH STENT PLACEMENT (3.5 x 23 mm Xience Alpine DES to LAD); Location: [facility], [place], [place]; Surgeon: Dale Pour, MD   GANGLION CYST EXCISION Right 1980   IR EXCHANGE BILIARY DRAIN  02/23/2022   IR PERC CHOLECYSTOSTOMY  01/28/2022   IR PERC CHOLECYSTOSTOMY  01/31/2022   LAPAROSCOPIC HYSTERECTOMY     THYROIDECTOMY  1995   total   TOTAL KNEE ARTHROPLASTY Right 12/18/2020   Procedure: RIGHT TOTAL KNEE ARTHROPLASTY;  Surgeon: Marchia Drivers, MD;  Location: [facility];  Service: Orthopedics;  Laterality: Right;   TOTAL KNEE ARTHROPLASTY Left 12/10/2021   Procedure: TOTAL KNEE ARTHROPLASTY;  Surgeon: Marchia Drivers, MD;  Location: [facility];  Service: Orthopedics;  Laterality: Left;    Current Outpatient Medications  Medication Sig Dispense Refill   acetaminophen  (TYLENOL ) 650 MG CR tablet Take 1,950 mg by mouth daily.     aspirin  EC 81 MG tablet Take 81 mg by mouth daily. Swallow whole.     dapagliflozin propanediol (FARXIGA) 10 MG TABS tablet Take 10 mg by mouth daily.     ezetimibe  (ZETIA ) 10 MG tablet TAKE 1 TABLET BY MOUTH DAILY 90 tablet 3   fluticasone  (FLONASE ) 50 MCG/ACT nasal spray Place 2 sprays into both nostrils daily. 16 g 0   furosemide  (LASIX ) 20 MG tablet TAKE 1 TABLET BY MOUTH DAILY AS  NEEDED FOR EDEMA OR FLUID 90 tablet 2   Lancets (ONETOUCH ULTRASOFT) lancets Use to check blood sugar daily 100 each 3   levothyroxine  (SYNTHROID ) 125 MCG tablet TAKE 1 TABLET BY MOUTH  DAILY BEFORE BREAKFAST 90 tablet 3   losartan  (COZAAR ) 100 MG tablet TAKE 1 TABLET BY MOUTH DAILY 100 tablet 1   metFORMIN  (GLUCOPHAGE -XR) 750 MG 24 hr tablet TAKE 2 TABLETS BY MOUTH DAILY   WITH BREAKFAST 200 tablet 1   metoprolol  succinate (TOPROL -XL) 25 MG 24 hr tablet TAKE ONE-HALF TABLET BY MOUTH AT BEDTIME 45 tablet 3   nitroGLYCERIN  (NITROSTAT ) 0.4 MG SL tablet Place 1 tablet (0.4 mg total) under the tongue every 5 (five) minutes as needed for chest pain. 30 tablet 11   ONETOUCH ULTRA test strip Use to check blood sugar daily 100 each 3   rosuvastatin  (CRESTOR ) 20 MG tablet TAKE 1 TABLET BY MOUTH DAILY 90 tablet 0   traMADol  (ULTRAM ) 50 MG tablet Take 1 tablet (50 mg total) by mouth every 6 (six) hours as needed. 30 tablet 0   FLUoxetine  (PROZAC ) 10 MG capsule TAKE 1 CAPSULE BY MOUTH DAILY (Patient not taking: Reported on 10/25/2023) 90 capsule 3   No current facility-administered medications for this visit.    Allergies:   Patient has no known allergies.   Social History:  The patient  reports that she has never smoked. She has never used smokeless tobacco. She reports that she does not drink alcohol and does not use drugs.   Family History:   family history includes Heart attack in her father; Heart disease (age of onset: 24) in her father; Heart failure in her father;  Hyperlipidemia in her father; Hypertension in her father; Pulmonary fibrosis in her son.    Review of Systems: Review of Systems  Constitutional: Negative.   HENT: Negative.    Respiratory: Negative.    Cardiovascular: Negative.   Gastrointestinal: Negative.   Musculoskeletal: Negative.   Neurological: Negative.   Psychiatric/Behavioral: Negative.    All other systems reviewed and are negative.  PHYSICAL EXAM: VS:  BP 120/62 (BP Location: Left Arm, Patient Position: Sitting, Cuff Size: Normal)   Ht 5' 6 (1.676 m)   Wt 224 lb 4 oz (101.7 kg)   SpO2 98%   BMI 36.19 kg/m  , BMI Body mass index is 36.19 kg/m. Constitutional:  oriented to person, place, and time. No distress.  HENT:  Head: Grossly normal Eyes:  no discharge. No scleral icterus.  Neck: No JVD, no carotid bruits   Cardiovascular: Regular rate and rhythm, no murmurs appreciated Pulmonary/Chest: Clear to auscultation bilaterally, no wheezes or rales Abdominal: Soft.  no distension.  no tenderness.  Musculoskeletal: Normal range of motion Neurological:  normal muscle tone. Coordination normal. No atrophy Skin: Skin warm and dry Psychiatric: normal affect, pleasant   Recent Labs: 12/06/2022: ALT 12; BUN 25; Creat 1.01; Hemoglobin 11.5; Platelets 201; Potassium 4.7; Sodium 139; TSH 4.89    Lipid Panel Lab Results  Component Value Date   CHOL 112 12/06/2022   HDL 62 12/06/2022   LDLCALC 32 12/06/2022   TRIG 94 12/06/2022     Wt Readings from Last 3 Encounters:  10/25/23 224 lb 4 oz (101.7 kg)  09/20/23 229 lb 4 oz (104 kg)  06/13/23 238 lb 6 oz (108.1 kg)     ASSESSMENT AND PLAN:  Coronary artery disease of native artery of native heart with stable angina pectoris (HCC) -  Currently with no symptoms of angina. No further workup at this time. Continue current medication regimen.  Stressed importance of aggressive diabetes control  Essential (primary) hypertension - Blood pressure is well controlled on today's visit. No changes made to the medications.  Mixed hyperlipidemia -  on Crestor  Zetia , cholesterol at goal  Type 2 diabetes mellitus with other circulatory complication, with long-term current use of insulin  (HCC) - A1C more than 10 down to 7.7 Off Ozempic , taking Farxiga and metformin  Stressed the importance of low carbohydrate diet, exercise program  Chronic kidney disease (CKD) stage G3a/A1,  Stable numbers Followed by primary care, numbers stable, look good  Obstructive sleep apnea on CPAP Compliant with her cpap (old machine?)  Obesity We have encouraged continued exercise, careful diet management in an effort to lose weight.  Ischemic cardiomyopathy - EF 60 to 65%, fully recovered from September 2022, at that time 50 to 55% Appears euvolemic  DVT: Completed  eliquis  6 months    Orders Placed This Encounter  Procedures   EKG 12-Lead     Signed, Velinda Lunger, M.D., Ph.D. 10/25/2023  [facility] [place], [place] [phone]

## 2023-10-25 ENCOUNTER — Encounter: Payer: Self-pay | Admitting: Cardiovascular Disease

## 2023-10-25 ENCOUNTER — Ambulatory Visit: Attending: Cardiovascular Disease | Admitting: Cardiovascular Disease

## 2023-10-25 VITALS — BP 120/62 | HR 54 | Ht 66.0 in | Wt 224.2 lb

## 2023-10-25 DIAGNOSIS — I5032 Chronic diastolic (congestive) heart failure: Secondary | ICD-10-CM

## 2023-10-25 DIAGNOSIS — E785 Hyperlipidemia, unspecified: Secondary | ICD-10-CM

## 2023-10-25 DIAGNOSIS — N1831 Chronic kidney disease, stage 3a: Secondary | ICD-10-CM

## 2023-10-25 DIAGNOSIS — I25118 Atherosclerotic heart disease of native coronary artery with other forms of angina pectoris: Secondary | ICD-10-CM

## 2023-10-25 DIAGNOSIS — I10 Essential (primary) hypertension: Secondary | ICD-10-CM | POA: Diagnosis not present

## 2023-10-25 DIAGNOSIS — R600 Localized edema: Secondary | ICD-10-CM | POA: Diagnosis not present

## 2023-10-25 DIAGNOSIS — E1159 Type 2 diabetes mellitus with other circulatory complications: Secondary | ICD-10-CM

## 2023-10-25 DIAGNOSIS — Z794 Long term (current) use of insulin: Secondary | ICD-10-CM | POA: Diagnosis not present

## 2023-10-25 DIAGNOSIS — I2510 Atherosclerotic heart disease of native coronary artery without angina pectoris: Secondary | ICD-10-CM | POA: Diagnosis not present

## 2023-10-25 DIAGNOSIS — E1169 Type 2 diabetes mellitus with other specified complication: Secondary | ICD-10-CM | POA: Diagnosis not present

## 2023-10-25 DIAGNOSIS — I255 Ischemic cardiomyopathy: Secondary | ICD-10-CM | POA: Diagnosis not present

## 2023-10-25 DIAGNOSIS — I129 Hypertensive chronic kidney disease with stage 1 through stage 4 chronic kidney disease, or unspecified chronic kidney disease: Secondary | ICD-10-CM | POA: Diagnosis not present

## 2023-10-25 DIAGNOSIS — N183 Chronic kidney disease, stage 3 unspecified: Secondary | ICD-10-CM

## 2023-10-25 MED ORDER — NITROGLYCERIN 0.4 MG SL SUBL: 0.4000 mg | SUBLINGUAL_TABLET | SUBLINGUAL | 11 refills | Status: AC | PRN

## 2023-10-25 MED ORDER — METOPROLOL SUCCINATE ER 25 MG PO TB24: 12.5000 mg | ORAL_TABLET | Freq: Every day | ORAL | 3 refills | Status: AC

## 2023-10-25 NOTE — Patient Instructions (Addendum)

## 2023-10-25 NOTE — Progress Notes (Signed)
 Order(s) created erroneously. Erroneous order ID: [id number]  Order moved by: STONEY MAGALENE FALCON  Order move date/time: 10/25/2023 4:34 PM  Source Patient: [name]  Source Contact: 10/25/2023  Destination Patient: [name]  Destination Contact: 08/11/2022

## 2023-11-09 DIAGNOSIS — E119 Type 2 diabetes mellitus without complications: Secondary | ICD-10-CM | POA: Diagnosis not present

## 2023-11-09 DIAGNOSIS — M5416 Radiculopathy, lumbar region: Secondary | ICD-10-CM | POA: Diagnosis not present

## 2023-11-15 ENCOUNTER — Other Ambulatory Visit: Payer: Self-pay | Admitting: Family Medicine

## 2023-11-15 ENCOUNTER — Other Ambulatory Visit: Payer: Self-pay | Admitting: Cardiovascular Disease

## 2023-11-15 DIAGNOSIS — E890 Postprocedural hypothyroidism: Secondary | ICD-10-CM

## 2023-11-17 NOTE — Telephone Encounter (Signed)
 Requested Prescriptions  Refused Prescriptions Disp Refills   levothyroxine  (SYNTHROID ) 125 MCG tablet [Pharmacy Med Name: ALVOGEN-LEVOTHYROXINE  TAB 125MCG] 60 tablet 5    Sig: TAKE 1 TABLET BY MOUTH DAILY  BEFORE BREAKFAST     Endocrinology:  Hypothyroid Agents Failed - 11/17/2023 10:35 AM      Failed - TSH in normal range and within 360 days    TSH  Date Value Ref Range Status  12/06/2022 4.89 (H) 0.40 - 4.50 mIU/L Final         Passed - Valid encounter within last 12 months    Recent Outpatient Visits           1 month ago Controlled type 2 diabetes mellitus with diabetic nephropathy, without long-term current use of insulin  [facility])   [place] [facility] [place], Marsa PARAS, DO   5 months ago Controlled type 2 diabetes mellitus with diabetic nephropathy, without long-term current use of insulin  [facility])   [place] [facility] [place], Marsa PARAS, [place]

## 2023-11-21 ENCOUNTER — Other Ambulatory Visit: Payer: Self-pay | Admitting: Family Medicine

## 2023-11-21 DIAGNOSIS — E890 Postprocedural hypothyroidism: Secondary | ICD-10-CM

## 2023-11-22 NOTE — Telephone Encounter (Signed)
 Requested by interface surescripts. Future visit 01/10/24.  Requested Prescriptions  Pending Prescriptions Disp Refills   levothyroxine  (SYNTHROID ) 125 MCG tablet [Pharmacy Med Name: ALVOGEN-LEVOTHYROXINE  TAB 125MCG] 90 tablet 0    Sig: TAKE 1 TABLET BY MOUTH DAILY  BEFORE BREAKFAST     Endocrinology:  Hypothyroid Agents Failed - 11/22/2023  4:15 PM      Failed - TSH in normal range and within 360 days    TSH  Date Value Ref Range Status  12/06/2022 4.89 (H) 0.40 - 4.50 mIU/L Final         Passed - Valid encounter within last 12 months    Recent Outpatient Visits           2 months ago Controlled type 2 diabetes mellitus with diabetic nephropathy, without long-term current use of insulin  [facility])   [place] [facility] [place], Marsa PARAS, DO   5 months ago Controlled type 2 diabetes mellitus with diabetic nephropathy, without long-term current use of insulin  [facility])   [place] [facility] [place], Marsa PARAS, [place]

## 2023-11-23 DIAGNOSIS — M5451 Vertebrogenic low back pain: Secondary | ICD-10-CM | POA: Diagnosis not present

## 2023-11-23 DIAGNOSIS — R262 Difficulty in walking, not elsewhere classified: Secondary | ICD-10-CM | POA: Diagnosis not present

## 2023-11-29 DIAGNOSIS — M5451 Vertebrogenic low back pain: Secondary | ICD-10-CM | POA: Diagnosis not present

## 2023-11-29 DIAGNOSIS — R262 Difficulty in walking, not elsewhere classified: Secondary | ICD-10-CM | POA: Diagnosis not present

## 2023-12-01 DIAGNOSIS — R262 Difficulty in walking, not elsewhere classified: Secondary | ICD-10-CM | POA: Diagnosis not present

## 2023-12-01 DIAGNOSIS — M5451 Vertebrogenic low back pain: Secondary | ICD-10-CM | POA: Diagnosis not present

## 2023-12-05 DIAGNOSIS — M5451 Vertebrogenic low back pain: Secondary | ICD-10-CM | POA: Diagnosis not present

## 2023-12-05 DIAGNOSIS — R262 Difficulty in walking, not elsewhere classified: Secondary | ICD-10-CM | POA: Diagnosis not present

## 2023-12-08 DIAGNOSIS — R262 Difficulty in walking, not elsewhere classified: Secondary | ICD-10-CM | POA: Diagnosis not present

## 2023-12-08 DIAGNOSIS — M5451 Vertebrogenic low back pain: Secondary | ICD-10-CM | POA: Diagnosis not present

## 2023-12-13 DIAGNOSIS — R262 Difficulty in walking, not elsewhere classified: Secondary | ICD-10-CM | POA: Diagnosis not present

## 2023-12-13 DIAGNOSIS — M5451 Vertebrogenic low back pain: Secondary | ICD-10-CM | POA: Diagnosis not present

## 2023-12-15 DIAGNOSIS — R262 Difficulty in walking, not elsewhere classified: Secondary | ICD-10-CM | POA: Diagnosis not present

## 2023-12-15 DIAGNOSIS — M5451 Vertebrogenic low back pain: Secondary | ICD-10-CM | POA: Diagnosis not present

## 2023-12-18 ENCOUNTER — Other Ambulatory Visit: Payer: Self-pay | Admitting: Family Medicine

## 2023-12-18 DIAGNOSIS — E1121 Type 2 diabetes mellitus with diabetic nephropathy: Secondary | ICD-10-CM

## 2023-12-18 DIAGNOSIS — I129 Hypertensive chronic kidney disease with stage 1 through stage 4 chronic kidney disease, or unspecified chronic kidney disease: Secondary | ICD-10-CM

## 2023-12-20 DIAGNOSIS — M5451 Vertebrogenic low back pain: Secondary | ICD-10-CM | POA: Diagnosis not present

## 2023-12-20 DIAGNOSIS — R262 Difficulty in walking, not elsewhere classified: Secondary | ICD-10-CM | POA: Diagnosis not present

## 2023-12-20 NOTE — Telephone Encounter (Signed)
 Requested medications are due for refill today.  yes  Requested medications are on the active medications list.  yes  Last refill. 07/08/2023  6 month supply  Future visit scheduled.   yes  Notes to clinic.  Labs are expired.    Requested Prescriptions  Pending Prescriptions Disp Refills   losartan  (COZAAR ) 100 MG tablet [Pharmacy Med Name: Losartan  Potassium 100 MG Oral Tablet] 100 tablet 2    Sig: TAKE 1 TABLET BY MOUTH DAILY     Cardiovascular:  Angiotensin Receptor Blockers Failed - 12/20/2023  2:26 PM      Failed - Cr in normal range and within 180 days    Creat  Date Value Ref Range Status  12/06/2022 1.01 (H) 0.60 - 1.00 mg/dL Final   Creatinine, Urine  Date Value Ref Range Status  06/08/2023 297  Final         Failed - K in normal range and within 180 days    Potassium  Date Value Ref Range Status  12/06/2022 4.7 3.5 - 5.3 mmol/L Final  02/06/2013 4.1 3.5 - 5.1 mmol/L Final         Passed - Patient is not pregnant      Passed - Last BP in normal range    BP Readings from Last 1 Encounters:  10/25/23 120/62         Passed - Valid encounter within last 6 months    Recent Outpatient Visits           3 months ago Controlled type 2 diabetes mellitus with diabetic nephropathy, without long-term current use of insulin  (HCC)   [facility] [place], Marsa PARAS, DO   6 months ago Controlled type 2 diabetes mellitus with diabetic nephropathy, without long-term current use of insulin  (HCC)   [place] [facility] [place], Marsa PARAS, DO               metFORMIN  (GLUCOPHAGE -XR) 750 MG 24 hr tablet [Pharmacy Med Name: metFORMIN  HCl ER 750 MG Oral Tablet Extended Release 24 Hour] 200 tablet 2    Sig: TAKE 2 TABLETS BY MOUTH DAILY  WITH BREAKFAST     Endocrinology:  Diabetes - Biguanides Failed - 12/20/2023  2:26 PM      Failed - Cr in normal range and within 360 days    Creat  Date Value Ref Range Status   12/06/2022 1.01 (H) 0.60 - 1.00 mg/dL Final   Creatinine, Urine  Date Value Ref Range Status  06/08/2023 297  Final         Failed - eGFR in normal range and within 360 days    GFR, Est African American  Date Value Ref Range Status  06/24/2020 67 > OR = 60 mL/min/1.71m2 Final   GFR, Est Non African American  Date Value Ref Range Status  06/24/2020 58 (L) > OR = 60 mL/min/1.33m2 Final   GFR, Estimated  Date Value Ref Range Status  03/08/2022 >60 >60 mL/min Final    Comment:    (NOTE) Calculated using the CKD-EPI Creatinine Equation (2021)    eGFR  Date Value Ref Range Status  12/06/2022 57 (L) > OR = 60 mL/min/1.85m2 Final         Failed - B12 Level in normal range and within 720 days    No results found for: VITAMINB12       Failed - CBC within normal limits and completed in the last 12 months  WBC  Date Value Ref Range Status  12/06/2022 5.2 3.8 - 10.8 Thousand/uL Final   RBC  Date Value Ref Range Status  12/06/2022 3.85 3.80 - 5.10 Million/uL Final   Hemoglobin  Date Value Ref Range Status  12/06/2022 11.5 (L) 11.7 - 15.5 g/dL Final   HGB  Date Value Ref Range Status  02/14/2013 11.6 (L) 12.0 - 16.0 g/dL Final   HCT  Date Value Ref Range Status  12/06/2022 37.4 35.0 - 45.0 % Final  02/06/2013 35.9 35.0 - 47.0 % Final   MCHC  Date Value Ref Range Status  12/06/2022 30.7 (L) 32.0 - 36.0 g/dL Final    Comment:    For adults, a slight decrease in the calculated MCHC value (in the range of 30 to 32 g/dL) is most likely not clinically significant; however, it should be interpreted with caution in correlation with other red cell parameters and the patient's clinical condition.    [facility]  Date Value Ref Range Status  12/06/2022 29.9 27.0 - 33.0 pg Final   MCV  Date Value Ref Range Status  12/06/2022 97.1 80.0 - 100.0 fL Final  02/06/2013 91 80 - 100 fL Final   No results found for: PLTCOUNTKUC, LABPLAT, POCPLA RDW  Date Value Ref Range  Status  12/06/2022 11.4 11.0 - 15.0 % Final  02/06/2013 12.7 11.5 - 14.5 % Final         Passed - HBA1C is between 0 and 7.9 and within 180 days    Hemoglobin A1C  Date Value Ref Range Status  09/20/2023 7.7 (A) 4.0 - 5.6 % Final  04/25/2022 7.9  Final   Hgb A1c MFr Bld  Date Value Ref Range Status  12/06/2022 9.2 (H) <5.7 % of total Hgb Final    Comment:    For someone without known diabetes, a hemoglobin A1c value of 6.5% or greater indicates that they may have  diabetes and this should be confirmed with a follow-up  test. . For someone with known diabetes, a value <7% indicates  that their diabetes is well controlled and a value  greater than or equal to 7% indicates suboptimal  control. A1c targets should be individualized based on  duration of diabetes, age, comorbid conditions, and  other considerations. . Currently, no consensus exists regarding use of hemoglobin A1c for diagnosis of diabetes for children. [name] - Valid encounter within last 6 months    Recent Outpatient Visits           3 months ago Controlled type 2 diabetes mellitus with diabetic nephropathy, without long-term current use of insulin  [facility])   [place] [facility] [place], Marsa PARAS, DO   6 months ago Controlled type 2 diabetes mellitus with diabetic nephropathy, without long-term current use of insulin  [facility])    [facility] [place], Marsa PARAS, DO               sg

## 2023-12-22 ENCOUNTER — Other Ambulatory Visit

## 2023-12-22 DIAGNOSIS — E1121 Type 2 diabetes mellitus with diabetic nephropathy: Secondary | ICD-10-CM

## 2023-12-22 DIAGNOSIS — M5451 Vertebrogenic low back pain: Secondary | ICD-10-CM | POA: Diagnosis not present

## 2023-12-22 DIAGNOSIS — Z0000 Encounter for general adult medical examination without abnormal findings: Secondary | ICD-10-CM

## 2023-12-22 DIAGNOSIS — R262 Difficulty in walking, not elsewhere classified: Secondary | ICD-10-CM | POA: Diagnosis not present

## 2023-12-22 DIAGNOSIS — E1169 Type 2 diabetes mellitus with other specified complication: Secondary | ICD-10-CM

## 2023-12-22 DIAGNOSIS — N183 Chronic kidney disease, stage 3 unspecified: Secondary | ICD-10-CM

## 2023-12-22 DIAGNOSIS — E890 Postprocedural hypothyroidism: Secondary | ICD-10-CM

## 2023-12-23 ENCOUNTER — Other Ambulatory Visit

## 2023-12-23 ENCOUNTER — Ambulatory Visit (INDEPENDENT_AMBULATORY_CARE_PROVIDER_SITE_OTHER)

## 2023-12-23 DIAGNOSIS — E1169 Type 2 diabetes mellitus with other specified complication: Secondary | ICD-10-CM | POA: Diagnosis not present

## 2023-12-23 DIAGNOSIS — Z23 Encounter for immunization: Secondary | ICD-10-CM | POA: Diagnosis not present

## 2023-12-23 DIAGNOSIS — E785 Hyperlipidemia, unspecified: Secondary | ICD-10-CM | POA: Diagnosis not present

## 2023-12-23 DIAGNOSIS — E1121 Type 2 diabetes mellitus with diabetic nephropathy: Secondary | ICD-10-CM | POA: Diagnosis not present

## 2023-12-23 DIAGNOSIS — I129 Hypertensive chronic kidney disease with stage 1 through stage 4 chronic kidney disease, or unspecified chronic kidney disease: Secondary | ICD-10-CM | POA: Diagnosis not present

## 2023-12-23 DIAGNOSIS — E890 Postprocedural hypothyroidism: Secondary | ICD-10-CM | POA: Diagnosis not present

## 2023-12-24 LAB — CBC WITH DIFFERENTIAL/PLATELET
Absolute Lymphocytes: 1399 {cells}/uL (ref 850–3900)
Absolute Monocytes: 586 {cells}/uL (ref 200–950)
Basophils Absolute: 19 {cells}/uL (ref 0–200)
Basophils Relative: 0.3 %
Eosinophils Absolute: 69 {cells}/uL (ref 15–500)
Eosinophils Relative: 1.1 %
HCT: 35.1 % (ref 35.0–45.0)
Hemoglobin: 11.5 g/dL — ABNORMAL LOW (ref 11.7–15.5)
MCH: 31.1 pg (ref 27.0–33.0)
MCHC: 32.8 g/dL (ref 32.0–36.0)
MCV: 94.9 fL (ref 80.0–100.0)
MPV: 9.6 fL (ref 7.5–12.5)
Monocytes Relative: 9.3 %
Neutro Abs: 4227 {cells}/uL (ref 1500–7800)
Neutrophils Relative %: 67.1 %
Platelets: 265 Thousand/uL (ref 140–400)
RBC: 3.7 Million/uL — ABNORMAL LOW (ref 3.80–5.10)
RDW: 11.8 % (ref 11.0–15.0)
Total Lymphocyte: 22.2 %
WBC: 6.3 Thousand/uL (ref 3.8–10.8)

## 2023-12-24 LAB — COMPREHENSIVE METABOLIC PANEL WITH GFR
AG Ratio: 1.4 (calc) (ref 1.0–2.5)
ALT: 14 U/L (ref 6–29)
AST: 17 U/L (ref 10–35)
Albumin: 3.5 g/dL — ABNORMAL LOW (ref 3.6–5.1)
Alkaline phosphatase (APISO): 65 U/L (ref 37–153)
BUN: 13 mg/dL (ref 7–25)
CO2: 26 mmol/L (ref 20–32)
Calcium: 8.7 mg/dL (ref 8.6–10.4)
Chloride: 102 mmol/L (ref 98–110)
Creat: 0.92 mg/dL (ref 0.60–1.00)
Globulin: 2.5 g/dL (ref 1.9–3.7)
Glucose, Bld: 160 mg/dL — ABNORMAL HIGH (ref 65–99)
Potassium: 4.3 mmol/L (ref 3.5–5.3)
Sodium: 137 mmol/L (ref 135–146)
Total Bilirubin: 0.5 mg/dL (ref 0.2–1.2)
Total Protein: 6 g/dL — ABNORMAL LOW (ref 6.1–8.1)
eGFR: 64 mL/min/1.73m2 (ref >=60)

## 2023-12-24 LAB — MICROALBUMIN / CREATININE URINE RATIO
Creatinine, Urine: 58 mg/dL (ref 20–275)
Microalb Creat Ratio: 26 mg/g{creat} (ref <30)
Microalb, Ur: 1.5 mg/dL

## 2023-12-24 LAB — LIPID PANEL
Cholesterol: 85 mg/dL (ref <200)
HDL: 44 mg/dL — ABNORMAL LOW (ref >=50)
LDL Cholesterol (Calc): 20 mg/dL
Non-HDL Cholesterol (Calc): 41 mg/dL (ref <130)
Total CHOL/HDL Ratio: 1.9 (calc) (ref <5.0)
Triglycerides: 124 mg/dL (ref <150)

## 2023-12-24 LAB — HEMOGLOBIN A1C
Hgb A1c MFr Bld: 9.8 % — ABNORMAL HIGH (ref <5.7)
Mean Plasma Glucose: 235 mg/dL
eAG (mmol/L): 13 mmol/L

## 2023-12-24 LAB — TSH: TSH: 6.96 m[IU]/L — ABNORMAL HIGH (ref 0.40–4.50)

## 2023-12-24 LAB — T4, FREE: Free T4: 1.3 ng/dL (ref 0.8–1.8)

## 2023-12-27 ENCOUNTER — Ambulatory Visit: Payer: Self-pay | Admitting: Family Medicine

## 2023-12-27 DIAGNOSIS — R262 Difficulty in walking, not elsewhere classified: Secondary | ICD-10-CM | POA: Diagnosis not present

## 2023-12-27 DIAGNOSIS — M5451 Vertebrogenic low back pain: Secondary | ICD-10-CM | POA: Diagnosis not present

## 2023-12-29 DIAGNOSIS — R262 Difficulty in walking, not elsewhere classified: Secondary | ICD-10-CM | POA: Diagnosis not present

## 2023-12-29 DIAGNOSIS — M5451 Vertebrogenic low back pain: Secondary | ICD-10-CM | POA: Diagnosis not present

## 2024-01-10 ENCOUNTER — Ambulatory Visit: Admitting: Family Medicine

## 2024-01-10 ENCOUNTER — Encounter: Payer: Self-pay | Admitting: Family Medicine

## 2024-01-10 VITALS — BP 120/68 | HR 57 | Ht 66.0 in | Wt 217.5 lb

## 2024-01-10 DIAGNOSIS — M150 Primary generalized (osteo)arthritis: Secondary | ICD-10-CM

## 2024-01-10 DIAGNOSIS — Z23 Encounter for immunization: Secondary | ICD-10-CM | POA: Diagnosis not present

## 2024-01-10 DIAGNOSIS — N182 Chronic kidney disease, stage 2 (mild): Secondary | ICD-10-CM

## 2024-01-10 DIAGNOSIS — G8929 Other chronic pain: Secondary | ICD-10-CM

## 2024-01-10 DIAGNOSIS — E1121 Type 2 diabetes mellitus with diabetic nephropathy: Secondary | ICD-10-CM | POA: Diagnosis not present

## 2024-01-10 DIAGNOSIS — E785 Hyperlipidemia, unspecified: Secondary | ICD-10-CM

## 2024-01-10 DIAGNOSIS — M25512 Pain in left shoulder: Secondary | ICD-10-CM

## 2024-01-10 DIAGNOSIS — Z0000 Encounter for general adult medical examination without abnormal findings: Secondary | ICD-10-CM | POA: Diagnosis not present

## 2024-01-10 DIAGNOSIS — I129 Hypertensive chronic kidney disease with stage 1 through stage 4 chronic kidney disease, or unspecified chronic kidney disease: Secondary | ICD-10-CM | POA: Diagnosis not present

## 2024-01-10 DIAGNOSIS — G4733 Obstructive sleep apnea (adult) (pediatric): Secondary | ICD-10-CM

## 2024-01-10 DIAGNOSIS — Z7985 Long-term (current) use of injectable non-insulin antidiabetic drugs: Secondary | ICD-10-CM

## 2024-01-10 DIAGNOSIS — E890 Postprocedural hypothyroidism: Secondary | ICD-10-CM

## 2024-01-10 DIAGNOSIS — M25511 Pain in right shoulder: Secondary | ICD-10-CM

## 2024-01-10 DIAGNOSIS — E1169 Type 2 diabetes mellitus with other specified complication: Secondary | ICD-10-CM | POA: Diagnosis not present

## 2024-01-10 MED ORDER — TRAMADOL HCL 50 MG PO TABS
50.0000 mg | ORAL_TABLET | Freq: Four times a day (QID) | ORAL | 2 refills | Status: AC | PRN
Start: 2024-01-10 — End: ?

## 2024-01-10 MED ORDER — LEVOTHYROXINE SODIUM 125 MCG PO TABS: 125.0000 ug | ORAL_TABLET | Freq: Every day | ORAL | 3 refills | Status: AC

## 2024-01-10 NOTE — Progress Notes (Signed)
 Subjective:    Patient ID: [name], female    DOB: [birth year], 78 y.o.   MRN: [id number]  [name] is a 78 y.o. female presenting on 01/10/2024 for Annual Exam   HPI  Discussed the use of AI scribe software for clinical note transcription with the patient, who gave verbal consent to proceed.  History of Present Illness   [name] is a 78 year old female with diabetes and hypothyroidism who presents for an annual physical exam and medication management.  Anemia - History of mild anemia, improved since hospitalization two years ago for severe illness. - Current hemoglobin is 11.5.       Type 2 Diabetes - Hemoglobin A1c increased to 9.8 from a previous low of 7.0 while on Mounjaro . - Discontinued Mounjaro  three months ago. Due to severe GI side effects, also failed Ozempic  in past - Current medications: Farxiga (dapagliflozin) 10 mg daily, Metformin  750 mg = 1500mg  XR - History of cholecystectomy; patient questions possible contribution to gastrointestinal symptoms   Obstructive sleep apnea - Uses CPAP machine for management of obstructive sleep apnea On CPAP, improved    Hypothyroidism Controlled Lab showed slightly elevated TSH but normal T4 Currently on Levothyroxine  125 mcg daily She is doing well overall on this dose.   Elevated PTH Managed by Nephrology   HYPERTENSION CKD III  Reports BP controlled Stable Creatinine 0.92, eGFR 64 Current Meds - Metoprolol  12.5mg  XL daily (half of 25), Losartan  100mg  daily   Reports good compliance, took meds today. Tolerating well, w/o complaints. Denies CP, dyspnea, HA, edema, dizziness / lightheadedness     Osteoarthritis / Chronic Pain Musculoskeletal pain - Takes tramadol  infrequently, about one to two pills per week. - Experiencing shoulder pain attributed to overuse while compensating for previous hip pain. - Attending physical therapy for hip and seeking to continue therapy for shoulders. -  Shoulder pain disrupts sleep, leading to sleeping in a reclined chair at times.  The patient occasionally uses tramadol  for joint pain, particularly before participating in activities   PT successful for lower extremities, now has shoulder pain asking for PT order    Health Maintenance: Prevnar-20 today  Past Surgical History:  Procedure Laterality Date   CATARACT EXTRACTION W/PHACO Left 08/30/2016   Procedure: CATARACT EXTRACTION PHACO AND INTRAOCULAR LENS PLACEMENT (IOC) Left daibetic;  Surgeon: Mittie Gaskin, MD;  Location: [facility];  Service: Ophthalmology;  Laterality: Left;  Diabetic - insulin  and oral meds sleep apnea   CATARACT EXTRACTION W/PHACO Right 10/07/2016   Procedure: CATARACT EXTRACTION PHACO AND INTRAOCULAR LENS PLACEMENT (IOC);  Surgeon: Mittie Gaskin, MD;  Location: [facility];  Service: Ophthalmology;  Laterality: Right;  Lot# [id number] H US : 00:43.3 AP%: 12.2 CDE: 5.28   CORONARY ANGIOPLASTY WITH STENT PLACEMENT Left 08/14/2013   Procedure: CORONARY ANGIOPLASTY WITH STENT PLACEMENT (3.5 x 23 mm Xience Alpine DES to LAD); Location: [facility], [place], [place]; Surgeon: Dale Pour, MD   GANGLION CYST EXCISION Right 1980   IR EXCHANGE BILIARY DRAIN  02/23/2022   IR PERC CHOLECYSTOSTOMY  01/28/2022   IR PERC CHOLECYSTOSTOMY  01/31/2022   LAPAROSCOPIC HYSTERECTOMY     THYROIDECTOMY  1995   total   TOTAL KNEE ARTHROPLASTY Right 12/18/2020   Procedure: RIGHT TOTAL KNEE ARTHROPLASTY;  Surgeon: Marchia Drivers, MD;  Location: [facility];  Service: Orthopedics;  Laterality: Right;   TOTAL KNEE ARTHROPLASTY Left 12/10/2021   Procedure: TOTAL KNEE ARTHROPLASTY;  Surgeon: Marchia Drivers, MD;  Location: [facility];  Service: Orthopedics;  Laterality: Left;        01/10/2024    1:36 PM 09/20/2023    1:23 PM 09/20/2023    1:22 PM  Depression screen PHQ 2/9  Decreased Interest 0 0 0  Down, Depressed, Hopeless 0 0 0  PHQ - 2 Score 0 0 0   Altered sleeping 1 0   Tired, decreased energy 0 1   Change in appetite 0 1   Feeling bad or failure about yourself  0 0   Trouble concentrating 1 0   Moving slowly or fidgety/restless 0 0   Suicidal thoughts 0 0   PHQ-9 Score 2 2    Difficult doing work/chores Somewhat difficult       Data saved with a previous flowsheet row definition       01/10/2024    1:37 PM 09/20/2023    1:23 PM 06/13/2023    1:20 PM 12/13/2022    1:20 PM  GAD 7 : Generalized Anxiety Score  Nervous, Anxious, on Edge 0 0 0 0  Control/stop worrying 0 0 0 0  Worry too much - different things 0 0 0 0  Trouble relaxing 0 0 0 0  Restless 0 0 0 0  Easily annoyed or irritable 0 0 0 0  Afraid - awful might happen 0 0 0 0  Total GAD 7 Score 0 0 0 0     Past Medical History:  Diagnosis Date   Coronary artery disease    a. 07/2013 STEMI/PCI ([place]): LM nl, LAD 100p (thrombectomy & 3.5x23 Alpine DES), LCX nl, RCA nl, EF 45%.   Depression    Diastolic dysfunction    a.) TTE 11/26/2020 - LVEF 50-55%; G2DD.   Endometrial cancer (HCC)    Gravida 3 para 3    Hyperlipidemia    Hypertension    Hypertensive heart disease    Hypothyroid    Ischemic cardiomyopathy    a.) 07/2013 EF 45% @ time of STEMI; b.) 11/2013 Echo: EF 55-60%, mildly dil LA, nl RV fxn. c.) TTE 07/29/2015 - LVEF 55-60%, mild LA and RV dilation. d.) TTE 11/26/2020 - LVEF 50-55%; G2DD.   Menopause    Obstructive sleep apnea on CPAP    Osteoarthritis    PONV (postoperative nausea and vomiting)    ST elevation myocardial infarction (STEMI) of anterolateral wall (HCC) 08/14/2013   a.) LVEF reduced at 45%; Tx'd with trombectomy and PCI to 100% LAD; 3.5 x 23 mm Xience Alpine DES placed.   T2DM (type 2 diabetes mellitus) (HCC)    Thyroid  cancer (HCC)    a.) s/p total thyroidectomy. b.) on daily levothyroxine .   Past Surgical History:  Procedure Laterality Date   CATARACT EXTRACTION W/PHACO Left 08/30/2016   Procedure: CATARACT EXTRACTION PHACO AND  INTRAOCULAR LENS PLACEMENT (IOC) Left daibetic;  Surgeon: Mittie Gaskin, MD;  Location: [facility];  Service: Ophthalmology;  Laterality: Left;  Diabetic - insulin  and oral meds sleep apnea   CATARACT EXTRACTION W/PHACO Right 10/07/2016   Procedure: CATARACT EXTRACTION PHACO AND INTRAOCULAR LENS PLACEMENT (IOC);  Surgeon: Mittie Gaskin, MD;  Location: [facility];  Service: Ophthalmology;  Laterality: Right;  Lot# [id number] H US : 00:43.3 AP%: 12.2 CDE: 5.28   CORONARY ANGIOPLASTY WITH STENT PLACEMENT Left 08/14/2013   Procedure: CORONARY ANGIOPLASTY WITH STENT PLACEMENT (3.5 x 23 mm Xience Alpine DES to LAD); Location: [facility], [place], [place]; Surgeon: Dale Pour, MD   GANGLION CYST EXCISION Right 1980   IR EXCHANGE BILIARY DRAIN  02/23/2022   IR PERC CHOLECYSTOSTOMY  01/28/2022   IR PERC CHOLECYSTOSTOMY  01/31/2022   LAPAROSCOPIC HYSTERECTOMY     THYROIDECTOMY  1995   total   TOTAL KNEE ARTHROPLASTY Right 12/18/2020   Procedure: RIGHT TOTAL KNEE ARTHROPLASTY;  Surgeon: Marchia Drivers, MD;  Location: [facility];  Service: Orthopedics;  Laterality: Right;   TOTAL KNEE ARTHROPLASTY Left 12/10/2021   Procedure: TOTAL KNEE ARTHROPLASTY;  Surgeon: Marchia Drivers, MD;  Location: [facility];  Service: Orthopedics;  Laterality: Left;   Social History   Socioeconomic History   Marital status: Widowed    Spouse name: Not on file   Number of children: 3   Years of education: Not on file   Highest education level: Bachelor's degree (e.g., BA, AB, BS)  Occupational History   Occupation: retired  Tobacco Use   Smoking status: Never   Smokeless tobacco: Never  Vaping Use   Vaping status: Never Used  Substance and Sexual Activity   Alcohol use: No   Drug use: No   Sexual activity: Not on file  Other Topics Concern   Not on file  Social History Narrative   Not on file   Social Drivers of Health   Financial Resource Strain: Low Risk  (07/08/2023)   Overall  Financial Resource Strain (CARDIA)    Difficulty of Paying Living Expenses: Not hard at all  Food Insecurity: No Food Insecurity (07/08/2023)   Hunger Vital Sign    Worried About Running Out of Food in the Last Year: Never true    Ran Out of Food in the Last Year: Never true  Transportation Needs: No Transportation Needs (07/08/2023)   PRAPARE - Administrator, Civil Service (Medical): No    Lack of Transportation (Non-Medical): No  Physical Activity: Insufficiently Active (07/08/2023)   Exercise Vital Sign    Days of Exercise per Week: 2 days    Minutes of Exercise per Session: 20 min  Stress: No Stress Concern Present (07/08/2023)   [employer] of Occupational Health - Occupational Stress Questionnaire    Feeling of Stress : Only a little  Social Connections: Moderately Isolated (07/08/2023)   Social Connection and Isolation Panel    Frequency of Communication with Friends and Family: More than three times a week    Frequency of Social Gatherings with Friends and Family: More than three times a week    Attends Religious Services: Never    Database Administrator or Organizations: Yes    Attends Engineer, Structural: More than 4 times per year    Marital Status: Widowed  Intimate Partner Violence: Not At Risk (07/08/2023)   Humiliation, Afraid, Rape, and Kick questionnaire    Fear of Current or Ex-Partner: No    Emotionally Abused: No    Physically Abused: No    Sexually Abused: No   Family History  Problem Relation Age of Onset   Heart attack Father    Heart failure Father    Hypertension Father    Hyperlipidemia Father    Heart disease Father 48       CABG    Pulmonary fibrosis Son    Breast cancer Neg Hx    Current Outpatient Medications on File Prior to Visit  Medication Sig   acetaminophen  (TYLENOL ) 650 MG CR tablet Take 1,950 mg by mouth daily.   aspirin  EC 81 MG tablet Take 81 mg by mouth daily. Swallow whole.   dapagliflozin propanediol (FARXIGA)  10 MG TABS tablet  Take 10 mg by mouth daily.   ezetimibe  (ZETIA ) 10 MG tablet TAKE 1 TABLET BY MOUTH DAILY   fluticasone  (FLONASE ) 50 MCG/ACT nasal spray Place 2 sprays into both nostrils daily.   furosemide  (LASIX ) 20 MG tablet TAKE 1 TABLET BY MOUTH DAILY AS  NEEDED FOR EDEMA OR FLUID   Lancets (ONETOUCH ULTRASOFT) lancets Use to check blood sugar daily   losartan  (COZAAR ) 100 MG tablet TAKE 1 TABLET BY MOUTH DAILY   metFORMIN  (GLUCOPHAGE -XR) 750 MG 24 hr tablet TAKE 2 TABLETS BY MOUTH DAILY  WITH BREAKFAST   metoprolol  succinate (TOPROL -XL) 25 MG 24 hr tablet Take 0.5 tablets (12.5 mg total) by mouth at bedtime.   nitroGLYCERIN  (NITROSTAT ) 0.4 MG SL tablet Place 1 tablet (0.4 mg total) under the tongue every 5 (five) minutes as needed for chest pain.   ONETOUCH ULTRA test strip Use to check blood sugar daily   rosuvastatin  (CRESTOR ) 20 MG tablet TAKE 1 TABLET BY MOUTH DAILY   No current facility-administered medications on file prior to visit.    Review of Systems  Constitutional:  Negative for activity change, appetite change, chills, diaphoresis, fatigue and fever.  HENT:  Negative for congestion and hearing loss.   Eyes:  Negative for visual disturbance.  Respiratory:  Negative for cough, chest tightness, shortness of breath and wheezing.   Cardiovascular:  Negative for chest pain, palpitations and leg swelling.  Gastrointestinal:  Negative for abdominal pain, constipation, diarrhea, nausea and vomiting.  Genitourinary:  Negative for dysuria, frequency and hematuria.  Musculoskeletal:  Negative for arthralgias and neck pain.  Skin:  Negative for rash.  Neurological:  Negative for dizziness, weakness, light-headedness, numbness and headaches.  Hematological:  Negative for adenopathy.  Psychiatric/Behavioral:  Negative for behavioral problems, dysphoric mood and sleep disturbance.    Per HPI unless specifically indicated above     Objective:    BP 120/68 (BP Location: Right Arm,  Patient Position: Sitting, Cuff Size: Normal)   Pulse (!) 57   Ht 5' 6 (1.676 m)   Wt 217 lb 8 oz (98.7 kg)   SpO2 97%   BMI 35.11 kg/m   Wt Readings from Last 3 Encounters:  01/10/24 217 lb 8 oz (98.7 kg)  10/25/23 224 lb 4 oz (101.7 kg)  09/20/23 229 lb 4 oz (104 kg)    Physical Exam Vitals and nursing note reviewed.  Constitutional:      General: She is not in acute distress.    Appearance: She is well-developed. She is obese. She is not diaphoretic.     Comments: Well-appearing, comfortable, cooperative  HENT:     Head: Normocephalic and atraumatic.  Eyes:     General:        Right eye: No discharge.        Left eye: No discharge.     Conjunctiva/sclera: Conjunctivae normal.     Pupils: Pupils are equal, round, and reactive to light.  Neck:     Thyroid : No thyromegaly.     Vascular: No carotid bruit.  Cardiovascular:     Rate and Rhythm: Normal rate and regular rhythm.     Pulses: Normal pulses.     Heart sounds: Normal heart sounds. No murmur heard. Pulmonary:     Effort: Pulmonary effort is normal. No respiratory distress.     Breath sounds: Normal breath sounds. No wheezing or rales.  Abdominal:     General: Bowel sounds are normal. There is no distension.     Palpations: Abdomen  is soft. There is no mass.     Tenderness: There is no abdominal tenderness.  Musculoskeletal:        General: No tenderness. Normal range of motion.     Cervical back: Normal range of motion and neck supple.     Right lower leg: No edema.     Left lower leg: No edema.     Comments: Upper / Lower Extremities: - Normal muscle tone, strength bilateral upper extremities 5/5, lower extremities 5/5  Lymphadenopathy:     Cervical: No cervical adenopathy.  Skin:    General: Skin is warm and dry.     Findings: No erythema or rash.  Neurological:     Mental Status: She is alert and oriented to person, place, and time.     Comments: Distal sensation intact to light touch all extremities   Psychiatric:        Mood and Affect: Mood normal.        Behavior: Behavior normal.        Thought Content: Thought content normal.     Comments: Well groomed, good eye contact, normal speech and thoughts     Results for orders placed or performed in visit on 12/22/23  Comprehensive metabolic panel with GFR   Collection Time: 12/23/23  8:53 AM  Result Value Ref Range   Glucose, Bld 160 (H) 65 - 99 mg/dL   BUN 13 7 - 25 mg/dL   Creat 9.07 9.39 - 8.99 mg/dL   eGFR 64 > OR = 60 fO/fpw/8.26f7   BUN/Creatinine Ratio SEE NOTE: 6 - 22 (calc)   Sodium 137 135 - 146 mmol/L   Potassium 4.3 3.5 - 5.3 mmol/L   Chloride 102 98 - 110 mmol/L   CO2 26 20 - 32 mmol/L   Calcium  8.7 8.6 - 10.4 mg/dL   Total Protein 6.0 (L) 6.1 - 8.1 g/dL   Albumin  3.5 (L) 3.6 - 5.1 g/dL   Globulin 2.5 1.9 - 3.7 g/dL (calc)   AG Ratio 1.4 1.0 - 2.5 (calc)   Total Bilirubin 0.5 0.2 - 1.2 mg/dL   Alkaline phosphatase (APISO) 65 37 - 153 U/L   AST 17 10 - 35 U/L   ALT 14 6 - 29 U/L  TSH   Collection Time: 12/23/23  8:53 AM  Result Value Ref Range   TSH 6.96 (H) 0.40 - 4.50 mIU/L  T4, free   Collection Time: 12/23/23  8:53 AM  Result Value Ref Range   Free T4 1.3 0.8 - 1.8 ng/dL  Microalbumin / creatinine urine ratio   Collection Time: 12/23/23  8:53 AM  Result Value Ref Range   Creatinine, Urine 58 20 - 275 mg/dL   Microalb, Ur 1.5 mg/dL   Microalb Creat Ratio 26 <30 mg/g creat  CBC with Differential/Platelet   Collection Time: 12/23/23  8:53 AM  Result Value Ref Range   WBC 6.3 3.8 - 10.8 Thousand/uL   RBC 3.70 (L) 3.80 - 5.10 Million/uL   Hemoglobin 11.5 (L) 11.7 - 15.5 g/dL   HCT 64.8 64.9 - 54.9 %   MCV 94.9 80.0 - 100.0 fL   MCH 31.1 27.0 - 33.0 pg   MCHC 32.8 32.0 - 36.0 g/dL   RDW 88.1 88.9 - 84.9 %   Platelets 265 140 - 400 Thousand/uL   MPV 9.6 7.5 - 12.5 fL   Neutro Abs 4,227 1,500 - 7,800 cells/uL   Absolute Lymphocytes 1,399 850 - 3,900 cells/uL   Absolute Monocytes 586  200 - 950  cells/uL   Eosinophils Absolute 69 15 - 500 cells/uL   Basophils Absolute 19 0 - 200 cells/uL   Neutrophils Relative % 67.1 %   Total Lymphocyte 22.2 %   Monocytes Relative 9.3 %   Eosinophils Relative 1.1 %   Basophils Relative 0.3 %  Hemoglobin A1c   Collection Time: 12/23/23  8:53 AM  Result Value Ref Range   Hgb A1c MFr Bld 9.8 (H) <5.7 %   Mean Plasma Glucose 235 mg/dL   eAG (mmol/L) 86.9 mmol/L  Lipid panel   Collection Time: 12/23/23  8:53 AM  Result Value Ref Range   Cholesterol 85 <200 mg/dL   HDL 44 (L) > OR = 50 mg/dL   Triglycerides 875 <849 mg/dL   LDL Cholesterol (Calc) 20 mg/dL (calc)   Total CHOL/HDL Ratio 1.9 <5.0 (calc)   Non-HDL Cholesterol (Calc) 41 <869 mg/dL (calc)      Assessment & Plan:   Problem List Items Addressed This Visit     Benign hypertension with CKD (chronic kidney disease) stage III (HCC)   Hyperlipidemia associated with type 2 diabetes mellitus (HCC)   Hypothyroidism, postop   Relevant Medications   levothyroxine  (SYNTHROID ) 125 MCG tablet   Morbid obesity (HCC)   Obstructive sleep apnea on CPAP   Other Visit Diagnoses       Annual physical exam    -  Primary     Primary osteoarthritis involving multiple joints       Relevant Medications   traMADol  (ULTRAM ) 50 MG tablet     Controlled type 2 diabetes mellitus with diabetic nephropathy, without long-term current use of insulin  (HCC)         Long-term current use of injectable noninsulin antidiabetic medication         Need for Streptococcus pneumoniae vaccination       Relevant Orders   Pneumococcal conjugate vaccine 20-valent (Completed)     Chronic pain of both shoulders       Relevant Medications   traMADol  (ULTRAM ) 50 MG tablet   Other Relevant Orders   Ambulatory referral to Physical Therapy        Updated Health Maintenance information Reviewed recent lab results with patient Encouraged improvement to lifestyle with diet and exercise Goal of weight loss    Adult  Wellness Visit Annual wellness visit conducted with review of lab results and current medications. - Administered pneumonia vaccine. - Continue current medications as prescribed.  Type 2 diabetes mellitus, poorly controlled A1c increased to 9.8 from previous 7.0. Current regimen includes metformin  and Farxiga, but glycemic control remains suboptimal.  Failed Ozempic , Mounjaro  due to GI side effects Discussed potential use of Rybelsus  as a new treatment option. Considered Januvia  as an alternative if Rybelsus  is not tolerated. Discussed potential side effects and dosing requirements of Rybelsus . Past use on Januvia  successful years ago but high cost.  - Attempted to give sample of Rybelsus  for 30-day trial. But we are out, will call drug rep to get new sample. - Instructed to take Rybelsus  on an empty stomach, 30 minutes before other medications or food. - If Rybelsus  is not tolerated, will consider starting Januvia .  Hopefully we can get sample and trial Rybelsus , or we can just order if prefer - Scheduled follow-up in 4 months to reassess glycemic control.  Chronic kidney disease, stage 2 Kidney function has improved with creatinine at 0.92 and GFR at 64. - Continue current management and monitoring of kidney function.  Postprocedural hypothyroidism TSH levels fluctuate, but T4 levels are stable and within normal range. Current levothyroxine  dose is effective. - Continue current dose of levothyroxine . 125mcg daily - Refilled levothyroxine  prescription.  Hypertension Blood pressure is well-controlled at 120/68 mmHg. - Continue current antihypertensive regimen.  Obstructive sleep apnea Continues to use CPAP machine with good adherence. - Continue CPAP therapy.  Primary generalized osteoarthritis  Bilateral shoulder pain Likely due to overuse and compensatory mechanisms for hip pain. Physical therapy has been beneficial for hip pain and is now addressing shoulder pain. - Provided  referral for physical therapy for bilateral shoulder pain. - Continue [facility] in [place]. Referral sent.  Mild anemia Iron levels are stable with hemoglobin at 11.5. - Continue monitoring hemoglobin and iron levels.  General Health Maintenance Reviewed immunization status and updated pneumonia vaccine. Discussed importance of nutrition and maintaining a balanced diet. - Administered pneumonia vaccine. - Encouraged balanced diet and adequate nutrition.        Orders Placed This Encounter  Procedures   Pneumococcal conjugate vaccine 20-valent   Ambulatory referral to Physical Therapy    Referral Priority:   Routine    Referral Type:   Physical Medicine    Referral Reason:   Specialty Services Required    Requested Specialty:   Physical Therapy    Number of Visits Requested:   1    Meds ordered this encounter  Medications   traMADol  (ULTRAM ) 50 MG tablet    Sig: Take 1 tablet (50 mg total) by mouth every 6 (six) hours as needed.    Dispense:  30 tablet    Refill:  2   levothyroxine  (SYNTHROID ) 125 MCG tablet    Sig: Take 1 tablet (125 mcg total) by mouth daily before breakfast.    Dispense:  90 tablet    Refill:  3    Requesting 1 year supply     Follow up plan: Return in about 4 months (around 05/09/2024) for 4 month PreDM A1c.  Marsa Officer, DO [facility] 01/10/2024, 1:52 PM

## 2024-01-10 NOTE — Patient Instructions (Addendum)
 Thank you for coming to the office today.  Recent Labs    06/13/23 1327 09/20/23 1326 12/23/23 0853  HGBA1C 10.9* 7.7* 9.8*    We will try to request a Rybelsus  30 day sample, and let you know  Taken 30 min before any other med supplement or food  Then can take Thyroid  med then other meds.  If you do well on this one, we can raise the dose to 7mg  if you do well, and we can get it approved.  Or if you would like to try the Januvia  again we can explore this,   Let me know  If 1-2 weeks goes by and you don't hear back, then you can message and or call us  to find out.  Referral to [facility] [place] for bilateral shoulder symptoms.  Please schedule a Follow-up Appointment to: Return in about 4 months (around 05/09/2024) for 4 month PreDM A1c.  If you have any other questions or concerns, please feel free to call the office or send a message through MyChart. You may also schedule an earlier appointment if necessary.  Additionally, you may be receiving a survey about your experience at our office within a few days to 1 week by e-mail or mail. We value your feedback.  Marsa Officer, DO [facility], [place]

## 2024-01-30 ENCOUNTER — Encounter: Payer: Self-pay | Admitting: Family Medicine

## 2024-02-20 ENCOUNTER — Telehealth: Admitting: Physician Assistant

## 2024-02-20 DIAGNOSIS — J019 Acute sinusitis, unspecified: Secondary | ICD-10-CM | POA: Diagnosis not present

## 2024-02-20 DIAGNOSIS — B9689 Other specified bacterial agents as the cause of diseases classified elsewhere: Secondary | ICD-10-CM | POA: Diagnosis not present

## 2024-02-20 MED ORDER — DOXYCYCLINE HYCLATE 100 MG PO TABS: 100.0000 mg | ORAL_TABLET | Freq: Two times a day (BID) | ORAL | 0 refills | Status: AC

## 2024-02-20 NOTE — Progress Notes (Signed)
 E-Visit for Sinus Problems  We are sorry that you are not feeling well.  Here is how we plan to help!  Based on what you have shared with me it looks like you have sinusitis.  Sinusitis is inflammation and infection in the sinus cavities of the head.  Based on your presentation I believe you most likely have Acute Bacterial Sinusitis.  This is an infection caused by bacteria and is treated with antibiotics. I have prescribed Doxycycline  100mg  by mouth twice a day for 7 days. You may use an oral decongestant such as Mucinex D or if you have glaucoma or high blood pressure use plain Mucinex. Saline nasal spray help and can safely be used as often as needed for congestion.  If you develop worsening sinus pain, fever or notice severe headache and vision changes, or if symptoms are not better after completion of antibiotic, please schedule an appointment with a health care provider.    Sinus infections are not as easily transmitted as other respiratory infection, however we still recommend that you avoid close contact with loved ones, especially the very young and elderly.  Remember to wash your hands thoroughly throughout the day as this is the number one way to prevent the spread of infection!  Home Care: Only take medications as instructed by your medical team. Complete the entire course of an antibiotic. Do not take these medications with alcohol. A steam or ultrasonic humidifier can help congestion.  You can place a towel over your head and breathe in the steam from hot water  coming from a faucet. Avoid close contacts especially the very young and the elderly. Cover your mouth when you cough or sneeze. Always remember to wash your hands.  Get Help Right Away If: You develop worsening fever or sinus pain. You develop a severe head ache or visual changes. Your symptoms persist after you have completed your treatment plan.  Make sure you Understand these instructions. Will watch your  condition. Will get help right away if you are not doing well or get worse.  Your e-visit answers were reviewed by a board certified advanced clinical practitioner to complete your personal care plan.  Depending on the condition, your plan could have included both over the counter or prescription medications.  If there is a problem please reply  once you have received a response from your provider.  Your safety is important to us .  If you have drug allergies check your prescription carefully.    You can use MyChart to ask questions about today's visit, request a non-urgent call back, or ask for a work or school excuse for 24 hours related to this e-Visit. If it has been greater than 24 hours you will need to follow up with your provider, or enter a new e-Visit to address those concerns.  You will get an e-mail in the next two days asking about your experience.  I hope that your e-visit has been valuable and will speed your recovery. Thank you for using e-visits.  I have spent 5 minutes in review of e-visit questionnaire, review and updating patient chart, medical decision making and response to patient.   Elsie Velma Lunger, PA-C

## 2024-02-24 ENCOUNTER — Other Ambulatory Visit: Payer: Self-pay | Admitting: Pharmacist

## 2024-02-24 DIAGNOSIS — E1169 Type 2 diabetes mellitus with other specified complication: Secondary | ICD-10-CM

## 2024-02-24 DIAGNOSIS — I2510 Atherosclerotic heart disease of native coronary artery without angina pectoris: Secondary | ICD-10-CM

## 2024-02-24 NOTE — Progress Notes (Signed)
 Would you please send renewal of rosuvastatin  prescription to [facility] for patient?  Note patient last seen for Office Visit with Dr. Gollan on 10/25/2023   Sharyle Sia, PharmD, [facility] [phone]

## 2024-02-24 NOTE — Progress Notes (Signed)
" ° °  02/24/2024  Patient ID: [name], female   DOB: April 05, 1945, 78 y.o.   MRN: [id number]  This patient is appearing on a report for the adherence measure for cholesterol (statin) medications this calendar year.   Medication: rosuvastatin  20 mg Last fill date: 08/24/2023 for 90 day supply  Discuss importance of adherence to rosuvastatin /ASCVD risk reduction.  Patient denies missed doses, rather reports that earlier in the year she received a double shipment of the rosuvastatin  from [employer] [facility]. Reports using weekly pillbox. Reports still has >1 month supply remaining.  Latest prescription is out of refills and has direction Please schedule appointment with cardiology office for future refills However, patient has been seen since for Office Visit with Dr. Gollan on 10/25/2023  - Will collaborate with Cardiology office to request renewal  Lab Results  Component Value Date   CHOL 85 12/23/2023   HDL 44 (L) 12/23/2023   LDLCALC 20 12/23/2023   TRIG 124 12/23/2023   CHOLHDL 1.9 12/23/2023     Sharyle Sia, PharmD, [facility] [phone]  "

## 2024-02-27 MED ORDER — ROSUVASTATIN CALCIUM 20 MG PO TABS: 20.0000 mg | ORAL_TABLET | Freq: Every day | ORAL | 3 refills | Status: AC

## 2024-03-23 LAB — OPHTHALMOLOGY REPORT-SCANNED

## 2024-05-09 ENCOUNTER — Ambulatory Visit: Admitting: Family Medicine

## 2024-07-13 ENCOUNTER — Ambulatory Visit

## 2024-07-18 ENCOUNTER — Ambulatory Visit
